# Patient Record
Sex: Female | Born: 1960 | Race: Black or African American | Hispanic: No | Marital: Single | Smoking: Never smoker
Health system: Southern US, Community
[De-identification: ages and names within clinical notes are randomized; demographics above are authoritative.]

## PROBLEM LIST (undated history)

## (undated) DIAGNOSIS — G629 Polyneuropathy, unspecified: Secondary | ICD-10-CM

## (undated) DIAGNOSIS — R569 Unspecified convulsions: Secondary | ICD-10-CM

## (undated) DIAGNOSIS — F419 Anxiety disorder, unspecified: Secondary | ICD-10-CM

## (undated) DIAGNOSIS — F061 Catatonic disorder due to known physiological condition: Secondary | ICD-10-CM

## (undated) DIAGNOSIS — M797 Fibromyalgia: Secondary | ICD-10-CM

## (undated) DIAGNOSIS — I10 Essential (primary) hypertension: Secondary | ICD-10-CM

## (undated) HISTORY — PX: ABDOMINAL HYSTERECTOMY: SHX81

## (undated) HISTORY — PX: GASTRIC BYPASS: SHX52

---

## 2011-04-08 ENCOUNTER — Ambulatory Visit: Payer: Self-pay | Admitting: Internal Medicine

## 2011-06-01 ENCOUNTER — Emergency Department (HOSPITAL_COMMUNITY): Payer: BC Managed Care – PPO

## 2011-06-01 ENCOUNTER — Encounter: Payer: Self-pay | Admitting: *Deleted

## 2011-06-01 ENCOUNTER — Emergency Department (HOSPITAL_COMMUNITY)
Admission: EM | Admit: 2011-06-01 | Discharge: 2011-06-02 | Disposition: A | Discharge status: Home / Self Care | Payer: BC Managed Care – PPO | Attending: Emergency Medicine | Admitting: Emergency Medicine

## 2011-06-01 DIAGNOSIS — Z79899 Other long term (current) drug therapy: Secondary | ICD-10-CM | POA: Insufficient documentation

## 2011-06-01 DIAGNOSIS — R0602 Shortness of breath: Secondary | ICD-10-CM | POA: Insufficient documentation

## 2011-06-01 DIAGNOSIS — R079 Chest pain, unspecified: Secondary | ICD-10-CM | POA: Insufficient documentation

## 2011-06-01 DIAGNOSIS — I10 Essential (primary) hypertension: Secondary | ICD-10-CM | POA: Insufficient documentation

## 2011-06-01 DIAGNOSIS — R109 Unspecified abdominal pain: Secondary | ICD-10-CM | POA: Insufficient documentation

## 2011-06-01 DIAGNOSIS — F411 Generalized anxiety disorder: Secondary | ICD-10-CM | POA: Insufficient documentation

## 2011-06-01 DIAGNOSIS — R059 Cough, unspecified: Secondary | ICD-10-CM | POA: Insufficient documentation

## 2011-06-01 DIAGNOSIS — Z9884 Bariatric surgery status: Secondary | ICD-10-CM | POA: Insufficient documentation

## 2011-06-01 DIAGNOSIS — R112 Nausea with vomiting, unspecified: Secondary | ICD-10-CM | POA: Insufficient documentation

## 2011-06-01 DIAGNOSIS — IMO0001 Reserved for inherently not codable concepts without codable children: Secondary | ICD-10-CM | POA: Insufficient documentation

## 2011-06-01 DIAGNOSIS — Z9889 Other specified postprocedural states: Secondary | ICD-10-CM | POA: Insufficient documentation

## 2011-06-01 DIAGNOSIS — R05 Cough: Secondary | ICD-10-CM | POA: Insufficient documentation

## 2011-06-01 HISTORY — DX: Polyneuropathy, unspecified: G62.9

## 2011-06-01 HISTORY — DX: Essential (primary) hypertension: I10

## 2011-06-01 HISTORY — DX: Fibromyalgia: M79.7

## 2011-06-01 HISTORY — DX: Anxiety disorder, unspecified: F41.9

## 2011-06-01 LAB — CBC
HCT: 31.9 % — ABNORMAL LOW (ref 36.0–46.0)
MCH: 31.4 pg (ref 26.0–34.0)
MCV: 92.7 fL (ref 78.0–100.0)
RBC: 3.44 MIL/uL — ABNORMAL LOW (ref 3.87–5.11)
WBC: 13 10*3/uL — ABNORMAL HIGH (ref 4.0–10.5)

## 2011-06-01 LAB — DIFFERENTIAL
Eosinophils Relative: 2 % (ref 0–5)
Lymphocytes Relative: 15 % (ref 12–46)
Lymphs Abs: 1.9 10*3/uL (ref 0.7–4.0)
Monocytes Absolute: 1.1 10*3/uL — ABNORMAL HIGH (ref 0.1–1.0)
Monocytes Relative: 8 % (ref 3–12)

## 2011-06-01 LAB — URINALYSIS, ROUTINE W REFLEX MICROSCOPIC
Bilirubin Urine: NEGATIVE
Glucose, UA: NEGATIVE mg/dL
Hgb urine dipstick: NEGATIVE
Ketones, ur: NEGATIVE mg/dL
Protein, ur: NEGATIVE mg/dL

## 2011-06-01 LAB — BASIC METABOLIC PANEL
BUN: 10 mg/dL (ref 6–23)
CO2: 27 mEq/L (ref 19–32)
Calcium: 9 mg/dL (ref 8.4–10.5)
Creatinine, Ser: 0.78 mg/dL (ref 0.50–1.10)
Glucose, Bld: 81 mg/dL (ref 70–99)

## 2011-06-01 LAB — LACTIC ACID, PLASMA: Lactic Acid, Venous: 1.8 mmol/L (ref 0.5–2.2)

## 2011-06-01 MED ORDER — DROPERIDOL 2.5 MG/ML IJ SOLN
2.5000 mg | INTRAMUSCULAR | Status: AC
  Administered 2011-06-01: 2.5 mg via INTRAVENOUS
  Filled 2011-06-01: qty 1

## 2011-06-01 MED ORDER — OXYCODONE-ACETAMINOPHEN 10-325 MG PO TABS: 1.0000 | ORAL_TABLET | ORAL | Status: AC | PRN

## 2011-06-01 MED ORDER — SODIUM CHLORIDE 0.9 % IV BOLUS (SEPSIS)
500.0000 mL | Freq: Once | INTRAVENOUS | Status: AC
  Administered 2011-06-01: 500 mL via INTRAVENOUS

## 2011-06-01 MED ORDER — ONDANSETRON HCL 4 MG/2ML IJ SOLN
4.0000 mg | Freq: Once | INTRAMUSCULAR | Status: AC
  Administered 2011-06-01: 4 mg via INTRAVENOUS
  Filled 2011-06-01: qty 2

## 2011-06-01 MED ORDER — HYDROMORPHONE HCL PF 1 MG/ML IJ SOLN
1.0000 mg | Freq: Once | INTRAMUSCULAR | Status: AC
  Administered 2011-06-01: 1 mg via INTRAVENOUS
  Filled 2011-06-01: qty 1

## 2011-06-01 NOTE — ED Provider Notes (Signed)
History     CSN: [id number]  Arrival date & time 06/01/11  1910   First MD Initiated Contact with Patient 06/01/11 2101      Chief Complaint  Patient presents with  . Abdominal Pain  . Nausea  . Emesis    (Consider location/radiation/quality/duration/timing/severity/associated sxs/prior treatment) HPI Pt reports had surgery on 12-19 to correct gastric bypass surgery. Pt reports she is having upper abdominal pain. States she unable to eat anything, states it comes right back up. Pt reports hx of fibromyalgia and she does not have any medications and states she is having bilateral upper extremity pain. Pt reports she is also having bilateral lower extremity pain, states hx of neuropathy.  Past Medical History  Diagnosis Date  . Peripheral neuropathy   . Fibromyalgia   . Hypertension   . Anxiety     Past Surgical History  Procedure Date  . Gastric bypass   . Abdominal hysterectomy     History reviewed. No pertinent family history.  History  Substance Use Topics  . Smoking status: Never Smoker   . Smokeless tobacco: Not on file  . Alcohol Use: No    OB History    Grav Para Term Preterm Abortions TAB SAB Ect Mult Living                  Review of Systems  All other systems reviewed and are negative.    Allergies  Morphine and related; Paxil; Penicillins; and Zoloft  Home Medications   Current Outpatient Rx  Name Route Sig Dispense Refill  . AMITRIPTYLINE HCL 25 MG PO TABS Oral Take 50 mg by mouth at bedtime.      . ATENOLOL 50 MG PO TABS Oral Take 50 mg by mouth daily.      [name] GABAPENTIN 800 MG PO TABS Oral Take 800 mg by mouth 3 (three) times daily.      . MEGESTROL ACETATE 40 MG/ML PO SUSP Oral Take 200 mg by mouth daily.      [name] PANTOPRAZOLE SODIUM 40 MG PO TBEC Oral Take 40 mg by mouth 2 (two) times daily.      [name] PROMETHAZINE HCL 12.5 MG PO TABS Oral Take 12.5 mg by mouth every 6 (six) hours as needed. For nausea     . OXYCODONE-ACETAMINOPHEN 10-325 MG  PO TABS Oral Take 1 tablet by mouth every 4 (four) hours as needed for pain. 30 tablet 0    BP 121/67  Pulse 131  Temp(Src) 99 F (37.2 C) (Oral)  Resp 18  SpO2 100%  Physical Exam  Nursing note and vitals reviewed. Constitutional: She is oriented to person, place, and time. She appears well-developed and well-nourished. No distress.  HENT:  Head: Normocephalic and atraumatic.  Eyes: Pupils are equal, round, and reactive to light.  Neck: Normal range of motion.  Cardiovascular: Normal rate and intact distal pulses.   Pulmonary/Chest: No respiratory distress.  Abdominal: Normal appearance. She exhibits no distension. There is tenderness. There is no rebound and no guarding.  Musculoskeletal: Normal range of motion.  Neurological: She is alert and oriented to person, place, and time. No cranial nerve deficit.  Skin: Skin is warm and dry. No rash noted.  Psychiatric: She has a normal mood and affect. Her behavior is normal.    ED Course  Procedures (including critical care time) Pt. Turned over to Dr. Dierdre Highman to review films and make disposition appropriately.    Labs Reviewed  CBC - Abnormal; Notable for  the following:    WBC 13.0 (*)    RBC 3.44 (*)    Hemoglobin 10.8 (*)    HCT 31.9 (*)    All other components within normal limits  DIFFERENTIAL - Abnormal; Notable for the following:    Neutro Abs 9.8 (*)    Monocytes Absolute 1.1 (*)    All other components within normal limits  BASIC METABOLIC PANEL  URINALYSIS, ROUTINE W REFLEX MICROSCOPIC  LACTIC ACID, PLASMA  LAB REPORT - SCANNED   IMPRESSION:   1.  Peripheral enhancing fluid collection in the left upper quadrant between the bypassed stomach and left hepatic lobe is likely a postsurgical fluid collection (hematoma, seroma or abscess).  No other fluid collections are identified. 2.  No evidence of bowel obstruction. 3.  No significant visceral organ findings. 4.  Small amount of free pelvic fluid.    1.  Abdominal pain       MDM          Nelia Shi, MD 06/04/11 1345

## 2011-06-01 NOTE — ED Notes (Addendum)
Pt reports had surgery on 12-19 to correct gastric bypass surgery. Pt reports she is having upper abdominal pain. States she unable to eat anything, states it comes right back up. Pt reports hx of fibromyalgia and she does not have any medications and states she is having bilateral upper extremity pain. Pt reports she is also having bilateral lower extremity pain, states hx of neuropathy.

## 2011-06-02 ENCOUNTER — Emergency Department (HOSPITAL_COMMUNITY): Payer: BC Managed Care – PPO

## 2011-06-02 ENCOUNTER — Encounter (HOSPITAL_COMMUNITY): Payer: Self-pay | Admitting: Radiology

## 2011-06-02 MED ORDER — IOHEXOL 300 MG/ML  SOLN
100.0000 mL | Freq: Once | INTRAMUSCULAR | Status: AC | PRN
  Administered 2011-06-02: 100 mL via INTRAVENOUS

## 2011-06-02 MED ORDER — IOHEXOL 300 MG/ML  SOLN
20.0000 mL | Freq: Once | INTRAMUSCULAR | Status: AC | PRN
  Administered 2011-06-02: 20 mL via ORAL

## 2011-06-02 NOTE — ED Provider Notes (Signed)
History     CSN: [id number]  Arrival date & time 06/01/11  1910   First MD Initiated Contact with Patient 06/01/11 2101      Chief Complaint  Patient presents with  . Abdominal Pain  . Nausea  . Emesis    (Consider location/radiation/quality/duration/timing/severity/associated sxs/prior treatment) HPI  Past Medical History  Diagnosis Date  . Peripheral neuropathy   . Fibromyalgia   . Hypertension   . Anxiety     Past Surgical History  Procedure Date  . Gastric bypass   . Abdominal hysterectomy     History reviewed. No pertinent family history.  History  Substance Use Topics  . Smoking status: Never Smoker   . Smokeless tobacco: Not on file  . Alcohol Use: No    OB History    Grav Para Term Preterm Abortions TAB SAB Ect Mult Living                  Review of Systems  Allergies  Morphine and related; Paxil; Penicillins; and Zoloft  Home Medications   Current Outpatient Rx  Name Route Sig Dispense Refill  . AMITRIPTYLINE HCL 25 MG PO TABS Oral Take 50 mg by mouth at bedtime.      . ATENOLOL 50 MG PO TABS Oral Take 50 mg by mouth daily.      [name] GABAPENTIN 800 MG PO TABS Oral Take 800 mg by mouth 3 (three) times daily.      . MEGESTROL ACETATE 40 MG/ML PO SUSP Oral Take 200 mg by mouth daily.      [name] PANTOPRAZOLE SODIUM 40 MG PO TBEC Oral Take 40 mg by mouth 2 (two) times daily.      [name] PROMETHAZINE HCL 12.5 MG PO TABS Oral Take 12.5 mg by mouth every 6 (six) hours as needed. For nausea     . OXYCODONE-ACETAMINOPHEN 10-325 MG PO TABS Oral Take 1 tablet by mouth every 4 (four) hours as needed for pain. 30 tablet 0    BP 121/67  Pulse 131  Temp(Src) 99 F (37.2 C) (Oral)  Resp 18  SpO2 100%  Physical Exam  ED Course  Procedures (including critical care time)  Labs Reviewed  CBC - Abnormal; Notable for the following:    WBC 13.0 (*)    RBC 3.44 (*)    Hemoglobin 10.8 (*)    HCT 31.9 (*)    All other components within normal limits  DIFFERENTIAL  - Abnormal; Notable for the following:    Neutro Abs 9.8 (*)    Monocytes Absolute 1.1 (*)    All other components within normal limits  BASIC METABOLIC PANEL  URINALYSIS, ROUTINE W REFLEX MICROSCOPIC  LACTIC ACID, PLASMA   Ct Abdomen Pelvis W Contrast  06/02/2011  *RADIOLOGY REPORT*  Clinical Data: Abdominal pain.  The patient reports surgery 05/18/2011 for revision of gastric bypass.  CT ABDOMEN AND PELVIS WITH CONTRAST  Technique:  Multidetector CT imaging of the abdomen and pelvis was performed following the standard protocol during bolus administration of intravenous contrast.  Contrast: OMNIPAQUE IOHEXOL 300 MG/ML IV SOLN  Comparison: Acute abdominal series 06/01/2011  Findings: Minimal nodularity is noted anterior to the left major fissure on image 1.  The lung bases are otherwise clear.  There is no pleural effusion.  There are postsurgical changes in the left upper quadrant consistent with gastric bypass.  The stomach demonstrates mild wall thickening.  There is an irregular fluid collection between the stomach and left hepatic  lobe which measures 3.4 x 2.2 cm on image 23.  This demonstrates irregular enhancing margins but no air bubbles or enteric contrast.  No other intra-abdominal fluid collections are seen.  The liver itself demonstrates no abnormalities.  There is minimal biliary prominence status post cholecystectomy.  The spleen, pancreas and adrenal glands appear normal.  The kidneys appear normal.  Moderate stool is present throughout the colon.  The appendix appears normal.  A small amount of free pelvic fluid is within physiologic limits.  There is no adnexal mass status post hysterectomy.  The bladder appears normal.  There are no acute osseous findings.  Mild facet degenerative changes are present within the lower lumbar spine.  There are paraspinal osteophytes in the lower thoracic spine.  IMPRESSION:  1.  Peripheral enhancing fluid collection in the left upper quadrant between  the bypassed stomach and left hepatic lobe is likely a postsurgical fluid collection (hematoma, seroma or abscess).  No other fluid collections are identified. 2.  No evidence of bowel obstruction. 3.  No significant visceral organ findings. 4.  Small amount of free pelvic fluid.  Original Report Authenticated By: Gerrianne Scale, M.D.   Dg Abd Acute W/chest  06/01/2011  *RADIOLOGY REPORT*  Clinical Data: Shortness of breath, chest pain, cough, and abdominal pain for 3 days.  Abdominal pain, vomiting, and diarrhea since the abdominal surgery on December 19.  ACUTE ABDOMEN SERIES (ABDOMEN 2 VIEW & CHEST 1 VIEW)  Comparison: None.  Findings: The heart size and pulmonary vascularity are normal. The lungs appear clear and expanded without focal air space disease or consolidation. No blunting of the costophrenic angles.  Stool filled colon without distension.  Scattered gas within nondistended small bowel loops, likely to represent ileus.  No significant small bowel dilatation.  No evidence of free intra- abdominal air.  There is a small rounded gas collection in the right upper quadrant with a small air-fluid level, likely representing a duodenal bulb.  Surgical clips in the right upper quadrant and epigastric region.  Degenerative changes in the lumbar spine and hips.  IMPRESSION: Postoperative changes in the upper abdomen.  Scattered gas in nondistended small bowel probably representing ileus.  No evidence of obstruction or free air.  Original Report Authenticated By: Marlon Pel, M.D.     1. Abdominal pain       MDM   I assumed patient care at 12 AM from Dr. Radford Pax pending CT scan.   Brief history: Patient had gastric bypass done remote past and had a surgical revision performed 05/18/2011. Since 05/24/2011 she's had persistent pain and vomiting after everything she eats. Her surgery was performed at [facility] in [place]. She is living here with family in [place] currently. She spoke  with the PA per surgeon today who recommended she come in emergency department. She says she has felt feverish but no recorded temperature. No vomiting in the emergency department.  On exam she is mildly tender in the epigastric region. She has some healing lap scars consistent with reported surgery. No abdominal peritonitis.  No erythema or ecchymosis abdomen. Mildly dry mucous membranes. Mild tachycardia. Normal lung sounds.  2:56 AM CT scan reviewed as above. I discussed the case as above with Dr. Donell Beers on call for general surgery who recommends consultation with patient surgeon in [place].   3:38 AM Case discussed with Dr. Geraldo Pitter at [facility] on call for Dr. Cheryle Horsfall, who performed patient surgery at [facility]. He reviewed the patient's record with  me. She had a reversal of her gastric bypass. She had a swallow study afterwards with no leak. She does have a psych history. Of note she has no psychosis or active psychiatric issues at this time. Dr. Geraldo Pitter recommended that she is able to tolerate fluids and does not have acute abdomen at this time and follow up with Dr. Cheryle Horsfall this week. [facility] is currently without beds, but would accept in transfer when able if patient needs to be transferred. Patient is scheduled to followup with her surgeon this week.  I discussed this as above with the patient. She tolerates by mouth water no acute distress. She has minimal symptoms at this time. She agrees to followup this week. Has no peritonitis on serial exams. Given her clinical condition at this time, I feel she can be safely discharged home with pain medications as prescribed by Dr. Radford Pax and followup this week in clinic. Her son is bedside agrees to all discharge and followup instructions and patient agrees as well. Return precautions verbalized is understood.        Sunnie Nielsen, MD 06/02/11 [phone]

## 2011-06-02 NOTE — ED Notes (Signed)
Patient transported to CT 

## 2011-06-02 NOTE — ED Notes (Signed)
Patient returned from CT

## 2012-02-22 ENCOUNTER — Ambulatory Visit: Payer: BC Managed Care – PPO | Attending: Neurology | Admitting: *Deleted

## 2012-02-22 DIAGNOSIS — IMO0001 Reserved for inherently not codable concepts without codable children: Secondary | ICD-10-CM | POA: Insufficient documentation

## 2012-02-22 DIAGNOSIS — R269 Unspecified abnormalities of gait and mobility: Secondary | ICD-10-CM | POA: Insufficient documentation

## 2012-02-22 DIAGNOSIS — M6281 Muscle weakness (generalized): Secondary | ICD-10-CM | POA: Insufficient documentation

## 2012-02-29 ENCOUNTER — Ambulatory Visit: Payer: BC Managed Care – PPO | Attending: Neurology | Admitting: *Deleted

## 2012-02-29 DIAGNOSIS — R269 Unspecified abnormalities of gait and mobility: Secondary | ICD-10-CM | POA: Insufficient documentation

## 2012-02-29 DIAGNOSIS — M6281 Muscle weakness (generalized): Secondary | ICD-10-CM | POA: Insufficient documentation

## 2012-02-29 DIAGNOSIS — IMO0001 Reserved for inherently not codable concepts without codable children: Secondary | ICD-10-CM | POA: Insufficient documentation

## 2012-03-12 ENCOUNTER — Ambulatory Visit: Payer: BC Managed Care – PPO | Admitting: Physical Therapy

## 2012-03-16 ENCOUNTER — Ambulatory Visit: Payer: BC Managed Care – PPO | Admitting: *Deleted

## 2012-03-21 ENCOUNTER — Ambulatory Visit: Payer: BC Managed Care – PPO | Admitting: *Deleted

## 2012-03-23 ENCOUNTER — Ambulatory Visit: Payer: BC Managed Care – PPO | Admitting: *Deleted

## 2012-03-26 ENCOUNTER — Ambulatory Visit: Payer: BC Managed Care – PPO | Admitting: *Deleted

## 2012-03-28 ENCOUNTER — Ambulatory Visit: Payer: BC Managed Care – PPO | Admitting: *Deleted

## 2012-03-30 ENCOUNTER — Ambulatory Visit: Payer: BC Managed Care – PPO | Admitting: *Deleted

## 2012-04-04 ENCOUNTER — Ambulatory Visit: Payer: BC Managed Care – PPO | Admitting: *Deleted

## 2012-04-09 ENCOUNTER — Ambulatory Visit: Payer: BC Managed Care – PPO | Attending: Neurology | Admitting: *Deleted

## 2012-04-09 DIAGNOSIS — IMO0001 Reserved for inherently not codable concepts without codable children: Secondary | ICD-10-CM | POA: Insufficient documentation

## 2012-04-09 DIAGNOSIS — R269 Unspecified abnormalities of gait and mobility: Secondary | ICD-10-CM | POA: Insufficient documentation

## 2012-04-09 DIAGNOSIS — M6281 Muscle weakness (generalized): Secondary | ICD-10-CM | POA: Insufficient documentation

## 2012-04-11 ENCOUNTER — Ambulatory Visit: Payer: BC Managed Care – PPO | Admitting: *Deleted

## 2012-04-13 ENCOUNTER — Ambulatory Visit: Payer: BC Managed Care – PPO | Admitting: *Deleted

## 2012-04-16 ENCOUNTER — Ambulatory Visit: Payer: BC Managed Care – PPO | Admitting: *Deleted

## 2012-04-17 ENCOUNTER — Other Ambulatory Visit: Payer: Self-pay | Admitting: Internal Medicine

## 2012-04-17 DIAGNOSIS — Z1231 Encounter for screening mammogram for malignant neoplasm of breast: Secondary | ICD-10-CM

## 2012-04-20 ENCOUNTER — Ambulatory Visit: Payer: BC Managed Care – PPO | Admitting: *Deleted

## 2012-04-23 ENCOUNTER — Ambulatory Visit: Payer: BC Managed Care – PPO | Admitting: Physical Therapy

## 2012-04-23 ENCOUNTER — Ambulatory Visit: Payer: BC Managed Care – PPO

## 2012-04-24 ENCOUNTER — Ambulatory Visit
Admission: RE | Admit: 2012-04-24 | Discharge: 2012-04-24 | Disposition: A | Discharge status: Home / Self Care | Payer: BC Managed Care – PPO | Source: Ambulatory Visit | Attending: Internal Medicine | Admitting: Internal Medicine

## 2012-04-24 DIAGNOSIS — Z1231 Encounter for screening mammogram for malignant neoplasm of breast: Secondary | ICD-10-CM

## 2012-04-25 ENCOUNTER — Ambulatory Visit: Payer: BC Managed Care – PPO | Admitting: *Deleted

## 2012-05-01 ENCOUNTER — Ambulatory Visit: Payer: BC Managed Care – PPO | Admitting: *Deleted

## 2012-05-02 ENCOUNTER — Ambulatory Visit: Payer: BC Managed Care – PPO | Attending: Neurology | Admitting: Physical Therapy

## 2012-05-02 DIAGNOSIS — IMO0001 Reserved for inherently not codable concepts without codable children: Secondary | ICD-10-CM | POA: Insufficient documentation

## 2012-05-02 DIAGNOSIS — M6281 Muscle weakness (generalized): Secondary | ICD-10-CM | POA: Insufficient documentation

## 2012-05-02 DIAGNOSIS — R269 Unspecified abnormalities of gait and mobility: Secondary | ICD-10-CM | POA: Insufficient documentation

## 2012-05-04 ENCOUNTER — Ambulatory Visit: Payer: BC Managed Care – PPO | Admitting: *Deleted

## 2012-05-09 ENCOUNTER — Ambulatory Visit: Payer: BC Managed Care – PPO | Admitting: *Deleted

## 2012-05-11 ENCOUNTER — Ambulatory Visit: Payer: BC Managed Care – PPO | Admitting: *Deleted

## 2012-05-14 ENCOUNTER — Ambulatory Visit: Payer: BC Managed Care – PPO | Admitting: *Deleted

## 2012-05-18 ENCOUNTER — Ambulatory Visit: Payer: BC Managed Care – PPO | Admitting: *Deleted

## 2012-05-28 ENCOUNTER — Ambulatory Visit: Payer: BC Managed Care – PPO | Admitting: Physical Therapy

## 2012-05-29 ENCOUNTER — Ambulatory Visit: Payer: BC Managed Care – PPO | Admitting: Physical Therapy

## 2012-06-04 ENCOUNTER — Ambulatory Visit: Payer: BC Managed Care – PPO | Attending: Neurology | Admitting: Physical Therapy

## 2012-06-04 DIAGNOSIS — IMO0001 Reserved for inherently not codable concepts without codable children: Secondary | ICD-10-CM | POA: Insufficient documentation

## 2012-06-04 DIAGNOSIS — M6281 Muscle weakness (generalized): Secondary | ICD-10-CM | POA: Insufficient documentation

## 2012-06-04 DIAGNOSIS — R269 Unspecified abnormalities of gait and mobility: Secondary | ICD-10-CM | POA: Insufficient documentation

## 2012-06-06 ENCOUNTER — Ambulatory Visit: Payer: BC Managed Care – PPO | Admitting: Physical Therapy

## 2012-08-21 ENCOUNTER — Telehealth: Payer: Self-pay | Admitting: Neurology

## 2012-08-21 NOTE — Telephone Encounter (Addendum)
Pt calling about her forms. I have not seen any forms with her name come at my desk.   Will forward message to MR. I called and spoke to pt and also the [employer] about her case. See other note.

## 2012-10-02 ENCOUNTER — Telehealth: Payer: Self-pay

## 2012-10-02 ENCOUNTER — Telehealth: Payer: Self-pay | Admitting: Neurology

## 2012-10-02 MED ORDER — GABAPENTIN 300 MG PO CAPS: 900.0000 mg | ORAL_CAPSULE | Freq: Three times a day (TID) | ORAL | Status: DC

## 2012-10-02 MED ORDER — TOPIRAMATE 50 MG PO TABS: 100.0000 mg | ORAL_TABLET | Freq: Two times a day (BID) | ORAL | Status: DC

## 2012-10-02 NOTE — Telephone Encounter (Signed)
Rx has been sent.  I called patient.  She is aware rx was sent.

## 2012-10-02 NOTE — Telephone Encounter (Signed)
Patient called back requesting her Topamax be sent to [facility]

## 2012-10-16 ENCOUNTER — Ambulatory Visit: Payer: Self-pay | Admitting: Neurology

## 2012-10-31 ENCOUNTER — Telehealth: Payer: Self-pay | Admitting: *Deleted

## 2012-11-05 ENCOUNTER — Ambulatory Visit: Payer: Self-pay | Admitting: Neurology

## 2013-03-06 ENCOUNTER — Ambulatory Visit: Payer: Self-pay | Admitting: Neurology

## 2013-04-05 ENCOUNTER — Telehealth: Payer: Self-pay | Admitting: *Deleted

## 2013-04-05 NOTE — Telephone Encounter (Signed)
I tried calling the Joan [name] to let her know her appointment has been cancelled..No one answered Ieft voice mail letting Joan [name] know her appointment was cancelled. So if she calls in please schedule her for Dr. Pearlean Brownie.

## 2013-04-17 ENCOUNTER — Ambulatory Visit: Payer: Self-pay | Admitting: Neurology

## 2013-05-08 ENCOUNTER — Ambulatory Visit: Payer: Self-pay | Admitting: Neurology

## 2013-05-11 ENCOUNTER — Emergency Department (HOSPITAL_COMMUNITY): Payer: Medicare Other

## 2013-05-11 ENCOUNTER — Encounter (HOSPITAL_COMMUNITY): Payer: Self-pay | Admitting: Emergency Medicine

## 2013-05-11 ENCOUNTER — Inpatient Hospital Stay (HOSPITAL_COMMUNITY)
Admission: EM | Admit: 2013-05-11 | Discharge: 2013-05-27 | DRG: 871 | Disposition: A | Discharge status: Skilled Nursing Facility | Payer: Medicare Other | Attending: Internal Medicine | Admitting: Internal Medicine

## 2013-05-11 DIAGNOSIS — A419 Sepsis, unspecified organism: Principal | ICD-10-CM

## 2013-05-11 DIAGNOSIS — R4182 Altered mental status, unspecified: Secondary | ICD-10-CM

## 2013-05-11 DIAGNOSIS — G0490 Encephalitis and encephalomyelitis, unspecified: Secondary | ICD-10-CM

## 2013-05-11 DIAGNOSIS — E870 Hyperosmolality and hypernatremia: Secondary | ICD-10-CM

## 2013-05-11 DIAGNOSIS — N12 Tubulo-interstitial nephritis, not specified as acute or chronic: Secondary | ICD-10-CM | POA: Diagnosis present

## 2013-05-11 DIAGNOSIS — I959 Hypotension, unspecified: Secondary | ICD-10-CM | POA: Diagnosis not present

## 2013-05-11 DIAGNOSIS — Z9884 Bariatric surgery status: Secondary | ICD-10-CM

## 2013-05-11 DIAGNOSIS — E46 Unspecified protein-calorie malnutrition: Secondary | ICD-10-CM | POA: Diagnosis present

## 2013-05-11 DIAGNOSIS — G9340 Encephalopathy, unspecified: Secondary | ICD-10-CM

## 2013-05-11 DIAGNOSIS — A048 Other specified bacterial intestinal infections: Secondary | ICD-10-CM

## 2013-05-11 DIAGNOSIS — Z9181 History of falling: Secondary | ICD-10-CM

## 2013-05-11 DIAGNOSIS — R569 Unspecified convulsions: Secondary | ICD-10-CM

## 2013-05-11 DIAGNOSIS — I10 Essential (primary) hypertension: Secondary | ICD-10-CM | POA: Diagnosis present

## 2013-05-11 DIAGNOSIS — G929 Unspecified toxic encephalopathy: Secondary | ICD-10-CM | POA: Diagnosis present

## 2013-05-11 DIAGNOSIS — Z79899 Other long term (current) drug therapy: Secondary | ICD-10-CM

## 2013-05-11 DIAGNOSIS — A0472 Enterocolitis due to Clostridium difficile, not specified as recurrent: Secondary | ICD-10-CM | POA: Diagnosis not present

## 2013-05-11 DIAGNOSIS — E872 Acidosis, unspecified: Secondary | ICD-10-CM | POA: Diagnosis present

## 2013-05-11 DIAGNOSIS — Z23 Encounter for immunization: Secondary | ICD-10-CM

## 2013-05-11 DIAGNOSIS — G92 Toxic encephalopathy: Secondary | ICD-10-CM | POA: Diagnosis present

## 2013-05-11 DIAGNOSIS — E860 Dehydration: Secondary | ICD-10-CM

## 2013-05-11 DIAGNOSIS — E876 Hypokalemia: Secondary | ICD-10-CM | POA: Diagnosis not present

## 2013-05-11 DIAGNOSIS — N179 Acute kidney failure, unspecified: Secondary | ICD-10-CM

## 2013-05-11 DIAGNOSIS — N390 Urinary tract infection, site not specified: Secondary | ICD-10-CM

## 2013-05-11 DIAGNOSIS — K5900 Constipation, unspecified: Secondary | ICD-10-CM | POA: Diagnosis not present

## 2013-05-11 DIAGNOSIS — IMO0001 Reserved for inherently not codable concepts without codable children: Secondary | ICD-10-CM | POA: Diagnosis present

## 2013-05-11 HISTORY — DX: Catatonic disorder due to known physiological condition: F06.1

## 2013-05-11 LAB — URINALYSIS, ROUTINE W REFLEX MICROSCOPIC
Glucose, UA: NEGATIVE mg/dL
Hgb urine dipstick: NEGATIVE
Protein, ur: NEGATIVE mg/dL
Specific Gravity, Urine: 1.024 (ref 1.005–1.030)
Urobilinogen, UA: 1 mg/dL (ref 0.0–1.0)

## 2013-05-11 LAB — BLOOD GAS, ARTERIAL
Drawn by: 40405
FIO2: 0.21 %
O2 Saturation: 99.1 %
Patient temperature: 98.6
pH, Arterial: 7.433 (ref 7.350–7.450)
pO2, Arterial: 160 mmHg — ABNORMAL HIGH (ref 80.0–100.0)

## 2013-05-11 LAB — POCT I-STAT, CHEM 8
BUN: 51 mg/dL — ABNORMAL HIGH (ref 6–23)
Calcium, Ion: 1.09 mmol/L — ABNORMAL LOW (ref 1.12–1.23)
Chloride: 111 mEq/L (ref 96–112)
Creatinine, Ser: 2 mg/dL — ABNORMAL HIGH (ref 0.50–1.10)
HCT: 42 % (ref 36.0–46.0)
Sodium: 149 mEq/L — ABNORMAL HIGH (ref 135–145)
TCO2: 20 mmol/L (ref 0–100)

## 2013-05-11 LAB — CBC
HCT: 41.9 % (ref 36.0–46.0)
MCH: 30.7 pg (ref 26.0–34.0)
MCHC: 34.1 g/dL (ref 30.0–36.0)
MCV: 89.9 fL (ref 78.0–100.0)
RBC: 4.66 MIL/uL (ref 3.87–5.11)
RDW: 13.6 % (ref 11.5–15.5)
WBC: 8.8 10*3/uL (ref 4.0–10.5)

## 2013-05-11 LAB — URINE MICROSCOPIC-ADD ON

## 2013-05-11 LAB — POCT I-STAT TROPONIN I

## 2013-05-11 LAB — COMPREHENSIVE METABOLIC PANEL
AST: 38 U/L — ABNORMAL HIGH (ref 0–37)
Albumin: 4 g/dL (ref 3.5–5.2)
BUN: 55 mg/dL — ABNORMAL HIGH (ref 6–23)
CO2: 20 mEq/L (ref 19–32)
Calcium: 9.7 mg/dL (ref 8.4–10.5)
Creatinine, Ser: 1.8 mg/dL — ABNORMAL HIGH (ref 0.50–1.10)
GFR calc non Af Amer: 31 mL/min — ABNORMAL LOW (ref >90)
Total Protein: 8.6 g/dL — ABNORMAL HIGH (ref 6.0–8.3)

## 2013-05-11 LAB — RAPID URINE DRUG SCREEN, HOSP PERFORMED
Amphetamines: NOT DETECTED
Cocaine: NOT DETECTED
Opiates: NOT DETECTED
Tetrahydrocannabinol: NOT DETECTED

## 2013-05-11 LAB — DIFFERENTIAL
Basophils Absolute: 0 10*3/uL (ref 0.0–0.1)
Basophils Relative: 0 % (ref 0–1)
Eosinophils Relative: 0 % (ref 0–5)
Monocytes Absolute: 1.3 10*3/uL — ABNORMAL HIGH (ref 0.1–1.0)
Monocytes Relative: 15 % — ABNORMAL HIGH (ref 3–12)

## 2013-05-11 LAB — ETHANOL: Alcohol, Ethyl (B): <11 mg/dL (ref 0–11)

## 2013-05-11 LAB — GLUCOSE, CAPILLARY: Glucose-Capillary: 116 mg/dL — ABNORMAL HIGH (ref 70–99)

## 2013-05-11 LAB — LACTIC ACID, PLASMA: Lactic Acid, Venous: 5 mmol/L — ABNORMAL HIGH (ref 0.5–2.2)

## 2013-05-11 LAB — TROPONIN I: Troponin I: <0.3 ng/mL (ref <0.30)

## 2013-05-11 MED ORDER — SODIUM CHLORIDE 0.9 % IV BOLUS (SEPSIS)
2000.0000 mL | Freq: Once | INTRAVENOUS | Status: AC
  Administered 2013-05-11: 2000 mL via INTRAVENOUS

## 2013-05-11 MED ORDER — ACETAMINOPHEN 650 MG RE SUPP: 650.0000 mg | Freq: Four times a day (QID) | RECTAL | Status: DC | PRN

## 2013-05-11 MED ORDER — SODIUM CHLORIDE 0.9 % IV SOLN
INTRAVENOUS | Status: DC
  Administered 2013-05-11 – 2013-05-12 (×2): via INTRAVENOUS

## 2013-05-11 MED ORDER — OXYCODONE HCL 5 MG PO TABS: 5.0000 mg | ORAL_TABLET | ORAL | Status: DC | PRN

## 2013-05-11 MED ORDER — PANTOPRAZOLE SODIUM 40 MG IV SOLR
40.0000 mg | Freq: Two times a day (BID) | INTRAVENOUS | Status: DC
  Administered 2013-05-11 – 2013-05-18 (×14): 40 mg via INTRAVENOUS
  Filled 2013-05-11 (×19): qty 40

## 2013-05-11 MED ORDER — ONDANSETRON HCL 4 MG PO TABS: 4.0000 mg | ORAL_TABLET | Freq: Four times a day (QID) | ORAL | Status: DC | PRN

## 2013-05-11 MED ORDER — SODIUM CHLORIDE 0.9 % IV SOLN: INTRAVENOUS | Status: DC

## 2013-05-11 MED ORDER — ONDANSETRON HCL 4 MG/2ML IJ SOLN: 4.0000 mg | Freq: Four times a day (QID) | INTRAMUSCULAR | Status: DC | PRN

## 2013-05-11 MED ORDER — ACETAMINOPHEN 325 MG PO TABS: 650.0000 mg | ORAL_TABLET | Freq: Four times a day (QID) | ORAL | Status: DC | PRN

## 2013-05-11 MED ORDER — ENOXAPARIN SODIUM 30 MG/0.3ML ~~LOC~~ SOLN
30.0000 mg | SUBCUTANEOUS | Status: DC
  Administered 2013-05-12 – 2013-05-13 (×2): 30 mg via SUBCUTANEOUS
  Filled 2013-05-11 (×2): qty 0.3

## 2013-05-11 MED ORDER — ZOLPIDEM TARTRATE 5 MG PO TABS: 5.0000 mg | ORAL_TABLET | Freq: Every evening | ORAL | Status: DC | PRN

## 2013-05-11 MED ORDER — SODIUM CHLORIDE 0.9 % IJ SOLN
INTRAMUSCULAR | Status: AC
  Filled 2013-05-11: qty 10

## 2013-05-11 MED ORDER — ALUM & MAG HYDROXIDE-SIMETH 200-200-20 MG/5ML PO SUSP: 30.0000 mL | Freq: Four times a day (QID) | ORAL | Status: DC | PRN

## 2013-05-11 MED ORDER — SODIUM CHLORIDE 0.9 % IV SOLN
INTRAVENOUS | Status: DC
  Administered 2013-05-11 – 2013-05-13 (×3): via INTRAVENOUS

## 2013-05-11 MED ORDER — DEXTROSE 5 % IV SOLN
1.0000 g | Freq: Once | INTRAVENOUS | Status: AC
  Administered 2013-05-11: 1 g via INTRAVENOUS
  Filled 2013-05-11: qty 10

## 2013-05-11 MED ORDER — DEXTROSE 5 % IV SOLN
1.0000 g | INTRAVENOUS | Status: DC
  Administered 2013-05-12 – 2013-05-17 (×4): 1 g via INTRAVENOUS
  Filled 2013-05-11 (×8): qty 10

## 2013-05-11 MED ORDER — HYDROMORPHONE HCL PF 1 MG/ML IJ SOLN
0.5000 mg | INTRAMUSCULAR | Status: DC | PRN
  Administered 2013-05-16 – 2013-05-17 (×2): 0.5 mg via INTRAVENOUS
  Administered 2013-05-27: 1 mg via INTRAVENOUS
  Filled 2013-05-11 (×3): qty 1

## 2013-05-11 MED ORDER — ACETAMINOPHEN 650 MG RE SUPP
975.0000 mg | Freq: Once | RECTAL | Status: AC
  Administered 2013-05-11: 975 mg via RECTAL
  Filled 2013-05-11 (×2): qty 1

## 2013-05-11 NOTE — ED Notes (Signed)
Patient presents to ED via EMS from home. Family called EMS due to altered mental status for a couple days per EMS.

## 2013-05-11 NOTE — ED Notes (Signed)
Admitting at bedside 

## 2013-05-11 NOTE — H&P (Addendum)
[facility] History and Physical  Joan [name] RUE:[id number] DOB: 1960/10/24 DOA: 05/11/2013  Referring physician:  EDP PCP: No primary provider on file.  Specialists:   Chief Complaint:  Decreased Responsiveness  HPI: Joan [name] is a 52 y.o. female who was brought to the ED after having decreased responsiveness for the past 2-3 days.  She is unresponsive at this time and her son gives the history.  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  He states that she has not had any food or liquids during this time as well.   She was diagnosed with a UTI on 04/05/2013 and was prescribed Bactrim therapy but she did not take the medication due to increased nausea and vomiting.   Her son also reports that prior to this she had been falling down a lot and has multiple bruises from her falls.      In the ED, she was evaluated and found to be minimally responsive and febrile.   She was found to have a UTI, and and increased lactic Acid level of 5, and was placed on IV Rocephin and IVFs for rehydration.  A CT scan of the head was performed and was found to be negative for acute findings.   She was referred for admission.      Review of Systems: The patient denies anorexia, fever, chills, headaches, weight loss, vision loss, diplopia, dizziness, decreased hearing, rhinitis, hoarseness, chest pain, syncope, dyspnea on exertion, peripheral edema, balance deficits, cough, hemoptysis, abdominal pain, nausea, vomiting, diarrhea, constipation, hematemesis, melena, hematochezia, severe indigestion/heartburn, dysuria, hematuria, incontinence, muscle weakness, suspicious skin lesions, transient blindness, difficulty walking, depression, unusual weight change, abnormal bleeding, enlarged lymph nodes, angioedema, and breast masses.    Past Medical History  Diagnosis Date  . Peripheral neuropathy   . Fibromyalgia   . Hypertension   . Anxiety   . Catatonia      Past Surgical History  Procedure Laterality Date  . Gastric bypass    . Abdominal hysterectomy      Prior to Admission medications   Medication Sig Start Date End Date Taking? Authorizing Provider  amitriptyline (ELAVIL) 25 MG tablet Take 50 mg by mouth at bedtime.     Yes Historical Provider, MD  atenolol (TENORMIN) 50 MG tablet Take 50 mg by mouth daily.     Yes Historical Provider, MD  buPROPion (WELLBUTRIN XL) 150 MG 24 hr tablet Take 150 mg by mouth every morning.   Yes Historical Provider, MD  buPROPion (WELLBUTRIN XL) 300 MG 24 hr tablet Take 300 mg by mouth daily.   Yes Historical Provider, MD  clonazePAM (KLONOPIN) 1 MG tablet Take 0.5-1 mg by mouth 2 (two) times daily. Takes 1mg  in the morning and 0.5mg  midday   Yes Historical Provider, MD  dimenhyDRINATE (DRAMAMINE) 50 MG tablet Take 50 mg by mouth every 8 (eight) hours as needed for nausea or dizziness.   Yes Historical Provider, MD  diphenhydrAMINE (BENADRYL) 25 mg capsule Take 25 mg by mouth every 6 (six) hours as needed for allergies.   Yes Historical Provider, MD  doxylamine, Sleep, (UNISOM) 25 MG tablet Take 25 mg by mouth at bedtime as needed for sleep.   Yes Historical Provider, MD  fluticasone (CUTIVATE) 0.05 % cream Apply 1 application topically 2 (two) times daily.   Yes Historical Provider, MD  hydrocortisone 2.5 % ointment Apply 1 application topically 2 (two) times daily as needed (for itching).  Yes Historical Provider, MD  hyoscyamine (LEVBID) 0.375 MG 12 hr tablet Take 0.375 mg by mouth 2 (two) times daily.   Yes Historical Provider, MD  loperamide (IMODIUM) 2 MG capsule Take 2 mg by mouth daily as needed for diarrhea or loose stools.   Yes Historical Provider, MD  pantoprazole (PROTONIX) 40 MG tablet Take 40 mg by mouth daily.    Yes Historical Provider, MD  promethazine (PHENERGAN) 25 MG tablet Take 25 mg by mouth every 4 (four) hours as needed for nausea or vomiting.   Yes Historical Provider, MD   sulfamethoxazole-trimethoprim (BACTRIM DS) 800-160 MG per tablet Take 1 tablet by mouth 2 (two) times daily.   Yes Historical Provider, MD  temazepam (RESTORIL) 15 MG capsule Take 15 mg by mouth at bedtime.   Yes Historical Provider, MD  topiramate (TOPAMAX) 50 MG tablet Take 100 mg by mouth 2 (two) times daily.   Yes Historical Provider, MD  gabapentin (NEURONTIN) 300 MG capsule Take 3 capsules (900 mg total) by mouth 3 (three) times daily. 10/02/12   Micki Riley, MD  megestrol (MEGACE) 40 MG/ML suspension Take 200 mg by mouth daily.      Historical Provider, MD    Allergies  Allergen Reactions  . Morphine And Related     Causes pain  . Paroxetine Hcl   . Penicillins Other (See Comments)    Makes her body hurt.  . Sertraline Hcl     Social History:  reports that she has never smoked. She does not have any smokeless tobacco history on file. She reports that she does not drink alcohol. Her drug history is not on file.     Family History  Problem Relation Age of Onset  . Hypertension Mother   . Diabetes Father      Physical Exam:  GEN: Obtunded Obese 52 y.o. African American  female  examined  and in no acute distress; Filed Vitals:   05/11/13 1710 05/11/13 1915 05/11/13 2000  BP: 89/68 129/93 107/80  Pulse: 124 110   Temp: 101.4 F (38.6 C)    TempSrc: Rectal    Resp: 19 32 27  SpO2: 100% 100%    Blood pressure 107/80, pulse 110, temperature 101.4 F (38.6 C), temperature source Rectal, resp. rate 27, SpO2 100.00%. PSYCH: She is alert and oriented x1; Minimally responsive at this time HEENT: Normocephalic and Atraumatic, Mucous membranes pink; PERRLA; EOM intact; Fundi:  Benign;  No scleral icterus, Nares: Patent, Oropharynx:  Dry,  or Fair Dentition, Neck:  FROM, no cervical lymphadenopathy nor thyromegaly or carotid bruit; no JVD; Breasts:: Not examined CHEST WALL: No tenderness CHEST: Normal respiration, clear to auscultation bilaterally HEART: Regular rate and  rhythm; no murmurs rubs or gallops BACK: No kyphosis or scoliosis; no CVA tenderness ABDOMEN: Positive Bowel Sounds, Obese, soft non-tender; no masses, no organomegaly. Rectal Exam: Not done EXTREMITIES: No cyanosis, clubbing or edema; no ulcerations. Genitalia: not examined PULSES: 2+ and symmetric SKIN: Normal hydration no rash or ulceration CNS: Obtunded,  Cranial nerves 2-12 grossly intact,  Minimally responsive to verbal and tactile stimuli.       Labs on Admission:  Basic Metabolic Panel:  Recent Labs Lab 05/11/13 1755 05/11/13 1802  NA 149* 149*  K 3.6 3.5  CL 107 111  CO2 20  --   GLUCOSE 118* 120*  BUN 55* 51*  CREATININE 1.80* 2.00*  CALCIUM 9.7  --    Liver Function Tests:  Recent Labs Lab 05/11/13 1755  AST 38*  ALT 29  ALKPHOS 53  BILITOT 1.4*  PROT 8.6*  ALBUMIN 4.0   No results found for this basename: LIPASE, AMYLASE,  in the last 168 hours No results found for this basename: AMMONIA,  in the last 168 hours CBC:  Recent Labs Lab 05/11/13 1755 05/11/13 1802  WBC 8.8  --   NEUTROABS 6.1  --   HGB 14.3 14.3  HCT 41.9 42.0  MCV 89.9  --   PLT 53*  --    Cardiac Enzymes:  Recent Labs Lab 05/11/13 1755  TROPONINI <0.30    BNP (last 3 results) No results found for this basename: PROBNP,  in the last 8760 hours CBG:  Recent Labs Lab 05/11/13 1750  GLUCAP 116*    Radiological Exams on Admission: Ct Head Wo Contrast  05/11/2013   CLINICAL DATA:  Altered mental status.  Bruises all over body.  EXAM: CT HEAD WITHOUT CONTRAST  TECHNIQUE: Contiguous axial images were obtained from the base of the skull through the vertex without contrast.  COMPARISON:  None  FINDINGS: Premature for age cerebral and cerebellar atrophy. Hypoattenuation of white matter representing chronic microvascular ischemic change. Calvarium intact. No sinus or mastoid fluid. Scalp and extracranial soft tissues grossly unremarkable.  IMPRESSION: Premature for age cerebral  and cerebellar atrophy with white matter disease. No acute intracranial findings. No intracranial hemorrhage or skull fracture.   Electronically Signed   By: Davonna Belling M.D.   On: 05/11/2013 17:53   Dg Chest Port 1 View  05/11/2013   CLINICAL DATA:  Unresponsive patient.  EXAM: PORTABLE CHEST - 1 VIEW  COMPARISON:  None.  FINDINGS: Cardiac silhouette normal and mediastinal contours unremarkable for the AP portable technique. Pulmonary parenchyma clear. Bronchovascular markings normal. Pulmonary vascularity normal. No pneumothorax. No visible pleural effusions. Degenerative changes noted throughout the thoracic spine.  IMPRESSION: No acute cardiopulmonary disease.   Electronically Signed   By: Hulan Saas M.D.   On: 05/11/2013 17:36     EKG: Independently reviewed. Sinus Tachycardia at 129 no acute changes.     Assessment/Plan Principal Problem:   Sepsis Active Problems:   Acute encephalopathy   Dehydration   UTI (lower urinary tract infection)   Hypernatremia   Metabolic Acidosis   Hx of Catatonia   Malnutrition   1.   Sepsis-  Source UTI-  Urine C+S sent, and placed on IV Rocephin, and IVFs.      2.   Acute Encephalopathy- Due to #1, or due to Dehydration, Hypernatremia,  or Metabolic Acidosis compounded by her Catatonia.     Neuro checks q 2 hours,  Rx in #1.  May need MRI/MRA of Brain, or Neuro Consult.  Check TSH level.     3.   Dehydration- IVFs . Monitor Electrolytes and BUN/Cr.    4.   UTI-  Same Rx as #1.    5.   Hypernatremia- due to Dehydration, IVFs for rehydration.  Monitor Na+levels.    6.   Metabolic Acidosis-  Should correct with Treatment of Sepsis, and IVFs.     7.   Hx of Catatonia- See Rx in #2.    8.   DVT prophylaxis with Lovenox.    9.   Malnutrition- due to Gastric Bypass Hx, Most likely has Nutritional Deficiencies.     10.  GI prophylaxis - IV protonix while NPO.    11.  Other- Social Services Consultation requested for evaluation of Home  environment and care.  Code Status:    FULL CODE Family Communication:    Son At bedside Disposition Plan:       Inpatient to Stepdown  Time spent:  60 MInutes  Ron Parker [facility] Pager [phone]  If 7PM-7AM, please contact night-coverage [email or web address] Password [facility] 05/11/2013, 9:37 PM

## 2013-05-11 NOTE — ED Provider Notes (Addendum)
CSN: [id number]     Arrival date & time 05/11/13  1652 History   First MD Initiated Contact with Patient 05/11/13 1703     Chief Complaint  Patient presents with  . Altered Mental Status   (Consider location/radiation/quality/duration/timing/severity/associated sxs/prior Treatment) HPI Comments: Joan [name] is a 52 y.o. female who presents for evaluation, by EMS, for altered mental status. Initially, no one was with her in the emergency department. She was unable to give any history.   Level V caveat: Altered mental status      Patient is a 52 y.o. female presenting with altered mental status. The history is provided by the patient and the EMS personnel.  Altered Mental Status   Past Medical History  Diagnosis Date  . Peripheral neuropathy   . Fibromyalgia   . Hypertension   . Anxiety   . Catatonia    Past Surgical History  Procedure Laterality Date  . Gastric bypass    . Abdominal hysterectomy     Family History  Problem Relation Age of Onset  . Hypertension Mother   . Diabetes Father    History  Substance Use Topics  . Smoking status: Never Smoker   . Smokeless tobacco: Not on file  . Alcohol Use: No   OB History   Grav Para Term Preterm Abortions TAB SAB Ect Mult Living                 Review of Systems  Unable to perform ROS   Allergies  Morphine and related; Paroxetine hcl; Penicillins; and Sertraline hcl  Home Medications   No current outpatient prescriptions on file. BP 101/87  Pulse 89  Temp(Src) 97.6 F (36.4 C) (Axillary)  Resp 22  Ht 5\' 5"  (1.651 m)  Wt 154 lb 8.7 oz (70.1 kg)  BMI 25.72 kg/m2  SpO2 100% Physical Exam  Nursing note and vitals reviewed. Constitutional: She appears well-developed.  Appears older than stated age  HENT:  Head: Normocephalic.  No evident cranial trauma. Oral mucous membranes are extremely dry. There appears to be a large, flat bruise on the right anterior mandible. No mandibular deformity, and no  trismus ( passive testing)  Eyes: Conjunctivae and EOM are normal. Pupils are equal, round, and reactive to light.  Neck: Normal range of motion and phonation normal. Neck supple.  Cardiovascular: Regular rhythm and intact distal pulses.   Tachycardia  Pulmonary/Chest: Effort normal and breath sounds normal. No respiratory distress. She has no wheezes. She has no rales. She exhibits no tenderness.  Airway intact  Abdominal: Soft. She exhibits no distension. There is no tenderness. There is no guarding.  Musculoskeletal: Normal range of motion.  Bruising as shown in photographs ( below) on the left hip and right upper arm. These bruises are flat. There is no associated deformity to the large joints near the bruising.  Neurological: She exhibits normal muscle tone.  She is obtunded. She responds to painful stimuli with purposeful movement.  Skin: Skin is warm and dry.  Psychiatric: She has a normal mood and affect. Her behavior is normal.    ED Course  Procedures (including critical care time) Medications  0.9 %  sodium chloride infusion ( Intravenous New Bag/Given 05/11/13 2109)  sodium chloride 0.9 % bolus 2,000 mL (0 mLs Intravenous Stopped 05/11/13 2106)  cefTRIAXone (ROCEPHIN) 1 g in dextrose 5 % 50 mL IVPB (0 g Intravenous Stopped 05/11/13 2139)  acetaminophen (TYLENOL) suppository 975 mg (975 mg Rectal Given 05/11/13 2128)  Patient Vitals for the past 24 hrs:  BP Temp Temp src Pulse Resp SpO2 Height Weight  05/11/13 2205 101/87 mmHg 97.6 F (36.4 C) Axillary 89 22 100 % 5\' 5"  (1.651 m) 154 lb 8.7 oz (70.1 kg)  05/11/13 2139 - 97 F (36.1 C) Rectal - - - - -  05/11/13 2100 117/82 mmHg - - 87 28 100 % - -  05/11/13 2000 107/80 mmHg - - - 27 - - -  05/11/13 1915 129/93 mmHg - - 110 32 100 % - -  05/11/13 1710 89/68 mmHg 101.4 F (38.6 C) Rectal 124 19 100 % - -    20:20- the patient's son is here with her now. He lives with her and helps her with her medical care. He is able  to give a history that on 03-31-13, she was treated with Bactrim for a UTI. She was unable to tolerate that medication due to vomiting. He contacted the clinic and they referred the patient to her urologist at [facility]. An appointment was scheduled for several weeks later. She has not yet been able to go to that appointment, she has had persistent vomiting gradually worse weakness, and in the last week, has sustained numerous falls. In the last 2 days, she has not moved or spoken. The son cannot recall the last time she was able to eat or drink anything. Today, he called the ambulance to see what was wrong. He has seen her like this before when she had "catatonia". This occurred after she had reversal of a gastric bypass, and was treated at [facility] in [place], [place]. She has been seeing a urologist at [facility], for an unspecified type of urinary tract problem.   8:31 PM-Consult complete with Dr. Lovell Sheehan. Patient case explained and discussed. She agrees to admit patient for further evaluation and treatment. Call ended at 2058   CRITICAL CARE Performed by: Flint Melter Total critical care time: 45 minutes Critical care time was exclusive of separately billable procedures and treating other patients. Critical care was necessary to treat or prevent imminent or life-threatening deterioration. Critical care was time spent personally by me on the following activities: development of treatment plan with patient and/or surrogate as well as nursing, discussions with consultants, evaluation of patient's response to treatment, examination of patient, obtaining history from patient or surrogate, ordering and performing treatments and interventions, ordering and review of laboratory studies, ordering and review of radiographic studies, pulse oximetry and re-evaluation of patient's condition.   Labs Review Labs Reviewed  PROTIME-INR - Abnormal; Notable  for the following:    Prothrombin Time 15.8 (*)    All other components within normal limits  CBC - Abnormal; Notable for the following:    Platelets 53 (*)    All other components within normal limits  DIFFERENTIAL - Abnormal; Notable for the following:    Monocytes Relative 15 (*)    Monocytes Absolute 1.3 (*)    All other components within normal limits  COMPREHENSIVE METABOLIC PANEL - Abnormal; Notable for the following:    Sodium 149 (*)    Glucose, Bld 118 (*)    BUN 55 (*)    Creatinine, Ser 1.80 (*)    Total Protein 8.6 (*)    AST 38 (*)    Total Bilirubin 1.4 (*)    GFR calc non Af Amer 31 (*)    GFR calc Af Amer 36 (*)    All other  components within normal limits  URINE RAPID DRUG SCREEN (HOSP PERFORMED) - Abnormal; Notable for the following:    Benzodiazepines POSITIVE (*)    All other components within normal limits  URINALYSIS, ROUTINE W REFLEX MICROSCOPIC - Abnormal; Notable for the following:    Color, Urine RED (*)    Bilirubin Urine LARGE (*)    Ketones, ur 15 (*)    Nitrite POSITIVE (*)    Leukocytes, UA SMALL (*)    All other components within normal limits  GLUCOSE, CAPILLARY - Abnormal; Notable for the following:    Glucose-Capillary 116 (*)    All other components within normal limits  URINE MICROSCOPIC-ADD ON - Abnormal; Notable for the following:    Casts GRANULAR CAST (*)    All other components within normal limits  LACTIC ACID, PLASMA - Abnormal; Notable for the following:    Lactic Acid, Venous 5.0 (*)    All other components within normal limits  POCT I-STAT, CHEM 8 - Abnormal; Notable for the following:    Sodium 149 (*)    BUN 51 (*)    Creatinine, Ser 2.00 (*)    Glucose, Bld 120 (*)    Calcium, Ion 1.09 (*)    All other components within normal limits  POCT I-STAT TROPONIN I - Abnormal; Notable for the following:    Troponin i, poc 0.09 (*)    All other components within normal limits  URINE CULTURE  MRSA PCR SCREENING  ETHANOL   APTT  TROPONIN I  BLOOD GAS, ARTERIAL   Imaging Review Ct Head Wo Contrast  05/11/2013   CLINICAL DATA:  Altered mental status.  Bruises all over body.  EXAM: CT HEAD WITHOUT CONTRAST  TECHNIQUE: Contiguous axial images were obtained from the base of the skull through the vertex without contrast.  COMPARISON:  None  FINDINGS: Premature for age cerebral and cerebellar atrophy. Hypoattenuation of white matter representing chronic microvascular ischemic change. Calvarium intact. No sinus or mastoid fluid. Scalp and extracranial soft tissues grossly unremarkable.  IMPRESSION: Premature for age cerebral and cerebellar atrophy with white matter disease. No acute intracranial findings. No intracranial hemorrhage or skull fracture.   Electronically Signed   By: Davonna Belling M.D.   On: 05/11/2013 17:53   Dg Chest Port 1 View  05/11/2013   CLINICAL DATA:  Unresponsive patient.  EXAM: PORTABLE CHEST - 1 VIEW  COMPARISON:  None.  FINDINGS: Cardiac silhouette normal and mediastinal contours unremarkable for the AP portable technique. Pulmonary parenchyma clear. Bronchovascular markings normal. Pulmonary vascularity normal. No pneumothorax. No visible pleural effusions. Degenerative changes noted throughout the thoracic spine.  IMPRESSION: No acute cardiopulmonary disease.   Electronically Signed   By: Hulan Saas M.D.   On: 05/11/2013 17:36    EKG Interpretation    Date/Time:  Saturday May 11 2013 16:57:39 EST Ventricular Rate:  129 PR Interval:  121 QRS Duration: 76 QT Interval:  380 QTC Calculation: 557 R Axis:   74 Text Interpretation:  Sinus tachycardia Consider right atrial enlargement Repol abnrm, prob ischemia, inferolateral lds Prolonged QT interval Baseline wander in lead(s) II aVR No previous ECGs available Confirmed by Shan Padgett  MD, Dedra Matsuo ([id number]) on 05/11/2013 10:27:36 PM            MDM   1. Altered mental state   2. UTI (urinary tract infection)   3. Dehydration   4.  Hypernatremia   5. AKI (acute kidney injury)   6. UTI (lower urinary tract infection)  7. Sepsis   8. Acute encephalopathy      Image during exam  Left hip  Right upper arm  Acute kidney injury with dehydration, urinary tract infection, and that weakness leading to multiple falls. Her mental status is more altered than would be expected on the basis of her findings in the emergency department. There may be a component of psychiatric disease, associated with this. Patient needs to be admitted to a step down unit for close monitoring, and further evaluation. I do not think that this represents an acute CVA, sepsis, acute coronary syndrome, or significant metabolic instability.   Nursing Notes Reviewed/ Care Coordinated, and agree without changes. Applicable Imaging Reviewed.  Interpretation of Laboratory Data incorporated into ED treatment    Plan: Admit  Flint Melter, MD 05/11/13 2059  Flint Melter, MD 05/11/13 2228

## 2013-05-11 NOTE — ED Notes (Signed)
Son arrived. States that Nov. 7th pt became sick, Dr. Michelene Gardener in [place] and prescribed Bactrim. Prescription was not filled until Nov. 13.  States pt was unable to keep medication down and informed to follow up at [facility]. appt was made for Dec. 11 however he was unable to get her to the apt. Due to her condition. Pt baseline is that she is able to walk, cook, talk and get around normally. Pt began having increased falls and Over past 2 days she has not been able to get up or communicate.

## 2013-05-12 LAB — CBC
MCH: 29.8 pg (ref 26.0–34.0)
MCHC: 33.3 g/dL (ref 30.0–36.0)
Platelets: 42 10*3/uL — ABNORMAL LOW (ref 150–400)
RDW: 13.7 % (ref 11.5–15.5)

## 2013-05-12 LAB — TSH: TSH: 0.84 u[IU]/mL (ref 0.350–4.500)

## 2013-05-12 LAB — BASIC METABOLIC PANEL
BUN: 45 mg/dL — ABNORMAL HIGH (ref 6–23)
Calcium: 8.1 mg/dL — ABNORMAL LOW (ref 8.4–10.5)
Creatinine, Ser: 1.28 mg/dL — ABNORMAL HIGH (ref 0.50–1.10)
GFR calc non Af Amer: 48 mL/min — ABNORMAL LOW (ref >90)
Glucose, Bld: 114 mg/dL — ABNORMAL HIGH (ref 70–99)

## 2013-05-12 NOTE — Progress Notes (Signed)
[facility] Progress Note [place] TEAM 1 - Stepdown/ICU TEAM   Candida Peeling YNW:[id number] DOB: 1960-08-30 DOA: 05/11/2013 PCP: No primary provider on file.  Admit HPI / Brief Narrative: 52 y.o. female who was brought to the ED after having decreased responsiveness for 2-3 days. She was unresponsive at presentation and her son gave the history. 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 He stated that she has not had any food or liquids for the last 203 days. She was diagnosed with a UTI on 04/05/2013 and was prescribed Bactrim therapy but she did not take the medication due to increased nausea and vomiting. Her son also reported that prior to this she had been falling down a lot and had sustained multiple bruises from her falls.   In the ED, she was evaluated and found to be minimally responsive and febrile. She was found to have a UTI, and and increased lactic acid level of 5.  She was placed on IV Rocephin and IVFs for rehydration. A CT scan of the head was performed and was found to be negative for acute findings.   HPI/Subjective: Patient will open her eyes and looking the examiner but does not answer questions.  She will intermittently follow simple commands.  Assessment/Plan:  Toxic metabolic encephalopathy Likely combination of encephalopathy related to pyelonephritis and possible psychiatric issues/catatonia - no acute findings on CT scan of the head  Acute renal failure Likely simply prerenal azotemia related to dehydration due to poor intake - improving with hydration - continue to follow  Metabolic acidosis Due to acute renal failure - follow trend  UTI / pyelonephritis  Continue empiric antibiotic therapy - followup culture data when available  Hx of catatonia Son reports 2 prior episodes but states they are quite infrequent - does not appear to be followed chronically by Psychiatry or Neurology  Code Status:  FULL Family Communication: Spoke at length with son at bedside Disposition Plan: SDU  Consultants: None  Procedures: none  Antibiotics: Rocphin 12/13 >>  DVT prophylaxis: lovenox  Objective: Blood pressure 108/54, pulse 100, temperature 98.9 F (37.2 C), temperature source Axillary, resp. rate 22, height 5\' 5"  (1.651 m), weight 70.1 kg (154 lb 8.7 oz), SpO2 100.00%.  Intake/Output Summary (Last 24 hours) at 05/12/13 1243 Last data filed at 05/12/13 0600  Gross per 24 hour  Intake 758.33 ml  Output    300 ml  Net 458.33 ml   Exam: General: No acute respiratory distress Lungs: Clear to auscultation bilaterally without wheezes or crackles Cardiovascular: Regular rate and rhythm without murmur gallop or rub normal S1 and S2 Abdomen: Nontender, nondistended, soft, bowel sounds positive, no rebound, no ascites, no appreciable mass Extremities: No significant cyanosis, clubbing, or edema bilateral lower extremities  Data Reviewed: Basic Metabolic Panel:  Recent Labs Lab 05/11/13 1755 05/11/13 1802 05/12/13 0640  NA 149* 149* 148*  K 3.6 3.5 3.4*  CL 107 111 115*  CO2 20  --  17*  GLUCOSE 118* 120* 114*  BUN 55* 51* 45*  CREATININE 1.80* 2.00* 1.28*  CALCIUM 9.7  --  8.1*   Liver Function Tests:  Recent Labs Lab 05/11/13 1755  AST 38*  ALT 29  ALKPHOS 53  BILITOT 1.4*  PROT 8.6*  ALBUMIN 4.0   No results found for this basename: LIPASE, AMYLASE,  in the last 168 hours No results found for this basename: AMMONIA,  in the last 168 hours  CBC:  Recent Labs Lab 05/11/13 1755 05/11/13 1802 05/12/13 0640  WBC 8.8  --  8.4  NEUTROABS 6.1  --   --   HGB 14.3 14.3 11.1*  HCT 41.9 42.0 33.3*  MCV 89.9  --  89.5  PLT 53*  --  42*   Cardiac Enzymes:  Recent Labs Lab 05/11/13 1755  TROPONINI <0.30   CBG:  Recent Labs Lab 05/11/13 1750  GLUCAP 116*    Recent Results (from the past 240 hour(s))  MRSA PCR SCREENING     Status: None   Collection  Time    05/11/13 10:30 PM      Result Value Range Status   MRSA by PCR NEGATIVE  NEGATIVE Final   Comment:            The GeneXpert MRSA Assay (FDA     approved for NASAL specimens     only), is one component of a     comprehensive MRSA colonization     surveillance program. It is not     intended to diagnose MRSA     infection nor to guide or     monitor treatment for     MRSA infections.     Studies:  Recent x-ray studies have been reviewed in detail by the Attending Physician  Scheduled Meds:  Scheduled Meds: . sodium chloride   Intravenous STAT  . cefTRIAXone (ROCEPHIN)  IV  1 g Intravenous Q24H  . enoxaparin (LOVENOX) injection  30 mg Subcutaneous Q24H  . pantoprazole (PROTONIX) IV  40 mg Intravenous Q12H    Time spent on care of this patient: 35 mins   [facility] T  [facility] Office  [phone] Pager - Text Page per Loretha Stapler as per below:  On-Call/Text Page:      [email or web address]      password [email or web address]  If 7PM-7AM, please contact night-coverage [email or web address] Password [email or web address] 05/12/2013, 12:43 PM   LOS: 1 day

## 2013-05-12 NOTE — Progress Notes (Signed)
05/12/2013 0600, notified Lenny Pastel, patient had not voided since  I admitted her at 2200, bladder scan 274, orders received, will continue to monitor. Dorie Rank

## 2013-05-13 ENCOUNTER — Inpatient Hospital Stay (HOSPITAL_COMMUNITY): Payer: Medicare Other

## 2013-05-13 LAB — COMPREHENSIVE METABOLIC PANEL
AST: 24 U/L (ref 0–37)
CO2: 15 mEq/L — ABNORMAL LOW (ref 19–32)
Calcium: 8.1 mg/dL — ABNORMAL LOW (ref 8.4–10.5)
Creatinine, Ser: 1.08 mg/dL (ref 0.50–1.10)
GFR calc Af Amer: 68 mL/min — ABNORMAL LOW (ref >90)
GFR calc non Af Amer: 58 mL/min — ABNORMAL LOW (ref >90)
Total Protein: 6.1 g/dL (ref 6.0–8.3)

## 2013-05-13 LAB — CBC
HCT: 30.6 % — ABNORMAL LOW (ref 36.0–46.0)
Hemoglobin: 10.2 g/dL — ABNORMAL LOW (ref 12.0–15.0)
MCH: 30.1 pg (ref 26.0–34.0)
MCHC: 33.3 g/dL (ref 30.0–36.0)
MCV: 90.3 fL (ref 78.0–100.0)
RBC: 3.39 MIL/uL — ABNORMAL LOW (ref 3.87–5.11)

## 2013-05-13 LAB — AMMONIA: Ammonia: 22 umol/L (ref 11–60)

## 2013-05-13 LAB — URINE CULTURE
Colony Count: NO GROWTH
Culture: NO GROWTH

## 2013-05-13 LAB — RPR: RPR Ser Ql: NONREACTIVE

## 2013-05-13 LAB — LACTIC ACID, PLASMA: Lactic Acid, Venous: 2.6 mmol/L — ABNORMAL HIGH (ref 0.5–2.2)

## 2013-05-13 LAB — FOLATE: Folate: 2.8 ng/mL — ABNORMAL LOW

## 2013-05-13 LAB — MAGNESIUM: Magnesium: 2.1 mg/dL (ref 1.5–2.5)

## 2013-05-13 MED ORDER — POTASSIUM CHLORIDE 10 MEQ/100ML IV SOLN
10.0000 meq | INTRAVENOUS | Status: AC
  Administered 2013-05-13 (×3): 10 meq via INTRAVENOUS
  Filled 2013-05-13 (×2): qty 100

## 2013-05-13 MED ORDER — LORAZEPAM 2 MG/ML IJ SOLN
1.0000 mg | Freq: Once | INTRAMUSCULAR | Status: AC
  Administered 2013-05-13: 1 mg via INTRAVENOUS

## 2013-05-13 MED ORDER — LORAZEPAM 2 MG/ML IJ SOLN
INTRAMUSCULAR | Status: AC
  Administered 2013-05-13: 2 mg via INTRAVENOUS
  Filled 2013-05-13: qty 1

## 2013-05-13 MED ORDER — INFLUENZA VAC SPLIT QUAD 0.5 ML IM SUSP
0.5000 mL | INTRAMUSCULAR | Status: DC
  Filled 2013-05-13: qty 0.5

## 2013-05-13 MED ORDER — ENOXAPARIN SODIUM 30 MG/0.3ML ~~LOC~~ SOLN: 30.0000 mg | SUBCUTANEOUS | Status: DC

## 2013-05-13 MED ORDER — THIAMINE HCL 100 MG/ML IJ SOLN
100.0000 mg | Freq: Every day | INTRAMUSCULAR | Status: DC
  Administered 2013-05-13 – 2013-05-19 (×6): 100 mg via INTRAVENOUS
  Filled 2013-05-13 (×7): qty 1

## 2013-05-13 MED ORDER — THIAMINE HCL 100 MG/ML IJ SOLN
500.0000 mg | Freq: Once | INTRAMUSCULAR | Status: AC
  Administered 2013-05-13: 500 mg via INTRAVENOUS
  Filled 2013-05-13: qty 5

## 2013-05-13 MED ORDER — LORAZEPAM 2 MG/ML IJ SOLN
2.0000 mg | Freq: Once | INTRAMUSCULAR | Status: AC
  Administered 2013-05-13: 2 mg via INTRAVENOUS

## 2013-05-13 MED ORDER — LORAZEPAM 2 MG/ML IJ SOLN
INTRAMUSCULAR | Status: AC
  Administered 2013-05-13: 1 mg via INTRAVENOUS
  Filled 2013-05-13: qty 1

## 2013-05-13 MED ORDER — GADOBENATE DIMEGLUMINE 529 MG/ML IV SOLN
15.0000 mL | Freq: Once | INTRAVENOUS | Status: AC
  Administered 2013-05-13: 15 mL via INTRAVENOUS

## 2013-05-13 MED ORDER — SODIUM CHLORIDE 0.45 % IV SOLN
INTRAVENOUS | Status: DC
  Administered 2013-05-13 – 2013-05-17 (×5): via INTRAVENOUS
  Administered 2013-05-18 (×2): 1000 mL via INTRAVENOUS
  Administered 2013-05-18: 10:00:00 via INTRAVENOUS

## 2013-05-13 NOTE — Progress Notes (Signed)
[facility] Progress Note [place] TEAM 1 - Stepdown/ICU TEAM   Candida Peeling BJY:[id number] DOB: 1960-09-24 DOA: 05/11/2013 PCP: No primary provider on file.  Admit HPI / Brief Narrative: 52 y.o. female who was brought to the ED after having decreased responsiveness for 2-3 days. She was unresponsive at presentation and her son gave the history. 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 He stated that she has not had any food or liquids for the last 203 days. She was diagnosed with a UTI on 04/05/2013 and was prescribed Bactrim therapy but she did not take the medication due to increased nausea and vomiting. Her son also reported that prior to this she had been falling down a lot and had sustained multiple bruises from her falls.   In the ED, she was evaluated and found to be minimally responsive and febrile. She was found to have a UTI, and and increased lactic acid level of 5.  She was placed on IV Rocephin and IVFs for rehydration. A CT scan of the head was performed and was found to be negative for acute findings.   HPI/Subjective: Patient is less responsive today.  She would not open her eyes.  She is not communicating via any effective means.  There is no family present at the time of my exam.  Assessment/Plan:  Toxic metabolic encephalopathy -Likely combination of encephalopathy related to pyelonephritis and possible psychiatric issues/catatonia  - no acute findings on CT scan of the head - Neuro rec MRI brain (asymmetric pupils) -EEG pending -checking cortisol and thiamine levels  Acute renal failure -Likely simply prerenal azotemia related to dehydration due to poor intake  - improving with hydration  - continue to follow  Metabolic acidosis -Due to acute renal failure  - seems more precipitated by hyperchloremia - IVFs adjusted to 1/2 NS earlier this am  ??UTI / pyelonephritis  -Continue empiric antibiotic therapy   - cx not successful in identifying offending organism  Hx of catatonia -Son reported two prior episodes but states they have been quite infrequent  - does not appear to be followed chronically by Psychiatry or Neurology  Code Status: FULL Family Communication: No family at bedside Disposition Plan: SDU  Consultants: Neurology  Procedures: none  Antibiotics: Rocephin 12/13 >>  DVT prophylaxis: lovenox  Objective: Blood pressure 96/66, pulse 103, temperature 98.3 F (36.8 C), temperature source Oral, resp. rate 28, height 5\' 5"  (1.651 m), weight 154 lb 8.7 oz (70.1 kg), SpO2 100.00%.  Intake/Output Summary (Last 24 hours) at 05/13/13 1523 Last data filed at 05/13/13 1200  Gross per 24 hour  Intake 2376.25 ml  Output    600 ml  Net 1776.25 ml   Exam: General: No acute respiratory distress Lungs: Clear to auscultation bilaterally without wheezes or crackles Cardiovascular: Regular rate and rhythm without murmur gallop or rub normal S1 and S2 Abdomen: Nontender, nondistended, soft, bowel sounds positive, no rebound, no ascites, no appreciable mass Extremities: No significant cyanosis, clubbing, or edema bilateral lower extremities Neurological: Remains non arousable  Data Reviewed: Basic Metabolic Panel:  Recent Labs Lab 05/11/13 1755 05/11/13 1802 05/12/13 0640 05/13/13 0345  NA 149* 149* 148* 152*  K 3.6 3.5 3.4* 3.1*  CL 107 111 115* 119*  CO2 20  --  17* 15*  GLUCOSE 118* 120* 114* 94  BUN 55* 51* 45* 31*  CREATININE 1.80* 2.00* 1.28* 1.08  CALCIUM 9.7  --  8.1* 8.1*  MG  --   --   --  2.1   Liver Function Tests:  Recent Labs Lab 05/11/13 1755 05/13/13 0345  AST 38* 24  ALT 29 17  ALKPHOS 53 36*  BILITOT 1.4* 0.6  PROT 8.6* 6.1  ALBUMIN 4.0 2.7*    Recent Labs Lab 05/13/13 0345  AMMONIA 22    CBC:  Recent Labs Lab 05/11/13 1755 05/11/13 1802 05/12/13 0640 05/13/13 0345  WBC 8.8  --  8.4 5.8  NEUTROABS 6.1  --   --   --   HGB  14.3 14.3 11.1* 10.2*  HCT 41.9 42.0 33.3* 30.6*  MCV 89.9  --  89.5 90.3  PLT 53*  --  42* 38*   Cardiac Enzymes:  Recent Labs Lab 05/11/13 1755  TROPONINI <0.30   CBG:  Recent Labs Lab 05/11/13 1750  GLUCAP 116*    Recent Results (from the past 240 hour(s))  URINE CULTURE     Status: None   Collection Time    05/11/13  7:02 PM      Result Value Range Status   Specimen Description URINE, CATHETERIZED   Final   Special Requests ADDED 0503 05/12/13   Final   Culture  Setup Time     Final   Value: 05/11/2013 05:40     Performed at [facility]   Colony Count     Final   Value: NO GROWTH     Performed at [facility]   Culture     Final   Value: NO GROWTH     Performed at [facility]   Report Status 05/13/2013 FINAL   Final  MRSA PCR SCREENING     Status: None   Collection Time    05/11/13 10:30 PM      Result Value Range Status   MRSA by PCR NEGATIVE  NEGATIVE Final   Comment:            The GeneXpert MRSA Assay (FDA     approved for NASAL specimens     only), is one component of a     comprehensive MRSA colonization     surveillance program. It is not     intended to diagnose MRSA     infection nor to guide or     monitor treatment for     MRSA infections.     Studies:  Recent x-ray studies have been reviewed in detail by the Attending Physician  Scheduled Meds:  Scheduled Meds: . cefTRIAXone (ROCEPHIN)  IV  1 g Intravenous Q24H  . [START ON 05/14/2013] enoxaparin (LOVENOX) injection  30 mg Subcutaneous Q24H  . [START ON 05/14/2013] influenza vac split quadrivalent PF  0.5 mL Intramuscular Tomorrow-1000  . pantoprazole (PROTONIX) IV  40 mg Intravenous Q12H  . thiamine  100 mg Intravenous Daily    Time spent on care of this patient: 35 mins   ELLIS,ALLISON L. ANP  [facility] Office  [phone] Pager - Text Page per Loretha Stapler as per below:  On-Call/Text Page:      [email or web address]      password [email or web address]  If 7PM-7AM,  please contact night-coverage [email or web address] Password [email or web address] 05/13/2013, 3:23 PM   LOS: 2 days   I have personally examined this patient and reviewed the entire database. I have reviewed the above note, made any necessary editorial changes, and agree with its content.  Lonia Blood, MD [facility]

## 2013-05-13 NOTE — Progress Notes (Signed)
[facility] progress note. Chief complaint. Rule out seizure activity. History of present illness. This 52 year old female was brought to the emergency room and admitted to [facility] with a history of 2-3 days of decreased responsiveness. 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 She was felt to have toxic encephalopathy related to pyelonephritis and possibly psychiatric issues/catatonia. CT scan of the head was done with no acute findings. Nursing became concerned overnight with the patient having intermittent full body spasms and rapid eye movements. I came to see the patient at bedside and found her with eyes open but no nonresponsive to anything other than painful stimuli. She is completely nonverbal. Vital signs. Temperature 99.4, pulse 105, respiration 31, blood pressure 1:15/83. O2 sats 100%. General appearance. This is a well-developed late middle-aged female who has eyes open but nonverbal and nonresponsive to anything other than painful stimuli. She demonstrates intermittent spasm of the whole body as well as rapid eye movements and intermittent tachypnea. Cardiac. Rate and rhythm regular. Lungs. Breath sounds clear and equal. Abdomen. Soft with positive bowel sounds. Neurologic. Cranial nerves II through XII grossly intact assessment difficult due to to the degree of cooperation. She has movement with painful stimuli of all 4 extremities. No obvious unilateral or focal defects. She demonstrates intermittent spasm of her body and rapid eye movements. Impression/plan. Problem #1. Catatonia versus atypical seizure. This in a patient that carries a prior history of catatonia per family report. I contacted neurology Doctor Amada Jupiter who was kind enough to evaluate the patient. It was unclear at this time whether this represented further catatonic presentation versus atypical seizure. Neurology ordered a total of 3 mg  Ativan IV and noted a marked decrease in spasm/rapid eye movement/tachypnea. I have ordered and a.m. EEG per neurology's request. Nursing will update me of any further changes. Patient currently appears clinically stable.

## 2013-05-13 NOTE — Progress Notes (Signed)
Paged and spoke with T. Claiborne Billings of [facility] regarding pt's mild intermittent full body tremors. Pt appears in no acute distress. Per pt's son, who left 30 minutes ago, she has been having intermittent tremors since admission to hospital. This RN questioned seizure activity. No orders received. Benedetto Coons endorsed he would pass along to Day MD. This RN will convey to oncoming RN at shift change. Will continue to monitor and assess.

## 2013-05-13 NOTE — Progress Notes (Signed)
Utilization review completed.  

## 2013-05-13 NOTE — Progress Notes (Signed)
@  approx 0330 called T Callahan of [facility] back about pt's increasing tremors accompanied by increased HR (120s), tachypnea (RR 20s-40s) and decreased responsiveness. MD to bedside and consulted Neuro. Orders received from [place] of [employer] as Benedetto Coons. Will continue to monitor.

## 2013-05-13 NOTE — Progress Notes (Signed)
Son from [place] at bedside. This RN answered his questions regarding his mother's condition/care and provided emotional support/empathy. Son unfamiliar with pt's medical hx and deferred admission questions to his brother (the primary caretaker of his mother) who will be here in the AM. Will convey this to oncoming RN at shift change.

## 2013-05-13 NOTE — Consult Note (Addendum)
Neurology Consultation Reason for Consult: Altered mental status Referring Physician: Claiborne Billings, T(McClung, J)  CC: Altered mental status  History is obtained from: Referring physician, chart  HPI: Joan [name] is a 52 y.o. female with a history of catatonic states who presented with altered mental status in the setting of urinary tract infection. She has been essentially unresponsive since admission. She has apparently been having intermittent tremors since admission to the hospital on 12/13. Tonight, she was noticed to be having tremors, tachypnea and there is concern for ongoing seizure activity and therefore neurology was consulted.  Her pupils are asymmetric, and this is not documented in her admission notes, though the nurse states that he received in report that it has been present in at least the past two shifts prior to his.  On arrival, she had low amplitude asynchronous jerks bilaterally which seem to cease following Ativan 3 mg  ROS: Unable to assess secondary to patient's altered mental status.    Past Medical History  Diagnosis Date  . Peripheral neuropathy   . Fibromyalgia   . Hypertension   . Anxiety   . Catatonia     Family History: Unable to assess secondary to patient's altered mental status.    Social History: Tob: Unable to assess secondary to patient's altered mental status.    Exam: Current vital signs: BP 115/83  Pulse 105  Temp(Src) 99.4 F (37.4 C) (Rectal)  Resp 31  Ht 5\' 5"  (1.651 m)  Wt 70.1 kg (154 lb 8.7 oz)  BMI 25.72 kg/m2  SpO2 100% Vital signs in last 24 hours: Temp:  [98.1 F (36.7 C)-99.4 F (37.4 C)] 99.4 F (37.4 C) (12/15 0335) Pulse Rate:  [97-109] 105 (12/15 0345) Resp:  [18-31] 31 (12/15 0345) BP: (108-119)/(54-90) 115/83 mmHg (12/15 0345) SpO2:  [100 %] 100 % (12/15 0345)  General: In bed, eyes open, head turned towards the right CV: Tachycardic Neck: Supple Mental Status: Patient is awake, she does not fixate or  track, does not follow commands. Cranial Nerves: II: She does blink to threat clearly from the left, not as clear from the right, but I do suspect that she did. Pupils are unequal, left pupil 6 mm, right pupil 3 mm.  Discs are difficult to visualize. [name],IV, VI:  She crosses midline both directions orienting to noxious stimuli.  V/VII: Corneals intact  VIII, X, XI, XII: Unable to assess secondary to patient's altered mental status.  Motor: Tone is normal. Bulk is normal. She withdraws all extremities to noxious stimuli. Sensory: As above Deep Tendon Reflexes: 1+ and symmetric in the biceps and patellae.  Plantars: Toes are downgoing bilaterally.  Cerebellar: Unable to assess secondary to patient's altered mental status.  Gait: Unable to assess secondary to patient's altered mental status.   I have reviewed labs in epic and the results pertinent to this consultation are: TSH-normal Ammonia-normal UA-UTI  I have reviewed the images obtained: CT head from admission-unremarkable  Impression: 52 year old female with acute encephalopathy in the setting of urinary tract infection. She does have a history of benzodiazepine use and some cerebral atrophy, and therefore may have some predisposition to delirium in the setting of infection. Catatonia is a possibility, however I would favor ruling out seizure activity with EEG. Whatever process is going on, it has been going on for at least 4-5 days.  Recommendations: 1) EEG 2) stat head CT given asymmetric pupils, if no findings to explain mental state would pursue MRI 3) thiamine replacement 4) cortisol 5) further  recommendations pending above studies  [name], MD [facility] [phone]  If 7pm- 7am, please page neurology on call at [phone].

## 2013-05-13 NOTE — Procedures (Signed)
ELECTROENCEPHALOGRAM REPORT   Patient: Joan [name]       Room #: 1O10 EEG No. ID: [id number] Age: 52 y.o.        Sex: female Referring Physician: Sharon Seller Report Date:  05/13/2013        Interpreting Physician: Aline Brochure  History: Kairy Folsom is an 52 y.o. female history of catatonia presenting with altered mental status in the setting of urinary tract infection.  Indications for study:  Assess severity of encephalopathy; rule out focal seizure activity.  Technique: This is an 18 channel routine scalp EEG performed at the bedside with bipolar and monopolar montages arranged in accordance to the international 10/20 system of electrode placement.   Description: Patient was noted to be unresponsive at the time of this EEG recording. Background activity consisted of low amplitude 1-2 Hz diffuse irregular continuous delta activity with superimposed 6-7 Hz the activity which was most prominent and somewhat rhythmic posteriorly. Photic stimulation was not performed. No epileptiform discharges were recorded.  Interpretation: This EEG is abnormal with moderately severe continuous generalized nonspecific slowing of cerebral activity. This pattern of slowing can be seen with a wide variety of encephalopathic processes, including metabolic, toxic and degenerative encephalopathies. No evidence of an epileptic disorder was demonstrated.  Venetia Maxon M.D. [facility] [phone]

## 2013-05-13 NOTE — Progress Notes (Signed)
EEG Completed; Results Pending  

## 2013-05-14 ENCOUNTER — Inpatient Hospital Stay (HOSPITAL_COMMUNITY): Payer: Medicare Other

## 2013-05-14 DIAGNOSIS — N179 Acute kidney failure, unspecified: Secondary | ICD-10-CM

## 2013-05-14 LAB — CSF CELL COUNT WITH DIFFERENTIAL
Lymphs, CSF: 100 % — ABNORMAL HIGH (ref 40–80)
Monocyte-Macrophage-Spinal Fluid: 0 % — ABNORMAL LOW (ref 15–45)
RBC Count, CSF: 315 /mm3 — ABNORMAL HIGH
Segmented Neutrophils-CSF: 0 % (ref 0–6)
Tube #: 3
WBC, CSF: 26 /mm3 (ref 0–5)

## 2013-05-14 LAB — CBC
HCT: 25.5 % — ABNORMAL LOW (ref 36.0–46.0)
Hemoglobin: 8.5 g/dL — ABNORMAL LOW (ref 12.0–15.0)
MCH: 30.2 pg (ref 26.0–34.0)
MCH: 30.7 pg (ref 26.0–34.0)
MCHC: 33.3 g/dL (ref 30.0–36.0)
MCHC: 33.5 g/dL (ref 30.0–36.0)
MCV: 90.7 fL (ref 78.0–100.0)
Platelets: 43 10*3/uL — ABNORMAL LOW (ref 150–400)
RDW: 14.3 % (ref 11.5–15.5)
RDW: 14.4 % (ref 11.5–15.5)
WBC: 5.5 10*3/uL (ref 4.0–10.5)

## 2013-05-14 LAB — PROTIME-INR
INR: 1.41 (ref 0.00–1.49)
Prothrombin Time: 16.9 seconds — ABNORMAL HIGH (ref 11.6–15.2)

## 2013-05-14 LAB — COMPREHENSIVE METABOLIC PANEL
Albumin: 2.6 g/dL — ABNORMAL LOW (ref 3.5–5.2)
Alkaline Phosphatase: 36 U/L — ABNORMAL LOW (ref 39–117)
BUN: 17 mg/dL (ref 6–23)
Chloride: 116 mEq/L — ABNORMAL HIGH (ref 96–112)
Creatinine, Ser: 0.89 mg/dL (ref 0.50–1.10)
GFR calc Af Amer: 86 mL/min — ABNORMAL LOW (ref >90)
GFR calc non Af Amer: 74 mL/min — ABNORMAL LOW (ref >90)
Glucose, Bld: 64 mg/dL — ABNORMAL LOW (ref 70–99)
Potassium: 3.5 mEq/L (ref 3.5–5.1)
Total Bilirubin: 0.4 mg/dL (ref 0.3–1.2)
Total Protein: 5.7 g/dL — ABNORMAL LOW (ref 6.0–8.3)

## 2013-05-14 LAB — PROTEIN AND GLUCOSE, CSF: Glucose, CSF: 42 mg/dL — ABNORMAL LOW (ref 43–76)

## 2013-05-14 MED ORDER — BIOTENE DRY MOUTH MT LIQD
15.0000 mL | Freq: Two times a day (BID) | OROMUCOSAL | Status: DC
  Administered 2013-05-15 – 2013-05-27 (×25): 15 mL via OROMUCOSAL

## 2013-05-14 MED ORDER — CHLORHEXIDINE GLUCONATE 0.12 % MT SOLN
15.0000 mL | Freq: Two times a day (BID) | OROMUCOSAL | Status: DC
  Administered 2013-05-15 – 2013-05-27 (×21): 15 mL via OROMUCOSAL
  Filled 2013-05-14 (×28): qty 15

## 2013-05-14 MED ORDER — FOLIC ACID 5 MG/ML IJ SOLN
1.0000 mg | Freq: Every day | INTRAMUSCULAR | Status: DC
  Administered 2013-05-15 – 2013-05-18 (×4): 1 mg via INTRAVENOUS
  Filled 2013-05-14 (×10): qty 0.2

## 2013-05-14 NOTE — Progress Notes (Signed)
INITIAL NUTRITION ASSESSMENT  DOCUMENTATION CODES Per approved criteria  -Not Applicable   INTERVENTION: None at this time.   NUTRITION DIAGNOSIS: Inadequate oral intake related to inability to eat as evidenced by NPO status.  Goal: Pt to meet >/= 90% of their estimated nutrition needs   Monitor:  Diet advancement, plan of care  Reason for Assessment: Pt identified as at nutrition risk on the Malnutrition Screen Tool/Low Braden  52 y.o. female  Admitting Dx: Sepsis  ASSESSMENT: Pt admitted for decreased responsiveness for 2-3 days. Pt was positive for UTI and is on antibiotic therapy.  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 LP pending. Per MD mental status has improved from yesterday. Pt is still unable to answer any questions but was pleasant during her exam.   Nutrition Focused Physical Exam:  Subcutaneous Fat:  Orbital Region: WNL Upper Arm Region: WNL Thoracic and Lumbar Region: WNL  Muscle:  Temple Region: WNL Clavicle Bone Region: WNL Clavicle and Acromion Bone Region: WNL Scapular Bone Region: WNL Dorsal Hand: WNL Patellar Region: WNL Anterior Thigh Region: WNL Posterior Calf Region: WNL  Edema: not present  Height: Ht Readings from Last 1 Encounters:  05/11/13 5\' 5"  (1.651 m)    Weight: Wt Readings from Last 1 Encounters:  05/11/13 154 lb 8.7 oz (70.1 kg)    Ideal Body Weight: 56.8 kg   % Ideal Body Weight: 123%  Wt Readings from Last 10 Encounters:  05/11/13 154 lb 8.7 oz (70.1 kg)    Usual Body Weight: unknown  % Usual Body Weight: -  BMI:  Body mass index is 25.72 kg/(m^2).  Estimated Nutritional Needs: Kcal: 1700-1900 Protein: 80-90 grams Fluid: > 1.7 L/day  Skin: not documented  Diet Order: NPO  EDUCATION NEEDS: -No education needs identified at this time   Intake/Output Summary (Last 24 hours) at 05/14/13 0851 Last data filed at  05/14/13 0700  Gross per 24 hour  Intake 2302.92 ml  Output   1000 ml  Net 1302.92 ml    Last BM: PTA   Labs:   Recent Labs Lab 05/12/13 0640 05/13/13 0345 05/14/13 0450  NA 148* 152* 147*  K 3.4* 3.1* 3.5  CL 115* 119* 116*  CO2 17* 15* 18*  BUN 45* 31* PENDING  CREATININE 1.28* 1.08 PENDING  CALCIUM 8.1* 8.1* 8.4  MG  --  2.1  --   GLUCOSE 114* 94 64*    CBG (last 3)   Recent Labs  05/11/13 1750  GLUCAP 116*   No results found for this basename: HGBA1C   Scheduled Meds: . cefTRIAXone (ROCEPHIN)  IV  1 g Intravenous Q24H  . influenza vac split quadrivalent PF  0.5 mL Intramuscular Tomorrow-1000  . pantoprazole (PROTONIX) IV  40 mg Intravenous Q12H  . thiamine  100 mg Intravenous Daily    Continuous Infusions: . sodium chloride 75 mL/hr at 05/13/13 2013    Past Medical History  Diagnosis Date  . Peripheral neuropathy   . Fibromyalgia   . Hypertension   . Anxiety   . Catatonia     Past Surgical History  Procedure Laterality Date  . Gastric bypass    . Abdominal hysterectomy      Kendell Bane RD, LDN, CNSC [phone] Pager [phone] After Hours Pager

## 2013-05-14 NOTE — Clinical Social Work Placement (Signed)
Clinical Social Work Department CLINICAL SOCIAL WORK PLACEMENT NOTE 05/14/2013  Patient:  Joan [name], Joan [name]  Account Number:  [id number] Admit date:  05/11/2013  Clinical Social Worker:  Cherre Blanc, [place]  Date/time:  05/14/2013 03:26 PM  Clinical Social Work is seeking post-discharge placement for this patient at the following level of care:   SKILLED NURSING   (*CSW will update this form in Epic as items are completed)   05/14/2013  Patient/family provided with [facility] Department of Clinical Social Work's list of facilities offering this level of care within the geographic area requested by the patient (or if unable, by the patient's family).  05/14/2013  Patient/family informed of their freedom to choose among providers that offer the needed level of care, that participate in Medicare, Medicaid or managed care program needed by the patient, have an available bed and are willing to accept the patient.  05/14/2013  Patient/family informed of [facility]' ownership interest in [facility], as well as of the fact that they are under no obligation to receive care at this facility.  PASARR submitted to EDS on  PASARR number received from EDS on   FL2 transmitted to all facilities in geographic area requested by pt/family on  05/14/2013 FL2 transmitted to all facilities within larger geographic area on   Patient informed that his/her managed care company has contracts with or will negotiate with  certain facilities, including the following:     Patient/family informed of bed offers received:   Patient chooses bed at  Physician recommends and patient chooses bed at    Patient to be transferred to  on   Patient to be transferred to facility by   The following physician request were entered in Epic:   Additional Comments:   Roddie Mc, [place], [place], [phone]

## 2013-05-14 NOTE — Progress Notes (Signed)
Attempted LP x 1 . Minimal bloody return. Due to difficult anatomy, would suggest reattempt under fluoroscopy.  ALVA,RAKESH V.

## 2013-05-14 NOTE — Clinical Social Work Psychosocial (Signed)
Clinical Social Work Department BRIEF PSYCHOSOCIAL ASSESSMENT 05/14/2013  Patient:  Joan [name], Joan [name]     Account Number:  [id number]     Admit date:  05/11/2013  Clinical Social Worker:  Lavell Luster  Date/Time:  05/14/2013 01:18 PM  Referred by:  Physician  Date Referred:  05/14/2013 Referred for  SNF Placement  Abuse and/or neglect   Other Referral:   Interview type:  Other - See comment Other interview type:   Patient not oriented x4 at this time. Patient's son Sandria Bales interviewed for SNF placement. Abuse/Neglect were not addressed as CSW feels it will be more appropriate to address this with the patient one on one.    PSYCHOSOCIAL DATA Living Status:  FAMILY Admitted from facility:   Level of care:   Primary support name:  Doylene Bode [phone] Primary support relationship to patient:  CHILD, ADULT Degree of support available:   Support is limited.    CURRENT CONCERNS Current Concerns  Post-Acute Placement   Other Concerns:    SOCIAL WORK ASSESSMENT / PLAN CSW spoke with patient's son over phone. Berna Spare states that patient is from home with him. Patient also has another son [name]. Berna Spare is agreeable to looking at what SNF options are available for the patient. Son states that patient has stayed at three facilities in the past but he cannot recall the names of the facilities. Berna Spare states that he would like his mother to be considered for CIR and have SNF as a backup. CSW explained that patient's insurance would make it necessary for him or the patient to choose the SNF they will go with very quickly as the facility needs to submit for authorization with BCBS. Son understands.   Assessment/plan status:  Psychosocial Support/Ongoing Assessment of Needs Other assessment/ plan:   Complete FL2, Fax, PASRR   Information/referral to community resources:   CSW contact information and SNF list left in patient's room.    PATIENT'S/FAMILY'S  RESPONSE TO PLAN OF CARE: Patient not alert and oriented at time of assessment. Son Berna Spare is agreeable to SNF search and placement if necessary. Son was pleasant, appropriate, and appreciative of CSW contact. Son awaits bed offers. CSW will continue to follow.     Roddie Mc, [place], [place], [phone]

## 2013-05-14 NOTE — Progress Notes (Signed)
Subjective: Mental status has improved compared with yesterday, with the patient alert and cooperative.  This morning, the patient continues to have word-finding difficulties and abnormal posturing.  EEG yesterday showed generalized slowing, though no epileptiform activity.  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  Objective: Current vital signs: BP 105/57  Pulse 106  Temp(Src) 98.2 F (36.8 C) (Oral)  Resp 20  Ht 5\' 5"  (1.651 m)  Wt 154 lb 8.7 oz (70.1 kg)  BMI 25.72 kg/m2  SpO2 99%  Neurologic Exam: General: alert, lying in bed, arms outstretched Neck: Supple CV: Tachycardic Abdomen: non-tender, non-distended Mental Status:  Awake. Can state her birthdate, but not place or time.  Significant word-finding difficulty (can't name a pen, but can say "it writes").  Pt follows commands with prompting Cranial Nerves:  II - does blink to threat from left but unclear if blinks to threat on right.  L pupil 6 mm, R pupil 3 mm [name]/IV/VI - PERRL, EOMI  V/VII - no facial numbness or weakness  VIII - hearing intact IX,X - palate elevates bilaterally, normal speech XI: trapezius strength/neck flexion strength normal bilaterally XII: tongue strength normal  Motor: noticeable posturing with arms outstretched, but can move all 4 extremities spontaneously Sensory: intact Deep Tendon Reflexes: 1+ bilaterally  Carotid auscultation: No bruit   Medications:  Scheduled Meds: . cefTRIAXone (ROCEPHIN)  IV  1 g Intravenous Q24H  . folic acid  1 mg Intravenous Daily  . influenza vac split quadrivalent PF  0.5 mL Intramuscular Tomorrow-1000  . pantoprazole (PROTONIX) IV  40 mg Intravenous Q12H  . thiamine  100 mg Intravenous Daily   Continuous Infusions: . sodium chloride 75 mL/hr at 05/13/13 2013   PRN Meds:.acetaminophen, HYDROmorphone (DILAUDID) injection, ondansetron (ZOFRAN) IV   Assessment/Plan: The patient  is a 52 yo woman, history of HTN, fibromyalgia, anxiety, presenting with abnormal posturing, now more alert though with word-finding difficulties.  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  EEG negative, CT negative. -consulted PCCM for LP -ordered 14-3-3 and Tau proteins on CSF -will also check CSF for cell count/differential, protein, and glucose -no contact or other isolation precautions needed at this time -continue folic acid replacement  Janalyn Harder, PGY3 05/14/2013  10:03 AM  Addendum 11:00 am: Bedside LP attempted by PCCM, but due to patient body habitus, unable to obtain CSF (scant bloody discharge).  Will send patient to IR for fluoro-guided LP.  This patient was evaluated by me along with the internal medicine resident, and I concur with the above clinical assessment and management recommendations.  Venetia Maxon M.D. [facility] [phone]

## 2013-05-14 NOTE — Care Management Note (Signed)
    Page 1 of 1   05/14/2013     11:49:07 AM   CARE MANAGEMENT NOTE 05/14/2013  Patient:  Joan [name], Joan [name]   Account Number:  [id number]  Date Initiated:  05/14/2013  Documentation initiated by:  Donn Pierini  Subjective/Objective Assessment:   Pt admitted wtih sepsis/ AMS     Action/Plan:   PTA pt lived at home with sons, will need PT/OT evals when awake and medically stable to assist with d/c needs and planning   Anticipated DC Date:  05/17/2013   Anticipated DC Plan:  SKILLED NURSING FACILITY  In-house referral  Clinical Social Worker      DC Planning Services  CM consult      Choice offered to / List presented to:             Status of service:  In process, will continue to follow Medicare Important Message given?   (If response is "NO", the following Medicare IM given date fields will be blank) Date Medicare IM given:   Date Additional Medicare IM given:    Discharge Disposition:    Per UR Regulation:  Reviewed for med. necessity/level of care/duration of stay  If discussed at Long Length of Stay Meetings, dates discussed:    Comments:

## 2013-05-14 NOTE — Progress Notes (Signed)
CRITICAL VALUE ALERT  Critical value received: CSF WBC 26  Date of notification:  05/14/2013  Time of notification:  1700  Critical value read back: yes  Nurse who received alert:  Tereasa Coop, RN  MD notified (1st page):  Dr. Butler Denmark  Time of first page:  1700  Responding MD:  Dr. Butler Denmark   Time MD responded:  [phone]

## 2013-05-14 NOTE — Progress Notes (Signed)
[facility] Progress Note [place] TEAM 1 - Stepdown/ICU TEAM   Joan [name] UUV:[id number] DOB: [birth year] DOA: 05/11/2013 PCP: No primary provider on file.  Admit HPI / Brief Narrative: 52 y.o. female who was brought to the ED after having decreased responsiveness for 2-3 days. She was unresponsive at presentation and her son gave the history. 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 He stated that she has not had any food or liquids for the last 203 days. She was diagnosed with a UTI on 04/05/2013 and was prescribed Bactrim therapy but she did not take the medication due to increased nausea and vomiting. Her son also reported that prior to this she had been falling down a lot and had sustained multiple bruises from her falls.   In the ED, she was evaluated and found to be minimally responsive and febrile. She was found to have a UTI, and and increased lactic acid level of 5.  She was placed on IV Rocephin and IVFs for rehydration. A CT scan of the head was performed and was found to be negative for acute findings.   HPI/Subjective: Patient awake and trying to respond verbally but has gibberrish speech; - son in room - briefly updated on MRI results  Assessment/Plan:  Toxic metabolic encephalopathy -Initially felt to be 2/2 combination of encephalopathy related to pyelonephritis and possible psychiatric issues/catatonia  - no acute findings on CT scan of the head  - Neuro rec MRI brain (asymmetric pupils) which was concerning for either hypoxic vs JC etiology -PCCM consulted for LP but unable to accomplish so plan is to repeat under fluoroscopy -KVQ:[id number] with moderately severe continuous generalized nonspecific slowing of cerebral activity. This pattern of slowing can be seen with a wide variety of encephalopathic processes, including metabolic, toxic and degenerative encephalopathies. No evidence of an epileptic disorder was  demonstrated. - CSF consistent with encephalitis -Cortisol 19.8 and thiamine levels  Acute renal failure -Likely simply prerenal azotemia related to dehydration due to poor intake  - improving with hydration  - continue to follow  Hypernatremia - cont 1/2 NS which has been effective   Metabolic acidosis -Due to acute renal failure  - seems more precipitated by hyperchloremia  ??UTI / pyelonephritis  -Continue empiric antibiotic therapy  - cx not successful in identifying offending organism  Hx of catatonia -Son reported two prior episodes but states they have been quite infrequent  - does not appear to be followed chronically by Psychiatry or Neurology  Code Status: FULL Family Communication: Briefly spoke with son at bedside Disposition Plan: SDU  Consultants: Neurology  Procedures: none  Antibiotics: Rocephin 12/13 >>  DVT prophylaxis: lovenox  Objective: Blood pressure 100/72, pulse 106, temperature 98.6 F (37 C), temperature source Oral, resp. rate 25, height 5\' 5"  (1.651 m), weight 154 lb 8.7 oz (70.1 kg), SpO2 100.00%.  Intake/Output Summary (Last 24 hours) at 05/14/13 1429 Last data filed at 05/14/13 1400  Gross per 24 hour  Intake 2227.92 ml  Output    975 ml  Net 1252.92 ml   Exam: General: No acute respiratory distress Lungs: Clear to auscultation bilaterally without wheezes or crackles Cardiovascular: Regular rate and rhythm without murmur gallop or rub normal S1 and S2 Abdomen: Nontender, nondistended, soft, bowel sounds positive, no rebound, no ascites, no appreciable mass Extremities: No significant cyanosis, clubbing, or edema bilateral lower extremities-noted reproducible left shoulder pain with PROM Neurological: Awake with profound proximal extremity weakness  esp.in arms, speech is nonsensical and ? expressive aphasia, paresthesia feet with exam  Data Reviewed: Basic Metabolic Panel:  Recent Labs Lab 05/11/13 1755 05/11/13 1802  05/12/13 0640 05/13/13 0345 05/14/13 0450  NA 149* 149* 148* 152* 147*  K 3.6 3.5 3.4* 3.1* 3.5  CL 107 111 115* 119* 116*  CO2 20  --  17* 15* 18*  GLUCOSE 118* 120* 114* 94 64*  BUN 55* 51* 45* 31* 17  CREATININE 1.80* 2.00* 1.28* 1.08 0.89  CALCIUM 9.7  --  8.1* 8.1* 8.4  MG  --   --   --  2.1  --    Liver Function Tests:  Recent Labs Lab 05/11/13 1755 05/13/13 0345 05/14/13 0450  AST 38* 24 26  ALT 29 17 16   ALKPHOS 53 36* 36*  BILITOT 1.4* 0.6 0.4  PROT 8.6* 6.1 5.7*  ALBUMIN 4.0 2.7* 2.6*    Recent Labs Lab 05/13/13 0345  AMMONIA 22    CBC:  Recent Labs Lab 05/11/13 1755 05/11/13 1802 05/12/13 0640 05/13/13 0345 05/14/13 0450 05/14/13 1215  WBC 8.8  --  8.4 5.8 5.4 5.5  NEUTROABS 6.1  --   --   --   --   --   HGB 14.3 14.3 11.1* 10.2* 8.5* 9.0*  HCT 41.9 42.0 33.3* 30.6* 25.5* 26.9*  MCV 89.9  --  89.5 90.3 90.7 91.8  PLT 53*  --  42* 38* 40* 43*   Cardiac Enzymes:  Recent Labs Lab 05/11/13 1755 05/13/13 1755  CKTOTAL  --  79  TROPONINI <0.30  --    CBG:  Recent Labs Lab 05/11/13 1750  GLUCAP 116*    Recent Results (from the past 240 hour(s))  URINE CULTURE     Status: None   Collection Time    05/11/13  7:02 PM      Result Value Range Status   Specimen Description URINE, CATHETERIZED   Final   Special Requests ADDED 0503 05/12/13   Final   Culture  Setup Time     Final   Value: 05/11/2013 05:40     Performed at [facility]   Colony Count     Final   Value: NO GROWTH     Performed at [facility]   Culture     Final   Value: NO GROWTH     Performed at [facility]   Report Status 05/13/2013 FINAL   Final  MRSA PCR SCREENING     Status: None   Collection Time    05/11/13 10:30 PM      Result Value Range Status   MRSA by PCR NEGATIVE  NEGATIVE Final   Comment:            The GeneXpert MRSA Assay (FDA     approved for NASAL specimens     only), is one component of a     comprehensive MRSA  colonization     surveillance program. It is not     intended to diagnose MRSA     infection nor to guide or     monitor treatment for     MRSA infections.     Studies:  Recent x-ray studies have been reviewed in detail by the Attending Physician  Scheduled Meds:  Scheduled Meds: . cefTRIAXone (ROCEPHIN)  IV  1 g Intravenous Q24H  . folic acid  1 mg Intravenous Daily  . influenza vac split quadrivalent PF  0.5 mL Intramuscular Tomorrow-1000  .  pantoprazole (PROTONIX) IV  40 mg Intravenous Q12H  . thiamine  100 mg Intravenous Daily    Time spent on care of this patient: 35 mins   ELLIS,ALLISON L. ANP  [facility] Office  [phone] Pager - Text Page per Loretha Stapler as per below:  On-Call/Text Page:      [email or web address]      password [email or web address]  If 7PM-7AM, please contact night-coverage [email or web address] Password [email or web address] 05/14/2013, 2:29 PM   LOS: 3 days    I have examined the patient, reviewed the chart and modified the above note which I agree with.   Mateya Torti,MD [phone] 05/14/2013, 6:44 PM

## 2013-05-15 DIAGNOSIS — G0490 Encephalitis and encephalomyelitis, unspecified: Secondary | ICD-10-CM

## 2013-05-15 LAB — CBC
HCT: 27.4 % — ABNORMAL LOW (ref 36.0–46.0)
Hemoglobin: 9.1 g/dL — ABNORMAL LOW (ref 12.0–15.0)
MCH: 29.9 pg (ref 26.0–34.0)
MCV: 90.1 fL (ref 78.0–100.0)
Platelets: 42 10*3/uL — ABNORMAL LOW (ref 150–400)
RBC: 3.04 MIL/uL — ABNORMAL LOW (ref 3.87–5.11)
RDW: 14 % (ref 11.5–15.5)
WBC: 5.5 10*3/uL (ref 4.0–10.5)

## 2013-05-15 LAB — BASIC METABOLIC PANEL
Calcium: 8.3 mg/dL — ABNORMAL LOW (ref 8.4–10.5)
Chloride: 105 mEq/L (ref 96–112)
Creatinine, Ser: 0.72 mg/dL (ref 0.50–1.10)
GFR calc Af Amer: >90 mL/min (ref >90)
GFR calc non Af Amer: >90 mL/min (ref >90)
Sodium: 140 mEq/L (ref 135–145)

## 2013-05-15 LAB — PATHOLOGIST SMEAR REVIEW

## 2013-05-15 MED ORDER — POTASSIUM CHLORIDE 10 MEQ/100ML IV SOLN
10.0000 meq | INTRAVENOUS | Status: AC
  Administered 2013-05-15 – 2013-05-16 (×3): 10 meq via INTRAVENOUS
  Filled 2013-05-15: qty 100

## 2013-05-15 NOTE — Progress Notes (Signed)
Subjective: Patient awake and alert.  Continues to be slow answering questions.   Reports that she is sore and not moving the RUE well.    Objective: Current vital signs: BP 120/72  Pulse 106  Temp(Src) 99.4 F (37.4 C) (Oral)  Resp 27  Ht 5\' 5"  (1.651 m)  Wt 70.1 kg (154 lb 8.7 oz)  BMI 25.72 kg/m2  SpO2 100% Vital signs in last 24 hours: Temp:  [98.4 F (36.9 C)-99.4 F (37.4 C)] 99.4 F (37.4 C) (12/17 0343) Pulse Rate:  [106-107] 106 (12/17 0343) Resp:  [25-28] 27 (12/17 0343) BP: (100-120)/(69-75) 120/72 mmHg (12/17 0343) SpO2:  [100 %] 100 % (12/17 0343)  Intake/Output from previous day: 12/16 0701 - 12/17 0700 In: 1425 [I.V.:1425] Out: 650 [Urine:650] Intake/Output this shift:   Nutritional status: NPO  Neurologic Exam: Mental Status: Alert.  Slow to respond.  Word finding difficulties. Speech fluent.  Able to follow simple commands without difficulty. Cranial Nerves: II: Discs flat bilaterally; Visual fields grossly normal, pupils equal, round, reactive to light and accommodation [name],IV, VI: ptosis not present, extra-ocular motions intact bilaterally V,VII: smile symmetric, facial light touch sensation normal bilaterally VIII: hearing normal bilaterally IX,X: gag reflex present XI: bilateral shoulder shrug XII: midline tongue extension Motor: Moves all extremities against gravity except the RUE.  Has some bruising in the RUE and some swelling.  Does not really lift the arm off the bed but does squeeze my hand lightly.   Sensory: Pinprick intact throughout, bilaterally Deep Tendon Reflexes: 1+ and symmetric throughout Plantars: Right: downgoing   Left: downgoing   Lab Results: Basic Metabolic Panel:  Recent Labs Lab 05/11/13 1755 05/11/13 1802 05/12/13 0640 05/13/13 0345 05/14/13 0450  NA 149* 149* 148* 152* 147*  K 3.6 3.5 3.4* 3.1* 3.5  CL 107 111 115* 119* 116*  CO2 20  --  17* 15* 18*  GLUCOSE 118* 120* 114* 94 64*  BUN 55* 51* 45* 31* 17   CREATININE 1.80* 2.00* 1.28* 1.08 0.89  CALCIUM 9.7  --  8.1* 8.1* 8.4  MG  --   --   --  2.1  --     Liver Function Tests:  Recent Labs Lab 05/11/13 1755 05/13/13 0345 05/14/13 0450  AST 38* 24 26  ALT 29 17 16   ALKPHOS 53 36* 36*  BILITOT 1.4* 0.6 0.4  PROT 8.6* 6.1 5.7*  ALBUMIN 4.0 2.7* 2.6*   No results found for this basename: LIPASE, AMYLASE,  in the last 168 hours  Recent Labs Lab 05/13/13 0345  AMMONIA 22    CBC:  Recent Labs Lab 05/11/13 1755 05/11/13 1802 05/12/13 0640 05/13/13 0345 05/14/13 0450 05/14/13 1215  WBC 8.8  --  8.4 5.8 5.4 5.5  NEUTROABS 6.1  --   --   --   --   --   HGB 14.3 14.3 11.1* 10.2* 8.5* 9.0*  HCT 41.9 42.0 33.3* 30.6* 25.5* 26.9*  MCV 89.9  --  89.5 90.3 90.7 91.8  PLT 53*  --  42* 38* 40* 43*    Cardiac Enzymes:  Recent Labs Lab 05/11/13 1755 05/13/13 1755  CKTOTAL  --  79  TROPONINI <0.30  --     Lipid Panel: No results found for this basename: CHOL, TRIG, HDL, CHOLHDL, VLDL, LDLCALC,  in the last 168 hours  CBG:  Recent Labs Lab 05/11/13 1750  GLUCAP 116*    Microbiology: Results for orders placed during the hospital encounter of 05/11/13  URINE CULTURE  Status: None   Collection Time    05/11/13  7:02 PM      Result Value Range Status   Specimen Description URINE, CATHETERIZED   Final   Special Requests ADDED 0503 05/12/13   Final   Culture  Setup Time     Final   Value: 05/11/2013 05:40     Performed at [facility]   Colony Count     Final   Value: NO GROWTH     Performed at [facility]   Culture     Final   Value: NO GROWTH     Performed at [facility]   Report Status 05/13/2013 FINAL   Final  MRSA PCR SCREENING     Status: None   Collection Time    05/11/13 10:30 PM      Result Value Range Status   MRSA by PCR NEGATIVE  NEGATIVE Final   Comment:            The GeneXpert MRSA Assay (FDA     approved for NASAL specimens     only), is one component of a      comprehensive MRSA colonization     surveillance program. It is not     intended to diagnose MRSA     infection nor to guide or     monitor treatment for     MRSA infections.    Coagulation Studies:  Recent Labs  05/14/13 1215  LABPROT 16.9*  INR 1.41    Imaging: Mr Lodema Pilot Contrast  05/13/2013   CLINICAL DATA:  52 year old female with decreased responsiveness and altered mental status. Currently unresponsive. UTI last month. Nausea vomiting. Initial encounter.  EXAM: MRI HEAD WITHOUT AND WITH CONTRAST  TECHNIQUE: Multiplanar, multiecho pulse sequences of the brain and surrounding structures were obtained without and with intravenous contrast.  CONTRAST:  15 mL MultiHance.  COMPARISON:  Head CTs 05/13/2013 and earlier.  FINDINGS: Abnormal T2 and FLAIR hyperintensity in the medial and posterior thalami a in a symmetric pattern associated with increased trace diffusion signal and perhaps mildly restricted diffusion. T1 signal appears relatively preserved. No associated enhancement. No associated mass effect or evidence of blood products.  No other restricted diffusion identified. Major intracranial vascular flow voids are preserved.  Intermittent mild motion artifact. No other abnormal gray or white matter signal identified in the brain. Partially empty sella configuration. No abnormal enhancement identified. No ventriculomegaly. No midline shift, mass effect, or evidence of intracranial mass lesion.  Visualized orbit soft tissues are within normal limits. Visualized paranasal sinuses and mastoids are clear. Visualized bone marrow signal is within normal limits. Negative cervicomedullary junction and visualized cervical spine. Visualized scalp soft tissues are within normal limits.  IMPRESSION: Symmetric abnormal diffusion, T2, and FLAIR signal in the pulvinar are and dorsomedial thalami. No enhancement. No other basal ganglia involvement identified, and no other abnormal signal identified in  the brain.  Top differential considerations include variant Creutzfeldt-Jakob disease, hypoxic ischemic injury with bilateral thalamic infarcts, and less likely ADEM.   Electronically Signed   By: Augusto Gamble M.D.   On: 05/13/2013 17:58   Dg Fluoro Guide Lumbar Puncture  05/14/2013   CLINICAL DATA:  Mental status changes.  EXAM: DIAGNOSTIC LUMBAR PUNCTURE UNDER FLUOROSCOPIC GUIDANCE  FLUOROSCOPY TIME:  0 min and 28 seconds.  PROCEDURE: Informed consent was obtained from the patient prior to the procedure, including potential complications of headache, allergy, and pain. With the patient prone, the lower  back was prepped with Betadine. 1% Lidocaine was used for local anesthesia. Lumbar puncture was performed at the L3-4 level using a 20 gauge needle with return of clear CSF with an opening pressure of 23 cm water. 12.28ml of CSF were obtained for laboratory studies. The patient tolerated the procedure well and there were no apparent complications.  IMPRESSION: Fluoroscopic guided lumbar puncture with 12.5 cc of clear CSF obtained for appropriate laboratory evaluation.   Electronically Signed   By: Loralie Champagne M.D.   On: 05/14/2013 15:30    Medications:  I have reviewed the patient's current medications. Scheduled: . antiseptic oral rinse  15 mL Mouth Rinse q12n4p  . cefTRIAXone (ROCEPHIN)  IV  1 g Intravenous Q24H  . chlorhexidine  15 mL Mouth Rinse BID  . folic acid  1 mg Intravenous Daily  . influenza vac split quadrivalent PF  0.5 mL Intramuscular Tomorrow-1000  . pantoprazole (PROTONIX) IV  40 mg Intravenous Q12H  . thiamine  100 mg Intravenous Daily    Assessment/Plan: Patient initially consulted due to altered mentals status.  At that time there were some abnormal involuntary movements.  Unclear if they were seizure.  Patient now awake but not at baseline.  MRI abnormal.  May represent encephalitis, post-octal phenomenon, CJD, hypoxic injury.  LP performed.  Glucose normal when compared to  serum glucose.  Protein elevated at 115.  26 wbc's, 100% lymphs.  Elevated rbc's.  EEG only significant for slowing.    Recommendations: 1.  Will continue to follow patient.  If no improvement in RUE will repeat MRI.   2.  CSF also sent for Tau and 14-3-3.  Will follow up these results.        LOS: 4 days   Thana Farr, MD [facility] [phone] 05/15/2013  8:16 AM

## 2013-05-15 NOTE — Progress Notes (Signed)
[facility] Progress Note [place] TEAM 1 - Stepdown/ICU TEAM   Candida Peeling ZOX:[id number] DOB: [birth year] DOA: 05/11/2013 PCP: No primary provider on file.  Admit HPI / Brief Narrative: 52 y.o. female who was brought to the ED after having decreased responsiveness for 2-3 days. She was unresponsive at presentation and her son gave the history. 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 He stated that she has not had any food or liquids for the last 203 days. She was diagnosed with a UTI on 04/05/2013 and was prescribed Bactrim therapy but she did not take the medication due to increased nausea and vomiting. Her son also reported that prior to this she had been falling down a lot and had sustained multiple bruises from her falls.   In the ED, she was evaluated and found to be minimally responsive and febrile. She was found to have a UTI, and and increased lactic acid level of 5.  She was placed on IV Rocephin and IVFs for rehydration. A CT scan of the head was performed and was found to be negative for acute findings.   HPI/Subjective: Patient awake and able to speak more fluently today but remains somewhat confused. Still c/o right shoulder pain  Assessment/Plan:  Toxic metabolic encephalopathy -Initially felt to be 2/2 combination of encephalopathy related to pyelonephritis and possible psychiatric issues/catatonia  - no acute findings on CT scan of the head  - MRI brain was concerning for either hypoxic vs CJD etiology -WJX:[id number] with moderately severe continuous generalized nonspecific slowing of cerebral activity. This pattern of slowing can be seen with a wide variety of encephalopathic processes, including metabolic, toxic and degenerative encephalopathies. No evidence of an epileptic disorder was demonstrated. - CSF consistent with encephalitis (Tau and 14-3-3 pending specific for CJD) -Cortisol 19.8  -folic acid low, but  not severely so - replacing   Acute renal failure -Likely simply prerenal azotemia related to dehydration due to poor intake  - improving with hydration   Hypernatremia - cont 1/2 NS which has been effective   Metabolic acidosis -resolved w/ transition to 1/2 NS IVF   UTI / pyelonephritis  - UA c/w same  -Continue empiric antibiotic therapy  - cx not successful in identifying offending organism  Hx of catatonia -Son reported two prior episodes but states they have been quite infrequent  - does not appear to be followed chronically by Psychiatry or Neurology   Code Status: FULL Family Communication: Briefly spoke with son at bedside 12/16 but no family at bedside 12/17 Disposition Plan: SDU  Consultants: Neurology  Procedures: none  Antibiotics: Rocephin 12/13 >>  DVT prophylaxis: SCDs   Objective: Blood pressure 106/51, pulse 95, temperature 98.8 F (37.1 C), temperature source Oral, resp. rate 24, height 5\' 5"  (1.651 m), weight 154 lb 8.7 oz (70.1 kg), SpO2 100.00%.  Intake/Output Summary (Last 24 hours) at 05/15/13 1304 Last data filed at 05/15/13 1244  Gross per 24 hour  Intake   1050 ml  Output   1000 ml  Net     50 ml   Exam: General: No acute respiratory distress Lungs: Clear to auscultation bilaterally without wheezes or crackles Cardiovascular: Regular rate and rhythm without murmur gallop or rub normal S1 and S2 Abdomen: Nontender, nondistended, soft, bowel sounds positive, no rebound, no ascites, no appreciable mass Extremities: No significant cyanosis, clubbing, or edema bilateral lower extremities-noted reproducible left shoulder pain with PROM Neurological: Awake with profound  proximal extremity weakness esp.in arms, speech more clear, seems confused  Data Reviewed: Basic Metabolic Panel:  Recent Labs Lab 05/11/13 1755 05/11/13 1802 05/12/13 0640 05/13/13 0345 05/14/13 0450 05/15/13 0939  NA 149* 149* 148* 152* 147* 140  K 3.6 3.5 3.4*  3.1* 3.5 3.2*  CL 107 111 115* 119* 116* 105  CO2 20  --  17* 15* 18* 20  GLUCOSE 118* 120* 114* 94 64* 73  BUN 55* 51* 45* 31* 17 9  CREATININE 1.80* 2.00* 1.28* 1.08 0.89 0.72  CALCIUM 9.7  --  8.1* 8.1* 8.4 8.3*  MG  --   --   --  2.1  --   --    Liver Function Tests:  Recent Labs Lab 05/11/13 1755 05/13/13 0345 05/14/13 0450  AST 38* 24 26  ALT 29 17 16   ALKPHOS 53 36* 36*  BILITOT 1.4* 0.6 0.4  PROT 8.6* 6.1 5.7*  ALBUMIN 4.0 2.7* 2.6*    Recent Labs Lab 05/13/13 0345  AMMONIA 22    CBC:  Recent Labs Lab 05/11/13 1755  05/12/13 0640 05/13/13 0345 05/14/13 0450 05/14/13 1215 05/15/13 0939  WBC 8.8  --  8.4 5.8 5.4 5.5 5.5  NEUTROABS 6.1  --   --   --   --   --   --   HGB 14.3  < > 11.1* 10.2* 8.5* 9.0* 9.1*  HCT 41.9  < > 33.3* 30.6* 25.5* 26.9* 27.4*  MCV 89.9  --  89.5 90.3 90.7 91.8 90.1  PLT 53*  --  42* 38* 40* 43* 42*  < > = values in this interval not displayed. Cardiac Enzymes:  Recent Labs Lab 05/11/13 1755 05/13/13 1755  CKTOTAL  --  79  TROPONINI <0.30  --    CBG:  Recent Labs Lab 05/11/13 1750  GLUCAP 116*    Recent Results (from the past 240 hour(s))  URINE CULTURE     Status: None   Collection Time    05/11/13  7:02 PM      Result Value Range Status   Specimen Description URINE, CATHETERIZED   Final   Special Requests ADDED 0503 05/12/13   Final   Culture  Setup Time     Final   Value: 05/11/2013 05:40     Performed at [facility]   Colony Count     Final   Value: NO GROWTH     Performed at [facility]   Culture     Final   Value: NO GROWTH     Performed at [facility]   Report Status 05/13/2013 FINAL   Final  MRSA PCR SCREENING     Status: None   Collection Time    05/11/13 10:30 PM      Result Value Range Status   MRSA by PCR NEGATIVE  NEGATIVE Final   Comment:            The GeneXpert MRSA Assay (FDA     approved for NASAL specimens     only), is one component of a      comprehensive MRSA colonization     surveillance program. It is not     intended to diagnose MRSA     infection nor to guide or     monitor treatment for     MRSA infections.     Studies:  Recent x-ray studies have been reviewed in detail by the Attending Physician  Scheduled Meds:  Scheduled Meds: .  antiseptic oral rinse  15 mL Mouth Rinse q12n4p  . cefTRIAXone (ROCEPHIN)  IV  1 g Intravenous Q24H  . chlorhexidine  15 mL Mouth Rinse BID  . folic acid  1 mg Intravenous Daily  . influenza vac split quadrivalent PF  0.5 mL Intramuscular Tomorrow-1000  . pantoprazole (PROTONIX) IV  40 mg Intravenous Q12H  . thiamine  100 mg Intravenous Daily    Time spent on care of this patient: 35 mins   ELLIS,ALLISON L. ANP  [facility] Office  [phone] Pager - Text Page per Loretha Stapler as per below:  On-Call/Text Page:      [email or web address]      password [email or web address]  If 7PM-7AM, please contact night-coverage [email or web address] Password [email or web address] 05/15/2013, 1:04 PM   LOS: 4 days   I have personally examined this patient and reviewed the entire database. I have reviewed the above note, made any necessary editorial changes, and agree with its content.  Lonia Blood, MD [facility]

## 2013-05-15 NOTE — Clinical Social Work Placement (Signed)
Clinical Social Work Department CLINICAL SOCIAL WORK PLACEMENT NOTE 05/15/2013  Patient:  Joan [name], Joan [name]  Account Number:  [id number] Admit date:  05/11/2013  Clinical Social Worker:  Cherre Blanc, [place]  Date/time:  05/14/2013 03:26 PM  Clinical Social Work is seeking post-discharge placement for this patient at the following level of care:   SKILLED NURSING   (*CSW will update this form in Epic as items are completed)   05/14/2013  Patient/family provided with [facility] Department of Clinical Social Work's list of facilities offering this level of care within the geographic area requested by the patient (or if unable, by the patient's family).  05/14/2013  Patient/family informed of their freedom to choose among providers that offer the needed level of care, that participate in Medicare, Medicaid or managed care program needed by the patient, have an available bed and are willing to accept the patient.  05/14/2013  Patient/family informed of [facility]' ownership interest in [facility], as well as of the fact that they are under no obligation to receive care at this facility.  PASARR submitted to [employer] on  PASARR number received from [employer] on   FL2 transmitted to all facilities in geographic area requested by pt/family on  05/14/2013 FL2 transmitted to all facilities within larger geographic area on   Patient informed that his/her managed care company has contracts with or will negotiate with  certain facilities, including the following:     Patient/family informed of bed offers received:  05/15/2013 Patient chooses bed at  Physician recommends and patient chooses bed at    Patient to be transferred to  on   Patient to be transferred to facility by   The following physician request were entered in Epic:   Additional Comments:   Roddie Mc, Bryon Lions, [phone]

## 2013-05-16 ENCOUNTER — Inpatient Hospital Stay (HOSPITAL_COMMUNITY): Payer: Medicare Other

## 2013-05-16 LAB — COMPREHENSIVE METABOLIC PANEL
ALT: 19 U/L (ref 0–35)
CO2: 21 mEq/L (ref 19–32)
Calcium: 8.5 mg/dL (ref 8.4–10.5)
Chloride: 102 mEq/L (ref 96–112)
Creatinine, Ser: 0.65 mg/dL (ref 0.50–1.10)
GFR calc Af Amer: >90 mL/min (ref >90)
GFR calc non Af Amer: >90 mL/min (ref >90)
Glucose, Bld: 69 mg/dL — ABNORMAL LOW (ref 70–99)
Potassium: 3.8 mEq/L (ref 3.5–5.1)
Total Bilirubin: 0.6 mg/dL (ref 0.3–1.2)

## 2013-05-16 LAB — CBC
Hemoglobin: 9.7 g/dL — ABNORMAL LOW (ref 12.0–15.0)
MCHC: 34.6 g/dL (ref 30.0–36.0)
Platelets: 51 10*3/uL — ABNORMAL LOW (ref 150–400)

## 2013-05-16 NOTE — Progress Notes (Signed)
Subjective: Patient unchanged from yesterday.  Remains delayed in her responses.   Continues to complain of pain even with mild stimulation.    Objective: Current vital signs: BP 97/59  Pulse 117  Temp(Src) 98.5 F (36.9 C) (Oral)  Resp 24  Ht 5\' 5"  (1.651 m)  Wt 70.1 kg (154 lb 8.7 oz)  BMI 25.72 kg/m2  SpO2 100% Vital signs in last 24 hours: Temp:  [97.6 F (36.4 C)-99.1 F (37.3 C)] 98.5 F (36.9 C) (12/18 1601) Pulse Rate:  [94-117] 117 (12/18 1145) Resp:  [15-24] 24 (12/18 1145) BP: (97-139)/(59-81) 97/59 mmHg (12/18 1145) SpO2:  [98 %-100 %] 100 % (12/18 1145)  Intake/Output from previous day: 12/17 0701 - 12/18 0700 In: -  Out: 1150 [Urine:1150] Intake/Output this shift: Total I/O In: -  Out: 750 [Urine:750] Nutritional status: Dysphagia  Neurologic Exam: Mental Status:  Alert. Slow to respond. Word finding difficulties. Speech fluent. Able to follow simple commands without difficulty.  Cranial Nerves:  II: Discs flat bilaterally; Visual fields grossly normal, pupils equal, round, reactive to light and accommodation  [name],IV, VI: ptosis not present, extra-ocular motions intact bilaterally  V,VII: smile symmetric, facial light touch sensation normal bilaterally  VIII: hearing normal bilaterally  IX,X: gag reflex present  XI: bilateral shoulder shrug  XII: midline tongue extension  Motor:  Moves all extremities against gravity except the RUE. Has some bruising in the RUE and some swelling. Does not really lift the arm off the bed but does squeeze my hand lightly.  Sensory: Pinprick intact throughout, bilaterally  Deep Tendon Reflexes: 1+ and symmetric throughout  Plantars:  Right: downgoing Left: downgoing   Lab Results: Basic Metabolic Panel:  Recent Labs Lab 05/12/13 0640 05/13/13 0345 05/14/13 0450 05/15/13 0939 05/16/13 0940  NA 148* 152* 147* 140 138  K 3.4* 3.1* 3.5 3.2* 3.8  CL 115* 119* 116* 105 102  CO2 17* 15* 18* 20 21  GLUCOSE 114* 94  64* 73 69*  BUN 45* 31* 17 9 7   CREATININE 1.28* 1.08 0.89 0.72 0.65  CALCIUM 8.1* 8.1* 8.4 8.3* 8.5  MG  --  2.1  --   --   --     Liver Function Tests:  Recent Labs Lab 05/11/13 1755 05/13/13 0345 05/14/13 0450 05/16/13 0940  AST 38* 24 26 34  ALT 29 17 16 19   ALKPHOS 53 36* 36* 41  BILITOT 1.4* 0.6 0.4 0.6  PROT 8.6* 6.1 5.7* 6.1  ALBUMIN 4.0 2.7* 2.6* 2.8*   No results found for this basename: LIPASE, AMYLASE,  in the last 168 hours  Recent Labs Lab 05/13/13 0345  AMMONIA 22    CBC:  Recent Labs Lab 05/11/13 1755  05/13/13 0345 05/14/13 0450 05/14/13 1215 05/15/13 0939 05/16/13 0940  WBC 8.8  < > 5.8 5.4 5.5 5.5 5.2  NEUTROABS 6.1  --   --   --   --   --   --   HGB 14.3  < > 10.2* 8.5* 9.0* 9.1* 9.7*  HCT 41.9  < > 30.6* 25.5* 26.9* 27.4* 28.0*  MCV 89.9  < > 90.3 90.7 91.8 90.1 88.1  PLT 53*  < > 38* 40* 43* 42* 51*  < > = values in this interval not displayed.  Cardiac Enzymes:  Recent Labs Lab 05/11/13 1755 05/13/13 1755  CKTOTAL  --  79  TROPONINI <0.30  --     Lipid Panel: No results found for this basename: CHOL, TRIG, HDL, CHOLHDL, VLDL,  LDLCALC,  in the last 168 hours  CBG:  Recent Labs Lab 05/11/13 1750  GLUCAP 116*    Microbiology: Results for orders placed during the hospital encounter of 05/11/13  URINE CULTURE     Status: None   Collection Time    05/11/13  7:02 PM      Result Value Range Status   Specimen Description URINE, CATHETERIZED   Final   Special Requests ADDED 0503 05/12/13   Final   Culture  Setup Time     Final   Value: 05/11/2013 05:40     Performed at [facility]   Colony Count     Final   Value: NO GROWTH     Performed at [facility]   Culture     Final   Value: NO GROWTH     Performed at [facility]   Report Status 05/13/2013 FINAL   Final  MRSA PCR SCREENING     Status: None   Collection Time    05/11/13 10:30 PM      Result Value Range Status   MRSA by PCR NEGATIVE   NEGATIVE Final   Comment:            The GeneXpert MRSA Assay (FDA     approved for NASAL specimens     only), is one component of a     comprehensive MRSA colonization     surveillance program. It is not     intended to diagnose MRSA     infection nor to guide or     monitor treatment for     MRSA infections.    Coagulation Studies:  Recent Labs  05/14/13 1215  LABPROT 16.9*  INR 1.41    Imaging: No results found.  Medications:  I have reviewed the patient's current medications. Scheduled: . antiseptic oral rinse  15 mL Mouth Rinse q12n4p  . cefTRIAXone (ROCEPHIN)  IV  1 g Intravenous Q24H  . chlorhexidine  15 mL Mouth Rinse BID  . folic acid  1 mg Intravenous Daily  . influenza vac split quadrivalent PF  0.5 mL Intramuscular Tomorrow-1000  . pantoprazole (PROTONIX) IV  40 mg Intravenous Q12H  . thiamine  100 mg Intravenous Daily    Assessment/Plan: Remains unchanged.  LP results remain pending.  May benefit from repeat imaging to follow up abnormalities noted on initial MRI.  Recommendations: 1.  Repeat MRI of the head  Case discussed with Dr. Butler Denmark   LOS: 5 days   Thana Farr, MD [facility] [phone] 05/16/2013  4:10 PM

## 2013-05-16 NOTE — Evaluation (Signed)
Speech Language Pathology Evaluation Patient Details Name: Joan [name] MRN: [id number] DOB: 07/04/1960 Today's Date: 05/16/2013 Time: 0920-0930 SLP Time Calculation (min): 10 min  Problem List:  Patient Active Problem List   Diagnosis Date Noted  . Encephalitis 05/14/2013  . UTI (lower urinary tract infection) 05/11/2013  . Sepsis 05/11/2013  . Acute encephalopathy 05/11/2013  . Dehydration 05/11/2013  . Hypernatremia 05/11/2013  . Metabolic acidosis 05/11/2013  . AKI (acute kidney injury) 05/11/2013   Past Medical History:  Past Medical History  Diagnosis Date  . Peripheral neuropathy   . Fibromyalgia   . Hypertension   . Anxiety   . Catatonia    Past Surgical History:  Past Surgical History  Procedure Laterality Date  . Gastric bypass    . Abdominal hysterectomy     HPI:  52 y.o. female with history of fibromyalgia, anxiety, catatonia, gastric bypass who was brought to the ED after having decreased responsiveness for the past 2-3 days. She is unresponsive at this time and her son gives the history. 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 He states that she has not had any food or liquids during this time as well. She was diagnosed with a UTI on 04/05/2013 and was prescribed Bactrim therapy but she did not take the medication due to increased nausea and vomiting.  In the ED, she was evaluated and found to be minimally responsive and febrile. She was found to have a UTI, A CT scan of the head was performed and was found to be negative for acute findings.  CXR No acute  cardiopulmonary disease.   Assessment / Plan / Recommendation Clinical Impression  Pt. exhibited moderate dysarthria with increase rate of speech and decreased labial/lingual ROM during speech.  Cognitive deficits appear mod-severe in the areas of attention, temporal orientation, problem solving, awareness, self monitoring, memory and pragmatics.  Pt. would  benefit from short term ST has documentation in chart specifies change in cognition per family.    SLP Assessment  Patient needs continued Speech Lanaguage Pathology Services    Follow Up Recommendations   (TBD)    Frequency and Duration min 2x/week  2 weeks   Pertinent Vitals/Pain WDL   SLP Goals  SLP Goals Potential to Achieve Goals: Fair Potential Considerations: Ability to learn/carryover information;Co-morbidities  SLP Evaluation Prior Functioning  Cognitive/Linguistic Baseline: Information not available (suspect impairments) Vocation: Unemployed   Cognition  Overall Cognitive Status: Impaired/Different from baseline (no family to confirm, suspect deficits have increased) Arousal/Alertness: Awake/alert Orientation Level: Oriented to place;Disoriented to time;Disoriented to situation;Oriented to person Attention: Sustained Sustained Attention: Impaired Sustained Attention Impairment: Verbal basic;Functional basic Memory: Impaired Awareness: Impaired Awareness Impairment: Intellectual impairment;Emergent impairment;Anticipatory impairment Problem Solving: Impaired Problem Solving Impairment: Verbal basic;Functional basic Safety/Judgment: Impaired    Comprehension  Auditory Comprehension Overall Auditory Comprehension: Appears within functional limits for tasks assessed Yes/No Questions: Within Functional Limits Commands: Within Functional Limits Interfering Components: Processing speed;Attention Visual Recognition/Discrimination Discrimination: Not tested Reading Comprehension Reading Status: Not tested    Expression Expression Primary Mode of Expression: Verbal Verbal Expression Overall Verbal Expression: Impaired Initiation: No impairment Level of Generative/Spontaneous Verbalization: Sentence Naming: Not tested Pragmatics: Impairment Impairments: Topic maintenance Written Expression Written Expression: Not tested   Oral / Motor Oral Motor/Sensory  Function Overall Oral Motor/Sensory Function: Appears within functional limits for tasks assessed Motor Speech Overall Motor Speech: Appears within functional limits for tasks assessed Respiration: Within functional limits Phonation: Low vocal intensity  Resonance: Within functional limits Articulation: Within functional limitis Intelligibility: Intelligibility reduced Word: 50-74% accurate Phrase: 25-49% accurate Sentence: 25-49% accurate Motor Planning: Witnin functional limits   GO     Breck Coons [employer]Ed [employer] [phone]  05/16/2013

## 2013-05-16 NOTE — Evaluation (Signed)
Physical Therapy Evaluation Patient Details Name: Joan [name] MRN: [id number] DOB: Mar 31, 1961 Today's Date: 05/16/2013 Time: 4098-1191 PT Time Calculation (min): 38 min  PT Assessment / Plan / Recommendation History of Present Illness  Joan [name] is a 52 y.o. female who was brought to the ED after having decreased responsiveness for the past 2-3 days.  She is unresponsive at this time and her son gives the history.  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  He states that she has not had any food or liquids during this time as well.   She was diagnosed with a UTI on 04/05/2013 and was prescribed Bactrim therapy but she did not take the medication due to increased nausea and vomiting.   Her son also reports that prior to this she had been falling down a lot and has multiple bruises from her falls.   Clinical Impression  Patient presents with decreased mobility due to deficits listed below.  Patient will benefit from skilled PT in the acute setting to decrease burden of care prior to d/c to SNF level rehab.  Difficult to determine focal weakness versus psychological deficits.  Patient known to me from outpatient setting with some intermittent conversion symptoms.    PT Assessment  Patient needs continued PT services    Follow Up Recommendations  SNF    Does the patient have the potential to tolerate intense rehabilitation    N/a  Barriers to Discharge Decreased caregiver support      Equipment Recommendations  Other (comment) (TBA)    Recommendations for Other Services   None  Frequency Min 3X/week    Precautions / Restrictions Precautions Precautions: Fall Restrictions Weight Bearing Restrictions: No   Pertinent Vitals/Pain C/o pain with movement of right arm and esp in genitalia with hygiene      Mobility  Bed Mobility Bed Mobility: Supine to Sit;Sitting - Scoot to Edge of Bed Supine to Sit: 2: Max assist Sitting - Scoot  to [place] of Bed: 1: +1 Total assist Details for Bed Mobility Assistance: pt assisted a little with legs off bed and trunk upright Transfers Transfers: Heritage manager Transfers: 1: +1 Total assist Details for Transfer Assistance: pt with poor LE effort with slow processing and delayed motor responses as well as weakness Ambulation/Gait Ambulation/Gait Assistance: Not tested (comment)    Exercises     PT Diagnosis: Generalized weakness;Altered mental status  PT Problem List: Decreased strength;Decreased cognition;Impaired tone;Decreased activity tolerance;Decreased balance;Decreased mobility PT Treatment Interventions: DME instruction;Balance training;Functional mobility training;Patient/family education;Therapeutic activities;Therapeutic exercise     PT Goals(Current goals can be found in the care plan section) Acute Rehab PT Goals Patient Stated Goal: Need to get better PT Goal Formulation: With patient Time For Goal Achievement: 05/30/13 Potential to Achieve Goals: Good  Visit Information  Last PT Received On: 05/16/13 Assistance Needed: +2 History of Present Illness: Joan [name] is a 52 y.o. female who was brought to the ED after having decreased responsiveness for the past 2-3 days.  She is unresponsive at this time and her son gives the history.  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  He states that she has not had any food or liquids during this time as well.   She was diagnosed with a UTI on 04/05/2013 and was prescribed Bactrim therapy but she did not take the medication due to increased nausea and vomiting.  Her son also reports that prior to this she had been falling down a lot and has multiple bruises from her falls.        Prior Functioning  Home Living Family/patient expects to be discharged to:: Private residence Living Arrangements: Children Available Help at Discharge: Family;Available  PRN/intermittently Additional Comments: pt with inconsistent responses, unsure of specific home situation; states son works and maybe others come to check on her during the day; admits cannot care for herself Prior Function Level of Independence: Needs assistance Communication Communication: No difficulties Dominant Hand: Right    Cognition  Cognition Arousal/Alertness: Awake/alert Overall Cognitive Status: Impaired/Different from baseline Area of Impairment: Orientation;Attention;Following commands;Problem solving Orientation Level: Time;Situation Current Attention Level: Focused Following Commands: Follows one step commands inconsistently;Follows one step commands with increased time Problem Solving: Slow processing;Decreased initiation    Extremity/Trunk Assessment Upper Extremity Assessment Upper Extremity Assessment: RUE deficits/detail;LUE deficits/detail RUE Deficits / Details: Poor effort with all U/LE movement, PROM/AAROM grossly WFL, but sore all over; unable to muscle test due to poor effort and difficulty following commands LUE Deficits / Details: Poor effort with all U/LE movement, PROM/AAROM grossly WFL, but sore all over; unable to muscle test due to poor effort and difficulty following commands Lower Extremity Assessment Lower Extremity Assessment: RLE deficits/detail;LLE deficits/detail RLE Deficits / Details: Poor effort with all U/LE movement, PROM/AAROM grossly WFL, but sore all over; unable to muscle test due to poor effort and difficulty following commands RLE Sensation: history of peripheral neuropathy LLE Deficits / Details: Poor effort with all U/LE movement, PROM/AAROM grossly WFL, but sore all over; unable to muscle test due to poor effort and difficulty following commands LLE Sensation: history of peripheral neuropathy   Balance Balance Balance Assessed: Yes Static Sitting Balance Static Sitting - Balance Support: Feet supported;Left upper extremity  supported Static Sitting - Level of Assistance: 3: Mod assist;4: Min assist;2: Max assist Static Sitting - Comment/# of Minutes: pt with fluctuating balance, initially falling all over the place esp anteriorly with no balance reactions, after propped on right elbow briefly and after attempts to pivot to chair with very little effort seemed more stable in sitting able to sit with less support   End of Session PT - End of Session Equipment Utilized During Treatment: Gait belt Activity Tolerance: Patient limited by fatigue;Patient limited by pain Patient left: with call bell/phone within reach;in chair Nurse Communication: Need for lift equipment;Mobility status  GP     Joan [name],Joan [name] 05/16/2013, 11:01 AM Sheran Lawless, PT [phone] 05/16/2013

## 2013-05-16 NOTE — Evaluation (Signed)
Clinical/Bedside Swallow Evaluation Patient Details  Name: Joan [name] MRN: [id number] Date of Birth: 20-May-1961  Today's Date: 05/16/2013 Time: 0912-0920 SLP Time Calculation (min): 8 min  Past Medical History:  Past Medical History  Diagnosis Date  . Peripheral neuropathy   . Fibromyalgia   . Hypertension   . Anxiety   . Catatonia    Past Surgical History:  Past Surgical History  Procedure Laterality Date  . Gastric bypass    . Abdominal hysterectomy     HPI:  52 y.o. female with history of fibromyalgia, anxiety, catatonia, gastric bypass who was brought to the ED after having decreased responsiveness for the past 2-3 days. She is unresponsive at this time and her son gives the history. 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 He states that she has not had any food or liquids during this time as well. She was diagnosed with a UTI on 04/05/2013 and was prescribed Bactrim therapy but she did not take the medication due to increased nausea and vomiting.  In the ED, she was evaluated and found to be minimally responsive and febrile. She was found to have a UTI, A CT scan of the head was performed and was found to be negative for acute findings.  CXR No acute  cardiopulmonary disease.   Assessment / Plan / Recommendation Clinical Impression  Pt. demonstrated moderate oral dysphagia with inability to functionally masticate solid texture and transit, therefore expectorated cracker.  Decreased laryngeal elevation exhibited without s/s aspiration with any consistency.  Recommend Dys 1 diet texture and thin liquid, pills with thin liquid, straws ok, check oral cavity for pocketing.  Will follow briefly for tolerance.    Aspiration Risk  Moderate    Diet Recommendation Dysphagia 1 (Puree);Thin liquid   Liquid Administration via: Cup;Straw Medication Administration: Whole meds with liquid Supervision: Patient able to self feed;Full  supervision/cueing for compensatory strategies Compensations: Slow rate;Small sips/bites;Check for pocketing Postural Changes and/or Swallow Maneuvers: Seated upright 90 degrees    Other  Recommendations Oral Care Recommendations: Oral care BID   Follow Up Recommendations   (TDB)    Frequency and Duration min 1 x/week  1 week   Pertinent Vitals/Pain WDL                  Oral/Motor/Sensory Function Overall Oral Motor/Sensory Function: Appears within functional limits for tasks assessed   Ice Chips Ice chips: Not tested   Thin Liquid Thin Liquid: Impaired Presentation: Cup;Straw Oral Phase Impairments: Reduced labial seal Pharyngeal  Phase Impairments: Decreased hyoid-laryngeal movement    Nectar Thick Nectar Thick Liquid: Not tested   Honey Thick Honey Thick Liquid: Not tested   Puree Puree: Impaired Pharyngeal Phase Impairments: Decreased hyoid-laryngeal movement   Solid       Solid: Impaired Oral Phase Impairments: Reduced lingual movement/coordination;Impaired anterior to posterior transit Oral Phase Functional Implications: Oral residue (unable to fully masticate and transit) Pharyngeal Phase Impairments: Decreased hyoid-laryngeal movement       Joan [name] Joan [name] M.Ed [employer] [phone]  05/16/2013

## 2013-05-16 NOTE — Progress Notes (Signed)
Utilization review completed.  

## 2013-05-16 NOTE — Progress Notes (Signed)
[facility] Progress Note [place] TEAM 1 - Stepdown/ICU TEAM   Candida Peeling ZOX:[id number] DOB: 12-25-1960 DOA: 05/11/2013 PCP: No primary provider on file.  Admit HPI / Brief Narrative: 52 y.o. female who was brought to the ED after having decreased responsiveness for 2-3 days. She was unresponsive at presentation and her son gave the history. 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 He stated that she has not had any food or liquids for the last 203 days. She was diagnosed with a UTI on 04/05/2013 and was prescribed Bactrim therapy but she did not take the medication due to increased nausea and vomiting. Her son also reported that prior to this she had been falling down a lot and had sustained multiple bruises from her falls.   In the ED, she was evaluated and found to be minimally responsive and febrile. She was found to have a UTI, and and increased lactic acid level of 5.  She was placed on IV Rocephin and IVFs for rehydration. A CT scan of the head was performed and was found to be negative for acute findings.   HPI/Subjective: Patient awake and continues to complain of paresthesia in feet and right shoulder pain and weakness upon movement   Assessment/Plan:  Toxic metabolic encephalopathy -Initially felt to be 2/2 combination of encephalopathy related to pyelonephritis and possible psychiatric issues/catatonia  - no acute findings on CT scan of the head  - MRI brain was concerning for either hypoxic vs CJD etiology -WJX:BJYNWGNF with moderately severe continuous generalized nonspecific slowing of cerebral activity. This pattern of slowing can be seen with a wide variety of encephalopathic processes, including metabolic, toxic and degenerative encephalopathies. No evidence of an epileptic disorder was demonstrated. - CSF consistent with encephalitis (Tau and 14-3-3 pending specific for CJD) -Cortisol 19.8  -folic acid low,  but not severely so - replacing  -SLP recommends D1 diet with thin liquids/supervision  Acute renal failure -Likely simply prerenal azotemia related to dehydration due to poor intake  - improving with hydration   Hypernatremia - cont 1/2 NS which has been effective   Metabolic acidosis -resolved w/ transition to 1/2 NS IVF   Constipation -radiographic documentation in past- check KUB today -RN concerned may be passing stool intravaginally - will monitor and consider CT Pelvis with contrast to clarify  UTI / pyelonephritis  - UA c/w same  -Continue empiric antibiotic therapy  - cx not successful in identifying offending organism  Hx of catatonia -Son reported two prior episodes but states they have been quite infrequent  - does not appear to be followed chronically by Psychiatry or Neurology   Code Status: FULL Family Communication: Briefly spoke with son at bedside 12/16 but no family at bedside 12/17 Disposition Plan: SDU  Consultants: Neurology  Procedures: none  Antibiotics: Rocephin 12/13 >>  DVT prophylaxis: SCDs   Objective: Blood pressure 139/77, pulse 101, temperature 98.9 F (37.2 C), temperature source Oral, resp. rate 23, height 5\' 5"  (1.651 m), weight 154 lb 8.7 oz (70.1 kg), SpO2 98.00%.  Intake/Output Summary (Last 24 hours) at 05/16/13 1133 Last data filed at 05/16/13 6213  Gross per 24 hour  Intake      0 ml  Output   1100 ml  Net  -1100 ml   Exam: General: No acute respiratory distress Lungs: Clear to auscultation bilaterally without wheezes or crackles Cardiovascular: Regular rate and rhythm without murmur gallop or rub normal S1 and  S2 Abdomen: Nontender, nondistended, soft, bowel sounds positive, no rebound, no ascites, no appreciable mass Genitourinary; Foley in place- mucosal surface intoitus very rec- unable to determine if stool coming from introitus or just rectal contaminant Extremities: No significant cyanosis, clubbing, or edema  bilateral lower extremities-noted reproducible left shoulder pain with PROM Neurological: Awake with profound proximal extremity weakness esp.in arms, speech more clear, seems confused  Data Reviewed: Basic Metabolic Panel:  Recent Labs Lab 05/12/13 0640 05/13/13 0345 05/14/13 0450 05/15/13 0939 05/16/13 0940  NA 148* 152* 147* 140 138  K 3.4* 3.1* 3.5 3.2* 3.8  CL 115* 119* 116* 105 102  CO2 17* 15* 18* 20 21  GLUCOSE 114* 94 64* 73 69*  BUN 45* 31* 17 9 7   CREATININE 1.28* 1.08 0.89 0.72 0.65  CALCIUM 8.1* 8.1* 8.4 8.3* 8.5  MG  --  2.1  --   --   --    Liver Function Tests:  Recent Labs Lab 05/11/13 1755 05/13/13 0345 05/14/13 0450 05/16/13 0940  AST 38* 24 26 34  ALT 29 17 16 19   ALKPHOS 53 36* 36* 41  BILITOT 1.4* 0.6 0.4 0.6  PROT 8.6* 6.1 5.7* 6.1  ALBUMIN 4.0 2.7* 2.6* 2.8*    Recent Labs Lab 05/13/13 0345  AMMONIA 22    CBC:  Recent Labs Lab 05/11/13 1755  05/13/13 0345 05/14/13 0450 05/14/13 1215 05/15/13 0939 05/16/13 0940  WBC 8.8  < > 5.8 5.4 5.5 5.5 5.2  NEUTROABS 6.1  --   --   --   --   --   --   HGB 14.3  < > 10.2* 8.5* 9.0* 9.1* 9.7*  HCT 41.9  < > 30.6* 25.5* 26.9* 27.4* 28.0*  MCV 89.9  < > 90.3 90.7 91.8 90.1 88.1  PLT 53*  < > 38* 40* 43* 42* 51*  < > = values in this interval not displayed. Cardiac Enzymes:  Recent Labs Lab 05/11/13 1755 05/13/13 1755  CKTOTAL  --  79  TROPONINI <0.30  --    CBG:  Recent Labs Lab 05/11/13 1750  GLUCAP 116*    Recent Results (from the past 240 hour(s))  URINE CULTURE     Status: None   Collection Time    05/11/13  7:02 PM      Result Value Range Status   Specimen Description URINE, CATHETERIZED   Final   Special Requests ADDED 0503 05/12/13   Final   Culture  Setup Time     Final   Value: 05/11/2013 05:40     Performed at [facility]   Colony Count     Final   Value: NO GROWTH     Performed at [facility]   Culture     Final   Value: NO GROWTH      Performed at [facility]   Report Status 05/13/2013 FINAL   Final  MRSA PCR SCREENING     Status: None   Collection Time    05/11/13 10:30 PM      Result Value Range Status   MRSA by PCR NEGATIVE  NEGATIVE Final   Comment:            The GeneXpert MRSA Assay (FDA     approved for NASAL specimens     only), is one component of a     comprehensive MRSA colonization     surveillance program. It is not     intended to diagnose MRSA  infection nor to guide or     monitor treatment for     MRSA infections.     Studies:  Recent x-ray studies have been reviewed in detail by the Attending Physician  Scheduled Meds:  Scheduled Meds: . antiseptic oral rinse  15 mL Mouth Rinse q12n4p  . cefTRIAXone (ROCEPHIN)  IV  1 g Intravenous Q24H  . chlorhexidine  15 mL Mouth Rinse BID  . folic acid  1 mg Intravenous Daily  . influenza vac split quadrivalent PF  0.5 mL Intramuscular Tomorrow-1000  . pantoprazole (PROTONIX) IV  40 mg Intravenous Q12H  . thiamine  100 mg Intravenous Daily    Time spent on care of this patient: 35 mins   ELLIS,ALLISON L. ANP  [facility] Office  [phone] Pager - Text Page per Loretha Stapler as per below:  On-Call/Text Page:      [email or web address]      password [email or web address]  If 7PM-7AM, please contact night-coverage [email or web address] Password [facility] 05/16/2013, 11:33 AM   LOS: 5 days     I have examined the patient, reviewed the chart and modified the above note which I agree with.   Joscelyne Renville,MD [phone] 05/16/2013, 3:28 PM

## 2013-05-17 ENCOUNTER — Encounter (HOSPITAL_COMMUNITY): Payer: Self-pay | Admitting: Radiology

## 2013-05-17 ENCOUNTER — Inpatient Hospital Stay (HOSPITAL_COMMUNITY): Payer: Medicare Other

## 2013-05-17 MED ORDER — ATENOLOL 12.5 MG HALF TABLET
12.5000 mg | ORAL_TABLET | Freq: Every day | ORAL | Status: DC
  Administered 2013-05-17 – 2013-05-27 (×10): 12.5 mg via ORAL
  Filled 2013-05-17 (×12): qty 1

## 2013-05-17 MED ORDER — SODIUM CHLORIDE 0.9 % IV BOLUS (SEPSIS)
500.0000 mL | Freq: Once | INTRAVENOUS | Status: AC
  Administered 2013-05-17: 500 mL via INTRAVENOUS

## 2013-05-17 MED ORDER — IOHEXOL 300 MG/ML  SOLN
100.0000 mL | Freq: Once | INTRAMUSCULAR | Status: AC | PRN
  Administered 2013-05-17: 100 mL via INTRAVENOUS

## 2013-05-17 NOTE — Progress Notes (Signed)
SLP Cancellation Note  Patient Details Name: Joan [name] MRN: [id number] DOB: [birth year]   Cancelled treatment:       Reason Eval/Treat Not Completed: Patient at procedure or test/unavailable.  Will follow-up next day as able.   Fae Pippin, M.A., CCC-SLP [phone]  Sherea Liptak 05/17/2013, 3:58 PM

## 2013-05-17 NOTE — Progress Notes (Signed)
Subjective: Patient improved today.  Responses less delayed and more appropriate.  No seizure activity noted.  Complains of numbness on the right.  Objective: Current vital signs: BP 123/74  Pulse 112  Temp(Src) 98.8 F (37.1 C) (Oral)  Resp 15  Ht 5\' 5"  (1.651 m)  Wt 70.1 kg (154 lb 8.7 oz)  BMI 25.72 kg/m2  SpO2 99% Vital signs in last 24 hours: Temp:  [98 F (36.7 C)-98.8 F (37.1 C)] 98.8 F (37.1 C) (12/19 0734) Pulse Rate:  [99-129] 112 (12/19 0328) Resp:  [15-26] 15 (12/19 0328) BP: (93-128)/(50-87) 123/74 mmHg (12/19 0328) SpO2:  [98 %-100 %] 99 % (12/19 0328)  Intake/Output from previous day: 12/18 0701 - 12/19 0700 In: 1325 [I.V.:825; IV Piggyback:500] Out: 1100 [Urine:1100] Intake/Output this shift: Total I/O In: 120 [P.O.:120] Out: 400 [Urine:400] Nutritional status: Dysphagia  Neurologic Exam: Mental Status:  Alert. Speech fluent. No delays in responses. Able to follow commands without difficulty but has difficulty appreciating the right.  Cranial Nerves:  II: Discs flat bilaterally; Visual fields grossly normal, pupils equal, round, reactive to light and accommodation  [name],IV, VI: ptosis not present, extra-ocular motions intact bilaterally  V,VII: smile symmetric, facial light touch sensation normal bilaterally  VIII: hearing normal bilaterally  IX,X: gag reflex present  XI: bilateral shoulder shrug  XII: midline tongue extension  Motor:  Moves all extremities against gravity.  On the right must be showed the extremity and the arm lifted for her.  When the arm is lifted she is able to maintain the arm but has difficulty appreciating it in space.  Moves both lower extremities but again has difficulty with the right.  Sensory: Decreased sensation on the right Deep Tendon Reflexes: 1+ and symmetric throughout  Plantars:  Right: downgoing    Left: downgoing   Lab Results: Basic Metabolic Panel:  Recent Labs Lab 05/12/13 0640 05/13/13 0345  05/14/13 0450 05/15/13 0939 05/16/13 0940  NA 148* 152* 147* 140 138  K 3.4* 3.1* 3.5 3.2* 3.8  CL 115* 119* 116* 105 102  CO2 17* 15* 18* 20 21  GLUCOSE 114* 94 64* 73 69*  BUN 45* 31* 17 9 7   CREATININE 1.28* 1.08 0.89 0.72 0.65  CALCIUM 8.1* 8.1* 8.4 8.3* 8.5  MG  --  2.1  --   --   --     Liver Function Tests:  Recent Labs Lab 05/11/13 1755 05/13/13 0345 05/14/13 0450 05/16/13 0940  AST 38* 24 26 34  ALT 29 17 16 19   ALKPHOS 53 36* 36* 41  BILITOT 1.4* 0.6 0.4 0.6  PROT 8.6* 6.1 5.7* 6.1  ALBUMIN 4.0 2.7* 2.6* 2.8*   No results found for this basename: LIPASE, AMYLASE,  in the last 168 hours  Recent Labs Lab 05/13/13 0345  AMMONIA 22    CBC:  Recent Labs Lab 05/11/13 1755  05/13/13 0345 05/14/13 0450 05/14/13 1215 05/15/13 0939 05/16/13 0940  WBC 8.8  < > 5.8 5.4 5.5 5.5 5.2  NEUTROABS 6.1  --   --   --   --   --   --   HGB 14.3  < > 10.2* 8.5* 9.0* 9.1* 9.7*  HCT 41.9  < > 30.6* 25.5* 26.9* 27.4* 28.0*  MCV 89.9  < > 90.3 90.7 91.8 90.1 88.1  PLT 53*  < > 38* 40* 43* 42* 51*  < > = values in this interval not displayed.  Cardiac Enzymes:  Recent Labs Lab 05/11/13 1755 05/13/13 1755  CKTOTAL  --  79  TROPONINI <0.30  --     Lipid Panel: No results found for this basename: CHOL, TRIG, HDL, CHOLHDL, VLDL, LDLCALC,  in the last 168 hours  CBG:  Recent Labs Lab 05/11/13 1750  GLUCAP 116*    Microbiology: Results for orders placed during the hospital encounter of 05/11/13  URINE CULTURE     Status: None   Collection Time    05/11/13  7:02 PM      Result Value Range Status   Specimen Description URINE, CATHETERIZED   Final   Special Requests ADDED 0503 05/12/13   Final   Culture  Setup Time     Final   Value: 05/11/2013 05:40     Performed at [facility]   Colony Count     Final   Value: NO GROWTH     Performed at [facility]   Culture     Final   Value: NO GROWTH     Performed at [facility]    Report Status 05/13/2013 FINAL   Final  MRSA PCR SCREENING     Status: None   Collection Time    05/11/13 10:30 PM      Result Value Range Status   MRSA by PCR NEGATIVE  NEGATIVE Final   Comment:            The GeneXpert MRSA Assay (FDA     approved for NASAL specimens     only), is one component of a     comprehensive MRSA colonization     surveillance program. It is not     intended to diagnose MRSA     infection nor to guide or     monitor treatment for     MRSA infections.    Coagulation Studies:  Recent Labs  05/14/13 1215  LABPROT 16.9*  INR 1.41    Imaging: Mr Brain Wo Contrast  05/16/2013   CLINICAL DATA:  Altered mental status.  EXAM: MRI HEAD WITHOUT CONTRAST  TECHNIQUE: Multiplanar, multiecho pulse sequences of the brain and surrounding structures were obtained without intravenous contrast.  COMPARISON:  05/13/2013.  FINDINGS: The patient was unable to remain motionless for the exam. Small or subtle lesions could be overlooked.  There is interval improvement in the previously identified symmetric bilateral medial and dorsal thalamic restricted diffusion with T2 and FLAIR hyperintensity. Very slight and symmetric restricted diffusion is seen medially in the areas affected previously. There is also near normalization of the T2 and FLAIR hyperintensity. No new areas of restricted diffusion or brain edema. No other new findings. Partial empty sella re- demonstrated.  IMPRESSION: Interval improvement in the previously identified symmetric areas of thalamic (gray matter) restricted diffusion and T2 signal prolongation. Favor improving metabolic process (hypernatremia? Renal insufficiency?), versus normalization of postictal phenomenon.  No new abnormalities are seen.   Electronically Signed   By: Davonna Belling M.D.   On: 05/16/2013 18:42   Dg Abd Portable 1v  05/16/2013   CLINICAL DATA:  Obstipation  EXAM: PORTABLE ABDOMEN - 1 VIEW  COMPARISON:  06/01/2011  FINDINGS: Normal  bowel gas pattern.  No bowel dilatation or bowel wall thickening.  Bones appear slightly demineralized.  No urinary tract calcification.  IMPRESSION: Normal exam.   Electronically Signed   By: Ulyses Southward M.D.   On: 05/16/2013 22:23    Medications:  I have reviewed the patient's current medications. Scheduled: . antiseptic oral rinse  15 mL Mouth Rinse  q12n4p  . atenolol  12.5 mg Oral Daily  . cefTRIAXone (ROCEPHIN)  IV  1 g Intravenous Q24H  . chlorhexidine  15 mL Mouth Rinse BID  . folic acid  1 mg Intravenous Daily  . influenza vac split quadrivalent PF  0.5 mL Intramuscular Tomorrow-1000  . pantoprazole (PROTONIX) IV  40 mg Intravenous Q12H  . thiamine  100 mg Intravenous Daily    Assessment/Plan: Patient improved.  MRI of the brain reviewed and improved as well showing less diffusion abnormality.  Based on evolution abnormalities were most likely related to seizure.  Patient has had no further seizure activity.    Recommendations: 1.  Will continue to follow up LP results 2.  Continue therapy.    LOS: 6 days   Thana Farr, MD [facility] [phone] 05/17/2013  10:40 AM

## 2013-05-17 NOTE — Progress Notes (Signed)
[facility] Progress Note [place] TEAM 1 - Stepdown/ICU TEAM   Candida Peeling ZOX:[id number] DOB: Dec 11, 1960 DOA: 05/11/2013 PCP: No primary provider on file.  Admit HPI / Brief Narrative: 52 y.o. female who was brought to the ED after having decreased responsiveness for 2-3 days. She was unresponsive at presentation and her son gave the history. 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 He stated that she has not had any food or liquids for the last 203 days. She was diagnosed with a UTI on 04/05/2013 and was prescribed Bactrim therapy but she did not take the medication due to increased nausea and vomiting. Her son also reported that prior to this she had been falling down a lot and had sustained multiple bruises from her falls.   In the ED, she was evaluated and found to be minimally responsive and febrile. She was found to have a UTI, and and increased lactic acid level of 5.  She was placed on IV Rocephin and IVFs for rehydration. A CT scan of the head was performed and was found to be negative for acute findings.   HPI/Subjective: Patient more alert and interactive- still with right shoulder pain and feet paresthesias  Assessment/Plan:  Toxic metabolic encephalopathy -Initially felt to be 2/2 combination of encephalopathy related to pyelonephritis and possible psychiatric issues/catatonia  - no acute findings on CT scan of the head  - MRI brain was concerning for either hypoxic vs CJD etiology-follow up MRI 12/18 showed improvement and Neuro feels changes were due to seizures. -WJX:BJYNWGNF with moderately severe continuous generalized nonspecific slowing of cerebral activity. This pattern of slowing can be seen with a wide variety of encephalopathic processes, including metabolic, toxic and degenerative encephalopathies. No evidence of an epileptic disorder was demonstrated. - CSF consistent with encephalitis (Tau and 14-3-3  pending specific for CJD) -Cortisol 19.8  -folic acid low, but not severely so - replacing  -SLP recommended D1 diet with thin liquids/supervision  Acute renal failure -Likely simply prerenal azotemia related to dehydration due to poor intake  - improving with hydration   Hypernatremia - cont 1/2 NS which has been effective   Tachycardia -resume beta blocker  Metabolic acidosis -resolved w/ transition to 1/2 NS IVF   Constipation -radiographic documentation in past- check KUB today -RN concerned may be passing stool intravaginally - will monitor and consider CT Pelvis with contrast to clarify  UTI / pyelonephritis  - UA c/w same  -Continue empiric antibiotic therapy  - cx not successful in identifying offending organism   ?? Rectovaginal fistula -based on clinical exam -check CT pelvis with IV and rectal contrast  Hx of catatonia -Son reported two prior episodes but states they have been quite infrequent  - does not appear to be followed chronically by Psychiatry or Neurology   Code Status: FULL Family Communication: Briefly spoke with son at bedside 12/16 but no family at bedside 12/17, 12/18 or 12/19 Disposition Plan: Transfer to Telemetry  Consultants: Neurology  Procedures: none  Antibiotics: Rocephin 12/13 >>  DVT prophylaxis: SCDs   Objective: Blood pressure 117/74, pulse 115, temperature 98.8 F (37.1 C), temperature source Oral, resp. rate 17, height 5\' 5"  (1.651 m), weight 154 lb 8.7 oz (70.1 kg), SpO2 100.00%.  Intake/Output Summary (Last 24 hours) at 05/17/13 1424 Last data filed at 05/17/13 1300  Gross per 24 hour  Intake 1969.17 ml  Output    750 ml  Net 1219.17 ml  Exam: General: No acute respiratory distress Lungs: Clear to auscultation bilaterally without wheezes or crackles Cardiovascular: Regular rate and rhythm without murmur gallop or rub normal S1 and S2 Abdomen: Nontender, nondistended, soft, bowel sounds positive, no rebound,  no ascites, no appreciable mass Genitourinary; Foley in place- mucosal surface intoitus very rec- unable to determine if stool coming from introitus or just rectal contaminant but apparently overnight RN's were able to confirm Extremities: No significant cyanosis, clubbing, or edema bilateral lower extremities-noted reproducible RIGHT shoulder pain with AROM Neurological: Awake with improving proximal extremity weakness esp.in arms, speech more clear, seems confused; still confused and responses to questions are inappropriate at times  Data Reviewed: Basic Metabolic Panel:  Recent Labs Lab 05/12/13 0640 05/13/13 0345 05/14/13 0450 05/15/13 0939 05/16/13 0940  NA 148* 152* 147* 140 138  K 3.4* 3.1* 3.5 3.2* 3.8  CL 115* 119* 116* 105 102  CO2 17* 15* 18* 20 21  GLUCOSE 114* 94 64* 73 69*  BUN 45* 31* 17 9 7   CREATININE 1.28* 1.08 0.89 0.72 0.65  CALCIUM 8.1* 8.1* 8.4 8.3* 8.5  MG  --  2.1  --   --   --    Liver Function Tests:  Recent Labs Lab 05/11/13 1755 05/13/13 0345 05/14/13 0450 05/16/13 0940  AST 38* 24 26 34  ALT 29 17 16 19   ALKPHOS 53 36* 36* 41  BILITOT 1.4* 0.6 0.4 0.6  PROT 8.6* 6.1 5.7* 6.1  ALBUMIN 4.0 2.7* 2.6* 2.8*    Recent Labs Lab 05/13/13 0345  AMMONIA 22    CBC:  Recent Labs Lab 05/11/13 1755  05/13/13 0345 05/14/13 0450 05/14/13 1215 05/15/13 0939 05/16/13 0940  WBC 8.8  < > 5.8 5.4 5.5 5.5 5.2  NEUTROABS 6.1  --   --   --   --   --   --   HGB 14.3  < > 10.2* 8.5* 9.0* 9.1* 9.7*  HCT 41.9  < > 30.6* 25.5* 26.9* 27.4* 28.0*  MCV 89.9  < > 90.3 90.7 91.8 90.1 88.1  PLT 53*  < > 38* 40* 43* 42* 51*  < > = values in this interval not displayed. Cardiac Enzymes:  Recent Labs Lab 05/11/13 1755 05/13/13 1755  CKTOTAL  --  79  TROPONINI <0.30  --    CBG:  Recent Labs Lab 05/11/13 1750  GLUCAP 116*    Recent Results (from the past 240 hour(s))  URINE CULTURE     Status: None   Collection Time    05/11/13  7:02 PM       Result Value Range Status   Specimen Description URINE, CATHETERIZED   Final   Special Requests ADDED 0503 05/12/13   Final   Culture  Setup Time     Final   Value: 05/11/2013 05:40     Performed at [facility]   Colony Count     Final   Value: NO GROWTH     Performed at [facility]   Culture     Final   Value: NO GROWTH     Performed at [facility]   Report Status 05/13/2013 FINAL   Final  MRSA PCR SCREENING     Status: None   Collection Time    05/11/13 10:30 PM      Result Value Range Status   MRSA by PCR NEGATIVE  NEGATIVE Final   Comment:            The  GeneXpert MRSA Assay (FDA     approved for NASAL specimens     only), is one component of a     comprehensive MRSA colonization     surveillance program. It is not     intended to diagnose MRSA     infection nor to guide or     monitor treatment for     MRSA infections.     Studies:  Recent x-ray studies have been reviewed in detail by the Attending Physician  Scheduled Meds:  Scheduled Meds: . antiseptic oral rinse  15 mL Mouth Rinse q12n4p  . atenolol  12.5 mg Oral Daily  . cefTRIAXone (ROCEPHIN)  IV  1 g Intravenous Q24H  . chlorhexidine  15 mL Mouth Rinse BID  . folic acid  1 mg Intravenous Daily  . influenza vac split quadrivalent PF  0.5 mL Intramuscular Tomorrow-1000  . pantoprazole (PROTONIX) IV  40 mg Intravenous Q12H  . thiamine  100 mg Intravenous Daily    Time spent on care of this patient: 35 mins   ELLIS,ALLISON L. ANP  [facility] Office  [phone] Pager - Text Page per Loretha Stapler as per below:  On-Call/Text Page:      [email or web address]      password [email or web address]  If 7PM-7AM, please contact night-coverage [email or web address] Password [email or web address] 05/17/2013, 2:24 PM   LOS: 6 days     I have examined the patient, reviewed the chart and modified the above note which I agree with.   Quana Chamberlain,MD [phone] 05/17/2013, 6:03 PM

## 2013-05-17 NOTE — Progress Notes (Signed)
Patient resting in bed,a-febrile, HR maintaining in the 120s, BP 123/74, UOP 100cc in the last 4 hours. Maren Reamer notified. New orders received. Will continue to monitor.   Rochele Pages, RN

## 2013-05-17 NOTE — Progress Notes (Addendum)
Physical Therapy Treatment Patient Details Name: Joan [name] MRN: [id number] DOB: [birth year] Today's Date: 05/17/2013 Time: 6213-0865 PT Time Calculation (min): 40 min  PT Assessment / Plan / Recommendation  History of Present Illness Joan [name] is a 52 y.o. female who was brought to the ED after having decreased responsiveness for the past 2-3 days.  She is unresponsive at this time and her son gives the history.  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  He states that she has not had any food or liquids during this time as well.   She was diagnosed with a UTI on 04/05/2013 and was prescribed Bactrim therapy but she did not take the medication due to increased nausea and vomiting.   Her son also reports that prior to this she had been falling down a lot and has multiple bruises from her falls.    PT Comments   Patient able to demonstrate balance reactions though delayed.  She continues to exhibit limited effort.  Able to get her on her feet with significant assist and seemed painful with weight on her feet.  Will need extensive SNF level rehab.  Feel Psych input could improve participation.  Follow Up Recommendations  SNF     Does the patient have the potential to tolerate intense rehabilitation   No  Barriers to Discharge  Limited caregiver support      Equipment Recommendations  Other (comment) (TBA)    Recommendations for Other Services  Psych consult  Frequency Min 3X/week   Progress towards PT Goals Progress towards PT goals: Progressing toward goals  Plan Current plan remains appropriate    Precautions / Restrictions Precautions Precautions: Fall   Pertinent Vitals/Pain C/o pain with touching feet; HR 149 max with activity 120's at rest    Mobility  Bed Mobility Bed Mobility: Sit to Supine Supine to Sit: 1: +2 Total assist;HOB elevated Supine to Sit: Patient Percentage: 20% Sit to Supine: 1: +2 Total assist Sit to  Supine: Patient Percentage: 0% Details for Bed Mobility Assistance: assisted legs off bed and trunk upright; pt limp and at risk to slide off edge of bed Transfers Transfers: Sit to Stand;Stand to Sit Sit to Stand: From bed;1: +2 Total assist Sit to Stand: Patient Percentage: 10% Stand to Sit: To bed;1: +2 Total assist Stand to Sit: Patient Percentage: 10% Details for Transfer Assistance: performed "3 amigo" technique to stand and pt needed assist at knees and hips for upright posture and max cues for effort    Exercises General Exercises - Lower Extremity Ankle Circles/Pumps: AROM;Both;10 reps;Supine Heel Slides: AAROM;Both;5 reps;Supine    PT Goals (current goals can now be found in the care plan section)    Visit Information  Last PT Received On: 05/17/13 Assistance Needed: +2 History of Present Illness: Joan [name] is a 52 y.o. female who was brought to the ED after having decreased responsiveness for the past 2-3 days.  She is unresponsive at this time and her son gives the history.  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  He states that she has not had any food or liquids during this time as well.   She was diagnosed with a UTI on 04/05/2013 and was prescribed Bactrim therapy but she did not take the medication due to increased nausea and vomiting.   Her son also reports that prior to this she had been falling  down a lot and has multiple bruises from her falls.     Subjective Data      Cognition  Cognition Arousal/Alertness: Awake/alert Overall Cognitive Status: Impaired/Different from baseline Orientation Level: Time;Situation Current Attention Level: Focused Following Commands: Follows one step commands inconsistently;Follows one step commands with increased time Problem Solving: Slow processing;Decreased initiation;Requires verbal cues;Requires tactile cues    Balance  Static Sitting Balance Static Sitting - Balance  Support: Feet unsupported;Bilateral upper extremity supported Static Sitting - Level of Assistance: 4: Min assist;2: Max assist Static Sitting - Comment/# of Minutes: sat edge of bed about 10 minutes with attempts to engage in ADL with brushing hair, washing face, with minimal effort with hand over hand activity  End of Session PT - End of Session Equipment Utilized During Treatment: Gait belt Activity Tolerance: Patient limited by fatigue;Treatment limited secondary to medical complications (Comment) (HR up to 149) Patient left: in bed Nurse Communication: Mobility status   GP     [facility] 05/17/2013, 3:16 PM Sheran Lawless, PT [phone] 05/17/2013

## 2013-05-18 LAB — BASIC METABOLIC PANEL
CO2: 21 mEq/L (ref 19–32)
Calcium: 7.9 mg/dL — ABNORMAL LOW (ref 8.4–10.5)
Creatinine, Ser: 0.65 mg/dL (ref 0.50–1.10)
GFR calc non Af Amer: >90 mL/min (ref >90)
Potassium: 3 mEq/L — ABNORMAL LOW (ref 3.5–5.1)
Sodium: 137 mEq/L (ref 135–145)

## 2013-05-18 LAB — CBC
MCH: 30.2 pg (ref 26.0–34.0)
MCHC: 34.1 g/dL (ref 30.0–36.0)
Platelets: 82 10*3/uL — ABNORMAL LOW (ref 150–400)

## 2013-05-18 LAB — CLOSTRIDIUM DIFFICILE BY PCR: Toxigenic C. Difficile by PCR: POSITIVE — AB

## 2013-05-18 MED ORDER — METRONIDAZOLE 500 MG PO TABS
500.0000 mg | ORAL_TABLET | Freq: Three times a day (TID) | ORAL | Status: DC
  Administered 2013-05-18 – 2013-05-27 (×28): 500 mg via ORAL
  Filled 2013-05-18 (×30): qty 1

## 2013-05-18 MED ORDER — POTASSIUM CHLORIDE 20 MEQ/15ML (10%) PO LIQD
40.0000 meq | Freq: Once | ORAL | Status: AC
  Administered 2013-05-18: 40 meq via ORAL
  Filled 2013-05-18: qty 30

## 2013-05-18 NOTE — Progress Notes (Signed)
[facility] PROGRESS NOTE  Joan [name] RUE:[id number] DOB: 1961/01/24 DOA: 05/11/2013 PCP: No primary provider on file.  Brief narrative: 52 y.o. female who was brought to the ED after having decreased responsiveness for 2-3 days. She was unresponsive at presentation and her son gave the history. 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 He stated that she has not had any food or liquids for the last 203 days. She was diagnosed with a UTI on 04/05/2013 and was prescribed Bactrim therapy but she did not take the medication due to increased nausea and vomiting. Her son also reported that prior to this she had been falling down a lot and had sustained multiple bruises from her falls.  In the ED, she was evaluated and found to be minimally responsive and febrile. She was found to have a UTI, and and increased lactic acid level of 5. She was placed on IV Rocephin and IVFs for rehydration. A CT scan of the head was performed and was found to be negative for acute findings.  Patient admitted to stepdown. She underwent MRI brain and LP. transferred to medical floor for further care.   Assessment/Plan:  Toxic metabolic encephalopathy  -Initially felt to be  combination sepsis from  pyelonephritis and possible psychiatric issues/catatonia  - no acute findings on CT scan of the head  - MRI brain was concerning for either hypoxic vs CJD etiology. follow up MRI 12/18 showed improvement and Neuro feels changes were due to seizures.  -EEG reported abnormal with moderately severe continuous generalized nonspecific slowing of cerebral activity.  No evidence of an epileptic disorder was demonstrated.  - CSF consistent with encephalitis (Tau and 14-3-3 pending --specific for CJD)  -Cortisol 19.8   -SLP recommended D1 diet with thin liquids/supervision  -still  confused on exam today. However per neurology assessment she appears unchanged from yesterday and  her mental status has actually improved significantly since admission.   Acute renal failure  -Likely prerenal with poor po intake - improving with hydration   Hypernatremia  - improved with 1/2 NS  Tachycardia  -Stable after BB resumed.  Constipation followed by diarrhea  has several loose BMs today. Stool foe cdiff sent . CT abd with concern for rectovaginal fistula was negative. - UTI / pyelonephritis  -culture negative. Covering with rocephin empirically since 12/13. Will d/c today   Hx of catatonia  -Son reported two prior episodes but states they have been quite infrequent  - does not appear to be followed chronically by Psychiatry or Neurology   Code Status: FULL   Family Communication: will call son for update  Disposition Plan: pending improvement  Consultants:  Neurology   Procedures LP  Antibiotics:  Rocephin 12/13 >> 12/20  DVT prophylaxis:  SCDs   Code Status:full Family Communication:will update son Disposition: pending improvment    HPI/Subjective: No overnight issues. Pt having frequent loose stools this am  Objective: Filed Vitals:   05/18/13 0522  BP: 112/78  Pulse: 106  Temp: 98.3 F (36.8 C)  Resp: 18    Intake/Output Summary (Last 24 hours) at 05/18/13 1045 Last data filed at 05/17/13 1400  Gross per 24 hour  Intake    560 ml  Output    400 ml  Net    160 ml   Filed Weights   05/11/13 2205 05/17/13 1520  Weight: 70.1 kg (154 lb 8.7 oz) 70.2 kg (154 lb 12.2 oz)    Exam:  General:  Middle aged female in NAD   HEENT: no pallor, moist mucosa   chest: clear b/l, no added sounds  CVS: NS1&S2, no murmurs, rubs or gallop  Abd: soft, NT, ND, BS+'  Ext: warm, no edema  CNS: AAOX0, weakness of extremities more pronounced over RUE.    Data Reviewed: Basic Metabolic Panel:  Recent Labs Lab 05/12/13 0640 05/13/13 0345 05/14/13 0450 05/15/13 0939 05/16/13 0940 05/18/13 0520  NA 148* 152* 147* 140 138 137  K  3.4* 3.1* 3.5 3.2* 3.8 3.0*  CL 115* 119* 116* 105 102 103  CO2 17* 15* 18* 20 21 21   GLUCOSE 114* 94 64* 73 69* 76  BUN 45* 31* 17 9 7  5*  CREATININE 1.28* 1.08 0.89 0.72 0.65 0.65  CALCIUM 8.1* 8.1* 8.4 8.3* 8.5 7.9*  MG  --  2.1  --   --   --   --    Liver Function Tests:  Recent Labs Lab 05/11/13 1755 05/13/13 0345 05/14/13 0450 05/16/13 0940  AST 38* 24 26 34  ALT 29 17 16 19   ALKPHOS 53 36* 36* 41  BILITOT 1.4* 0.6 0.4 0.6  PROT 8.6* 6.1 5.7* 6.1  ALBUMIN 4.0 2.7* 2.6* 2.8*   No results found for this basename: LIPASE, AMYLASE,  in the last 168 hours  Recent Labs Lab 05/13/13 0345  AMMONIA 22   CBC:  Recent Labs Lab 05/11/13 1755  05/14/13 0450 05/14/13 1215 05/15/13 0939 05/16/13 0940 05/18/13 0520  WBC 8.8  < > 5.4 5.5 5.5 5.2 5.3  NEUTROABS 6.1  --   --   --   --   --   --   HGB 14.3  < > 8.5* 9.0* 9.1* 9.7* 8.9*  HCT 41.9  < > 25.5* 26.9* 27.4* 28.0* 26.1*  MCV 89.9  < > 90.7 91.8 90.1 88.1 88.5  PLT 53*  < > 40* 43* 42* 51* 82*  < > = values in this interval not displayed. Cardiac Enzymes:  Recent Labs Lab 05/11/13 1755 05/13/13 1755  CKTOTAL  --  79  TROPONINI <0.30  --    BNP (last 3 results) No results found for this basename: PROBNP,  in the last 8760 hours CBG:  Recent Labs Lab 05/11/13 1750  GLUCAP 116*    Recent Results (from the past 240 hour(s))  URINE CULTURE     Status: None   Collection Time    05/11/13  7:02 PM      Result Value Range Status   Specimen Description URINE, CATHETERIZED   Final   Special Requests ADDED 0503 05/12/13   Final   Culture  Setup Time     Final   Value: 05/11/2013 05:40     Performed at [facility]   Colony Count     Final   Value: NO GROWTH     Performed at [facility]   Culture     Final   Value: NO GROWTH     Performed at [facility]   Report Status 05/13/2013 FINAL   Final  MRSA PCR SCREENING     Status: None   Collection Time    05/11/13 10:30 PM       Result Value Range Status   MRSA by PCR NEGATIVE  NEGATIVE Final   Comment:            The GeneXpert MRSA Assay (FDA     approved for NASAL specimens  only), is one component of a     comprehensive MRSA colonization     surveillance program. It is not     intended to diagnose MRSA     infection nor to guide or     monitor treatment for     MRSA infections.     Studies: Ct Pelvis W Contrast  05/17/2013   CLINICAL DATA:  Rectovaginal fistula.  EXAM: CT PELVIS WITH CONTRAST  TECHNIQUE: Multidetector CT imaging of the pelvis was performed using the standard protocol following the bolus administration of intravenous contrast.  CONTRAST:  OMNIPAQUE IOHEXOL 300 MG/ML  SOLN  COMPARISON:  None.  FINDINGS: Rectal contrast was administered. No contrast extravasation into the vagina to suggest a rectovaginal fistula. There is 1 small dot of air in the vagina. No contrast enters the bladder from the rectum. No pelvic mass, significant free pelvic fluid collection an or lymphadenopathy. The bony structures are unremarkable.  IMPRESSION: No CT findings to suggest a rectovaginal fistula.   Electronically Signed   By: Loralie Champagne M.D.   On: 05/17/2013 16:42   Mr Brain Wo Contrast  05/16/2013   CLINICAL DATA:  Altered mental status.  EXAM: MRI HEAD WITHOUT CONTRAST  TECHNIQUE: Multiplanar, multiecho pulse sequences of the brain and surrounding structures were obtained without intravenous contrast.  COMPARISON:  05/13/2013.  FINDINGS: The patient was unable to remain motionless for the exam. Small or subtle lesions could be overlooked.  There is interval improvement in the previously identified symmetric bilateral medial and dorsal thalamic restricted diffusion with T2 and FLAIR hyperintensity. Very slight and symmetric restricted diffusion is seen medially in the areas affected previously. There is also near normalization of the T2 and FLAIR hyperintensity. No new areas of restricted diffusion or  brain edema. No other new findings. Partial empty sella re- demonstrated.  IMPRESSION: Interval improvement in the previously identified symmetric areas of thalamic (gray matter) restricted diffusion and T2 signal prolongation. Favor improving metabolic process (hypernatremia? Renal insufficiency?), versus normalization of postictal phenomenon.  No new abnormalities are seen.   Electronically Signed   By: Davonna Belling M.D.   On: 05/16/2013 18:42   Dg Abd Portable 1v  05/16/2013   CLINICAL DATA:  Obstipation  EXAM: PORTABLE ABDOMEN - 1 VIEW  COMPARISON:  06/01/2011  FINDINGS: Normal bowel gas pattern.  No bowel dilatation or bowel wall thickening.  Bones appear slightly demineralized.  No urinary tract calcification.  IMPRESSION: Normal exam.   Electronically Signed   By: Ulyses Southward M.D.   On: 05/16/2013 22:23    Scheduled Meds: . antiseptic oral rinse  15 mL Mouth Rinse q12n4p  . atenolol  12.5 mg Oral Daily  . cefTRIAXone (ROCEPHIN)  IV  1 g Intravenous Q24H  . chlorhexidine  15 mL Mouth Rinse BID  . folic acid  1 mg Intravenous Daily  . influenza vac split quadrivalent PF  0.5 mL Intramuscular Tomorrow-1000  . pantoprazole (PROTONIX) IV  40 mg Intravenous Q12H  . thiamine  100 mg Intravenous Daily   Continuous Infusions: . sodium chloride 125 mL/hr at 05/18/13 1019     Time spent: 25 minutes    Charisma Charlot  [facility] Pager [phone]. If 7PM-7AM, please contact night-coverage at [email or web address], password [email or web address] 05/18/2013, 10:45 AM  LOS: 7 days

## 2013-05-18 NOTE — Progress Notes (Signed)
Clinical Social Worker (CSW) contacted patient's son Eusebio Me [phone] to get his choice of skilled nursing facility for the patient to complete short term rehab. Son chooses bed a [facility]. CSW left message with admissions coordinator Jasmine December at [facility] informing her that patient and son chose the facility and to submit for insurance authorization.   Jetta Lout, LCSWA Weekend CSW [phone]

## 2013-05-18 NOTE — Progress Notes (Signed)
Received result from lab of POSITIVE C.DIFF result. MD made aware. Enteric contact precautions ordered. This was explained to patient however I am not sure she is understanding at this point. Will cont to monitor

## 2013-05-18 NOTE — Progress Notes (Signed)
Patient refused all dinner except 1 sip of tea and 1 bite of mashed potatos

## 2013-05-18 NOTE — Progress Notes (Signed)
Speech Language Pathology Treatment: Dysphagia;Cognitive-Linquistic  Patient Details Name: Joan [name] MRN: [id number] DOB: 04/15/1961 Today's Date: 05/18/2013 Time: 1520-1540 SLP Time Calculation (min): 20 min  Assessment / Plan / Recommendation Clinical Impression  Pt reclined in bed, awake.  Pt was observed with thin liquid via straw. No overt s/s aspiration observed.  Pt provided with strategies for improving intelligibility. Pt able to repeat them without delay, and required only min assist to verbally recall them after 5 minutes.  Pt was, however, unable to maintain carryover of suggestions without max cues.  She could use the strategies effectively for 1-2 sentences, then returned to normal low volume, fast rate, minimal mouth movement.  Pt had difficulty attending to task re: biographical information. Pt required cues to provide correct birth year (accurate for month and date).  Difficulty verbalizing how many children she has, and their ages.  Pt would verbally move off topic with unintelligible rambling, and required cues to return to topic.     HPI HPI: 52 y.o. female with history of fibromyalgia, anxiety, catatonia, gastric bypass who was brought to the ED after having decreased responsiveness for the past 2-3 days. She is unresponsive at this time and her son gives the history. 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 He states that she has not had any food or liquids during this time as well. She was diagnosed with a UTI on 04/05/2013 and was prescribed Bactrim therapy but she did not take the medication due to increased nausea and vomiting.  In the ED, she was evaluated and found to be minimally responsive and febrile. She was found to have a UTI, A CT scan of the head was performed and was found to be negative for acute findings.  CXR No acute  cardiopulmonary disease.   Pertinent Vitals VSS  SLP Plan  Continue with current plan of care     Recommendations Diet recommendations: Dysphagia 1 (puree);Thin liquid Liquids provided via: Cup;Straw Medication Administration: Whole meds with liquid Supervision: Patient able to self feed;Full supervision/cueing for compensatory strategies Compensations: Slow rate;Small sips/bites;Check for pocketing Postural Changes and/or Swallow Maneuvers: Seated upright 90 degrees              Oral Care Recommendations: Oral care BID Follow up Recommendations: 24 hour supervision/assistance Plan: Continue with current plan of care    GO   Joan [name] [facility], CCC-SLP [phone]  Joan [name] 05/18/2013, 3:43 PM

## 2013-05-18 NOTE — Progress Notes (Signed)
Subjective: Patient unchanged from yesterday cognitively but moving upper extremities more.  Continues to complain of lower extremity pain.    Objective: Current vital signs: BP 112/78  Pulse 106  Temp(Src) 98.3 F (36.8 C) (Oral)  Resp 18  Ht 5\' 1"  (1.549 m)  Wt 70.2 kg (154 lb 12.2 oz)  BMI 29.26 kg/m2  SpO2 100% Vital signs in last 24 hours: Temp:  [97.2 F (36.2 C)-98.3 F (36.8 C)] 98.3 F (36.8 C) (12/20 0522) Pulse Rate:  [103-149] 106 (12/20 0522) Resp:  [18] 18 (12/20 0522) BP: (102-137)/(64-94) 112/78 mmHg (12/20 0522) SpO2:  [98 %-100 %] 100 % (12/20 0522) Weight:  [70.2 kg (154 lb 12.2 oz)] 70.2 kg (154 lb 12.2 oz) (12/19 1520)  Intake/Output from previous day: 12/19 0701 - 12/20 0700 In: 1144.2 [P.O.:180; I.V.:964.2] Out: 800 [Urine:800] Intake/Output this shift:   Nutritional status: Dysphagia  Neurologic Exam: Mental Status:  Alert. Speech fluent. No delays in responses. Able to follow commands.  Cranial Nerves:  II: Discs flat bilaterally; Visual fields grossly normal, pupils equal, round, reactive to light and accommodation  [name],IV, VI: ptosis not present, extra-ocular motions intact bilaterally  V,VII: smile symmetric, facial light touch sensation normal bilaterally  VIII: hearing normal bilaterally  IX,X: gag reflex present  XI: bilateral shoulder shrug  XII: midline tongue extension  Motor:  Moves all extremities against gravity. Moving right spontaneously.   Sensory: Decreased sensation on the right  Deep Tendon Reflexes: 1+ and symmetric throughout  Plantars:  Right: downgoing   Left: downgoing   Lab Results: Basic Metabolic Panel:  Recent Labs Lab 05/12/13 0640 05/13/13 0345 05/14/13 0450 05/15/13 0939 05/16/13 0940 05/18/13 0520  NA 148* 152* 147* 140 138 137  K 3.4* 3.1* 3.5 3.2* 3.8 3.0*  CL 115* 119* 116* 105 102 103  CO2 17* 15* 18* 20 21 21   GLUCOSE 114* 94 64* 73 69* 76  BUN 45* 31* 17 9 7  5*  CREATININE 1.28* 1.08  0.89 0.72 0.65 0.65  CALCIUM 8.1* 8.1* 8.4 8.3* 8.5 7.9*  MG  --  2.1  --   --   --   --     Liver Function Tests:  Recent Labs Lab 05/11/13 1755 05/13/13 0345 05/14/13 0450 05/16/13 0940  AST 38* 24 26 34  ALT 29 17 16 19   ALKPHOS 53 36* 36* 41  BILITOT 1.4* 0.6 0.4 0.6  PROT 8.6* 6.1 5.7* 6.1  ALBUMIN 4.0 2.7* 2.6* 2.8*   No results found for this basename: LIPASE, AMYLASE,  in the last 168 hours  Recent Labs Lab 05/13/13 0345  AMMONIA 22    CBC:  Recent Labs Lab 05/11/13 1755  05/14/13 0450 05/14/13 1215 05/15/13 0939 05/16/13 0940 05/18/13 0520  WBC 8.8  < > 5.4 5.5 5.5 5.2 5.3  NEUTROABS 6.1  --   --   --   --   --   --   HGB 14.3  < > 8.5* 9.0* 9.1* 9.7* 8.9*  HCT 41.9  < > 25.5* 26.9* 27.4* 28.0* 26.1*  MCV 89.9  < > 90.7 91.8 90.1 88.1 88.5  PLT 53*  < > 40* 43* 42* 51* 82*  < > = values in this interval not displayed.  Cardiac Enzymes:  Recent Labs Lab 05/11/13 1755 05/13/13 1755  CKTOTAL  --  79  TROPONINI <0.30  --     Lipid Panel: No results found for this basename: CHOL, TRIG, HDL, CHOLHDL, VLDL, LDLCALC,  in the  last 168 hours  CBG:  Recent Labs Lab 05/11/13 1750  GLUCAP 116*    Microbiology: Results for orders placed during the hospital encounter of 05/11/13  URINE CULTURE     Status: None   Collection Time    05/11/13  7:02 PM      Result Value Range Status   Specimen Description URINE, CATHETERIZED   Final   Special Requests ADDED 0503 05/12/13   Final   Culture  Setup Time     Final   Value: 05/11/2013 05:40     Performed at [facility]   Colony Count     Final   Value: NO GROWTH     Performed at [facility]   Culture     Final   Value: NO GROWTH     Performed at [facility]   Report Status 05/13/2013 FINAL   Final  MRSA PCR SCREENING     Status: None   Collection Time    05/11/13 10:30 PM      Result Value Range Status   MRSA by PCR NEGATIVE  NEGATIVE Final   Comment:             The GeneXpert MRSA Assay (FDA     approved for NASAL specimens     only), is one component of a     comprehensive MRSA colonization     surveillance program. It is not     intended to diagnose MRSA     infection nor to guide or     monitor treatment for     MRSA infections.    Coagulation Studies: No results found for this basename: LABPROT, INR,  in the last 72 hours  Imaging: Ct Pelvis W Contrast  05/17/2013   CLINICAL DATA:  Rectovaginal fistula.  EXAM: CT PELVIS WITH CONTRAST  TECHNIQUE: Multidetector CT imaging of the pelvis was performed using the standard protocol following the bolus administration of intravenous contrast.  CONTRAST:  OMNIPAQUE IOHEXOL 300 MG/ML  SOLN  COMPARISON:  None.  FINDINGS: Rectal contrast was administered. No contrast extravasation into the vagina to suggest a rectovaginal fistula. There is 1 small dot of air in the vagina. No contrast enters the bladder from the rectum. No pelvic mass, significant free pelvic fluid collection an or lymphadenopathy. The bony structures are unremarkable.  IMPRESSION: No CT findings to suggest a rectovaginal fistula.   Electronically Signed   By: Loralie Champagne M.D.   On: 05/17/2013 16:42   Mr Brain Wo Contrast  05/16/2013   CLINICAL DATA:  Altered mental status.  EXAM: MRI HEAD WITHOUT CONTRAST  TECHNIQUE: Multiplanar, multiecho pulse sequences of the brain and surrounding structures were obtained without intravenous contrast.  COMPARISON:  05/13/2013.  FINDINGS: The patient was unable to remain motionless for the exam. Small or subtle lesions could be overlooked.  There is interval improvement in the previously identified symmetric bilateral medial and dorsal thalamic restricted diffusion with T2 and FLAIR hyperintensity. Very slight and symmetric restricted diffusion is seen medially in the areas affected previously. There is also near normalization of the T2 and FLAIR hyperintensity. No new areas of restricted diffusion  or brain edema. No other new findings. Partial empty sella re- demonstrated.  IMPRESSION: Interval improvement in the previously identified symmetric areas of thalamic (gray matter) restricted diffusion and T2 signal prolongation. Favor improving metabolic process (hypernatremia? Renal insufficiency?), versus normalization of postictal phenomenon.  No new abnormalities are seen.   Electronically Signed  By: Davonna Belling M.D.   On: 05/16/2013 18:42   Dg Abd Portable 1v  05/16/2013   CLINICAL DATA:  Obstipation  EXAM: PORTABLE ABDOMEN - 1 VIEW  COMPARISON:  06/01/2011  FINDINGS: Normal bowel gas pattern.  No bowel dilatation or bowel wall thickening.  Bones appear slightly demineralized.  No urinary tract calcification.  IMPRESSION: Normal exam.   Electronically Signed   By: Ulyses Southward M.D.   On: 05/16/2013 22:23    Medications:  I have reviewed the patient's current medications. Scheduled: . antiseptic oral rinse  15 mL Mouth Rinse q12n4p  . atenolol  12.5 mg Oral Daily  . chlorhexidine  15 mL Mouth Rinse BID  . folic acid  1 mg Intravenous Daily  . influenza vac split quadrivalent PF  0.5 mL Intramuscular Tomorrow-1000  . pantoprazole (PROTONIX) IV  40 mg Intravenous Q12H  . thiamine  100 mg Intravenous Daily    Assessment/Plan: Improvement noted.  Will continue to follow with you.    Case discussed with Dr. Gonzella Lex   LOS: 7 days   Thana Farr, MD [facility] [phone] 05/18/2013  1:08 PM

## 2013-05-19 MED ORDER — FOLIC ACID 1 MG PO TABS
1.0000 mg | ORAL_TABLET | Freq: Every day | ORAL | Status: DC
  Administered 2013-05-20 – 2013-05-27 (×8): 1 mg via ORAL
  Filled 2013-05-19 (×8): qty 1

## 2013-05-19 MED ORDER — VITAMIN B-1 100 MG PO TABS
100.0000 mg | ORAL_TABLET | Freq: Every day | ORAL | Status: DC
  Administered 2013-05-20 – 2013-05-27 (×8): 100 mg via ORAL
  Filled 2013-05-19 (×8): qty 1

## 2013-05-19 MED ORDER — PANTOPRAZOLE SODIUM 40 MG PO TBEC
40.0000 mg | DELAYED_RELEASE_TABLET | Freq: Two times a day (BID) | ORAL | Status: DC
  Administered 2013-05-19 – 2013-05-26 (×14): 40 mg via ORAL
  Filled 2013-05-19 (×10): qty 1

## 2013-05-19 NOTE — Progress Notes (Signed)
Assessment approximately at 2000 tonight, asking pt orientation questions and because very confused of her name, took her a very long time to answer. After several minutes of mumbling and re-asking the question, she came to know her first name. When asked to tell me her last name she mumbled more and said something along the lines of "I know it starts with Q, and then h" I then asked if her last name was Rocque and she replied yes. She was not oriented to any other questions. 0000 & 0400 assessment she knew her first name but did not know her last name. Will continue to monitor.

## 2013-05-19 NOTE — Progress Notes (Signed)
[facility] PROGRESS NOTE  Joan [name] ZOX:[id number] DOB: [birth year] DOA: 05/11/2013 PCP: No primary provider on file.  Brief narrative: 52 y.o. female who was brought to the ED after having decreased responsiveness for 2-3 days. She was unresponsive at presentation and her son gave the history. 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 He stated that she has not had any food or liquids for the last 203 days. She was diagnosed with a UTI on 04/05/2013 and was prescribed Bactrim therapy but she did not take the medication due to increased nausea and vomiting. Her son also reported that prior to this she had been falling down a lot and had sustained multiple bruises from her falls.  In the ED, she was evaluated and found to be minimally responsive and febrile. She was found to have a UTI, and and increased lactic acid level of 5. She was placed on IV Rocephin and IVFs for rehydration. A CT scan of the head was performed and was found to be negative for acute findings.  Patient admitted to stepdown. She underwent MRI brain and LP. transferred to medical floor for further care.   Assessment/Plan:  Toxic metabolic encephalopathy  -Initially felt to be  combination sepsis from  pyelonephritis and possible psychiatric issues/catatonia  - no acute findings on CT scan of the head  - MRI brain was concerning for either hypoxic vs CJD etiology. follow up MRI 12/18 showed improvement and Neuro feels changes were due to seizures.  -EEG reported abnormal with moderately severe continuous generalized nonspecific slowing of cerebral activity.  No evidence of an epileptic disorder was demonstrated.  - CSF consistent with encephalitis (Tau and 14-3-3 pending --specific for CJD)  -Cortisol 19.8   -SLP recommended D1 diet with thin liquids/supervision  - her mental status is improving very slowly. She is able to tell her name and respond to simple commands  at this time.   Acute renal failure  -Likely prerenal with poor po intake - improving with hydration   cdiff Started on empiric flagyl.diarrhea  improving   Hypernatremia  - improved with 1/2 NS  Tachycardia  -Stable after BB resumed.    UTI / pyelonephritis  -culture negative. discontinued rocephin ( received for almost 7 days)   Hx of catatonia  -Son reported two prior episodes but states they have been quite infrequent  - does not appear to be followed chronically by Psychiatry or Neurology .  Code Status: FULL   Family Communication: will call son for update  Disposition Plan: pending improvement  Consultants:  Neurology   Procedures LP  Antibiotics:  Rocephin 12/13 >> 12/20 Flagyl (12/20>>)  DVT prophylaxis:  SCDs   Code Status:full Family Communication:will update son Disposition: pending improvment    HPI/Subjective: No overnight issues.  Objective: Filed Vitals:   05/19/13 0545  BP: 138/77  Pulse: 100  Temp: 99.1 F (37.3 C)  Resp: 18    Intake/Output Summary (Last 24 hours) at 05/19/13 1002 Last data filed at 05/18/13 2035  Gross per 24 hour  Intake    218 ml  Output      0 ml  Net    218 ml   Filed Weights   05/11/13 2205 05/17/13 1520  Weight: 70.1 kg (154 lb 8.7 oz) 70.2 kg (154 lb 12.2 oz)    Exam:   General:  Middle aged female in NAD   HEENT: no pallor, moist mucosa   chest: clear  b/l, no added sounds  CVS: NS1&S2, no murmurs, rubs or gallop  Abd: soft, NT, ND, BS+'  Ext: warm, no edema  CNS: AAOX0, follows simple commands, able to tell her name . weakness of extremities more pronounced over right . [name]    Data Reviewed: Basic Metabolic Panel:  Recent Labs Lab 05/13/13 0345 05/14/13 0450 05/15/13 0939 05/16/13 0940 05/18/13 0520  NA 152* 147* 140 138 137  K 3.1* 3.5 3.2* 3.8 3.0*  CL 119* 116* 105 102 103  CO2 15* 18* 20 21 21   GLUCOSE 94 64* 73 69* 76  BUN 31* 17 9 7  5*  CREATININE 1.08 0.89  0.72 0.65 0.65  CALCIUM 8.1* 8.4 8.3* 8.5 7.9*  MG 2.1  --   --   --   --    Liver Function Tests:  Recent Labs Lab 05/13/13 0345 05/14/13 0450 05/16/13 0940  AST 24 26 34  ALT 17 16 19   ALKPHOS 36* 36* 41  BILITOT 0.6 0.4 0.6  PROT 6.1 5.7* 6.1  ALBUMIN 2.7* 2.6* 2.8*   No results found for this basename: LIPASE, AMYLASE,  in the last 168 hours  Recent Labs Lab 05/13/13 0345  AMMONIA 22   CBC:  Recent Labs Lab 05/14/13 0450 05/14/13 1215 05/15/13 0939 05/16/13 0940 05/18/13 0520  WBC 5.4 5.5 5.5 5.2 5.3  HGB 8.5* 9.0* 9.1* 9.7* 8.9*  HCT 25.5* 26.9* 27.4* 28.0* 26.1*  MCV 90.7 91.8 90.1 88.1 88.5  PLT 40* 43* 42* 51* 82*   Cardiac Enzymes:  Recent Labs Lab 05/13/13 1755  CKTOTAL 79   BNP (last 3 results) No results found for this basename: PROBNP,  in the last 8760 hours CBG: No results found for this basename: GLUCAP,  in the last 168 hours  Recent Results (from the past 240 hour(s))  URINE CULTURE     Status: None   Collection Time    05/11/13  7:02 PM      Result Value Range Status   Specimen Description URINE, CATHETERIZED   Final   Special Requests ADDED 0503 05/12/13   Final   Culture  Setup Time     Final   Value: 05/11/2013 05:40     Performed at [facility]   Colony Count     Final   Value: NO GROWTH     Performed at [facility]   Culture     Final   Value: NO GROWTH     Performed at [facility]   Report Status 05/13/2013 FINAL   Final  MRSA PCR SCREENING     Status: None   Collection Time    05/11/13 10:30 PM      Result Value Range Status   MRSA by PCR NEGATIVE  NEGATIVE Final   Comment:            The GeneXpert MRSA Assay (FDA     approved for NASAL specimens     only), is one component of a     comprehensive MRSA colonization     surveillance program. It is not     intended to diagnose MRSA     infection nor to guide or     monitor treatment for     MRSA infections.  CLOSTRIDIUM DIFFICILE BY  PCR     Status: Abnormal   Collection Time    05/18/13 10:26 AM      Result Value Range Status   C difficile by pcr POSITIVE (*)  NEGATIVE Final   Comment: CRITICAL RESULT CALLED TO, READ BACK BY AND VERIFIED WITH:     MINOR A,RN 1600 05/18/13 SCALES H     Studies: Ct Pelvis W Contrast  05/17/2013   CLINICAL DATA:  Rectovaginal fistula.  EXAM: CT PELVIS WITH CONTRAST  TECHNIQUE: Multidetector CT imaging of the pelvis was performed using the standard protocol following the bolus administration of intravenous contrast.  CONTRAST:  OMNIPAQUE IOHEXOL 300 MG/ML  SOLN  COMPARISON:  None.  FINDINGS: Rectal contrast was administered. No contrast extravasation into the vagina to suggest a rectovaginal fistula. There is 1 small dot of air in the vagina. No contrast enters the bladder from the rectum. No pelvic mass, significant free pelvic fluid collection an or lymphadenopathy. The bony structures are unremarkable.  IMPRESSION: No CT findings to suggest a rectovaginal fistula.   Electronically Signed   By: Loralie Champagne M.D.   On: 05/17/2013 16:42    Scheduled Meds: . antiseptic oral rinse  15 mL Mouth Rinse q12n4p  . atenolol  12.5 mg Oral Daily  . chlorhexidine  15 mL Mouth Rinse BID  . folic acid  1 mg Intravenous Daily  . influenza vac split quadrivalent PF  0.5 mL Intramuscular Tomorrow-1000  . metroNIDAZOLE  500 mg Oral Q8H  . pantoprazole (PROTONIX) IV  40 mg Intravenous Q12H  . thiamine  100 mg Intravenous Daily   Continuous Infusions: . sodium chloride 1,000 mL (05/18/13 2236)     Time spent: 25 minutes    Mary Secord  [facility] Pager [phone]. If 7PM-7AM, please contact night-coverage at [email or web address], password [email or web address] 05/19/2013, 10:02 AM  LOS: 8 days

## 2013-05-20 DIAGNOSIS — R569 Unspecified convulsions: Secondary | ICD-10-CM

## 2013-05-20 DIAGNOSIS — A048 Other specified bacterial intestinal infections: Secondary | ICD-10-CM

## 2013-05-20 MED ORDER — LEVETIRACETAM 500 MG PO TABS
500.0000 mg | ORAL_TABLET | Freq: Two times a day (BID) | ORAL | Status: DC
  Administered 2013-05-20: 500 mg via ORAL
  Filled 2013-05-20 (×3): qty 1

## 2013-05-20 MED ORDER — BOOST / RESOURCE BREEZE PO LIQD
1.0000 | Freq: Two times a day (BID) | ORAL | Status: DC
  Administered 2013-05-20 – 2013-05-27 (×10): 1 via ORAL

## 2013-05-20 MED ORDER — LEVETIRACETAM 500 MG/5ML IV SOLN
1000.0000 mg | Freq: Once | INTRAVENOUS | Status: AC
  Administered 2013-05-20: 1000 mg via INTRAVENOUS
  Filled 2013-05-20: qty 10

## 2013-05-20 NOTE — Progress Notes (Signed)
Foley removed around 1600. Pt is incontinent and has voided a moderate amount since foley removal. Perineal care also done before and after foley removal.

## 2013-05-20 NOTE — Progress Notes (Signed)
Upon administration of flagyl 2200 dose, pt took med crushed in applesauce fine, finished applesauce container and suddenly gagged and vomited everything back up. Paged on call for instruction to not give another dose. Pt refused all but a sip of ginger ale, and continues to not try to drink/sip fluids. Laughs when trying to give her fluids and rambles/mutters words. Will continue to monitor.

## 2013-05-20 NOTE — Progress Notes (Signed)
Subjective: Patient continues to be oriented only to first name.  Patient exhibits eyelid fluttering and persistent upward gaze bilaterally.  When asked questions, she responds with unrelated answers such as "but there's no money in the account".  When I return 30 minutes later, the patient's clinical picture has changed; with resolution of eyelid fluttering and upward gaze, and patient able to answer questions.  Objective: Current vital signs: BP 134/99  Pulse 115  Temp(Src) 98.4 F (36.9 C) (Oral)  Resp 18  Ht 5\' 1"  (1.549 m)  Wt 154 lb 12.2 oz (70.2 kg)  BMI 29.26 kg/m2  SpO2 100% Vital signs in last 24 hours: Temp:  [98.4 F (36.9 C)-99.9 F (37.7 C)] 98.4 F (36.9 C) (12/22 0957) Pulse Rate:  [88-115] 115 (12/22 0957) Resp:  [16-20] 18 (12/22 0957) BP: (120-134)/(64-99) 134/99 mmHg (12/22 0957) SpO2:  [100 %] 100 % (12/22 0957)  Intake/Output from previous day: 12/21 0701 - 12/22 0700 In: -  Out: 1925 [Urine:1925] Intake/Output this shift:   Nutritional status: Dysphagia  Neurologic Exam: Mental Status:  Alert. Speech fluent though content inappropriate. No delays in responses. Only able to follow commands after significant prompting Cranial Nerves:  II: pupils equal, round, reactive to light and accommodation. [name],IV, VI: eyelid fluttering noted, upward gaze noted bilaterally V,VII: smile symmetric, facial light touch sensation normal bilaterally though limited by participation VIII: hearing normal bilaterally  IX,X: gag reflex present  XI: bilateral shoulder shrug  XII: midline tongue extension  Motor:  Moves all extremities against gravity. Posturing noted in extremities after movement Sensory: Decreased sensation on the right  Deep Tendon Reflexes: 1+ and symmetric throughout  Plantars:  Right: downgoing   Left: downgoing   Lab Results: Basic Metabolic Panel:  Recent Labs Lab 05/14/13 0450 05/15/13 0939 05/16/13 0940 05/18/13 0520  NA 147* 140 138  137  K 3.5 3.2* 3.8 3.0*  CL 116* 105 102 103  CO2 18* 20 21 21   GLUCOSE 64* 73 69* 76  BUN 17 9 7  5*  CREATININE 0.89 0.72 0.65 0.65  CALCIUM 8.4 8.3* 8.5 7.9*    Liver Function Tests:  Recent Labs Lab 05/14/13 0450 05/16/13 0940  AST 26 34  ALT 16 19  ALKPHOS 36* 41  BILITOT 0.4 0.6  PROT 5.7* 6.1  ALBUMIN 2.6* 2.8*    CBC:  Recent Labs Lab 05/14/13 0450 05/14/13 1215 05/15/13 0939 05/16/13 0940 05/18/13 0520  WBC 5.4 5.5 5.5 5.2 5.3  HGB 8.5* 9.0* 9.1* 9.7* 8.9*  HCT 25.5* 26.9* 27.4* 28.0* 26.1*  MCV 90.7 91.8 90.1 88.1 88.5  PLT 40* 43* 42* 51* 82*    Cardiac Enzymes:  Recent Labs Lab 05/13/13 1755  CKTOTAL 79     Microbiology: Results for orders placed during the hospital encounter of 05/11/13  URINE CULTURE     Status: None   Collection Time    05/11/13  7:02 PM      Result Value Range Status   Specimen Description URINE, CATHETERIZED   Final   Special Requests ADDED 0503 05/12/13   Final   Culture  Setup Time     Final   Value: 05/11/2013 05:40     Performed at [facility]   Colony Count     Final   Value: NO GROWTH     Performed at [facility]   Culture     Final   Value: NO GROWTH     Performed at [facility]  Report Status 05/13/2013 FINAL   Final  MRSA PCR SCREENING     Status: None   Collection Time    05/11/13 10:30 PM      Result Value Range Status   MRSA by PCR NEGATIVE  NEGATIVE Final   Comment:            The GeneXpert MRSA Assay (FDA     approved for NASAL specimens     only), is one component of a     comprehensive MRSA colonization     surveillance program. It is not     intended to diagnose MRSA     infection nor to guide or     monitor treatment for     MRSA infections.  CLOSTRIDIUM DIFFICILE BY PCR     Status: Abnormal   Collection Time    05/18/13 10:26 AM      Result Value Range Status   C difficile by pcr POSITIVE (*) NEGATIVE Final   Comment: CRITICAL RESULT CALLED TO, READ  BACK BY AND VERIFIED WITH:     MINOR A,RN 1600 05/18/13 SCALES H    Coagulation Studies: No results found for this basename: LABPROT, INR,  in the last 72 hours  Imaging: No results found.  Medications:  I have reviewed the patient's current medications. Scheduled: . antiseptic oral rinse  15 mL Mouth Rinse q12n4p  . atenolol  12.5 mg Oral Daily  . chlorhexidine  15 mL Mouth Rinse BID  . folic acid  1 mg Oral Daily  . influenza vac split quadrivalent PF  0.5 mL Intramuscular Tomorrow-1000  . metroNIDAZOLE  500 mg Oral Q8H  . pantoprazole  40 mg Oral BID  . thiamine  100 mg Oral Daily    Assessment/Plan: The patient continues to have symptoms of lack of orientation, inappropriate answers to questions at times, and posturing.  However, her new transient eyelid fluttering, gaze deviation, and disorientation likely represents seizure activity.  -give Keppra 1000 mg IV load once, followed by 500 mg PO BID -awaiting results from 14-3-3 protein and Tau protein, which may take an additional 2-3 weeks   LOS: 9 days   Janalyn Harder, PGY3 Pgr. [phone]  05/20/2013  12:34 PM  Patient seen and examined.  Clinical course and management discussed.  Necessary edits performed.  I agree with the above.  Assessment and plan of care developed.    Thana Farr, MD [facility] [phone]  05/20/2013  2:34 PM

## 2013-05-20 NOTE — Progress Notes (Signed)
[facility] PROGRESS NOTE  Joan [name] ZOX:[id number] DOB: 07-09-1960 DOA: 05/11/2013 PCP: No primary provider on file.  Brief narrative: 52 y.o. female who was brought to the ED after having decreased responsiveness for 2-3 days. She was unresponsive at presentation and her son gave the history. 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 He stated that she has not had any food or liquids for the last 203 days. She was diagnosed with a UTI on 04/05/2013 and was prescribed Bactrim therapy but she did not take the medication due to increased nausea and vomiting. Her son also reported that prior to this she had been falling down a lot and had sustained multiple bruises from her falls.  In the ED, she was evaluated and found to be minimally responsive and febrile. She was found to have a UTI, and and increased lactic acid level of 5. She was placed on IV Rocephin and IVFs for rehydration. A CT scan of the head was performed and was found to be negative for acute findings.  Patient admitted to stepdown. She underwent MRI brain and LP. transferred to medical floor for further care.   Assessment/Plan:  Toxic metabolic encephalopathy  -Initially felt to be  Combination of sepsis from  pyelonephritis and possible psychiatric issues/catatonia  - no acute findings on CT scan of the head  - MRI brain was concerning for either hypoxic vs CJD etiology. follow up MRI 12/18 showed improvement and Neuro feels changes were due to seizures.  -EEG reported abnormal with moderately severe continuous generalized nonspecific slowing of cerebral activity.  No evidence of an epileptic disorder was demonstrated.  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 She is able to tell her name and respond to simple commands at  this time. Mumbles incomprehensible words and laughs periodically.  Acute renal failure  -Likely prerenal with poor po intake - improved with hydration   cdiff Started on empiric flagyl.diarrhea resolved.   Hypernatremia  - improved with 1/2 NS  Tachycardia  -Stable after BB resumed.    UTI / pyelonephritis  -culture negative. Completed  rocephin ( received for almost 7 days)   Hx of catatonia  -Son reported two prior episodes but states they have been quite infrequent  - does not appear to be followed chronically by Psychiatry or Neurology .  Code Status: FULL   Family Communication: updated son on 12/21  Disposition Plan: SNF likely in 2 days  Consultants:  Neurology   Procedures LP  Antibiotics:  Rocephin 12/13 >> 12/20 Flagyl (12/20>>)  DVT prophylaxis:  SCDs     HPI/Subjective: No overnight issues.  Objective: Filed Vitals:   05/20/13 0541  BP: 120/64  Pulse: 98  Temp: 99.9 F (37.7 C)  Resp: 16    Intake/Output Summary (Last 24 hours) at 05/20/13 0941 Last data filed at 05/20/13 0548  Gross per 24 hour  Intake      0 ml  Output   1925 ml  Net  -1925 ml   Filed Weights   05/11/13 2205 05/17/13 1520  Weight: 70.1 kg (154 lb 8.7 oz) 70.2 kg (154 lb 12.2 oz)    Exam:   General:  Middle aged female in NAD   HEENT: no pallor, moist mucosa   chest: clear b/l, no added sounds  CVS: NS1&S2, no murmurs, rubs or gallop  Abd: soft, NT, ND, BS+'  Ext: warm, no edema  CNS: AAOX0, follows simple commands, able to tell her name . Mumbles incomprehensible words and giggles in between, . weakness of extremities more pronounced over right . [name]    Data Reviewed: Basic Metabolic Panel:  Recent Labs Lab 05/14/13 0450 05/15/13 0939 05/16/13 0940 05/18/13 0520  NA 147* 140 138 137  K 3.5 3.2* 3.8 3.0*  CL 116* 105 102 103  CO2 18* 20 21 21   GLUCOSE 64* 73 69* 76  BUN 17 9 7  5*  CREATININE 0.89 0.72 0.65 0.65  CALCIUM 8.4 8.3* 8.5  7.9*   Liver Function Tests:  Recent Labs Lab 05/14/13 0450 05/16/13 0940  AST 26 34  ALT 16 19  ALKPHOS 36* 41  BILITOT 0.4 0.6  PROT 5.7* 6.1  ALBUMIN 2.6* 2.8*   No results found for this basename: LIPASE, AMYLASE,  in the last 168 hours No results found for this basename: AMMONIA,  in the last 168 hours CBC:  Recent Labs Lab 05/14/13 0450 05/14/13 1215 05/15/13 0939 05/16/13 0940 05/18/13 0520  WBC 5.4 5.5 5.5 5.2 5.3  HGB 8.5* 9.0* 9.1* 9.7* 8.9*  HCT 25.5* 26.9* 27.4* 28.0* 26.1*  MCV 90.7 91.8 90.1 88.1 88.5  PLT 40* 43* 42* 51* 82*   Cardiac Enzymes:  Recent Labs Lab 05/13/13 1755  CKTOTAL 79   BNP (last 3 results) No results found for this basename: PROBNP,  in the last 8760 hours CBG: No results found for this basename: GLUCAP,  in the last 168 hours  Recent Results (from the past 240 hour(s))  URINE CULTURE     Status: None   Collection Time    05/11/13  7:02 PM      Result Value Range Status   Specimen Description URINE, CATHETERIZED   Final   Special Requests ADDED 0503 05/12/13   Final   Culture  Setup Time     Final   Value: 05/11/2013 05:40     Performed at [facility]   Colony Count     Final   Value: NO GROWTH     Performed at [facility]   Culture     Final   Value: NO GROWTH     Performed at [facility]   Report Status 05/13/2013 FINAL   Final  MRSA PCR SCREENING     Status: None   Collection Time    05/11/13 10:30 PM      Result Value Range Status   MRSA by PCR NEGATIVE  NEGATIVE Final   Comment:            The GeneXpert MRSA Assay (FDA     approved for NASAL specimens     only), is one component of a     comprehensive MRSA colonization     surveillance program. It is not     intended to diagnose MRSA     infection nor to guide or     monitor treatment for     MRSA infections.  CLOSTRIDIUM DIFFICILE BY PCR     Status: Abnormal   Collection Time    05/18/13 10:26 AM      Result Value Range  Status   C difficile by pcr POSITIVE (*) NEGATIVE Final   Comment: CRITICAL RESULT CALLED TO, READ BACK BY AND VERIFIED WITH:     MINOR A,RN 1600 05/18/13 SCALES H  Studies: No results found.  Scheduled Meds: . antiseptic oral rinse  15 mL Mouth Rinse q12n4p  . atenolol  12.5 mg Oral Daily  . chlorhexidine  15 mL Mouth Rinse BID  . folic acid  1 mg Oral Daily  . influenza vac split quadrivalent PF  0.5 mL Intramuscular Tomorrow-1000  . metroNIDAZOLE  500 mg Oral Q8H  . pantoprazole  40 mg Oral BID  . thiamine  100 mg Oral Daily   Continuous Infusions: . sodium chloride 1,000 mL (05/18/13 2236)     Time spent: 25 minutes    Lexi Conaty  [facility] Pager [phone]. If 7PM-7AM, please contact night-coverage at [email or web address], password [facility] 05/20/2013, 9:41 AM  LOS: 9 days

## 2013-05-20 NOTE — Progress Notes (Signed)
UR complete.  Alecsander Hattabaugh RN, MSN 

## 2013-05-20 NOTE — Progress Notes (Signed)
Physical Therapy Treatment Patient Details Name: Joan [name] MRN: [id number] DOB: 1960/08/28 Today's Date: 05/20/2013 Time: 9147-8295 PT Time Calculation (min): 27 min  PT Assessment / Plan / Recommendation  History of Present Illness Joan [name] is a 52 y.o. female who was brought to the ED after having decreased responsiveness for the past 2-3 days.  She is unresponsive at this time and her son gives the history.  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  He states that she has not had any food or liquids during this time as well.   She was diagnosed with a UTI on 04/05/2013 and was prescribed Bactrim therapy but she did not take the medication due to increased nausea and vomiting.   Her son also reports that prior to this she had been falling down a lot and has multiple bruises from her falls.    PT Comments   Patient progressing in order to get to recliner. Patient still very limited by her cognitive status. Patient fixated on seeing little girl crawling on the wall throughout the session. Continue to recommend SNF for ongoing Physical Therapy.     Follow Up Recommendations        Does the patient have the potential to tolerate intense rehabilitation     Barriers to Discharge        Equipment Recommendations       Recommendations for Other Services    Frequency Min 3X/week   Progress towards PT Goals Progress towards PT goals: Progressing toward goals  Plan Current plan remains appropriate    Precautions / Restrictions Precautions Precautions: Fall   Pertinent Vitals/Pain no apparent distress     Mobility  Bed Mobility Supine to Sit: 1: +2 Total assist;HOB elevated Supine to Sit: Patient Percentage: 20% Sitting - Scoot to Edge of Bed: 1: +1 Total assist;With rail Details for Bed Mobility Assistance: assisted legs off bed and trunk upright; Patient with limited assistance to help to edge of bed. Unable to grab for  rail Transfers Sit to Stand: From bed;1: +2 Total assist Sit to Stand: Patient Percentage: 10% Stand to Sit: 1: +2 Total assist;To chair/3-in-1 Stand to Sit: Patient Percentage: 10% Squat Pivot Transfers: 1: +2 Total assist Squat Pivot Transfers: Patient Percentage: 10% Details for Transfer Assistance: A for all aspects. Patient limited weight bearing through LEs. Fearful of falling Ambulation/Gait Ambulation/Gait Assistance: Not tested (comment)    Exercises     PT Diagnosis:    PT Problem List:   PT Treatment Interventions:     PT Goals (current goals can now be found in the care plan section)    Visit Information  Last PT Received On: 05/20/13 Assistance Needed: +2 History of Present Illness: Joan [name] is a 53 y.o. female who was brought to the ED after having decreased responsiveness for the past 2-3 days.  She is unresponsive at this time and her son gives the history.  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  He states that she has not had any food or liquids during this time as well.   She was diagnosed with a UTI on 04/05/2013 and was prescribed Bactrim therapy but she did not take the medication due to increased nausea and vomiting.   Her son also reports that prior to this she had been falling down a lot and has multiple bruises from her falls.  Subjective Data      Cognition  Cognition Arousal/Alertness: Awake/alert Overall Cognitive Status: Impaired/Different from baseline Area of Impairment: Orientation;Attention;Following commands;Problem solving Orientation Level: Time;Situation Current Attention Level: Focused Following Commands: Follows one step commands inconsistently;Follows one step commands with increased time Problem Solving: Slow processing;Decreased initiation;Requires verbal cues;Requires tactile cues General Comments: Patient stating that she was seeing a little girl crawling on the walls and  patient would attempt to talk to the little girl    Balance  Static Sitting Balance Static Sitting - Balance Support: Feet unsupported;Bilateral upper extremity supported Static Sitting - Level of Assistance: 2: Max assist Static Sitting - Comment/# of Minutes: Patient very far forward then sideways as she was attempting to follow the "little girl on the wall"  End of Session PT - End of Session Activity Tolerance: Patient limited by fatigue Patient left: in bed Nurse Communication: Mobility status   GP     Fredrich Birks 05/20/2013, 12:42 PM 05/20/2013 Fredrich Birks PTA [phone] pager [phone] office

## 2013-05-20 NOTE — Progress Notes (Signed)
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  Patient s/p bedside swallow evaluation 12/18 -- presented with moderate oral dysphagia.  Advanced to Dys 1--thin liquid diet.  PO intake poor at 10-25% per flowsheet records.  Noted patient positive for C.diff.  Would benefit from addition of nutrition supplements -- RD to order.  Receiving folic acid & thiamine daily.  Height: Ht Readings from Last 1 Encounters:  05/17/13 5\' 1"  (1.549 m)    Weight Status:   Wt Readings from Last 1 Encounters:  05/17/13 154 lb 12.2 oz (70.2 kg)    Re-estimated needs:  Kcal: 1700-1900 Protein: 80-90 gm Fluid: 1.7-1.9 L  Skin: Intact  Diet Order: Dysphagia 1, thin liquids   Intake/Output Summary (Last 24 hours) at 05/20/13 1318 Last data filed at 05/20/13 0548  Gross per 24 hour  Intake      0 ml  Output   1925 ml  Net  -1925 ml    Labs:   Recent Labs Lab 05/15/13 0939 05/16/13 0940 05/18/13 0520  NA 140 138 137  K 3.2* 3.8 3.0*  CL 105 102 103  CO2 20 21 21   BUN 9 7 5*  CREATININE 0.72 0.65 0.65  CALCIUM 8.3* 8.5 7.9*  GLUCOSE 73 69* 76    Scheduled Meds: . antiseptic oral rinse  15 mL Mouth Rinse q12n4p  . atenolol  12.5 mg Oral Daily  . chlorhexidine  15 mL Mouth Rinse BID  . folic acid  1 mg Oral Daily  . influenza vac split quadrivalent PF  0.5 mL Intramuscular Tomorrow-1000  . metroNIDAZOLE  500 mg Oral Q8H  .  pantoprazole  40 mg Oral BID  . thiamine  100 mg Oral Daily    Continuous Infusions:   Maureen Chatters, RD, LDN Pager #: [phone] After-Hours Pager #: [phone]

## 2013-05-21 LAB — CBC
HCT: 27.2 % — ABNORMAL LOW (ref 36.0–46.0)
Hemoglobin: 9.2 g/dL — ABNORMAL LOW (ref 12.0–15.0)
MCH: 30.2 pg (ref 26.0–34.0)
MCHC: 33.8 g/dL (ref 30.0–36.0)
RBC: 3.05 MIL/uL — ABNORMAL LOW (ref 3.87–5.11)
WBC: 4.8 10*3/uL (ref 4.0–10.5)

## 2013-05-21 LAB — BASIC METABOLIC PANEL
BUN: <3 mg/dL — ABNORMAL LOW (ref 6–23)
CO2: 20 mEq/L (ref 19–32)
Calcium: 8.1 mg/dL — ABNORMAL LOW (ref 8.4–10.5)
GFR calc non Af Amer: >90 mL/min (ref >90)
Glucose, Bld: 81 mg/dL (ref 70–99)
Potassium: 3.2 mEq/L — ABNORMAL LOW (ref 3.5–5.1)

## 2013-05-21 MED ORDER — LORAZEPAM 2 MG/ML IJ SOLN
INTRAMUSCULAR | Status: AC
  Filled 2013-05-21: qty 1

## 2013-05-21 MED ORDER — LEVETIRACETAM 500 MG PO TABS
1000.0000 mg | ORAL_TABLET | Freq: Two times a day (BID) | ORAL | Status: DC
  Administered 2013-05-21 – 2013-05-27 (×12): 1000 mg via ORAL
  Filled 2013-05-21 (×13): qty 2

## 2013-05-21 MED ORDER — SODIUM CHLORIDE 0.9 % IV SOLN
1000.0000 mg | Freq: Once | INTRAVENOUS | Status: AC
  Administered 2013-05-21: 1000 mg via INTRAVENOUS
  Filled 2013-05-21: qty 10

## 2013-05-21 MED ORDER — LORAZEPAM 2 MG/ML IJ SOLN
1.0000 mg | Freq: Once | INTRAMUSCULAR | Status: AC
  Administered 2013-05-21: 1 mg via INTRAVENOUS

## 2013-05-21 MED ORDER — LORAZEPAM 2 MG/ML IJ SOLN: 1.0000 mg | Freq: Four times a day (QID) | INTRAMUSCULAR | Status: DC | PRN

## 2013-05-21 MED ORDER — SODIUM CHLORIDE 0.9 % IV SOLN
500.0000 mg | Freq: Once | INTRAVENOUS | Status: DC
  Filled 2013-05-21: qty 5

## 2013-05-21 NOTE — Progress Notes (Signed)
Subjective: On entering the room the patient is sitting up in the bed not responding with left eye deviation.  On yesterday was awake and alert and moving all extremities against gravity to command and spontaneously.    Objective: Current vital signs: BP 136/81  Pulse 101  Temp(Src) 99.9 F (37.7 C) (Axillary)  Resp 16  Ht 5\' 1"  (1.549 m)  Wt 70.2 kg (154 lb 12.2 oz)  BMI 29.26 kg/m2  SpO2 100% Vital signs in last 24 hours: Temp:  [98.1 F (36.7 C)-99.9 F (37.7 C)] 99.9 F (37.7 C) (12/23 1020) Pulse Rate:  [96-102] 101 (12/23 1020) Resp:  [16-20] 16 (12/23 1020) BP: (112-142)/(68-81) 136/81 mmHg (12/23 1020) SpO2:  [100 %] 100 % (12/23 1020)  Intake/Output from previous day:   Intake/Output this shift:   Nutritional status: Dysphagia  Neurologic Exam: Mental Status: Unresponsive.  Does not follow commands or speak.   Cranial Nerves: II: Pupils equal, round, reactive to light and accommodation [name],IV, VI: left eye deviation V,VII: face symmetric Motor: No spontaneous movement of extremities noted.   Sensory: Does not respond to noxious stimuli Deep Tendon Reflexes: 1+ and symmetric throughout   Lab Results: Basic Metabolic Panel:  Recent Labs Lab 05/15/13 0939 05/16/13 0940 05/18/13 0520 05/21/13 0617  NA 140 138 137 139  K 3.2* 3.8 3.0* 3.2*  CL 105 102 103 104  CO2 20 21 21 20   GLUCOSE 73 69* 76 81  BUN 9 7 5* <3*  CREATININE 0.72 0.65 0.65 0.59  CALCIUM 8.3* 8.5 7.9* 8.1*    Liver Function Tests:  Recent Labs Lab 05/16/13 0940  AST 34  ALT 19  ALKPHOS 41  BILITOT 0.6  PROT 6.1  ALBUMIN 2.8*   No results found for this basename: LIPASE, AMYLASE,  in the last 168 hours No results found for this basename: AMMONIA,  in the last 168 hours  CBC:  Recent Labs Lab 05/14/13 1215 05/15/13 0939 05/16/13 0940 05/18/13 0520 05/21/13 0617  WBC 5.5 5.5 5.2 5.3 4.8  HGB 9.0* 9.1* 9.7* 8.9* 9.2*  HCT 26.9* 27.4* 28.0* 26.1* 27.2*  MCV 91.8  90.1 88.1 88.5 89.2  PLT 43* 42* 51* 82* 183    Cardiac Enzymes: No results found for this basename: CKTOTAL, CKMB, CKMBINDEX, TROPONINI,  in the last 168 hours  Lipid Panel: No results found for this basename: CHOL, TRIG, HDL, CHOLHDL, VLDL, LDLCALC,  in the last 168 hours  CBG: No results found for this basename: GLUCAP,  in the last 168 hours  Microbiology: Results for orders placed during the hospital encounter of 05/11/13  URINE CULTURE     Status: None   Collection Time    05/11/13  7:02 PM      Result Value Range Status   Specimen Description URINE, CATHETERIZED   Final   Special Requests ADDED 0503 05/12/13   Final   Culture  Setup Time     Final   Value: 05/11/2013 05:40     Performed at [facility]   Colony Count     Final   Value: NO GROWTH     Performed at [facility]   Culture     Final   Value: NO GROWTH     Performed at [facility]   Report Status 05/13/2013 FINAL   Final  MRSA PCR SCREENING     Status: None   Collection Time    05/11/13 10:30 PM      Result Value  Range Status   MRSA by PCR NEGATIVE  NEGATIVE Final   Comment:            The GeneXpert MRSA Assay (FDA     approved for NASAL specimens     only), is one component of a     comprehensive MRSA colonization     surveillance program. It is not     intended to diagnose MRSA     infection nor to guide or     monitor treatment for     MRSA infections.  CLOSTRIDIUM DIFFICILE BY PCR     Status: Abnormal   Collection Time    05/18/13 10:26 AM      Result Value Range Status   C difficile by pcr POSITIVE (*) NEGATIVE Final   Comment: CRITICAL RESULT CALLED TO, READ BACK BY AND VERIFIED WITH:     MINOR A,RN 1600 05/18/13 SCALES H    Coagulation Studies: No results found for this basename: LABPROT, INR,  in the last 72 hours  Imaging: No results found.  Medications:  I have reviewed the patient's current medications. Scheduled: . antiseptic oral rinse  15 mL Mouth  Rinse q12n4p  . atenolol  12.5 mg Oral Daily  . chlorhexidine  15 mL Mouth Rinse BID  . feeding supplement (RESOURCE BREEZE)  1 Container Oral BID BM  . folic acid  1 mg Oral Daily  . influenza vac split quadrivalent PF  0.5 mL Intramuscular Tomorrow-1000  . levETIRAcetam  1,000 mg Intravenous Once  . levETIRAcetam  1,000 mg Oral BID  . LORazepam      . metroNIDAZOLE  500 mg Oral Q8H  . pantoprazole  40 mg Oral BID  . thiamine  100 mg Oral Daily    Assessment/Plan: Patient appears to be having a seizure.  Is on Keppra at 500mg .  This does not appear to be a sufficient dose.    Recommendations: 1.  Ativan 1mg  now.  May continue prn as needed. 2.  Keppra 1000mg  IV now 3.  Increase maintenance Keppra to 1000mg  BID   LOS: 10 days   Thana Farr, MD [facility] [phone] 05/21/2013  11:00 AM

## 2013-05-21 NOTE — Progress Notes (Signed)
[facility] PROGRESS NOTE  Samira Acero ZOX:[id number] DOB: 1961/05/04 DOA: 05/11/2013 PCP: No primary provider on file.  Brief narrative:  52 y.o. female who was brought to the ED after having decreased responsiveness for 2-3 days. She was unresponsive at presentation and her son gave the history. 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 He stated that she has not had any food or liquids for the last 203 days. She was diagnosed with a UTI on 04/05/2013 and was prescribed Bactrim therapy but she did not take the medication due to increased nausea and vomiting. Her son also reported that prior to this she had been falling down a lot and had sustained multiple bruises from her falls.  In the ED, she was evaluated and found to be minimally responsive and febrile. She was found to have a UTI, and and increased lactic acid level of 5. She was placed on IV Rocephin and IVFs for rehydration. A CT scan of the head was performed and was found to be negative for acute findings.  Patient admitted to stepdown. She underwent MRI brain and LP. transferred to medical floor for further care.   Assessment/Plan:  Toxic metabolic encephalopathy  -Initially felt to be Combination of sepsis from pyelonephritis and possible psychiatric issues/catatonia  - no acute findings on CT scan of the head  - MRI brain was concerning for either hypoxic vs CJD etiology. follow up MRI 12/18 showed improvement and Neurology  feels changes were due to seizures.  -EEG reported abnormal with moderately severe continuous generalized nonspecific slowing of cerebral activity. No evidence of an epileptic disorder was demonstrated.  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 She is able to tell her name and respond to simple commands at this time.  Mumbles incomprehensible words and laughs periodically.   acute seizure  on 12/23 patient was noted by neurologist to be poorly responsive with a lateral gaze.. She appeared to be having active seizure. Ordered 1 gm IV keppra and placed on oral keppra bid which she should continue. Continue seizure precautions. Patient appeared more alert and oriented on my evaluation later on. Ativan IV prn for seizures  Acute renal failure  -Likely prerenal with poor po intake  - improved with hydration   cdiff  Started on empiric flagyl.diarrhea resolved.   Hypernatremia  - improved with 1/2 NS   Tachycardia  -Stable after BB resumed.   UTI / pyelonephritis  -culture negative. Completed rocephin ( received for almost 7 days)   Hx of catatonia  -Son reported two prior episodes but states they have been quite infrequent  - does not appear to be followed chronically by Psychiatry or Neurology .   Code Status: FULL  Family Communication: updated son on 12/21  Disposition Plan: SNF likely tomorrow if seizure free  Consultants:  Neurology   Procedures  LP   Antibiotics:  Rocephin 12/13 >> 12/20  Flagyl (12/20>>)   DVT prophylaxis:  SCDs   HPI/Subjective:  Patient noted t be unresponsive and with left lateral gaze concerning for acute seizure. Ordered 1 mg IV ativan followed by 1 gm IV keppra and dose increased to 1000 mg bid. On my evaluation later she was on a chair and responding to few questions. She complained fo pain over bilateral calf L>R.    Objective: Filed Vitals:  05/21/13 1020  BP: 136/81  Pulse: 101  Temp: 99.9 F (37.7 C)  Resp: 16   No intake or output data in the 24 hours ending 05/21/13 1221 Filed Weights   05/11/13 2205 05/17/13 1520  Weight: 70.1 kg (154 lb 8.7 oz) 70.2 kg (154 lb 12.2 oz)    Exam: General: Middle aged female in NAD  HEENT: no pallor, moist mucosa  chest: clear b/l, no added sounds  CVS: NS1&S2, no murmurs, rubs or gallop  Abd: soft,  NT, ND, BS+' Ext: warm, no edema CNS:  AAOX0, follows simple commands, able to tell her name . Mumbles incomprehensible words  in between, . weakness of extremities more pronounced over right . [name]  Data Reviewed: Basic Metabolic Panel:  Recent Labs Lab 05/15/13 0939 05/16/13 0940 05/18/13 0520 05/21/13 0617  NA 140 138 137 139  K 3.2* 3.8 3.0* 3.2*  CL 105 102 103 104  CO2 20 21 21 20   GLUCOSE 73 69* 76 81  BUN 9 7 5* <3*  CREATININE 0.72 0.65 0.65 0.59  CALCIUM 8.3* 8.5 7.9* 8.1*   Liver Function Tests:  Recent Labs Lab 05/16/13 0940  AST 34  ALT 19  ALKPHOS 41  BILITOT 0.6  PROT 6.1  ALBUMIN 2.8*   No results found for this basename: LIPASE, AMYLASE,  in the last 168 hours No results found for this basename: AMMONIA,  in the last 168 hours CBC:  Recent Labs Lab 05/15/13 0939 05/16/13 0940 05/18/13 0520 05/21/13 0617  WBC 5.5 5.2 5.3 4.8  HGB 9.1* 9.7* 8.9* 9.2*  HCT 27.4* 28.0* 26.1* 27.2*  MCV 90.1 88.1 88.5 89.2  PLT 42* 51* 82* 183   Cardiac Enzymes: No results found for this basename: CKTOTAL, CKMB, CKMBINDEX, TROPONINI,  in the last 168 hours BNP (last 3 results) No results found for this basename: PROBNP,  in the last 8760 hours CBG: No results found for this basename: GLUCAP,  in the last 168 hours  Recent Results (from the past 240 hour(s))  URINE CULTURE     Status: None   Collection Time    05/11/13  7:02 PM      Result Value Range Status   Specimen Description URINE, CATHETERIZED   Final   Special Requests ADDED 0503 05/12/13   Final   Culture  Setup Time     Final   Value: 05/11/2013 05:40     Performed at [facility]   Colony Count     Final   Value: NO GROWTH     Performed at [facility]   Culture     Final   Value: NO GROWTH     Performed at [facility]   Report Status 05/13/2013 FINAL   Final  MRSA PCR SCREENING     Status: None   Collection Time    05/11/13 10:30 PM      Result Value Range Status    MRSA by PCR NEGATIVE  NEGATIVE Final   Comment:            The GeneXpert MRSA Assay (FDA     approved for NASAL specimens     only), is one component of a     comprehensive MRSA colonization     surveillance program. It is not     intended to diagnose MRSA     infection nor to guide or     monitor treatment for     MRSA infections.  CLOSTRIDIUM  DIFFICILE BY PCR     Status: Abnormal   Collection Time    05/18/13 10:26 AM      Result Value Range Status   C difficile by pcr POSITIVE (*) NEGATIVE Final   Comment: CRITICAL RESULT CALLED TO, READ BACK BY AND VERIFIED WITH:     MINOR A,RN 1600 05/18/13 SCALES H     Studies: No results found.  Scheduled Meds: . antiseptic oral rinse  15 mL Mouth Rinse q12n4p  . atenolol  12.5 mg Oral Daily  . chlorhexidine  15 mL Mouth Rinse BID  . feeding supplement (RESOURCE BREEZE)  1 Container Oral BID BM  . folic acid  1 mg Oral Daily  . influenza vac split quadrivalent PF  0.5 mL Intramuscular Tomorrow-1000  . levETIRAcetam  1,000 mg Oral BID  . LORazepam      . metroNIDAZOLE  500 mg Oral Q8H  . pantoprazole  40 mg Oral BID  . thiamine  100 mg Oral Daily   Continuous Infusions:     Time spent: 25 minutes    Terriona Horlacher  [facility] Pager [phone]. If 7PM-7AM, please contact night-coverage at [email or web address], password [facility] 05/21/2013, 12:21 PM  LOS: 10 days

## 2013-05-21 NOTE — Progress Notes (Signed)
Speech Language Pathology Treatment: Dysphagia  Patient Details Name: Joan [name] MRN: [id number] DOB: [birth year] Today's Date: 05/21/2013 Time: 1100-1111 SLP Time Calculation (min): 11 min  Assessment / Plan / Recommendation Clinical Impression  Pt with intermittent, intelligible verbalizations, non-relevant to current situation.  Consumed thin liquids, Magic cup with total assist for feeding today- unable to hold cup or bring to mouth.  Slowed mastication, inconsistent awareness of POs, but no s/s of airway compromise.  Dysphagia should resolve with concomitant improvements in mental status.     HPI HPI: 52 y.o. female with history of fibromyalgia, anxiety, catatonia, gastric bypass who was brought to the ED after having decreased responsiveness for the past 2-3 days. She is unresponsive at this time and her son gives the history. 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 He states that she has not had any food or liquids during this time as well. She was diagnosed with a UTI on 04/05/2013 and was prescribed Bactrim therapy but she did not take the medication due to increased nausea and vomiting.  In the ED, she was evaluated and found to be minimally responsive and febrile. She was found to have a UTI, A CT scan of the head was performed and was found to be negative for acute findings.  CXR No acute  cardiopulmonary disease.      SLP Plan  Continue with current plan of care    Recommendations Diet recommendations: Dysphagia 1 (puree);Thin liquid Liquids provided via: Cup;Straw Medication Administration: Whole meds with liquid Supervision: Staff to assist with self feeding Compensations: Slow rate;Small sips/bites;Check for pocketing Postural Changes and/or Swallow Maneuvers: Seated upright 90 degrees              Plan: Continue with current plan of care      Inioluwa Baris L. Samson Frederic, [place] CCC/SLP Pager [phone]  Blenda Mounts  Laurice 05/21/2013, 11:18 AM

## 2013-05-21 NOTE — Progress Notes (Signed)
Pt in and out of unresponsiveness to responsiveness was fully awake from 10 am  Until after 1 mg ativan given at 0922 1430 and went back to sleep.  Awake at 1630 but refused dinner. No acute change from pt's baseline. . Rn will continue to monitor. no seizure activity noted at this time.

## 2013-05-21 NOTE — Progress Notes (Signed)
Bed offer in place from [facility] which is bed of choice per patent's son Eusebio Me.  Awaiting stability per MD.  Nursing notes report patient continues to move "in and out of responsiveness"- will need to clarify with MD.  Patient has Medicare as her primary insurance rather that BCBS as previously thought. CSW will notify Financial Services tomorrow to make change.  MD- please advise re: stability and sign FL2 on chart if you have not already done so. Many thanks!  Lorri Frederick. [place]  [phone]

## 2013-05-21 NOTE — Progress Notes (Signed)
Physical Therapy Treatment Patient Details Name: Joan [name] MRN: [id number] DOB: 01-23-1961 Today's Date: 05/21/2013 Time: 3086-5784 PT Time Calculation (min): 17 min  PT Assessment / Plan / Recommendation  History of Present Illness Joan [name] is a 52 y.o. female who was brought to the ED after having decreased responsiveness for the past 2-3 days.  She is unresponsive at this time and her son gives the history.  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  He states that she has not had any food or liquids during this time as well.   She was diagnosed with a UTI on 04/05/2013 and was prescribed Bactrim therapy but she did not take the medication due to increased nausea and vomiting.   Her son also reports that prior to this she had been falling down a lot and has multiple bruises from her falls.    PT Comments   Patient limited by overall lack of participation this session. Very resistive to movement and pushing heavily posteriorly when sitting up. Patient not following one steps commands this session but will at times shake her head "no" when ask to lean forwards or to lift legs  Follow Up Recommendations  SNF     Does the patient have the potential to tolerate intense rehabilitation     Barriers to Discharge        Equipment Recommendations       Recommendations for Other Services    Frequency Min 3X/week   Progress towards PT Goals Progress towards PT goals: Progressing toward goals  Plan Current plan remains appropriate    Precautions / Restrictions Precautions Precautions: Fall   Pertinent Vitals/Pain no apparent distress     Mobility  Bed Mobility Supine to Sit: 1: +2 Total assist;HOB elevated Supine to Sit: Patient Percentage: 0% Sitting - Scoot to Edge of Bed: 1: +1 Total assist;With rail Transfers Sit to Stand: From bed;1: +2 Total assist Sit to Stand: Patient Percentage: 10% Stand to Sit: 1: +2 Total assist;To  chair/3-in-1 Stand to Sit: Patient Percentage: 10% Squat Pivot Transfers: 1: +2 Total assist Squat Pivot Transfers: Patient Percentage: 10% Details for Transfer Assistance: A for all aspects. Patient limited weight bearing through LEs. Pushing with posterior lean.  Ambulation/Gait Ambulation/Gait Assistance: Not tested (comment)    Exercises     PT Diagnosis:    PT Problem List:   PT Treatment Interventions:     PT Goals (current goals can now be found in the care plan section)    Visit Information  Last PT Received On: 05/21/13 Assistance Needed: +2 History of Present Illness: Joan [name] is a 52 y.o. female who was brought to the ED after having decreased responsiveness for the past 2-3 days.  She is unresponsive at this time and her son gives the history.  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  He states that she has not had any food or liquids during this time as well.   She was diagnosed with a UTI on 04/05/2013 and was prescribed Bactrim therapy but she did not take the medication due to increased nausea and vomiting.   Her son also reports that prior to this she had been falling down a lot and has multiple bruises from her falls.     Subjective Data      Cognition  Cognition Arousal/Alertness: Awake/alert Overall Cognitive Status: Impaired/Different from baseline Area of  Impairment: Orientation;Attention;Following commands;Problem solving Orientation Level: Time;Situation Following Commands: Follows one step commands  Problem Solving: Slow processing;Decreased initiation;Requires verbal cues;Requires tactile cues    Balance  Static Sitting Balance Static Sitting - Balance Support: Feet unsupported;Bilateral upper extremity supported Static Sitting - Level of Assistance: 1: +1 Total assist Static Sitting - Comment/# of Minutes: Patient pushing posteriorly very hard this session. Sat ~5 minutes  End of Session PT - End  of Session Equipment Utilized During Treatment: Gait belt Patient left: in bed Nurse Communication: Mobility status   GP     Fredrich Birks 05/21/2013, 11:26 AM 05/21/2013 Fredrich Birks PTA [phone] pager [phone] office

## 2013-05-22 ENCOUNTER — Inpatient Hospital Stay (HOSPITAL_COMMUNITY): Payer: Medicare Other

## 2013-05-22 DIAGNOSIS — E876 Hypokalemia: Secondary | ICD-10-CM

## 2013-05-22 MED ORDER — LORAZEPAM 2 MG/ML IJ SOLN: 1.0000 mg | Freq: Once | INTRAMUSCULAR | Status: DC

## 2013-05-22 MED ORDER — VALPROATE SODIUM 500 MG/5ML IV SOLN
1500.0000 mg | Freq: Once | INTRAVENOUS | Status: AC
  Administered 2013-05-22: 1500 mg via INTRAVENOUS
  Filled 2013-05-22 (×2): qty 15

## 2013-05-22 MED ORDER — POTASSIUM CHLORIDE CRYS ER 20 MEQ PO TBCR: 40.0000 meq | EXTENDED_RELEASE_TABLET | Freq: Once | ORAL | Status: DC

## 2013-05-22 MED ORDER — VALPROATE SODIUM 500 MG/5ML IV SOLN
500.0000 mg | Freq: Two times a day (BID) | INTRAVENOUS | Status: DC
  Administered 2013-05-23 – 2013-05-27 (×10): 500 mg via INTRAVENOUS
  Filled 2013-05-22 (×11): qty 5

## 2013-05-22 NOTE — Progress Notes (Addendum)
[facility] PROGRESS NOTE  Joan [name] ZOX:[id number] DOB: 08/28/1960 DOA: 05/11/2013 PCP: No primary provider on file.  Brief narrative:  52 y.o. female who was brought to the ED after having decreased responsiveness for 2-3 days. She was unresponsive at presentation and her son gave the history. 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 He stated that she has not had any food or liquids for the last 203 days. She was diagnosed with a UTI on 04/05/2013 and was prescribed Bactrim therapy but she did not take the medication due to increased nausea and vomiting. Her son also reported that prior to this she had been falling down a lot and had sustained multiple bruises from her falls.  In the ED, she was evaluated and found to be minimally responsive and febrile. She was found to have a UTI, and and increased lactic acid level of 5. She was placed on IV Rocephin and IVFs for rehydration. A CT scan of the head was performed and was found to be negative for acute findings.  Patient admitted to stepdown. She underwent MRI brain and LP. transferred to medical floor for further care.   Assessment/Plan:  Toxic metabolic encephalopathy  -Initially felt to be Combination of sepsis from pyelonephritis and possible psychiatric issues/catatonia  - no acute findings on CT scan of the head  - MRI brain was concerning for either hypoxic vs CJD etiology. follow up MRI 12/18 showed improvement and Neurology feels changes were due to seizures.  -EEG reported abnormal with moderately severe continuous generalized nonspecific slowing of cerebral activity. No evidence of an epileptic disorder was demonstrated.  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 Check HSV and enterovirus PCR. -SLP recommended D1 diet with thin liquids/supervision  -mental status slowly improving  acute seizure  on 12/23 patient was noted by neurologist  to be poorly responsive with a lateral gaze.. She appeared to be having active seizure. Ordered 1 gm IV keppra and placed on oral keppra bid which she should continue. Ativan IV prn for seizures  -This morning patient has again been noted to be lethargic. Received keppra this am and neurology thinks this could be related to keppra. Will reassess later. Plan for EEG today. -Continue neuro checks  Acute renal failure  -Likely prerenal with poor po intake  -Resolved with hydration   cdiff  Started on empiric flagyl.diarrhea resolved.   Hypernatremia  - improved with 1/2 NS   Tachycardia  -Stable after BB resumed.   UTI / pyelonephritis  -culture negative. Completed rocephin ( received for almost 7 days)   Hx of catatonia  -Son reported two prior episodes but states they have been quite infrequent  - does not appear to be followed chronically by Psychiatry or Neurology .   Code Status: FULL  Family Communication: We'll update some Disposition Plan: will hold d/c with plan on EEG today and monitor another 48 hrs for seizure free interval  Consultants:  Neurology    Procedures  LP  Antibiotics:  Rocephin 12/13 >> 12/20  Flagyl (12/20>>)   DVT prophylaxis:  SCDs   HPI/Subjective:  No further witnessed seizures however patient noted to be lethargic and sleepy this morning.     Objective: Filed Vitals:   05/22/13 0930  BP: 125/74  Pulse: 90  Temp: 98.9 F (37.2 C)  Resp: 18    Intake/Output Summary (Last 24 hours) at 05/22/13 1527 Last data filed  at 05/22/13 0700  Gross per 24 hour  Intake    160 ml  Output      0 ml  Net    160 ml   Filed Weights   05/11/13 2205 05/17/13 1520  Weight: 70.1 kg (154 lb 8.7 oz) 70.2 kg (154 lb 12.2 oz)    Exam:  General: Middle aged female, sleepy HEENT: no pallor, moist mucosa  chest: clear b/l, no added sounds  CVS: NS1&S2, no murmurs, rubs or gallop  Abd: soft, NT, ND, BS+' Ext: warm, no edema  CNS:  AAOX0, sleepy  and lethargic  Data Reviewed: Basic Metabolic Panel:  Recent Labs Lab 05/16/13 0940 05/18/13 0520 05/21/13 0617  NA 138 137 139  K 3.8 3.0* 3.2*  CL 102 103 104  CO2 21 21 20   GLUCOSE 69* 76 81  BUN 7 5* <3*  CREATININE 0.65 0.65 0.59  CALCIUM 8.5 7.9* 8.1*   Liver Function Tests:  Recent Labs Lab 05/16/13 0940  AST 34  ALT 19  ALKPHOS 41  BILITOT 0.6  PROT 6.1  ALBUMIN 2.8*   No results found for this basename: LIPASE, AMYLASE,  in the last 168 hours No results found for this basename: AMMONIA,  in the last 168 hours CBC:  Recent Labs Lab 05/16/13 0940 05/18/13 0520 05/21/13 0617  WBC 5.2 5.3 4.8  HGB 9.7* 8.9* 9.2*  HCT 28.0* 26.1* 27.2*  MCV 88.1 88.5 89.2  PLT 51* 82* 183   Cardiac Enzymes: No results found for this basename: CKTOTAL, CKMB, CKMBINDEX, TROPONINI,  in the last 168 hours BNP (last 3 results) No results found for this basename: PROBNP,  in the last 8760 hours CBG: No results found for this basename: GLUCAP,  in the last 168 hours  Recent Results (from the past 240 hour(s))  CLOSTRIDIUM DIFFICILE BY PCR     Status: Abnormal   Collection Time    05/18/13 10:26 AM      Result Value Range Status   C difficile by pcr POSITIVE (*) NEGATIVE Final   Comment: CRITICAL RESULT CALLED TO, READ BACK BY AND VERIFIED WITH:     MINOR A,RN 1600 05/18/13 SCALES H     Studies: No results found.  Scheduled Meds: . antiseptic oral rinse  15 mL Mouth Rinse q12n4p  . atenolol  12.5 mg Oral Daily  . chlorhexidine  15 mL Mouth Rinse BID  . feeding supplement (RESOURCE BREEZE)  1 Container Oral BID BM  . folic acid  1 mg Oral Daily  . influenza vac split quadrivalent PF  0.5 mL Intramuscular Tomorrow-1000  . levETIRAcetam  1,000 mg Oral BID  . metroNIDAZOLE  500 mg Oral Q8H  . pantoprazole  40 mg Oral BID  . potassium chloride  40 mEq Oral Once  . thiamine  100 mg Oral Daily   Continuous Infusions:      Time spent: 35 minutes    Joan [name],  Joan [name]  [facility] Pager [phone] If 7PM-7AM, please contact night-coverage at [email or web address], password [facility] 05/22/2013, 3:27 PM  LOS: 11 days

## 2013-05-22 NOTE — Progress Notes (Signed)
MD made aware of low bp, Clydie Braun with [facility] advised RN to continue to monitor bp as long as patient is not in any distress and she is not. MD Amada Jupiter also made aware and advised RN to hold Ativan and continue Depacon and to inform internal medicine MD. RN will continue to monitor.

## 2013-05-22 NOTE — Procedures (Signed)
History: 52 yo F with AMS  Sedation: None  Background: There is a moderately well defined posterior dominant rhythm of 8 Hz. There is generalized irregualr delta and theta activity that becomes quasi rhythmic at times, though no clear evolution to suggest seizure. There are bursts of frontally predominant intermittent rhythmic delta activity(FIRDA) during the recording.   Photic stimulation: Physiologic driving is absent  EEG Abnormalities: 1) FIRDA 2) Generalized irregular delta activity.   Clinical Interpretation: This abnormal EEG is most consistent with a generalized non-specific cerebral dysfunction(encephalopathy). Though there were peiords of quasi-rhythmic activity, there was no evolution to characterize this definitively as seizure. There was no definite seizure or seizure predisposition recorded on this study.   [name], MD [facility] [phone]  If 7pm- 7am, please page neurology on call at [phone].

## 2013-05-22 NOTE — Progress Notes (Signed)
Subjective: Appears improved compared to exam yesterday.   Exam: Filed Vitals:   05/22/13 0530  BP: 112/53  Pulse: 98  Temp: 99.2 F (37.3 C)  Resp: 20   Gen: In bed, NAD MS: Awake, alert follows commands WU:[id number], EOMI, counts fingers in all fields.  Motor: Does not give good effort in any extremity.  Sensory:responds to noxsious stimuli x 4.   After my initial evaluation, she subsequently became more lethargic.    Impression: 52 yo F presented with unresponsiveness. 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 She has had improvement, though with some recurrent episodes. Increased lethargy this morning could be medication effect, though will get EEG to rule out ongoing seizure activity.   Recommendations: 1) Continue keppra 1000mg  BID.  2) EEG  [name], MD [facility] [phone]  If 7pm- 7am, please page neurology on call at [phone].

## 2013-05-22 NOTE — Progress Notes (Signed)
Routine EEG completed.  

## 2013-05-23 DIAGNOSIS — R4182 Altered mental status, unspecified: Secondary | ICD-10-CM

## 2013-05-23 MED ORDER — SODIUM CHLORIDE 0.9 % IV SOLN
INTRAVENOUS | Status: DC
  Administered 2013-05-23 – 2013-05-25 (×4): via INTRAVENOUS

## 2013-05-23 NOTE — Progress Notes (Signed)
[facility] PROGRESS NOTE  Joan [name] AVW:[id number] DOB: 1960-10-16 DOA: 05/11/2013 PCP: No primary provider on file.  Brief narrative:  52 y.o. female who was brought to the ED after having decreased responsiveness for 2-3 days. She was unresponsive at presentation and her son gave the history. 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 He stated that she has not had any food or liquids for the last 203 days. She was diagnosed with a UTI on 04/05/2013 and was prescribed Bactrim therapy but she did not take the medication due to increased nausea and vomiting. Her son also reported that prior to this she had been falling down a lot and had sustained multiple bruises from her falls.  In the ED, she was evaluated and found to be minimally responsive and febrile. She was found to have a UTI, and and increased lactic acid level of 5. She was placed on IV Rocephin and IVFs for rehydration. A CT scan of the head was performed and was found to be negative for acute findings.  Patient admitted to stepdown. She underwent MRI brain and LP. transferred to medical floor for further care.  Assessment/Plan:  Toxic metabolic encephalopathy  -Initially felt to be Combination of sepsis from pyelonephritis and possible psychiatric issues/catatonia  - no acute findings on CT scan of the head  - MRI brain was concerning for either hypoxic vs CJD etiology. follow up MRI 12/18 showed improvement and Neurology feels changes were due to seizures.  -EEG reported abnormal with moderately severe continuous generalized nonspecific slowing of cerebral activity. No evidence of an epileptic disorder was demonstrated.  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 Check HSV and enterovirus PCR.  -SLP recommended D1 diet with thin liquids/supervision  -mental status much better today.   acute seizure  on 12/23 patient was noted by  neurologist to be poorly responsive with a lateral gaze.. She appeared to be having active seizure. Ordered 1 gm IV keppra and placed on 1000 mg oral keppra bid which she should continue. Ativan IV prn for seizures  -on 12/24 patient  again  noted to be lethargic. EEG shows encephalopathic changes. Could be related to keppra. -Continue neuro checks   Acute renal failure  -Likely prerenal with poor po intake  -Resolved with hydration   cdiff  Started on empiric flagyl.diarrhea resolved.   Hypernatremia  - improved with 1/2 NS   Tachycardia  -Stable after BB resumed.   UTI / pyelonephritis  -culture negative. Completed rocephin ( received for almost 7 days)   Hx of catatonia  -Son reported two prior episodes but states they have been quite infrequent  - does not appear to be followed chronically by Psychiatry or Neurology .   Hypotension on 12/24  asymptmoatic. BP stable   Code Status: FULL  Family Communication: sone marcus at bedside Disposition Plan: to SNF tomorrow if stable  Consultants:  Neurology   Procedures  LP   Antibiotics:  Rocephin 12/13 >> 12/20  Flagyl (12/20>>)   DVT prophylaxis:  SCDs  HPI/Subjective:  Patient much awake and oriented today. Speech clear. Son at bedside       Objective: Filed Vitals:   05/23/13 1046  BP: 129/63  Pulse: 115  Temp: 99.1 F (37.3 C)  Resp: 20   No intake or output data in the 24 hours ending 05/23/13 1243 Filed Weights   05/11/13 2205 05/17/13 1520  Weight:  70.1 kg (154 lb 8.7 oz) 70.2 kg (154 lb 12.2 oz)    Exam:  General: Middle aged female in NAD HEENT: no pallor, moist mucosa  chest: clear b/l, no added sounds  CVS: NS1&S2, no murmurs, rubs or gallop  Abd: soft, NT, ND, BS+' Ext: warm, no edema  CNS:  AAOX1-2 , clearer speech ,weakness over rt side   Data Reviewed: Basic Metabolic Panel:  Recent Labs Lab 05/18/13 0520 05/21/13 0617  NA 137 139  K 3.0* 3.2*  CL 103 104  CO2 21 20   GLUCOSE 76 81  BUN 5* <3*  CREATININE 0.65 0.59  CALCIUM 7.9* 8.1*   Liver Function Tests: No results found for this basename: AST, ALT, ALKPHOS, BILITOT, PROT, ALBUMIN,  in the last 168 hours No results found for this basename: LIPASE, AMYLASE,  in the last 168 hours No results found for this basename: AMMONIA,  in the last 168 hours CBC:  Recent Labs Lab 05/18/13 0520 05/21/13 0617  WBC 5.3 4.8  HGB 8.9* 9.2*  HCT 26.1* 27.2*  MCV 88.5 89.2  PLT 82* 183   Cardiac Enzymes: No results found for this basename: CKTOTAL, CKMB, CKMBINDEX, TROPONINI,  in the last 168 hours BNP (last 3 results) No results found for this basename: PROBNP,  in the last 8760 hours CBG: No results found for this basename: GLUCAP,  in the last 168 hours  Recent Results (from the past 240 hour(s))  CLOSTRIDIUM DIFFICILE BY PCR     Status: Abnormal   Collection Time    05/18/13 10:26 AM      Result Value Range Status   C difficile by pcr POSITIVE (*) NEGATIVE Final   Comment: CRITICAL RESULT CALLED TO, READ BACK BY AND VERIFIED WITH:     MINOR A,RN 1600 05/18/13 SCALES H     Studies: No results found.  Scheduled Meds: . antiseptic oral rinse  15 mL Mouth Rinse q12n4p  . atenolol  12.5 mg Oral Daily  . chlorhexidine  15 mL Mouth Rinse BID  . feeding supplement (RESOURCE BREEZE)  1 Container Oral BID BM  . folic acid  1 mg Oral Daily  . influenza vac split quadrivalent PF  0.5 mL Intramuscular Tomorrow-1000  . levETIRAcetam  1,000 mg Oral BID  . LORazepam  1 mg Intravenous Once  . metroNIDAZOLE  500 mg Oral Q8H  . pantoprazole  40 mg Oral BID  . potassium chloride  40 mEq Oral Once  . thiamine  100 mg Oral Daily  . valproate sodium  500 mg Intravenous Q12H   Continuous Infusions:     Time spent: 25 minutes    Germani Gavilanes  [facility] Pager [phone] If 7PM-7AM, please contact night-coverage at [email or web address], password [facility] 05/23/2013, 12:43 PM  LOS: 12 days

## 2013-05-23 NOTE — Progress Notes (Signed)
Subjective: More awake than yesterday.   Her son is at bedside and is able to give me more history. She has had 3 episodes similar to the current one, all of which resolved with Ativan. She was diagnosed with catatonia. The previous 2 episodes happened following surgical procedures. They were both much shorter lived in the current episode. She has no cognitive decline in between these episodes.  Exam: Filed Vitals:   05/23/13 0544  BP: 124/47  Pulse: 110  Temp: 98.7 F (37.1 C)  Resp: 20   Gen: In bed, NAD MS: Awake, Alert, able to idenitfy son in the room. Able to tell me that she is in [place] at a hospital, but not [facility]. She still has significant confusion and mild dysinhibition.  ZO:XWRUE, VFF Motor: moves all extremities, though not clear she is giving full effort with strength testing.  Sensory:endorses symmetric sensation.   Impression: 52 yo F with AMS. 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 The lack of cognitive decline prior to this seizure would argue against autoimmune encephalitis. She seems to have responded to increasing her antiepileptic therapy, though she continues to have intermittent spells then EEG monitoring may be indicated.  Recommendations: 1) enterovirus, HSV by PCR on initial CSF pending 2) tau, 1433 pending, though positive would more likely represent a different destructive process such as encephalitis rather than Creutzfeldt-Jakob disease given that her history does not sound consistent with this diagnosis.  [name], MD [facility] [phone]  If 7pm- 7am, please page neurology on call at [phone].

## 2013-05-23 NOTE — Progress Notes (Signed)
CSW  Spoke with patient's sonDoylene [name] [phone] to discuss bed offers and d/c plan. Son requests placement at [facility] and per MD- patient is not yet stable for d/c- hopefully ready Friday 05/24/13 per MD.  CSW will continue to monitor for date of stability per DM.  Lorri Frederick. [place]  [phone]

## 2013-05-24 ENCOUNTER — Inpatient Hospital Stay (HOSPITAL_COMMUNITY): Payer: Medicare Other

## 2013-05-24 MED ORDER — ENOXAPARIN SODIUM 40 MG/0.4ML ~~LOC~~ SOLN
40.0000 mg | SUBCUTANEOUS | Status: DC
  Administered 2013-05-24 – 2013-05-27 (×4): 40 mg via SUBCUTANEOUS
  Filled 2013-05-24 (×4): qty 0.4

## 2013-05-24 NOTE — Progress Notes (Addendum)
Subjective: More awake than yesterday.   Her son is at bedside and is able to give me more history. She has had 3 episodes similar to the current one, all of which resolved with Ativan. She was diagnosed with catatonia. The previous 2 episodes happened following surgical procedures. They were both much shorter lived in the current episode. She has no cognitive decline in between these episodes.  Exam: Filed Vitals:   05/24/13 0952  BP: 126/60  Pulse: 99  Temp: 98.7 F (37.1 C)  Resp: 20   Gen: In bed, NAD MS: Awake, Alert, not able to tell me which son visited yesterday.   ZO:XWRUE, VFF Motor: moves all extremities, though not clear she is giving full effort with strength testing.  Sensory:endorses symmetric sensation.   Impression: 52 yo F with AMS. 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 The lack of cognitive decline prior to this seizure would argue against autoimmune encephalitis. She seems to have responded to increasing her antiepileptic therapy. She continues to be confused, which I suspect isa result of prolonged NCSE, but will do prolonged EEG to rule out subclinical seizures.   Recommendations: 1) enterovirus, HSV by PCR on initial CSF pending 2) tau, 1433 pending, though positive would more likely represent a different destructive process such as encephalitis rather than Creutzfeldt-Jakob disease given that her history does not sound consistent with this diagnosis. 3) 24 hour Continuous EEG  [name], MD [facility] [phone]  If 7pm- 7am, please page neurology on call at [phone].

## 2013-05-24 NOTE — Progress Notes (Signed)
UR complete.  Chosen Geske RN, MSN 

## 2013-05-24 NOTE — Progress Notes (Signed)
Per Dr. Gonzella Lex, patient is not yet stable for d/c.  Will require monitoring over the weekend. CSW notified patient's son and Jasmine December at [facility]. CSW will re-evaluate on Monday 05/27/13. Lorri Frederick. [place]  [phone]

## 2013-05-24 NOTE — Progress Notes (Signed)
[facility] PROGRESS NOTE  Joan [name] ZOX:[id number] DOB: May 01, 1961 DOA: 05/11/2013 PCP: No primary provider on file.  Brief narrative:  52 y.o. female who was brought to the ED after having decreased responsiveness for 2-3 days. She was unresponsive at presentation and her son gave the history. 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 He stated that she has not had any food or liquids for the last 203 days. She was diagnosed with a UTI on 04/05/2013 and was prescribed Bactrim therapy but she did not take the medication due to increased nausea and vomiting. Her son also reported that prior to this she had been falling down a lot and had sustained multiple bruises from her falls.  In the ED, she was evaluated and found to be minimally responsive and febrile. She was found to have a UTI, and and increased lactic acid level of 5. She was placed on IV Rocephin and IVFs for rehydration. A CT scan of the head was performed and was found to be negative for acute findings.  Patient admitted to stepdown. She underwent MRI brain and LP. transferred to medical floor for further care.   Assessment/Plan:  Toxic metabolic encephalopathy  -Initially felt to be Combination of sepsis from pyelonephritis and possible psychiatric issues/catatonia  - no acute findings on CT scan of the head  - MRI brain was concerning for either hypoxic vs CJD etiology. follow up MRI 12/18 showed improvement and Neurology feels changes were due to seizures.  -EEG reported abnormal with moderately severe continuous generalized nonspecific slowing of cerebral activity. No evidence of an epileptic disorder was demonstrated.  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 Check HSV and enterovirus PCR.  -SLP recommended D1 diet with thin liquids/supervision  -mental status improving  acute seizure  on 12/23 patient was noted by neurologist to be  poorly responsive with a lateral gaze.. She appeared to be having active seizure. Ordered 1 gm IV keppra and placed on 1000 mg oral keppra bid which she should continue. Ativan IV prn for seizures  -on 12/24 patient again noted to be lethargic. EEG shows encephalopathic changes. Could be related to keppra.  Continue Keppra. Added Depakote on 12/24. -Continue neuro checks . No seizure activity witnessed for past 48 hours. -Neurology recommends obtaining 24 hour continuous EEG monitor for any recurrent seizures. - Acute renal failure  -Likely prerenal with poor po intake  -Resolved with hydration   cdiff  on empiric flagyl for 2 weeks course..diarrhea resolved.   Hypernatremia  - improved with 1/2 NS   Tachycardia  -Stable after BB resumed.   UTI / pyelonephritis  -culture negative. Completed rocephin ( received for almost 7 days)   Hx of catatonia  -Son reported two prior episodes but states they have been quite infrequent  - does not appear to be followed chronically by Psychiatry or Neurology .   Hypotension on 12/24  asymptmoatic. BP stable After IV hydration given  Communication: d/w son marcus on 12/25 Disposition Plan: held  discharged to SNF until early next week given plan for continuous EEG monitoring  Consultants:  Neurology   Procedures  LP   Antibiotics:  Rocephin 12/13 >> 12/20  Flagyl (12/20>>)   DVT prophylaxis:  Sq lovenox   HPI/Subjective: No overnight issues. Patient appears more alert with clear speech.  Objective: Filed Vitals:   05/24/13 0952  BP: 126/60  Pulse: 99  Temp: 98.7  F (37.1 C)  Resp: 20    Intake/Output Summary (Last 24 hours) at 05/24/13 1513 Last data filed at 05/24/13 0700  Gross per 24 hour  Intake     60 ml  Output      0 ml  Net     60 ml   Filed Weights   05/11/13 2205 05/17/13 1520  Weight: 70.1 kg (154 lb 8.7 oz) 70.2 kg (154 lb 12.2 oz)    Exam:  General: Middle aged female in NAD  HEENT: no pallor,  moist mucosa  chest: clear b/l, no added sounds  CVS: NS1&S2, no murmurs, rubs or gallop  Abd: soft, NT, ND, BS+' Ext: warm, no edema  CNS:  AAOX1-2 , clearer speech ,weakness over rt side  Data Reviewed: Basic Metabolic Panel:  Recent Labs Lab 05/18/13 0520 05/21/13 0617  NA 137 139  K 3.0* 3.2*  CL 103 104  CO2 21 20  GLUCOSE 76 81  BUN 5* <3*  CREATININE 0.65 0.59  CALCIUM 7.9* 8.1*   Liver Function Tests: No results found for this basename: AST, ALT, ALKPHOS, BILITOT, PROT, ALBUMIN,  in the last 168 hours No results found for this basename: LIPASE, AMYLASE,  in the last 168 hours No results found for this basename: AMMONIA,  in the last 168 hours CBC:  Recent Labs Lab 05/18/13 0520 05/21/13 0617  WBC 5.3 4.8  HGB 8.9* 9.2*  HCT 26.1* 27.2*  MCV 88.5 89.2  PLT 82* 183   Cardiac Enzymes: No results found for this basename: CKTOTAL, CKMB, CKMBINDEX, TROPONINI,  in the last 168 hours BNP (last 3 results) No results found for this basename: PROBNP,  in the last 8760 hours CBG: No results found for this basename: GLUCAP,  in the last 168 hours  Recent Results (from the past 240 hour(s))  CLOSTRIDIUM DIFFICILE BY PCR     Status: Abnormal   Collection Time    05/18/13 10:26 AM      Result Value Range Status   C difficile by pcr POSITIVE (*) NEGATIVE Final   Comment: CRITICAL RESULT CALLED TO, READ BACK BY AND VERIFIED WITH:     MINOR A,RN 1600 05/18/13 SCALES H     Studies: No results found.  Scheduled Meds: . antiseptic oral rinse  15 mL Mouth Rinse q12n4p  . atenolol  12.5 mg Oral Daily  . chlorhexidine  15 mL Mouth Rinse BID  . feeding supplement (RESOURCE BREEZE)  1 Container Oral BID BM  . folic acid  1 mg Oral Daily  . influenza vac split quadrivalent PF  0.5 mL Intramuscular Tomorrow-1000  . levETIRAcetam  1,000 mg Oral BID  . LORazepam  1 mg Intravenous Once  . metroNIDAZOLE  500 mg Oral Q8H  . pantoprazole  40 mg Oral BID  . potassium  chloride  40 mEq Oral Once  . thiamine  100 mg Oral Daily  . valproate sodium  500 mg Intravenous Q12H   Continuous Infusions: . sodium chloride 100 mL/hr at 05/23/13 1319      Time spent:25 MINUTES    Eddie North  [facility] Pager [phone] If 7PM-7AM, please contact night-coverage at [email or web address], password [facility] 05/24/2013, 3:13 PM  LOS: 13 days

## 2013-05-24 NOTE — Progress Notes (Signed)
LTM EEG started 

## 2013-05-24 NOTE — Progress Notes (Signed)
Physical Therapy Treatment Patient Details Name: Joan [name] MRN: [id number] DOB: [birth year] Today's Date: 05/24/2013 Time: 4098-1191 PT Time Calculation (min): 25 min  PT Assessment / Plan / Recommendation  History of Present Illness Joan [name] is a 52 y.o. female who was brought to the ED after having decreased responsiveness for the past 2-3 days.  She is unresponsive at this time and her son gives the history.  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  He states that she has not had any food or liquids during this time as well.   She was diagnosed with a UTI on 04/05/2013 and was prescribed Bactrim therapy but she did not take the medication due to increased nausea and vomiting.   Her son also reports that prior to this she had been falling down a lot and has multiple bruises from her falls.    PT Comments   Pt has little control of her trunk or extremities.  Needs maximal assist at best to transition in bed, two persons to assist with standing.  Sitting balance is improving, but uncoordinated with inability to hold static position in midline for more than a few secs.  She reports feeling drunk.  Will need SNF for rehab  Follow Up Recommendations  SNF     Does the patient have the potential to tolerate intense rehabilitation     Barriers to Discharge        Equipment Recommendations  Other (comment) (TBA at next venue)    Recommendations for Other Services    Frequency Min 3X/week   Progress towards PT Goals Progress towards PT goals: Progressing toward goals (though very slowly)  Plan Current plan remains appropriate    Precautions / Restrictions Precautions Precautions: Fall Restrictions Weight Bearing Restrictions: No   Pertinent Vitals/Pain     Mobility  Bed Mobility Bed Mobility: Rolling Left;Left Sidelying to Sit;Sit to Supine Rolling Left: 3: Mod assist Left Sidelying to Sit: 2: Max assist Sit to Supine: 1: +2  Total assist Sit to Supine: Patient Percentage: 10% Details for Bed Mobility Assistance: assisted legs off bed and trunk upright; Patient with limited assistance to help to edge of bed. Unable to grab for rail Transfers Transfers: Sit to Stand;Stand to Sit Sit to Stand: 1: +2 Total assist;From bed Sit to Stand: Patient Percentage: 20% Stand to Sit: 1: +2 Total assist;To chair/3-in-1 Stand to Sit: Patient Percentage: 10% Details for Transfer Assistance: truncal support and vc's needed for prep to stand.    Exercises     PT Diagnosis:    PT Problem List:   PT Treatment Interventions:     PT Goals (current goals can now be found in the care plan section) Acute Rehab PT Goals PT Goal Formulation: With patient Time For Goal Achievement: 05/30/13 Potential to Achieve Goals: Good  Visit Information  Last PT Received On: 05/24/13 Assistance Needed: +2 History of Present Illness: Joan [name] is a 52 y.o. female who was brought to the ED after having decreased responsiveness for the past 2-3 days.  She is unresponsive at this time and her son gives the history.  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  He states that she has not had any food or liquids during this time as well.   She was diagnosed with a UTI on 04/05/2013 and was prescribed Bactrim therapy but she did not take the medication  due to increased nausea and vomiting.   Her son also reports that prior to this she had been falling down a lot and has multiple bruises from her falls.     Subjective Data  Subjective: Why did you make me feel so drunk?   Cognition  Cognition Arousal/Alertness: Awake/alert Overall Cognitive Status: Impaired/Different from baseline Current Attention Level: Focused Following Commands: Follows one step commands inconsistently;Follows one step commands with increased time Problem Solving: Slow processing;Decreased initiation;Requires verbal cues;Requires  tactile cues    Balance  Balance Balance Assessed: Yes Static Sitting Balance Static Sitting - Balance Support: Feet supported;Right upper extremity supported;Left upper extremity supported;Bilateral upper extremity supported Static Sitting - Level of Assistance: 3: Mod assist;5: Stand by assistance Static Sitting - Comment/# of Minutes: sat EOB approx 14 minutes working on sitting in midline, pulling back into midline with graded support and with/without use of UE's  Pt needed more assist outside of BOS with assist to regain midline.  She held midline with min guard for secs only before starting to list out of midline.  End of Session PT - End of Session Equipment Utilized During Treatment: Gait belt Activity Tolerance: Patient limited by fatigue Patient left: in bed;with call bell/phone within reach Nurse Communication: Mobility status   GP     Joan [name], Eliseo Gum 05/24/2013, 5:40 PM 05/24/2013  Trenton Bing, PT [phone]  (pager)

## 2013-05-25 DIAGNOSIS — E876 Hypokalemia: Secondary | ICD-10-CM | POA: Diagnosis not present

## 2013-05-25 LAB — BASIC METABOLIC PANEL
BUN: <3 mg/dL — ABNORMAL LOW (ref 6–23)
CO2: 23 mEq/L (ref 19–32)
Chloride: 107 mEq/L (ref 96–112)
GFR calc Af Amer: >90 mL/min (ref >90)
GFR calc non Af Amer: >90 mL/min (ref >90)
Glucose, Bld: 82 mg/dL (ref 70–99)
Potassium: 2.6 mEq/L — CL (ref 3.5–5.1)
Sodium: 141 mEq/L (ref 135–145)

## 2013-05-25 LAB — HSV PCR
HSV 2 , PCR: NOT DETECTED
HSV, PCR: NOT DETECTED

## 2013-05-25 MED ORDER — MAGNESIUM SULFATE 40 MG/ML IJ SOLN
2.0000 g | Freq: Once | INTRAMUSCULAR | Status: AC
  Administered 2013-05-25: 2 g via INTRAVENOUS
  Filled 2013-05-25: qty 50

## 2013-05-25 MED ORDER — POTASSIUM CHLORIDE CRYS ER 20 MEQ PO TBCR
40.0000 meq | EXTENDED_RELEASE_TABLET | ORAL | Status: AC
  Administered 2013-05-25 (×2): 40 meq via ORAL
  Filled 2013-05-25 (×2): qty 2

## 2013-05-25 NOTE — Progress Notes (Signed)
[facility] PROGRESS NOTE  Joan [name] WUJ:[id number] DOB: Apr 21, 1961 DOA: 05/11/2013 PCP: No primary provider on file.  Brief narrative:  52 y.o. female who was brought to the ED after having decreased responsiveness for 2-3 days. She was unresponsive at presentation and her son gave the history. 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 He stated that she has not had any food or liquids for the last 203 days. She was diagnosed with a UTI on 04/05/2013 and was prescribed Bactrim therapy but she did not take the medication due to increased nausea and vomiting. Her son also reported that prior to this she had been falling down a lot and had sustained multiple bruises from her falls.  In the ED, she was evaluated and found to be minimally responsive and febrile. She was found to have a UTI, and and increased lactic acid level of 5. She was placed on IV Rocephin and IVFs for rehydration. A CT scan of the head was performed and was found to be negative for acute findings.  Patient admitted to stepdown. She underwent MRI brain and LP. transferred to medical floor for further care.   Assessment/Plan:  Toxic metabolic encephalopathy  -Initially felt to be Combination of sepsis from pyelonephritis and possible psychiatric issues/catatonia  - no acute findings on CT scan of the head  - MRI brain was concerning for either hypoxic vs CJD etiology. follow up MRI 12/18 showed improvement and Neurology feels changes were due to seizures.  -EEG reported abnormal with moderately severe continuous generalized nonspecific slowing of cerebral activity. No evidence of an epileptic disorder was demonstrated.  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 Check HSV and enterovirus PCR.  -SLP recommended D1 diet with thin liquids/supervision  -mental status improving   acute seizure  on 12/23 patient was noted by neurologist to be  poorly responsive with a lateral gaze.. She appeared to be having active seizure. Ordered 1 gm IV keppra and placed on 1000 mg oral keppra bid which she should continue. Ativan IV prn for seizures  -on 12/24 patient again noted to be lethargic. EEG shows encephalopathic changes. Could be related to keppra.  Continue Keppra. Added Depakote on 12/24.  -Continue neuro checks . No seizure activity witnessed for past 72 hours.  - 24 hour continuous EEG monitor for any recurrent seizures completed. Results pending  Hypokalemia  Replenish with by mouth KCl. Low Magnesium replenished  Acute renal failure  -Likely prerenal with poor po intake  -Resolved with hydration    cdiff  on empiric flagyl for 2 weeks course..diarrhea resolved.   Hypernatremia  - improved with 1/2 NS   Tachycardia  -Stable after BB resumed.   UTI / pyelonephritis  -culture negative. Completed rocephin ( received for almost 7 days)   Hx of catatonia  -Son reported two prior episodes but states they have been quite infrequent  - does not appear to be followed chronically by Psychiatry or Neurology .    Communication: d/w son marcus on 12/25  Disposition Plan: SNF likely on 12/29  Consultants:  Neurology   Procedures  LP  Antibiotics:  Rocephin 12/13 >> 12/20  Flagyl (12/20>>)   DVT prophylaxis:  Sq lovenox   HPI/Subjective:  Patient seen and examined. She is sleepy but answering questions more appropriately and clearly. She is oriented to place and person  Objective: Filed Vitals:   05/25/13 0522  BP: 142/89  Pulse: 108  Temp: 99 F (37.2 C)  Resp: 20    Intake/Output Summary (Last 24 hours) at 05/25/13 1433 Last data filed at 05/25/13 0700  Gross per 24 hour  Intake    150 ml  Output      0 ml  Net    150 ml   Filed Weights   05/11/13 2205 05/17/13 1520  Weight: 70.1 kg (154 lb 8.7 oz) 70.2 kg (154 lb 12.2 oz)    Exam:  General: Middle aged female in NAD  HEENT: no pallor, moist  mucosa  chest: clear b/l, no added sounds  CVS: NS1&S2, no murmurs, rubs or gallop  Abd: soft, NT, ND, BS+' Ext: warm, no edema  CNS:  AAOX2 , clearer speech ,weakness over rt side   Data Reviewed: Basic Metabolic Panel:  Recent Labs Lab 05/21/13 0617 05/25/13 0710  NA 139 141  K 3.2* 2.6*  CL 104 107  CO2 20 23  GLUCOSE 81 82  BUN <3* <3*  CREATININE 0.59 0.61  CALCIUM 8.1* 7.8*  MG  --  1.4*   Liver Function Tests: No results found for this basename: AST, ALT, ALKPHOS, BILITOT, PROT, ALBUMIN,  in the last 168 hours No results found for this basename: LIPASE, AMYLASE,  in the last 168 hours No results found for this basename: AMMONIA,  in the last 168 hours CBC:  Recent Labs Lab 05/21/13 0617  WBC 4.8  HGB 9.2*  HCT 27.2*  MCV 89.2  PLT 183   Cardiac Enzymes: No results found for this basename: CKTOTAL, CKMB, CKMBINDEX, TROPONINI,  in the last 168 hours BNP (last 3 results) No results found for this basename: PROBNP,  in the last 8760 hours CBG: No results found for this basename: GLUCAP,  in the last 168 hours  Recent Results (from the past 240 hour(s))  CLOSTRIDIUM DIFFICILE BY PCR     Status: Abnormal   Collection Time    05/18/13 10:26 AM      Result Value Range Status   C difficile by pcr POSITIVE (*) NEGATIVE Final   Comment: CRITICAL RESULT CALLED TO, READ BACK BY AND VERIFIED WITH:     MINOR A,RN 1600 05/18/13 SCALES H     Studies: No results found.  Scheduled Meds: . antiseptic oral rinse  15 mL Mouth Rinse q12n4p  . atenolol  12.5 mg Oral Daily  . chlorhexidine  15 mL Mouth Rinse BID  . enoxaparin (LOVENOX) injection  40 mg Subcutaneous Q24H  . feeding supplement (RESOURCE BREEZE)  1 Container Oral BID BM  . folic acid  1 mg Oral Daily  . influenza vac split quadrivalent PF  0.5 mL Intramuscular Tomorrow-1000  . levETIRAcetam  1,000 mg Oral BID  . LORazepam  1 mg Intravenous Once  . metroNIDAZOLE  500 mg Oral Q8H  . pantoprazole  40 mg  Oral BID  . potassium chloride  40 mEq Oral Once  . thiamine  100 mg Oral Daily  . valproate sodium  500 mg Intravenous Q12H   Continuous Infusions: . sodium chloride 100 mL/hr at 05/25/13 1350      Time spent: 25 minutes    Joleena Weisenburger  [facility] Pager [phone]. If 7PM-7AM, please contact night-coverage at [email or web address], password [facility] 05/25/2013, 2:33 PM  LOS: 14 days

## 2013-05-25 NOTE — Procedures (Signed)
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  This continous EEG recorded from 12/26 11:42am until 12/26 1pm and 4:05pm until 12/27 12:15 AM  Sedation: None  Background: There are bursts of frontally predominant intermittent rhythmic delta activity (firda) throughout the study. Throughout the study there are significant amounts of generalized regular delta activity. The posterior dominant rhythm is moderately organized and that she use a maximal frequency of 8 Hz. Briefly there are episodes of sleep structures that are seen, though the vast majority of the study was done in the waking state.  Photic stimulation: Physiologic driving is performed  EEG Abnormalities: 1) FIRDA 2) generalized irregular delta activity  Clinical Interpretation: This EEG is consistent with mild to moderate generalized non-specific cerebral dysfunction(encephalopathy). There was no seizure or seizure predisposition recorded on this study.   [name], MD [facility] [phone]  If 7pm- 7am, please page neurology on call at [phone].

## 2013-05-25 NOTE — Progress Notes (Signed)
RN notified by lab that K was 2.6. MD notified. Orders entered.

## 2013-05-25 NOTE — Progress Notes (Signed)
LTVM EEG discontinued per Dr. Kirkpatrick. 

## 2013-05-25 NOTE — Progress Notes (Signed)
Subjective: No significant events overnight.   Exam: Filed Vitals:   05/25/13 1800  BP: 135/63  Pulse: 98  Temp: 98.1 F (36.7 C)  Resp: 20   Gen: In bed, NAD MS: Awake, Alert, answers questions and follows commands.   QM:VHQIO, VFF Motor: moves all extremities, though not clear she is giving full effort with strength testing.  Sensory:endorses symmetric sensation.   Impression: 52 yo F with AMS. 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 The lack of cognitive decline prior to this seizure would argue against autoimmune encephalitis. She seems to have responded to increasing her antiepileptic therapy. She continues to be confused, which I suspect isa result of prolonged NCSE, but will do prolonged EEG to rule out subclinical seizures.   Recommendations: 1) enterovirus, HSV by PCR on initial CSF pending 2) tau, 1433 pending, though positive would more likely represent a different destructive process such as encephalitis rather than Creutzfeldt-Jakob disease given that her history does not sound consistent with this diagnosis. 3) 24 hour Continuous EEG read pending  [name], MD [facility] [phone]  If 7pm- 7am, please page neurology on call at [phone].

## 2013-05-26 LAB — BASIC METABOLIC PANEL
BUN: <3 mg/dL — ABNORMAL LOW (ref 6–23)
Calcium: 7.8 mg/dL — ABNORMAL LOW (ref 8.4–10.5)
Chloride: 108 mEq/L (ref 96–112)
Creatinine, Ser: 0.57 mg/dL (ref 0.50–1.10)
GFR calc Af Amer: >90 mL/min (ref >90)
GFR calc non Af Amer: >90 mL/min (ref >90)
Glucose, Bld: 80 mg/dL (ref 70–99)
Potassium: 3.3 mEq/L — ABNORMAL LOW (ref 3.5–5.1)

## 2013-05-26 LAB — ENTEROVIRUS PCR: Enterovirus PCR: NOT DETECTED

## 2013-05-26 MED ORDER — VALPROIC ACID 250 MG/5ML PO SYRP: 500.0000 mg | ORAL_SOLUTION | Freq: Two times a day (BID) | ORAL | Status: DC

## 2013-05-26 MED ORDER — THIAMINE HCL 100 MG PO TABS: 100.0000 mg | ORAL_TABLET | Freq: Every day | ORAL | Status: DC

## 2013-05-26 MED ORDER — LEVETIRACETAM 1000 MG PO TABS: 1000.0000 mg | ORAL_TABLET | Freq: Two times a day (BID) | ORAL | Status: DC

## 2013-05-26 MED ORDER — CHLORHEXIDINE GLUCONATE 0.12 % MT SOLN: 15.0000 mL | Freq: Two times a day (BID) | OROMUCOSAL | Status: DC

## 2013-05-26 MED ORDER — POTASSIUM CHLORIDE CRYS ER 20 MEQ PO TBCR
40.0000 meq | EXTENDED_RELEASE_TABLET | Freq: Once | ORAL | Status: AC
  Administered 2013-05-26: 40 meq via ORAL
  Filled 2013-05-26: qty 2

## 2013-05-26 MED ORDER — ATENOLOL 12.5 MG HALF TABLET: 12.5000 mg | ORAL_TABLET | Freq: Every day | ORAL | Status: DC

## 2013-05-26 MED ORDER — BOOST / RESOURCE BREEZE PO LIQD: 1.0000 | Freq: Two times a day (BID) | ORAL | Status: DC

## 2013-05-26 MED ORDER — METRONIDAZOLE 500 MG PO TABS: 500.0000 mg | ORAL_TABLET | Freq: Three times a day (TID) | ORAL | Status: AC

## 2013-05-26 MED ORDER — FOLIC ACID 1 MG PO TABS: 1.0000 mg | ORAL_TABLET | Freq: Every day | ORAL | Status: DC

## 2013-05-26 NOTE — Progress Notes (Signed)
Subjective: Continues to be confused.   Exam: Filed Vitals:   05/26/13 0536  BP: 132/76  Pulse: 96  Temp: 98 F (36.7 C)  Resp: 20   Gen: In bed, NAD MS: Awake, Alert, answers questions and follows commands.  Oriented to hospital, but not which one.  ZO:XWRUE, VFF Motor: moves all extremities, though not clear she is giving full effort with strength testing.  Sensory:endorses symmetric sensation.   Impression: 52 yo F with AMS. 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 The lack of cognitive decline prior to this seizure would argue against autoimmune encephalitis. She seems to have responded to increasing her antiepileptic therapy. She continues to be confused, which I suspect isa result of prolonged NCSE, but will do prolonged EEG to rule out subclinical seizures.   Recommendations: 1) HSV by PCR on initial CSF pending 2) tau, 1433 pending, though positive would more likely represent a different destructive process such as encephalitis rather than Creutzfeldt-Jakob disease given that her history does not sound consistent with this diagnosis. 3) VPA level this morning  [name], MD [facility] [phone]  If 7pm- 7am, please page neurology on call at [phone].

## 2013-05-26 NOTE — Discharge Summary (Addendum)
Physician Discharge Summary  Debbra Digiulio ZOX:[id number] DOB: 01/23/1961 DOA: 05/11/2013  PCP: No primary provider on file.  Admit date: 05/11/2013 Discharge date: 05/27/2013  Time spent: 40 minutes  Recommendations for Outpatient Follow-up:  1. D/C to SNF. Follow up  with [facility] in 1 month 2. Depakote level on Friday 3. D/c flagyl 1/2- c diff neg on 12/29 4. Cbc, bmp 1 wek  Discharge Diagnoses:  Principal Problem:   Acute encephalopathy  Active Problems:   Sepsis   Convulsions/seizures   UTI (lower urinary tract infection)   Clostridial gastroenteritis   Dehydration   Hypernatremia   Metabolic acidosis   AKI (acute kidney injury)   Hypokalemia   Hypomagnesemia   Discharge Condition: fair  Diet recommendation: dys level 1   Filed Weights   05/11/13 2205 05/17/13 1520  Weight: 70.1 kg (154 lb 8.7 oz) 70.2 kg (154 lb 12.2 oz)    History of present illness:  Please refer  to admission H&P for details but in brief  52 y.o. female who was brought to the ED after having decreased responsiveness for 2-3 days. She was unresponsive at presentation and her son gave the history. 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 He stated that she has not had any food or liquids for the last 203 days. She was diagnosed with a UTI on 04/05/2013 and was prescribed Bactrim therapy but she did not take the medication due to increased nausea and vomiting. Her son also reported that prior to this she had been falling down a lot and had sustained multiple bruises from her falls.  In the ED, she was evaluated and found to be minimally responsive and febrile. She was found to have a UTI, and and increased lactic acid level of 5. She was placed on IV Rocephin and IVFs for rehydration. A CT scan of the head was performed and was found to be negative for acute findings.  Patient admitted to stepdown. She underwent MRI brain and LP.  transferred to medical floor for further care.    Hospital Course:  Toxic metabolic encephalopathy  -Initially felt to be Combination of sepsis from pyelonephritis and possible psychiatric issues/catatonia  - no acute findings on CT scan of the head  - MRI brain was concerning for either hypoxic vs CJD etiology. follow up MRI 12/18 showed improvement and Neurology feels changes were due to seizures.  -EEG reported abnormal with moderately severe continuous generalized nonspecific slowing of cerebral activity. No evidence of an epileptic disorder was demonstrated.  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 Check HSV and enterovirus PCR.  -SLP recommended D1 diet with thin liquids/supervision  -mental status slowly improving   acute seizure  on 12/23 patient was noted by neurologist to be poorly responsive with a lateral gaze.. She appeared to be having active seizure. Ordered 1 gm IV keppra and placed on 1000 mg oral keppra bid which she should continue. Ativan IV prn for seizures  Continue Keppra. Added Depakote on 12/24.  -Continue neuro checks . No seizure activity witnessed for past 4 days - 24 hour continuous EEG monitor for any recurrent seizures done and was negative . Showed mild to moderate generalized non-specific cerebral dysfunction(encephalopathy).   cdiff enteritis Being treated with 2 weeks course of flagyl ( until 05/31/13). Improving but started having persistent diarrhea since yesterday. flexiseal ordered. we'll monitor. D/c protonix.  Hypokalemia  Replenish with by  mouth KCl. Low Magnesium replenished   Acute renal failure  -Likely prerenal with poor po intake  -Resolved with hydration    Hypernatremia  - improved with fluids( 1/2 NS)  Tachycardia  -Stable after BB resumed.   UTI / pyelonephritis  -culture negative. Completed rocephin ( received for almost 7 days)   Hx of catatonia  -Son reported two prior episodes but  states they have been quite infrequent  - does not appear to be followed chronically by Psychiatry or Neurology .   Communication: d/w son [name] updated on the phone on 12/28 Disposition Plan: SNF likely on 12/29 if diarrhea iproves  Consultants:  Neurology   Procedures  LP   Antibiotics:  Rocephin 12/13 >> 12/20  Flagyl (12/20>>)       Discharge Exam: Filed Vitals:   05/27/13 0956  BP: 119/67  Pulse: 124  Temp: 98.9 F (37.2 C)  Resp: 18    General: Middle aged female in NAD  HEENT: no pallor, moist mucosa  chest: clear b/l, no added sounds  CVS: NS1&S2, no murmurs, rubs or gallop  Abd: soft, NT, ND, BS+' Ext: warm, no edema  CNS:  AAOX2 , clearer speech ,weakness over rt side   Discharge Instructions      Discharge Orders   Future Orders Complete By Expires   Discharge instructions  As directed    Comments:     depakote level on Friday       Medication List    STOP taking these medications       ALPRAZolam 0.5 MG tablet  Commonly known as:  XANAX     amitriptyline 50 MG tablet  Commonly known as:  ELAVIL     buPROPion 300 MG 24 hr tablet  Commonly known as:  WELLBUTRIN XL     clonazePAM 1 MG tablet  Commonly known as:  KLONOPIN     diphenhydrAMINE 25 mg capsule  Commonly known as:  BENADRYL     doxylamine (Sleep) 25 MG tablet  Commonly known as:  UNISOM     hydrocortisone 2.5 % ointment     loperamide 2 MG capsule  Commonly known as:  IMODIUM     oxyCODONE-acetaminophen 5-325 MG per tablet  Commonly known as:  PERCOCET/ROXICET     pantoprazole 40 MG tablet  Commonly known as:  PROTONIX     promethazine 25 MG tablet  Commonly known as:  PHENERGAN      TAKE these medications       atenolol 12.5 mg Tabs tablet  Commonly known as:  TENORMIN  Take 0.5 tablets (12.5 mg total) by mouth daily.     chlorhexidine 0.12 % solution  Commonly known as:  PERIDEX  15 mLs by Mouth Rinse route 2 (two) times daily.     feeding  supplement (RESOURCE BREEZE) Liqd  Take 1 Container by mouth 2 (two) times daily between meals.     folic acid 1 MG tablet  Commonly known as:  FOLVITE  Take 1 tablet (1 mg total) by mouth daily.     levETIRAcetam 1000 MG tablet  Commonly known as:  KEPPRA  Take 1 tablet (1,000 mg total) by mouth 2 (two) times daily.     metroNIDAZOLE 500 MG tablet  Commonly known as:  FLAGYL  Take 1 tablet (500 mg total) by mouth every 8 (eight) hours.     thiamine 100 MG tablet  Take 1 tablet (100 mg total) by mouth daily.     Valproic  Acid 250 MG/5ML Syrp syrup  Commonly known as:  DEPAKENE  - 500 mg in Am  - 250 mg mid-day  - 500 mg in PM       Allergies  Allergen Reactions  . Morphine And Related Other (See Comments)    REACTION: Causes pain  . Paroxetine Hcl Other (See Comments)    REACTION:  unknown  . Penicillins Other (See Comments)    REACTION: Makes her body hurt.  . Sertraline Hcl Other (See Comments)    REACTION:  unknown   Follow-up Information   Please follow up. (follow up at [facility])        The results of significant diagnostics from this hospitalization (including imaging, microbiology, ancillary and laboratory) are listed below for reference.    Significant Diagnostic Studies: Ct Head Wo Contrast  05/13/2013   CLINICAL DATA:  Diminished level of consciousness.  EXAM: CT HEAD WITHOUT CONTRAST  TECHNIQUE: Contiguous axial images were obtained from the base of the skull through the vertex without intravenous contrast.  COMPARISON:  Head CT from 2 days prior  FINDINGS: Skull and Sinuses:No significant abnormality.  Orbits: No acute abnormality.  Brain: No evidence of acute abnormality, such as acute infarction, hemorrhage, hydrocephalus, or mass lesion/mass effect. Globally low cerebral volume for age.  IMPRESSION: No evidence of acute intracranial disease. Low cerebral volume for age.   Electronically Signed   By: Tiburcio Pea M.D.   On: 05/13/2013 06:16   Ct Head Wo  Contrast  05/11/2013   CLINICAL DATA:  Altered mental status.  Bruises all over body.  EXAM: CT HEAD WITHOUT CONTRAST  TECHNIQUE: Contiguous axial images were obtained from the base of the skull through the vertex without contrast.  COMPARISON:  None  FINDINGS: Premature for age cerebral and cerebellar atrophy. Hypoattenuation of white matter representing chronic microvascular ischemic change. Calvarium intact. No sinus or mastoid fluid. Scalp and extracranial soft tissues grossly unremarkable.  IMPRESSION: Premature for age cerebral and cerebellar atrophy with white matter disease. No acute intracranial findings. No intracranial hemorrhage or skull fracture.   Electronically Signed   By: Davonna Belling M.D.   On: 05/11/2013 17:53   Ct Pelvis W Contrast  05/17/2013   CLINICAL DATA:  Rectovaginal fistula.  EXAM: CT PELVIS WITH CONTRAST  TECHNIQUE: Multidetector CT imaging of the pelvis was performed using the standard protocol following the bolus administration of intravenous contrast.  CONTRAST:  OMNIPAQUE IOHEXOL 300 MG/ML  SOLN  COMPARISON:  None.  FINDINGS: Rectal contrast was administered. No contrast extravasation into the vagina to suggest a rectovaginal fistula. There is 1 small dot of air in the vagina. No contrast enters the bladder from the rectum. No pelvic mass, significant free pelvic fluid collection an or lymphadenopathy. The bony structures are unremarkable.  IMPRESSION: No CT findings to suggest a rectovaginal fistula.   Electronically Signed   By: Loralie Champagne M.D.   On: 05/17/2013 16:42   Mr Brain Wo Contrast  05/16/2013   CLINICAL DATA:  Altered mental status.  EXAM: MRI HEAD WITHOUT CONTRAST  TECHNIQUE: Multiplanar, multiecho pulse sequences of the brain and surrounding structures were obtained without intravenous contrast.  COMPARISON:  05/13/2013.  FINDINGS: The patient was unable to remain motionless for the exam. Small or subtle lesions could be overlooked.  There is  interval improvement in the previously identified symmetric bilateral medial and dorsal thalamic restricted diffusion with T2 and FLAIR hyperintensity. Very slight and symmetric restricted diffusion is seen medially in the  areas affected previously. There is also near normalization of the T2 and FLAIR hyperintensity. No new areas of restricted diffusion or brain edema. No other new findings. Partial empty sella re- demonstrated.  IMPRESSION: Interval improvement in the previously identified symmetric areas of thalamic (gray matter) restricted diffusion and T2 signal prolongation. Favor improving metabolic process (hypernatremia? Renal insufficiency?), versus normalization of postictal phenomenon.  No new abnormalities are seen.   Electronically Signed   By: Davonna Belling M.D.   On: 05/16/2013 18:42   Mr Laqueta Jean ZO Contrast  05/13/2013   CLINICAL DATA:  52 year old female with decreased responsiveness and altered mental status. Currently unresponsive. UTI last month. Nausea vomiting. Initial encounter.  EXAM: MRI HEAD WITHOUT AND WITH CONTRAST  TECHNIQUE: Multiplanar, multiecho pulse sequences of the brain and surrounding structures were obtained without and with intravenous contrast.  CONTRAST:  15 mL MultiHance.  COMPARISON:  Head CTs 05/13/2013 and earlier.  FINDINGS: Abnormal T2 and FLAIR hyperintensity in the medial and posterior thalami a in a symmetric pattern associated with increased trace diffusion signal and perhaps mildly restricted diffusion. T1 signal appears relatively preserved. No associated enhancement. No associated mass effect or evidence of blood products.  No other restricted diffusion identified. Major intracranial vascular flow voids are preserved.  Intermittent mild motion artifact. No other abnormal gray or white matter signal identified in the brain. Partially empty sella configuration. No abnormal enhancement identified. No ventriculomegaly. No midline shift, mass effect, or evidence of  intracranial mass lesion.  Visualized orbit soft tissues are within normal limits. Visualized paranasal sinuses and mastoids are clear. Visualized bone marrow signal is within normal limits. Negative cervicomedullary junction and visualized cervical spine. Visualized scalp soft tissues are within normal limits.  IMPRESSION: Symmetric abnormal diffusion, T2, and FLAIR signal in the pulvinar are and dorsomedial thalami. No enhancement. No other basal ganglia involvement identified, and no other abnormal signal identified in the brain.  Top differential considerations include variant Creutzfeldt-Jakob disease, hypoxic ischemic injury with bilateral thalamic infarcts, and less likely ADEM.   Electronically Signed   By: Augusto Gamble M.D.   On: 05/13/2013 17:58   Dg Chest Port 1 View  05/11/2013   CLINICAL DATA:  Unresponsive patient.  EXAM: PORTABLE CHEST - 1 VIEW  COMPARISON:  None.  FINDINGS: Cardiac silhouette normal and mediastinal contours unremarkable for the AP portable technique. Pulmonary parenchyma clear. Bronchovascular markings normal. Pulmonary vascularity normal. No pneumothorax. No visible pleural effusions. Degenerative changes noted throughout the thoracic spine.  IMPRESSION: No acute cardiopulmonary disease.   Electronically Signed   By: Hulan Saas M.D.   On: 05/11/2013 17:36   Dg Abd Portable 1v  05/16/2013   CLINICAL DATA:  Obstipation  EXAM: PORTABLE ABDOMEN - 1 VIEW  COMPARISON:  06/01/2011  FINDINGS: Normal bowel gas pattern.  No bowel dilatation or bowel wall thickening.  Bones appear slightly demineralized.  No urinary tract calcification.  IMPRESSION: Normal exam.   Electronically Signed   By: Ulyses Southward M.D.   On: 05/16/2013 22:23   Dg Fluoro Guide Lumbar Puncture  05/14/2013   CLINICAL DATA:  Mental status changes.  EXAM: DIAGNOSTIC LUMBAR PUNCTURE UNDER FLUOROSCOPIC GUIDANCE  FLUOROSCOPY TIME:  0 min and 28 seconds.  PROCEDURE: Informed consent was obtained from the patient  prior to the procedure, including potential complications of headache, allergy, and pain. With the patient prone, the lower back was prepped with Betadine. 1% Lidocaine was used for local anesthesia. Lumbar puncture was performed at the L3-4 level  using a 20 gauge needle with return of clear CSF with an opening pressure of 23 cm water. 12.76ml of CSF were obtained for laboratory studies. The patient tolerated the procedure well and there were no apparent complications.  IMPRESSION: Fluoroscopic guided lumbar puncture with 12.5 cc of clear CSF obtained for appropriate laboratory evaluation.   Electronically Signed   By: Loralie Champagne M.D.   On: 05/14/2013 15:30    Microbiology: Recent Results (from the past 240 hour(s))  CLOSTRIDIUM DIFFICILE BY PCR     Status: Abnormal   Collection Time    05/18/13 10:26 AM      Result Value Range Status   C difficile by pcr POSITIVE (*) NEGATIVE Final   Comment: CRITICAL RESULT CALLED TO, READ BACK BY AND VERIFIED WITH:     MINOR A,RN 1600 05/18/13 SCALES H     Labs: Basic Metabolic Panel:  Recent Labs Lab 05/21/13 0617 05/25/13 0710 05/26/13 0832  NA 139 141 141  K 3.2* 2.6* 3.3*  CL 104 107 108  CO2 20 23 23   GLUCOSE 81 82 80  BUN <3* <3* <3*  CREATININE 0.59 0.61 0.57  CALCIUM 8.1* 7.8* 7.8*  MG  --  1.4*  --    Liver Function Tests: No results found for this basename: AST, ALT, ALKPHOS, BILITOT, PROT, ALBUMIN,  in the last 168 hours No results found for this basename: LIPASE, AMYLASE,  in the last 168 hours No results found for this basename: AMMONIA,  in the last 168 hours CBC:  Recent Labs Lab 05/21/13 0617  WBC 4.8  HGB 9.2*  HCT 27.2*  MCV 89.2  PLT 183   Cardiac Enzymes: No results found for this basename: CKTOTAL, CKMB, CKMBINDEX, TROPONINI,  in the last 168 hours BNP: BNP (last 3 results) No results found for this basename: PROBNP,  in the last 8760 hours CBG:  Recent Labs Lab 05/27/13 0835  GLUCAP 79        Signed:  VANN, JESSICA  [facility] 05/27/2013, 11:30 AM

## 2013-05-27 DIAGNOSIS — A0472 Enterocolitis due to Clostridium difficile, not specified as recurrent: Secondary | ICD-10-CM

## 2013-05-27 LAB — GLUCOSE, CAPILLARY: Glucose-Capillary: 79 mg/dL (ref 70–99)

## 2013-05-27 LAB — CLOSTRIDIUM DIFFICILE BY PCR: Toxigenic C. Difficile by PCR: NEGATIVE

## 2013-05-27 MED ORDER — VALPROATE SODIUM 500 MG/5ML IV SOLN
250.0000 mg | Freq: Every day | INTRAVENOUS | Status: DC
  Administered 2013-05-27: 250 mg via INTRAVENOUS
  Filled 2013-05-27: qty 2.5

## 2013-05-27 MED ORDER — VALPROIC ACID 250 MG/5ML PO SYRP: ORAL_SOLUTION | ORAL | Status: DC

## 2013-05-27 NOTE — Progress Notes (Signed)
Report given to Frances(LPN) at the facility. Awaiting on [facility] for disposition.All assessments remain unchanged as at now.

## 2013-05-27 NOTE — Progress Notes (Signed)
UR complete.  Teghan Philbin RN, MSN 

## 2013-05-27 NOTE — Progress Notes (Addendum)
[facility] PROGRESS NOTE  Joan [name] ZOX:[id number] DOB: April 17, 1961 DOA: 05/11/2013 PCP: No primary provider on file.  Brief narrative:  52 y.o. female who was brought to the ED after having decreased responsiveness for 2-3 days. She was unresponsive at presentation and her son gave the history. 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 He stated that she has not had any food or liquids for the last 203 days. She was diagnosed with a UTI on 04/05/2013 and was prescribed Bactrim therapy but she did not take the medication due to increased nausea and vomiting. Her son also reported that prior to this she had been falling down a lot and had sustained multiple bruises from her falls.  In the ED, she was evaluated and found to be minimally responsive and febrile. She was found to have a UTI, and and increased lactic acid level of 5. She was placed on IV Rocephin and IVFs for rehydration. A CT scan of the head was performed and was found to be negative for acute findings.  Patient admitted to stepdown. She underwent MRI brain and LP. transferred to medical floor for further care.   Assessment/Plan:  Toxic metabolic encephalopathy  -Initially felt to be Combination of sepsis from pyelonephritis and possible psychiatric issues/catatonia  -no acute findings on CT scan of the head  -MRI brain was concerning for either hypoxic vs CJD etiology. follow up MRI 12/18 showed improvement and Neurology feels changes were due to seizures.  -EEG reported abnormal with moderately severe continuous generalized nonspecific slowing of cerebral activity. No evidence of an epileptic disorder was demonstrated.  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 Check HSV and enterovirus PCR.- will be followed up outpatient -SLP recommended D1 diet with thin liquids/supervision  -mental status improving   acute seizure  on 12/23 patient  was noted by neurologist to be poorly responsive with a lateral gaze.. She appeared to be having active seizure. Ordered 1 gm IV keppra and placed on 1000 mg oral keppra bid which she should continue. Ativan IV prn for seizures  -on 12/24 patient again noted to be lethargic. EEG shows encephalopathic changes. Could be related to keppra.  Continue Keppra. Added Depakote on 12/24.  -Continue neuro checks . No seizure activity witnessed for past 72 hours.   Hypokalemia  Replenish with by mouth KCl. Low Magnesium replenished  Acute renal failure  -Likely prerenal with poor po intake  -Resolved with hydration   cdiff  on empiric flagyl for 2 weeks course..diarrhea resolved.  -facility requesting negative c diff before patient can be accepted  Hypernatremia  -resolved  Tachycardia  -Stable after BB resumed.   UTI / pyelonephritis  -culture negative. Completed rocephin ( received for 7 days)   Hx of catatonia  -Son reported two prior episodes but states they have been quite infrequent  - does not appear to be followed chronically by Psychiatry or Neurology .    Communication: d/w son marcus on 12/29  Disposition Plan: SNF likely on 12/30  Consultants:  Neurology   Procedures  LP  Antibiotics:  Rocephin 12/13 >> 12/20  Flagyl (12/20>>)   DVT prophylaxis:  Sq lovenox   HPI/Subjective:  Answer questions Hurting all over  Objective: Filed Vitals:   05/27/13 1340  BP: 104/54  Pulse: 107  Temp: 99.4 F (37.4 C)  Resp: 18    Intake/Output Summary (Last 24 hours) at 05/27/13 1517 Last  data filed at 05/26/13 1700  Gross per 24 hour  Intake      0 ml  Output      0 ml  Net      0 ml   Filed Weights   05/11/13 2205 05/17/13 1520  Weight: 70.1 kg (154 lb 8.7 oz) 70.2 kg (154 lb 12.2 oz)    Exam:  General: Middle aged female in NAD  HEENT: no pallor, moist mucosa  chest: clear b/l, no added sounds  CVS: NS1&S2, no murmurs, rubs or gallop  Abd: soft, NT, ND,  BS+' Ext: warm, no edema  CNS:  AAOX2 , clearer speech   Data Reviewed: Basic Metabolic Panel:  Recent Labs Lab 05/21/13 0617 05/25/13 0710 05/26/13 0832  NA 139 141 141  K 3.2* 2.6* 3.3*  CL 104 107 108  CO2 20 23 23   GLUCOSE 81 82 80  BUN <3* <3* <3*  CREATININE 0.59 0.61 0.57  CALCIUM 8.1* 7.8* 7.8*  MG  --  1.4*  --    Liver Function Tests: No results found for this basename: AST, ALT, ALKPHOS, BILITOT, PROT, ALBUMIN,  in the last 168 hours No results found for this basename: LIPASE, AMYLASE,  in the last 168 hours No results found for this basename: AMMONIA,  in the last 168 hours CBC:  Recent Labs Lab 05/21/13 0617  WBC 4.8  HGB 9.2*  HCT 27.2*  MCV 89.2  PLT 183   Cardiac Enzymes: No results found for this basename: CKTOTAL, CKMB, CKMBINDEX, TROPONINI,  in the last 168 hours BNP (last 3 results) No results found for this basename: PROBNP,  in the last 8760 hours CBG:  Recent Labs Lab 05/27/13 0835  GLUCAP 79    Recent Results (from the past 240 hour(s))  CLOSTRIDIUM DIFFICILE BY PCR     Status: Abnormal   Collection Time    05/18/13 10:26 AM      Result Value Range Status   C difficile by pcr POSITIVE (*) NEGATIVE Final   Comment: CRITICAL RESULT CALLED TO, READ BACK BY AND VERIFIED WITH:     MINOR A,RN 1600 05/18/13 SCALES H     Studies: No results found.  Scheduled Meds: . antiseptic oral rinse  15 mL Mouth Rinse q12n4p  . atenolol  12.5 mg Oral Daily  . chlorhexidine  15 mL Mouth Rinse BID  . enoxaparin (LOVENOX) injection  40 mg Subcutaneous Q24H  . feeding supplement (RESOURCE BREEZE)  1 Container Oral BID BM  . folic acid  1 mg Oral Daily  . influenza vac split quadrivalent PF  0.5 mL Intramuscular Tomorrow-1000  . levETIRAcetam  1,000 mg Oral BID  . LORazepam  1 mg Intravenous Once  . metroNIDAZOLE  500 mg Oral Q8H  . potassium chloride  40 mEq Oral Once  . thiamine  100 mg Oral Daily  . valproate sodium  250 mg Intravenous  Q1400  . valproate sodium  500 mg Intravenous Q12H   Continuous Infusions:      Time spent: 25 minutes    Markia Kyer  [facility] Pager [phone]. If 7PM-7AM, please contact night-coverage at [email or web address], password [facility] 05/27/2013, 3:17 PM  LOS: 16 days

## 2013-05-27 NOTE — Progress Notes (Signed)
[facility] here to transport patient to facility. Her son has been updated.

## 2013-05-27 NOTE — Progress Notes (Addendum)
Subjective: Slightly sleepier today.   Exam: Filed Vitals:   05/27/13 0944  BP: 147/70  Pulse: 80  Temp: 97.3 F (36.3 C)  Resp: 18   Gen: In bed, NAD MS: Somnolent, but answers questions and follows commands.  Oriented to hospital, but not which one.  WU:[id number], VFF Motor: moves all extremities, though not clear she is giving full effort with strength testing.  Sensory:endorses symmetric sensation.   Impression: 52 yo F with AMS. 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 Her LP results do raise the possibility that this represents encephalitis, however this would not explain her previous episodes and prolonged seizures can cause a pleocytosis.  She seems to be improving, though over the past day has not had as much progress.   Recommendations: 1) tau, 1433 pending, though positive would more likely represent a different destructive process such as encephalitis rather than Creutzfeldt-Jakob disease given that her history does not sound consistent with this diagnosis. 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 This will take several weeks to come back, no need for continued hospitalization until this comes back.   [name], MD [facility] [phone]  If 7pm- 7am, please page neurology on call at [phone].

## 2013-05-27 NOTE — Progress Notes (Signed)
Follow up Appointment at 13:30 on June 12, 2012 with Dr. Riley Nearing

## 2013-05-27 NOTE — Clinical Social Work Placement (Signed)
     Clinical Social Work Department CLINICAL SOCIAL WORK PLACEMENT NOTE 05/27/2013  Patient:  Joan [name], Joan [name]  Account Number:  [id number] Admit date:  05/11/2013  Clinical Social Worker:  Cherre Blanc, [place]  Date/time:  05/14/2013 03:26 PM  Clinical Social Work is seeking post-discharge placement for this patient at the following level of care:   SKILLED NURSING   (*CSW will update this form in Epic as items are completed)   05/14/2013  Patient/family provided with [facility] Department of Clinical Social Works list of facilities offering this level of care within the geographic area requested by the patient (or if unable, by the patients family).  05/14/2013  Patient/family informed of their freedom to choose among providers that offer the needed level of care, that participate in Medicare, Medicaid or managed care program needed by the patient, have an available bed and are willing to accept the patient.  05/14/2013  Patient/family informed of [facility] ownership interest in [facility], as well as of the fact that they are under no obligation to receive care at this facility.  PASARR submitted to EDS on  PASARR number received from EDS on   FL2 transmitted to all facilities in geographic area requested by pt/family on  05/14/2013 FL2 transmitted to all facilities within larger geographic area on   Patient informed that his/her managed care company has contracts with or will negotiate with  certain facilities, including the following:   Has existing PASARR     Patient/family informed of bed offers received:  05/15/2013 Patient chooses bed at [facility] Physician recommends and patient chooses bed at    Patient to be transferred to [facility] on  05/27/2013 Patient to be transferred to facility by Ambulance  Sharin Mons)  The following physician request were entered in Epic:   Additional Comments: 05/27/13  OK per MD for d/c today to [facility].  Cdiff negative per nursing. Ok to accept patient per Donne Hazel, Admissions Director of [facility].  Patient's son- Doylene Bode notified and was pleased with d/c plan to SN. He signed admit papers with facility this morning.  No furhter CSW needs identified. CSW signing off.  Lorri Frederick. Khamya Topp, LCSWA  [phone]

## 2013-05-31 ENCOUNTER — Non-Acute Institutional Stay: Payer: BC Managed Care – PPO | Admitting: Internal Medicine

## 2013-05-31 DIAGNOSIS — I10 Essential (primary) hypertension: Secondary | ICD-10-CM

## 2013-05-31 DIAGNOSIS — R569 Unspecified convulsions: Secondary | ICD-10-CM

## 2013-05-31 DIAGNOSIS — E876 Hypokalemia: Secondary | ICD-10-CM

## 2013-05-31 DIAGNOSIS — A048 Other specified bacterial intestinal infections: Secondary | ICD-10-CM

## 2013-05-31 NOTE — Progress Notes (Signed)
HISTORY & PHYSICAL  DATE: 05/31/2013   FACILITY: [name]  LEVEL OF CARE: SNF (31)  ALLERGIES:  Allergies  Allergen Reactions  . Morphine And Related Other (See Comments)    REACTION: Causes pain  . Paroxetine Hcl Other (See Comments)    REACTION:  unknown  . Penicillins Other (See Comments)    REACTION: Makes her body hurt.  . Sertraline Hcl Other (See Comments)    REACTION:  unknown    CHIEF COMPLAINT:  Manage seizure, hypokalemia and hypertension  HISTORY OF PRESENT ILLNESS: Patient is a 53 year old African American female who was hospitalized secondary to acute encephalopathy. After hospitalization she is admitted to this facility for short-term rehabilitation. Patient is a poor historian.  SEIZURE DISORDER: The patient's seizure disorder remains stable. No complications reported from the medications presently being used. Staff do not report any recent seizure activity.  HYPOKALEMIA: The patient's hypokalemia remains stable. Staff deny muscle cramping or palpitations. No complications reported from current potassium supplementation.  HTN: Pt 's HTN remains stable.  Staff Deny CP, sob, DOE, pedal edema, headaches, dizziness or visual disturbances.  No complications from the medications currently being used.  Last BP : 150/88   PAST MEDICAL HISTORY :  Past Medical History  Diagnosis Date  . Peripheral neuropathy   . Fibromyalgia   . Hypertension   . Anxiety   . Catatonia     PAST SURGICAL HISTORY: Past Surgical History  Procedure Laterality Date  . Gastric bypass    . Abdominal hysterectomy      SOCIAL HISTORY:  reports that she has never smoked. She does not have any smokeless tobacco history on file. She reports that she does not drink alcohol.  FAMILY HISTORY:  Family History  Problem Relation Age of Onset  . Hypertension Mother   . Diabetes Father     [facility] MEDICATIONS: [facility]  REVIEW OF SYSTEMS:  Unobtainable  due to patient being a poor historian  PHYSICAL EXAMINATION  VS:  T 96.8       P 99       RR 18      BP 150/88        GENERAL: no acute distress, normal body habitus EYES: conjunctivae normal, sclerae normal, normal eye lids MOUTH/THROAT: lips without lesions,no lesions in the mouth,tongue is without lesions,uvula elevates in midline NECK: supple, trachea midline, no neck masses, no thyroid tenderness, no thyromegaly LYMPHATICS: no LAN in the neck, no supraclavicular LAN RESPIRATORY: breathing is even & unlabored, BS CTAB CARDIAC: RRR, no murmur,no extra heart sounds, no edema GI:  ABDOMEN: abdomen soft, normal BS, no masses, no tenderness  LIVER/SPLEEN: no hepatomegaly, no splenomegaly MUSCULOSKELETAL: HEAD: normal to inspection & palpation BACK: no kyphosis, scoliosis or spinal processes tenderness EXTREMITIES: LEFT UPPER EXTREMITY: full range of motion, normal strength & tone RIGHT UPPER EXTREMITY:  full range of motion, normal strength & tone LEFT LOWER EXTREMITY:   range of motion unable to assess, normal strength & tone RIGHT LOWER EXTREMITY:  range of motion unable to assess, normal strength & tone PSYCHIATRIC: the patient is alert & dioriented, affect & behavior appropriate  LABS/RADIOLOGY:  Labs reviewed: Basic Metabolic Panel:  Recent Labs  16/02/9611/14/14 0640 05/13/13 0345  05/21/13 0617 05/25/13 0710 05/26/13 0832  NA 148* 152*  < > 139 141 141  K 3.4* 3.1*  < > 3.2* 2.6* 3.3*  CL 115* 119*  < > 104 107 108  CO2 17* 15*  < >  20 23 23   GLUCOSE 114* 94  < > 81 82 80  BUN 45* 31*  < > <3* <3* <3*  CREATININE 1.28* 1.08  < > 0.59 0.61 0.57  CALCIUM 8.1* 8.1*  < > 8.1* 7.8* 7.8*  MG  --  2.1  --   --  1.4*  --   < > = values in this interval not displayed. Liver Function Tests:  Recent Labs  05/13/13 0345 05/14/13 0450 05/16/13 0940  AST 24 26 34  ALT 17 16 19   ALKPHOS 36* 36* 41  BILITOT 0.6 0.4 0.6  PROT 6.1 5.7* 6.1  ALBUMIN 2.7* 2.6* 2.8*    Recent  Labs  05/13/13 0345  AMMONIA 22   CBC:  Recent Labs  05/11/13 1755  05/16/13 0940 05/18/13 0520 05/21/13 0617  WBC 8.8  < > 5.2 5.3 4.8  NEUTROABS 6.1  --   --   --   --   HGB 14.3  < > 9.7* 8.9* 9.2*  HCT 41.9  < > 28.0* 26.1* 27.2*  MCV 89.9  < > 88.1 88.5 89.2  PLT 53*  < > 51* 82* 183  < > = values in this interval not displayed.   Cardiac Enzymes:  Recent Labs  05/11/13 1755 05/13/13 1755  CKTOTAL  --  79  TROPONINI <0.30  --     CBG:  Recent Labs  05/11/13 1750 05/27/13 0835  GLUCAP 116* 79    EXAM: PORTABLE CHEST - 1 VIEW   COMPARISON:  None.   FINDINGS: Cardiac silhouette normal and mediastinal contours unremarkable for the AP portable technique. Pulmonary parenchyma clear. Bronchovascular markings normal. Pulmonary vascularity normal. No pneumothorax. No visible pleural effusions. Degenerative changes noted throughout the thoracic spine.   IMPRESSION: No acute cardiopulmonary disease.   EXAM: CT HEAD WITHOUT CONTRAST   TECHNIQUE: Contiguous axial images were obtained from the base of the skull through the vertex without intravenous contrast.   COMPARISON:  Head CT from 2 days prior   FINDINGS: Skull and Sinuses:No significant abnormality.   Orbits: No acute abnormality.   Brain: No evidence of acute abnormality, such as acute infarction, hemorrhage, hydrocephalus, or mass lesion/mass effect. Globally low cerebral volume for age.   IMPRESSION: No evidence of acute intracranial disease. Low cerebral volume for age. EXAM: MRI HEAD WITHOUT CONTRAST   TECHNIQUE: Multiplanar, multiecho pulse sequences of the brain and surrounding structures were obtained without intravenous contrast.   COMPARISON:  05/13/2013.   FINDINGS: The patient was unable to remain motionless for the exam. Small or subtle lesions could be overlooked.   There is interval improvement in the previously identified symmetric bilateral medial and dorsal  thalamic restricted diffusion with T2 and FLAIR hyperintensity. Very slight and symmetric restricted diffusion is seen medially in the areas affected previously. There is also near normalization of the T2 and FLAIR hyperintensity. No new areas of restricted diffusion or brain edema. No other new findings. Partial empty sella re- demonstrated.   IMPRESSION: Interval improvement in the previously identified symmetric areas of thalamic (gray matter) restricted diffusion and T2 signal prolongation. Favor improving metabolic process (hypernatremia? Renal insufficiency?), versus normalization of postictal phenomenon.   No new abnormalities are seen.   EXAM: PORTABLE ABDOMEN - 1 VIEW   COMPARISON:  06/01/2011   FINDINGS: Normal bowel gas pattern.   No bowel dilatation or bowel wall thickening.   Bones appear slightly demineralized.   No urinary tract calcification.   IMPRESSION: Normal exam.  EXAM: CT PELVIS WITH CONTRAST   TECHNIQUE: Multidetector CT imaging of the pelvis was performed using the standard protocol following the bolus administration of intravenous contrast.   CONTRAST:  OMNIPAQUE IOHEXOL 300 MG/ML  SOLN   COMPARISON:  None.   FINDINGS: Rectal contrast was administered. No contrast extravasation into the vagina to suggest a rectovaginal fistula. There is 1 small dot of air in the vagina. No contrast enters the bladder from the rectum. No pelvic mass, significant free pelvic fluid collection an or lymphadenopathy. The bony structures are unremarkable.   IMPRESSION: No CT findings to suggest a rectovaginal fistula.   Stool for C. difficile toxin positive  ASSESSMENT/PLAN:  Seizure disorder-stable Hypertension-blood pressure elevated. Monitor. Hypokalemia-continue supplementation and reassess C. difficile colitis-completed Flagyl today Anemia of chronic disease-reassess Anorexia-Megace was started Hypomagnesemia-repleted in the  hospital Check CBC, CMP and Depakote level  I have reviewed patient's medical records received at admission/from hospitalization.  CPT CODE: 16109

## 2013-06-04 LAB — MISCELLANEOUS TEST

## 2013-06-04 LAB — ADMARK(R) TAU/AB42, CSF

## 2013-06-05 ENCOUNTER — Non-Acute Institutional Stay: Payer: BC Managed Care – PPO | Admitting: Internal Medicine

## 2013-06-05 DIAGNOSIS — R569 Unspecified convulsions: Secondary | ICD-10-CM

## 2013-06-05 DIAGNOSIS — E876 Hypokalemia: Secondary | ICD-10-CM

## 2013-06-05 DIAGNOSIS — R197 Diarrhea, unspecified: Secondary | ICD-10-CM

## 2013-06-05 NOTE — Progress Notes (Signed)
PROGRESS NOTE  DATE: 06/05/2013  FACILITY:  [name]  LEVEL OF CARE: SNF (31)  Acute Visit  CHIEF COMPLAINT:  Manage diarrhea and seizure disorder  HISTORY OF PRESENT ILLNESS: I was requested by the staff to assess the patient regarding above problem(s):  DIARRHEA: Staff reports that patient has frequent diarrhea. She is not eating stating that when she she gets the diarrhea. She has a history of irritable bowel syndrome. Patient overall is a poor historian. She does admit to frequent diarrhea and not wanting to eat. She denies nausea, vomiting or abdominal pain.  SEIZURE DISORDER: The patient's seizure disorder remains stable. No complications reported from the medications presently being used. Staff do not report any recent seizure activity. I was requested by the staff to assess the patient for continued need for Depakote.  PAST MEDICAL HISTORY : Reviewed.  No changes.  CURRENT MEDICATIONS: [facility] per [facility]  REVIEW OF SYSTEMS: Difficult to obtain, patient is a poor historian  PHYSICAL EXAMINATION  GENERAL: no acute distress, normal body habitus EYES: conjunctivae normal, sclerae normal, normal eye lids NECK: supple, trachea midline, no neck masses, no thyroid tenderness, no thyromegaly LYMPHATICS: no LAN in the neck, no supraclavicular LAN RESPIRATORY: breathing is even & unlabored, BS CTAB CARDIAC: RRR, no murmur,no extra heart sounds, no edema GI: abdomen soft, diminished BS, no masses, no tenderness, no hepatomegaly, no splenomegaly PSYCHIATRIC: the patient is alert & oriented to person, affect & behavior appropriate  LABS/RADIOLOGY:  06-03-13 Depakote level 88.6, hemoglobin 11.3, MCV 96.1 otherwise CBC normal, potassium 3.1 otherwise BMP normal   ASSESSMENT/PLAN:  Diarrhea-likely due to IBS. Start Imodium when necessary Seizure disorder-well-controlled. Patient is on Depakote to seizures. Hypokalemia-new problem. Likely due to diarrhea and  lack of by mouth intake. Will give KCL 40meq one time dose. Recheck potassium and magnesium level.   CPT CODE: [id number]

## 2013-06-12 ENCOUNTER — Ambulatory Visit (INDEPENDENT_AMBULATORY_CARE_PROVIDER_SITE_OTHER): Payer: BC Managed Care – PPO | Admitting: Neurology

## 2013-06-12 ENCOUNTER — Ambulatory Visit (INDEPENDENT_AMBULATORY_CARE_PROVIDER_SITE_OTHER): Payer: BC Managed Care – PPO | Admitting: Radiology

## 2013-06-12 ENCOUNTER — Encounter: Payer: Self-pay | Admitting: Neurology

## 2013-06-12 VITALS — BP 117/84 | HR 99

## 2013-06-12 DIAGNOSIS — G609 Hereditary and idiopathic neuropathy, unspecified: Secondary | ICD-10-CM

## 2013-06-12 DIAGNOSIS — G0491 Myelitis, unspecified: Secondary | ICD-10-CM

## 2013-06-12 DIAGNOSIS — R404 Transient alteration of awareness: Secondary | ICD-10-CM

## 2013-06-12 DIAGNOSIS — G0490 Encephalitis and encephalomyelitis, unspecified: Secondary | ICD-10-CM

## 2013-06-12 NOTE — Patient Instructions (Signed)
I had a long discussion with the patient's son regarding her recent illness, discuss results of imaging findings and lab test and answered questions. Continue Keppra and Depakote in the current dosages for seizures. Continue ongoing physical and occupational therapy. Check followup repeat MRI scan an EEG. Return for followup in 2 months with Larita FifeLynn, NP or call earlier if necessary

## 2013-06-13 ENCOUNTER — Other Ambulatory Visit: Payer: Self-pay

## 2013-06-13 ENCOUNTER — Emergency Department (HOSPITAL_COMMUNITY): Payer: BC Managed Care – PPO

## 2013-06-13 ENCOUNTER — Emergency Department (HOSPITAL_COMMUNITY)
Admission: EM | Admit: 2013-06-13 | Discharge: 2013-06-14 | Disposition: A | Discharge status: Home / Self Care | Payer: BC Managed Care – PPO | Attending: Emergency Medicine | Admitting: Emergency Medicine

## 2013-06-13 ENCOUNTER — Encounter (HOSPITAL_COMMUNITY): Payer: Self-pay | Admitting: Emergency Medicine

## 2013-06-13 DIAGNOSIS — Z880 Allergy status to penicillin: Secondary | ICD-10-CM | POA: Insufficient documentation

## 2013-06-13 DIAGNOSIS — I10 Essential (primary) hypertension: Secondary | ICD-10-CM | POA: Insufficient documentation

## 2013-06-13 DIAGNOSIS — Z79899 Other long term (current) drug therapy: Secondary | ICD-10-CM | POA: Insufficient documentation

## 2013-06-13 DIAGNOSIS — R630 Anorexia: Secondary | ICD-10-CM | POA: Insufficient documentation

## 2013-06-13 DIAGNOSIS — R45851 Suicidal ideations: Secondary | ICD-10-CM | POA: Insufficient documentation

## 2013-06-13 DIAGNOSIS — F411 Generalized anxiety disorder: Secondary | ICD-10-CM | POA: Insufficient documentation

## 2013-06-13 DIAGNOSIS — G40909 Epilepsy, unspecified, not intractable, without status epilepticus: Secondary | ICD-10-CM | POA: Insufficient documentation

## 2013-06-13 LAB — COMPREHENSIVE METABOLIC PANEL
ALBUMIN: 2.7 g/dL — AB (ref 3.5–5.2)
ALK PHOS: 42 U/L (ref 39–117)
ALT: 9 U/L (ref 0–35)
AST: 26 U/L (ref 0–37)
BILIRUBIN TOTAL: 0.4 mg/dL (ref 0.3–1.2)
BUN: 11 mg/dL (ref 6–23)
CHLORIDE: 98 meq/L (ref 96–112)
CO2: 23 mEq/L (ref 19–32)
Calcium: 8.1 mg/dL — ABNORMAL LOW (ref 8.4–10.5)
Creatinine, Ser: 0.89 mg/dL (ref 0.50–1.10)
GFR calc Af Amer: 86 mL/min — ABNORMAL LOW (ref >90)
GFR calc non Af Amer: 74 mL/min — ABNORMAL LOW (ref >90)
Glucose, Bld: 85 mg/dL (ref 70–99)
POTASSIUM: 3.5 meq/L — AB (ref 3.7–5.3)
SODIUM: 135 meq/L — AB (ref 137–147)
TOTAL PROTEIN: 6.2 g/dL (ref 6.0–8.3)

## 2013-06-13 LAB — CBC WITH DIFFERENTIAL/PLATELET
BASOS ABS: 0 10*3/uL (ref 0.0–0.1)
BASOS PCT: 0 % (ref 0–1)
EOS ABS: 0 10*3/uL (ref 0.0–0.7)
EOS PCT: 0 % (ref 0–5)
HCT: 35.3 % — ABNORMAL LOW (ref 36.0–46.0)
Hemoglobin: 11.7 g/dL — ABNORMAL LOW (ref 12.0–15.0)
Lymphocytes Relative: 30 % (ref 12–46)
Lymphs Abs: 2.6 10*3/uL (ref 0.7–4.0)
MCH: 31.2 pg (ref 26.0–34.0)
MCHC: 33.1 g/dL (ref 30.0–36.0)
MCV: 94.1 fL (ref 78.0–100.0)
Monocytes Absolute: 1 10*3/uL (ref 0.1–1.0)
Monocytes Relative: 12 % (ref 3–12)
Neutro Abs: 5 10*3/uL (ref 1.7–7.7)
Neutrophils Relative %: 58 % (ref 43–77)
PLATELETS: 132 10*3/uL — AB (ref 150–400)
RBC: 3.75 MIL/uL — ABNORMAL LOW (ref 3.87–5.11)
RDW: 16.5 % — AB (ref 11.5–15.5)
WBC: 8.7 10*3/uL (ref 4.0–10.5)

## 2013-06-13 LAB — URINALYSIS, ROUTINE W REFLEX MICROSCOPIC
Glucose, UA: NEGATIVE mg/dL
Hgb urine dipstick: NEGATIVE
Ketones, ur: NEGATIVE mg/dL
Nitrite: NEGATIVE
PH: 6 (ref 5.0–8.0)
PROTEIN: 30 mg/dL — AB
SPECIFIC GRAVITY, URINE: 1.046 — AB (ref 1.005–1.030)
Urobilinogen, UA: 1 mg/dL (ref 0.0–1.0)

## 2013-06-13 LAB — RAPID URINE DRUG SCREEN, HOSP PERFORMED
AMPHETAMINES: NOT DETECTED
Barbiturates: NOT DETECTED
Benzodiazepines: NOT DETECTED
Cocaine: NOT DETECTED
Opiates: NOT DETECTED
TETRAHYDROCANNABINOL: NOT DETECTED

## 2013-06-13 LAB — ETHANOL: Alcohol, Ethyl (B): <11 mg/dL (ref 0–11)

## 2013-06-13 LAB — URINE MICROSCOPIC-ADD ON

## 2013-06-13 MED ORDER — SODIUM CHLORIDE 0.9 % IV SOLN: 1000.0000 mL | INTRAVENOUS | Status: DC

## 2013-06-13 MED ORDER — SODIUM CHLORIDE 0.9 % IV SOLN
1000.0000 mL | Freq: Once | INTRAVENOUS | Status: AC
  Administered 2013-06-13: 1000 mL via INTRAVENOUS

## 2013-06-13 NOTE — ED Provider Notes (Signed)
CSN: [id number]     Arrival date & time 06/13/13  4098 History  First MD Initiated Contact with Patient 06/13/13 1856     Chief Complaint  Patient presents with  . Failure To Thrive    HPI Pt presents to the ED from her nursing facility because she had not eaten for the last four days.  Pt states she did not realize that she had not been eating.  She denies complaints now other than the IV in her hand.  She denies headache, chest pain or abdominal pain.  She denies headache or fever. Patient does have history of seizures but denies any recent seizures. She is willing to try to eat something here. Patient does have a past medical history of catatonia and similar behavior in the past.   Past Medical History  Diagnosis Date  . Peripheral neuropathy   . Fibromyalgia   . Hypertension   . Anxiety   . Catatonia    Past Surgical History  Procedure Laterality Date  . Gastric bypass    . Abdominal hysterectomy     Family History  Problem Relation Age of Onset  . Hypertension Mother   . Diabetes Father    History  Substance Use Topics  . Smoking status: Never Smoker   . Smokeless tobacco: Not on file  . Alcohol Use: No   OB History   Grav Para Term Preterm Abortions TAB SAB Ect Mult Living                 Review of Systems  All other systems reviewed and are negative.    Allergies  Morphine and related; Paroxetine hcl; Penicillins; and Sertraline hcl  Home Medications   Current Outpatient Rx  Name  Route  Sig  Dispense  Refill  . atenolol (TENORMIN) 12.5 mg TABS tablet   Oral   Take 12.5 mg by mouth daily as needed (HR > or = 110 or BP >or =140/90.).         [name] atenolol (TENORMIN) 25 MG tablet   Oral   Take 25 mg by mouth daily.         . chlorhexidine (PERIDEX) 0.12 % solution   Mouth/Throat   Use as directed 15 mLs in the mouth or throat 2 (two) times daily.         . feeding supplement, RESOURCE BREEZE, (RESOURCE BREEZE) LIQD   Oral   Take 1 Container by  mouth 2 (two) times daily between meals.         . folic acid (FOLVITE) 1 MG tablet   Oral   Take 1 mg by mouth daily.         [name] levETIRAcetam (KEPPRA) 1000 MG tablet   Oral   Take 1,000 mg by mouth 2 (two) times daily.         [name] loperamide (IMODIUM) 2 MG capsule   Oral   Take 2 mg by mouth as needed for diarrhea or loose stools.         . megestrol (MEGACE) 400 MG/10ML suspension   Oral   Take 400 mg by mouth daily.         . methocarbamol (ROBAXIN) 500 MG tablet   Oral   Take 500 mg by mouth every 6 (six) hours as needed for muscle spasms.         . mirtazapine (REMERON SOL-TAB) 30 MG disintegrating tablet   Oral   Take 30 mg by mouth at bedtime.         [name]  Multiple Vitamins-Minerals (ELDERTONIC PO)   Oral   Take 15 mLs by mouth 2 (two) times daily.         . Nutritional Supplements (NUTRITIONAL SHAKE) LIQD   Oral   Take 1 Can by mouth 3 (three) times daily with meals. Mighty shakes.         . potassium chloride 20 MEQ/15ML (10%) solution   Oral   Take 20 mEq by mouth daily. For 5 days.         [name]. sulfamethoxazole-trimethoprim (BACTRIM DS) 800-160 MG per tablet   Oral   Take 1 tablet by mouth 2 (two) times daily. For 7 days. For UTI.         [name]. thiamine (VITAMIN B-1) 100 MG tablet   Oral   Take 100 mg by mouth daily.         . Valproic Acid (DEPAKENE) 250 MG/5ML SYRP syrup   Oral   Take 250-500 mg by mouth 3 (three) times daily. Give 500 mg every morning, 250 mg at noon, and 500 mg at bedtime for seizure disorder.          BP 131/61  Pulse 103  Temp(Src) 99.8 F (37.7 C) (Rectal)  Resp 15  SpO2 100% Physical Exam  Nursing note and vitals reviewed. Constitutional: She is oriented to person, place, and time. She appears well-developed and well-nourished. No distress.  HENT:  Head: Normocephalic and atraumatic.  Right Ear: External ear normal.  Left Ear: External ear normal.  Eyes: Conjunctivae are normal. Right eye exhibits no  discharge. Left eye exhibits no discharge. No scleral icterus.  Neck: Neck supple. No tracheal deviation present.  Cardiovascular: Normal rate, regular rhythm and intact distal pulses.   Pulmonary/Chest: Effort normal and breath sounds normal. No stridor. No respiratory distress. She has no wheezes. She has no rales.  Abdominal: Soft. Bowel sounds are normal. She exhibits no distension. There is no tenderness. There is no rebound and no guarding.  Musculoskeletal: She exhibits no edema and no tenderness.  Neurological: She is alert and oriented to person, place, and time. She has normal strength. No sensory deficit. Cranial nerve deficit: No facial droop, extraocular movements intact, tongue is midline, normal speech. She exhibits normal muscle tone. She displays no seizure activity. Coordination normal.  Skin: Skin is warm and dry. No rash noted.  Psychiatric: She has a normal mood and affect. Her speech is not slurred. She is slowed. She is not agitated, not aggressive and not withdrawn. She expresses suicidal ideation. She expresses suicidal plans. She is communicative.    ED Course  Procedures (including critical care time) Labs Review Labs Reviewed  URINALYSIS, ROUTINE W REFLEX MICROSCOPIC - Abnormal; Notable for the following:    Color, Urine AMBER (*)    APPearance CLOUDY (*)    Specific Gravity, Urine 1.046 (*)    Bilirubin Urine SMALL (*)    Protein, ur 30 (*)    Leukocytes, UA TRACE (*)    All other components within normal limits  CBC WITH DIFFERENTIAL - Abnormal; Notable for the following:    RBC 3.75 (*)    Hemoglobin 11.7 (*)    HCT 35.3 (*)    RDW 16.5 (*)    Platelets 132 (*)    All other components within normal limits  COMPREHENSIVE METABOLIC PANEL - Abnormal; Notable for the following:    Sodium 135 (*)    Potassium 3.5 (*)    Calcium 8.1 (*)    Albumin 2.7 (*)  GFR calc non Af Amer 74 (*)    GFR calc Af Amer 86 (*)    All other components within normal  limits  URINE MICROSCOPIC-ADD ON - Abnormal; Notable for the following:    Bacteria, UA FEW (*)    All other components within normal limits  URINE CULTURE  ETHANOL  URINE RAPID DRUG SCREEN (HOSP PERFORMED)   Imaging Review Ct Head Wo Contrast  06/13/2013   CLINICAL DATA:  Altered mental status. Failure to thrive. Seizures.  EXAM: CT HEAD WITHOUT CONTRAST  TECHNIQUE: Contiguous axial images were obtained from the base of the skull through the vertex without intravenous contrast.  COMPARISON:  05/13/2013  FINDINGS: There is no evidence of intracranial hemorrhage, brain edema, or other signs of acute infarction. There is no evidence of intracranial mass lesion or mass effect. No abnormal extraaxial fluid collections are identified.  Mild diffuse cerebral atrophy is stable. No evidence hydrocephalus. No other intracranial abnormality identified. No skull fracture or other bone lesion identified.  IMPRESSION: No acute intracranial findings.  Stable mild cerebral atrophy.   Electronically Signed   By: Myles Rosenthal M.D.   On: 06/13/2013 19:53    EKG Interpretation    Date/Time:  Thursday June 13 2013 19:09:47 EST Ventricular Rate:  100 PR Interval:  174 QRS Duration: 76 QT Interval:  354 QTC Calculation: 457 R Axis:   66 Text Interpretation:  Sinus tachycardia Borderline T wave abnormalities Artifact in lead(s) I II [name] aVR aVL aVF Poor data quality Confirmed by Nyquan Selbe  MD-J, Symia Herdt ([id number]) on 06/13/2013 9:08:55 PM            MDM   1. Anorexia    The patient is alert and oriented here in the emergency department.  She is not dehydrated. Her laboratory tests are reassuring. Patient was given oral fluids and foods.  At this time there does not appear to be any evidence of an acute emergency medical condition and the patient appears stable for discharge with appropriate outpatient follow up. Patient be transferred back to the nursing facility    Celene Kras, MD 06/13/13 2149

## 2013-06-13 NOTE — Procedures (Signed)
   [facility]  EEG (ELECTROENCEPHALOGRAM) REPORT   STUDY DATE: 06/12/13 PATIENT NAME: Joan [name] DOB: 03/11/1961 MRN: [id number]  ORDERING CLINICIAN: Delia HeadyPramod Sethi, MD   TECHNOLOGIST: Kaylyn LimSue Fox TECHNIQUE: Electroencephalogram was recorded utilizing standard 10-20 system of lead placement and reformatted into average and bipolar montages.  RECORDING TIME: 20 minutes ACTIVATION: photic stimulation  CLINICAL INFORMATION: 53 year old female with decreased responsiveness.  FINDINGS: Background rhythms of 6-7 hertz and 20-30 microvolts. Intermittent muscle and electrode artifact noted throughout the recording. The T5 electrode was replaced early in the study. Variable high frequency activity noted in the bitemporal regions, including A1 and A2 electrodes, likely muscle artifact. No focal, lateralizing, epileptiform activity or seizures are seen. Patient recorded in the awake state.  IMPRESSION:  Abnormal study in the awake state demonstrating mild-moderate diffuse slowing consistent with mild-moderate encephalopathy. No definite epileptiform activity or seizures are seen.   INTERPRETING PHYSICIAN:  Suanne MarkerVIKRAM R. Abigail Teall, MD Certified in Neurology, Neurophysiology and Neuroimaging  [facility] [address], [address] [place], [place] [address] [phone]

## 2013-06-13 NOTE — ED Notes (Signed)
Pt coming from [facility] for not eating for the last 4 days now.Pt alert x4, pt has a hx of seizure but staff did not report any activity recently.

## 2013-06-13 NOTE — ED Notes (Signed)
Patient alert with confusion. Patient DC instruction given to PTAR.  [facility]  V/S stable.  Patient transported back to her facility via [facility].  Patient was not showing any signs of distress on DC

## 2013-06-13 NOTE — Progress Notes (Signed)
   CARE MANAGEMENT ED NOTE 06/13/2013  Patient:  Candida PeelingHUDSON,Dalayah   Account Number:  [id number]  Date Initiated:  06/13/2013  Documentation initiated by:  Edd ArbourGIBBS,Lumina Gitto  Subjective/Objective Assessment:   53 yr old bcbs [place] [facility]cp listed [facility] sheets [facility] Murray HodgkinsArthur [place]     Subjective/Objective Assessment Detail:     Action/Plan:   EPIC updated   Action/Plan Detail:   Anticipated DC Date:  06/13/2013     Status Recommendation to Physician:   Result of Recommendation:    Other ED Services  Consult Working Plan    DC Planning Services  Other  PCP issues  Outpatient Services - Pt will follow up    Choice offered to / List presented to:            Status of service:  Completed, signed off  ED Comments:   ED Comments Detail:

## 2013-06-13 NOTE — ED Notes (Signed)
Bed: WA11 Expected date:  Expected time:  Means of arrival:  Comments: EMS-dehydration 

## 2013-06-14 ENCOUNTER — Other Ambulatory Visit: Payer: Self-pay | Admitting: Internal Medicine

## 2013-06-14 ENCOUNTER — Other Ambulatory Visit: Payer: Self-pay

## 2013-06-14 DIAGNOSIS — R638 Other symptoms and signs concerning food and fluid intake: Secondary | ICD-10-CM

## 2013-06-14 DIAGNOSIS — R627 Adult failure to thrive: Secondary | ICD-10-CM

## 2013-06-14 DIAGNOSIS — G609 Hereditary and idiopathic neuropathy, unspecified: Secondary | ICD-10-CM | POA: Insufficient documentation

## 2013-06-14 DIAGNOSIS — IMO0002 Reserved for concepts with insufficient information to code with codable children: Secondary | ICD-10-CM

## 2013-06-14 LAB — URINE CULTURE
Colony Count: NO GROWTH
Culture: NO GROWTH

## 2013-06-14 LAB — BASIC METABOLIC PANEL
ANION GAP: 8 (ref 7–16)
BUN: 10 mg/dL (ref 7–18)
CALCIUM: 8.1 mg/dL — AB (ref 8.5–10.1)
CHLORIDE: 104 mmol/L (ref 98–107)
Co2: 24 mmol/L (ref 21–32)
Creatinine: 0.94 mg/dL (ref 0.60–1.30)
EGFR (African American): >60
GLUCOSE: 74 mg/dL (ref 65–99)
OSMOLALITY: 270 (ref 275–301)
POTASSIUM: 3.5 mmol/L (ref 3.5–5.1)
SODIUM: 136 mmol/L (ref 136–145)

## 2013-06-14 NOTE — Progress Notes (Signed)
[facility] [address] [place]. [place] [id number] [phone]       OFFICE Consult NOTE  Ms. Joan PeelingLora [name] Date of Birth:  09/03/1960 Medical Record Number:  [id number]   Referring MD : Marlin CanaryJessica Vann, DO Reason : seizures HPI: [id number] year african american lady seen today accompanied by son. She is unable to give history which is obtained from son, review of hospital chary, lab tests and imaging studies which I personally reviewed.She was admitted on  05/11/13 with  Decrease responsiveness for 3 days along with frequent falls, nausea and vomitting. Lactic acid level on admission was 5 and she was diagnosed with UTI and treated with iv Rocephin and iv hydration  And was initially thought to have toxic metabolic encephalopathy but on 05/21/13  She was noted to be even less responsive with left gaze devaition and diagnosed with seizures and started on keppra and depacon.MRI showed diffusion positive lesions in medial thalami bilaterally worrisome for hypoxic injury versus CJD but EEG was negative.CSF suggested meningoencephalitis elevated protein and lymphocytic pleocytosis. Bacterial and viral csf studies were negative. CSF tau for 1433 protein was ordered but never got sent.24 hour EEG monitoing showed no recurrent seizures and only generalized slowing.Hospital course was complicated by pyelonephritis,, acute renal failure, c diff colitis all of which were treated. She showed some improvement in mentation bt remained bed bound and full assist and was transfrerred yo SNF for rehab where she is currently staying.Son state sshe remains full assist and spens most time in wheel chair and can barely recognize family.Her speech is mostly rambling and not in context. She can occasionally recognize family members.She had another bout of recurrent UTI growing klebsiella on 06/06/13. She has not had any more witnessed seizures.  ROS:   14 system review of systems is positive for  Fever, weight  loss,fatigue, blurred vision,memory loss,numbness, weakness,slurred speech,dizziness,seizure,passing out,depresssion,anxeity,sleepiness,disinterest,diarrhoea,frequent infections and all other systems negative  PMH:  Past Medical History  Diagnosis Date  . Peripheral neuropathy   . Fibromyalgia   . Hypertension   . Anxiety   . Catatonia     Social History:  History   Social History  . Marital Status: Single    Spouse Name: N/A    Number of Children: N/A  . Years of Education: N/A   Occupational History  . Not on file.   Social History Main Topics  . Smoking status: Never Smoker   . Smokeless tobacco: Not on file  . Alcohol Use: No  . Drug Use:   . Sexual Activity:    Other Topics Concern  . Not on file   Social History Narrative  . No narrative on file    Medications:   No current outpatient prescriptions on file prior to visit.   No current facility-administered medications on file prior to visit.    Allergies:   Allergies  Allergen Reactions  . Morphine And Related Other (See Comments)    REACTION: Causes pain  . Paroxetine Hcl Other (See Comments)    REACTION:  unknown  . Penicillins Other (See Comments)    REACTION: Makes her body hurt.  . Sertraline Hcl Other (See Comments)    REACTION:  unknown    Physical Exam General: obese middle aged african american lady seated, in no evident distress Head: head normocephalic and atraumatic. Orohparynx benign Neck: supple with no carotid or supraclavicular bruits Cardiovascular: regular rate and rhythm, no murmurs Musculoskeletal: no deformity Skin:  no rash/petichiae Vascular:  Normal pulses all  extremities Filed Vitals:   06/12/13 1341  BP: 117/84  Pulse: 99   Neurologic Exam Mental Status: Awake and fully alert.Disoriented to place and time. Recent and remote memory poor. Attention span, concentration and fund of knowledge  Very limited. Mood and affect appropriate. . No aphasia or dysarthria. Follow  1 step commands only. Poor effort on MMSE 12/30 Cranial Nerves: Fundoscopic exam reveals sharp disc margins. Pupils equal, briskly reactive to light. Extraocular movements full without nystagmus. Visual fields full to confrontation. Hearing intact. Facial sensation intact. Face, tongue, palate moves normally and symmetrically.  Motor: Normal bulk and tone. Normal strength in all tested extremity muscles except bilateral ffoot drop and ankle DF weak.tone increased in both legs Sensory.: i touch and pinprick and  vibratory sensation diminished   in bothfeet.  Coordination: Rapid alternating movements normal in all extremities. Finger-to-nose and heel-to-shin performed accurately bilaterally. Gait and Station: unable to get up and walk Reflexes: 1+ and symmetric. Toes downgoing.       ASSESSMENT: 77 year lady with prolonged hospitalization in December 2014 for altered mental status, seizures, status epilepticus, sepsis, dehydration, abnormal MRI  And spinal fluid worrisome for memingoencephalitis though extensive serological and csf testing was unyileding for definite etiology. She has not made significant  recovery which is concerning   PLAN: I had a long discussion with the patient's son regarding her recent illness, discussed results of imaging findings and lab test and answered questions. Continue Keppra and Depakote in the current dosages for seizures. Continue ongoing physical and occupational therapy. Check followup repeat MRI scan an EEG. If MRI is yet abnormal or shows prgressive findings may send anti NMDA receptor antibodies and admit for IVIG courseReturn for followup in 2 months with Larita Fife, NP or call earlier if necessary

## 2013-06-17 ENCOUNTER — Other Ambulatory Visit: Payer: Self-pay | Admitting: Neurology

## 2013-06-17 ENCOUNTER — Ambulatory Visit (HOSPITAL_COMMUNITY)
Admission: RE | Admit: 2013-06-17 | Discharge: 2013-06-17 | Disposition: A | Discharge status: Home / Self Care | Payer: BC Managed Care – PPO | Source: Ambulatory Visit | Attending: Internal Medicine | Admitting: Internal Medicine

## 2013-06-17 ENCOUNTER — Other Ambulatory Visit: Payer: Self-pay | Admitting: Internal Medicine

## 2013-06-17 ENCOUNTER — Other Ambulatory Visit (HOSPITAL_COMMUNITY): Payer: Self-pay | Admitting: Radiology

## 2013-06-17 ENCOUNTER — Ambulatory Visit (HOSPITAL_COMMUNITY)
Admission: RE | Admit: 2013-06-17 | Discharge: 2013-06-17 | Disposition: A | Discharge status: Home / Self Care | Payer: BC Managed Care – PPO | Source: Ambulatory Visit | Attending: Neurology | Admitting: Neurology

## 2013-06-17 DIAGNOSIS — R638 Other symptoms and signs concerning food and fluid intake: Secondary | ICD-10-CM | POA: Insufficient documentation

## 2013-06-17 DIAGNOSIS — G0490 Encephalitis and encephalomyelitis, unspecified: Secondary | ICD-10-CM

## 2013-06-17 DIAGNOSIS — R627 Adult failure to thrive: Secondary | ICD-10-CM | POA: Insufficient documentation

## 2013-06-18 ENCOUNTER — Emergency Department (HOSPITAL_COMMUNITY): Payer: BC Managed Care – PPO

## 2013-06-18 ENCOUNTER — Other Ambulatory Visit (HOSPITAL_COMMUNITY): Payer: BC Managed Care – PPO

## 2013-06-18 ENCOUNTER — Ambulatory Visit (HOSPITAL_COMMUNITY)
Admission: RE | Admit: 2013-06-18 | Discharge: 2013-06-18 | Disposition: A | Discharge status: Home / Self Care | Payer: BC Managed Care – PPO | Source: Ambulatory Visit | Attending: Internal Medicine | Admitting: Internal Medicine

## 2013-06-18 ENCOUNTER — Encounter (HOSPITAL_COMMUNITY): Payer: Self-pay | Admitting: Emergency Medicine

## 2013-06-18 ENCOUNTER — Other Ambulatory Visit: Payer: Self-pay

## 2013-06-18 ENCOUNTER — Emergency Department (HOSPITAL_COMMUNITY)
Admission: EM | Admit: 2013-06-18 | Discharge: 2013-06-18 | Disposition: A | Discharge status: Home / Self Care | Payer: BC Managed Care – PPO | Attending: Emergency Medicine | Admitting: Emergency Medicine

## 2013-06-18 ENCOUNTER — Telehealth: Payer: Self-pay | Admitting: Neurology

## 2013-06-18 ENCOUNTER — Ambulatory Visit (HOSPITAL_COMMUNITY): Payer: BC Managed Care – PPO

## 2013-06-18 DIAGNOSIS — E860 Dehydration: Secondary | ICD-10-CM | POA: Insufficient documentation

## 2013-06-18 DIAGNOSIS — IMO0002 Reserved for concepts with insufficient information to code with codable children: Secondary | ICD-10-CM

## 2013-06-18 DIAGNOSIS — R638 Other symptoms and signs concerning food and fluid intake: Secondary | ICD-10-CM

## 2013-06-18 LAB — VALPROIC ACID LEVEL: Valproic Acid Lvl: 31.4 ug/mL — ABNORMAL LOW (ref 50.0–100.0)

## 2013-06-18 LAB — COMPREHENSIVE METABOLIC PANEL
ALBUMIN: 3.1 g/dL — AB (ref 3.5–5.2)
ALK PHOS: 43 U/L (ref 39–117)
ALT: 11 U/L (ref 0–35)
AST: 30 U/L (ref 0–37)
BILIRUBIN TOTAL: 0.4 mg/dL (ref 0.3–1.2)
BUN: 8 mg/dL (ref 6–23)
CO2: 20 mEq/L (ref 19–32)
Calcium: 8.8 mg/dL (ref 8.4–10.5)
Chloride: 104 mEq/L (ref 96–112)
Creatinine, Ser: 0.88 mg/dL (ref 0.50–1.10)
GFR calc Af Amer: 87 mL/min — ABNORMAL LOW (ref >90)
GFR calc non Af Amer: 75 mL/min — ABNORMAL LOW (ref >90)
Glucose, Bld: 82 mg/dL (ref 70–99)
POTASSIUM: 4 meq/L (ref 3.7–5.3)
SODIUM: 141 meq/L (ref 137–147)
TOTAL PROTEIN: 6.7 g/dL (ref 6.0–8.3)

## 2013-06-18 LAB — CBC
HEMATOCRIT: 38.7 % (ref 36.0–46.0)
Hemoglobin: 12.9 g/dL (ref 12.0–15.0)
MCH: 31.8 pg (ref 26.0–34.0)
MCHC: 33.3 g/dL (ref 30.0–36.0)
MCV: 95.3 fL (ref 78.0–100.0)
PLATELETS: 129 10*3/uL — AB (ref 150–400)
RBC: 4.06 MIL/uL (ref 3.87–5.11)
RDW: 17.4 % — AB (ref 11.5–15.5)
WBC: 7 10*3/uL (ref 4.0–10.5)

## 2013-06-18 LAB — APTT: APTT: 20 s — AB (ref 24–37)

## 2013-06-18 LAB — PROTIME-INR
INR: 1.25 (ref 0.00–1.49)
PROTHROMBIN TIME: 15.4 s — AB (ref 11.6–15.2)

## 2013-06-18 MED ORDER — SODIUM CHLORIDE 0.9 % IV BOLUS (SEPSIS): 1000.0000 mL | Freq: Once | INTRAVENOUS | Status: DC

## 2013-06-18 MED ORDER — SODIUM CHLORIDE 0.9 % IV BOLUS (SEPSIS)
1000.0000 mL | Freq: Once | INTRAVENOUS | Status: AC
  Administered 2013-06-18: 1000 mL via INTRAVENOUS

## 2013-06-18 MED ORDER — VANCOMYCIN HCL IN DEXTROSE 1-5 GM/200ML-% IV SOLN: 1000.0000 mg | Freq: Once | INTRAVENOUS | Status: DC

## 2013-06-18 MED ORDER — SODIUM CHLORIDE 0.9 % IV SOLN: Freq: Once | INTRAVENOUS | Status: DC

## 2013-06-18 NOTE — Telephone Encounter (Signed)
Patient's son said that patient is in hospital now, and has been diagnosed with catatonia, not encephalititis. He is requesting to have [facility]

## 2013-06-18 NOTE — ED Notes (Signed)
[facility], was going to have gtube placed in short stay. HR was in 140s. Pt brought to ED. Pt alert, oriented to person only, pts baseline. Denies pain at present.

## 2013-06-18 NOTE — Telephone Encounter (Signed)
Patient's son called to inform Dr. Pearlean BrownieSethi that patient is in emergency room and the hospital has given her a different diagnosis. Patient's son would like some advice, please call.

## 2013-06-18 NOTE — Progress Notes (Signed)
RECEIVED REPORT FROM [facility] THAT CLIENT'S B/P ONCE ON AMBULANCE WAS 160/100 AND HR 140; ON ARRIVAL TO SHORT STAY HEART RATE 140. PAM TURPIN PA NOTIFIED OF VITAL SIGNS AND PER PAM WILL TRANSFER CLIENT TO ER; ER TRIAGE NURSE NOTIFIED OF CLIENT AND SHE WILL CALL ME WHEN ROOM AVAILABLE.

## 2013-06-18 NOTE — ED Notes (Signed)
Attempted Iv start x2. Unsuccessful, IV team paged.

## 2013-06-18 NOTE — Progress Notes (Signed)
Joan [name] NOTIFIED CLIENT WILL BE COMING TO ER AND HE IS IN ER WAITING AREA; LIZ NURSE AT [name] NOTIFIED CLIENT IS GOING TO ER

## 2013-06-18 NOTE — Procedures (Signed)
Successful placement of right brachial vein approach 36 cm dual lumen PICC line with tip at the superior caval-atrial junction.  The PICC line is ready for immediate use.

## 2013-06-18 NOTE — Progress Notes (Signed)
COLOR PALE ORIENTED ONLY TO PERSON

## 2013-06-18 NOTE — Progress Notes (Signed)
Patient ID: Joan [name], female   DOB: 06/13/1960, 53 y.o.   MRN: [id number]  Pt was scheduled for perc G tube in IR today. Dehydration; FTT [facility] BP was extremely high: 160/100  and pulse rate of 140. Pt is agitated and confused transferred to ER for evaluation  Pt in ER-B16  Has been "cleared for procedure" per ER MD Pt sees Dr Pearlean BrownieSethi for sz disorder  Unfortunately, pt has hx of gastric bypass which means she is not a candidate for percutaneous gastric tube that was scheduled today in IR. Procedure was scheduled as an OP and history was not known when appt was made.  I have discussed with ER Dr Denton LankSteinl I have also informed pts son Her son does relate that she actually had gastric bypass surgery in 2010; reversed in 2012 and gastric sleeve procedure was performed same time.  Dr Grace IsaacWatts in IR has reviewed case and still feels she is not a candidate for percutaneous gastric tube placement.  Rec: Endo consult or surgical consult for G tube  I have been unable to reach pts PMD at this time. Will continue to reach that office

## 2013-06-18 NOTE — Discharge Instructions (Signed)
You had a PICC line placed today, which your doctors can use to give you iv fluids and/or medication, as needed. Due to having prior gastric bypass surgery, the IR team indicates they cannot place a G tube - follow up with your primary care doctor in the next 1-2 days - discuss this with them, as well as their long term plan for nutrition.  Also discuss with them the PICC line, and have them arrange to discontinue that line as soon as no longer needed.   Follow up with your primary care doctor in the next couple days for recheck.  Return to ER if worse, new symptoms, fevers, severe pain, persistent vomiting, change in mental status, other concern.     PICC Home Guide A peripherally inserted central catheter (PICC) is a long, thin, flexible tube that is inserted into a vein in the upper arm. It is a form of intravenous (IV) access. It is considered to be a "central" line because the tip of the PICC ends in a large vein in your chest. This large vein is called the superior vena cava (SVC). The PICC tip ends in the SVC because there is a lot of blood flow in the SVC. This allows medicines and IV fluids to be quickly distributed throughout the body. The PICC is inserted using a sterile technique by a specially trained nurse or physician. After the PICC is inserted, a chest X-ray exam is done to be sure it is in the correct place.  A PICC may be placed for different reasons, such as:  To give medicines and liquid nutrition that can only be given through a central line. Examples are:  Certain antibiotic treatments.  Chemotherapy.  Total parenteral nutrition (TPN).  To take frequent blood samples.  To give IV fluids and blood products.  If there is difficulty placing a peripheral intravenous (PIV) catheter. If taken care of properly, a PICC can remain in place for several months. A PICC can also allow a person to go home from the hospital early. Medicine and PICC care can be managed at home by a  family member or home health care team. WHAT PROBLEMS CAN HAPPEN WHEN I HAVE A PICC? Problems with a PICC can occasionally occur. These may include:  A blood clot (thrombus) forming in or at the tip of the PICC. This can cause the PICC to become clogged. A clot-dissolving medicine called tissue plasminogen activator (tPA) can be given through the PICC to help break up the clot.  Inflammation of the vein (phlebitis) in which the PICC is placed. Signs of inflammation may include redness, pain at the insertion site, red streaks, or being able to feel a "cord" in the vein where the PICC is located.  Infection in the PICC or at the insertion site. Signs of infection may include fever, chills, redness, swelling, or pus drainage from the PICC insertion site.  PICC movement (malposition). The PICC tip may move from its original position due to excessive physical activity, forceful coughing, sneezing, or vomiting.  A break or cut in the PICC. It is important to not use scissors near the PICC.  Nerve or tendon irritation or injury during PICC insertion. WHAT SHOULD I KEEP IN MIND ABOUT ACTIVITIES WHEN I HAVE A PICC?  You may bend your arm and move it freely. If your PICC is near or at the bend of your elbow, avoid activity with repeated motion at the elbow.  Rest at home for the remainder of the  day following PICC line insertion.  Avoid lifting heavy objects as instructed by your health care provider.  Avoid using a crutch with the arm on the same side as your PICC. You may need to use a walker. WHAT SHOULD I KNOW ABOUT MY PICC DRESSING?  Keep your PICC bandage (dressing) clean and dry to prevent infection.  Ask your health care provider when you may shower. Ask your health care provider to teach you how to wrap the PICC when you do take a shower.  Change the PICC dressing as instructed by your health care provider.  Change your PICC dressing if it becomes loose or wet. WHAT SHOULD I KNOW  ABOUT PICC CARE?  Check the PICC insertion site daily for leakage, redness, swelling, or pain.  Do not take a bath, swim, or use hot tubs when you have a PICC. Cover PICC line with clear plastic wrap and tape to keep it dry while showering.  Flush the PICC as directed by your health care provider. Let your health care provider know right away if the PICC is difficult to flush or does not flush. Do not use force to flush the PICC.  Do not use a syringe that is less than 10 mL to flush the PICC.  Never pull or tug on the PICC.  Avoid blood pressure checks on the arm with the PICC.  Keep your PICC identification card with you at all times.  Do not take the PICC out yourself. Only a trained clinical professional should remove the PICC. SEEK IMMEDIATE MEDICAL CARE IF:  Your PICC is accidently pulled all the way out. If this happens, cover the insertion site with a bandage or gauze dressing. Do not throw the PICC away. Your health care provider will need to inspect it.  Your PICC was tugged or pulled and has partially come out. Do not  push the PICC back in.  There is any type of drainage, redness, or swelling where the PICC enters the skin.  You cannot flush the PICC, it is difficult to flush, or the PICC leaks around the insertion site when it is flushed.  You hear a "flushing" sound when the PICC is flushed.  You have pain, discomfort, or numbness in your arm, shoulder, or jaw on the same side as the PICC.  You feel your heart "racing" or skipping beats.  You notice a hole or tear in the PICC.  You develop chills or a fever. MAKE SURE YOU:   Understand these instructions.  Will watch your condition.  Will get help right away if you are not doing well or get worse. Document Released: 11/20/2002 Document Revised: 03/06/2013 Document Reviewed: 01/21/2013 [facility] Patient Information 2014 [place], [place].     Dehydration, Adult Dehydration is when you lose more fluids from  the body than you take in. Vital organs like the kidneys, brain, and heart cannot function without a proper amount of fluids and salt. Any loss of fluids from the body can cause dehydration.  CAUSES   Vomiting.  Diarrhea.  Excessive sweating.  Excessive urine output.  Fever. SYMPTOMS  Mild dehydration  Thirst.  Dry lips.  Slightly dry mouth. Moderate dehydration  Very dry mouth.  Sunken eyes.  Skin does not bounce back quickly when lightly pinched and released.  Dark urine and decreased urine production.  Decreased tear production.  Headache. Severe dehydration  Very dry mouth.  Extreme thirst.  Rapid, weak pulse (more than 100 beats per minute at rest).  Cold hands and feet.  Not able to sweat in spite of heat and temperature.  Rapid breathing.  Blue lips.  Confusion and lethargy.  Difficulty being awakened.  Minimal urine production.  No tears. DIAGNOSIS  Your caregiver will diagnose dehydration based on your symptoms and your exam. Blood and urine tests will help confirm the diagnosis. The diagnostic evaluation should also identify the cause of dehydration. TREATMENT  Treatment of mild or moderate dehydration can often be done at home by increasing the amount of fluids that you drink. It is best to drink small amounts of fluid more often. Drinking too much at one time can make vomiting worse. Refer to the home care instructions below. Severe dehydration needs to be treated at the hospital where you will probably be given intravenous (IV) fluids that contain water and electrolytes. HOME CARE INSTRUCTIONS   Ask your caregiver about specific rehydration instructions.  Drink enough fluids to keep your urine clear or pale yellow.  Drink small amounts frequently if you have nausea and vomiting.  Eat as you normally do.  Avoid:  Foods or drinks high in sugar.  Carbonated drinks.  Juice.  Extremely hot or cold fluids.  Drinks with  caffeine.  Fatty, greasy foods.  Alcohol.  Tobacco.  Overeating.  Gelatin desserts.  Wash your hands well to avoid spreading bacteria and viruses.  Only take over-the-counter or prescription medicines for pain, discomfort, or fever as directed by your caregiver.  Ask your caregiver if you should continue all prescribed and over-the-counter medicines.  Keep all follow-up appointments with your caregiver. SEEK MEDICAL CARE IF:  You have abdominal pain and it increases or stays in one area (localizes).  You have a rash, stiff neck, or severe headache.  You are irritable, sleepy, or difficult to awaken.  You are weak, dizzy, or extremely thirsty. SEEK IMMEDIATE MEDICAL CARE IF:   You are unable to keep fluids down or you get worse despite treatment.  You have frequent episodes of vomiting or diarrhea.  You have blood or green matter (bile) in your vomit.  You have blood in your stool or your stool looks black and tarry.  You have not urinated in 6 to 8 hours, or you have only urinated a small amount of very dark urine.  You have a fever.  You faint. MAKE SURE YOU:   Understand these instructions.  Will watch your condition.  Will get help right away if you are not doing well or get worse. Document Released: 05/16/2005 Document Revised: 08/08/2011 Document Reviewed: 01/03/2011 [facility] Patient Information 2014 [place], [place].

## 2013-06-18 NOTE — ED Notes (Signed)
IV team contacted for PICC insert. States pt will need to go to IR for insertion.

## 2013-06-18 NOTE — ED Provider Notes (Signed)
CSN: [id number]     Arrival date & time 06/18/13  1045 History   First MD Initiated Contact with Patient 06/18/13 1100     Chief Complaint  Patient presents with  . Tachycardia   (Consider location/radiation/quality/duration/timing/severity/associated sxs/prior Treatment) The history is provided by the patient.  pt w hx fibromyalgia, htn, anxiety, catatonia, presents from [facility] - pt had poor po intake, was to have feeding tube placed. In short stay, pt anxious, also noted w hr 120's, so sent to ED.  Pt denies any new c/o. Denies pain. No headache. No cp. No abd pain. No vomiting or diarrhea. No gu c/o. No fever or chills. Denies cough, sore throat, or uri c/o. No sob.        Past Medical History  Diagnosis Date  . Peripheral neuropathy   . Fibromyalgia   . Hypertension   . Anxiety   . Catatonia    Past Surgical History  Procedure Laterality Date  . Gastric bypass    . Abdominal hysterectomy     Family History  Problem Relation Age of Onset  . Hypertension Mother   . Diabetes Father    History  Substance Use Topics  . Smoking status: Never Smoker   . Smokeless tobacco: Not on file  . Alcohol Use: No   OB History   Grav Para Term Preterm Abortions TAB SAB Ect Mult Living                 Review of Systems  Constitutional: Negative for fever and chills.  HENT: Negative for sore throat.   Eyes: Negative for redness.  Respiratory: Negative for shortness of breath.   Cardiovascular: Negative for chest pain.  Gastrointestinal: Negative for vomiting, abdominal pain and diarrhea.  Genitourinary: Negative for dysuria and flank pain.  Musculoskeletal: Negative for back pain and neck pain.  Skin: Negative for rash.  Neurological: Negative for headaches.  Hematological: Does not bruise/bleed easily.  Psychiatric/Behavioral: The patient is nervous/anxious.     Allergies  Morphine and related; Paroxetine hcl; Penicillins; and Sertraline hcl  Home Medications   Current  Outpatient Rx  Name  Route  Sig  Dispense  Refill  . acetaminophen (TYLENOL) 325 MG tablet   Oral   Take 650 mg by mouth every 4 (four) hours as needed for mild pain or fever.         [name]. atenolol (TENORMIN) 25 MG tablet   Oral   Take 12.5 mg by mouth daily.          . chlorhexidine (PERIDEX) 0.12 % solution   Mouth/Throat   Use as directed 15 mLs in the mouth or throat 2 (two) times daily.         . feeding supplement, RESOURCE BREEZE, (RESOURCE BREEZE) LIQD   Oral   Take 1 Container by mouth 2 (two) times daily between meals.         . folic acid (FOLVITE) 1 MG tablet   Oral   Take 1 mg by mouth daily.         [name]. levETIRAcetam (KEPPRA) 1000 MG tablet   Oral   Take 1,000 mg by mouth 2 (two) times daily.         . megestrol (MEGACE) 400 MG/10ML suspension   Oral   Take 400 mg by mouth daily.         . Nutritional Supplements (NUTRITIONAL SHAKE) LIQD   Oral   Take 1 Can by mouth 2 (two) times daily between  meals. Mighty shakes.         [name] thiamine (VITAMIN B-1) 100 MG tablet   Oral   Take 100 mg by mouth daily.         . Valproic Acid (DEPAKENE) 250 MG/5ML SYRP syrup   Oral   Take 250-500 mg by mouth 3 (three) times daily. Give 500 mg every morning, 250 mg at noon, and 500 mg at bedtime for seizure disorder.         . loperamide (IMODIUM) 2 MG capsule   Oral   Take 2 mg by mouth as needed for diarrhea or loose stools.         . methocarbamol (ROBAXIN) 500 MG tablet   Oral   Take 500 mg by mouth every 6 (six) hours as needed for muscle spasms.         . mirtazapine (REMERON SOL-TAB) 30 MG disintegrating tablet   Oral   Take 30 mg by mouth at bedtime.         . Multiple Vitamins-Minerals (ELDERTONIC PO)   Oral   Take 15 mLs by mouth 2 (two) times daily.         . potassium chloride 20 MEQ/15ML (10%) solution   Oral   Take 20 mEq by mouth daily. For 5 days.         [name] sulfamethoxazole-trimethoprim (BACTRIM DS) 800-160 MG per tablet    Oral   Take 1 tablet by mouth 2 (two) times daily. For 7 days. For UTI.          BP 138/95  Pulse 124  Temp(Src) 98.5 F (36.9 C) (Oral)  Resp 16  SpO2 100% Physical Exam  Nursing note and vitals reviewed. Constitutional: She is oriented to person, place, and time. She appears well-developed and well-nourished. No distress.  HENT:  Nose: Nose normal.  Mouth/Throat: Oropharynx is clear and moist.  Eyes: Conjunctivae are normal. Pupils are equal, round, and reactive to light. No scleral icterus.  Neck: Neck supple. No tracheal deviation present. No thyromegaly present.  Cardiovascular: Normal heart sounds and intact distal pulses.  Exam reveals no gallop and no friction rub.   No murmur heard. Pulmonary/Chest: Effort normal and breath sounds normal. No respiratory distress.  Abdominal: Soft. Normal appearance and bowel sounds are normal. She exhibits no distension and no mass. There is no tenderness. There is no rebound and no guarding.  Genitourinary:  No cva tenderness  Musculoskeletal: She exhibits no edema and no tenderness.  Neurological: She is alert and oriented to person, place, and time.  Skin: Skin is warm and dry. No rash noted. She is not diaphoretic.  Psychiatric:  anxious    ED Course  Procedures (including critical care time)  Results for orders placed during the hospital encounter of 06/18/13  COMPREHENSIVE METABOLIC PANEL      Result Value Range   Sodium 141  137 - 147 mEq/L   Potassium 4.0  3.7 - 5.3 mEq/L   Chloride 104  96 - 112 mEq/L   CO2 20  19 - 32 mEq/L   Glucose, Bld 82  70 - 99 mg/dL   BUN 8  6 - 23 mg/dL   Creatinine, Ser 7.82  0.50 - 1.10 mg/dL   Calcium 8.8  8.4 - 95.6 mg/dL   Total Protein 6.7  6.0 - 8.3 g/dL   Albumin 3.1 (*) 3.5 - 5.2 g/dL   AST 30  0 - 37 U/L   ALT 11  0 -  35 U/L   Alkaline Phosphatase 43  39 - 117 U/L   Total Bilirubin 0.4  0.3 - 1.2 mg/dL   GFR calc non Af Amer 75 (*) >90 mL/min   GFR calc Af Amer 87 (*) >90  mL/min  CBC      Result Value Range   WBC 7.0  4.0 - 10.5 K/uL   RBC 4.06  3.87 - 5.11 MIL/uL   Hemoglobin 12.9  12.0 - 15.0 g/dL   HCT 16.1  09.6 - 04.5 %   MCV 95.3  78.0 - 100.0 fL   MCH 31.8  26.0 - 34.0 pg   MCHC 33.3  30.0 - 36.0 g/dL   RDW 40.9 (*) 81.1 - 91.4 %   Platelets 129 (*) 150 - 400 K/uL  VALPROIC ACID LEVEL      Result Value Range   Valproic Acid Lvl 31.4 (*) 50.0 - 100.0 ug/mL      Date: 06/18/2013  Rate: 117  Rhythm: sinus tachycardia  QRS Axis: normal  Intervals: normal  ST/T Wave abnormalities: normal  Conduction Disutrbances:none  Narrative Interpretation:   Old EKG Reviewed: changes noted Poor quality ecg/background  - no gross, acute st/t changes    MDM  Iv ns bolus.  Labs.  Pt was scheduled to have g tube per IR - spoke with IR, they state regardless of HR issue, as pt has prior gastric bypass, not a candidate or IR g tube.  They indicate was erroneously scheduled, possibly due to communication error when scheduled by pcp.  PICC IV placed.  Iv ns bolus.  Hr 96 on recheck. No nvd while in ed. Po fluids.   pcp called - not available, but discussed case with on call provider for that group, Dr Leanord Hawking.  He requests we leave PICC line for now, d/c back to [facility], they will use at ecf if needed for ivf, and he indicates they will decide on definitive plan should they want/need to pursue feeding tube.   Recheck pt. abd soft nt. Afeb. Tolerating po. Appears stable for d/c to ecf.     Suzi Roots, MD 06/18/13 [phone]

## 2013-06-18 NOTE — Telephone Encounter (Signed)
ok 

## 2013-06-19 ENCOUNTER — Inpatient Hospital Stay (HOSPITAL_COMMUNITY)
Admission: AD | Admit: 2013-06-19 | Discharge: 2013-07-16 | DRG: 981 | Disposition: A | Discharge status: Skilled Nursing Facility | Payer: Medicare Other | Source: Ambulatory Visit | Attending: Internal Medicine | Admitting: Internal Medicine

## 2013-06-19 DIAGNOSIS — N390 Urinary tract infection, site not specified: Secondary | ICD-10-CM | POA: Diagnosis not present

## 2013-06-19 DIAGNOSIS — K9403 Colostomy malfunction: Secondary | ICD-10-CM | POA: Diagnosis not present

## 2013-06-19 DIAGNOSIS — E872 Acidosis, unspecified: Secondary | ICD-10-CM

## 2013-06-19 DIAGNOSIS — F411 Generalized anxiety disorder: Secondary | ICD-10-CM | POA: Diagnosis present

## 2013-06-19 DIAGNOSIS — R6520 Severe sepsis without septic shock: Secondary | ICD-10-CM

## 2013-06-19 DIAGNOSIS — G0491 Myelitis, unspecified: Principal | ICD-10-CM

## 2013-06-19 DIAGNOSIS — G0490 Encephalitis and encephalomyelitis, unspecified: Principal | ICD-10-CM | POA: Diagnosis present

## 2013-06-19 DIAGNOSIS — F3289 Other specified depressive episodes: Secondary | ICD-10-CM | POA: Diagnosis present

## 2013-06-19 DIAGNOSIS — H5316 Psychophysical visual disturbances: Secondary | ICD-10-CM | POA: Diagnosis present

## 2013-06-19 DIAGNOSIS — F29 Unspecified psychosis not due to a substance or known physiological condition: Secondary | ICD-10-CM

## 2013-06-19 DIAGNOSIS — F329 Major depressive disorder, single episode, unspecified: Secondary | ICD-10-CM | POA: Diagnosis present

## 2013-06-19 DIAGNOSIS — D509 Iron deficiency anemia, unspecified: Secondary | ICD-10-CM | POA: Diagnosis present

## 2013-06-19 DIAGNOSIS — R0902 Hypoxemia: Secondary | ICD-10-CM | POA: Diagnosis not present

## 2013-06-19 DIAGNOSIS — G609 Hereditary and idiopathic neuropathy, unspecified: Secondary | ICD-10-CM | POA: Diagnosis present

## 2013-06-19 DIAGNOSIS — K9413 Enterostomy malfunction: Secondary | ICD-10-CM | POA: Diagnosis not present

## 2013-06-19 DIAGNOSIS — Z79899 Other long term (current) drug therapy: Secondary | ICD-10-CM

## 2013-06-19 DIAGNOSIS — B961 Klebsiella pneumoniae [K. pneumoniae] as the cause of diseases classified elsewhere: Secondary | ICD-10-CM | POA: Diagnosis not present

## 2013-06-19 DIAGNOSIS — K560 Paralytic ileus: Secondary | ICD-10-CM | POA: Diagnosis not present

## 2013-06-19 DIAGNOSIS — G9340 Encephalopathy, unspecified: Secondary | ICD-10-CM

## 2013-06-19 DIAGNOSIS — Z6832 Body mass index (BMI) 32.0-32.9, adult: Secondary | ICD-10-CM

## 2013-06-19 DIAGNOSIS — G929 Unspecified toxic encephalopathy: Secondary | ICD-10-CM | POA: Diagnosis present

## 2013-06-19 DIAGNOSIS — R4020 Unspecified coma: Secondary | ICD-10-CM | POA: Diagnosis present

## 2013-06-19 DIAGNOSIS — I498 Other specified cardiac arrhythmias: Secondary | ICD-10-CM | POA: Diagnosis not present

## 2013-06-19 DIAGNOSIS — Z9049 Acquired absence of other specified parts of digestive tract: Secondary | ICD-10-CM

## 2013-06-19 DIAGNOSIS — Y9210 Unspecified residential institution as the place of occurrence of the external cause: Secondary | ICD-10-CM | POA: Diagnosis not present

## 2013-06-19 DIAGNOSIS — Y833 Surgical operation with formation of external stoma as the cause of abnormal reaction of the patient, or of later complication, without mention of misadventure at the time of the procedure: Secondary | ICD-10-CM | POA: Diagnosis not present

## 2013-06-19 DIAGNOSIS — A419 Sepsis, unspecified organism: Secondary | ICD-10-CM | POA: Diagnosis not present

## 2013-06-19 DIAGNOSIS — A048 Other specified bacterial intestinal infections: Secondary | ICD-10-CM

## 2013-06-19 DIAGNOSIS — Z9884 Bariatric surgery status: Secondary | ICD-10-CM | POA: Diagnosis not present

## 2013-06-19 DIAGNOSIS — R197 Diarrhea, unspecified: Secondary | ICD-10-CM

## 2013-06-19 DIAGNOSIS — D696 Thrombocytopenia, unspecified: Secondary | ICD-10-CM | POA: Diagnosis present

## 2013-06-19 DIAGNOSIS — R569 Unspecified convulsions: Secondary | ICD-10-CM | POA: Diagnosis present

## 2013-06-19 DIAGNOSIS — E876 Hypokalemia: Secondary | ICD-10-CM

## 2013-06-19 DIAGNOSIS — E440 Moderate protein-calorie malnutrition: Secondary | ICD-10-CM | POA: Diagnosis present

## 2013-06-19 DIAGNOSIS — Z993 Dependence on wheelchair: Secondary | ICD-10-CM

## 2013-06-19 DIAGNOSIS — D529 Folate deficiency anemia, unspecified: Secondary | ICD-10-CM | POA: Diagnosis present

## 2013-06-19 DIAGNOSIS — R4182 Altered mental status, unspecified: Secondary | ICD-10-CM

## 2013-06-19 DIAGNOSIS — E870 Hyperosmolality and hypernatremia: Secondary | ICD-10-CM

## 2013-06-19 DIAGNOSIS — G40909 Epilepsy, unspecified, not intractable, without status epilepticus: Secondary | ICD-10-CM | POA: Diagnosis present

## 2013-06-19 DIAGNOSIS — R6521 Severe sepsis with septic shock: Secondary | ICD-10-CM | POA: Diagnosis not present

## 2013-06-19 DIAGNOSIS — I10 Essential (primary) hypertension: Secondary | ICD-10-CM | POA: Diagnosis present

## 2013-06-19 DIAGNOSIS — R29818 Other symptoms and signs involving the nervous system: Secondary | ICD-10-CM | POA: Diagnosis present

## 2013-06-19 DIAGNOSIS — G92 Toxic encephalopathy: Secondary | ICD-10-CM | POA: Diagnosis present

## 2013-06-19 DIAGNOSIS — IMO0001 Reserved for inherently not codable concepts without codable children: Secondary | ICD-10-CM | POA: Diagnosis present

## 2013-06-19 DIAGNOSIS — R112 Nausea with vomiting, unspecified: Secondary | ICD-10-CM | POA: Diagnosis present

## 2013-06-19 DIAGNOSIS — E860 Dehydration: Secondary | ICD-10-CM

## 2013-06-19 DIAGNOSIS — G928 Other toxic encephalopathy: Secondary | ICD-10-CM | POA: Diagnosis present

## 2013-06-19 DIAGNOSIS — N179 Acute kidney failure, unspecified: Secondary | ICD-10-CM

## 2013-06-19 HISTORY — DX: Unspecified convulsions: R56.9

## 2013-06-19 LAB — COMPREHENSIVE METABOLIC PANEL
ALBUMIN: 2.6 g/dL — AB (ref 3.5–5.2)
ALT: 9 U/L (ref 0–35)
AST: 23 U/L (ref 0–37)
Alkaline Phosphatase: 35 U/L — ABNORMAL LOW (ref 39–117)
BUN: 6 mg/dL (ref 6–23)
CO2: 21 mEq/L (ref 19–32)
Calcium: 8.3 mg/dL — ABNORMAL LOW (ref 8.4–10.5)
Chloride: 109 mEq/L (ref 96–112)
Creatinine, Ser: 0.64 mg/dL (ref 0.50–1.10)
GFR calc Af Amer: >90 mL/min (ref >90)
GFR calc non Af Amer: >90 mL/min (ref >90)
Glucose, Bld: 94 mg/dL (ref 70–99)
Potassium: 3.6 mEq/L — ABNORMAL LOW (ref 3.7–5.3)
SODIUM: 142 meq/L (ref 137–147)
TOTAL PROTEIN: 5.9 g/dL — AB (ref 6.0–8.3)
Total Bilirubin: 0.4 mg/dL (ref 0.3–1.2)

## 2013-06-19 LAB — CBC WITH DIFFERENTIAL/PLATELET
BAND NEUTROPHILS: 0 % (ref 0–10)
BASOS ABS: 0 10*3/uL (ref 0.0–0.1)
BASOS PCT: 0 % (ref 0–1)
Blasts: 0 %
Eosinophils Absolute: 0 10*3/uL (ref 0.0–0.7)
Eosinophils Relative: 0 % (ref 0–5)
HEMATOCRIT: 34.8 % — AB (ref 36.0–46.0)
HEMOGLOBIN: 11.2 g/dL — AB (ref 12.0–15.0)
LYMPHS ABS: 1.7 10*3/uL (ref 0.7–4.0)
LYMPHS PCT: 33 % (ref 12–46)
MCH: 31.2 pg (ref 26.0–34.0)
MCHC: 32.2 g/dL (ref 30.0–36.0)
MCV: 96.9 fL (ref 78.0–100.0)
MYELOCYTES: 0 %
Metamyelocytes Relative: 0 %
Monocytes Absolute: 0.5 10*3/uL (ref 0.1–1.0)
Monocytes Relative: 10 % (ref 3–12)
Neutro Abs: 2.8 10*3/uL (ref 1.7–7.7)
Neutrophils Relative %: 57 % (ref 43–77)
Platelets: 113 10*3/uL — ABNORMAL LOW (ref 150–400)
Promyelocytes Absolute: 0 %
RBC: 3.59 MIL/uL — ABNORMAL LOW (ref 3.87–5.11)
RDW: 17.6 % — ABNORMAL HIGH (ref 11.5–15.5)
WBC: 5 10*3/uL (ref 4.0–10.5)
nRBC: 0 /100 WBC

## 2013-06-19 LAB — AMMONIA: Ammonia: 24 umol/L (ref 11–60)

## 2013-06-19 LAB — MRSA PCR SCREENING: MRSA by PCR: NEGATIVE

## 2013-06-19 LAB — VALPROIC ACID LEVEL: VALPROIC ACID LVL: 35.1 ug/mL — AB (ref 50.0–100.0)

## 2013-06-19 MED ORDER — SODIUM CHLORIDE 0.9 % IJ SOLN
3.0000 mL | Freq: Two times a day (BID) | INTRAMUSCULAR | Status: DC
  Administered 2013-06-24: 3 mL via INTRAVENOUS

## 2013-06-19 MED ORDER — MIRTAZAPINE 30 MG PO TBDP
30.0000 mg | ORAL_TABLET | Freq: Every day | ORAL | Status: DC
  Administered 2013-06-19 – 2013-07-10 (×20): 30 mg via ORAL
  Filled 2013-06-19 (×28): qty 1

## 2013-06-19 MED ORDER — VITAMIN B-1 100 MG PO TABS
100.0000 mg | ORAL_TABLET | Freq: Every day | ORAL | Status: DC
  Administered 2013-06-19 – 2013-07-01 (×8): 100 mg via ORAL
  Filled 2013-06-19 (×15): qty 1

## 2013-06-19 MED ORDER — LEVETIRACETAM 500 MG PO TABS
1000.0000 mg | ORAL_TABLET | Freq: Two times a day (BID) | ORAL | Status: DC
  Administered 2013-06-19 – 2013-06-26 (×14): 1000 mg via ORAL
  Filled 2013-06-19 (×17): qty 2

## 2013-06-19 MED ORDER — ACETAMINOPHEN 325 MG PO TABS
650.0000 mg | ORAL_TABLET | Freq: Four times a day (QID) | ORAL | Status: DC | PRN
  Administered 2013-06-19 – 2013-06-24 (×8): 650 mg via ORAL
  Filled 2013-06-19 (×7): qty 2

## 2013-06-19 MED ORDER — HEPARIN SODIUM (PORCINE) 5000 UNIT/ML IJ SOLN
5000.0000 [IU] | Freq: Three times a day (TID) | INTRAMUSCULAR | Status: DC
  Administered 2013-06-19 – 2013-06-23 (×10): 5000 [IU] via SUBCUTANEOUS
  Filled 2013-06-19 (×15): qty 1

## 2013-06-19 MED ORDER — SODIUM CHLORIDE 0.9 % IV SOLN
500.0000 mg | Freq: Two times a day (BID) | INTRAVENOUS | Status: DC
  Administered 2013-06-19 – 2013-06-23 (×8): 500 mg via INTRAVENOUS
  Filled 2013-06-19 (×9): qty 4

## 2013-06-19 MED ORDER — SODIUM CHLORIDE 0.9 % IJ SOLN
10.0000 mL | INTRAMUSCULAR | Status: DC | PRN
  Administered 2013-06-19: 30 mL
  Administered 2013-06-19 – 2013-06-23 (×6): 10 mL
  Administered 2013-07-08: 20 mL
  Administered 2013-07-09 – 2013-07-16 (×6): 10 mL

## 2013-06-19 MED ORDER — VALPROIC ACID 250 MG PO CAPS
500.0000 mg | ORAL_CAPSULE | Freq: Two times a day (BID) | ORAL | Status: DC
  Administered 2013-06-19 – 2013-06-20 (×2): 500 mg via ORAL
  Filled 2013-06-19 (×3): qty 2

## 2013-06-19 MED ORDER — VALPROIC ACID 250 MG PO CAPS: 250.0000 mg | ORAL_CAPSULE | Freq: Three times a day (TID) | ORAL | Status: DC

## 2013-06-19 MED ORDER — VALPROIC ACID 250 MG PO CAPS
250.0000 mg | ORAL_CAPSULE | Freq: Every day | ORAL | Status: DC
  Filled 2013-06-19: qty 1

## 2013-06-19 MED ORDER — ATENOLOL 12.5 MG HALF TABLET
12.5000 mg | ORAL_TABLET | Freq: Every day | ORAL | Status: DC
  Administered 2013-06-19 – 2013-06-22 (×4): 12.5 mg via ORAL
  Filled 2013-06-19 (×5): qty 1

## 2013-06-19 NOTE — Telephone Encounter (Addendum)
[facility] relayed that pt was having PT/OT from 05/28/13  and due to no progress discharged her at this time 06/17/13.  She stated if pt shows changes then can be reinstated.  [phone].  Pt is no longer at the hospital.  [facility]

## 2013-06-19 NOTE — Progress Notes (Signed)
Patient Active Problem List   Diagnosis Date Noted  . Unspecified hereditary and idiopathic peripheral neuropathy 06/14/2013  . Diarrhea 06/05/2013  . Essential hypertension, benign 05/31/2013  . Hypokalemia 05/25/2013  . Hypomagnesemia 05/25/2013  . Clostridial gastroenteritis 05/20/2013  . Convulsions/seizures 05/20/2013  . UTI (lower urinary tract infection) 05/11/2013  . Sepsis 05/11/2013  . Acute encephalopathy 05/11/2013  . Dehydration 05/11/2013  . Hypernatremia 05/11/2013  . Metabolic acidosis 05/11/2013  . AKI (acute kidney injury) 05/11/2013   53 year old with past medical history of toxic metabolic encephalopathy and sepsis recently treated hospital, was seen by neurologist on that admission was treated for status epilepticus. She was started on IV Keppra and Ativan which improved his seizure. EEG showed abnormal with moderately severe continues generalized nonspecific slowing of cerebral activity. A lumbar puncture was done that showed encephalitis (as shows 26 with glucose) with MRI or further concerns of hypoxia and versus CJD. Was sent to skilled nursing facility but has not improved so neurology requested to be admitted for further workup.

## 2013-06-19 NOTE — H&P (Addendum)
[facility] History and Physical  Nidia Grogan ALP:[id number] DOB: 02/23/1961 DOA: 06/19/2013  Referring physician: Dellia Nims PCP: Estill Dooms, MD  Specialists: Neurology  Chief Complaint:   HPI: Walterine Amodei is a 53 y.o. female who was accepted to a MED-Surg floor from [facility] on 06/19/2013. On review of chart it is noted that patient was scheduled for percutaneous G-tube placement on 1/20 patient was agitated confused and was sent to the emergency room for evaluation of sinus tachycardia and hypertensive urgency-a PICC line was placed IV saline bolus was given.  It is noted that she was admitted recently 12/13-12/28 with toxic metabolic encephalopathy-CSF suggestive meningoencephalitis but bacterial and viral CSF studies were negative-CSF tau was 1433 24 EGD showed no recurrent seizures and generalized slowing-hospital course was complicated with pyelonephritis, C. difficile colitis which were all treated.  Patient was reportedly altered at the nursing home and seemed to be "comatose". Per the son who is at bedside currently, it appears that patient was comatose at home as well prior to coming to the hospital last hospital admission 12/13. 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 Leonel Ramsay of neurology today on admission note was her very well and he states that he thinks that she is having minor status epilepticus which is not apparent grossly. The EEG performed on last admission showed this. He is recommending that we keep her in hospital and consider plasmapheresis as well as suppressive steroids to reduce the seizure foci.  Patient is completely incoherent although she is alert. She rambles and has significant tangentiality when discussing things. She's not hearing any voices denies any suicidal intent on direct line of questioning, but it is very difficult to get her to stay on topic.     Review of Systems:  Cannot assess as patient cannot give a proper history   Past Medical History  Diagnosis Date  . Peripheral neuropathy   . Fibromyalgia   . Hypertension   . Anxiety   . Catatonia    Past Surgical History  Procedure Laterality Date  . Gastric bypass    . Abdominal hysterectomy     Social History:  History   Social History Narrative  . No narrative on file    Allergies  Allergen Reactions  . Morphine And Related Other (See Comments)    REACTION: Causes pain  . Paroxetine Hcl Other (See Comments)    REACTION:  unknown  . Penicillins Other (See Comments)    REACTION: Makes her body hurt.  . Sertraline Hcl Other (See Comments)    REACTION:  unknown    Family History  Problem Relation Age of Onset  . Hypertension Mother   . Diabetes Father      Prior to Admission medications   Medication Sig Start Date End Date Taking? Authorizing Provider  acetaminophen (TYLENOL) 325 MG tablet Take 650 mg by mouth every 4 (four) hours as needed for mild pain or fever.    Historical Provider, MD  atenolol (TENORMIN) 25 MG tablet Take 12.5 mg by mouth daily.     Historical Provider, MD  chlorhexidine (PERIDEX) 0.12 % solution Use as directed 15 mLs in the mouth or throat 2 (two) times daily.    Historical Provider, MD  feeding supplement, RESOURCE BREEZE, (RESOURCE BREEZE) LIQD Take 1 Container by mouth 2 (two) times daily between meals.    Historical Provider, MD  folic acid (FOLVITE) 1 MG tablet Take 1  mg by mouth daily.    Historical Provider, MD  levETIRAcetam (KEPPRA) 1000 MG tablet Take 1,000 mg by mouth 2 (two) times daily.    Historical Provider, MD  loperamide (IMODIUM) 2 MG capsule Take 2 mg by mouth as needed for diarrhea or loose stools.    Historical Provider, MD  megestrol (MEGACE) 400 MG/10ML suspension Take 400 mg by mouth daily.    Historical Provider, MD  methocarbamol (ROBAXIN) 500 MG tablet Take 500 mg by mouth every 6 (six) hours as needed for muscle spasms.     Historical Provider, MD  mirtazapine (REMERON SOL-TAB) 30 MG disintegrating tablet Take 30 mg by mouth at bedtime.    Historical Provider, MD  Multiple Vitamins-Minerals (ELDERTONIC PO) Take 15 mLs by mouth 2 (two) times daily.    Historical Provider, MD  Nutritional Supplements (NUTRITIONAL SHAKE) LIQD Take 1 Can by mouth 3 (three) times daily before meals. Mighty shakes.    Historical Provider, MD  potassium chloride 20 MEQ/15ML (10%) solution Take 20 mEq by mouth daily. For 5 days. 06/11/13   Historical Provider, MD  sulfamethoxazole-trimethoprim (BACTRIM DS) 800-160 MG per tablet Take 1 tablet by mouth 2 (two) times daily. For 7 days. For UTI. 06/10/13   Historical Provider, MD  thiamine (VITAMIN B-1) 100 MG tablet Take 100 mg by mouth daily.    Historical Provider, MD  Valproic Acid (DEPAKENE) 250 MG/5ML SYRP syrup Take 250-500 mg by mouth 3 (three) times daily. Give 500 mg every morning, 250 mg at noon, and 500 mg at bedtime for seizure disorder.    Historical Provider, MD   Physical Exam: Filed Vitals:   06/19/13 1622 06/19/13 1627  BP:  129/94  Pulse:  108  Temp:  99.1 F (37.3 C)  TempSrc:  Oral  Resp:  18  Height: _0  (1.6 m)   Weight: 69 kg (152 lb 1.9 oz)   SpO2:  100%     General:  Alert, disoriented  Eyes: EOMI follows commands can tell me by direct confrontation on the fingers I have  ENT: Soft supple poor dentition no lymphadenopathy no swelling  Neck: No thyromegaly  Cardiovascular: S1-S2 slightly tachycardic  Respiratory: Clinically clear  Abdomen: Soft nontender  Skin: No lower extremity edema-very inconsistent exam-on examination echo to left leg and she screams in pain but then she doesn't do this subsequently  Musculoskeletal: Range of motion intact grossly  Psychiatric: Very strange affect  Neurologic: Power is limited in upper extremities, but objective findings out of portion to subjective exam and on command she is able to raise her arms and grip  my fingers with 5 out of 5 power but her effort is inconsistent. Finger-nose-finger test not attempted, gait not assessed sensory again is inconsistent  Labs on Admission:  Basic Metabolic Panel:  Recent Labs Lab 06/13/13 2034 06/18/13 1143  NA 135* 141  K 3.5* 4.0  CL 98 104  CO2 23 20  GLUCOSE 85 82  BUN 11 8  CREATININE 0.89 0.88  CALCIUM 8.1* 8.8   Liver Function Tests:  Recent Labs Lab 06/13/13 2034 06/18/13 1143  AST 26 30  ALT 9 11  ALKPHOS 42 43  BILITOT 0.4 0.4  PROT 6.2 6.7  ALBUMIN 2.7* 3.1*   No results found for this basename: LIPASE, AMYLASE,  in the last 168 hours No results found for this basename: AMMONIA,  in the last 168 hours CBC:  Recent Labs Lab 06/13/13 2034 06/18/13 1143  WBC 8.7 7.0  NEUTROABS 5.0  --   HGB 11.7* 12.9  HCT 35.3* 38.7  MCV 94.1 95.3  PLT 132* 129*   Cardiac Enzymes: No results found for this basename: CKTOTAL, CKMB, CKMBINDEX, TROPONINI,  in the last 168 hours  BNP (last 3 results) No results found for this basename: PROBNP,  in the last 8760 hours CBG: No results found for this basename: GLUCAP,  in the last 168 hours  Radiological Exams on Admission: Ir Fluoro Guide Cv Line Right  06/18/2013   EXAM: ULTRASOUND AND FLUOROSCOPIC GUIDED PICC LINE INSERTION  MEDICATIONS: None.  TECHNIQUE: The procedure, risks, benefits, and alternatives were explained to the patient's son and informed written consent was obtained. A timeout was performed prior to the initiation of the procedure.  The right upper extremity was prepped with chlorhexidine in a sterile fashion, and a sterile drape was applied covering the operative field. Maximum barrier sterile technique with sterile gowns and gloves were used for the procedure. A timeout was performed prior to the initiation of the procedure. Local anesthesia was provided with 1% lidocaine.  Under direct ultrasound guidance, the right brachial vein was accessed with a micropuncture kit after  the overlying soft tissues were anesthetized with 1% lidocaine. An ultrasound image was saved for documentation purposes. A guidewire was advanced to the level of the superior caval-atrial junction for measurement purposes and the PICC line was cut to length. A peel-away sheath was placed and a 36 cm, 5 [place], dual lumen was inserted to level of the superior caval-atrial junction. A post procedure spot fluoroscopic was obtained. The catheter easily aspirated and flushed and was sutured in place. A dressing was placed. The patient tolerated the procedure well without immediate post procedural complication.  CONTRAST:  None  FLUOROSCOPY TIME:  36 seconds.  COMPLICATIONS: None immediate  FINDINGS: After catheter placement, the tip lies within the superior cavoatrial junction. The catheter aspirates and flushes normally and is ready for immediate use.  IMPRESSION: Successful ultrasound and fluoroscopic guided placement of a right brachial vein approach, 36 cm, 5 French, dual lumen PICC with tip at the superior caval-atrial junction. The PICC line is ready for immediate use.  INDICATION: Poor venous access, in need of intravenous access for blood draws and medication administration   Electronically Signed   By: Sandi Mariscal M.D.   On: 06/18/2013 15:51   Ir US Guide Vasc Access Right  06/18/2013   EXAM: ULTRASOUND AND FLUOROSCOPIC GUIDED PICC LINE INSERTION  MEDICATIONS: None.  TECHNIQUE: The procedure, risks, benefits, and alternatives were explained to the patient's son and informed written consent was obtained. A timeout was performed prior to the initiation of the procedure.  The right upper extremity was prepped with chlorhexidine in a sterile fashion, and a sterile drape was applied covering the operative field. Maximum barrier sterile technique with sterile gowns and gloves were used for the procedure. A timeout was performed prior to the initiation of the procedure. Local anesthesia was provided with 1%  lidocaine.  Under direct ultrasound guidance, the right brachial vein was accessed with a micropuncture kit after the overlying soft tissues were anesthetized with 1% lidocaine. An ultrasound image was saved for documentation purposes. A guidewire was advanced to the level of the superior caval-atrial junction for measurement purposes and the PICC line was cut to length. A peel-away sheath was placed and a 36 cm, 5 [place], dual lumen was inserted to level of the superior caval-atrial junction. A post procedure spot fluoroscopic was obtained. The  catheter easily aspirated and flushed and was sutured in place. A dressing was placed. The patient tolerated the procedure well without immediate post procedural complication.  CONTRAST:  None  FLUOROSCOPY TIME:  36 seconds.  COMPLICATIONS: None immediate  FINDINGS: After catheter placement, the tip lies within the superior cavoatrial junction. The catheter aspirates and flushes normally and is ready for immediate use.  IMPRESSION: Successful ultrasound and fluoroscopic guided placement of a right brachial vein approach, 36 cm, 5 French, dual lumen PICC with tip at the superior caval-atrial junction. The PICC line is ready for immediate use.  INDICATION: Poor venous access, in need of intravenous access for blood draws and medication administration   Electronically Signed   By: Sandi Mariscal M.D.   On: 06/18/2013 15:51    EKG: Independently reviewed. None performed  Assessment/Plan Principal Problem:   Acute encephalopathy-unclear etiology her patient seems to be at a baseline of generalized confusion but she is now altered. I have discussed with Dr. Leonel Ramsay who will place recommendations from a neurological standpoint.  Differential diagnosis = C. difficile colitis, other pathogens, infectious etiology but unlikely to be bacterial meningitis has recent CSF tap was negative-will repeat C. 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 Note that patient's CSF was consistent with encephalitis from last visit but all of the other studies including HSV enterovirus were negative  I will defer treatment planning to neurology at this time but would strongly consider psychiatric input. For now I will discontinue the patient's Megace, Robaxin, nutritional shakes [can also cause diarrhea], and it would be reasonable to consider cutting back her mirtazapine. I will check a Keppra level as well as a Depakote level now to make sure she is not super therapeutic Lastly I will get an ammonia level to be sure she's not Depakote toxic. Further management as per neurology   Hypertension-continue atenolol    Catatonia-see above   Seizure disorder-see above   Code Status: Full  Family Communication: None at bedside Disposition Plan: Inpatient pending neurology input   Time spent: [place], [facility] Pager [phone]  If 7PM-7AM, please contact night-coverage [email or web address] Password [email or web address] 06/19/2013, 4:50 PM   Addend [address] and [place],[name] Son [phone] NO answerr LM to call back to get ancillary information Note [place] level is low.  Will defer to Neurology

## 2013-06-19 NOTE — Consult Note (Addendum)
Neurology Consultation Reason for Consult: Altered mental status Referring Physician: Pleas Koch  CC: Altered mental status  History is obtained from: Nursing facility, son, patient  HPI: Joan [name] is a 53 y.o. female who was admitted here recently with altered mental status. She had a prolonged episode of unresponsiveness lasting for approximately 4-5 days. On consultation on 12/15, it is noted that she had some arrhythmic jerks as well as eyelid blinking concerning for nonconvulsive status epilepticus. She was given Ativan with resolution of the symptoms. Her mental status subsequently improve slowly over the next couple of weeks, but she remains very confused.   At that time, workup consisted of lumbar puncture showing lymphocytic pleocytosis of 26, elevated protein. Enterovirus and herpes were negative. A 24-hour EEG showed an intermittent delta pattern felt to represent FIRDA. MRI showed bilateral thalamic T2 lesions it showed improvement between the first and second scan. She had clinical episodes concerning for seizure and was started on two antiepileptics.   Since that time, she has had persistent confusion and continues to be confined mostly to a wheelchair at her facility.  Per nursing at the facility, she is persistently confused, experiencing visual hallucinations. She is unable to participate in the majority of her daily activities.  She is unable to give me a reliable history, and does not know that she arrived here today, but thinks it was yesterday.    ROS: Unable to assess secondary to patient's altered mental status.    Past Medical History  Diagnosis Date  . Peripheral neuropathy   . Fibromyalgia   . Hypertension   . Anxiety   . Catatonia     Family History: Unable to assess secondary to patient's altered mental status.    Social History: Tob: Unable to assess secondary to patient's altered mental status.    Exam: Current vital signs: BP 129/94  Pulse  108  Temp(Src) 99.1 F (37.3 C) (Oral)  Resp 18  Ht 5\' 3"  (1.6 m)  Wt 69 kg (152 lb 1.9 oz)  BMI 26.95 kg/m2  SpO2 100% Vital signs in last 24 hours: Temp:  [99.1 F (37.3 C)] 99.1 F (37.3 C) (01/21 1627) Pulse Rate:  [108] 108 (01/21 1627) Resp:  [18] 18 (01/21 1627) BP: (129)/(94) 129/94 mmHg (01/21 1627) SpO2:  [100 %] 100 % (01/21 1627) Weight:  [69 kg (152 lb 1.9 oz)] 69 kg (152 lb 1.9 oz) (01/21 1622)  General: In bed, NAD CV: Regular rate and rhythm Mental Status: Patient is awake, alert, oriented to person, month, year, hospital but not [place] Appears to have impaired recent memory No signs of aphasia or neglect Cranial Nerves: II: Visual Fields are full. Pupils are equal, round, and reactive to light.  Discs are difficult to visualize. [name],IV, VI: EOMI without ptosis or diploplia.  V: Facial sensation is symmetric to temperature VII: Facial movement is symmetric.  VIII: hearing is intact to voice X: Uvula elevates symmetrically XI: Shoulder shrug is symmetric. XII: tongue is midline without atrophy or fasciculations.  Motor: Tone is normal. Bulk is normal. She does not give good effort in the right upper extremity, possible right upper extremity weakness. She is symmetric, though less than full effort in bilateral lower extremities. 5/5 in left upper tremor extremity.  Sensory: Sensation is symmetric to light touch in the arms and leg Deep Tendon Reflexes: 2+ and symmetric in the biceps and patellae.  Cerebellar: FNF  intact bilaterally Gait: Not tested due to patient safety concerns  I have reviewed labs in epic and the results pertinent to this consultation are: Mildly low platelets  I have reviewed the images obtained: Previous MRI is reviewed, thalamic lesions.  Impression: 53 year old female with altered mental status that is persistent. I continue to think she was in status epilepticus for multiple days at presentation of the previous  hospitalization. Given her continued hallucinations, and other concerning factors, autoimmune encephalitis is certainly a consideration. Her CSF panel was negative for this, however she will need serum panel sent as well. This will take some time to come back, and I would favor treating empirically in the interim with steroids and plasmapheresis.  Recommendations: 1) serum NMDA, [facility] paraneoplastic panel(includes voltage-gated potassium channel antibodies) 2) steroids, Solu-Medrol 500 mg twice a day x3 days 3) plasma exchange, treatment every other day 4) repeat MRI of the brain with and without contrast   Ritta SlotMcNeill Quartez Lagos, MD [facility] [phone]  If 7pm- 7am, please page neurology on call at [phone].

## 2013-06-19 NOTE — Progress Notes (Signed)
NURSING PROGRESS NOTE  Joan [name] [id number] Admission Data: 06/19/2013 7:57 PM Attending Provider: Rhetta MuraJai-Gurmukh Samtani, MD WUJ:[id number]:GREEN, Lenon CurtARTHUR G, MD Code Status: Full  Joan [name] is a 53 y.o. female patient admitted from ED:  -No acute distress noted.  -No complaints of shortness of breath.  -No complaints of chest pain.   Cardiac Monitoring: Box # 19 in place. Cardiac monitor yields:sinus tachycardia.  Blood pressure 129/94, pulse 108, temperature 99.1 F (37.3 C), temperature source Oral, resp. rate 18, height 5\' 3"  (1.6 m), weight 69 kg (152 lb 1.9 oz), SpO2 100.00%.   IV Fluids:  PICC in place, occlusive dsg intact without redness, PICC upper arm right, condition patent and no redness normal saline.   Allergies:  Morphine and related; Paroxetine hcl; Penicillins; and Sertraline hcl  Past Medical History:   has a past medical history of Peripheral neuropathy; Fibromyalgia; Hypertension; Anxiety; and Catatonia.  Past Surgical History:   has past surgical history that includes Gastric bypass and Abdominal hysterectomy.  Social History:   reports that she has never smoked. She does not have any smokeless tobacco history on file. She reports that she does not drink alcohol.  Skin: Intact  Patient/Family orientated to room. Information packet given to patient/family. Admission inpatient armband information verified with patient/family to include name and date of birth and placed on patient arm. Side rails up x 2, fall assessment and education completed with patient/family. Patient/family able to verbalize understanding of risk associated with falls and verbalized understanding to call for assistance before getting out of bed. Call light within reach. Patient/family able to voice and demonstrate understanding of unit orientation instructions.    Will continue to evaluate and treat per MD orders.

## 2013-06-19 NOTE — Telephone Encounter (Signed)
ok 

## 2013-06-19 NOTE — Telephone Encounter (Signed)
Gave to [place] to place order

## 2013-06-20 ENCOUNTER — Inpatient Hospital Stay (HOSPITAL_COMMUNITY): Payer: Medicare Other

## 2013-06-20 ENCOUNTER — Other Ambulatory Visit (HOSPITAL_COMMUNITY): Payer: BC Managed Care – PPO

## 2013-06-20 ENCOUNTER — Encounter (HOSPITAL_COMMUNITY): Payer: Self-pay | Admitting: Emergency Medicine

## 2013-06-20 ENCOUNTER — Telehealth: Payer: Self-pay | Admitting: *Deleted

## 2013-06-20 DIAGNOSIS — N179 Acute kidney failure, unspecified: Secondary | ICD-10-CM

## 2013-06-20 DIAGNOSIS — I10 Essential (primary) hypertension: Secondary | ICD-10-CM

## 2013-06-20 LAB — PROTIME-INR
INR: 1.32 (ref 0.00–1.49)
Prothrombin Time: 16.1 seconds — ABNORMAL HIGH (ref 11.6–15.2)

## 2013-06-20 LAB — CBC
HEMATOCRIT: 36.3 % (ref 36.0–46.0)
Hemoglobin: 12 g/dL (ref 12.0–15.0)
MCH: 31.3 pg (ref 26.0–34.0)
MCHC: 33.1 g/dL (ref 30.0–36.0)
MCV: 94.8 fL (ref 78.0–100.0)
PLATELETS: 129 10*3/uL — AB (ref 150–400)
RBC: 3.83 MIL/uL — ABNORMAL LOW (ref 3.87–5.11)
RDW: 17.4 % — AB (ref 11.5–15.5)
WBC: 2.3 10*3/uL — AB (ref 4.0–10.5)

## 2013-06-20 LAB — GLUCOSE, CAPILLARY: Glucose-Capillary: 111 mg/dL — ABNORMAL HIGH (ref 70–99)

## 2013-06-20 LAB — COMPREHENSIVE METABOLIC PANEL
ALT: 9 U/L (ref 0–35)
AST: 25 U/L (ref 0–37)
Albumin: 2.7 g/dL — ABNORMAL LOW (ref 3.5–5.2)
Alkaline Phosphatase: 38 U/L — ABNORMAL LOW (ref 39–117)
BILIRUBIN TOTAL: 0.4 mg/dL (ref 0.3–1.2)
BUN: 6 mg/dL (ref 6–23)
CO2: 20 mEq/L (ref 19–32)
Calcium: 8.6 mg/dL (ref 8.4–10.5)
Chloride: 106 mEq/L (ref 96–112)
Creatinine, Ser: 0.69 mg/dL (ref 0.50–1.10)
GFR calc non Af Amer: >90 mL/min (ref >90)
GLUCOSE: 112 mg/dL — AB (ref 70–99)
POTASSIUM: 4.2 meq/L (ref 3.7–5.3)
Sodium: 141 mEq/L (ref 137–147)
TOTAL PROTEIN: 6.3 g/dL (ref 6.0–8.3)

## 2013-06-20 LAB — GI PATHOGEN PANEL BY PCR, STOOL
C DIFFICILE TOXIN A/B: NEGATIVE
CAMPYLOBACTER BY PCR: NEGATIVE
CRYPTOSPORIDIUM BY PCR: NEGATIVE
E coli (ETEC) LT/ST: NEGATIVE
E coli (STEC): NEGATIVE
E coli 0157 by PCR: NEGATIVE
G LAMBLIA BY PCR: NEGATIVE
NOROVIRUS G1/G2: NEGATIVE
ROTAVIRUS A BY PCR: POSITIVE
Salmonella by PCR: NEGATIVE
Shigella by PCR: NEGATIVE

## 2013-06-20 LAB — FECAL LACTOFERRIN, QUANT: Fecal Lactoferrin: NEGATIVE

## 2013-06-20 LAB — CLOSTRIDIUM DIFFICILE BY PCR: Toxigenic C. Difficile by PCR: NEGATIVE

## 2013-06-20 LAB — LEVETIRACETAM LEVEL: Levetiracetam Lvl: 26 ug/mL (ref 5.0–30.0)

## 2013-06-20 MED ORDER — SODIUM CHLORIDE 0.9 % IV SOLN
INTRAVENOUS | Status: DC
  Administered 2013-06-20 (×2): via INTRAVENOUS_CENTRAL

## 2013-06-20 MED ORDER — SODIUM CHLORIDE 0.9 % IV SOLN
INTRAVENOUS | Status: AC
  Filled 2013-06-20 (×3): qty 200

## 2013-06-20 MED ORDER — CALCIUM CARBONATE ANTACID 500 MG PO CHEW
2.0000 | CHEWABLE_TABLET | ORAL | Status: AC
  Filled 2013-06-20 (×2): qty 2

## 2013-06-20 MED ORDER — GADOBENATE DIMEGLUMINE 529 MG/ML IV SOLN
15.0000 mL | Freq: Once | INTRAVENOUS | Status: AC | PRN
Start: 2013-06-20 — End: 2013-06-20
  Administered 2013-06-20: 15 mL via INTRAVENOUS

## 2013-06-20 MED ORDER — ACD FORMULA A 0.73-2.45-2.2 GM/100ML VI SOLN
500.0000 mL | Status: DC
  Administered 2013-06-20: 500 mL via INTRAVENOUS

## 2013-06-20 MED ORDER — CALCIUM CARBONATE ANTACID 500 MG PO CHEW
CHEWABLE_TABLET | ORAL | Status: AC
  Filled 2013-06-20: qty 2

## 2013-06-20 MED ORDER — FENTANYL CITRATE 0.05 MG/ML IJ SOLN
INTRAMUSCULAR | Status: AC
  Filled 2013-06-20: qty 2

## 2013-06-20 MED ORDER — VALPROIC ACID 250 MG PO CAPS
500.0000 mg | ORAL_CAPSULE | Freq: Three times a day (TID) | ORAL | Status: DC
  Administered 2013-06-20 – 2013-06-24 (×13): 500 mg via ORAL
  Filled 2013-06-20 (×14): qty 2

## 2013-06-20 MED ORDER — HEPARIN SODIUM (PORCINE) 1000 UNIT/ML IJ SOLN
1000.0000 [IU] | Freq: Once | INTRAMUSCULAR | Status: DC
  Filled 2013-06-20: qty 1

## 2013-06-20 MED ORDER — DIPHENHYDRAMINE HCL 25 MG PO CAPS: 25.0000 mg | ORAL_CAPSULE | Freq: Four times a day (QID) | ORAL | Status: DC | PRN

## 2013-06-20 MED ORDER — SODIUM CHLORIDE 0.9 % IV SOLN
4.0000 g | Freq: Once | INTRAVENOUS | Status: AC
  Administered 2013-06-20: 4 g via INTRAVENOUS
  Filled 2013-06-20: qty 40

## 2013-06-20 MED ORDER — ANTICOAGULANT SODIUM CITRATE 4% (200MG/5ML) IV SOLN: 5.0000 mL | Freq: Once | Status: DC

## 2013-06-20 MED ORDER — MIDAZOLAM HCL 2 MG/2ML IJ SOLN
INTRAMUSCULAR | Status: AC | PRN
  Administered 2013-06-20 (×2): 1 mg via INTRAVENOUS

## 2013-06-20 MED ORDER — ACD FORMULA A 0.73-2.45-2.2 GM/100ML VI SOLN
500.0000 mL | Status: DC
  Filled 2013-06-20: qty 500

## 2013-06-20 MED ORDER — MIDAZOLAM HCL 2 MG/2ML IJ SOLN
INTRAMUSCULAR | Status: AC
  Filled 2013-06-20: qty 2

## 2013-06-20 MED ORDER — FENTANYL CITRATE 0.05 MG/ML IJ SOLN
INTRAMUSCULAR | Status: AC | PRN
  Administered 2013-06-20 (×2): 50 ug via INTRAVENOUS

## 2013-06-20 MED ORDER — CALCIUM CARBONATE ANTACID 500 MG PO CHEW
2.0000 | CHEWABLE_TABLET | ORAL | Status: DC
  Administered 2013-06-20: 400 mg via ORAL

## 2013-06-20 MED ORDER — ACETAMINOPHEN 325 MG PO TABS
650.0000 mg | ORAL_TABLET | ORAL | Status: DC | PRN
  Filled 2013-06-20: qty 2

## 2013-06-20 MED ORDER — HEPARIN SODIUM (PORCINE) 1000 UNIT/ML IJ SOLN
INTRAMUSCULAR | Status: AC
  Filled 2013-06-20: qty 1

## 2013-06-20 MED ORDER — ACD FORMULA A 0.73-2.45-2.2 GM/100ML VI SOLN
Status: AC
  Filled 2013-06-20: qty 500

## 2013-06-20 MED ORDER — VANCOMYCIN HCL IN DEXTROSE 1-5 GM/200ML-% IV SOLN
1000.0000 mg | Freq: Once | INTRAVENOUS | Status: AC
  Administered 2013-06-20: 1000 mg via INTRAVENOUS
  Filled 2013-06-20: qty 200

## 2013-06-20 NOTE — Telephone Encounter (Signed)
done

## 2013-06-20 NOTE — ED Notes (Signed)
Pt waiting to go to MRI

## 2013-06-20 NOTE — Clinical Social Work Psychosocial (Signed)
Clinical Social Work Department BRIEF PSYCHOSOCIAL ASSESSMENT 06/20/2013  Patient:  Joan [name],Joan [name]     Account Number:  [id number]     Admit date:  06/19/2013  Clinical Social Worker:  Lavell LusterAMPBELL,Areona Homer BRYANT, LCSWA  Date/Time:  06/20/2013 02:18 PM  Referred by:  Physician  Date Referred:  06/20/2013 Referred for  SNF Placement   Other Referral:   Interview type:  Family Other interview type:   Patient not oriented at time of assessment. CSW interviewed patient's son Joan [name].    PSYCHOSOCIAL DATA Living Status:  FAMILY Admitted from facility:  [name] Level of care:  Skilled Nursing Facility Primary support name:  Joan [name] Primary support relationship to patient:  CHILD, ADULT Degree of support available:   Support is adequate.    CURRENT CONCERNS Current Concerns  Post-Acute Placement   Other Concerns:    SOCIAL WORK ASSESSMENT / PLAN CSW spoke with patient's son [name] over phone as patient was [facility] on [facility]. CSW recongnizes patient from previous admission. Patient's son states that patient was not doing well [facility]. He states that he is frustrated because he feels like no one seems to know what is going on with his mother. He states that [facility] too much from her in Physical Therapy and she was they told him that if she didn't participtate in therapy that she would need to return home. Per son, facility also seemed confused about what was wrong with the patient which is why they brought her to the emergency room. Son states that he wants patient to go to [facility]. He is ok with patient going back to [facility], but feels that [facility] will do a better job of meeting his mother's needs. Per SW with facility, patient has not eaten since she was admitted to the facility. SW states that the facility tried to get a PEG tube placed for the patient, but she has a Hx of gastric bypass. SW was wondering if it would be appropriate for  patient to get a "J-tube" during this hospitalization. CSW has passed this information on to PA. CSW will assist with DC needs.   Assessment/plan status:  Psychosocial Support/Ongoing Assessment of Needs Other assessment/ plan:   Complete Fl2, Fax   Information/referral to community resources:   CSW contact information given to son.    PATIENT'S/FAMILY'S RESPONSE TO PLAN OF CARE: Patient's son is agreeable to patient returning [facility] if necessary, but really would like to see patient admitted to [facility]. Patient's son was pleasant, appropriate, and appreciative of CSW contact. CSW will assist with DC if patient needs to return to SNF.       Roddie McBryant Tiera Mensinger, Midland[place]CSWA, [place], [phone]

## 2013-06-20 NOTE — Consult Note (Signed)
Agree 

## 2013-06-20 NOTE — Consult Note (Signed)
HPI: Joan [name] is an 53 y.o. female with altered mental status. Workup is ongoing and IR is requested to place plasmapheresis catheter for plasma exchange. Chart, PMHx, and meds reviewed. Pt received sub-q heparin this am. Has been NPO  Past Medical History:  Past Medical History  Diagnosis Date  . Peripheral neuropathy   . Fibromyalgia   . Hypertension   . Anxiety   . Catatonia   . Seizures     Past Surgical History:  Past Surgical History  Procedure Laterality Date  . Gastric bypass    . Abdominal hysterectomy      Family History:  Family History  Problem Relation Age of Onset  . Hypertension Mother   . Diabetes Father     Social History:  reports that she has never smoked. She does not have any smokeless tobacco history on file. She reports that she does not drink alcohol. Her drug history is not on file.  Allergies:  Allergies  Allergen Reactions  . Morphine And Related Other (See Comments)    REACTION: Causes pain  . Paroxetine Hcl Other (See Comments)    REACTION:  unknown  . Penicillins Other (See Comments)    REACTION: Makes her body hurt.  . Sertraline Hcl Other (See Comments)    REACTION:  unknown    Medications: Current facility-administered medications:acetaminophen (TYLENOL) tablet 650 mg, 650 mg, Oral, Q4H PRN, Roland Rack, MD;  acetaminophen (TYLENOL) tablet 650 mg, 650 mg, Oral, Q6H PRN, Gardiner Barefoot, NP, 650 mg at 06/20/13 0357;  albumin human 50 g in sodium chloride 0.9 %, , Dialysis, Q1 Hr x 3, Roland Rack, MD;  atenolol (TENORMIN) tablet 12.5 mg, 12.5 mg, Oral, Daily, Nita Sells, MD, 12.5 mg at 06/19/13 1934 calcium carbonate (TUMS - dosed in mg elemental calcium) chewable tablet 400 mg of elemental calcium, 2 tablet, Oral, Q3H, Roland Rack, MD;  calcium carbonate (TUMS - dosed in mg elemental calcium) chewable tablet 400 mg of elemental calcium, 2 tablet, Oral, Q3H, Roland Rack, MD;  calcium  gluconate 4 g in sodium chloride 0.9 % 250 mL IVPB, 4 g, Intravenous, Once, Roland Rack, MD citrate dextrose (ACD-A anticoagulant) 0.73-2.45-2.2 GM/100ML solution, , , , ;  citrate dextrose (ACD-A anticoagulant) solution 500 mL, 500 mL, Intravenous, Continuous, Roland Rack, MD;  diphenhydrAMINE (BENADRYL) capsule 25 mg, 25 mg, Oral, Q6H PRN, Roland Rack, MD;  heparin injection 1,000 Units, 1,000 Units, Intracatheter, Once, Roland Rack, MD heparin injection 1,000 Units, 1,000 Units, Intracatheter, Once, Roland Rack, MD;  heparin injection 5,000 Units, 5,000 Units, Subcutaneous, Q8H, Nita Sells, MD, 5,000 Units at 06/20/13 0759;  levETIRAcetam (KEPPRA) tablet 1,000 mg, 1,000 mg, Oral, BID, Nita Sells, MD, 1,000 mg at 06/19/13 2106 methylPREDNISolone sodium succinate (SOLU-MEDROL) 500 mg in sodium chloride 0.9 % 50 mL IVPB, 500 mg, Intravenous, Q12H, Roland Rack, MD, 500 mg at 06/20/13 0800;  mirtazapine (REMERON SOL-TAB) disintegrating tablet 30 mg, 30 mg, Oral, QHS, Nita Sells, MD, 30 mg at 06/19/13 2158;  sodium chloride 0.9 % injection 10-40 mL, 10-40 mL, Intracatheter, PRN, Nita Sells, MD, 10 mL at 06/19/13 2121 sodium chloride 0.9 % injection 3 mL, 3 mL, Intravenous, Q12H, Nita Sells, MD;  thiamine (VITAMIN B-1) tablet 100 mg, 100 mg, Oral, Daily, Nita Sells, MD, 100 mg at 06/19/13 1933;  valproic acid (DEPAKENE) 250 MG capsule 250 mg, 250 mg, Oral, QAC supper, Roland Rack, MD;  valproic acid (DEPAKENE) 250 MG capsule 500 mg, 500 mg, Oral, BID, Roland Rack, MD, 500 mg  at 06/19/13 2157  Please HPI for pertinent positives, otherwise complete 10 system ROS negative.  Physical Exam: BP 145/89  Pulse 113  Temp(Src) 99.1 F (37.3 C) (Oral)  Resp 18  Ht 5' 3"  (1.6 m)  Wt 152 lb 1.9 oz (69 kg)  BMI 26.95 kg/m2  SpO2 100% Body mass index is 26.95 kg/(m^2).   General Appearance:  Awake,  cooperative, no distress, appears stated age  Head:  Normocephalic, without obvious abnormality, atraumatic  ENT: Unremarkable  Neck: Supple, symmetrical, trachea midline  Lungs:   Clear to auscultation bilaterally, no w/r/r, respirations unlabored without use of accessory muscles.  Chest Wall:  No tenderness or deformity  Heart:  Regular rate and rhythm, S1, S2 normal, no murmur, rub or gallop.  Abdomen:   Soft, non-tender, non distended.  Extremities: Extremities no cyanosis or edema, (R)UE PICC intact  Pulses: 2+ and symmetric   Results for orders placed during the hospital encounter of 06/19/13 (from the past 48 hour(s))  MRSA PCR SCREENING     Status: None   Collection Time    06/19/13  5:14 PM      Result Value Range   MRSA by PCR NEGATIVE  NEGATIVE   Comment:            The GeneXpert MRSA Assay (FDA     approved for NASAL specimens     only), is one component of a     comprehensive MRSA colonization     surveillance program. It is not     intended to diagnose MRSA     infection nor to guide or     monitor treatment for     MRSA infections.  FECAL LACTOFERRIN     Status: None   Collection Time    06/19/13  5:14 PM      Result Value Range   Specimen Description STOOL     Special Requests NONE     Fecal Lactoferrin       Value: NEGATIVE     Performed at [facility]   Report Status 06/20/2013 FINAL    COMPREHENSIVE METABOLIC PANEL     Status: Abnormal   Collection Time    06/19/13  7:25 PM      Result Value Range   Sodium 142  137 - 147 mEq/L   Potassium 3.6 (*) 3.7 - 5.3 mEq/L   Chloride 109  96 - 112 mEq/L   CO2 21  19 - 32 mEq/L   Glucose, Bld 94  70 - 99 mg/dL   BUN 6  6 - 23 mg/dL   Creatinine, Ser 0.64  0.50 - 1.10 mg/dL   Calcium 8.3 (*) 8.4 - 10.5 mg/dL   Total Protein 5.9 (*) 6.0 - 8.3 g/dL   Albumin 2.6 (*) 3.5 - 5.2 g/dL   AST 23  0 - 37 U/L   ALT 9  0 - 35 U/L   Alkaline Phosphatase 35 (*) 39 - 117 U/L   Total Bilirubin 0.4  0.3 - 1.2  mg/dL   GFR calc non Af Amer >90  >90 mL/min   GFR calc Af Amer >90  >90 mL/min   Comment: (NOTE)     The eGFR has been calculated using the CKD EPI equation.     This calculation has not been validated in all clinical situations.     eGFR's persistently <90 mL/min signify possible Chronic Kidney     Disease.  CBC WITH DIFFERENTIAL  Status: Abnormal   Collection Time    06/19/13  7:25 PM      Result Value Range   WBC 5.0  4.0 - 10.5 K/uL   RBC 3.59 (*) 3.87 - 5.11 MIL/uL   Hemoglobin 11.2 (*) 12.0 - 15.0 g/dL   HCT 34.8 (*) 36.0 - 46.0 %   MCV 96.9  78.0 - 100.0 fL   MCH 31.2  26.0 - 34.0 pg   MCHC 32.2  30.0 - 36.0 g/dL   RDW 17.6 (*) 11.5 - 15.5 %   Platelets 113 (*) 150 - 400 K/uL   Comment: CONSISTENT WITH PREVIOUS RESULT   Neutrophils Relative % 57  43 - 77 %   Lymphocytes Relative 33  12 - 46 %   Monocytes Relative 10  3 - 12 %   Eosinophils Relative 0  0 - 5 %   Basophils Relative 0  0 - 1 %   Band Neutrophils 0  0 - 10 %   Metamyelocytes Relative 0     Myelocytes 0     Promyelocytes Absolute 0     Blasts 0     nRBC 0  0 /100 WBC   Neutro Abs 2.8  1.7 - 7.7 K/uL   Lymphs Abs 1.7  0.7 - 4.0 K/uL   Monocytes Absolute 0.5  0.1 - 1.0 K/uL   Eosinophils Absolute 0.0  0.0 - 0.7 K/uL   Basophils Absolute 0.0  0.0 - 0.1 K/uL   WBC Morphology ATYPICAL LYMPHOCYTES    VALPROIC ACID LEVEL     Status: Abnormal   Collection Time    06/19/13  7:25 PM      Result Value Range   Valproic Acid Lvl 35.1 (*) 50.0 - 100.0 ug/mL  LEVETIRACETAM LEVEL     Status: None   Collection Time    06/19/13  7:25 PM      Result Value Range   Levetiracetam Lvl 26.0  5.0 - 30.0 ug/mL   Comment: Performed at [place]     Status: None   Collection Time    06/19/13  7:25 PM      Result Value Range   Ammonia 24  11 - 60 umol/L  COMPREHENSIVE METABOLIC PANEL     Status: Abnormal   Collection Time    06/20/13  5:15 AM      Result Value Range   Sodium 141  137 - 147  mEq/L   Potassium 4.2  3.7 - 5.3 mEq/L   Chloride 106  96 - 112 mEq/L   CO2 20  19 - 32 mEq/L   Glucose, Bld 112 (*) 70 - 99 mg/dL   BUN 6  6 - 23 mg/dL   Creatinine, Ser 0.69  0.50 - 1.10 mg/dL   Calcium 8.6  8.4 - 10.5 mg/dL   Total Protein 6.3  6.0 - 8.3 g/dL   Albumin 2.7 (*) 3.5 - 5.2 g/dL   AST 25  0 - 37 U/L   ALT 9  0 - 35 U/L   Alkaline Phosphatase 38 (*) 39 - 117 U/L   Total Bilirubin 0.4  0.3 - 1.2 mg/dL   GFR calc non Af Amer >90  >90 mL/min   GFR calc Af Amer >90  >90 mL/min   Comment: (NOTE)     The eGFR has been calculated using the CKD EPI equation.     This calculation has not been validated in all clinical situations.  eGFR's persistently <90 mL/min signify possible Chronic Kidney     Disease.  CBC     Status: Abnormal   Collection Time    06/20/13  5:15 AM      Result Value Range   WBC 2.3 (*) 4.0 - 10.5 K/uL   RBC 3.83 (*) 3.87 - 5.11 MIL/uL   Hemoglobin 12.0  12.0 - 15.0 g/dL   HCT 36.3  36.0 - 46.0 %   MCV 94.8  78.0 - 100.0 fL   MCH 31.3  26.0 - 34.0 pg   MCHC 33.1  30.0 - 36.0 g/dL   RDW 17.4 (*) 11.5 - 15.5 %   Platelets 129 (*) 150 - 400 K/uL  PROTIME-INR     Status: Abnormal   Collection Time    06/20/13  5:15 AM      Result Value Range   Prothrombin Time 16.1 (*) 11.6 - 15.2 seconds   INR 1.32  0.00 - 1.49  GLUCOSE, CAPILLARY     Status: Abnormal   Collection Time    06/20/13  8:34 AM      Result Value Range   Glucose-Capillary 111 (*) 70 - 99 mg/dL   Comment 1 Documented in Chart     Comment 2 Notify RN     Ir Fluoro Guide Cv Line Right  06/18/2013   EXAM: ULTRASOUND AND FLUOROSCOPIC GUIDED PICC LINE INSERTION  MEDICATIONS: None.  TECHNIQUE: The procedure, risks, benefits, and alternatives were explained to the patient's son and informed written consent was obtained. A timeout was performed prior to the initiation of the procedure.  The right upper extremity was prepped with chlorhexidine in a sterile fashion, and a sterile drape was  applied covering the operative field. Maximum barrier sterile technique with sterile gowns and gloves were used for the procedure. A timeout was performed prior to the initiation of the procedure. Local anesthesia was provided with 1% lidocaine.  Under direct ultrasound guidance, the right brachial vein was accessed with a micropuncture kit after the overlying soft tissues were anesthetized with 1% lidocaine. An ultrasound image was saved for documentation purposes. A guidewire was advanced to the level of the superior caval-atrial junction for measurement purposes and the PICC line was cut to length. A peel-away sheath was placed and a 36 cm, 5 [place], dual lumen was inserted to level of the superior caval-atrial junction. A post procedure spot fluoroscopic was obtained. The catheter easily aspirated and flushed and was sutured in place. A dressing was placed. The patient tolerated the procedure well without immediate post procedural complication.  CONTRAST:  None  FLUOROSCOPY TIME:  36 seconds.  COMPLICATIONS: None immediate  FINDINGS: After catheter placement, the tip lies within the superior cavoatrial junction. The catheter aspirates and flushes normally and is ready for immediate use.  IMPRESSION: Successful ultrasound and fluoroscopic guided placement of a right brachial vein approach, 36 cm, 5 French, dual lumen PICC with tip at the superior caval-atrial junction. The PICC line is ready for immediate use.  INDICATION: Poor venous access, in need of intravenous access for blood draws and medication administration   Electronically Signed   By: Sandi Mariscal M.D.   On: 06/18/2013 15:51   Ir US Guide Vasc Access Right  06/18/2013   EXAM: ULTRASOUND AND FLUOROSCOPIC GUIDED PICC LINE INSERTION  MEDICATIONS: None.  TECHNIQUE: The procedure, risks, benefits, and alternatives were explained to the patient's son and informed written consent was obtained. A timeout was performed prior to the initiation of  the  procedure.  The right upper extremity was prepped with chlorhexidine in a sterile fashion, and a sterile drape was applied covering the operative field. Maximum barrier sterile technique with sterile gowns and gloves were used for the procedure. A timeout was performed prior to the initiation of the procedure. Local anesthesia was provided with 1% lidocaine.  Under direct ultrasound guidance, the right brachial vein was accessed with a micropuncture kit after the overlying soft tissues were anesthetized with 1% lidocaine. An ultrasound image was saved for documentation purposes. A guidewire was advanced to the level of the superior caval-atrial junction for measurement purposes and the PICC line was cut to length. A peel-away sheath was placed and a 36 cm, 5 [place], dual lumen was inserted to level of the superior caval-atrial junction. A post procedure spot fluoroscopic was obtained. The catheter easily aspirated and flushed and was sutured in place. A dressing was placed. The patient tolerated the procedure well without immediate post procedural complication.  CONTRAST:  None  FLUOROSCOPY TIME:  36 seconds.  COMPLICATIONS: None immediate  FINDINGS: After catheter placement, the tip lies within the superior cavoatrial junction. The catheter aspirates and flushes normally and is ready for immediate use.  IMPRESSION: Successful ultrasound and fluoroscopic guided placement of a right brachial vein approach, 36 cm, 5 French, dual lumen PICC with tip at the superior caval-atrial junction. The PICC line is ready for immediate use.  INDICATION: Poor venous access, in need of intravenous access for blood draws and medication administration   Electronically Signed   By: Sandi Mariscal M.D.   On: 06/18/2013 15:51    Assessment/Plan Altered mental status, encephalopathy For plasmapheresis Discussed procedure of tunneled pheresis catheter placement with son, [name], via phone. Consent obtained. Labs reviewed.  Ascencion Dike PA-C 06/20/2013, 8:59 AM

## 2013-06-20 NOTE — Progress Notes (Signed)
Addendum  Patient seen and examined, chart and data base reviewed.  I agree with the above assessment and plan.  For full details please see Mrs. Algis DownsMarianne York PA note.  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  Patient started on high-dose steroids, patient will have plasmapheresis as well.   Clint LippsMutaz A Taegen Delker, MD [facility] Pager: [phone] 06/20/2013, 3:57 PM

## 2013-06-20 NOTE — Progress Notes (Signed)
Patient ID: Candida PeelingLora Duris, female   DOB: 06/23/1960, 53 y.o.   MRN: [id number]  Patient is not in her room and staff RN stated she was taken to MRI suit and will follow up later.  Jaleen Grupp,JANARDHAHA R. 06/20/2013 3:36 PM

## 2013-06-20 NOTE — Progress Notes (Signed)
Called son, [name], ([place]) but no one answered. Left message asking him to return a phone call to the unit to speak with his mom's nurse regarding a procedural consent.

## 2013-06-20 NOTE — Clinical Social Work Note (Signed)
CS[facility] clinical[facility]  Roddie McBryant Jasmond River, YoeLCSWA, [place]AS[place], [phone]

## 2013-06-20 NOTE — Progress Notes (Signed)
Subjective: No acute events overnight.   Exam: Filed Vitals:   06/20/13 1652  BP: 150/98  Pulse: 102  Temp: 98 F (36.7 C)  Resp: 18   Limited exam performed while accompanying patient to dialysis.   Gen: In bed, NAD MS: Awake, alert, knows she is at a hospital, but not which one. Unable to tell me how long she has been here.  WG:NFAOZCN:PERRL, EOMI  Impression: 53 year old female with altered mental status that is persistent. Likely was in NCSE for mulitple days prior at presentation in December presentation of the previous hospitalization. Given her continued hallucinations, and other concerning factors, autoimmune encephalitis is certainly a consideration. Her CSF panel was negative for this, however she will need serum panel sent as well. This will take some time to come back, and I would favor treating empirically in the interim with steroids and plasmapheresis.  With MRI and CSF abnormalities, I do not think that there is a psychiatric basis to her presentation.   Recommendations: 1) PLEX treatment 1/5 2) continue solumedrol  3) depakote increased to 500mg  TID, check level again in AM.  4) depakote level in AM 5) continue keppra 1gm BID  Ritta SlotMcNeill Trampas Stettner, MD [facility] [phone]  If 7pm- 7am, please page neurology on call at [phone].

## 2013-06-20 NOTE — Progress Notes (Signed)
[facility] PROGRESS NOTE  Joan [name] WVP:[id number] DOB: 17-Jan-1961 DOA: 06/19/2013 PCP: Estill Dooms, MD  HPI/Subjective: 53 yr old female presents with a CC of Altered mental status.  Pt has a PMH of peripheral neuropathy, seizures, catatonia, anxiety, HTN, and fibromyalgia.  Pt was previously admitted on 12/13 thru 12/28 with toxic metabolic encephalopathy.  CSF was suggestive of meningoencephalitis but bacterial and viral CSF studies were negative in December.  EEG showed no recurrent seizures.  A 24 hour EEG at previous admission showed an intermittent delta pattern felt to represent Frontal Intermittent Rhythmic Delta Activity.  Pt was started on two antiepileptics.  She was again found altered and seemed "comatose" before coming to the hospital on 06/19/13.  Since d/c in December, pt has had persistent confusion and is confined to a wheelchair at [facility].  She is confused and experiencing visual hallucinations.  Pt was awake during the encounter.  Pt is scared and worried about her care.  When asked about her location, she believes that she is in [facility].  Was unable to get more information on her progression as she continued to mumble and could not stay focused.  Was able to elicit verbal confirmation about areas of pain.      Assessment/Plan: Acute Encephalopathy -Uncertain if this is a psych issue or a neuro issue. -Her presentation seems similar to a psychotic break. -Have consulted Neuro and Psych -Per neuro:  She is scheduled to receive diatech catheter placement and plasmapheresis today 06/20/13 for potential autoimmune encephalitis -MRI of the brain w/o contrast scheduled for later today, potential transfer to [facility] to receive MRI -Pt currently taking Solu-Medrol 500 mg q 12 hours. -Ammonia level is WNL  Hx of Convulsions/Seizures -Pt has a PMH of seizures and catatonia.  Pt was comatose prior to admission -MRI of the brain ordered -Pt is currently taking  Keppra and Depakene.  -Depakote level was low on admit. Increase dose to 500 mg tid. -Appreciate neuro consultation.  Hx of Anxiety -Continue Remeron  Hypertension -Pt has a PMH of HTN and most recent BP was 145/89 -Likely secondary to anxiety -Continue atenolol and monitor for symptoms  Recent hx of c-diff (05/18/13) -Patient negative for C-diff this admit.   DVT Prophylaxis:  IV Heparin  Code Status: Full Code Family Communication: Son was called to explain procedures and status Disposition Plan: D/C to SNF when medically appropriate   Consultants:  Psych  Neuro  Procedures: Plasmapheresis PICC Line Placement  Antibiotics:  None  Objective: Filed Vitals:   06/20/13 1340 06/20/13 1345 06/20/13 1349 06/20/13 1400  BP: 101/83 140/77 115/82 136/77  Pulse: 104 104 100 102  Temp:      TempSrc:      Resp: 17 24 13 23   Height:      Weight:      SpO2: 100% 100% 100% 100%   No intake or output data in the 24 hours ending 06/20/13 1438 Filed Weights   06/19/13 1622 06/20/13 0626  Weight: 69 kg (152 lb 1.9 oz) 69 kg (152 lb 1.9 oz)    Exam: General: awake and alert, frightful, appears mild distress HEENT:  PERRLA, EOMI, Anicteic Sclera, Neck: Supple, no JVD, no masses, no lymphadenopathy Cardiovascular: Regular rate, tachycardic rhythm, S1 S2 auscultated, no rubs, murmurs or gallops.   Respiratory: Clear to auscultation bilaterally with equal chest rise  Abdomen: Soft, nontender, nondistended, + bowel sounds  Extremities: No swelling, or cyanosis.  Pain in the legs elicited  Neuro:  Diminished strength equally in all 4 ext. Skin: No scars or scratches observed Psych: Abnormal affect, pt appears frightened    Data Reviewed: Basic Metabolic Panel:  Recent Labs Lab 06/13/13 2034 06/18/13 1143 06/19/13 1925 06/20/13 0515  NA 135* 141 142 141  K 3.5* 4.0 3.6* 4.2  CL 98 104 109 106  CO2 23 20 21 20   GLUCOSE 85 82 94 112*  BUN 11 8 6 6   CREATININE  0.89 0.88 0.64 0.69  CALCIUM 8.1* 8.8 8.3* 8.6   Liver Function Tests:  Recent Labs Lab 06/13/13 2034 06/18/13 1143 06/19/13 1925 06/20/13 0515  AST 26 30 23 25   ALT 9 11 9 9   ALKPHOS 42 43 35* 38*  BILITOT 0.4 0.4 0.4 0.4  PROT 6.2 6.7 5.9* 6.3  ALBUMIN 2.7* 3.1* 2.6* 2.7*    Recent Labs Lab 06/19/13 1925  AMMONIA 24   CBC:  Recent Labs Lab 06/13/13 2034 06/18/13 1143 06/19/13 1925 06/20/13 0515  WBC 8.7 7.0 5.0 2.3*  NEUTROABS 5.0  --  2.8  --   HGB 11.7* 12.9 11.2* 12.0  HCT 35.3* 38.7 34.8* 36.3  MCV 94.1 95.3 96.9 94.8  PLT 132* 129* 113* 129*   CBG:  Recent Labs Lab 06/20/13 0834  GLUCAP 111*    Recent Results (from the past 240 hour(s))  URINE CULTURE     Status: None   Collection Time    06/13/13  7:13 PM      Result Value Range Status   Specimen Description URINE, CATHETERIZED   Final   Special Requests NONE   Final   Culture  Setup Time     Final   Value: 06/14/2013 02:33     Performed at [facility]     Final   Value: NO GROWTH     Performed at [facility]   Culture     Final   Value: NO GROWTH     Performed at [facility]   Report Status 06/14/2013 FINAL   Final  CLOSTRIDIUM DIFFICILE BY PCR     Status: None   Collection Time    06/19/13  5:14 PM      Result Value Range Status   C difficile by pcr NEGATIVE  NEGATIVE Final  MRSA PCR SCREENING     Status: None   Collection Time    06/19/13  5:14 PM      Result Value Range Status   MRSA by PCR NEGATIVE  NEGATIVE Final   Comment:            The GeneXpert MRSA Assay (FDA     approved for NASAL specimens     only), is one component of a     comprehensive MRSA colonization     surveillance program. It is not     intended to diagnose MRSA     infection nor to guide or     monitor treatment for     MRSA infections.     Studies: Ir Fluoro Guide Cv Line Right  06/20/2013   CLINICAL DATA:  Encephalitis and need for tunneled catheter for  plasmapheresis.  EXAM: TUNNELED CENTRAL VENOUS CATHETER PLACEMENT WITH ULTRASOUND AND FLUOROSCOPIC GUIDANCE  MEDICATIONS: 1 g IV vancomycin. Vancomycin was given within two hours of incision. Vancomycin was given due to an antibiotic allergy.  ANESTHESIA/SEDATION: 2.0 mg IV Versed; 100 mcg IV Fentanyl.  Total Moderate Sedation Time  Twenty minutes.  FLUOROSCOPY TIME:  42 seconds.  PROCEDURE: The procedure, risks, benefits, and alternatives were explained to the patient. Questions regarding the procedure were encouraged and answered. The patient understands and consents to the procedure.  The right neck and chest were prepped with chlorhexidine in a sterile fashion, and a sterile drape was applied covering the operative field. Maximum barrier sterile technique with sterile gowns and gloves were used for the procedure. Local anesthesia was provided with 1% lidocaine.  Ultrasound was used to confirm patency of the right internal jugular vein. After creating a small venotomy incision, a 21 gauge needle was advanced into the right internal jugular vein under direct, real-time ultrasound guidance. Ultrasound image documentation was performed. After securing guidewire access, an 8 Fr dilator was placed. A J-wire was kinked to measure appropriate catheter length.  A Bard Equistream tunneled hemodialysis/pheresis catheter measuring 19 cm from tip to cuff was chosen for placement. This was tunneled in a retrograde fashion from the chest wall to the venotomy incision.  At the venotomy, serial dilatation was performed and a 16 Fr peel-away sheath was placed over a guidewire. The catheter was then placed through the sheath and the sheath removed. Final catheter positioning was confirmed and documented with a fluoroscopic spot image. The catheter was aspirated, flushed with saline, and injected with appropriate volume heparin dwells.  The venotomy incision was closed with subcutaneous 3-0 Monocryl and subcuticular 4-0 Vicryl.  Dermabond was applied to the incision. The catheter exit site was secured with 0-Prolene retention sutures.  COMPLICATIONS: None.  No pneumothorax.  FINDINGS: After catheter placement, the tips lie in the right atrium. The catheter aspirates normally and is ready for immediate use.  IMPRESSION: Placement of tunneled pheresis catheter via the right internal jugular vein. The catheter tips lie in the right atrium. The catheter is ready for immediate use.   Electronically Signed   By: Aletta Edouard M.D.   On: 06/20/2013 14:27   Ir Fluoro Guide Cv Line Right  06/18/2013   EXAM: ULTRASOUND AND FLUOROSCOPIC GUIDED PICC LINE INSERTION  MEDICATIONS: None.  TECHNIQUE: The procedure, risks, benefits, and alternatives were explained to the patient's son and informed written consent was obtained. A timeout was performed prior to the initiation of the procedure.  The right upper extremity was prepped with chlorhexidine in a sterile fashion, and a sterile drape was applied covering the operative field. Maximum barrier sterile technique with sterile gowns and gloves were used for the procedure. A timeout was performed prior to the initiation of the procedure. Local anesthesia was provided with 1% lidocaine.  Under direct ultrasound guidance, the right brachial vein was accessed with a micropuncture kit after the overlying soft tissues were anesthetized with 1% lidocaine. An ultrasound image was saved for documentation purposes. A guidewire was advanced to the level of the superior caval-atrial junction for measurement purposes and the PICC line was cut to length. A peel-away sheath was placed and a 36 cm, 5 [place], dual lumen was inserted to level of the superior caval-atrial junction. A post procedure spot fluoroscopic was obtained. The catheter easily aspirated and flushed and was sutured in place. A dressing was placed. The patient tolerated the procedure well without immediate post procedural complication.  CONTRAST:   None  FLUOROSCOPY TIME:  36 seconds.  COMPLICATIONS: None immediate  FINDINGS: After catheter placement, the tip lies within the superior cavoatrial junction. The catheter aspirates and flushes normally and is ready for immediate use.  IMPRESSION: Successful ultrasound and fluoroscopic guided placement of a right  brachial vein approach, 36 cm, 5 [place], dual lumen PICC with tip at the superior caval-atrial junction. The PICC line is ready for immediate use.  INDICATION: Poor venous access, in need of intravenous access for blood draws and medication administration   Electronically Signed   By: Sandi Mariscal M.D.   On: 06/18/2013 15:51   Ir US Guide Vasc Access Right  06/20/2013   CLINICAL DATA:  Encephalitis and need for tunneled catheter for plasmapheresis.  EXAM: TUNNELED CENTRAL VENOUS CATHETER PLACEMENT WITH ULTRASOUND AND FLUOROSCOPIC GUIDANCE  MEDICATIONS: 1 g IV vancomycin. Vancomycin was given within two hours of incision. Vancomycin was given due to an antibiotic allergy.  ANESTHESIA/SEDATION: 2.0 mg IV Versed; 100 mcg IV Fentanyl.  Total Moderate Sedation Time  Twenty minutes.  FLUOROSCOPY TIME:  42 seconds.  PROCEDURE: The procedure, risks, benefits, and alternatives were explained to the patient. Questions regarding the procedure were encouraged and answered. The patient understands and consents to the procedure.  The right neck and chest were prepped with chlorhexidine in a sterile fashion, and a sterile drape was applied covering the operative field. Maximum barrier sterile technique with sterile gowns and gloves were used for the procedure. Local anesthesia was provided with 1% lidocaine.  Ultrasound was used to confirm patency of the right internal jugular vein. After creating a small venotomy incision, a 21 gauge needle was advanced into the right internal jugular vein under direct, real-time ultrasound guidance. Ultrasound image documentation was performed. After securing guidewire access, an 8 Fr  dilator was placed. A J-wire was kinked to measure appropriate catheter length.  A Bard Equistream tunneled hemodialysis/pheresis catheter measuring 19 cm from tip to cuff was chosen for placement. This was tunneled in a retrograde fashion from the chest wall to the venotomy incision.  At the venotomy, serial dilatation was performed and a 16 Fr peel-away sheath was placed over a guidewire. The catheter was then placed through the sheath and the sheath removed. Final catheter positioning was confirmed and documented with a fluoroscopic spot image. The catheter was aspirated, flushed with saline, and injected with appropriate volume heparin dwells.  The venotomy incision was closed with subcutaneous 3-0 Monocryl and subcuticular 4-0 Vicryl. Dermabond was applied to the incision. The catheter exit site was secured with 0-Prolene retention sutures.  COMPLICATIONS: None.  No pneumothorax.  FINDINGS: After catheter placement, the tips lie in the right atrium. The catheter aspirates normally and is ready for immediate use.  IMPRESSION: Placement of tunneled pheresis catheter via the right internal jugular vein. The catheter tips lie in the right atrium. The catheter is ready for immediate use.   Electronically Signed   By: Aletta Edouard M.D.   On: 06/20/2013 14:27   Ir US Guide Vasc Access Right  06/18/2013   EXAM: ULTRASOUND AND FLUOROSCOPIC GUIDED PICC LINE INSERTION  MEDICATIONS: None.  TECHNIQUE: The procedure, risks, benefits, and alternatives were explained to the patient's son and informed written consent was obtained. A timeout was performed prior to the initiation of the procedure.  The right upper extremity was prepped with chlorhexidine in a sterile fashion, and a sterile drape was applied covering the operative field. Maximum barrier sterile technique with sterile gowns and gloves were used for the procedure. A timeout was performed prior to the initiation of the procedure. Local anesthesia was provided  with 1% lidocaine.  Under direct ultrasound guidance, the right brachial vein was accessed with a micropuncture kit after the overlying soft tissues were anesthetized with 1% lidocaine.  An ultrasound image was saved for documentation purposes. A guidewire was advanced to the level of the superior caval-atrial junction for measurement purposes and the PICC line was cut to length. A peel-away sheath was placed and a 36 cm, 5 [place], dual lumen was inserted to level of the superior caval-atrial junction. A post procedure spot fluoroscopic was obtained. The catheter easily aspirated and flushed and was sutured in place. A dressing was placed. The patient tolerated the procedure well without immediate post procedural complication.  CONTRAST:  None  FLUOROSCOPY TIME:  36 seconds.  COMPLICATIONS: None immediate  FINDINGS: After catheter placement, the tip lies within the superior cavoatrial junction. The catheter aspirates and flushes normally and is ready for immediate use.  IMPRESSION: Successful ultrasound and fluoroscopic guided placement of a right brachial vein approach, 36 cm, 5 [place], dual lumen PICC with tip at the superior caval-atrial junction. The PICC line is ready for immediate use.  INDICATION: Poor venous access, in need of intravenous access for blood draws and medication administration   Electronically Signed   By: Sandi Mariscal M.D.   On: 06/18/2013 15:51    Scheduled Meds: . atenolol  12.5 mg Oral Daily  . calcium gluconate IVPB  4 g Intravenous Once  . citrate dextrose      . fentaNYL      . heparin      . heparin  1,000 Units Intracatheter Once  . heparin  1,000 Units Intracatheter Once  . heparin  5,000 Units Subcutaneous Q8H  . levETIRAcetam  1,000 mg Oral BID  . methylPREDNISolone (SOLU-MEDROL) injection  500 mg Intravenous Q12H  . midazolam      . mirtazapine  30 mg Oral QHS  . sodium chloride  3 mL Intravenous Q12H  . thiamine  100 mg Oral Daily  . valproic acid  500 mg Oral TID    Continuous Infusions: . citrate dextrose      Principal Problem:   Acute encephalopathy Active Problems:   Convulsions/seizures   Toxic metabolic encephalopathy    Cameron Proud PA-S Imogene Burn, PA-C [facility] Pager [phone]. If 7PM-7AM, please contact night-coverage at [email or web address], password [facility] 06/20/2013, 2:38 PM  LOS: 1 day

## 2013-06-21 ENCOUNTER — Encounter (HOSPITAL_COMMUNITY): Payer: Self-pay | Admitting: Psychiatry

## 2013-06-21 ENCOUNTER — Inpatient Hospital Stay (HOSPITAL_COMMUNITY): Payer: Medicare Other

## 2013-06-21 DIAGNOSIS — R4182 Altered mental status, unspecified: Secondary | ICD-10-CM

## 2013-06-21 DIAGNOSIS — G9340 Encephalopathy, unspecified: Secondary | ICD-10-CM

## 2013-06-21 DIAGNOSIS — A048 Other specified bacterial intestinal infections: Secondary | ICD-10-CM

## 2013-06-21 DIAGNOSIS — F29 Unspecified psychosis not due to a substance or known physiological condition: Secondary | ICD-10-CM | POA: Diagnosis present

## 2013-06-21 LAB — POCT I-STAT, CHEM 8
BUN: 5 mg/dL — AB (ref 6–23)
CHLORIDE: 105 meq/L (ref 96–112)
Calcium, Ion: 1.3 mmol/L — ABNORMAL HIGH (ref 1.12–1.23)
Creatinine, Ser: 0.8 mg/dL (ref 0.50–1.10)
Glucose, Bld: 142 mg/dL — ABNORMAL HIGH (ref 70–99)
HCT: 37 % (ref 36.0–46.0)
Hemoglobin: 12.6 g/dL (ref 12.0–15.0)
POTASSIUM: 3.9 meq/L (ref 3.7–5.3)
SODIUM: 140 meq/L (ref 137–147)
TCO2: 21 mmol/L (ref 0–100)

## 2013-06-21 LAB — GLUCOSE, CAPILLARY: Glucose-Capillary: 109 mg/dL — ABNORMAL HIGH (ref 70–99)

## 2013-06-21 MED ORDER — BOOST / RESOURCE BREEZE PO LIQD: 1.0000 | Freq: Three times a day (TID) | ORAL | Status: DC

## 2013-06-21 MED ORDER — IOHEXOL 300 MG/ML  SOLN
75.0000 mL | Freq: Once | INTRAMUSCULAR | Status: AC | PRN
  Administered 2013-06-21: 75 mL via INTRAVENOUS

## 2013-06-21 MED ORDER — BOOST / RESOURCE BREEZE PO LIQD
1.0000 | Freq: Three times a day (TID) | ORAL | Status: DC
  Administered 2013-06-22 – 2013-06-24 (×8): 1 via ORAL

## 2013-06-21 MED ORDER — RISPERIDONE 0.5 MG PO TBDP
0.5000 mg | ORAL_TABLET | Freq: Two times a day (BID) | ORAL | Status: DC
  Administered 2013-06-21 – 2013-07-16 (×44): 0.5 mg via ORAL
  Filled 2013-06-21 (×52): qty 1

## 2013-06-21 MED ORDER — HYDRALAZINE HCL 20 MG/ML IJ SOLN
10.0000 mg | Freq: Four times a day (QID) | INTRAMUSCULAR | Status: DC | PRN
  Administered 2013-06-23: 10 mg via INTRAVENOUS
  Filled 2013-06-21 (×2): qty 1

## 2013-06-21 NOTE — Progress Notes (Signed)
Addendum  Patient seen and examined, chart and data base reviewed.  I agree with the above assessment and plan.  For full details please see Mrs. Algis DownsMarianne York PA note.  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   Clint LippsMutaz A Nataly Pacifico, MD [facility] Pager: [phone] 06/21/2013, 5:45 PM

## 2013-06-21 NOTE — Care Management Note (Unsigned)
    Page 1 of 2   07/15/2013     3:58:29 PM   CARE MANAGEMENT NOTE 07/15/2013  Patient:  Candida PeelingHUDSON,Titianna   Account Number:  [id number]  Date Initiated:  06/21/2013  Documentation initiated by:  Letha CapeAYLOR,DEBORAH  Subjective/Objective Assessment:   dx acute encephalopathy  admit- from [facility]     Action/Plan:   Anticipated DC Date:  07/15/2013   Anticipated DC Plan:  SKILLED NURSING FACILITY  In-house referral  Clinical Social Worker      DC Planning Services  CM consult      Choice offered to / List presented to:             Status of service:  In process, will continue to follow Medicare Important Message given?   (If response is "NO", the following Medicare IM given date fields will be blank) Date Medicare IM given:   Date Additional Medicare IM given:    Discharge Disposition:    Per UR Regulation:  Reviewed for med. necessity/level of care/duration of stay  If discussed at Long Length of Stay Meetings, dates discussed:   06/25/2013  06/27/2013  07/02/2013  07/04/2013  07/09/2013  07/16/2013    Comments:  07-15-13 still with diarrhea. tube feeds resumed, low k iv fluids supplement. IV abx. Plan SNF when stable. Tomi BambergerBrenda Graves-Bigelow, RN,BSN [phone]   07/10/13 Letha Capeeborah Taylor RN, BSN [phone] patient became hypostensive, tachycardic, febrile, transferred to SDU, still has flexiseal in, and s/p jtube reversal.  07/09/13 1715 Letha Capeeborah Taylor RN ,BSN [phone] patient still with flexiseal and having lots of stool. Plan is to return back to snf in couple days.  07/04/13- 1155- Donn PieriniKristi Webster RN, BSN [phone] start trickle TFs through J-tube today at 20cc/hr. D5 1/2 NS at 125 cc/hr-- Plan remains to return to SNF- when medically stable- CSW following for placement need  07/01/13- 1430- Donn PieriniKristi Webster RN, BSN [phone] Plan for jtube revsion in OR on 07/02/13-   06/26/13- 1620- Donn PieriniKristi Webster RN, BSN [phone] Plan for plasmapheresis again Friday then will re-evaluate- TF  started via jtube- [facility]

## 2013-06-21 NOTE — Progress Notes (Addendum)
Subjective: First pheresis yesterday was uneventful.   Exam: Filed Vitals:   06/21/13 1121  BP: 151/108  Pulse: 104  Temp: 97.7 F (36.5 C)  Resp: 18   Gen: In bed, NAD MS: Awake, alert, not oriented to place, does get correct month and year.  ZO:XWRUECN:PERRL, EOMI, VFF Motor: appears to have a right hemiparesis, has never fully cooperated with exams, but I do suspect that she is weak in the right arm and leg.  Sensory:intact to LT  Impression: 53 year old female with altered mental status that is persistent. I continue to think she was in status epilepticus for multiple days at presentation of the previous hospitalization. Given her continued hallucinations, and other concerning factors, autoimmune encephalitis is certainly a consideration. Her CSF panel was negative for this, however she will need serum panel sent as well. This will take some time to come back, and I would favor treating empirically in the interim with steroids and plasmapheresis.   Recommendations:  1) serum NMDA, [facility] paraneoplastic panel(includes voltage-gated potassium channel antibodies) (PENDING) 2) steroids, Solu-Medrol 500 mg twice a day x3 days, will stop tomorrow 3) plasma exchange, treatment every other day  4) given right hemiparesis, will check MRI C-spine.  5) eval for malignancy with CT chest and transvaginal ultrasound. (had CT abd/pelvis with previous hospitalization)  Ritta SlotMcNeill Herminia Warren, MD [facility] [phone]  If 7pm- 7am, please page neurology on call at [phone].

## 2013-06-21 NOTE — Progress Notes (Addendum)
INITIAL NUTRITION ASSESSMENT  DOCUMENTATION CODES Per approved criteria  -Not Applicable   INTERVENTION: Initiate 72-hour calorie count. Please hang calorie count envelope on the patient's door. Document percent consumed for each item on the patient's meal tray ticket and keep in envelope. Also document percent of any supplement or snack pt consumes and keep documentation in envelope for RD to review Monday. Continue Resource Breeze po TID, each supplement provides 250 kcal and 9 grams of protein. Recommend full assistance with all meals. Downgrade diet to Dysphagia 3 to help with oral intake/feeding. RD to continue to follow nutrition care plan.  NUTRITION DIAGNOSIS: Inadequate oral intake related to AMS as evidenced by limited oral intake.   Goal: Intake to meet >90% of estimated nutrition needs.  Monitor:  weight trends, lab trends, I/O's, PO intake, supplement tolerance  Reason for Assessment: Malnutrition Screening Tool  53 y.o. female  Admitting Dx: Acute encephalopathy  ASSESSMENT: PMHx significant for catatonia, HTN, anxiety, gastric bypass. From SNF.  Recent admission from 12/13 - 12/28 with toxic metabolic encephalopathy, hospital course was complicated with pyelonephritis and C. Difficile colitis. Pt was scheduled for PEG on 1/20, was found to be agitated and confused and was sent to ED.   Neuro saw pt on 1/21 and recommended plasma exchanges every other day as well as suppressive steroids to reduce her seizure foci.  Per CSW note from [facility], SW at [facility] reported that patient has not eaten since she was admitted to the facility. SW stated that the facility tried to get a PEG, but pt has a hx of gastric bypass, and SW was wondering if pt could get a J-tube during this admission. RD spoke with Marissa Calamity, PA-C regarding nutrition care plan. Per PA, pt will take PO's when prompted, question if pt needs artificial feeding vs more assistance with meals. Plan is for pt  to stay over the weekend, will initiate calorie count to help determine need for artificial feeding. RD to add oral nutrition supplements.  Pt would not awake during RD interview. Brief physical assessment revealed no muscle or fat mass loss/wasting. Pt appears well-nourished. Pt is at nutrition risk, however, if pt SNF social worker's report of inadequate intake at SNF is true. Discussed with PA.  Pt is currently consuming "bites" of meals, per RN. Current lunch tray untouched.  Current ordered for Remeron.  Height: Ht Readings from Last 1 Encounters:  06/20/13 5\' 3"  (1.6 m)    Weight: Wt Readings from Last 1 Encounters:  06/20/13 152 lb 1.9 oz (69 kg)    Ideal Body Weight: 115 lb  % Ideal Body Weight: 132%  Wt Readings from Last 10 Encounters:  06/20/13 152 lb 1.9 oz (69 kg)  05/17/13 154 lb 12.2 oz (70.2 kg)    Usual Body Weight: 154 lb   % Usual Body Weight: 99%  BMI:  Body mass index is 26.95 kg/(m^2). Overweight  Estimated Nutritional Needs: Kcal: 1600 - 1800 Protein: 75 - 90 g Fluid: 1.8 - 2 liters  Skin: intact  Diet Order: General  EDUCATION NEEDS: -No education needs identified at this time   Intake/Output Summary (Last 24 hours) at 06/21/13 1232 Last data filed at 06/21/13 0850  Gross per 24 hour  Intake    120 ml  Output      0 ml  Net    120 ml    Last BM: 1/22  Labs:   Recent Labs Lab 06/18/13 1143 06/19/13 1925 06/20/13 0515 06/20/13 1940  NA 141 142 141 140  K 4.0 3.6* 4.2 3.9  CL 104 109 106 105  CO2 20 21 20   --   BUN 8 6 6  5*  CREATININE 0.88 0.64 0.69 0.80  CALCIUM 8.8 8.3* 8.6  --   GLUCOSE 82 94 112* 142*    CBG (last 3)   Recent Labs  06/20/13 0834 06/21/13 0800  GLUCAP 111* 109*    Scheduled Meds: . atenolol  12.5 mg Oral Daily  . heparin  5,000 Units Subcutaneous Q8H  . levETIRAcetam  1,000 mg Oral BID  . methylPREDNISolone (SOLU-MEDROL) injection  500 mg Intravenous Q12H  . mirtazapine  30 mg Oral QHS   . sodium chloride  3 mL Intravenous Q12H  . thiamine  100 mg Oral Daily  . valproic acid  500 mg Oral TID    Continuous Infusions:   Past Medical History  Diagnosis Date  . Peripheral neuropathy   . Fibromyalgia   . Hypertension   . Anxiety   . Catatonia   . Seizures     Past Surgical History  Procedure Laterality Date  . Gastric bypass    . Abdominal hysterectomy      Jarold MottoSamantha Aliviya Schoeller MS, RD, LDN Pager: [phone] After-hours pager: [phone]

## 2013-06-21 NOTE — Progress Notes (Signed)
[facility] PROGRESS NOTE  Joan [name] JXB:[id number] DOB: Apr 27, 1961 DOA: 06/19/2013 PCP: [name], MD  HPI/Subjective: 53 yr old female presents with a CC of Altered mental status. Pt has a PMH of peripheral neuropathy, seizures, catatonia, anxiety, HTN, and fibromyalgia. Pt was previously admitted on 12/13 thru 12/28 with toxic metabolic encephalopathy.  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  EEG showed no recurrent seizures. A 24 hour EEG  showed an intermittent delta pattern felt to represent Frontal Intermittent Rhythmic Delta Activity. Pt was started on two antiepileptics.   This admission, she was again found altered and seemed "comatose" . Since d/c in December, pt has had persistent confusion and is confined to a wheelchair at [facility]. She is currently confused and experiencing visual hallucinations.  Pt was awake during the encounter.  Pt has not progressed since 06/20/13.  Was not able to elicit a Hx but did elicit leg pain.    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 -Pt received diatech catheter placement and plasmapheresis on 06/20/13.   -Plan is for plasmapheresis every other day and Solumedrol 500 mg bid x 3 days. D/C on 1/24. -MRI of the brain showed no acute abnormality.  Detailed results listed below. -NH4 level is ammonia. Pending:   - serum NMDA, [facility] paraneoplastic panel - CT chest and transvaginal ultrasound to rule out malignancy - MRI of the C-spine given right hemiparesis  Hx of Convulsions/Seizures  -Pt has a PMH of seizures and catatonia.   -MRI of the brain showed thalamic signal abnormality during previous admission.  No longer present on current MRI. -Pt currently taking Keppra and Depakene.  Increased Depakene to 500mg  tid -Valproic acid lvl 35.1 was low.  Re-check levels in the AM  Hx of Anxiety  -Continue pt on Remeron -Patient  appears to have had a psychiatric history  -Current illness is bringing out psychiatric symptoms. -Psychiatry consulted as psychotropic medications may help with her current symptoms. -Psych prescribed Risperdal .5 mg bid.  Hypertension  -Pt has a PMH of HTN and most recent BP reading was 151/108 -Likely secondary to anxiety and high dose steroids. -Continue atenolol and hydralazine PRN  Malnutrition with history of Gastric Bypass -Per SNF patient has had no PO intake for over 1 week prior to admission. -SNF sent her to hospital for J tube placement. -On my exam patient appears to drink if it is held to her mouth. -Nutrition consulted. -Speech consulted. -Will check pre-albumin to determine if patient may need TNA. -Reassured SNF that patient will not be sent back unless she has some method of taking nutrition.  Recent hx of c-diff (05/18/13)  -Pt negative for C-diff at this admit   DVT Prophylaxis:  IV Heparin  Code Status: Full Code Family Communication: Son was contacted to explain procedures and status Disposition Plan: D/C to SNF when medically appropriate   Consultants:  Psych  Neuro  Procedures:  Plasmapheresis  PICC Line Placement  Antibiotics:  None  Objective: Filed Vitals:   06/20/13 2213 06/21/13 0431 06/21/13 0731 06/21/13 1121  BP: 145/99 145/94 146/98 151/108  Pulse: 106 115 124 104  Temp: 97.9 F (36.6 C) 98.9 F (37.2 C) 97.3 F (36.3 C) 97.7 F (36.5 C)  TempSrc: Oral Oral Oral Oral  Resp: 18 18 18 18   Height:      Weight:      SpO2: 100% 100% 100% 100%  Intake/Output Summary (Last 24 hours) at 06/21/13 1505 Last data filed at 06/21/13 0850  Gross per 24 hour  Intake    120 ml  Output      0 ml  Net    120 ml   Filed Weights   06/19/13 1622 06/20/13 0626  Weight: 69 kg (152 lb 1.9 oz) 69 kg (152 lb 1.9 oz)    Exam: General: awake and alert, frightful, appears mild distress, rambling HEENT: PERRLA, EOMI, Anicteic Sclera,   Neck: Supple, no JVD, no masses, no lymphadenopathy  Cardiovascular:  tachycardic rhythm, S1 S2 auscultated, no rubs, murmurs or gallops.  Respiratory: Clear to auscultation bilaterally with equal chest rise  Abdomen: Soft, nontender, nondistended, + bowel sounds  Extremities: No swelling, or cyanosis. Pain in the legs elicited  Neuro: Diminished strength equally in all 4 ext.  Skin: No scars or scratches observed  Psych: Abnormal affect, pt appears frightened, rambling, but will follow commands.     Data Reviewed: Basic Metabolic Panel:  Recent Labs Lab 06/18/13 1143 06/19/13 1925 06/20/13 0515 06/20/13 1940  NA 141 142 141 140  K 4.0 3.6* 4.2 3.9  CL 104 109 106 105  CO2 20 21 20   --   GLUCOSE 82 94 112* 142*  BUN 8 6 6  5*  CREATININE 0.88 0.64 0.69 0.80  CALCIUM 8.8 8.3* 8.6  --    Liver Function Tests:  Recent Labs Lab 06/18/13 1143 06/19/13 1925 06/20/13 0515  AST 30 23 25   ALT 11 9 9   ALKPHOS 43 35* 38*  BILITOT 0.4 0.4 0.4  PROT 6.7 5.9* 6.3  ALBUMIN 3.1* 2.6* 2.7*    Recent Labs Lab 06/19/13 1925  AMMONIA 24   CBC:  Recent Labs Lab 06/18/13 1143 06/19/13 1925 06/20/13 0515 06/20/13 1940  WBC 7.0 5.0 2.3*  --   NEUTROABS  --  2.8  --   --   HGB 12.9 11.2* 12.0 12.6  HCT 38.7 34.8* 36.3 37.0  MCV 95.3 96.9 94.8  --   PLT 129* 113* 129*  --    CBG:  Recent Labs Lab 06/20/13 0834 06/21/13 0800  GLUCAP 111* 109*    Recent Results (from the past 240 hour(s))  URINE CULTURE     Status: None   Collection Time    06/13/13  7:13 PM      Result Value Range Status   Specimen Description URINE, CATHETERIZED   Final   Special Requests NONE   Final   Culture  Setup Time     Final   Value: 06/14/2013 02:33     Performed at [facility] Count     Final   Value: NO GROWTH     Performed at [facility]   Culture     Final   Value: NO GROWTH     Performed at [facility]   Report Status 06/14/2013 FINAL    Final  CLOSTRIDIUM DIFFICILE BY PCR     Status: None   Collection Time    06/19/13  5:14 PM      Result Value Range Status   C difficile by pcr NEGATIVE  NEGATIVE Final  MRSA PCR SCREENING     Status: None   Collection Time    06/19/13  5:14 PM      Result Value Range Status   MRSA by PCR NEGATIVE  NEGATIVE Final   Comment:  The GeneXpert MRSA Assay (FDA     approved for NASAL specimens     only), is one component of a     comprehensive MRSA colonization     surveillance program. It is not     intended to diagnose MRSA     infection nor to guide or     monitor treatment for     MRSA infections.  STOOL CULTURE     Status: None   Collection Time    06/19/13  5:14 PM      Result Value Range Status   Specimen Description STOOL   Final   Special Requests NONE   Final   Culture     Final   Value: NO SUSPICIOUS COLONIES, CONTINUING TO HOLD     Performed at [facility]   Report Status PENDING   Incomplete     Studies: Mr Laqueta Jean Wo Contrast  25-Jun-2013   CLINICAL DATA:  Altered mental status. Possible autoimmune encephalitis.  EXAM: MRI HEAD WITHOUT AND WITH CONTRAST  TECHNIQUE: Multiplanar, multiecho pulse sequences of the brain and surrounding structures were obtained without and with intravenous contrast.  CONTRAST:  15mL MULTIHANCE GADOBENATE DIMEGLUMINE 529 MG/ML IV SOLN  COMPARISON:  Head CT 06/13/2013 and brain MRI 05/16/2013  FINDINGS: Images are mildly to moderately degraded by motion artifact.  Incidental note is again made of a partially empty sella. No definite residual abnormal T2 or diffusion weighted signal is identified in the thalami. Mild periventricular T2 hyperintensity is unchanged and nonspecific. No new areas of brain parenchymal signal abnormality are identified. There is mild cerebral atrophy. There is no evidence of acute infarct. There is no evidence of mass, midline shift, or extra-axial fluid collection. Orbits are unremarkable. Major  intracranial vascular flow voids are unremarkable. Visualized paranasal sinuses and mastoid air cells are clear. There is no abnormal enhancement.  IMPRESSION: Interval resolution of thalamic signal abnormality. No evidence of new intracranial abnormality.   Electronically Signed   By: Sebastian Ache   On: June 25, 2013 17:26   Ir Fluoro Guide Cv Line Right  25-Jun-2013   CLINICAL DATA:  Encephalitis and need for tunneled catheter for plasmapheresis.  EXAM: TUNNELED CENTRAL VENOUS CATHETER PLACEMENT WITH ULTRASOUND AND FLUOROSCOPIC GUIDANCE  MEDICATIONS: 1 g IV vancomycin. Vancomycin was given within two hours of incision. Vancomycin was given due to an antibiotic allergy.  ANESTHESIA/SEDATION: 2.0 mg IV Versed; 100 mcg IV Fentanyl.  Total Moderate Sedation Time  Twenty minutes.  FLUOROSCOPY TIME:  42 seconds.  PROCEDURE: The procedure, risks, benefits, and alternatives were explained to the patient. Questions regarding the procedure were encouraged and answered. The patient understands and consents to the procedure.  The right neck and chest were prepped with chlorhexidine in a sterile fashion, and a sterile drape was applied covering the operative field. Maximum barrier sterile technique with sterile gowns and gloves were used for the procedure. Local anesthesia was provided with 1% lidocaine.  Ultrasound was used to confirm patency of the right internal jugular vein. After creating a small venotomy incision, a 21 gauge needle was advanced into the right internal jugular vein under direct, real-time ultrasound guidance. Ultrasound image documentation was performed. After securing guidewire access, an 8 Fr dilator was placed. A J-wire was kinked to measure appropriate catheter length.  A Bard Equistream tunneled hemodialysis/pheresis catheter measuring 19 cm from tip to cuff was chosen for placement. This was tunneled in a retrograde fashion from the chest wall to the venotomy incision.  At the venotomy, serial  dilatation was performed and a 16 Fr peel-away sheath was placed over a guidewire. The catheter was then placed through the sheath and the sheath removed. Final catheter positioning was confirmed and documented with a fluoroscopic spot image. The catheter was aspirated, flushed with saline, and injected with appropriate volume heparin dwells.  The venotomy incision was closed with subcutaneous 3-0 Monocryl and subcuticular 4-0 Vicryl. Dermabond was applied to the incision. The catheter exit site was secured with 0-Prolene retention sutures.  COMPLICATIONS: None.  No pneumothorax.  FINDINGS: After catheter placement, the tips lie in the right atrium. The catheter aspirates normally and is ready for immediate use.  IMPRESSION: Placement of tunneled pheresis catheter via the right internal jugular vein. The catheter tips lie in the right atrium. The catheter is ready for immediate use.   Electronically Signed   By: Irish LackGlenn  Yamagata M.D.   On: 06/20/2013 14:27   Ir [place]as Guide Vasc Access Right  06/20/2013   CLINICAL DATA:  Encephalitis and need for tunneled catheter for plasmapheresis.  EXAM: TUNNELED CENTRAL VENOUS CATHETER PLACEMENT WITH ULTRASOUND AND FLUOROSCOPIC GUIDANCE  MEDICATIONS: 1 g IV vancomycin. Vancomycin was given within two hours of incision. Vancomycin was given due to an antibiotic allergy.  ANESTHESIA/SEDATION: 2.0 mg IV Versed; 100 mcg IV Fentanyl.  Total Moderate Sedation Time  Twenty minutes.  FLUOROSCOPY TIME:  42 seconds.  PROCEDURE: The procedure, risks, benefits, and alternatives were explained to the patient. Questions regarding the procedure were encouraged and answered. The patient understands and consents to the procedure.  The right neck and chest were prepped with chlorhexidine in a sterile fashion, and a sterile drape was applied covering the operative field. Maximum barrier sterile technique with sterile gowns and gloves were used for the procedure. Local anesthesia was provided with 1%  lidocaine.  Ultrasound was used to confirm patency of the right internal jugular vein. After creating a small venotomy incision, a 21 gauge needle was advanced into the right internal jugular vein under direct, real-time ultrasound guidance. Ultrasound image documentation was performed. After securing guidewire access, an 8 Fr dilator was placed. A J-wire was kinked to measure appropriate catheter length.  A Bard Equistream tunneled hemodialysis/pheresis catheter measuring 19 cm from tip to cuff was chosen for placement. This was tunneled in a retrograde fashion from the chest wall to the venotomy incision.  At the venotomy, serial dilatation was performed and a 16 Fr peel-away sheath was placed over a guidewire. The catheter was then placed through the sheath and the sheath removed. Final catheter positioning was confirmed and documented with a fluoroscopic spot image. The catheter was aspirated, flushed with saline, and injected with appropriate volume heparin dwells.  The venotomy incision was closed with subcutaneous 3-0 Monocryl and subcuticular 4-0 Vicryl. Dermabond was applied to the incision. The catheter exit site was secured with 0-Prolene retention sutures.  COMPLICATIONS: None.  No pneumothorax.  FINDINGS: After catheter placement, the tips lie in the right atrium. The catheter aspirates normally and is ready for immediate use.  IMPRESSION: Placement of tunneled pheresis catheter via the right internal jugular vein. The catheter tips lie in the right atrium. The catheter is ready for immediate use.   Electronically Signed   By: Irish LackGlenn  Yamagata M.D.   On: 06/20/2013 14:27    Scheduled Meds: . atenolol  12.5 mg Oral Daily  . feeding supplement (RESOURCE BREEZE)  1 Container Oral TID BM  . heparin  5,000 Units Subcutaneous Q8H  .  levETIRAcetam  1,000 mg Oral BID  . methylPREDNISolone (SOLU-MEDROL) injection  500 mg Intravenous Q12H  . mirtazapine  30 mg Oral QHS  . risperiDONE  0.5 mg Oral BID  .  sodium chloride  3 mL Intravenous Q12H  . thiamine  100 mg Oral Daily  . valproic acid  500 mg Oral TID   Continuous Infusions:   Principal Problem:   Acute encephalopathy Active Problems:   Convulsions/seizures   Toxic metabolic encephalopathy   Psychosis    Horris Latino PA-S  Algis Downs, PA-C [facility] Pager [phone]. If 7PM-7AM, please contact night-coverage at [email or web address], password [facility] 06/21/2013, 3:05 PM  LOS: 2 days

## 2013-06-21 NOTE — Consult Note (Signed)
[facility] Face-to-Face Psychiatry Consult   Reason for Consult:  Psychosis Referring Physician:  Dr. Orlena [name] is an 53 y.o. female.  Assessment: AXIS I:  Anxiety Disorder NOS and Psychotic Disorder NOS AXIS II:  Deferred AXIS [name]:   Past Medical History  Diagnosis Date  . Peripheral neuropathy   . Fibromyalgia   . Hypertension   . Anxiety   . Catatonia   . Seizures    AXIS IV:  other psychosocial or environmental problems, problems related to social environment and problems with primary support group AXIS V:  21-30 behavior considerably influenced by delusions or hallucinations OR serious impairment in judgment, communication OR inability to function in almost all areas  Plan:  Continue with medical treatment and add Risperdal 0.5 mg BID PRN psychosis.  Subjective:   Joan [name] is a 53 y.o. female patient is afraid and paranoid when awake.  HPI:  Patient was only response was to open her eyes with her eyes rolled back.  She is currently suffering from encephalopathy, most likely metabolic.  When she is aroused, she is afraid, paranoid, and rambles.  She is currently taking large amounts of steroids for inflammation of her brain along with plasmaphoreisis.  Total care patient at this time.  Risperdal 0.5 mg BID PRN psychosis recommended.  Dr. Dwyane Dee has reviewed this patient and concurs with plan. HPI Elements:   Location:  generalized. Quality:  acute. Severity:  severe. Timing:  constant. Duration:  month. Context:  metabolic encephalopathy, complicated UTI, and other medical issues.  Past Psychiatric History: Past Medical History  Diagnosis Date  . Peripheral neuropathy   . Fibromyalgia   . Hypertension   . Anxiety   . Catatonia   . Seizures     reports that she has never smoked. She does not have any smokeless tobacco history on file. She reports that she does not drink alcohol. Her drug history is not on file. Family History  Problem Relation Age of Onset  .  Hypertension Mother   . Diabetes Father            Allergies:   Allergies  Allergen Reactions  . Morphine And Related Other (See Comments)    REACTION: Causes pain  . Paroxetine Hcl Other (See Comments)    REACTION:  unknown  . Penicillins Other (See Comments)    REACTION: Makes her body hurt.  . Sertraline Hcl Other (See Comments)    REACTION:  unknown     Objective: Blood pressure 151/108, pulse 104, temperature 97.7 F (36.5 C), temperature source Oral, resp. rate 18, height 5' 3"  (1.6 m), weight 152 lb 1.9 oz (69 kg), SpO2 100.00%.Body mass index is 26.95 kg/(m^2). Results for orders placed during the hospital encounter of 06/19/13 (from the past 72 hour(s))  CLOSTRIDIUM DIFFICILE BY PCR     Status: None   Collection Time    06/19/13  5:14 PM      Result Value Range   C difficile by pcr NEGATIVE  NEGATIVE  MRSA PCR SCREENING     Status: None   Collection Time    06/19/13  5:14 PM      Result Value Range   MRSA by PCR NEGATIVE  NEGATIVE   Comment:            The GeneXpert MRSA Assay (FDA     approved for NASAL specimens     only), is one component of a     comprehensive MRSA colonization  surveillance program. It is not     intended to diagnose MRSA     infection nor to guide or     monitor treatment for     MRSA infections.  STOOL CULTURE     Status: None   Collection Time    06/19/13  5:14 PM      Result Value Range   Specimen Description STOOL     Special Requests NONE     Culture       Value: NO SUSPICIOUS COLONIES, CONTINUING TO HOLD     Performed at [facility]   Report Status PENDING    FECAL LACTOFERRIN     Status: None   Collection Time    06/19/13  5:14 PM      Result Value Range   Specimen Description STOOL     Special Requests NONE     Fecal Lactoferrin       Value: NEGATIVE     Performed at [facility]   Report Status 06/20/2013 FINAL    GI PATHOGEN PANEL BY PCR, STOOL     Status: None   Collection Time     06/19/13  5:14 PM      Result Value Range   Campylobacter by PCR Negative     C difficile toxin A/B Negative     E coli 0157 by PCR Negative     E coli (ETEC) LT/ST Negative     E coli (STEC) Negative     Salmonella by PCR Negative     Shigella by PCR Negative     Norovirus G!/G2 Negative     Rotavirus A by PCR Positive     G lamblia by PCR Negative     Cryptosporidium by PCR Negative     Comment: (NOTE)      ** Normal Reference Range for each Analyte: Not Detected **           The detection and identification of specific gastrointestinal     microbial nucleic acid from individuals exhibiting signs and     symptoms of gastrointestinal infection aids in the diagnosis     of gastrointestinal infection when used in conjunction with     clinical evaluation, laboratory findings, and epidemiological     information.     ** The xTAG Gastrointestinal Pathogen Panel results are presumptive     and must be confirmed by FDA-cleared tests or other acceptable     reference methods.  The results of this test should not be used as     the sole basis for diagnosis, treatment, or other patient management     decisions.     Performed using the [employer] xTAG Gastrointestinal Pathogen Panel test     kit.     Performed at [place]     Status: Abnormal   Collection Time    06/19/13  7:25 PM      Result Value Range   Sodium 142  137 - 147 mEq/L   Potassium 3.6 (*) 3.7 - 5.3 mEq/L   Chloride 109  96 - 112 mEq/L   CO2 21  19 - 32 mEq/L   Glucose, Bld 94  70 - 99 mg/dL   BUN 6  6 - 23 mg/dL   Creatinine, Ser 0.64  0.50 - 1.10 mg/dL   Calcium 8.3 (*) 8.4 - 10.5 mg/dL   Total Protein 5.9 (*) 6.0 - 8.3 g/dL   Albumin 2.6 (*)  3.5 - 5.2 g/dL   AST 23  0 - 37 U/L   ALT 9  0 - 35 U/L   Alkaline Phosphatase 35 (*) 39 - 117 U/L   Total Bilirubin 0.4  0.3 - 1.2 mg/dL   GFR calc non Af Amer >90  >90 mL/min   GFR calc Af Amer >90  >90 mL/min   Comment: (NOTE)      The eGFR has been calculated using the CKD EPI equation.     This calculation has not been validated in all clinical situations.     eGFR's persistently <90 mL/min signify possible Chronic Kidney     Disease.  CBC WITH DIFFERENTIAL     Status: Abnormal   Collection Time    06/19/13  7:25 PM      Result Value Range   WBC 5.0  4.0 - 10.5 K/uL   RBC 3.59 (*) 3.87 - 5.11 MIL/uL   Hemoglobin 11.2 (*) 12.0 - 15.0 g/dL   HCT 34.8 (*) 36.0 - 46.0 %   MCV 96.9  78.0 - 100.0 fL   MCH 31.2  26.0 - 34.0 pg   MCHC 32.2  30.0 - 36.0 g/dL   RDW 17.6 (*) 11.5 - 15.5 %   Platelets 113 (*) 150 - 400 K/uL   Comment: CONSISTENT WITH PREVIOUS RESULT   Neutrophils Relative % 57  43 - 77 %   Lymphocytes Relative 33  12 - 46 %   Monocytes Relative 10  3 - 12 %   Eosinophils Relative 0  0 - 5 %   Basophils Relative 0  0 - 1 %   Band Neutrophils 0  0 - 10 %   Metamyelocytes Relative 0     Myelocytes 0     Promyelocytes Absolute 0     Blasts 0     nRBC 0  0 /100 WBC   Neutro Abs 2.8  1.7 - 7.7 K/uL   Lymphs Abs 1.7  0.7 - 4.0 K/uL   Monocytes Absolute 0.5  0.1 - 1.0 K/uL   Eosinophils Absolute 0.0  0.0 - 0.7 K/uL   Basophils Absolute 0.0  0.0 - 0.1 K/uL   WBC Morphology ATYPICAL LYMPHOCYTES    VALPROIC ACID LEVEL     Status: Abnormal   Collection Time    06/19/13  7:25 PM      Result Value Range   Valproic Acid Lvl 35.1 (*) 50.0 - 100.0 ug/mL  LEVETIRACETAM LEVEL     Status: None   Collection Time    06/19/13  7:25 PM      Result Value Range   Levetiracetam Lvl 26.0  5.0 - 30.0 ug/mL   Comment: Performed at [place]     Status: None   Collection Time    06/19/13  7:25 PM      Result Value Range   Ammonia 24  11 - 60 umol/L  COMPREHENSIVE METABOLIC PANEL     Status: Abnormal   Collection Time    06/20/13  5:15 AM      Result Value Range   Sodium 141  137 - 147 mEq/L   Potassium 4.2  3.7 - 5.3 mEq/L   Chloride 106  96 - 112 mEq/L   CO2 20  19 - 32 mEq/L   Glucose,  Bld 112 (*) 70 - 99 mg/dL   BUN 6  6 - 23 mg/dL   Creatinine, Ser 0.69  0.50 - 1.10 mg/dL  Calcium 8.6  8.4 - 10.5 mg/dL   Total Protein 6.3  6.0 - 8.3 g/dL   Albumin 2.7 (*) 3.5 - 5.2 g/dL   AST 25  0 - 37 U/L   ALT 9  0 - 35 U/L   Alkaline Phosphatase 38 (*) 39 - 117 U/L   Total Bilirubin 0.4  0.3 - 1.2 mg/dL   GFR calc non Af Amer >90  >90 mL/min   GFR calc Af Amer >90  >90 mL/min   Comment: (NOTE)     The eGFR has been calculated using the CKD EPI equation.     This calculation has not been validated in all clinical situations.     eGFR's persistently <90 mL/min signify possible Chronic Kidney     Disease.  CBC     Status: Abnormal   Collection Time    06/20/13  5:15 AM      Result Value Range   WBC 2.3 (*) 4.0 - 10.5 K/uL   RBC 3.83 (*) 3.87 - 5.11 MIL/uL   Hemoglobin 12.0  12.0 - 15.0 g/dL   HCT 36.3  36.0 - 46.0 %   MCV 94.8  78.0 - 100.0 fL   MCH 31.3  26.0 - 34.0 pg   MCHC 33.1  30.0 - 36.0 g/dL   RDW 17.4 (*) 11.5 - 15.5 %   Platelets 129 (*) 150 - 400 K/uL  PROTIME-INR     Status: Abnormal   Collection Time    06/20/13  5:15 AM      Result Value Range   Prothrombin Time 16.1 (*) 11.6 - 15.2 seconds   INR 1.32  0.00 - 1.49  GLUCOSE, CAPILLARY     Status: Abnormal   Collection Time    06/20/13  8:34 AM      Result Value Range   Glucose-Capillary 111 (*) 70 - 99 mg/dL   Comment 1 Documented in Chart     Comment 2 Notify RN    POCT I-STAT, CHEM 8     Status: Abnormal   Collection Time    06/20/13  7:40 PM      Result Value Range   Sodium 140  137 - 147 mEq/L   Potassium 3.9  3.7 - 5.3 mEq/L   Chloride 105  96 - 112 mEq/L   BUN 5 (*) 6 - 23 mg/dL   Creatinine, Ser 0.80  0.50 - 1.10 mg/dL   Glucose, Bld 142 (*) 70 - 99 mg/dL   Calcium, Ion 1.30 (*) 1.12 - 1.23 mmol/L   TCO2 21  0 - 100 mmol/L   Hemoglobin 12.6  12.0 - 15.0 g/dL   HCT 37.0  36.0 - 46.0 %  GLUCOSE, CAPILLARY     Status: Abnormal   Collection Time    06/21/13  8:00 AM      Result Value  Range   Glucose-Capillary 109 (*) 70 - 99 mg/dL   Comment 1 Documented in Chart     Comment 2 Notify RN     Labs are reviewed and are pertinent for multiple medical issues.  Current Facility-Administered Medications  Medication Dose Route Frequency Provider Last Rate Last Dose  . acetaminophen (TYLENOL) tablet 650 mg  650 mg Oral Q6H PRN Gardiner Barefoot, NP   650 mg at 06/20/13 0906  . atenolol (TENORMIN) tablet 12.5 mg  12.5 mg Oral Daily Nita Sells, MD   12.5 mg at 06/21/13 0928  . feeding supplement (RESOURCE BREEZE) (RESOURCE BREEZE) liquid  1 Container  1 Container Oral TID BM Bobby Rumpf [place], PA-C      . heparin injection 5,000 Units  5,000 Units Subcutaneous Q8H Nita Sells, MD   5,000 Units at 06/21/13 0523  . hydrALAZINE (APRESOLINE) injection 10 mg  10 mg Intravenous Q6H PRN Melton Alar, PA-C      . levETIRAcetam (KEPPRA) tablet 1,000 mg  1,000 mg Oral BID Nita Sells, MD   1,000 mg at 06/21/13 0928  . methylPREDNISolone sodium succinate (SOLU-MEDROL) 500 mg in sodium chloride 0.9 % 50 mL IVPB  500 mg Intravenous Q12H Roland Rack, MD   500 mg at 06/21/13 0727  . mirtazapine (REMERON SOL-TAB) disintegrating tablet 30 mg  30 mg Oral QHS Nita Sells, MD   30 mg at 06/20/13 2200  . sodium chloride 0.9 % injection 10-40 mL  10-40 mL Intracatheter PRN Nita Sells, MD   10 mL at 06/21/13 0921  . sodium chloride 0.9 % injection 3 mL  3 mL Intravenous Q12H Nita Sells, MD      . thiamine (VITAMIN B-1) tablet 100 mg  100 mg Oral Daily Nita Sells, MD   100 mg at 06/21/13 0928  . valproic acid (DEPAKENE) 250 MG capsule 500 mg  500 mg Oral TID Melton Alar, PA-C   500 mg at 06/21/13 2641    Psychiatric Specialty Exam:     Blood pressure 151/108, pulse 104, temperature 97.7 F (36.5 C), temperature source Oral, resp. rate 18, height 5' 3"  (1.6 m), weight 152 lb 1.9 oz (69 kg), SpO2 100.00%.Body mass index is 26.95  kg/(m^2).  General Appearance: Disheveled  Eye Sport and exercise psychologist::  None  Speech:  NA  Volume:  NA  Mood:  NA  Affect:  unable to arouse  Thought Process:  NA  Orientation:  NA  Thought Content:  NA  Suicidal Thoughts:  Non-verbal  Homicidal Thoughts:  Non-verbal  Memory:  Cannot assess  Judgement:  Cannot assess  Insight:  Cannot assess  Psychomotor Activity:  Decreased  Concentration:  Unable to assess  Recall:  Unable to assess  Akathisia:  Unable to assess  Handed:  Unable to assess  AIMS (if indicated):     Assets:  Leisure Time Social Support  Sleep:      Treatment Plan Summary: Daily contact with patient to assess and evaluate symptoms and progress in treatment Medication management--Risperdal 0.5 mg BID PRN psychosis recommended.  Waylan Boga, PMH-NP 06/21/2013 2:42 PM

## 2013-06-22 ENCOUNTER — Inpatient Hospital Stay (HOSPITAL_COMMUNITY): Payer: Medicare Other

## 2013-06-22 LAB — CBC
HCT: 31.9 % — ABNORMAL LOW (ref 36.0–46.0)
Hemoglobin: 10.6 g/dL — ABNORMAL LOW (ref 12.0–15.0)
MCH: 31.2 pg (ref 26.0–34.0)
MCHC: 33.2 g/dL (ref 30.0–36.0)
MCV: 93.8 fL (ref 78.0–100.0)
PLATELETS: 101 10*3/uL — AB (ref 150–400)
RBC: 3.4 MIL/uL — AB (ref 3.87–5.11)
RDW: 17.7 % — AB (ref 11.5–15.5)
WBC: 6.1 10*3/uL (ref 4.0–10.5)

## 2013-06-22 LAB — BASIC METABOLIC PANEL
BUN: 10 mg/dL (ref 6–23)
CALCIUM: 8.8 mg/dL (ref 8.4–10.5)
CO2: 21 mEq/L (ref 19–32)
Chloride: 109 mEq/L (ref 96–112)
Creatinine, Ser: 0.66 mg/dL (ref 0.50–1.10)
GFR calc Af Amer: >90 mL/min (ref >90)
Glucose, Bld: 115 mg/dL — ABNORMAL HIGH (ref 70–99)
Potassium: 4 mEq/L (ref 3.7–5.3)
SODIUM: 143 meq/L (ref 137–147)

## 2013-06-22 LAB — GLUCOSE, CAPILLARY: GLUCOSE-CAPILLARY: 118 mg/dL — AB (ref 70–99)

## 2013-06-22 LAB — POCT I-STAT, CHEM 8
BUN: 10 mg/dL (ref 6–23)
CREATININE: 0.8 mg/dL (ref 0.50–1.10)
Calcium, Ion: 1.31 mmol/L — ABNORMAL HIGH (ref 1.12–1.23)
Chloride: 107 mEq/L (ref 96–112)
Glucose, Bld: 110 mg/dL — ABNORMAL HIGH (ref 70–99)
HEMATOCRIT: 32 % — AB (ref 36.0–46.0)
Hemoglobin: 10.9 g/dL — ABNORMAL LOW (ref 12.0–15.0)
Potassium: 3.9 mEq/L (ref 3.7–5.3)
SODIUM: 145 meq/L (ref 137–147)
TCO2: 21 mmol/L (ref 0–100)

## 2013-06-22 LAB — VALPROIC ACID LEVEL: Valproic Acid Lvl: 76.6 ug/mL (ref 50.0–100.0)

## 2013-06-22 LAB — PREALBUMIN: Prealbumin: 15.8 mg/dL — ABNORMAL LOW (ref 17.0–34.0)

## 2013-06-22 MED ORDER — SODIUM CHLORIDE 0.9 % IV SOLN
4.0000 g | Freq: Once | INTRAVENOUS | Status: AC
  Administered 2013-06-22: 4 g via INTRAVENOUS
  Filled 2013-06-22: qty 40

## 2013-06-22 MED ORDER — ACETAMINOPHEN 325 MG PO TABS: 650.0000 mg | ORAL_TABLET | ORAL | Status: DC | PRN

## 2013-06-22 MED ORDER — ACD FORMULA A 0.73-2.45-2.2 GM/100ML VI SOLN
Status: AC
  Administered 2013-06-22: 10:00:00
  Filled 2013-06-22: qty 500

## 2013-06-22 MED ORDER — CALCIUM CARBONATE ANTACID 500 MG PO CHEW
2.0000 | CHEWABLE_TABLET | ORAL | Status: DC
  Administered 2013-06-22: 400 mg via ORAL

## 2013-06-22 MED ORDER — SODIUM CHLORIDE 0.9 % IV SOLN
INTRAVENOUS | Status: AC
  Administered 2013-06-22 (×3): via INTRAVENOUS_CENTRAL
  Filled 2013-06-22 (×3): qty 200

## 2013-06-22 MED ORDER — ACD FORMULA A 0.73-2.45-2.2 GM/100ML VI SOLN
Status: AC
  Administered 2013-06-22: 09:00:00
  Filled 2013-06-22: qty 500

## 2013-06-22 MED ORDER — HEPARIN SODIUM (PORCINE) 1000 UNIT/ML IJ SOLN: 1000.0000 [IU] | Freq: Once | INTRAMUSCULAR | Status: DC

## 2013-06-22 MED ORDER — ACD FORMULA A 0.73-2.45-2.2 GM/100ML VI SOLN
500.0000 mL | Status: DC
  Filled 2013-06-22: qty 500

## 2013-06-22 MED ORDER — CALCIUM CARBONATE ANTACID 500 MG PO CHEW
CHEWABLE_TABLET | ORAL | Status: AC
  Filled 2013-06-22: qty 2

## 2013-06-22 MED ORDER — FOLIC ACID 5 MG/ML IJ SOLN
1.0000 mg | Freq: Every day | INTRAMUSCULAR | Status: DC
  Administered 2013-06-22 – 2013-06-23 (×2): 1 mg via SUBCUTANEOUS
  Filled 2013-06-22 (×2): qty 0.2

## 2013-06-22 NOTE — Progress Notes (Signed)
[facility] PROGRESS NOTE  Joan [name] WUJ:[id number] DOB: 1961-01-20 DOA: 06/19/2013 PCP: [name], MD  HPI/Subjective: 53 yr old female presents with a CC of Altered mental status. Pt has a PMH of peripheral neuropathy, seizures, catatonia, anxiety, HTN, and fibromyalgia. Pt was previously admitted on 12/13 thru 12/28 with toxic metabolic encephalopathy.  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  EEG showed no recurrent seizures. A 24 hour EEG  showed an intermittent delta pattern felt to represent Frontal Intermittent Rhythmic Delta Activity. Pt was started on two antiepileptics.  This admission, she was again found altered and seemed "comatose" . Since d/c in December, pt has had persistent confusion and is confined to a wheelchair at [facility]. She is currently confused and experiencing visual hallucinations.   Patient is more awake and conversational today, complaining about left leg pain.    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 -Pt received diatech catheter placement and plasmapheresis on 06/20/13.   -Plan is for plasmapheresis every other day and Solumedrol 500 mg bid x 3 days. D/C on 1/24. -MRI of the brain showed no acute abnormality.  Detailed results listed below. -NH4 level is ammonia. - CT chest and transvaginal ultrasound showed no abnormalities.  Pending:   - serum NMDA, [facility] paraneoplastic panel - MRI of the C-spine given right hemiparesis  Hx of Convulsions/Seizures  -Pt has a PMH of seizures and catatonia.   -MRI of the brain showed thalamic signal abnormality during previous admission.  No longer present on current MRI. -Pt currently taking Keppra and Depakene.  Increased Depakene to 500mg  tid -Valproic acid lvl 35.1 was low.  Re-check levels in the AM  Hx of Anxiety  -Continue pt on Remeron -Patient appears to have had a psychiatric history   -Current illness is bringing out psychiatric symptoms. -Psychiatry consulted as psychotropic medications may help with her current symptoms. -Psych prescribed Risperdal .5 mg bid.  Hypertension  -Pt has a PMH of HTN and most recent BP reading was 151/108 -Likely secondary to anxiety and high dose steroids. -Continue atenolol and hydralazine PRN  Malnutrition with history of Gastric Bypass -Per SNF patient has had no PO intake for over 1 week prior to admission. -SNF sent her to hospital for J tube placement. -On my exam patient appears to drink if it is held to her mouth. -Nutrition consulted. -Speech consulted. -Will check pre-albumin to determine if patient may need TNA. -Reassured SNF that patient will not be sent back unless she has some method of taking nutrition.  Folate deficiency -Replete parenterally.  Recent hx of c-diff (05/18/13)  -Pt negative for C-diff at this admit   DVT Prophylaxis:  IV Heparin  Code Status: Full Code Family Communication: Son was contacted to explain procedures and status Disposition Plan: D/C to SNF when medically appropriate   Consultants:  Psych  Neuro  Procedures:  Plasmapheresis  PICC Line Placement  Antibiotics:  None  Objective: Filed Vitals:   06/22/13 0936 06/22/13 0945 06/22/13 1001 06/22/13 1012  BP: 135/92 152/73 135/101 148/89  Pulse: 112 104 116 115  Temp: 98.2 F (36.8 C) 97.8 F (36.6 C) 97.5 F (36.4 C) 97.5 F (36.4 C)  TempSrc: Oral Oral Oral Oral  Resp: 17 13 14 26   Height:      Weight:      SpO2:    98%   No intake or output data in the 24 hours  ending 06/22/13 1244 Filed Weights   06/20/13 0626 06/22/13 0454 06/22/13 0900  Weight: 69 kg (152 lb 1.9 oz) 71.8 kg (158 lb 4.6 oz) 69.9 kg (154 lb 1.6 oz)    Exam: General: awake and alert, frightful, appears mild distress, rambling HEENT: PERRLA, EOMI, Anicteic Sclera,  Neck: Supple, no JVD, no masses, no lymphadenopathy  Cardiovascular:   tachycardic rhythm, S1 S2 auscultated, no rubs, murmurs or gallops.  Respiratory: Clear to auscultation bilaterally with equal chest rise  Abdomen: Soft, nontender, nondistended, + bowel sounds  Extremities: No swelling, or cyanosis. Pain in the legs elicited  Neuro: Diminished strength equally in all 4 ext.  Skin: No scars or scratches observed  Psych: Abnormal affect, pt appears frightened, rambling, but will follow commands.     Data Reviewed: Basic Metabolic Panel:  Recent Labs Lab 06/18/13 1143 06/19/13 1925 06/20/13 0515 06/20/13 1940 06/22/13 0500 06/22/13 0916  NA 141 142 141 140 143 145  K 4.0 3.6* 4.2 3.9 4.0 3.9  CL 104 109 106 105 109 107  CO2 20 21 20   --  21  --   GLUCOSE 82 94 112* 142* 115* 110*  BUN 8 6 6  5* 10 10  CREATININE 0.88 0.64 0.69 0.80 0.66 0.80  CALCIUM 8.8 8.3* 8.6  --  8.8  --    Liver Function Tests:  Recent Labs Lab 06/18/13 1143 06/19/13 1925 06/20/13 0515  AST 30 23 25   ALT 11 9 9   ALKPHOS 43 35* 38*  BILITOT 0.4 0.4 0.4  PROT 6.7 5.9* 6.3  ALBUMIN 3.1* 2.6* 2.7*    Recent Labs Lab 06/19/13 1925  AMMONIA 24   CBC:  Recent Labs Lab 06/18/13 1143 06/19/13 1925 06/20/13 0515 06/20/13 1940 06/22/13 0500 06/22/13 0916  WBC 7.0 5.0 2.3*  --  6.1  --   NEUTROABS  --  2.8  --   --   --   --   HGB 12.9 11.2* 12.0 12.6 10.6* 10.9*  HCT 38.7 34.8* 36.3 37.0 31.9* 32.0*  MCV 95.3 96.9 94.8  --  93.8  --   PLT 129* 113* 129*  --  101*  --    CBG:  Recent Labs Lab 06/20/13 0834 06/21/13 0800 06/22/13 0745  GLUCAP 111* 109* 118*    Recent Results (from the past 240 hour(s))  URINE CULTURE     Status: None   Collection Time    06/13/13  7:13 PM      Result Value Range Status   Specimen Description URINE, CATHETERIZED   Final   Special Requests NONE   Final   Culture  Setup Time     Final   Value: 06/14/2013 02:33     Performed at [facility] Count     Final   Value: NO GROWTH     Performed at  [facility]   Culture     Final   Value: NO GROWTH     Performed at [facility]   Report Status 06/14/2013 FINAL   Final  CLOSTRIDIUM DIFFICILE BY PCR     Status: None   Collection Time    06/19/13  5:14 PM      Result Value Range Status   C difficile by pcr NEGATIVE  NEGATIVE Final  MRSA PCR SCREENING     Status: None   Collection Time    06/19/13  5:14 PM      Result Value Range Status  MRSA by PCR NEGATIVE  NEGATIVE Final   Comment:            The GeneXpert MRSA Assay (FDA     approved for NASAL specimens     only), is one component of a     comprehensive MRSA colonization     surveillance program. It is not     intended to diagnose MRSA     infection nor to guide or     monitor treatment for     MRSA infections.  STOOL CULTURE     Status: None   Collection Time    06/19/13  5:14 PM      Result Value Range Status   Specimen Description STOOL   Final   Special Requests NONE   Final   Culture     Final   Value: NO SUSPICIOUS COLONIES, CONTINUING TO HOLD     Performed at [facility]   Report Status PENDING   Incomplete     Studies: Ct Chest W Contrast  06/21/2013   CLINICAL DATA:  Altered mental status.  Evaluate for malignancy.  EXAM: CT CHEST WITH CONTRAST  TECHNIQUE: Multidetector CT imaging of the chest was performed during intravenous contrast administration.  CONTRAST:  75mL OMNIPAQUE IOHEXOL 300 MG/ML  SOLN  COMPARISON:  Chest x-ray dated 05/11/2013  FINDINGS: Heart size and pulmonary vascularity are normal. There is no hilar or mediastinal adenopathy. The lungs are clear. No mass lesions, infiltrates, or effusions. Soft tissues of the chest are normal. Thyroid gland is normal. No acute osseous abnormality. Osteophytes fuse the thoracic spine from T6 through T10.  IMPRESSION: No evidence of malignancy or other significant abnormality of the chest.   Electronically Signed   By: Geanie Cooley M.D.   On: 06/21/2013 17:13   Mr Laqueta Jean RU  Contrast  06/20/2013   CLINICAL DATA:  Altered mental status. Possible autoimmune encephalitis.  EXAM: MRI HEAD WITHOUT AND WITH CONTRAST  TECHNIQUE: Multiplanar, multiecho pulse sequences of the brain and surrounding structures were obtained without and with intravenous contrast.  CONTRAST:  15mL MULTIHANCE GADOBENATE DIMEGLUMINE 529 MG/ML IV SOLN  COMPARISON:  Head CT 06/13/2013 and brain MRI 05/16/2013  FINDINGS: Images are mildly to moderately degraded by motion artifact.  Incidental note is again made of a partially empty sella. No definite residual abnormal T2 or diffusion weighted signal is identified in the thalami. Mild periventricular T2 hyperintensity is unchanged and nonspecific. No new areas of brain parenchymal signal abnormality are identified. There is mild cerebral atrophy. There is no evidence of acute infarct. There is no evidence of mass, midline shift, or extra-axial fluid collection. Orbits are unremarkable. Major intracranial vascular flow voids are unremarkable. Visualized paranasal sinuses and mastoid air cells are clear. There is no abnormal enhancement.  IMPRESSION: Interval resolution of thalamic signal abnormality. No evidence of new intracranial abnormality.   Electronically Signed   By: Sebastian Ache   On: 06/20/2013 17:26   US Transvaginal Non-ob  06/21/2013   CLINICAL DATA:  Altered mental status. Evaluation for possible paraneoplastic syndrome.  EXAM: TRANSABDOMINAL AND TRANSVAGINAL ULTRASOUND OF PELVIS  TECHNIQUE: Both transabdominal and transvaginal ultrasound examinations of the pelvis were performed. Transabdominal technique was performed for global imaging of the pelvis including uterus, ovaries, adnexal regions, and pelvic cul-de-sac. It was necessary to proceed with endovaginal exam following the transabdominal exam to visualize the ovaries.  COMPARISON:  None  FINDINGS: Uterus  Removed.  Right ovary  Not visualized.  Left  ovary  Not visualized.  Other findings  No free  fluid.  IMPRESSION: The ovaries could not be visualized on transvaginal or transabdominal pelvic ultrasound. However, review of the CT scan of the pelvis dated 05/17/2013 does demonstrate that both ovaries appear normal, measuring 23 mm on the right and 16 mm on the left.   Electronically Signed   By: Geanie Cooley M.D.   On: 06/21/2013 16:45   US Pelvis Complete  06/21/2013   CLINICAL DATA:  Altered mental status. Evaluation for possible paraneoplastic syndrome.  EXAM: TRANSABDOMINAL AND TRANSVAGINAL ULTRASOUND OF PELVIS  TECHNIQUE: Both transabdominal and transvaginal ultrasound examinations of the pelvis were performed. Transabdominal technique was performed for global imaging of the pelvis including uterus, ovaries, adnexal regions, and pelvic cul-de-sac. It was necessary to proceed with endovaginal exam following the transabdominal exam to visualize the ovaries.  COMPARISON:  None  FINDINGS: Uterus  Removed.  Right ovary  Not visualized.  Left ovary  Not visualized.  Other findings  No free fluid.  IMPRESSION: The ovaries could not be visualized on transvaginal or transabdominal pelvic ultrasound. However, review of the CT scan of the pelvis dated 05/17/2013 does demonstrate that both ovaries appear normal, measuring 23 mm on the right and 16 mm on the left.   Electronically Signed   By: Geanie Cooley M.D.   On: 06/21/2013 16:45   Ir Fluoro Guide Cv Line Right  06/20/2013   CLINICAL DATA:  Encephalitis and need for tunneled catheter for plasmapheresis.  EXAM: TUNNELED CENTRAL VENOUS CATHETER PLACEMENT WITH ULTRASOUND AND FLUOROSCOPIC GUIDANCE  MEDICATIONS: 1 g IV vancomycin. Vancomycin was given within two hours of incision. Vancomycin was given due to an antibiotic allergy.  ANESTHESIA/SEDATION: 2.0 mg IV Versed; 100 mcg IV Fentanyl.  Total Moderate Sedation Time  Twenty minutes.  FLUOROSCOPY TIME:  42 seconds.  PROCEDURE: The procedure, risks, benefits, and alternatives were explained to the patient.  Questions regarding the procedure were encouraged and answered. The patient understands and consents to the procedure.  The right neck and chest were prepped with chlorhexidine in a sterile fashion, and a sterile drape was applied covering the operative field. Maximum barrier sterile technique with sterile gowns and gloves were used for the procedure. Local anesthesia was provided with 1% lidocaine.  Ultrasound was used to confirm patency of the right internal jugular vein. After creating a small venotomy incision, a 21 gauge needle was advanced into the right internal jugular vein under direct, real-time ultrasound guidance. Ultrasound image documentation was performed. After securing guidewire access, an 8 Fr dilator was placed. A J-wire was kinked to measure appropriate catheter length.  A Bard Equistream tunneled hemodialysis/pheresis catheter measuring 19 cm from tip to cuff was chosen for placement. This was tunneled in a retrograde fashion from the chest wall to the venotomy incision.  At the venotomy, serial dilatation was performed and a 16 Fr peel-away sheath was placed over a guidewire. The catheter was then placed through the sheath and the sheath removed. Final catheter positioning was confirmed and documented with a fluoroscopic spot image. The catheter was aspirated, flushed with saline, and injected with appropriate volume heparin dwells.  The venotomy incision was closed with subcutaneous 3-0 Monocryl and subcuticular 4-0 Vicryl. Dermabond was applied to the incision. The catheter exit site was secured with 0-Prolene retention sutures.  COMPLICATIONS: None.  No pneumothorax.  FINDINGS: After catheter placement, the tips lie in the right atrium. The catheter aspirates normally and is ready for immediate use.  IMPRESSION: Placement of tunneled pheresis catheter via the right internal jugular vein. The catheter tips lie in the right atrium. The catheter is ready for immediate use.   Electronically  Signed   By: Irish LackGlenn  Yamagata M.D.   On: 06/20/2013 14:27   Ir Koreas Guide Vasc Access Right  06/20/2013   CLINICAL DATA:  Encephalitis and need for tunneled catheter for plasmapheresis.  EXAM: TUNNELED CENTRAL VENOUS CATHETER PLACEMENT WITH ULTRASOUND AND FLUOROSCOPIC GUIDANCE  MEDICATIONS: 1 g IV vancomycin. Vancomycin was given within two hours of incision. Vancomycin was given due to an antibiotic allergy.  ANESTHESIA/SEDATION: 2.0 mg IV Versed; 100 mcg IV Fentanyl.  Total Moderate Sedation Time  Twenty minutes.  FLUOROSCOPY TIME:  42 seconds.  PROCEDURE: The procedure, risks, benefits, and alternatives were explained to the patient. Questions regarding the procedure were encouraged and answered. The patient understands and consents to the procedure.  The right neck and chest were prepped with chlorhexidine in a sterile fashion, and a sterile drape was applied covering the operative field. Maximum barrier sterile technique with sterile gowns and gloves were used for the procedure. Local anesthesia was provided with 1% lidocaine.  Ultrasound was used to confirm patency of the right internal jugular vein. After creating a small venotomy incision, a 21 gauge needle was advanced into the right internal jugular vein under direct, real-time ultrasound guidance. Ultrasound image documentation was performed. After securing guidewire access, an 8 Fr dilator was placed. A J-wire was kinked to measure appropriate catheter length.  A Bard Equistream tunneled hemodialysis/pheresis catheter measuring 19 cm from tip to cuff was chosen for placement. This was tunneled in a retrograde fashion from the chest wall to the venotomy incision.  At the venotomy, serial dilatation was performed and a 16 Fr peel-away sheath was placed over a guidewire. The catheter was then placed through the sheath and the sheath removed. Final catheter positioning was confirmed and documented with a fluoroscopic spot image. The catheter was aspirated,  flushed with saline, and injected with appropriate volume heparin dwells.  The venotomy incision was closed with subcutaneous 3-0 Monocryl and subcuticular 4-0 Vicryl. Dermabond was applied to the incision. The catheter exit site was secured with 0-Prolene retention sutures.  COMPLICATIONS: None.  No pneumothorax.  FINDINGS: After catheter placement, the tips lie in the right atrium. The catheter aspirates normally and is ready for immediate use.  IMPRESSION: Placement of tunneled pheresis catheter via the right internal jugular vein. The catheter tips lie in the right atrium. The catheter is ready for immediate use.   Electronically Signed   By: Irish LackGlenn  Yamagata M.D.   On: 06/20/2013 14:27    Scheduled Meds: . atenolol  12.5 mg Oral Daily  . calcium gluconate IVPB  4 g Intravenous Once  . feeding supplement (RESOURCE BREEZE)  1 Container Oral TID BM  . heparin  5,000 Units Subcutaneous Q8H  . levETIRAcetam  1,000 mg Oral BID  . methylPREDNISolone (SOLU-MEDROL) injection  500 mg Intravenous Q12H  . mirtazapine  30 mg Oral QHS  . risperiDONE  0.5 mg Oral BID  . sodium chloride  3 mL Intravenous Q12H  . thiamine  100 mg Oral Daily  . valproic acid  500 mg Oral TID   Continuous Infusions:   Principal Problem:   Acute encephalopathy Active Problems:   Convulsions/seizures   Toxic metabolic encephalopathy   Psychosis    Horris LatinoLance McGhee PA-S  Algis DownsMarianne York, PA-C [facility] Pager [phone]. If 7PM-7AM, please contact night-coverage at [email or web address],  password [facility] 06/22/2013, 12:44 PM  LOS: 3 days

## 2013-06-22 NOTE — Clinical Social Work Note (Signed)
[facility] or MD. PA notified.  Roddie McBryant Almarie Kurdziel, [place]CSWA, [place], [phone]

## 2013-06-22 NOTE — Progress Notes (Signed)
Subjective: PAtient seems more talkative today, is able to remember some recent events, but continues to be tangential and non-sensical at times.   Exam: Filed Vitals:   06/22/13 1313  BP: 154/105  Pulse: 128  Temp: 98.5 F (36.9 C)  Resp: 20   Gen: In bed, NAD MS: Awake, alert, oriented to year, but not place.  WU:JWJXBCN:PERRL, EOMI, VFF Motor: Unclear whether the right hemipareiss is due to pain from PICC line or is true hemiparesis.  Sensory:intact to LT  No sigsn of malignancy on CT chest or TV ultrasound.   Impression: 53 year old female with altered mental status that is persistent. I continue to think she was in status epilepticus for multiple days at presentation of the previous hospitalization. Given her continued hallucinations, and other concerning factors, autoimmune encephalitis is certainly a consideration. Her CSF panel was negative for this, however she will need serum panel sent as well. This will take some time to come back, and I would favor treating empirically in the interim with steroids and plasmapheresis.   Recommendations: 1) continue steroids  2) PLEX tomorrow.  3) MRI c-spine.   Ritta SlotMcNeill Kirkpatrick, MD [facility] [phone]  If 7pm- 7am, please page neurology on call at [phone].

## 2013-06-23 LAB — STOOL CULTURE

## 2013-06-23 LAB — BASIC METABOLIC PANEL
BUN: 12 mg/dL (ref 6–23)
CO2: 19 mEq/L (ref 19–32)
Calcium: 8.8 mg/dL (ref 8.4–10.5)
Chloride: 114 mEq/L — ABNORMAL HIGH (ref 96–112)
Creatinine, Ser: 0.6 mg/dL (ref 0.50–1.10)
GFR calc Af Amer: >90 mL/min (ref >90)
Glucose, Bld: 121 mg/dL — ABNORMAL HIGH (ref 70–99)
POTASSIUM: 3.9 meq/L (ref 3.7–5.3)
Sodium: 145 mEq/L (ref 137–147)

## 2013-06-23 LAB — URINALYSIS, ROUTINE W REFLEX MICROSCOPIC
BILIRUBIN URINE: NEGATIVE
GLUCOSE, UA: NEGATIVE mg/dL
HGB URINE DIPSTICK: NEGATIVE
Ketones, ur: NEGATIVE mg/dL
Leukocytes, UA: NEGATIVE
Nitrite: NEGATIVE
PH: 5.5 (ref 5.0–8.0)
Protein, ur: NEGATIVE mg/dL
SPECIFIC GRAVITY, URINE: 1.035 — AB (ref 1.005–1.030)
Urobilinogen, UA: 1 mg/dL (ref 0.0–1.0)

## 2013-06-23 LAB — GLUCOSE, CAPILLARY: Glucose-Capillary: 124 mg/dL — ABNORMAL HIGH (ref 70–99)

## 2013-06-23 LAB — OCCULT BLOOD X 1 CARD TO LAB, STOOL: FECAL OCCULT BLD: NEGATIVE

## 2013-06-23 LAB — CLOSTRIDIUM DIFFICILE BY PCR: Toxigenic C. Difficile by PCR: NEGATIVE

## 2013-06-23 LAB — VITAMIN B12: VITAMIN B 12: 331 pg/mL (ref 211–911)

## 2013-06-23 LAB — FOLATE: Folate: >20 ng/mL

## 2013-06-23 MED ORDER — SODIUM CHLORIDE 0.9 % IV SOLN
4.0000 g | Freq: Once | INTRAVENOUS | Status: DC
  Filled 2013-06-23: qty 40

## 2013-06-23 MED ORDER — ACD FORMULA A 0.73-2.45-2.2 GM/100ML VI SOLN: 500.0000 mL | Status: DC

## 2013-06-23 MED ORDER — HEPARIN SODIUM (PORCINE) 1000 UNIT/ML IJ SOLN: 1000.0000 [IU] | Freq: Once | INTRAMUSCULAR | Status: DC

## 2013-06-23 MED ORDER — DIPHENHYDRAMINE HCL 25 MG PO CAPS: 25.0000 mg | ORAL_CAPSULE | Freq: Four times a day (QID) | ORAL | Status: DC | PRN

## 2013-06-23 MED ORDER — ADULT MULTIVITAMIN W/MINERALS CH
1.0000 | ORAL_TABLET | Freq: Every day | ORAL | Status: DC
  Administered 2013-06-23 – 2013-07-01 (×4): 1 via ORAL
  Filled 2013-06-23 (×11): qty 1

## 2013-06-23 MED ORDER — SODIUM CHLORIDE 0.9 % IV SOLN: INTRAVENOUS | Status: DC

## 2013-06-23 MED ORDER — METOPROLOL TARTRATE 25 MG PO TABS
25.0000 mg | ORAL_TABLET | Freq: Two times a day (BID) | ORAL | Status: DC
  Administered 2013-06-23 – 2013-06-24 (×3): 25 mg via ORAL
  Filled 2013-06-23 (×4): qty 1

## 2013-06-23 MED ORDER — ACETAMINOPHEN 325 MG PO TABS: 650.0000 mg | ORAL_TABLET | ORAL | Status: DC | PRN

## 2013-06-23 MED ORDER — PIPERACILLIN-TAZOBACTAM 3.375 G IVPB
3.3750 g | Freq: Three times a day (TID) | INTRAVENOUS | Status: DC
  Administered 2013-06-23 – 2013-06-26 (×9): 3.375 g via INTRAVENOUS
  Filled 2013-06-23 (×13): qty 50

## 2013-06-23 MED ORDER — SODIUM CHLORIDE 0.9 % IV BOLUS (SEPSIS)
1000.0000 mL | Freq: Once | INTRAVENOUS | Status: AC
  Administered 2013-06-23: 1000 mL via INTRAVENOUS

## 2013-06-23 MED ORDER — CALCIUM GLUCONATE 10 % IV SOLN: 4.0000 g | Freq: Once | INTRAVENOUS | Status: DC

## 2013-06-23 MED ORDER — CALCIUM CARBONATE ANTACID 500 MG PO CHEW
2.0000 | CHEWABLE_TABLET | ORAL | Status: DC
  Filled 2013-06-23 (×2): qty 2

## 2013-06-23 MED ORDER — CALCIUM CARBONATE ANTACID 500 MG PO CHEW: 2.0000 | CHEWABLE_TABLET | ORAL | Status: DC

## 2013-06-23 MED ORDER — ACD FORMULA A 0.73-2.45-2.2 GM/100ML VI SOLN
500.0000 mL | Status: DC
Start: 2013-06-23 — End: 2013-06-23
  Filled 2013-06-23: qty 500

## 2013-06-23 MED ORDER — SODIUM CHLORIDE 0.9 % IV SOLN
INTRAVENOUS | Status: DC
  Filled 2013-06-23 (×3): qty 200

## 2013-06-23 MED ORDER — SODIUM CHLORIDE 0.9 % IV SOLN
INTRAVENOUS | Status: DC
  Administered 2013-06-23: 15:00:00 via INTRAVENOUS

## 2013-06-23 NOTE — Progress Notes (Signed)
[facility]  [facility]

## 2013-06-23 NOTE — Progress Notes (Signed)
Patient having frequent large amonut of stool MD aware Plan; OB/surgical consult, Patient Transfer to SDU.

## 2013-06-23 NOTE — Progress Notes (Signed)
Subjective: Slightly sleepier, continues to fixate on specific medal ideas(e.g. Pulse, bleeding from picc line) Exam: Filed Vitals:   06/23/13 1253  BP: 122/85  Pulse: 138  Temp: 97.8 F (36.6 C)  Resp: 20   Gen: In bed, NAD MS: slightly sleepy this morning.  oriented to year,  ZO:XWRUECN:PERRL, EOMI, VFF Motor: Unclear whether the right hemipareiss is due to pain from PICC line or is true hemiparesis.  Sensory:intact to LT  No sigsn of malignancy on CT chest or TV ultrasound.   Impression: 53 year old female with altered mental status that is persistent. I continue to think she was in status epilepticus for multiple days at presentation of the previous hospitalization. Given her continued hallucinations, and other concerning factors, autoimmune encephalitis is certainly a consideration. Her CSF panel was negative for this, however she will need serum panel sent as well. This will take some time to come back, and I would favor treating empirically in the interim with steroids and plasmapheresis.   Recommendations: 1) will d/c steroids and continue PLEX.  2) PLEX QOD, treatment 2/5 yesterday, 3/5 tomorrow.  3) MRI c-spine was normal.   Ritta SlotMcNeill Zury Fazzino, MD [facility] [phone]  If 7pm- 7am, please page neurology on call at [phone].

## 2013-06-23 NOTE — Progress Notes (Signed)
Patient is having loose stools and it appears to be coming out of her vagina.

## 2013-06-23 NOTE — Progress Notes (Signed)
ANTIBIOTIC CONSULT NOTE - INITIAL  Pharmacy Consult for Zosyn Indication: empiric ABX  Allergies  Allergen Reactions  . Morphine And Related Other (See Comments)    REACTION: Causes pain  . Paroxetine Hcl Other (See Comments)    REACTION:  unknown  . Penicillins Other (See Comments)    REACTION: Makes her body hurt.  . Sertraline Hcl Other (See Comments)    REACTION:  unknown    Patient Measurements: Height: 5\' 3"  (160 cm) Weight: 154 lb 1.6 oz (69.9 kg) IBW/kg (Calculated) : 52.4   Vital Signs: Temp: 97.8 F (36.6 C) (01/25 1253) Temp src: Oral (01/25 1253) BP: 122/85 mmHg (01/25 1253) Pulse Rate: 138 (01/25 1253) Intake/Output from previous day:   Intake/Output from this shift:    Labs:  Recent Labs  06/22/13 0500 06/22/13 0916 06/23/13 0455  WBC 6.1  --  4.0  HGB 10.6* 10.9* 10.2*  PLT 101*  --  72*  CREATININE 0.66 0.80 0.60   Estimated Creatinine Clearance: 78 ml/min (by C-G formula based on Cr of 0.6). No results found for this basename: VANCOTROUGH, Leodis Binet, VANCORANDOM, GENTTROUGH, GENTPEAK, GENTRANDOM, TOBRATROUGH, TOBRAPEAK, TOBRARND, AMIKACINPEAK, AMIKACINTROU, AMIKACIN,  in the last 72 hours   Microbiology: Recent Results (from the past 720 hour(s))  CLOSTRIDIUM DIFFICILE BY PCR     Status: None   Collection Time    05/27/13  1:43 PM      Result Value Range Status   C difficile by pcr NEGATIVE  NEGATIVE Final  URINE CULTURE     Status: None   Collection Time    06/13/13  7:13 PM      Result Value Range Status   Specimen Description URINE, CATHETERIZED   Final   Special Requests NONE   Final   Culture  Setup Time     Final   Value: 06/14/2013 02:33     Performed at [facility] Count     Final   Value: NO GROWTH     Performed at [facility]   Culture     Final   Value: NO GROWTH     Performed at [facility]   Report Status 06/14/2013 FINAL   Final  CLOSTRIDIUM DIFFICILE BY PCR     Status: None   Collection Time    06/19/13  5:14 PM      Result Value Range Status   C difficile by pcr NEGATIVE  NEGATIVE Final  MRSA PCR SCREENING     Status: None   Collection Time    06/19/13  5:14 PM      Result Value Range Status   MRSA by PCR NEGATIVE  NEGATIVE Final   Comment:            The GeneXpert MRSA Assay (FDA     approved for NASAL specimens     only), is one component of a     comprehensive MRSA colonization     surveillance program. It is not     intended to diagnose MRSA     infection nor to guide or     monitor treatment for     MRSA infections.  STOOL CULTURE     Status: None   Collection Time    06/19/13  5:14 PM      Result Value Range Status   Specimen Description STOOL   Final   Special Requests NONE   Final   Culture     Final  Value: NO SALMONELLA, SHIGELLA, CAMPYLOBACTER, YERSINIA, OR E.COLI [id number] ISOLATED     [facility]   Report Status 06/23/2013 FINAL   Final    Medical History: Past Medical History  Diagnosis Date  . Peripheral neuropathy   . Fibromyalgia   . Hypertension   . Anxiety   . Catatonia   . Seizures      Assessment: 52yof admitted with altered mental status and hx Sz activity.  Afebrile, WBC wnl CrCl > 430mil/min.  Ongoing hallucinations and MS changes.  Begin Empiric ABX.    Goal of Therapy:  erradication of infection  Plan:  Zosyn 3.375GM IV q8 EI  Leota SauersLisa Rilynn Habel Pharm.D. CPP, BCPS Clinical Pharmacist [phone] 06/23/2013 1:50 PM

## 2013-06-23 NOTE — Progress Notes (Signed)
[facility] PROGRESS NOTE  Ruey Storer ZOX:[id number] DOB: 26-May-1961 DOA: 06/19/2013 PCP: [name], MD  HPI/Subjective: 53 yr old female presents with a CC of Altered mental status. Pt has a PMH of peripheral neuropathy, seizures, catatonia, anxiety, HTN, and fibromyalgia. Pt was previously admitted on 12/13 thru 12/28 with toxic metabolic encephalopathy.  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  EEG showed no recurrent seizures. A 24 hour EEG  showed an intermittent delta pattern felt to represent Frontal Intermittent Rhythmic Delta Activity. Pt was started on two antiepileptics.  This admission, she was again found altered and seemed "comatose" . Since d/c in December, pt has had persistent confusion and is confined to a wheelchair at [facility]. She is currently confused and experiencing visual hallucinations.  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 -Pt received diatech catheter placement and plasmapheresis on 06/20/13.   -Plan is for plasmapheresis every other day and Solumedrol 500 mg bid x 3 days. D/C on 1/24. -MRI of the brain showed no acute abnormality.  Detailed results listed below. -Ammonia level is normal - CT chest and transvaginal ultrasound showed no abnormalities. -[facility] paraneoplastic panel reported to be negative, MRI of the C-spine is negative for acute findings  Pending:   - serum NMDA.   Tachycardia -Check 12-lead EKG, patient is on atenolol 12.5 not sure this is therapeutic dose. -Switch atenolol to metoprolol 25 mg twice a day.  Hx of Convulsions/Seizures  -Pt has a PMH of seizures and catatonia.   -MRI of the brain showed thalamic signal abnormality during previous admission.  No longer present on current MRI. -Pt currently taking Keppra and Depakene.  Increased Depakene to 500mg   tid -Valproic acid lvl 35.1 was low.  Re-check levels in the AM  Hx of Anxiety  -Continue pt on Remeron -Patient appears to have had a psychiatric history  -Current illness is bringing out psychiatric symptoms. -Psychiatry consulted as psychotropic medications may help with her current symptoms. -Psych prescribed Risperdal .5 mg bid.  Hypertension  -Pt has a PMH of HTN and most recent BP reading was 151/108 -Likely secondary to anxiety and high dose steroids. -Continue atenolol and hydralazine PRN  Malnutrition with history of Gastric Bypass -Per SNF patient has had no PO intake for over 1 week prior to admission. -SNF sent her to hospital for J tube placement. -On my exam patient appears to drink if it is held to her mouth. -Nutrition consulted. -Speech consulted. -Will check pre-albumin to determine if patient may need TNA. -Reassured SNF that patient will not be sent back unless she has some method of taking nutrition.  Folate deficiency -Replete parenterally.  Recent hx of c-diff (05/18/13)  -Pt negative for C-diff at this admit  Thrombocytopenia -Discontinue heparin, seems to be chronic up and down, could be secondary to folate deficiency.  DVT Prophylaxis:  IV Heparin  Code Status: Full Code Family Communication: Son was contacted to explain procedures and status Disposition Plan: D/C to SNF when medically appropriate   Consultants:  Psych  Neuro  Procedures:  Plasmapheresis  PICC Line Placement  Antibiotics:  None  Objective: Filed Vitals:   06/22/13 1012 06/22/13 1313 06/22/13 2053 06/23/13 0538  BP: 148/89 154/105 140/95 158/106  Pulse: 115 128 108   Temp: 97.5 F (36.4 C) 98.5 F (36.9 C) 97.8 F (36.6 C) 98.8 F (37.1 C)  TempSrc: Oral Oral Oral Oral  Resp: 26 20 20 20   Height:      Weight:      SpO2: 98% 100% 100% 100%   No intake or output data in the 24 hours ending 06/23/13 1052 Filed Weights   06/20/13 0626 06/22/13 0454  06/22/13 0900  Weight: 69 kg (152 lb 1.9 oz) 71.8 kg (158 lb 4.6 oz) 69.9 kg (154 lb 1.6 oz)    Exam: General: awake and alert, frightful, appears mild distress, rambling HEENT: PERRLA, EOMI, Anicteic Sclera,  Neck: Supple, no JVD, no masses, no lymphadenopathy  Cardiovascular:  tachycardic rhythm, S1 S2 auscultated, no rubs, murmurs or gallops.  Respiratory: Clear to auscultation bilaterally with equal chest rise  Abdomen: Soft, nontender, nondistended, + bowel sounds  Extremities: No swelling, or cyanosis. Pain in the legs elicited  Neuro: Diminished strength equally in all 4 ext.  Skin: No scars or scratches observed  Psych: Abnormal affect, pt appears frightened, rambling, but will follow commands.     Data Reviewed: Basic Metabolic Panel:  Recent Labs Lab 06/18/13 1143 06/19/13 1925 06/20/13 0515 06/20/13 1940 06/22/13 0500 06/22/13 0916 06/23/13 0455  NA 141 142 141 140 143 145 145  K 4.0 3.6* 4.2 3.9 4.0 3.9 3.9  CL 104 109 106 105 109 107 114*  CO2 20 21 20   --  21  --  19  GLUCOSE 82 94 112* 142* 115* 110* 121*  BUN 8 6 6  5* 10 10 12   CREATININE 0.88 0.64 0.69 0.80 0.66 0.80 0.60  CALCIUM 8.8 8.3* 8.6  --  8.8  --  8.8   Liver Function Tests:  Recent Labs Lab 06/18/13 1143 06/19/13 1925 06/20/13 0515  AST 30 23 25   ALT 11 9 9   ALKPHOS 43 35* 38*  BILITOT 0.4 0.4 0.4  PROT 6.7 5.9* 6.3  ALBUMIN 3.1* 2.6* 2.7*    Recent Labs Lab 06/19/13 1925  AMMONIA 24   CBC:  Recent Labs Lab 06/18/13 1143 06/19/13 1925 06/20/13 0515 06/20/13 1940 06/22/13 0500 06/22/13 0916 06/23/13 0455  WBC 7.0 5.0 2.3*  --  6.1  --  4.0  NEUTROABS  --  2.8  --   --   --   --   --   HGB 12.9 11.2* 12.0 12.6 10.6* 10.9* 10.2*  HCT 38.7 34.8* 36.3 37.0 31.9* 32.0* 30.6*  MCV 95.3 96.9 94.8  --  93.8  --  94.2  PLT 129* 113* 129*  --  101*  --  72*   CBG:  Recent Labs Lab 06/20/13 0834 06/21/13 0800 06/22/13 0745 06/23/13 0754  GLUCAP 111* 109* 118* 124*     Recent Results (from the past 240 hour(s))  URINE CULTURE     Status: None   Collection Time    06/13/13  7:13 PM      Result Value Range Status   Specimen Description URINE, CATHETERIZED   Final   Special Requests NONE   Final   Culture  Setup Time     Final   Value: 06/14/2013 02:33     Performed at [facility] Count     Final   Value: NO GROWTH     Performed at [facility]   Culture     Final   Value: NO GROWTH     Performed at [facility]   Report Status 06/14/2013 FINAL   Final  CLOSTRIDIUM DIFFICILE BY PCR     Status:  None   Collection Time    06/19/13  5:14 PM      Result Value Range Status   C difficile by pcr NEGATIVE  NEGATIVE Final  MRSA PCR SCREENING     Status: None   Collection Time    06/19/13  5:14 PM      Result Value Range Status   MRSA by PCR NEGATIVE  NEGATIVE Final   Comment:            The GeneXpert MRSA Assay (FDA     approved for NASAL specimens     only), is one component of a     comprehensive MRSA colonization     surveillance program. It is not     intended to diagnose MRSA     infection nor to guide or     monitor treatment for     MRSA infections.  STOOL CULTURE     Status: None   Collection Time    06/19/13  5:14 PM      Result Value Range Status   Specimen Description STOOL   Final   Special Requests NONE   Final   Culture     Final   Value: NO SUSPICIOUS COLONIES, CONTINUING TO HOLD     Performed at [facility]   Report Status PENDING   Incomplete     Studies: Ct Chest W Contrast  06/21/2013   CLINICAL DATA:  Altered mental status.  Evaluate for malignancy.  EXAM: CT CHEST WITH CONTRAST  TECHNIQUE: Multidetector CT imaging of the chest was performed during intravenous contrast administration.  CONTRAST:  75mL OMNIPAQUE IOHEXOL 300 MG/ML  SOLN  COMPARISON:  Chest x-ray dated 05/11/2013  FINDINGS: Heart size and pulmonary vascularity are normal. There is no hilar or mediastinal  adenopathy. The lungs are clear. No mass lesions, infiltrates, or effusions. Soft tissues of the chest are normal. Thyroid gland is normal. No acute osseous abnormality. Osteophytes fuse the thoracic spine from T6 through T10.  IMPRESSION: No evidence of malignancy or other significant abnormality of the chest.   Electronically Signed   By: Geanie Cooley M.D.   On: 06/21/2013 17:13   Mr Cervical Spine Wo Contrast  06/22/2013   CLINICAL DATA:  Right hemiparesis. Possible autoimmune encephalitis.  EXAM: MRI CERVICAL SPINE WITHOUT CONTRAST  TECHNIQUE: Multiplanar, multisequence MR imaging was performed. No intravenous contrast was administered.  COMPARISON:  No comparison cervical spine MR. brain MR 06/20/2013.  FINDINGS: On this motion grade examination, artifact extends through the cervical cord however, no definitive cervical cord signal abnormality is noted. Cervical medullary junction unremarkable. Intracranial structures as detailed on recent MR of the brain.  Visualized paravertebral structures unremarkable. Both vertebral arteries are patent.  C2-3:  Negative.  C3-4:  Minimal right foraminal narrowing.  C4-5:  Minimal right foraminal narrowing.  C5-6:  Negative.  C6-7:  Negative.  C7-T1:  Negative.  T1-2:  Negative.  T2-3: Minimal Schmorl's node deformity.  Minimal bulge.  T3-4: Minimal bulge.  T4-5: Minimal bulge.  IMPRESSION: No evidence of cervical disc herniation.  Minimal degenerative changes upper thoracic spine.  Please see above.   Electronically Signed   By: Bridgett Larsson M.D.   On: 06/22/2013 19:38   US Transvaginal Non-ob  06/21/2013   CLINICAL DATA:  Altered mental status. Evaluation for possible paraneoplastic syndrome.  EXAM: TRANSABDOMINAL AND TRANSVAGINAL ULTRASOUND OF PELVIS  TECHNIQUE: Both transabdominal and transvaginal ultrasound examinations of the pelvis were performed. Transabdominal technique was performed for  global imaging of the pelvis including uterus, ovaries, adnexal regions,  and pelvic cul-de-sac. It was necessary to proceed with endovaginal exam following the transabdominal exam to visualize the ovaries.  COMPARISON:  None  FINDINGS: Uterus  Removed.  Right ovary  Not visualized.  Left ovary  Not visualized.  Other findings  No free fluid.  IMPRESSION: The ovaries could not be visualized on transvaginal or transabdominal pelvic ultrasound. However, review of the CT scan of the pelvis dated 05/17/2013 does demonstrate that both ovaries appear normal, measuring 23 mm on the right and 16 mm on the left.   Electronically Signed   By: Geanie CooleyJim  Maxwell M.D.   On: 06/21/2013 16:45   [place] Pelvis Complete  06/21/2013   CLINICAL DATA:  Altered mental status. Evaluation for possible paraneoplastic syndrome.  EXAM: TRANSABDOMINAL AND TRANSVAGINAL ULTRASOUND OF PELVIS  TECHNIQUE: Both transabdominal and transvaginal ultrasound examinations of the pelvis were performed. Transabdominal technique was performed for global imaging of the pelvis including uterus, ovaries, adnexal regions, and pelvic cul-de-sac. It was necessary to proceed with endovaginal exam following the transabdominal exam to visualize the ovaries.  COMPARISON:  None  FINDINGS: Uterus  Removed.  Right ovary  Not visualized.  Left ovary  Not visualized.  Other findings  No free fluid.  IMPRESSION: The ovaries could not be visualized on transvaginal or transabdominal pelvic ultrasound. However, review of the CT scan of the pelvis dated 05/17/2013 does demonstrate that both ovaries appear normal, measuring 23 mm on the right and 16 mm on the left.   Electronically Signed   By: Geanie CooleyJim  Maxwell M.D.   On: 06/21/2013 16:45    Scheduled Meds: . feeding supplement (RESOURCE BREEZE)  1 Container Oral TID BM  . folic acid  1 mg Subcutaneous Daily  . levETIRAcetam  1,000 mg Oral BID  . methylPREDNISolone (SOLU-MEDROL) injection  500 mg Intravenous Q12H  . metoprolol tartrate  25 mg Oral BID  . mirtazapine  30 mg Oral QHS  . risperiDONE  0.5  mg Oral BID  . sodium chloride  3 mL Intravenous Q12H  . thiamine  100 mg Oral Daily  . valproic acid  500 mg Oral TID   Continuous Infusions:   Principal Problem:   Acute encephalopathy Active Problems:   Convulsions/seizures   Toxic metabolic encephalopathy   Psychosis    Horris LatinoLance McGhee PA-S  Algis DownsMarianne York, PA-C [facility] Pager [phone]. If 7PM-7AM, please contact night-coverage at [email or web address], password [facility] 06/23/2013, 10:52 AM  LOS: 4 days

## 2013-06-23 NOTE — Progress Notes (Signed)
Notified MD that patient HR in 130's.

## 2013-06-23 NOTE — Progress Notes (Signed)
  Notified by the RN that patient has loose stools coming out of her vagina. Radiographs reviewed with Dr. Chilton SiGreen of radiology. Patient had the same symptoms previously, she had CT of pelvis done with rectal contrast did not show fistula. Recent transvaginal ultrasound and CT of the pelvis reviewed without evidence of rectal or entero vaginal fistula. Patient examined by me in the presence of Mrs. Dagmar HaitShinita Daniels, RN as chaperone and presence of stool in the vagina confirmed. Per nursing staff patient needs continuous cleansing, requested transfer to step down for closer nursing monitoring.  Joan [name] Pager: [phone]: [phone] 06/23/2013, 6:07 PM

## 2013-06-24 DIAGNOSIS — G9340 Encephalopathy, unspecified: Secondary | ICD-10-CM

## 2013-06-24 DIAGNOSIS — E46 Unspecified protein-calorie malnutrition: Secondary | ICD-10-CM

## 2013-06-24 LAB — CBC
HCT: 30.6 % — ABNORMAL LOW (ref 36.0–46.0)
HEMATOCRIT: 25.9 % — AB (ref 36.0–46.0)
HEMOGLOBIN: 8.6 g/dL — AB (ref 12.0–15.0)
Hemoglobin: 10.2 g/dL — ABNORMAL LOW (ref 12.0–15.0)
MCH: 31.4 pg (ref 26.0–34.0)
MCH: 31.5 pg (ref 26.0–34.0)
MCHC: 33.3 g/dL (ref 30.0–36.0)
MCHC: 33.2 g/dL (ref 30.0–36.0)
MCV: 94.2 fL (ref 78.0–100.0)
MCV: 94.9 fL (ref 78.0–100.0)
Platelets: 72 10*3/uL — ABNORMAL LOW (ref 150–400)
Platelets: 59 10*3/uL — ABNORMAL LOW (ref 150–400)
RBC: 3.25 MIL/uL — ABNORMAL LOW (ref 3.87–5.11)
RBC: 2.73 MIL/uL — ABNORMAL LOW (ref 3.87–5.11)
RDW: 17.6 % — AB (ref 11.5–15.5)
RDW: 17.9 % — ABNORMAL HIGH (ref 11.5–15.5)
WBC: 4 10*3/uL (ref 4.0–10.5)
WBC: 4.2 10*3/uL (ref 4.0–10.5)

## 2013-06-24 LAB — GLUCOSE, CAPILLARY: Glucose-Capillary: 93 mg/dL (ref 70–99)

## 2013-06-24 LAB — BASIC METABOLIC PANEL
BUN: 15 mg/dL (ref 6–23)
CHLORIDE: 117 meq/L — AB (ref 96–112)
CO2: 19 meq/L (ref 19–32)
Calcium: 8.1 mg/dL — ABNORMAL LOW (ref 8.4–10.5)
Creatinine, Ser: 0.63 mg/dL (ref 0.50–1.10)
GFR calc Af Amer: >90 mL/min (ref >90)
GFR calc non Af Amer: >90 mL/min (ref >90)
GLUCOSE: 95 mg/dL (ref 70–99)
POTASSIUM: 2.8 meq/L — AB (ref 3.7–5.3)
SODIUM: 149 meq/L — AB (ref 137–147)

## 2013-06-24 LAB — URINE CULTURE
Colony Count: NO GROWTH
Culture: NO GROWTH

## 2013-06-24 LAB — MISCELLANEOUS TEST

## 2013-06-24 MED ORDER — DIVALPROEX SODIUM 125 MG PO CPSP
500.0000 mg | ORAL_CAPSULE | Freq: Three times a day (TID) | ORAL | Status: DC
  Administered 2013-06-24 – 2013-06-26 (×6): 500 mg via ORAL
  Filled 2013-06-24 (×12): qty 4

## 2013-06-24 MED ORDER — SODIUM CHLORIDE 0.9 % IV SOLN: INTRAVENOUS | Status: DC

## 2013-06-24 MED ORDER — CALCIUM CARBONATE ANTACID 500 MG PO CHEW
2.0000 | CHEWABLE_TABLET | ORAL | Status: AC
  Filled 2013-06-24 (×2): qty 2

## 2013-06-24 MED ORDER — HEPARIN SODIUM (PORCINE) 1000 UNIT/ML IJ SOLN
1000.0000 [IU] | Freq: Once | INTRAMUSCULAR | Status: AC
  Administered 2013-06-24: 1000 [IU]

## 2013-06-24 MED ORDER — METOPROLOL TARTRATE 1 MG/ML IV SOLN
10.0000 mg | Freq: Four times a day (QID) | INTRAVENOUS | Status: DC | PRN
  Administered 2013-06-24 – 2013-06-25 (×3): 10 mg via INTRAVENOUS
  Filled 2013-06-24 (×3): qty 10

## 2013-06-24 MED ORDER — METOPROLOL TARTRATE 50 MG PO TABS
50.0000 mg | ORAL_TABLET | Freq: Two times a day (BID) | ORAL | Status: DC
  Administered 2013-06-24 – 2013-06-26 (×4): 50 mg via ORAL
  Filled 2013-06-24 (×7): qty 1

## 2013-06-24 MED ORDER — METOPROLOL TARTRATE 1 MG/ML IV SOLN
5.0000 mg | Freq: Four times a day (QID) | INTRAVENOUS | Status: DC | PRN
  Administered 2013-06-24: 5 mg via INTRAVENOUS
  Filled 2013-06-24: qty 5

## 2013-06-24 MED ORDER — ACETAMINOPHEN 325 MG PO TABS
650.0000 mg | ORAL_TABLET | ORAL | Status: DC | PRN
  Administered 2013-06-25 – 2013-07-15 (×5): 650 mg via ORAL
  Filled 2013-06-24 (×5): qty 2

## 2013-06-24 MED ORDER — SODIUM CHLORIDE 0.9 % IV SOLN
INTRAVENOUS | Status: AC
  Filled 2013-06-24 (×3): qty 200

## 2013-06-24 MED ORDER — ACD FORMULA A 0.73-2.45-2.2 GM/100ML VI SOLN
500.0000 mL | Status: DC
  Filled 2013-06-24 (×9): qty 500

## 2013-06-24 MED ORDER — POTASSIUM CHLORIDE 10 MEQ/50ML IV SOLN
INTRAVENOUS | Status: AC
  Administered 2013-06-24: 10 meq
  Filled 2013-06-24: qty 150

## 2013-06-24 MED ORDER — ACD FORMULA A 0.73-2.45-2.2 GM/100ML VI SOLN
Status: AC
  Administered 2013-06-24: 500 mL
  Filled 2013-06-24: qty 500

## 2013-06-24 MED ORDER — POTASSIUM CHLORIDE 10 MEQ/100ML IV SOLN
10.0000 meq | INTRAVENOUS | Status: AC
  Administered 2013-06-24 (×5): 10 meq via INTRAVENOUS
  Filled 2013-06-24 (×3): qty 100

## 2013-06-24 MED ORDER — SODIUM CHLORIDE 0.9 % IV SOLN
4.0000 g | Freq: Once | INTRAVENOUS | Status: AC
  Administered 2013-06-24: 4 g via INTRAVENOUS
  Filled 2013-06-24: qty 40

## 2013-06-24 MED ORDER — CALCIUM CARBONATE ANTACID 500 MG PO CHEW
CHEWABLE_TABLET | ORAL | Status: AC
  Administered 2013-06-24: 400 mg
  Filled 2013-06-24: qty 2

## 2013-06-24 NOTE — Consult Note (Signed)
Because the patient has had prior gastric bypass she is really not a good candidate for G-tube.  She would need a jejunostomy feeding tube.  Will keep NPO after MN and plan for possible J-tube tomorrow.  Marta LamasJames O. Gae BonWyatt, [name], MD, FACS [phone]--pager [phone]--office [facility]

## 2013-06-24 NOTE — Progress Notes (Signed)
Pt Hr sustaining in 130's on call paged new orders given will continue to monitor

## 2013-06-24 NOTE — Progress Notes (Signed)
NUTRITION FOLLOW-UP  DOCUMENTATION CODES Per approved criteria  -Not Applicable   INTERVENTION:  Discontinue calorie count. Recommend initiation of nutrition support as oral intake is meeting <25% of estimated kcal and protein needs. The American Society for Parenteral and Enteral Nutrition (A.S.P.E.N.) and the Society of Critical Care Medicine [facility]) nutrition guidelines state that for those who cannot take adequate calories with oral supplementation alone, enteral nutrition via feeding tube should be considered and is preferred over parenteral nutrition if the gastrointestinal tract is functional. If team agrees with enteral nutrition, recommend post-pyloric tube. Initiate Osmolite 1.5 at 25 ml/hr, advance by 10 ml q 8 hours, to goal of 45 ml/hr. Goal rate will provide: 1620 kcal, 68 grams protein, 823 ml free water. Recommend monitoring of refeeding labs   Continue to encourage Dysphagia 3 meals with Resource Breeze po TID. Continue full assistance with all meals.  RD to continue to follow nutrition care plan.  NUTRITION DIAGNOSIS: Inadequate oral intake related to AMS as evidenced by limited oral intake.   Goal: Intake to meet >90% of estimated nutrition needs.  Monitor:  weight trends, lab trends, I/O's, PO intake, supplement tolerance  ASSESSMENT: PMHx significant for catatonia, HTN, anxiety, gastric bypass. From SNF.  Recent admission from 12/13 - 12/28 with toxic metabolic encephalopathy, hospital course was complicated with pyelonephritis and C. Difficile colitis. Pt was scheduled for PEG on 1/20, was found to be agitated and confused and was sent to ED.   Neuro saw pt on 1/21 and recommended plasma exchanges every other day as well as suppressive steroids to reduce her seizure foci.  Per CSW note from [facility], SW at [facility] reported that patient has not eaten since she was admitted to the facility. (PA contacted SNF and they confirmed that pt has had NO intake for >1 week  PTA.)  SW stated that the facility tried to get a PEG, but pt has a hx of gastric bypass, and SW was wondering if pt could get a J-tube during this admission.   Pt developed frequent amount of large stooling from vagina on 1/25 and was sent to SDU. Current ordered for Remeron.  Current potassium is "critically low" at 2.8. Currently ordered for IVF of KCl.  Prealbumin is low at 15.8.  Saturday intake: Pt refused all meals.  Sunday intake: Breakfast: 330 kcal, 9 grams protein Lunch: 275 kcal, 7 grams protein Dinner: nothing recorded  Total intake: 605 kcal (38% of minimum estimated needs)  16 protein (21% of minimum estimated needs)  Height: Ht Readings from Last 1 Encounters:  06/20/13 5\' 3"  (1.6 m)    Weight: Wt Readings from Last 1 Encounters:  06/24/13 162 lb 14.7 oz (73.9 kg)  Admit wt 152 lb  BMI:  Body mass index is 28.87 kg/(m^2). Overweight  Estimated Nutritional Needs: Kcal: 1600 - 1800 Protein: 75 - 90 g Fluid: 1.8 - 2 liters  Skin: intact  Diet Order: Dysphagia 3; thin liquids  EDUCATION NEEDS: -No education needs identified at this time   Intake/Output Summary (Last 24 hours) at 06/24/13 1015 Last data filed at 06/24/13 0600  Gross per 24 hour  Intake   1320 ml  Output    573 ml  Net    747 ml    Last BM: 1/25  Labs:   Recent Labs Lab 06/22/13 0500 06/22/13 0916 06/23/13 0455 06/24/13 0503  NA 143 145 145 149*  K 4.0 3.9 3.9 2.8*  CL 109 107 114* 117*  CO2 21  --  19 19  BUN 10 10 12 15   CREATININE 0.66 0.80 0.60 0.63  CALCIUM 8.8  --  8.8 8.1*  GLUCOSE 115* 110* 121* 95    CBG (last 3)   Recent Labs  06/22/13 0745 06/23/13 0754 06/24/13 0804  GLUCAP 118* 124* 93   Prealbumin  Date/Time Value Range Status  06/21/2013  6:25 PM 15.8* 17.0 - 34.0 mg/dL Final     [facility]    Scheduled Meds: . therapeutic plasma exchange solution   Dialysis Q1 Hr x 3  . calcium gluconate IVPB  4 g Intravenous  Once  . feeding supplement (RESOURCE BREEZE)  1 Container Oral TID BM  . levETIRAcetam  1,000 mg Oral BID  . metoprolol tartrate  25 mg Oral BID  . mirtazapine  30 mg Oral QHS  . multivitamin with minerals  1 tablet Oral Daily  . piperacillin-tazobactam (ZOSYN)  IV  3.375 g Intravenous Q8H  . potassium chloride  10 mEq Intravenous Q1 Hr x 6  . risperiDONE  0.5 mg Oral BID  . sodium chloride  3 mL Intravenous Q12H  . thiamine  100 mg Oral Daily  . valproic acid  500 mg Oral TID    Continuous Infusions: . sodium chloride 100 mL/hr at 06/23/13 [address]    Jala Dundon MS, RD, LDN Pager: [phone] After-hours pager: [phone]

## 2013-06-24 NOTE — Progress Notes (Addendum)
Physician notified: Joan [name] At: 1100  Regarding: HR sustained 140-149. ST. FYI pt to go for plasmapheresis soon.  Awaiting return response.   Returned Response at: 1102  Order(s): Metoprolol increased. Same HR as yesterday, continue to monitor.

## 2013-06-24 NOTE — Progress Notes (Signed)
[name] has [name], Son listed as responsible party, then [name]. Darlina RumpfAzalea,  Contacted nursing supervisor at [phone] to get information for admission database.  Per Marcelino DusterMichelle, refused flu shot, refused PNA shot. Hasn't been walking, is able to complete some ADL (ie feeding), but refuses. Negative TB test on 12/29.

## 2013-06-24 NOTE — Consult Note (Signed)
Reason for Consult: gastric tube placement Referring Physician: Dr. Lajean Manes     HPI: Joan [name] is a 53 year old nursing home resident with a past medical history of seizure disorder, ESRD, htn, anxiety, encephalopathy who presented to [facility] due to confusion, agitation and after being found "comatose."  She was recently admitted 12/13-12/28 with toxic metabolic encephalopathy, pyelonephritis, c. Diff colitis.  Upon ED arrival, she was found to be tachycardic and hypertensive.  The etiology of confusion and lethargy attributed to seizures, neurology is on board.  The patient is also having plasmapheresis today.  She has a history of gastric bypass in 2010, this was reversed and appeared to have a partial gastrectomy in September of 2012 due to weight loss, dysphagia.  We have been asked to evaluate the patient for possible gastric tube placement.   The patient is able to convey that she has a poor appetite and complains of dysphagia.  She was unable to answer any other questions.  Her surgical history was obtained through care everywhere.  She sees a gastroenterologist and has also been seeing GYN due to incontinence.   She is alert, but incoherent.    Per nursing, the patient is not taking in anything by mouth.     Past Medical History  Diagnosis Date  . Peripheral neuropathy   . Fibromyalgia   . Hypertension   . Anxiety   . Catatonia   . Seizures     Past Surgical History  Procedure Laterality Date  . Gastric bypass    . Abdominal hysterectomy      Family History  Problem Relation Age of Onset  . Hypertension Mother   . Diabetes Father     Social History:  reports that she has never smoked. She does not have any smokeless tobacco history on file. She reports that she does not drink alcohol. Her drug history is not on file.  Allergies:  Allergies  Allergen Reactions  . Morphine And Related Other (See Comments)    REACTION: Causes pain  . Paroxetine Hcl Other (See Comments)     REACTION:  unknown  . Penicillins Other (See Comments)    REACTION: Makes her body hurt.  . Sertraline Hcl Other (See Comments)    REACTION:  unknown    Medications:  Scheduled: . calcium carbonate  2 tablet Oral Q3H  . calcium gluconate IVPB  4 g Intravenous Once  . feeding supplement (RESOURCE BREEZE)  1 Container Oral TID BM  . levETIRAcetam  1,000 mg Oral BID  . metoprolol tartrate  50 mg Oral BID  . mirtazapine  30 mg Oral QHS  . multivitamin with minerals  1 tablet Oral Daily  . piperacillin-tazobactam (ZOSYN)  IV  3.375 g Intravenous Q8H  . risperiDONE  0.5 mg Oral BID  . sodium chloride  3 mL Intravenous Q12H  . thiamine  100 mg Oral Daily  . valproic acid  500 mg Oral TID    Results for orders placed during the hospital encounter of 06/19/13 (from the past 48 hour(s))  BASIC METABOLIC PANEL     Status: Abnormal   Collection Time    06/23/13  4:55 AM      Result Value Range   Sodium 145  137 - 147 mEq/L   Potassium 3.9  3.7 - 5.3 mEq/L   Chloride 114 (*) 96 - 112 mEq/L   CO2 19  19 - 32 mEq/L   Glucose, Bld 121 (*) 70 - 99 mg/dL  BUN 12  6 - 23 mg/dL   Creatinine, Ser 0.60  0.50 - 1.10 mg/dL   Calcium 8.8  8.4 - 10.5 mg/dL   GFR calc non Af Amer >90  >90 mL/min   GFR calc Af Amer >90  >90 mL/min   Comment: (NOTE)     The eGFR has been calculated using the CKD EPI equation.     This calculation has not been validated in all clinical situations.     eGFR's persistently <90 mL/min signify possible Chronic Kidney     Disease.  CBC     Status: Abnormal   Collection Time    06/23/13  4:55 AM      Result Value Range   WBC 4.0  4.0 - 10.5 K/uL   RBC 3.25 (*) 3.87 - 5.11 MIL/uL   Hemoglobin 10.2 (*) 12.0 - 15.0 g/dL   HCT 30.6 (*) 36.0 - 46.0 %   MCV 94.2  78.0 - 100.0 fL   MCH 31.4  26.0 - 34.0 pg   MCHC 33.3  30.0 - 36.0 g/dL   RDW 17.6 (*) 11.5 - 15.5 %   Platelets 72 (*) 150 - 400 K/uL   Comment: REPEATED TO VERIFY     CONSISTENT WITH PREVIOUS RESULT   VITAMIN B12     Status: None   Collection Time    06/23/13  4:55 AM      Result Value Range   Vitamin B-12 331  211 - 911 pg/mL   Comment: Performed at [place]     Status: None   Collection Time    06/23/13  4:55 AM      Result Value Range   Folate >20.0     Comment: (NOTE)     Reference Ranges            Deficient:       0.4 - 3.3 ng/mL            Indeterminate:   3.4 - 5.4 ng/mL            Normal:              > 5.4 ng/mL     Performed at [place], [place]     Status: Abnormal   Collection Time    06/23/13  7:54 AM      Result Value Range   Glucose-Capillary 124 (*) 70 - 99 mg/dL  CULTURE, BLOOD (ROUTINE X 2)     Status: None   Collection Time    06/23/13  2:02 PM      Result Value Range   Specimen Description BLOOD LEFT ARM     Special Requests BOTTLES DRAWN AEROBIC ONLY 10CC     Culture  Setup Time       Value: 06/23/2013 19:55     Performed at [facility]   Culture       Value:        BLOOD CULTURE RECEIVED NO GROWTH TO DATE CULTURE WILL BE HELD FOR 5 DAYS BEFORE ISSUING A FINAL NEGATIVE REPORT     Performed at [facility]   Report Status PENDING    CULTURE, BLOOD (ROUTINE X 2)     Status: None   Collection Time    06/23/13  2:14 PM      Result Value Range   Specimen Description BLOOD LEFT HAND     Special Requests BOTTLES DRAWN AEROBIC ONLY 5.5 CC  Culture  Setup Time       Value: 06/23/2013 19:55     Performed at [facility]   Culture       Value:        BLOOD CULTURE RECEIVED NO GROWTH TO DATE CULTURE WILL BE HELD FOR 5 DAYS BEFORE ISSUING A FINAL NEGATIVE REPORT     Performed at [facility]   Report Status PENDING    OCCULT BLOOD X 1 CARD TO LAB, STOOL     Status: None   Collection Time    06/23/13  5:08 PM      Result Value Range   Fecal Occult Bld NEGATIVE  NEGATIVE  CLOSTRIDIUM DIFFICILE BY PCR     Status: None   Collection Time    06/23/13  5:08 PM      Result  Value Range   C difficile by pcr NEGATIVE  NEGATIVE  URINALYSIS, ROUTINE W REFLEX MICROSCOPIC     Status: Abnormal   Collection Time    06/23/13  6:15 PM      Result Value Range   Color, Urine AMBER (*) YELLOW   Comment: BIOCHEMICALS MAY BE AFFECTED BY COLOR   APPearance CLEAR  CLEAR   Specific Gravity, Urine 1.035 (*) 1.005 - 1.030   pH 5.5  5.0 - 8.0   Glucose, UA NEGATIVE  NEGATIVE mg/dL   Hgb urine dipstick NEGATIVE  NEGATIVE   Bilirubin Urine NEGATIVE  NEGATIVE   Ketones, ur NEGATIVE  NEGATIVE mg/dL   Protein, ur NEGATIVE  NEGATIVE mg/dL   Urobilinogen, UA 1.0  0.0 - 1.0 mg/dL   Nitrite NEGATIVE  NEGATIVE   Leukocytes, UA NEGATIVE  NEGATIVE   Comment: MICROSCOPIC NOT DONE ON URINES WITH NEGATIVE PROTEIN, BLOOD, LEUKOCYTES, NITRITE, OR GLUCOSE <1000 mg/dL.  BASIC METABOLIC PANEL     Status: Abnormal   Collection Time    06/24/13  5:03 AM      Result Value Range   Sodium 149 (*) 137 - 147 mEq/L   Potassium 2.8 (*) 3.7 - 5.3 mEq/L   Comment: CRITICAL RESULT CALLED TO, READ BACK BY AND VERIFIED WITH:     T IRBY,RN 06/24/13 0550 RHOLMES   Chloride 117 (*) 96 - 112 mEq/L   CO2 19  19 - 32 mEq/L   Glucose, Bld 95  70 - 99 mg/dL   BUN 15  6 - 23 mg/dL   Creatinine, Ser 0.63  0.50 - 1.10 mg/dL   Calcium 8.1 (*) 8.4 - 10.5 mg/dL   GFR calc non Af Amer >90  >90 mL/min   GFR calc Af Amer >90  >90 mL/min   Comment: (NOTE)     The eGFR has been calculated using the CKD EPI equation.     This calculation has not been validated in all clinical situations.     eGFR's persistently <90 mL/min signify possible Chronic Kidney     Disease.  CBC     Status: Abnormal   Collection Time    06/24/13  5:03 AM      Result Value Range   WBC 4.2  4.0 - 10.5 K/uL   RBC 2.73 (*) 3.87 - 5.11 MIL/uL   Hemoglobin 8.6 (*) 12.0 - 15.0 g/dL   HCT 25.9 (*) 36.0 - 46.0 %   MCV 94.9  78.0 - 100.0 fL   MCH 31.5  26.0 - 34.0 pg   MCHC 33.2  30.0 - 36.0 g/dL   RDW 17.9 (*) 11.5 -  15.5 %   Platelets 59  (*) 150 - 400 K/uL   Comment: CONSISTENT WITH PREVIOUS RESULT  GLUCOSE, [facility]     Status: None   Collection Time    06/24/13  8:04 AM      Result Value Range   Glucose-Capillary 93  70 - 99 mg/dL    Mr Cervical Spine Wo Contrast  06/22/2013   CLINICAL DATA:  Right hemiparesis. Possible autoimmune encephalitis.  EXAM: MRI CERVICAL SPINE WITHOUT CONTRAST  TECHNIQUE: Multiplanar, multisequence MR imaging was performed. No intravenous contrast was administered.  COMPARISON:  No comparison cervical spine MR. brain MR 06/20/2013.  FINDINGS: On this motion grade examination, artifact extends through the cervical cord however, no definitive cervical cord signal abnormality is noted. Cervical medullary junction unremarkable. Intracranial structures as detailed on recent MR of the brain.  Visualized paravertebral structures unremarkable. Both vertebral arteries are patent.  C2-3:  Negative.  C3-4:  Minimal right foraminal narrowing.  C4-5:  Minimal right foraminal narrowing.  C5-6:  Negative.  C6-7:  Negative.  C7-T1:  Negative.  T1-2:  Negative.  T2-3: Minimal Schmorl's node deformity.  Minimal bulge.  T3-4: Minimal bulge.  T4-5: Minimal bulge.  IMPRESSION: No evidence of cervical disc herniation.  Minimal degenerative changes upper thoracic spine.  Please see above.   Electronically Signed   By: Chauncey Cruel M.D.   On: 06/22/2013 19:38    Review of Systems  Unable to perform ROS  Blood pressure 130/60, pulse 115, temperature 98.5 F (36.9 C), temperature source Tympanic, resp. rate 19, height 5' 3" (1.6 m), weight 161 lb 2.5 oz (73.1 kg), SpO2 100.00%. Physical Exam  Constitutional: No distress.  Cardiovascular: Normal rate, regular rhythm, normal heart sounds and intact distal pulses.  Exam reveals no gallop and no friction rub.   No murmur heard. tachycardic  Respiratory: Effort normal and breath sounds normal. No respiratory distress. She has no wheezes. She has no rales.  GI: Soft. Bowel  sounds are normal. She exhibits no distension and no mass. There is no tenderness. There is no rebound and no guarding.  Pelvic scar, horizontal Laparoscopic sites at supraumbilical region, right upper quadrant   Musculoskeletal: She exhibits edema.  Neurological: She is alert.  Skin: Skin is dry. She is not diaphoretic.  Psychiatric:  Unable to appropriately answer questions.    Assessment/Plan: Acute encephalopathy  Seizure disorder Fecal incontinence  Hx Gastric Bypass 2010, revision with partial gastrectomy in 2012 Protein calorie malnutrition   She will likely require a open J tube placement which we may be able to perform tomorrow or Wednesday.  I discussed this with the patients POA [name].  He verbalizes understanding and agrees to proceed if deemed necessary by Dr. Hulen Skains. At this time, obtain a pre-albumin to evaluate her nutritional status.     Erby Pian 06/24/2013, 3:29 PM

## 2013-06-24 NOTE — Progress Notes (Signed)
Critical Potassium of 2.8 , R reader called ordered six runs of potassium will continue to monitor

## 2013-06-24 NOTE — Clinical Social Work Note (Signed)
[facility] to confirm that DON was contacted to discuss her concerns about patient not eating at facility. Admission coordinator states that DON was contacted and that they were able to discuss concerns. She states that DON will want to touch base with PA or MD again before patient DC back to the facility to confirm that patient is eating.  Roddie McBryant Korbyn Chopin, [place], [place], [phone]

## 2013-06-24 NOTE — Consult Note (Signed)
Case discussed, patient would benefit from a low dose of Risperidone to help with psychosis. Patient seems to be delirious at this time.

## 2013-06-24 NOTE — Progress Notes (Signed)
06/24/13 0200  Vitals  ECG Heart Rate ! 135    06/24/13 0200  Vitals  ECG Heart Rate ! 135  MD notified new orders recived will continue to monitor

## 2013-06-24 NOTE — Progress Notes (Signed)
Pt has not voided since being incontinent @ 2000 hrs, bladder scanned pt x 3 and only 150 (aprox) cc's shown will recheck in a few hours

## 2013-06-24 NOTE — Progress Notes (Signed)
Pt received 6 runs of 10 mEq potassium throughout shift. Unable to finish prior to plasmapheresis, completed by end of shift.

## 2013-06-24 NOTE — Progress Notes (Signed)
Subjective: Awake, concerned today about not being able to think striaght. She has not been eating, at all in quite some time.   Exam: Filed Vitals:   06/24/13 1612  BP:   Pulse:   Temp: 98 F (36.7 C)  Resp:    Gen: In bed, NAD MS: Tearfull this morning.  NW:GNFAOCN:PERRL, EOMI, VFF Motor: Unclear whether the right hemipareiss is due to pain from PICC line or is true hemiparesis.  Sensory:intact to LT   Impression: 53 year old female with altered mental status that is persistent. I continue to think she was in status epilepticus for multiple days at presentation of the previous hospitalization. No signs of malignancy on CT chest or TV ultrasound or previous CT abd/pelvis. Given her continued hallucinations, and other concerning factors, autoimmune encephalitis is certainly a consideration. Her CSF panel was negative for this, however she will need serum panel sent as well. This will take some time to come back, and I would favor treating empirically in the interim with steroids and plasmapheresis.   Recommendations: 1) PLEX QOD, treatment 3/5 today  2) I think that she does have a chance for continued improvement. If nutrition is unable to be encouraged, then I do think that other means(i.e. Feeding tube) may be necessary.  3) will continue to follow.   Ritta SlotMcNeill Karalyne Nusser, MD [facility] [phone]  If 7pm- 7am, please page neurology on call at [phone].

## 2013-06-24 NOTE — Progress Notes (Signed)
[facility] PROGRESS NOTE  Bernardina Cacho ZOX:[id number] DOB: [birth year] DOA: 06/19/2013 PCP: [name], MD  HPI/Subjective: 53 yr old female presents with a CC of Altered mental status. Pt has a PMH of peripheral neuropathy, seizures, catatonia, anxiety, HTN, and fibromyalgia. Pt was previously admitted on 12/13 thru 12/28 with toxic metabolic encephalopathy.  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  EEG showed no recurrent seizures. A 24 hour EEG  showed an intermittent delta pattern felt to represent Frontal Intermittent Rhythmic Delta Activity. Pt was started on two antiepileptics.  This admission, she was again found altered and seemed "comatose" . Since d/c in December, pt has had persistent confusion and is confined to a wheelchair at [facility]. She is currently confused and experiencing visual hallucinations.  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 -Pt received diatech catheter placement and plasmapheresis on 06/20/13.   -Plan is for plasmapheresis every other day and Solumedrol 500 mg bid x 3 days. D/C on 1/24. -MRI of the brain showed no acute abnormality.  Detailed results listed below. -Ammonia level is normal - CT chest and transvaginal ultrasound showed no abnormalities. -[facility] paraneoplastic panel reported to be negative, MRI of the C-spine is negative for acute findings  Pending:   - serum NMDA.   Tachycardia -Check 12-lead EKG, patient is on atenolol 12.5 not sure this is therapeutic dose. -Metoprolol increased to 50 mg PO bid, if continues to be tachycardic may needs cardiology eval before her surgery.  Hx of Convulsions/Seizures  -Pt has a PMH of seizures and catatonia.   -MRI of the brain showed thalamic signal abnormality during previous admission.  No longer present on current MRI. -Pt  currently taking Keppra and Depakene.  Increased Depakene to 500mg  tid -Valproic acid lvl 35.1 was low.  Re-check levels in the AM  Hx of Anxiety  -Continue pt on Remeron -Patient appears to have had a psychiatric history  -Current illness is bringing out psychiatric symptoms. -Psychiatry consulted as psychotropic medications may help with her current symptoms. -Psych prescribed Risperdal .5 mg bid.  Hypertension  -Pt has a PMH of HTN and most recent BP reading was 151/108 -Likely secondary to anxiety and high dose steroids. -Continue atenolol and hydralazine PRN  Malnutrition with history of Gastric Bypass -Per SNF patient has had no PO intake for over 1 week prior to admission. -SNF sent her to hospital for J tube placement. -On my exam patient appears to drink if it is held to her mouth. -Nutrition consulted. -Speech consulted. -Will check pre-albumin to determine if patient may need TNA. -Gen Surg consulted, likely to have J-tube placed.  Folate deficiency -Replete parenterally.  Recent hx of c-diff (05/18/13)  -Pt negative for C-diff at this admit  Thrombocytopenia -Discontinue heparin, seems to be chronic up and down, could be secondary to folate deficiency.  DVT Prophylaxis:  IV Heparin  Code Status: Full Code Family Communication: Son was contacted to explain procedures and status Disposition Plan: D/C to SNF when medically appropriate   Consultants:  Psych  Neuro  Procedures:  Plasmapheresis  PICC Line Placement  Antibiotics:  None  Objective: Filed Vitals:   06/24/13 1507 06/24/13 1516 06/24/13 1544 06/24/13 1612  BP: 123/65 130/60 134/50   Pulse:   122   Temp:  98.5 F (36.9 C)  98 F (36.7 C)  TempSrc:  Tympanic  Oral  Resp: 19 19 17    Height:      Weight:      SpO2:  100%      Intake/Output Summary (Last 24 hours) at 06/24/13 1655 Last data filed at 06/24/13 1200  Gross per 24 hour  Intake   2160 ml  Output    573 ml  Net   1587  ml   Filed Weights   06/22/13 0900 06/24/13 0741 06/24/13 1304  Weight: 69.9 kg (154 lb 1.6 oz) 73.9 kg (162 lb 14.7 oz) 73.1 kg (161 lb 2.5 oz)    Exam: General: awake and alert, frightful, appears mild distress, rambling HEENT: PERRLA, EOMI, Anicteic Sclera,  Neck: Supple, no JVD, no masses, no lymphadenopathy  Cardiovascular:  tachycardic rhythm, S1 S2 auscultated, no rubs, murmurs or gallops.  Respiratory: Clear to auscultation bilaterally with equal chest rise  Abdomen: Soft, nontender, nondistended, + bowel sounds  Extremities: No swelling, or cyanosis. Pain in the legs elicited  Neuro: Diminished strength equally in all 4 ext.  Skin: No scars or scratches observed  Psych: Abnormal affect, pt appears frightened, rambling, but will follow commands.     Data Reviewed: Basic Metabolic Panel:  Recent Labs Lab 06/19/13 1925 06/20/13 0515 06/20/13 1940 06/22/13 0500 06/22/13 0916 06/23/13 0455 06/24/13 0503  NA 142 141 140 143 145 145 149*  K 3.6* 4.2 3.9 4.0 3.9 3.9 2.8*  CL 109 106 105 109 107 114* 117*  CO2 21 20  --  21  --  19 19  GLUCOSE 94 112* 142* 115* 110* 121* 95  BUN 6 6 5* 10 10 12 15   CREATININE 0.64 0.69 0.80 0.66 0.80 0.60 0.63  CALCIUM 8.3* 8.6  --  8.8  --  8.8 8.1*   Liver Function Tests:  Recent Labs Lab 06/18/13 1143 06/19/13 1925 06/20/13 0515  AST 30 23 25   ALT 11 9 9   ALKPHOS 43 35* 38*  BILITOT 0.4 0.4 0.4  PROT 6.7 5.9* 6.3  ALBUMIN 3.1* 2.6* 2.7*    Recent Labs Lab 06/19/13 1925  AMMONIA 24   CBC:  Recent Labs Lab 06/19/13 1925 06/20/13 0515 06/20/13 1940 06/22/13 0500 06/22/13 0916 06/23/13 0455 06/24/13 0503  WBC 5.0 2.3*  --  6.1  --  4.0 4.2  NEUTROABS 2.8  --   --   --   --   --   --   HGB 11.2* 12.0 12.6 10.6* 10.9* 10.2* 8.6*  HCT 34.8* 36.3 37.0 31.9* 32.0* 30.6* 25.9*  MCV 96.9 94.8  --  93.8  --  94.2 94.9  PLT 113* 129*  --  101*  --  72* 59*   CBG:  Recent Labs Lab 06/20/13 0834 06/21/13 0800  06/22/13 0745 06/23/13 0754 06/24/13 0804  GLUCAP 111* 109* 118* 124* 93    Recent Results (from the past 240 hour(s))  CLOSTRIDIUM DIFFICILE BY PCR     Status: None   Collection Time    06/19/13  5:14 PM      Result Value Range Status   C difficile by pcr NEGATIVE  NEGATIVE Final  MRSA PCR SCREENING     Status: None   Collection Time    06/19/13  5:14 PM      Result Value Range Status   MRSA by PCR NEGATIVE  NEGATIVE Final   Comment:            The GeneXpert MRSA Assay (FDA     approved for NASAL specimens  only), is one component of a     comprehensive MRSA colonization     surveillance program. It is not     intended to diagnose MRSA     infection nor to guide or     monitor treatment for     MRSA infections.  STOOL CULTURE     Status: None   Collection Time    06/19/13  5:14 PM      Result Value Range Status   Specimen Description STOOL   Final   Special Requests NONE   Final   Culture     Final   Value: NO SALMONELLA, SHIGELLA, CAMPYLOBACTER, YERSINIA, OR E.COLI 0157:H7 ISOLATED     Performed at [facility]   Report Status 06/23/2013 FINAL   Final  CULTURE, BLOOD (ROUTINE X 2)     Status: None   Collection Time    06/23/13  2:02 PM      Result Value Range Status   Specimen Description BLOOD LEFT ARM   Final   Special Requests BOTTLES DRAWN AEROBIC ONLY 10CC   Final   Culture  Setup Time     Final   Value: 06/23/2013 19:55     Performed at [facility]   Culture     Final   Value:        BLOOD CULTURE RECEIVED NO GROWTH TO DATE CULTURE WILL BE HELD FOR 5 DAYS BEFORE ISSUING A FINAL NEGATIVE REPORT     Performed at [facility]   Report Status PENDING   Incomplete  CULTURE, BLOOD (ROUTINE X 2)     Status: None   Collection Time    06/23/13  2:14 PM      Result Value Range Status   Specimen Description BLOOD LEFT HAND   Final   Special Requests BOTTLES DRAWN AEROBIC ONLY 5.5 CC   Final   Culture  Setup Time     Final   Value:  06/23/2013 19:55     Performed at [facility]   Culture     Final   Value:        BLOOD CULTURE RECEIVED NO GROWTH TO DATE CULTURE WILL BE HELD FOR 5 DAYS BEFORE ISSUING A FINAL NEGATIVE REPORT     Performed at [facility]   Report Status PENDING   Incomplete  CLOSTRIDIUM DIFFICILE BY PCR     Status: None   Collection Time    06/23/13  5:08 PM      Result Value Range Status   C difficile by pcr NEGATIVE  NEGATIVE Final     Studies: Mr Cervical Spine Wo Contrast  06/22/2013   CLINICAL DATA:  Right hemiparesis. Possible autoimmune encephalitis.  EXAM: MRI CERVICAL SPINE WITHOUT CONTRAST  TECHNIQUE: Multiplanar, multisequence MR imaging was performed. No intravenous contrast was administered.  COMPARISON:  No comparison cervical spine MR. brain MR 06/20/2013.  FINDINGS: On this motion grade examination, artifact extends through the cervical cord however, no definitive cervical cord signal abnormality is noted. Cervical medullary junction unremarkable. Intracranial structures as detailed on recent MR of the brain.  Visualized paravertebral structures unremarkable. Both vertebral arteries are patent.  C2-3:  Negative.  C3-4:  Minimal right foraminal narrowing.  C4-5:  Minimal right foraminal narrowing.  C5-6:  Negative.  C6-7:  Negative.  C7-T1:  Negative.  T1-2:  Negative.  T2-3: Minimal Schmorl's node deformity.  Minimal bulge.  T3-4: Minimal bulge.  T4-5: Minimal bulge.  IMPRESSION: No evidence  of cervical disc herniation.  Minimal degenerative changes upper thoracic spine.  Please see above.   Electronically Signed   By: Bridgett Larsson M.D.   On: 06/22/2013 19:38    Scheduled Meds: . calcium carbonate  2 tablet Oral Q3H  . calcium gluconate IVPB  4 g Intravenous Once  . divalproex  500 mg Oral Q8H  . feeding supplement (RESOURCE BREEZE)  1 Container Oral TID BM  . levETIRAcetam  1,000 mg Oral BID  . metoprolol tartrate  50 mg Oral BID  . mirtazapine  30 mg Oral QHS  .  multivitamin with minerals  1 tablet Oral Daily  . piperacillin-tazobactam (ZOSYN)  IV  3.375 g Intravenous Q8H  . risperiDONE  0.5 mg Oral BID  . sodium chloride  3 mL Intravenous Q12H  . thiamine  100 mg Oral Daily   Continuous Infusions: . sodium chloride 100 mL/hr at 06/23/13 1523  . citrate dextrose      Principal Problem:   Acute encephalopathy Active Problems:   Convulsions/seizures   Toxic metabolic encephalopathy   Psychosis    Horris Latino PA-S  Algis Downs, PA-C [facility] Pager [phone]. If 7PM-7AM, please contact night-coverage at [email or web address], password [facility] 06/24/2013, 4:55 PM  LOS: 5 days

## 2013-06-24 NOTE — Progress Notes (Signed)
Pt incontinent BM and urine. Stool yellow, watery. Stool noted in vagina.

## 2013-06-24 NOTE — Progress Notes (Signed)
Utilization review completed.  

## 2013-06-25 ENCOUNTER — Encounter (HOSPITAL_COMMUNITY): Payer: Medicare Other | Admitting: Critical Care Medicine

## 2013-06-25 ENCOUNTER — Encounter (HOSPITAL_COMMUNITY): Payer: Self-pay | Admitting: Critical Care Medicine

## 2013-06-25 ENCOUNTER — Inpatient Hospital Stay (HOSPITAL_COMMUNITY): Payer: Medicare Other | Admitting: Critical Care Medicine

## 2013-06-25 ENCOUNTER — Encounter (HOSPITAL_COMMUNITY)
Admission: AD | Disposition: A | Discharge status: Skilled Nursing Facility | Payer: Medicare Other | Source: Ambulatory Visit | Attending: Family Medicine

## 2013-06-25 DIAGNOSIS — G0490 Encephalitis and encephalomyelitis, unspecified: Secondary | ICD-10-CM | POA: Diagnosis not present

## 2013-06-25 DIAGNOSIS — R4182 Altered mental status, unspecified: Secondary | ICD-10-CM | POA: Diagnosis not present

## 2013-06-25 HISTORY — PX: JEJUNOSTOMY: SHX313

## 2013-06-25 LAB — BASIC METABOLIC PANEL
BUN: 9 mg/dL (ref 6–23)
CHLORIDE: 122 meq/L — AB (ref 96–112)
CO2: 17 mEq/L — ABNORMAL LOW (ref 19–32)
Calcium: 7.9 mg/dL — ABNORMAL LOW (ref 8.4–10.5)
Creatinine, Ser: 0.6 mg/dL (ref 0.50–1.10)
GFR calc Af Amer: >90 mL/min (ref >90)
GFR calc non Af Amer: >90 mL/min (ref >90)
Glucose, Bld: 90 mg/dL (ref 70–99)
Potassium: 3.2 mEq/L — ABNORMAL LOW (ref 3.7–5.3)
SODIUM: 150 meq/L — AB (ref 137–147)

## 2013-06-25 LAB — CBC
HEMATOCRIT: 23.4 % — AB (ref 36.0–46.0)
HEMOGLOBIN: 7.9 g/dL — AB (ref 12.0–15.0)
MCH: 32.2 pg (ref 26.0–34.0)
MCHC: 33.8 g/dL (ref 30.0–36.0)
MCV: 95.5 fL (ref 78.0–100.0)
Platelets: 46 10*3/uL — ABNORMAL LOW (ref 150–400)
RBC: 2.45 MIL/uL — ABNORMAL LOW (ref 3.87–5.11)
RDW: 17.8 % — ABNORMAL HIGH (ref 11.5–15.5)
WBC: 3.8 10*3/uL — AB (ref 4.0–10.5)

## 2013-06-25 LAB — GLUCOSE, CAPILLARY: GLUCOSE-CAPILLARY: 94 mg/dL (ref 70–99)

## 2013-06-25 LAB — SAVE SMEAR

## 2013-06-25 LAB — ABO/RH: ABO/RH(D): A POS

## 2013-06-25 LAB — PREALBUMIN: Prealbumin: 11.7 mg/dL — ABNORMAL LOW (ref 17.0–34.0)

## 2013-06-25 SURGERY — CREATION, JEJUNOSTOMY
Anesthesia: General

## 2013-06-25 MED ORDER — ONDANSETRON HCL 4 MG/2ML IJ SOLN
INTRAMUSCULAR | Status: AC
  Filled 2013-06-25: qty 2

## 2013-06-25 MED ORDER — OSMOLITE 1.5 CAL PO LIQD
1000.0000 mL | ORAL | Status: DC
  Administered 2013-06-26 – 2013-06-28 (×2): 1000 mL
  Filled 2013-06-25 (×3): qty 1000

## 2013-06-25 MED ORDER — GLYCOPYRROLATE 0.2 MG/ML IJ SOLN
INTRAMUSCULAR | Status: DC | PRN
  Administered 2013-06-25: 0.6 mg via INTRAVENOUS

## 2013-06-25 MED ORDER — PHENYLEPHRINE HCL 10 MG/ML IJ SOLN
INTRAMUSCULAR | Status: DC | PRN
  Administered 2013-06-25: 80 ug via INTRAVENOUS
  Administered 2013-06-25: 120 ug via INTRAVENOUS

## 2013-06-25 MED ORDER — SUCCINYLCHOLINE CHLORIDE 20 MG/ML IJ SOLN
INTRAMUSCULAR | Status: AC
  Filled 2013-06-25: qty 1

## 2013-06-25 MED ORDER — PROPOFOL 10 MG/ML IV BOLUS
INTRAVENOUS | Status: DC | PRN
  Administered 2013-06-25: 160 mg via INTRAVENOUS

## 2013-06-25 MED ORDER — GLYCOPYRROLATE 0.2 MG/ML IJ SOLN
INTRAMUSCULAR | Status: AC
  Filled 2013-06-25: qty 2

## 2013-06-25 MED ORDER — ROCURONIUM BROMIDE 50 MG/5ML IV SOLN
INTRAVENOUS | Status: AC
  Filled 2013-06-25: qty 1

## 2013-06-25 MED ORDER — OXYCODONE HCL 5 MG PO TABS: 5.0000 mg | ORAL_TABLET | Freq: Once | ORAL | Status: DC | PRN

## 2013-06-25 MED ORDER — ONDANSETRON HCL 4 MG/2ML IJ SOLN: 4.0000 mg | Freq: Four times a day (QID) | INTRAMUSCULAR | Status: DC | PRN

## 2013-06-25 MED ORDER — POVIDONE-IODINE 10 % EX OINT
TOPICAL_OINTMENT | CUTANEOUS | Status: DC | PRN
  Administered 2013-06-25: 1 via TOPICAL

## 2013-06-25 MED ORDER — 0.9 % SODIUM CHLORIDE (POUR BTL) OPTIME
TOPICAL | Status: DC | PRN
  Administered 2013-06-25: 1000 mL

## 2013-06-25 MED ORDER — PHENYLEPHRINE 40 MCG/ML (10ML) SYRINGE FOR IV PUSH (FOR BLOOD PRESSURE SUPPORT)
PREFILLED_SYRINGE | INTRAVENOUS | Status: AC
  Filled 2013-06-25: qty 10

## 2013-06-25 MED ORDER — GLYCOPYRROLATE 0.2 MG/ML IJ SOLN
INTRAMUSCULAR | Status: AC
  Filled 2013-06-25: qty 1

## 2013-06-25 MED ORDER — POVIDONE-IODINE 10 % EX OINT
TOPICAL_OINTMENT | CUTANEOUS | Status: AC
  Filled 2013-06-25: qty 28.35

## 2013-06-25 MED ORDER — KCL IN DEXTROSE-NACL 20-5-0.45 MEQ/L-%-% IV SOLN
INTRAVENOUS | Status: DC
  Administered 2013-06-25: 08:00:00 via INTRAVENOUS
  Administered 2013-06-25: 75 mL/h via INTRAVENOUS
  Administered 2013-06-26 – 2013-07-10 (×20): via INTRAVENOUS
  Filled 2013-06-25 (×38): qty 1000

## 2013-06-25 MED ORDER — LIDOCAINE HCL (CARDIAC) 20 MG/ML IV SOLN
INTRAVENOUS | Status: AC
  Filled 2013-06-25: qty 5

## 2013-06-25 MED ORDER — LACTATED RINGERS IV SOLN
INTRAVENOUS | Status: DC | PRN
  Administered 2013-06-25: 09:00:00 via INTRAVENOUS

## 2013-06-25 MED ORDER — ONDANSETRON HCL 4 MG/2ML IJ SOLN
INTRAMUSCULAR | Status: DC | PRN
  Administered 2013-06-25: 4 mg via INTRAVENOUS

## 2013-06-25 MED ORDER — FENTANYL CITRATE 0.05 MG/ML IJ SOLN
INTRAMUSCULAR | Status: AC
  Filled 2013-06-25: qty 5

## 2013-06-25 MED ORDER — FENTANYL CITRATE 0.05 MG/ML IJ SOLN
INTRAMUSCULAR | Status: AC
  Filled 2013-06-25: qty 2

## 2013-06-25 MED ORDER — OXYCODONE HCL 5 MG/5ML PO SOLN: 5.0000 mg | Freq: Once | ORAL | Status: DC | PRN

## 2013-06-25 MED ORDER — PROPOFOL 10 MG/ML IV BOLUS
INTRAVENOUS | Status: AC
  Filled 2013-06-25: qty 20

## 2013-06-25 MED ORDER — NEOSTIGMINE METHYLSULFATE 1 MG/ML IJ SOLN
INTRAMUSCULAR | Status: DC | PRN
  Administered 2013-06-25: 4 mg via INTRAVENOUS

## 2013-06-25 MED ORDER — FENTANYL CITRATE 0.05 MG/ML IJ SOLN
25.0000 ug | INTRAMUSCULAR | Status: DC | PRN
  Administered 2013-06-25 (×2): 25 ug via INTRAVENOUS

## 2013-06-25 MED ORDER — LIDOCAINE HCL (CARDIAC) 20 MG/ML IV SOLN
INTRAVENOUS | Status: DC | PRN
  Administered 2013-06-25: 40 mg via INTRAVENOUS

## 2013-06-25 MED ORDER — FENTANYL CITRATE 0.05 MG/ML IJ SOLN
INTRAMUSCULAR | Status: DC | PRN
  Administered 2013-06-25 (×2): 25 ug via INTRAVENOUS
  Administered 2013-06-25 (×2): 50 ug via INTRAVENOUS
  Administered 2013-06-25 (×2): 25 ug via INTRAVENOUS

## 2013-06-25 MED ORDER — SUCCINYLCHOLINE CHLORIDE 20 MG/ML IJ SOLN
INTRAMUSCULAR | Status: DC | PRN
  Administered 2013-06-25: 100 mg via INTRAVENOUS

## 2013-06-25 MED ORDER — NEOSTIGMINE METHYLSULFATE 1 MG/ML IJ SOLN
INTRAMUSCULAR | Status: AC
  Filled 2013-06-25: qty 10

## 2013-06-25 MED ORDER — ROCURONIUM BROMIDE 100 MG/10ML IV SOLN
INTRAVENOUS | Status: DC | PRN
  Administered 2013-06-25: 10 mg via INTRAVENOUS

## 2013-06-25 SURGICAL SUPPLY — 43 items
CANISTER SUCTION 2500CC (MISCELLANEOUS) ×3 IMPLANT
CATH MALECOT BARD  24FR (CATHETERS) ×2
CATH MALECOT BARD 24FR (CATHETERS) ×1 IMPLANT
CATH ROBINSON RED A/P 16FR (CATHETERS) ×6 IMPLANT
CATH ROBINSON RED A/P 18FR (CATHETERS) ×3 IMPLANT
CHLORAPREP W/TINT 26ML (MISCELLANEOUS) ×3 IMPLANT
COVER SURGICAL LIGHT HANDLE (MISCELLANEOUS) ×3 IMPLANT
DRAPE LAPAROSCOPIC ABDOMINAL (DRAPES) ×3 IMPLANT
DRAPE UTILITY 15X26 W/TAPE STR (DRAPE) ×6 IMPLANT
DRSG COVADERM 4X8 (GAUZE/BANDAGES/DRESSINGS) ×3 IMPLANT
ELECT CAUTERY BLADE 6.4 (BLADE) ×3 IMPLANT
ELECT REM PT RETURN 9FT ADLT (ELECTROSURGICAL) ×3
ELECTRODE REM PT RTRN 9FT ADLT (ELECTROSURGICAL) ×1 IMPLANT
GLOVE BIO SURGEON STRL SZ7.5 (GLOVE) ×3 IMPLANT
GLOVE BIOGEL PI IND STRL 7.0 (GLOVE) ×2 IMPLANT
GLOVE BIOGEL PI IND STRL 8 (GLOVE) ×2 IMPLANT
GLOVE BIOGEL PI INDICATOR 7.0 (GLOVE) ×4
GLOVE BIOGEL PI INDICATOR 8 (GLOVE) ×4
GLOVE ECLIPSE 7.5 STRL STRAW (GLOVE) ×3 IMPLANT
GLOVE SURG SS PI 7.0 STRL IVOR (GLOVE) ×3 IMPLANT
GOWN STRL NON-REIN LRG LVL3 (GOWN DISPOSABLE) ×15 IMPLANT
KIT BASIN OR (CUSTOM PROCEDURE TRAY) ×3 IMPLANT
KIT ROOM TURNOVER OR (KITS) ×3 IMPLANT
NS IRRIG 1000ML POUR BTL (IV SOLUTION) ×3 IMPLANT
PACK GENERAL/GYN (CUSTOM PROCEDURE TRAY) ×3 IMPLANT
PAD ARMBOARD 7.5X6 YLW CONV (MISCELLANEOUS) ×3 IMPLANT
PLUG CATH AND CAP STER (CATHETERS) IMPLANT
SPONGE GAUZE 4X4 12PLY (GAUZE/BANDAGES/DRESSINGS) ×3 IMPLANT
SPONGE GAUZE 4X4 12PLY STER LF (GAUZE/BANDAGES/DRESSINGS) ×3 IMPLANT
STAPLER VISISTAT 35W (STAPLE) ×3 IMPLANT
SUT ETHILON 2 0 FS 18 (SUTURE) ×3 IMPLANT
SUT NOVA NAB DX-16 0-1 5-0 T12 (SUTURE) ×9 IMPLANT
SUT PDS AB 1 TP1 96 (SUTURE) ×6 IMPLANT
SUT SILK 2 0 SH (SUTURE) ×12 IMPLANT
SUT SILK 3 0 (SUTURE) ×2
SUT SILK 3 0 SH CR/8 (SUTURE) ×6 IMPLANT
SUT SILK 3-0 18XBRD TIE 12 (SUTURE) ×1 IMPLANT
SUT VIC AB 1 CT1 18XCR BRD 8 (SUTURE) ×1 IMPLANT
SUT VIC AB 1 CT1 8-18 (SUTURE) ×2
SYRINGE TOOMEY DISP (SYRINGE) ×3 IMPLANT
TAPE CLOTH SURG 6X10 WHT LF (GAUZE/BANDAGES/DRESSINGS) ×3 IMPLANT
TOWEL OR 17X24 6PK STRL BLUE (TOWEL DISPOSABLE) ×3 IMPLANT
TOWEL OR 17X26 10 PK STRL BLUE (TOWEL DISPOSABLE) ×3 IMPLANT

## 2013-06-25 NOTE — Progress Notes (Signed)
Patient ID: Joan [name], female   DOB: [birth year], 53 y.o.   MRN: [id number]  Subjective: Doesn't recall me speaking with her yesterday regarding feeding tube placement.    Objective:  Vital signs:  Filed Vitals:   06/24/13 1934 06/24/13 2330 06/25/13 0330 06/25/13 0735  BP: 129/80 135/74 144/91   Pulse: 116 111 122   Temp: 98 F (36.7 C) 98.3 F (36.8 C) 98.7 F (37.1 C) 98.7 F (37.1 C)  TempSrc: Oral Oral Oral Oral  Resp: 24 12 25    Height:      Weight:   162 lb 7.7 oz (73.7 kg)   SpO2: 100% 100% 100%     Last BM Date: 06/24/13  Intake/Output   Yesterday:  01/26 0701 - 01/27 0700 In: 2862.5 [P.O.:600; I.V.:2000; IV Piggyback:262.5] Out: -  This shift:     Physical Exam:  General: Pt awake/alert and in no acute distressn Chest: cta. No chest wall pain w good excursion CV:  s1s2 rrr, tachycardic.  No murmurs, gallops or rubs.  No edema. MS: Normal AROM mjr joints.  No obvious deformity Abdomen: Soft.  Nondistended.   Problem List:   Principal Problem:   Acute encephalopathy Active Problems:   Convulsions/seizures   Toxic metabolic encephalopathy   Psychosis    Results:   Labs: Results for orders placed during the hospital encounter of 06/19/13 (from the past 48 hour(s))  GLUCOSE, CAPILLARY     Status: Abnormal   Collection Time    06/23/13  7:54 AM      Result Value Range   Glucose-Capillary 124 (*) 70 - 99 mg/dL  CULTURE, BLOOD (ROUTINE X 2)     Status: None   Collection Time    06/23/13  2:02 PM      Result Value Range   Specimen Description BLOOD LEFT ARM     Special Requests BOTTLES DRAWN AEROBIC ONLY 10CC     Culture  Setup Time       Value: 06/23/2013 19:55     Performed at [facility]   Culture       Value:        BLOOD CULTURE RECEIVED NO GROWTH TO DATE CULTURE WILL BE HELD FOR 5 DAYS BEFORE ISSUING A FINAL NEGATIVE REPORT     Performed at [facility]   Report Status PENDING    CULTURE, BLOOD (ROUTINE X 2)      Status: None   Collection Time    06/23/13  2:14 PM      Result Value Range   Specimen Description BLOOD LEFT HAND     Special Requests BOTTLES DRAWN AEROBIC ONLY 5.5 CC     Culture  Setup Time       Value: 06/23/2013 19:55     Performed at [facility]   Culture       Value:        BLOOD CULTURE RECEIVED NO GROWTH TO DATE CULTURE WILL BE HELD FOR 5 DAYS BEFORE ISSUING A FINAL NEGATIVE REPORT     Performed at [facility]   Report Status PENDING    OCCULT BLOOD X 1 CARD TO LAB, STOOL     Status: None   Collection Time    06/23/13  5:08 PM      Result Value Range   Fecal Occult Bld NEGATIVE  NEGATIVE  CLOSTRIDIUM DIFFICILE BY PCR     Status: None   Collection Time    06/23/13  5:08 PM  Result Value Range   C difficile by pcr NEGATIVE  NEGATIVE  URINE CULTURE     Status: None   Collection Time    06/23/13  6:15 PM      Result Value Range   Specimen Description URINE, CLEAN CATCH     Special Requests NONE     Culture  Setup Time       Value: 06/24/2013 01:22     Performed at [facility] Count       Value: NO GROWTH     Performed at [facility]   Culture       Value: NO GROWTH     Performed at [facility]   Report Status 06/24/2013 FINAL    URINALYSIS, ROUTINE W REFLEX MICROSCOPIC     Status: Abnormal   Collection Time    06/23/13  6:15 PM      Result Value Range   Color, Urine AMBER (*) YELLOW   Comment: BIOCHEMICALS MAY BE AFFECTED BY COLOR   APPearance CLEAR  CLEAR   Specific Gravity, Urine 1.035 (*) 1.005 - 1.030   pH 5.5  5.0 - 8.0   Glucose, UA NEGATIVE  NEGATIVE mg/dL   Hgb urine dipstick NEGATIVE  NEGATIVE   Bilirubin Urine NEGATIVE  NEGATIVE   Ketones, ur NEGATIVE  NEGATIVE mg/dL   Protein, ur NEGATIVE  NEGATIVE mg/dL   Urobilinogen, UA 1.0  0.0 - 1.0 mg/dL   Nitrite NEGATIVE  NEGATIVE   Leukocytes, UA NEGATIVE  NEGATIVE   Comment: MICROSCOPIC NOT DONE ON URINES WITH NEGATIVE PROTEIN, BLOOD,  LEUKOCYTES, NITRITE, OR GLUCOSE <1000 mg/dL.  BASIC METABOLIC PANEL     Status: Abnormal   Collection Time    06/24/13  5:03 AM      Result Value Range   Sodium 149 (*) 137 - 147 mEq/L   Potassium 2.8 (*) 3.7 - 5.3 mEq/L   Comment: CRITICAL RESULT CALLED TO, READ BACK BY AND VERIFIED WITH:     T IRBY,RN 06/24/13 0550 RHOLMES   Chloride 117 (*) 96 - 112 mEq/L   CO2 19  19 - 32 mEq/L   Glucose, Bld 95  70 - 99 mg/dL   BUN 15  6 - 23 mg/dL   Creatinine, Ser 0.63  0.50 - 1.10 mg/dL   Calcium 8.1 (*) 8.4 - 10.5 mg/dL   GFR calc non Af Amer >90  >90 mL/min   GFR calc Af Amer >90  >90 mL/min   Comment: (NOTE)     The eGFR has been calculated using the CKD EPI equation.     This calculation has not been validated in all clinical situations.     eGFR's persistently <90 mL/min signify possible Chronic Kidney     Disease.  CBC     Status: Abnormal   Collection Time    06/24/13  5:03 AM      Result Value Range   WBC 4.2  4.0 - 10.5 K/uL   RBC 2.73 (*) 3.87 - 5.11 MIL/uL   Hemoglobin 8.6 (*) 12.0 - 15.0 g/dL   HCT 25.9 (*) 36.0 - 46.0 %   MCV 94.9  78.0 - 100.0 fL   MCH 31.5  26.0 - 34.0 pg   MCHC 33.2  30.0 - 36.0 g/dL   RDW 17.9 (*) 11.5 - 15.5 %   Platelets 59 (*) 150 - 400 K/uL   Comment: CONSISTENT WITH PREVIOUS RESULT  GLUCOSE, CAPILLARY  Status: None   Collection Time    06/24/13  8:04 AM      Result Value Range   Glucose-Capillary 93  70 - 99 mg/dL  PREALBUMIN     Status: Abnormal   Collection Time    06/24/13  4:30 PM      Result Value Range   Prealbumin 11.7 (*) 17.0 - 34.0 mg/dL   Comment: Performed at [facility]     Status: Abnormal   Collection Time    06/25/13  3:30 AM      Result Value Range   Sodium 150 (*) 137 - 147 mEq/L   Potassium 3.2 (*) 3.7 - 5.3 mEq/L   Chloride 122 (*) 96 - 112 mEq/L   CO2 17 (*) 19 - 32 mEq/L   Glucose, Bld 90  70 - 99 mg/dL   BUN 9  6 - 23 mg/dL   Creatinine, Ser 0.60  0.50 - 1.10 mg/dL    Calcium 7.9 (*) 8.4 - 10.5 mg/dL   GFR calc non Af Amer >90  >90 mL/min   GFR calc Af Amer >90  >90 mL/min   Comment: (NOTE)     The eGFR has been calculated using the CKD EPI equation.     This calculation has not been validated in all clinical situations.     eGFR's persistently <90 mL/min signify possible Chronic Kidney     Disease.  CBC     Status: Abnormal   Collection Time    06/25/13  3:30 AM      Result Value Range   WBC 3.8 (*) 4.0 - 10.5 K/uL   RBC 2.45 (*) 3.87 - 5.11 MIL/uL   Hemoglobin 7.9 (*) 12.0 - 15.0 g/dL   HCT 23.4 (*) 36.0 - 46.0 %   MCV 95.5  78.0 - 100.0 fL   MCH 32.2  26.0 - 34.0 pg   MCHC 33.8  30.0 - 36.0 g/dL   RDW 17.8 (*) 11.5 - 15.5 %   Platelets 46 (*) 150 - 400 K/uL   Comment: CONSISTENT WITH PREVIOUS RESULT    Imaging / Studies: No results found.  Medications / Allergies: per chart  Antibiotics: Anti-infectives   Start     Dose/Rate Route Frequency Ordered Stop   06/23/13 1400  piperacillin-tazobactam (ZOSYN) IVPB 3.375 g     3.375 g 12.5 mL/hr over 240 Minutes Intravenous 3 times per day 06/23/13 1336     06/20/13 1200  vancomycin (VANCOCIN) IVPB 1000 mg/200 mL premix     1,000 mg 200 mL/hr over 60 Minutes Intravenous  Once 06/20/13 1146 06/20/13 1433      Assessment/Plan Acute encephalopathy(autoimmune) Tachycardia Hx seizures  Hypertension  Hx Gastric Bypass 2010, revision with partial gastrectomy in 2012  Protein calorie malnutrition  Proceed with open J tube placement this AM.  I once again had a lengthy discussion with her son [name].  We discussed in detail the benefits and risks including bleeding, infection, fistula, obstruction, prolonged intubation, death.  He wishes to proceed.  Verbal consent obtained per hospital protocol.  The patient is NPO, not on anticoagulation.  Zosyn on board.    Erby Pian, [facility] Surgery Pager [phone] Office [phone]  06/25/2013 7:51 AM

## 2013-06-25 NOTE — Progress Notes (Addendum)
Subjective: Even with a lot of encouragement yesterday, still did not eat.    Exam: Filed Vitals:   06/25/13 0735  BP:   Pulse:   Temp: 98.7 F (37.1 C)  Resp:    Gen: In bed, NAD MS: Tearfull this morning.  ZO:XWRUECN:PERRL, EOMI, VFF Motor: She moves all extremities well, thoguh does not cooperate well for formal testing. I still wonder about a mild right paresis, but this is not clear.   Sensory:intact to LT   Impression: 53 year old female with altered mental status that is persistent. I continue to think she was in status epilepticus for multiple days at presentation of the previous hospitalization.Given her continued hallucinations, and other concerning factors, autoimmune encephalitis is certainly a consideration. Her CSF panel was negative for this. She has been admitted for steroids + plasma exchange, though I would not proceed further than this even if she does nto clearly improve.   She intiially presented in mid December with 2-3 days unresponsiveness. She had been diagnosed previously with catatonia, and therefore this was thought to be catatonia. Upon history of these events, however, she was unresponsive for several hours and then became immediately awake following ativan administration. 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 Seh was liekly in status for 4 -5 days with the episode in December. She has since had episodes concerning for seizure and is on two anti-epileptics with no further episodes. She continued to have AMS and hallucinations at the nursing home.  I feel that autoimmune encephalitis is a possibility and while labs are pending she has been admitted for empiric steroids and PLEX.  No signs of malignancy on CT chest or TV ultrasound or previous CT abd/pelvis.    Recommendations: 1) PLEX QOD, treatment 3/5 yesterday  2) I think that she does have a chance for continued improvement. In this setting, it may be  reasonable to obtain enteral access to ensure adequate nutrition over the coming weeks as she is improving.  3) will continue to follow.   Ritta SlotMcNeill Kirkpatrick, MD [facility] [phone]  If 7pm- 7am, please page neurology on call at [phone].

## 2013-06-25 NOTE — Preoperative (Signed)
Beta Blockers   Reason not to administer Beta Blockers:Not Applicable, pt took lopressor 1/26 @2156 

## 2013-06-25 NOTE — Transfer of Care (Signed)
Immediate Anesthesia Transfer of Care Note  Patient: Joan [name]  Procedure(s) Performed: Procedure(s):  OPEN JEJUNOSTOMY FEEDING TUBE  (N/A)  Patient Location: PACU  Anesthesia Type:General  Level of Consciousness: awake, alert  and oriented  Airway & Oxygen Therapy: Patient Spontanous Breathing and Patient connected to nasal cannula oxygen  Post-op Assessment: Report given to PACU RN, Post -op Vital signs reviewed and stable and Patient moving all extremities X 4  Post vital signs: Reviewed and stable  Complications: No apparent anesthesia complications

## 2013-06-25 NOTE — Progress Notes (Signed)
Potassium infusion d/c'd,  IV- via PICC paused & then drew off waste, then captured specimen for T& S, sent to Blood Bank,  LR connected to red lumen on PICC line, infusing well.

## 2013-06-25 NOTE — Progress Notes (Signed)
Patient needs an open jejunostomy feeding tube.  Will plan for today.  Joan LamasJames O. Gae BonWyatt, [name], MD, FACS [phone]--pager [phone]--office [facility]

## 2013-06-25 NOTE — Anesthesia Postprocedure Evaluation (Signed)
Anesthesia Post Note  Patient: Joan [name]  Procedure(s) Performed: Procedure(s) (LRB):  OPEN JEJUNOSTOMY FEEDING TUBE  (N/A)  Anesthesia type: General  Patient location: PACU  Post pain: Pain level controlled and Adequate analgesia  Post assessment: Post-op Vital signs reviewed, Patient's Cardiovascular Status Stable, Respiratory Function Stable, Patent Airway and Pain level controlled  Last Vitals:  Filed Vitals:   06/25/13 1228  BP: 150/83  Pulse: 118  Temp: 36.6 C  Resp: 19    Post vital signs: Reviewed and stable  Level of consciousness: awake, alert  and oriented  Complications: No apparent anesthesia complications

## 2013-06-25 NOTE — Progress Notes (Addendum)
Physician notified: Anette RiedelEmina, NP At: 1718   Regarding: OK for tube feed to start tonight? Thought Lindie SpruceWyatt stated to start in AM. Did pt receive one unit PRBC and one unit FFP in OR?  Awaiting return response.   Returned Response at: 1721  Order(s): Wait 24 hours post op to start tube feedings. Check with anesthesia for infusions during OR.   RN spoke with anesthesia. Only could verify plasma given, NO PRBC given in procedure.

## 2013-06-25 NOTE — Progress Notes (Signed)
[facility] PROGRESS NOTE  Joan [name] Joan [name] DOB: 07/30/1960 DOA: 06/19/2013 PCP: Kimber RelicGREEN, ARTHUR G, MD  HPI/Subjective: 53 yr old female presents with a CC of Altered mental status. Pt has a PMH of peripheral neuropathy, seizures, catatonia, anxiety, HTN, and fibromyalgia. Pt was previously admitted on 12/13 thru 12/28 with toxic metabolic encephalopathy.  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  EEG showed no recurrent seizures. A 24 hour EEG  showed an intermittent delta pattern felt to represent Frontal Intermittent Rhythmic Delta Activity. Pt was started on two antiepileptics.  This admission, she was again found altered and seemed "comatose" . Since d/c in December, pt has had persistent confusion and [facility] She is currently confused and experiencing visual hallucinations.  Subjective: Had her J-tube earlier today, done by general surgery. Patient still has some confusion and lethargy.   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 -Pt received diatech catheter placement and plasmapheresis on 06/20/13.   -Plan is for plasmapheresis every other day and Solumedrol 500 mg bid x 3 days. D/C on 1/24. -MRI of the brain showed no acute abnormality.  Detailed results listed below. -Ammonia level is normal - CT chest and transvaginal ultrasound showed no abnormalities. -[facility] paraneoplastic panel reported to be negative, MRI of the C-spine is negative for acute findings  Pending:   - serum NMDA.   Tachycardia -Check 12-lead EKG, patient is on atenolol 12.5 not sure this is therapeutic dose. -Metoprolol increased to 50 mg PO bid, 12-lead EKG showed sinus tachycardia. -If patient continues to have tachycardia cardiology will be consulted.  Hx of Convulsions/Seizures  -Pt has a PMH of seizures and catatonia.   -MRI of the brain showed  thalamic signal abnormality during previous admission.  No longer present on current MRI. -Pt currently taking Keppra and Depakene.  Increased Depakene to 500mg  tid -Valproic acid lvl 35.1 was low.  Re-check levels in the AM  Hx of Anxiety  -Continue pt on Remeron -Patient appears to have had a psychiatric history  -Current illness is bringing out psychiatric symptoms. -Psychiatry consulted as psychotropic medications may help with her current symptoms. -Psych prescribed Risperdal .5 mg bid.  Hypertension  -Pt has a PMH of HTN and most recent BP reading was 151/108 -Likely secondary to anxiety and high dose steroids. -Continue atenolol and hydralazine PRN  Malnutrition with history of Gastric Bypass -Per SNF patient has had no PO intake for over 1 week prior to admission. -SNF sent her to hospital for J tube placement. -On my exam patient appears to drink if it is held to her mouth. -Nutrition consulted. -Speech consulted. -Will check pre-albumin to determine if patient may need TNA. -Gen Surg consulted, J-tube placed today.  Folate deficiency -Replete parenterally.  Recent hx of c-diff (05/18/13)  -Pt negative for C-diff at this admit  Thrombocytopenia -Discontinue heparin, seems to be chronic up and down, could be secondary to folate deficiency.  Enterovaginal fistula -Reported prior to that this patient had some stools in her vagina. -Discussed with radiology, no evidence of rectovaginal fistula and the previous imaging, (patient had indicated pelvic scan done previously).  DVT Prophylaxis:  IV Heparin  Code Status: Full Code Family Communication: Son was contacted to explain procedures and status Disposition Plan: D/C to SNF when medically appropriate.   Consultants:  Psych  Neuro  Procedures:  Plasmapheresis  PICC Line Placement  Antibiotics:  None  Objective: Filed Vitals:   06/25/13 1230 06/25/13 1251 06/25/13 1458 06/25/13 1706  BP:  137/83   143/77  Pulse: 121 123  146  Temp:   98.2 F (36.8 C)   TempSrc:   Oral   Resp: 20 24  22   Height:      Weight:      SpO2: 100% 100%  100%    Intake/Output Summary (Last 24 hours) at 06/25/13 1851 Last data filed at 06/25/13 1757  Gross per 24 hour  Intake 2885.5 ml  Output     75 ml  Net 2810.5 ml   Filed Weights   06/24/13 0741 06/24/13 1304 06/25/13 0330  Weight: 73.9 kg (162 lb 14.7 oz) 73.1 kg (161 lb 2.5 oz) 73.7 kg (162 lb 7.7 oz)    Exam: General: awake and alert, frightful, appears mild distress, rambling HEENT: PERRLA, EOMI, Anicteic Sclera,  Neck: Supple, no JVD, no masses, no lymphadenopathy  Cardiovascular:  tachycardic rhythm, S1 S2 auscultated, no rubs, murmurs or gallops.  Respiratory: Clear to auscultation bilaterally with equal chest rise  Abdomen: Soft, nontender, nondistended, + bowel sounds  Extremities: No swelling, or cyanosis. Pain in the legs elicited  Neuro: Diminished strength equally in all 4 ext.  Skin: No scars or scratches observed  Psych: Abnormal affect, pt appears frightened, rambling, but will follow commands.     Data Reviewed: Basic Metabolic Panel:  Recent Labs Lab 06/20/13 0515  06/22/13 0500 06/22/13 0916 06/23/13 0455 06/24/13 0503 06/25/13 0330  NA 141  < > 143 145 145 149* 150*  K 4.2  < > 4.0 3.9 3.9 2.8* 3.2*  CL 106  < > 109 107 114* 117* 122*  CO2 20  --  21  --  19 19 17*  GLUCOSE 112*  < > 115* 110* 121* 95 90  BUN 6  < > 10 10 12 15 9   CREATININE 0.69  < > 0.66 0.80 0.60 0.63 0.60  CALCIUM 8.6  --  8.8  --  8.8 8.1* 7.9*  < > = values in this interval not displayed. Liver Function Tests:  Recent Labs Lab 06/19/13 1925 06/20/13 0515  AST 23 25  ALT 9 9  ALKPHOS 35* 38*  BILITOT 0.4 0.4  PROT 5.9* 6.3  ALBUMIN 2.6* 2.7*    Recent Labs Lab 06/19/13 1925  AMMONIA 24   CBC:  Recent Labs Lab 06/19/13 1925 06/20/13 0515  06/22/13 0500 06/22/13 0916 06/23/13 0455 06/24/13 0503  06/25/13 0330  WBC 5.0 2.3*  --  6.1  --  4.0 4.2 3.8*  NEUTROABS 2.8  --   --   --   --   --   --   --   HGB 11.2* 12.0  < > 10.6* 10.9* 10.2* 8.6* 7.9*  HCT 34.8* 36.3  < > 31.9* 32.0* 30.6* 25.9* 23.4*  MCV 96.9 94.8  --  93.8  --  94.2 94.9 95.5  PLT 113* 129*  --  101*  --  72* 59* 46*  < > = values in this interval not displayed. CBG:  Recent Labs Lab 06/21/13 0800 06/22/13 0745 06/23/13 0754 06/24/13 0804 06/25/13 0734  GLUCAP 109* 118* 124* 93 94    Recent Results (from the past 240 hour(s))  CLOSTRIDIUM DIFFICILE BY PCR     Status: None   Collection Time    06/19/13  5:14 PM      Result Value Range Status   C difficile by pcr  NEGATIVE  NEGATIVE Final  MRSA PCR SCREENING     Status: None   Collection Time    06/19/13  5:14 PM      Result Value Range Status   MRSA by PCR NEGATIVE  NEGATIVE Final   Comment:            The GeneXpert MRSA Assay (FDA     approved for NASAL specimens     only), is one component of a     comprehensive MRSA colonization     surveillance program. It is not     intended to diagnose MRSA     infection nor to guide or     monitor treatment for     MRSA infections.  STOOL CULTURE     Status: None   Collection Time    06/19/13  5:14 PM      Result Value Range Status   Specimen Description STOOL   Final   Special Requests NONE   Final   Culture     Final   Value: NO SALMONELLA, SHIGELLA, CAMPYLOBACTER, YERSINIA, OR E.COLI 0157:H7 ISOLATED     Performed at [facility]   Report Status 06/23/2013 FINAL   Final  CULTURE, BLOOD (ROUTINE X 2)     Status: None   Collection Time    06/23/13  2:02 PM      Result Value Range Status   Specimen Description BLOOD LEFT ARM   Final   Special Requests BOTTLES DRAWN AEROBIC ONLY 10CC   Final   Culture  Setup Time     Final   Value: 06/23/2013 19:55     Performed at [facility]   Culture     Final   Value:        BLOOD CULTURE RECEIVED NO GROWTH TO DATE CULTURE WILL BE HELD FOR  5 DAYS BEFORE ISSUING A FINAL NEGATIVE REPORT     Performed at [facility]   Report Status PENDING   Incomplete  CULTURE, BLOOD (ROUTINE X 2)     Status: None   Collection Time    06/23/13  2:14 PM      Result Value Range Status   Specimen Description BLOOD LEFT HAND   Final   Special Requests BOTTLES DRAWN AEROBIC ONLY 5.5 CC   Final   Culture  Setup Time     Final   Value: 06/23/2013 19:55     Performed at [facility]   Culture     Final   Value:        BLOOD CULTURE RECEIVED NO GROWTH TO DATE CULTURE WILL BE HELD FOR 5 DAYS BEFORE ISSUING A FINAL NEGATIVE REPORT     Performed at [facility]   Report Status PENDING   Incomplete  CLOSTRIDIUM DIFFICILE BY PCR     Status: None   Collection Time    06/23/13  5:08 PM      Result Value Range Status   C difficile by pcr NEGATIVE  NEGATIVE Final  URINE CULTURE     Status: None   Collection Time    06/23/13  6:15 PM      Result Value Range Status   Specimen Description URINE, CLEAN CATCH   Final   Special Requests NONE   Final   Culture  Setup Time     Final   Value: 06/24/2013 01:22     Performed at [facility] Count  Final   Value: NO GROWTH     Performed at [facility]   Culture     Final   Value: NO GROWTH     Performed at [facility]   Report Status 06/24/2013 FINAL   Final     Studies: No results found.  Scheduled Meds: . divalproex  500 mg Oral Q8H  . fentaNYL      . levETIRAcetam  1,000 mg Oral BID  . metoprolol tartrate  50 mg Oral BID  . mirtazapine  30 mg Oral QHS  . multivitamin with minerals  1 tablet Oral Daily  . piperacillin-tazobactam (ZOSYN)  IV  3.375 g Intravenous Q8H  . risperiDONE  0.5 mg Oral BID  . sodium chloride  3 mL Intravenous Q12H  . thiamine  100 mg Oral Daily   Continuous Infusions: . citrate dextrose    . dextrose 5 % and 0.45 % NaCl with KCl 20 mEq/L 75 mL/hr (06/25/13 1524)  . feeding supplement (OSMOLITE 1.5 CAL)       Principal Problem:   Acute encephalopathy Active Problems:   Convulsions/seizures   Toxic metabolic encephalopathy   Psychosis    Anber Mckiver A [facility] Pager [phone]. If 7PM-7AM, please contact night-coverage at [email or web address], password [facility] 06/25/2013, 6:51 PM  LOS: 6 days

## 2013-06-25 NOTE — Op Note (Signed)
OPERATIVE REPORT  DATE OF OPERATION: 06/19/2013 - 06/25/2013  PATIENT:  Joan [name]  53 y.o. female  PRE-OPERATIVE DIAGNOSIS:  malnutrition  POST-OPERATIVE DIAGNOSIS:  malnutrition  PROCEDURE:  Procedure(s):  OPEN JEJUNOSTOMY FEEDING TUBE   SURGEON:  Surgeon(s): Cherylynn RidgesJames O Morgan Keinath, MD  ASSISTANT: Marlyne BeardsJennings, PA-C  ANESTHESIA:   general  EBL: <50 ml  BLOOD ADMINISTERED: none  DRAINS: Jejunostomy Tube   SPECIMEN:  No Specimen  COUNTS CORRECT:  YES  PROCEDURE DETAILS: The patient was taken to the operating room and placed on the table in the supine position. After an adequate general endotracheal anesthetic was administered she was prepped and draped in the usual sterile manner exposing her abdomen.  After a proper time out was performed identifying the patient and the procedure to be performed, a 8-10 cm midline incision was made in the upper abdomen using a #10 blade and taken down to the midline fascia. We incised through the midline fascia using electrocautery and sharply dissected down into the peritoneum using Metzenbaum scissors. There was some ascites in the peritoneal cavity but not a significant amount.  We mobilized the small bowel and identified the proximal jejunojejunostomy and the Roux-en-Y limbs. We were able to find the distal small bowel. Approximately 10 cm from the jejunojejunostomy a pursestring suture of 3-0 silk was made on the antimesenteric wall of the small bowel. It was at that site that an enterotomy was made using electrocautery and the 16 French red Robinson catheter which had been fenestrated by the surgeon was passed distally into the small bowel. A subsequent Witzel type serosal suture was placed up to the connecting hub that we then brought out the jejunostomy feeding tube in the left upper portion of the abdomen.  We then secured it in place with a 2-0 nylon suture. He was attached the tube internally to the anterior abdominal wall using interrupted 3-0 silk  sutures.  The patient didn't lose much blood throughout the procedure but there was no overt significant bleeding. We irrigated with saline solution and then subsequently closed the midline fascia using interrupted #1 Novafil sutures. All needle counts, sponge counts comes and instrument counts were correct. We irrigated the subcutaneous tissue using saline solution.  We then closed the skin using stainless steel staples. A sterile dressing was applied.  PATIENT DISPOSITION:  PACU - hemodynamically stable.   Cherylynn RidgesWYATT, Omero Kowal O 1/27/201511:00 AM

## 2013-06-25 NOTE — Anesthesia Preprocedure Evaluation (Addendum)
Anesthesia Evaluation  Patient identified by MRN, date of birth, ID band Patient awake    Reviewed: Allergy & Precautions, H&P , NPO status , Patient's Chart, lab work & pertinent test results, reviewed documented beta blocker date and time   Airway Mallampati: II  Neck ROM: full    Dental  (+) Dental Advisory Given, Partial Lower, Partial Upper and Poor Dentition   Pulmonary          Cardiovascular hypertension, Pt. on home beta blockers     Neuro/Psych Seizures -,  PSYCHIATRIC DISORDERS Anxiety Admitted with metabolic encephalopathy  Neuromuscular disease    GI/Hepatic   Endo/Other    Renal/GU Renal disease     Musculoskeletal  (+) Fibromyalgia -  Abdominal   Peds  Hematology  (+) anemia ,   Anesthesia Other Findings   Reproductive/Obstetrics                         Anesthesia Physical Anesthesia Plan  ASA: II  Anesthesia Plan: General   Post-op Pain Management:    Induction: Intravenous  Airway Management Planned: Oral ETT  Additional Equipment:   Intra-op Plan:   Post-operative Plan: Extubation in OR  Informed Consent: I have reviewed the patients History and Physical, chart, labs and discussed the procedure including the risks, benefits and alternatives for the proposed anesthesia with the patient or authorized representative who has indicated his/her understanding and acceptance.     Plan Discussed with: CRNA, Anesthesiologist and Surgeon  Anesthesia Plan Comments:         Anesthesia Quick Evaluation

## 2013-06-25 NOTE — Progress Notes (Signed)
NUTRITION FOLLOW-UP  DOCUMENTATION CODES Per approved criteria  -Not Applicable   INTERVENTION: Per surgery, initiate Osmolite 1.5 at 10 ml/hr. If tolerating tomorrow, may advance by 5 ml q 4 hours, to goal of 45 ml/hr. Goal rate will provide: 1620 kcal, 68 grams protein, 823 ml free water. *Joan [name] would benefit from slow advancement and close monitoring of electrolytes given refeeding syndrome risk.* Monitor magnesium, potassium, and phosphorus daily for at least 3 days, MD to replete as needed, as Joan [name] is at risk for refeeding syndrome given prolonged poor oral intake. Discussed with Dr. Arthor Captain. RD to continue to follow nutrition care plan.  NUTRITION DIAGNOSIS: Inadequate oral intake related to AMS as evidenced by limited oral intake.   Goal: Intake to meet >90% of estimated nutrition needs.  Monitor:  weight trends, lab trends, I/O's, TF tolerance  ASSESSMENT: PMHx significant for catatonia, HTN, anxiety, gastric bypass. From SNF.  Recent admission from 12/13 - 12/28 with toxic metabolic encephalopathy, hospital course was complicated with pyelonephritis and C. Difficile colitis. Joan [name] was scheduled for PEG on 1/20, was found to be agitated and confused and was sent to ED.   Neuro saw Joan [name] on 1/21 and recommended plasma exchanges every other day as well as suppressive steroids to reduce her seizure foci.  Per CSW note from [facility], SW at [facility] reported that patient has not eaten since she was admitted to the facility. (PA contacted SNF and they confirmed that Joan [name] has had NO intake for >1 week PTA.)  SW stated that the facility tried to get a PEG, but Joan [name] has a hx of gastric bypass, and SW was wondering if Joan [name] could get a J-tube during this admission.   Joan [name] developed frequent amount of large stooling from vagina on 1/25 and was sent to SDU.   Underwent J-tube placement this morning. Per consult from surgery PA-C, Mr. Marlyne Beards, "You can start TF at 10 ml/hr today. Tomorrow we can titrate the rate  up at 5 ml every 4 hours till she is at required TF rate."  Potassium remains low at 3.2. Prealbumin is low at 15.8.  Height: Ht Readings from Last 1 Encounters:  06/20/13 5\' 3"  (1.6 m)    Weight: Wt Readings from Last 1 Encounters:  06/25/13 162 lb 7.7 oz (73.7 kg)  Admit wt 152 lb  BMI:  Body mass index is 28.79 kg/(m^2). Overweight  Estimated Nutritional Needs: Kcal: 1600 - 1800 Protein: 75 - 90 g Fluid: 1.8 - 2 liters  Skin: intact  Diet Order: NPO   EDUCATION NEEDS: -No education needs identified at this time   Intake/Output Summary (Last 24 hours) at 06/25/13 1449 Last data filed at 06/25/13 1048  Gross per 24 hour  Intake 2805.5 ml  Output     75 ml  Net 2730.5 ml    Last BM: 1/26  Labs:   Recent Labs Lab 06/23/13 0455 06/24/13 0503 06/25/13 0330  NA 145 149* 150*  K 3.9 2.8* 3.2*  CL 114* 117* 122*  CO2 19 19 17*  BUN 12 15 9   CREATININE 0.60 0.63 0.60  CALCIUM 8.8 8.1* 7.9*  GLUCOSE 121* 95 90    CBG (last 3)   Recent Labs  06/23/13 0754 06/24/13 0804 06/25/13 0734  GLUCAP 124* 93 94   Prealbumin  Date/Time Value Range Status  06/24/2013  4:30 PM 11.7* 17.0 - 34.0 mg/dL Final     Performed at [facility]    Scheduled Meds: . divalproex  500  mg Oral Q8H  . feeding supplement (RESOURCE BREEZE)  1 Container Oral TID BM  . fentaNYL      . levETIRAcetam  1,000 mg Oral BID  . metoprolol tartrate  50 mg Oral BID  . mirtazapine  30 mg Oral QHS  . multivitamin with minerals  1 tablet Oral Daily  . piperacillin-tazobactam (ZOSYN)  IV  3.375 g Intravenous Q8H  . risperiDONE  0.5 mg Oral BID  . sodium chloride  3 mL Intravenous Q12H  . thiamine  100 mg Oral Daily    Continuous Infusions: . citrate dextrose    . dextrose 5 % and 0.45 % NaCl with KCl 20 mEq/L 75 mL/hr at 06/25/13 0801    Jarold MottoSamantha Tad Fancher MS, RD, LDN Pager: [phone] After-hours pager: [phone]

## 2013-06-26 LAB — GLUCOSE, CAPILLARY
GLUCOSE-CAPILLARY: 111 mg/dL — AB (ref 70–99)
Glucose-Capillary: 108 mg/dL — ABNORMAL HIGH (ref 70–99)
Glucose-Capillary: 119 mg/dL — ABNORMAL HIGH (ref 70–99)

## 2013-06-26 LAB — CBC
HCT: 22.3 % — ABNORMAL LOW (ref 36.0–46.0)
Hemoglobin: 7.5 g/dL — ABNORMAL LOW (ref 12.0–15.0)
MCH: 31.8 pg (ref 26.0–34.0)
MCHC: 33.6 g/dL (ref 30.0–36.0)
MCV: 94.5 fL (ref 78.0–100.0)
PLATELETS: 72 10*3/uL — AB (ref 150–400)
RBC: 2.36 MIL/uL — AB (ref 3.87–5.11)
RDW: 17.2 % — AB (ref 11.5–15.5)
WBC: 6.1 10*3/uL (ref 4.0–10.5)

## 2013-06-26 LAB — BASIC METABOLIC PANEL
BUN: 4 mg/dL — ABNORMAL LOW (ref 6–23)
CO2: 20 meq/L (ref 19–32)
CREATININE: 0.58 mg/dL (ref 0.50–1.10)
Calcium: 8.1 mg/dL — ABNORMAL LOW (ref 8.4–10.5)
Chloride: 112 mEq/L (ref 96–112)
GFR calc Af Amer: >90 mL/min (ref >90)
Glucose, Bld: 100 mg/dL — ABNORMAL HIGH (ref 70–99)
Potassium: 3.3 mEq/L — ABNORMAL LOW (ref 3.7–5.3)
Sodium: 145 mEq/L (ref 137–147)

## 2013-06-26 LAB — POCT I-STAT, CHEM 8
CREATININE: 0.6 mg/dL (ref 0.50–1.10)
Calcium, Ion: 1.21 mmol/L (ref 1.12–1.23)
Chloride: 105 mEq/L (ref 96–112)
Glucose, Bld: 134 mg/dL — ABNORMAL HIGH (ref 70–99)
HCT: 22 % — ABNORMAL LOW (ref 36.0–46.0)
Hemoglobin: 7.5 g/dL — ABNORMAL LOW (ref 12.0–15.0)
Potassium: 3 mEq/L — ABNORMAL LOW (ref 3.7–5.3)
SODIUM: 143 meq/L (ref 137–147)
TCO2: 22 mmol/L (ref 0–100)

## 2013-06-26 LAB — PREPARE PLATELET PHERESIS: Unit division: 0

## 2013-06-26 LAB — VITAMIN B1: Vitamin B1 (Thiamine): 77 nmol/L — ABNORMAL HIGH (ref 8–30)

## 2013-06-26 MED ORDER — ACD FORMULA A 0.73-2.45-2.2 GM/100ML VI SOLN
Status: AC
  Administered 2013-06-26: 17:00:00
  Filled 2013-06-26: qty 500

## 2013-06-26 MED ORDER — ACD FORMULA A 0.73-2.45-2.2 GM/100ML VI SOLN
Status: AC
  Administered 2013-06-26: 500 mL via INTRAVENOUS
  Filled 2013-06-26: qty 500

## 2013-06-26 MED ORDER — ONDANSETRON HCL 4 MG/2ML IJ SOLN
4.0000 mg | Freq: Four times a day (QID) | INTRAMUSCULAR | Status: DC | PRN
  Administered 2013-06-26 (×2): 4 mg via INTRAVENOUS
  Filled 2013-06-26 (×3): qty 2

## 2013-06-26 MED ORDER — POTASSIUM CHLORIDE 10 MEQ/100ML IV SOLN
10.0000 meq | INTRAVENOUS | Status: AC
  Administered 2013-06-26: 10 meq via INTRAVENOUS

## 2013-06-26 MED ORDER — CALCIUM CARBONATE ANTACID 500 MG PO CHEW
2.0000 | CHEWABLE_TABLET | ORAL | Status: AC
  Filled 2013-06-26 (×2): qty 2

## 2013-06-26 MED ORDER — ACETAMINOPHEN 325 MG PO TABS: 650.0000 mg | ORAL_TABLET | ORAL | Status: DC | PRN

## 2013-06-26 MED ORDER — SODIUM CHLORIDE 0.9 % IV SOLN
4.0000 g | Freq: Once | INTRAVENOUS | Status: AC
  Administered 2013-06-26: 4 g via INTRAVENOUS
  Filled 2013-06-26: qty 40

## 2013-06-26 MED ORDER — ACD FORMULA A 0.73-2.45-2.2 GM/100ML VI SOLN
500.0000 mL | Status: DC
  Administered 2013-06-26: 500 mL via INTRAVENOUS
  Filled 2013-06-26: qty 500

## 2013-06-26 MED ORDER — SODIUM CHLORIDE 0.9 % IV SOLN
INTRAVENOUS | Status: AC
  Administered 2013-06-26 (×3): via INTRAVENOUS_CENTRAL
  Filled 2013-06-26 (×3): qty 200

## 2013-06-26 MED ORDER — POTASSIUM CHLORIDE 10 MEQ/50ML IV SOLN
INTRAVENOUS | Status: AC
  Administered 2013-06-26: 10 meq
  Filled 2013-06-26: qty 50

## 2013-06-26 MED ORDER — HEPARIN SODIUM (PORCINE) 1000 UNIT/ML IJ SOLN
1000.0000 [IU] | Freq: Once | INTRAMUSCULAR | Status: DC
  Filled 2013-06-26: qty 1

## 2013-06-26 NOTE — Progress Notes (Signed)
NUTRITION FOLLOW-UP  DOCUMENTATION CODES Per approved criteria  -Not Applicable   INTERVENTION: Once ready to advance, per surgery, recommend advancement of Osmolite 1.5 by 10 ml q 12 hours to goal rate of 45 ml/hr. Goal rate will provide: 1620 kcal, 68 grams protein, 823 ml free water. *Pt would benefit from slow advancement and close monitoring of electrolytes given refeeding syndrome risk.* Monitor magnesium, potassium, and phosphorus daily for at least 3 days, MD to replete as needed, as pt is at risk for refeeding syndrome given prolonged poor oral intake.  RD to continue to follow nutrition care plan.  NUTRITION DIAGNOSIS: Inadequate oral intake related to AMS as evidenced by limited oral intake.  Ongoing.  Goal: Intake to meet >90% of estimated nutrition needs. Not met yet.  Monitor:  weight trends, lab trends, I/O's, TF tolerance  ASSESSMENT: PMHx significant for catatonia, HTN, anxiety, gastric bypass. From SNF.  Recent admission from 12/13 - 12/28 with toxic metabolic encephalopathy, hospital course was complicated with pyelonephritis and C. Difficile colitis. Pt was scheduled for PEG on 1/20, was found to be agitated and confused and was sent to ED.   Neuro saw pt on 1/21 and recommended plasma exchanges every other day as well as suppressive steroids to reduce her seizure foci.  Per CSW note from [facility], SW at [facility] reported that patient has not eaten since she was admitted to the facility. (PA contacted SNF and they confirmed that pt has had NO intake for >1 week PTA.)  SW stated that the facility tried to get a PEG, but pt has a hx of gastric bypass, and SW was wondering if pt could get a J-tube during this admission.   Pt developed frequent amount of large stooling from vagina on 1/25 and was sent to SDU.   Underwent J-tube placement 1/27. Pt with orders to start TF last evening however they were not started 2/2 miscommunication with the service. Currently receiving  Osmolite 1.5 at 10 ml/hr. Pt with emesis - nurse tech aware, suspects that is her medications. Reports that nurse is about to give pt some zofran.  Potassium remains low at 3.3. Prealbumin is low at 15.8.  Height: Ht Readings from Last 1 Encounters:  06/20/13 _0  (1.6 m)    Weight: Wt Readings from Last 1 Encounters:  06/26/13 161 lb 13.1 oz (73.4 kg)  Admit wt 152 lb  BMI:  Body mass index is 28.67 kg/(m^2). Overweight  Estimated Nutritional Needs: Kcal: 1600 - 1800 Protein: 75 - 90 g Fluid: 1.8 - 2 liters  Skin: intact  Diet Order: Dysphagia   EDUCATION NEEDS: -No education needs identified at this time   Intake/Output Summary (Last 24 hours) at 06/26/13 1201 Last data filed at 06/26/13 6599  Gross per 24 hour  Intake 1221.25 ml  Output      0 ml  Net 1221.25 ml    Last BM: 1/26  Labs:   Recent Labs Lab 06/24/13 0503 06/25/13 0330 06/26/13 0330  NA 149* 150* 145  K 2.8* 3.2* 3.3*  CL 117* 122* 112  CO2 19 17* 20  BUN 15 9 4*  CREATININE 0.63 0.60 0.58  CALCIUM 8.1* 7.9* 8.1*  GLUCOSE 95 90 100*    CBG (last 3)   Recent Labs  06/24/13 0804 06/25/13 0734 06/26/13 0719  GLUCAP 93 94 108*   Prealbumin  Date/Time Value Range Status  06/24/2013  4:30 PM 11.7* 17.0 - 34.0 mg/dL Final     Performed at  [facility]    Scheduled Meds: . therapeutic plasma exchange solution   Dialysis Q1 Hr x 3  . calcium carbonate  2 tablet Oral Q3H  . calcium gluconate IVPB  4 g Intravenous Once  . divalproex  500 mg Oral Q8H  . heparin  1,000 Units Intracatheter Once  . levETIRAcetam  1,000 mg Oral BID  . metoprolol tartrate  50 mg Oral BID  . mirtazapine  30 mg Oral QHS  . multivitamin with minerals  1 tablet Oral Daily  . piperacillin-tazobactam (ZOSYN)  IV  3.375 g Intravenous Q8H  . risperiDONE  0.5 mg Oral BID  . thiamine  100 mg Oral Daily    Continuous Infusions: . citrate dextrose    . citrate dextrose    . dextrose 5 % and 0.45 %  NaCl with KCl 20 mEq/L 75 mL/hr at 06/26/13 0820  . feeding supplement (OSMOLITE 1.5 CAL) 1,000 mL (06/26/13 0850)    Inda Coke MS, RD, LDN Pager: [phone] After-hours pager: [phone]

## 2013-06-26 NOTE — Progress Notes (Signed)
NEURO HOSPITALIST PROGRESS NOTE   SUBJECTIVE:                                                                                                                        Mrs. Schirm is awake and following commands appropriately. She complains of HA and no feeling well. On Depakote 500 mg TID and keppra 1,000 mg BID. No clinical seizures reported.   OBJECTIVE:                                                                                                                           Vital signs in last 24 hours: Temp:  [97.5 F (36.4 C)-99.9 F (37.7 C)] 99.3 F (37.4 C) (01/28 0716) Pulse Rate:  [110-146] 122 (01/28 0621) Resp:  [9-34] 15 (01/28 0621) BP: (137-153)/(75-94) 152/91 mmHg (01/28 0621) SpO2:  [100 %] 100 % (01/28 0621) Weight:  [73.4 kg (161 lb 13.1 oz)] 73.4 kg (161 lb 13.1 oz) (01/28 0422)  Intake/Output from previous day: 01/27 0701 - 01/28 0700 In: 2053 [I.V.:1670; Blood:283; IV Piggyback:100] Out: 75 [Blood:75] Intake/Output this shift:   Nutritional status: Dysphagia  Past Medical History  Diagnosis Date  . Peripheral neuropathy   . Fibromyalgia   . Hypertension   . Anxiety   . Catatonia   . Seizures      Neurologic Exam:  Mental Status: Alert, awake, oriented x 4.  Speech fluent without evidence of aphasia.  Able to follow 3 step commands without difficulty. Cranial Nerves: II: Discs flat bilaterally; Visual fields grossly normal, pupils equal, round, reactive to light and accommodation [name],IV, VI: ptosis not present, extra-ocular motions intact bilaterally V,VII: smile symmetric, facial light touch sensation normal bilaterally VIII: hearing normal bilaterally IX,X: gag reflex present XI: bilateral shoulder shrug XII: midline tongue extension without atrophy or fasciculations  Motor: Seems to be able to move all limbs symmetrically. Tone and bulk:normal tone throughout; no atrophy noted Sensory: Pinprick and  light touch intact throughout, bilaterally Deep Tendon Reflexes:  1+ all over Plantars: Right: downgoing   Left: downgoing Cerebellar: No tested. Gait: No tested. CV: pulses palpable throughout    Lab Results: No results found for this basename: cbc, bmp, coags, chol, tri, ldl, hga1c   Lipid  Panel No results found for this basename: CHOL, TRIG, HDL, CHOLHDL, VLDL, LDLCALC,  in the last 72 hours  Studies/Results: No results found.  MEDICATIONS                                                                                                                       I have reviewed the patient's current medications.  ASSESSMENT/PLAN:                                                                                                            53 year old female with altered mental status that appears to be improved today.Given her continued hallucinations, and other concerning factors, autoimmune encephalitis has been considered despite CSF panel that was negative for this and unremarkable brain MRI. Previously delineated plan is for plasmapheresis every other day and Solumedrol 500 mg bid x 3 days. Will follow up.   [name] Portelasvaldo Arriyanna Mersch, MD [facility] [phone]  06/26/2013, 10:15 AM

## 2013-06-26 NOTE — Progress Notes (Signed)
Pt j-tube placement verified via auscultation, tube flushed with 20cc sterile water, and osmolite 1.5 started back at 10cc. Pt given IV zofran prn. Will continue to monitor.

## 2013-06-26 NOTE — Progress Notes (Signed)
ANTIBIOTIC CONSULT NOTE - FOLLOW UP  Pharmacy Consult for Zosyn Indication: Empiric, possible GI/GU fistula  Allergies  Allergen Reactions  . Morphine And Related Other (See Comments)    REACTION: Causes pain  . Paroxetine Hcl Other (See Comments)    REACTION:  unknown  . Penicillins Other (See Comments)    REACTION: Makes her body hurt.  . Sertraline Hcl Other (See Comments)    REACTION:  unknown    Patient Measurements: Height: 5\' 3"  (160 cm) Weight: 161 lb 13.1 oz (73.4 kg) IBW/kg (Calculated) : 52.4 Adjusted Body Weight:   Vital Signs: Temp: 99.3 F (37.4 C) (01/28 0716) Temp src: Oral (01/28 0716) BP: 152/91 mmHg (01/28 0621) Pulse Rate: 122 (01/28 0621) Intake/Output from previous day: 01/27 0701 - 01/28 0700 In: 2053 [I.V.:1670; Blood:283; IV Piggyback:100] Out: 75 [Blood:75] Intake/Output from this shift:    Labs:  Recent Labs  06/24/13 0503 06/25/13 0330 06/26/13 0330  WBC 4.2 3.8* 6.1  HGB 8.6* 7.9* 7.5*  PLT 59* 46* 72*  CREATININE 0.63 0.60 0.58   Estimated Creatinine Clearance: 79.9 ml/min (by C-G formula based on Cr of 0.58). No results found for this basename: VANCOTROUGH, Leodis Binet, VANCORANDOM, GENTTROUGH, GENTPEAK, GENTRANDOM, TOBRATROUGH, TOBRAPEAK, TOBRARND, AMIKACINPEAK, AMIKACINTROU, AMIKACIN,  in the last 72 hours   Microbiology: Recent Results (from the past 720 hour(s))  CLOSTRIDIUM DIFFICILE BY PCR     Status: None   Collection Time    05/27/13  1:43 PM      Result Value Range Status   C difficile by pcr NEGATIVE  NEGATIVE Final  URINE CULTURE     Status: None   Collection Time    06/13/13  7:13 PM      Result Value Range Status   Specimen Description URINE, CATHETERIZED   Final   Special Requests NONE   Final   Culture  Setup Time     Final   Value: 06/14/2013 02:33     Performed at [facility] Count     Final   Value: NO GROWTH     Performed at [facility]   Culture     Final   Value: NO  GROWTH     Performed at [facility]   Report Status 06/14/2013 FINAL   Final  CLOSTRIDIUM DIFFICILE BY PCR     Status: None   Collection Time    06/19/13  5:14 PM      Result Value Range Status   C difficile by pcr NEGATIVE  NEGATIVE Final  MRSA PCR SCREENING     Status: None   Collection Time    06/19/13  5:14 PM      Result Value Range Status   MRSA by PCR NEGATIVE  NEGATIVE Final   Comment:            The GeneXpert MRSA Assay (FDA     approved for NASAL specimens     only), is one component of a     comprehensive MRSA colonization     surveillance program. It is not     intended to diagnose MRSA     infection nor to guide or     monitor treatment for     MRSA infections.  STOOL CULTURE     Status: None   Collection Time    06/19/13  5:14 PM      Result Value Range Status   Specimen Description STOOL   Final   Special  Requests NONE   Final   Culture     Final   Value: NO SALMONELLA, SHIGELLA, CAMPYLOBACTER, YERSINIA, OR E.COLI 0157:H7 ISOLATED     [facility]   Report Status 06/23/2013 FINAL   Final  CULTURE, BLOOD (ROUTINE X 2)     Status: None   Collection Time    06/23/13  2:02 PM      Result Value Range Status   Specimen Description BLOOD LEFT ARM   Final   Special Requests BOTTLES DRAWN AEROBIC ONLY 10CC   Final   Culture  Setup Time     Final   Value: 06/23/2013 19:55     [facility]formed at [facility]   Culture     Final   Value:        BLOOD CULTURE RECEIVED NO GROWTH TO DATE CULTURE WILL BE HELD FOR 5 DAYS BEFORE ISSUING A FINAL NEGATIVE REPORT     [facility]   Report Status PENDING   Incomplete  CULTURE, BLOOD (ROUTINE X 2)     Status: None   Collection Time    06/23/13  2:14 PM      Result Value Range Status   Specimen Description BLOOD LEFT HAND   Final   Special Requests BOTTLES DRAWN AEROBIC ONLY 5.5 CC   Final   Culture  Setup Time     Final   Value: 06/23/2013 19:55     [facility]formed at  [facility]   Culture     Final   Value:        BLOOD CULTURE RECEIVED NO GROWTH TO DATE CULTURE WILL BE HELD FOR 5 DAYS BEFORE ISSUING A FINAL NEGATIVE REPORT     [facility]formed at [facility]   Report Status PENDING   Incomplete  CLOSTRIDIUM DIFFICILE BY PCR     Status: None   Collection Time    06/23/13  5:08 PM      Result Value Range Status   C difficile by pcr NEGATIVE  NEGATIVE Final  URINE CULTURE     Status: None   Collection Time    06/23/13  6:15 PM      Result Value Range Status   Specimen Description URINE, CLEAN CATCH   Final   Special Requests NONE   Final   Culture  Setup Time     Final   Value: 06/24/2013 01:22     [facility]   Colony Count     Final   Value: NO GROWTH     [facility]formed at [facility]   Culture     Final   Value: NO GROWTH     [facility]   Report Status 06/24/2013 FINAL   Final    Anti-infectives   Start     Dose/Rate Route Frequency Ordered Stop   06/23/13 1400  piperacillin-tazobactam (ZOSYN) IVPB 3.375 g     3.375 g 12.5 mL/hr over 240 Minutes Intravenous 3 times per day 06/23/13 1336     06/20/13 1200  vancomycin (VANCOCIN) IVPB 1000 mg/200 mL premix     1,000 mg 200 mL/hr over 60 Minutes Intravenous  Once 06/20/13 1146 06/20/13 1433      Assessment: 52yof on Zosyn Day 4 for empiric coverage. Per 1/26 [facility] note, MD concerned for possible GI/GU fistula with stool found in the vagina. Patient has remained afebrile, WBC wnl and cultures have reported no growth. Renal function  has remained stable - will continue current Zosyn regimen.    Plan:  1. Continue Zosyn 3.375g IV q8h for now 2. Discussed indication with [facility] - MD [place] planning to evaluate if true fistula and will make further decision in regards to continuation/LOT 3. Monitor renal function, cultures and clinical course  Cleon Dew [phone] 06/26/2013,9:09 AM

## 2013-06-26 NOTE — Progress Notes (Addendum)
Pt c/o nausea this am; new order for zofran; admin. This pm, Pt had N/V. Headed to HD for plasma TX, d/c TF, notified attending. Paged [name]. ([place]

## 2013-06-26 NOTE — Progress Notes (Signed)
[facility] PROGRESS NOTE  Lakitha Gordy XBJ:[id number] DOB: 02/03/1961 DOA: 06/19/2013 PCP: [name], MD  HPI/Subjective: 53 yr old female presents with a CC of Altered mental status. Pt has a PMH of peripheral neuropathy, seizures, catatonia, anxiety, HTN, and fibromyalgia. Pt was previously admitted on 12/13 thru 12/28 with toxic metabolic encephalopathy.  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  EEG showed no recurrent seizures. A 24 hour EEG  showed an intermittent delta pattern felt to represent Frontal Intermittent Rhythmic Delta Activity. Pt was started on two antiepileptics.  This admission, she was again found altered and seemed "comatose" . Since d/c in December, pt has had persistent confusion and is confined to a wheelchair at [facility]. She is currently confused and experiencing visual hallucinations.  Subjective: Patient is more alert, communicating. Denies any pain.  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 -Pt received diatech catheter placement and plasmapheresis on 06/20/13.   -Plan is for plasmapheresis every other day and Solumedrol 500 mg bid x 3 days. D/C on 1/24. -MRI of the brain showed no acute abnormality.  Detailed results listed below. -Ammonia level is normal - CT chest and transvaginal ultrasound showed no abnormalities. -[facility] paraneoplastic panel reported to be negative, MRI of the C-spine is negative for acute findings  Pending:   - serum NMDA.   Tachycardia -Resolved,  after patient was started back on metoprolol  Hx of Convulsions/Seizures  -Pt has a PMH of seizures and catatonia.   -MRI of the brain showed thalamic signal abnormality during previous admission.  No longer present on current MRI. -Pt currently taking Keppra and Depakene.  Increased Depakene to 500mg  tid -Valproic acid lvl 35.1 was low.  Re-check levels in the AM.  Hx  of Anxiety  -Continue pt on Remeron -Patient appears to have had a psychiatric history  -Current illness is bringing out psychiatric symptoms. -Psychiatry consulted as psychotropic medications may help with her current symptoms. -Psych prescribed Risperdal .5 mg bid.  Hypertension  -Pt has a PMH of HTN and most recent BP reading was 151/108 -Likely secondary to anxiety and high dose steroids. Continue metoprolol  Malnutrition with history of Gastric Bypass -Per SNF patient has had no PO intake for over 1 week prior to admission. -SNF sent her to hospital for J tube placement. -Gen Surg consulted, J-tube placed  Folate deficiency -Replete parenterally.  Recent hx of c-diff (05/18/13)  -Pt negative for C-diff at this admit  Thrombocytopenia -Discontinue heparin, seems to be chronic up and down, could be secondary to folate deficiency.  Enterovaginal fistula -Reported prior to that this patient had some stools in her vagina. -Discussed with radiology, no evidence of rectovaginal fistula and the previous imaging, (patient had indicated pelvic scan done previously). - Consulted GI, and the did not feel that patient will benefit from colonoscopy for the diagnosis of fistula. - Called and discussed with Gyn on-call, and they recommend to follow the patient as outpatient. No further workup required in the hospital.  DVT Prophylaxis:  Heparin  Code Status: Full Code Family Communication: Son was contacted to explain procedures and status Disposition Plan: D/C to SNF when medically appropriate.   Consultants:  Psych  Neuro  Procedures:  Plasmapheresis  PICC Line Placement  Antibiotics:  None  Objective: Filed Vitals:   06/26/13 1200 06/26/13 1336 06/26/13 1354 06/26/13 1400  BP:  116/66 107/69 115/59  Pulse:  102 104 105  Temp: 100 F (37.8 C) 98.7 F (37.1 C) 98.6 F (37 C)   TempSrc: Axillary Oral Oral   Resp:  15 20 19   Height:      Weight:      SpO2:   100% 100% 100%    Intake/Output Summary (Last 24 hours) at 06/26/13 1426 Last data filed at 06/26/13 1254  Gross per 24 hour  Intake 1146.25 ml  Output      1 ml  Net 1145.25 ml   Filed Weights   06/24/13 1304 06/25/13 0330 06/26/13 0422  Weight: 73.1 kg (161 lb 2.5 oz) 73.7 kg (162 lb 7.7 oz) 73.4 kg (161 lb 13.1 oz)    Exam: Physical Exam: Head: Normocephalic, atraumatic.  Eyes: No signs of jaundice, EOMI Nose: Mucous membranes dry.  Throat: Oropharynx nonerythematous, no exudate appreciated.  Neck: supple,No deformities, masses, or tenderness noted. Lungs: Normal respiratory effort. B/L Clear to auscultation, no crackles or wheezes.  Heart: Regular RR. S1 and S2 normal  Abdomen: BS normoactive. Soft, Nondistended, non-tender.  Extremities: No pretibial edema, no erythema      Data Reviewed: Basic Metabolic Panel:  Recent Labs Lab 06/22/13 0500  06/23/13 0455 06/24/13 0503 06/25/13 0330 06/26/13 0330 06/26/13 1354  NA 143  < > 145 149* 150* 145 143  K 4.0  < > 3.9 2.8* 3.2* 3.3* 3.0*  CL 109  < > 114* 117* 122* 112 105  CO2 21  --  19 19 17* 20  --   GLUCOSE 115*  < > 121* 95 90 100* 134*  BUN 10  < > 12 15 9  4* <3*  CREATININE 0.66  < > 0.60 0.63 0.60 0.58 0.60  CALCIUM 8.8  --  8.8 8.1* 7.9* 8.1*  --   < > = values in this interval not displayed. Liver Function Tests:  Recent Labs Lab 06/19/13 1925 06/20/13 0515  AST 23 25  ALT 9 9  ALKPHOS 35* 38*  BILITOT 0.4 0.4  PROT 5.9* 6.3  ALBUMIN 2.6* 2.7*    Recent Labs Lab 06/19/13 1925  AMMONIA 24   CBC:  Recent Labs Lab 06/19/13 1925  06/22/13 0500  06/23/13 0455 06/24/13 0503 06/25/13 0330 06/26/13 0330 06/26/13 1354  WBC 5.0  < > 6.1  --  4.0 4.2 3.8* 6.1  --   NEUTROABS 2.8  --   --   --   --   --   --   --   --   HGB 11.2*  < > 10.6*  < > 10.2* 8.6* 7.9* 7.5* 7.5*  HCT 34.8*  < > 31.9*  < > 30.6* 25.9* 23.4* 22.3* 22.0*  MCV 96.9  < > 93.8  --  94.2 94.9 95.5 94.5  --   PLT  113*  < > 101*  --  72* 59* 46* 72*  --   < > = values in this interval not displayed. CBG:  Recent Labs Lab 06/23/13 0754 06/24/13 0804 06/25/13 0734 06/26/13 0719 06/26/13 1141  GLUCAP 124* 93 94 108* 111*    Recent Results (from the past 240 hour(s))  CLOSTRIDIUM DIFFICILE BY PCR     Status: None   Collection Time    06/19/13  5:14 PM      Result Value Range Status   C difficile by pcr NEGATIVE  NEGATIVE Final  MRSA PCR SCREENING     Status: None   Collection Time    06/19/13  5:14 PM  Result Value Range Status   MRSA by PCR NEGATIVE  NEGATIVE Final   Comment:            The GeneXpert MRSA Assay (FDA     approved for NASAL specimens     only), is one component of a     comprehensive MRSA colonization     surveillance program. It is not     intended to diagnose MRSA     infection nor to guide or     monitor treatment for     MRSA infections.  STOOL CULTURE     Status: None   Collection Time    06/19/13  5:14 PM      Result Value Range Status   Specimen Description STOOL   Final   Special Requests NONE   Final   Culture     Final   Value: NO SALMONELLA, SHIGELLA, CAMPYLOBACTER, YERSINIA, OR E.COLI 0157:H7 ISOLATED     Performed at [facility]   Report Status 06/23/2013 FINAL   Final  CULTURE, BLOOD (ROUTINE X 2)     Status: None   Collection Time    06/23/13  2:02 PM      Result Value Range Status   Specimen Description BLOOD LEFT ARM   Final   Special Requests BOTTLES DRAWN AEROBIC ONLY 10CC   Final   Culture  Setup Time     Final   Value: 06/23/2013 19:55     Performed at [facility]   Culture     Final   Value:        BLOOD CULTURE RECEIVED NO GROWTH TO DATE CULTURE WILL BE HELD FOR 5 DAYS BEFORE ISSUING A FINAL NEGATIVE REPORT     Performed at [facility]   Report Status PENDING   Incomplete  CULTURE, BLOOD (ROUTINE X 2)     Status: None   Collection Time    06/23/13  2:14 PM      Result Value Range Status   Specimen  Description BLOOD LEFT HAND   Final   Special Requests BOTTLES DRAWN AEROBIC ONLY 5.5 CC   Final   Culture  Setup Time     Final   Value: 06/23/2013 19:55     Performed at [facility]   Culture     Final   Value:        BLOOD CULTURE RECEIVED NO GROWTH TO DATE CULTURE WILL BE HELD FOR 5 DAYS BEFORE ISSUING A FINAL NEGATIVE REPORT     Performed at [facility]   Report Status PENDING   Incomplete  CLOSTRIDIUM DIFFICILE BY PCR     Status: None   Collection Time    06/23/13  5:08 PM      Result Value Range Status   C difficile by pcr NEGATIVE  NEGATIVE Final  URINE CULTURE     Status: None   Collection Time    06/23/13  6:15 PM      Result Value Range Status   Specimen Description URINE, CLEAN CATCH   Final   Special Requests NONE   Final   Culture  Setup Time     Final   Value: 06/24/2013 01:22     Performed at [facility]   Colony Count     Final   Value: NO GROWTH     Performed at [facility]   Culture     Final   Value: NO GROWTH  Performed at [facility]   Report Status 06/24/2013 FINAL   Final     Studies: No results found.  Scheduled Meds: . calcium carbonate  2 tablet Oral Q3H  . calcium gluconate IVPB  4 g Intravenous Once  . divalproex  500 mg Oral Q8H  . heparin  1,000 Units Intracatheter Once  . levETIRAcetam  1,000 mg Oral BID  . metoprolol tartrate  50 mg Oral BID  . mirtazapine  30 mg Oral QHS  . multivitamin with minerals  1 tablet Oral Daily  . piperacillin-tazobactam (ZOSYN)  IV  3.375 g Intravenous Q8H  . risperiDONE  0.5 mg Oral BID  . thiamine  100 mg Oral Daily   Continuous Infusions: . citrate dextrose    . citrate dextrose    . dextrose 5 % and 0.45 % NaCl with KCl 20 mEq/L 75 mL/hr at 06/26/13 0820  . feeding supplement (OSMOLITE 1.5 CAL) 1,000 mL (06/26/13 0850)    Principal Problem:   Acute encephalopathy Active Problems:   Convulsions/seizures   Toxic metabolic encephalopathy    Psychosis    [facility] S [facility] Pager [phone]. If 7PM-7AM, please contact night-coverage at [email or web address], password [facility] 06/26/2013, 2:26 PM  LOS: 7 days

## 2013-06-26 NOTE — Progress Notes (Signed)
To the OR today for J-tube Marta LamasJames O. Gae BonWyatt, [name], MD, FACS [phone]--pager [phone]--office [facility]

## 2013-06-26 NOTE — Progress Notes (Signed)
[name] [place] Progress Note 1 Day Post-Op  Subjective: Patient did not start tube feedings last as a result of miscommunication with the service.  She is doing fine   Objective: Vital signs in last 24 hours: Temp:  [97.5 F (36.4 C)-99.9 F (37.7 C)] 99.3 F (37.4 C) (01/28 0716) Pulse Rate:  [110-146] 122 (01/28 0621) Resp:  [9-34] 15 (01/28 0621) BP: (137-153)/(75-94) 152/91 mmHg (01/28 0621) SpO2:  [100 %] 100 % (01/28 0621) Weight:  [73.4 kg (161 lb 13.1 oz)] 73.4 kg (161 lb 13.1 oz) (01/28 0422) Last BM Date: 06/24/13  Intake/Output from previous day: 01/27 0701 - 01/28 0700 In: 2053 [I.V.:1670; Blood:283; IV Piggyback:100] Out: 75 [Blood:75] Intake/Output this shift:    General: No acute distress.  Lungs: Clear   Abd: Soft, non-tender  Extremities: No changes  Neuro: The same.  Lab Results:  @LABLAST2 (wbc:2,hgb:2,hct:2,plt:2) BMET  Recent Labs  06/25/13 0330 06/26/13 0330  NA 150* 145  K 3.2* 3.3*  CL 122* 112  CO2 17* 20  GLUCOSE 90 100*  BUN 9 4*  CREATININE 0.60 0.58  CALCIUM 7.9* 8.1*   PT/INR No results found for this basename: LABPROT, INR,  in the last 72 hours ABG No results found for this basename: PHART, PCO2, PO2, HCO3,  in the last 72 hours  Studies/Results: No results found.  Anti-infectives: Anti-infectives   Start     Dose/Rate Route Frequency Ordered Stop   06/23/13 1400  piperacillin-tazobactam (ZOSYN) IVPB 3.375 g     3.375 g 12.5 mL/hr over 240 Minutes Intravenous 3 times per day 06/23/13 1336     06/20/13 1200  vancomycin (VANCOCIN) IVPB 1000 mg/200 mL premix     1,000 mg 200 mL/hr over 60 Minutes Intravenous  Once 06/20/13 1146 06/20/13 1433      Assessment/Plan: s/p Procedure(s):  OPEN JEJUNOSTOMY FEEDING TUBE  Advance diet Can start tube feedings right away.  Now the Hospitalist was us to address potential recto-vaginal fistula.  Apparently previous studies have not identified a fistula.  May need GI consultation.  LOS: 7 days   Marta LamasJames O. Gae BonWyatt, [name], MD, FACS [phone]--pager [phone]--office [facility] 06/26/2013

## 2013-06-27 ENCOUNTER — Encounter (HOSPITAL_COMMUNITY): Payer: Self-pay | Admitting: General Surgery

## 2013-06-27 ENCOUNTER — Inpatient Hospital Stay (HOSPITAL_COMMUNITY): Payer: Medicare Other

## 2013-06-27 DIAGNOSIS — G0490 Encephalitis and encephalomyelitis, unspecified: Secondary | ICD-10-CM | POA: Diagnosis not present

## 2013-06-27 DIAGNOSIS — R112 Nausea with vomiting, unspecified: Secondary | ICD-10-CM

## 2013-06-27 DIAGNOSIS — R4182 Altered mental status, unspecified: Secondary | ICD-10-CM | POA: Diagnosis not present

## 2013-06-27 LAB — COMPREHENSIVE METABOLIC PANEL
ALBUMIN: 3.1 g/dL — AB (ref 3.5–5.2)
ALK PHOS: 16 U/L — AB (ref 39–117)
ALT: 9 U/L (ref 0–35)
AST: 12 U/L (ref 0–37)
BILIRUBIN TOTAL: 0.7 mg/dL (ref 0.3–1.2)
BUN: <3 mg/dL — ABNORMAL LOW (ref 6–23)
CHLORIDE: 111 meq/L (ref 96–112)
CO2: 20 mEq/L (ref 19–32)
Calcium: 8 mg/dL — ABNORMAL LOW (ref 8.4–10.5)
Creatinine, Ser: 0.52 mg/dL (ref 0.50–1.10)
GFR calc Af Amer: >90 mL/min (ref >90)
Glucose, Bld: 116 mg/dL — ABNORMAL HIGH (ref 70–99)
POTASSIUM: 3.3 meq/L — AB (ref 3.7–5.3)
Sodium: 145 mEq/L (ref 137–147)
Total Protein: 4.6 g/dL — ABNORMAL LOW (ref 6.0–8.3)

## 2013-06-27 LAB — GLUCOSE, CAPILLARY
GLUCOSE-CAPILLARY: 120 mg/dL — AB (ref 70–99)
GLUCOSE-CAPILLARY: 119 mg/dL — AB (ref 70–99)
GLUCOSE-CAPILLARY: 123 mg/dL — AB (ref 70–99)
Glucose-Capillary: 118 mg/dL — ABNORMAL HIGH (ref 70–99)
Glucose-Capillary: 126 mg/dL — ABNORMAL HIGH (ref 70–99)
Glucose-Capillary: 101 mg/dL — ABNORMAL HIGH (ref 70–99)
Glucose-Capillary: 121 mg/dL — ABNORMAL HIGH (ref 70–99)

## 2013-06-27 LAB — CBC
HEMATOCRIT: 20.9 % — AB (ref 36.0–46.0)
HEMOGLOBIN: 7.2 g/dL — AB (ref 12.0–15.0)
MCH: 32.3 pg (ref 26.0–34.0)
MCHC: 34.4 g/dL (ref 30.0–36.0)
MCV: 93.7 fL (ref 78.0–100.0)
Platelets: 60 10*3/uL — ABNORMAL LOW (ref 150–400)
RBC: 2.23 MIL/uL — ABNORMAL LOW (ref 3.87–5.11)
RDW: 16.9 % — ABNORMAL HIGH (ref 11.5–15.5)
WBC: 8.4 10*3/uL (ref 4.0–10.5)

## 2013-06-27 LAB — BASIC METABOLIC PANEL
CO2: 19 meq/L (ref 19–32)
Calcium: 8.1 mg/dL — ABNORMAL LOW (ref 8.4–10.5)
Chloride: 110 mEq/L (ref 96–112)
Creatinine, Ser: 0.48 mg/dL — ABNORMAL LOW (ref 0.50–1.10)
GFR calc non Af Amer: >90 mL/min (ref >90)
Glucose, Bld: 133 mg/dL — ABNORMAL HIGH (ref 70–99)
POTASSIUM: 3.2 meq/L — AB (ref 3.7–5.3)
Sodium: 143 mEq/L (ref 137–147)

## 2013-06-27 LAB — BETA ISOFORM (CREUTZFELDT-JAKOB DISEASE)

## 2013-06-27 MED ORDER — METOPROLOL TARTRATE 1 MG/ML IV SOLN
5.0000 mg | Freq: Four times a day (QID) | INTRAVENOUS | Status: DC
  Administered 2013-06-27 – 2013-07-09 (×46): 5 mg via INTRAVENOUS
  Filled 2013-06-27 (×54): qty 5

## 2013-06-27 MED ORDER — SODIUM CHLORIDE 0.9 % IV SOLN
1000.0000 mg | Freq: Two times a day (BID) | INTRAVENOUS | Status: DC
  Administered 2013-06-27 – 2013-07-13 (×34): 1000 mg via INTRAVENOUS
  Filled 2013-06-27 (×39): qty 10

## 2013-06-27 MED ORDER — PROMETHAZINE HCL 25 MG/ML IJ SOLN
12.5000 mg | Freq: Four times a day (QID) | INTRAMUSCULAR | Status: DC | PRN
  Administered 2013-06-27 (×2): 12.5 mg via INTRAVENOUS
  Filled 2013-06-27 (×2): qty 1

## 2013-06-27 MED ORDER — ONDANSETRON HCL 4 MG/2ML IJ SOLN
4.0000 mg | INTRAMUSCULAR | Status: DC | PRN
  Administered 2013-06-27 – 2013-07-15 (×6): 4 mg via INTRAVENOUS
  Filled 2013-06-27 (×5): qty 2

## 2013-06-27 MED ORDER — VALPROATE SODIUM 500 MG/5ML IV SOLN
500.0000 mg | Freq: Three times a day (TID) | INTRAVENOUS | Status: DC
  Administered 2013-06-27 – 2013-07-14 (×46): 500 mg via INTRAVENOUS
  Filled 2013-06-27 (×61): qty 5

## 2013-06-27 NOTE — Progress Notes (Signed)
The patient's vomitus i s bilious and not tube feedings.  You should resume the tube feedings tomorrow although I would get an UGI study to see if the J-tube has kinked the bowel so that she is proximally obstructed.  Get UGI/SBFT with barium to look for obstruction proximally, but can give tube feedings as we have been.    Marta LamasJames O. Gae BonWyatt, [name], MD, FACS [phone]--pager [phone]--office [facility]

## 2013-06-27 NOTE — Progress Notes (Signed)
Paged dr Sharl Malama mentionned to call dr wyat.  Called dr wyatt regarding pt having projectile vomiting of green bile large amount approx. 200cc. New orders rec'd to insert ng tube and hold tube feeds till procedure done in am.

## 2013-06-27 NOTE — Clinical Social Work Note (Signed)
[facility].  Roddie McBryant Cote Mayabb, CrimoraLCSWA, [place]ASA, [phone]

## 2013-06-27 NOTE — Progress Notes (Signed)
[facility] PROGRESS NOTE  Forever Arechiga ZOX:[id number] DOB: 01/02/1961 DOA: 06/19/2013 PCP: [name], MD  HPI/Subjective: 53 yr old female presents with a CC of Altered mental status. Pt has a PMH of peripheral neuropathy, seizures, catatonia, anxiety, HTN, and fibromyalgia. Pt was previously admitted on 12/13 thru 12/28 with toxic metabolic encephalopathy.  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  EEG showed no recurrent seizures. A 24 hour EEG  showed an intermittent delta pattern felt to represent Frontal Intermittent Rhythmic Delta Activity. Pt was started on two antiepileptics.  This admission, she was again found altered and seemed "comatose" . Since d/c in December, pt has had persistent confusion and is confined to a wheelchair at [facility]. She is currently confused and experiencing visual hallucinations.  Subjective: Patient is more alert, communicating. Denies any pain.Has been vomiting since yesterday , tube feeds have now been on hold.  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 -Pt received diatech catheter placement and plasmapheresis on 06/20/13.   -Plan is for plasmapheresis every other day and Solumedrol 500 mg bid x 3 days. D/C on 1/24. -MRI of the brain showed no acute abnormality.  Detailed results listed below. -Ammonia level is normal - CT chest and transvaginal ultrasound showed no abnormalities. -[facility] paraneoplastic panel reported to be negative, MRI of the C-spine is negative for acute findings  Pending:   - serum NMDA.   Tachycardia -Resolved,  after patient was started back on metoprolol  Hx of Convulsions/Seizures  -Pt has a PMH of seizures and catatonia.   -MRI of the brain showed thalamic signal abnormality during previous admission.  No longer present on current MRI. -Pt currently taking Keppra and Depakene.  Increased Depakene to 500mg   tid -Valproic acid lvl 35.1 was low.  Repeat Depakote level is 76.6  Hypokalemia Potassium replaced Will check the BMP   Nausea&Vomiting Started after tube feeds was initiated Tuber feeds held overnight as per surgery recommendation. Continue zofran, phenergan prn.   Hx of Anxiety  -Continue pt on Remeron -Patient appears to have had a psychiatric history  -Current illness is bringing out psychiatric symptoms. -Psychiatry consulted as psychotropic medications may help with her current symptoms. -Psych prescribed Risperdal .5 mg bid  Hypertension  -Pt has a PMH of HTN and most recent BP reading was 151/108 -Likely secondary to anxiety and high dose steroids. Continue metoprolol  Malnutrition with history of Gastric Bypass -Per SNF patient has had no PO intake for over 1 week prior to admission. -SNF sent her to hospital for J tube placement. -Gen Surg consulted, J-tube placed  Folate deficiency -Replete parenterally.  Recent hx of c-diff (05/18/13)  -Pt negative for C-diff at this admit  Thrombocytopenia -Discontinue heparin, seems to be chronic up and down, could be secondary to folate deficiency.  Enterovaginal fistula -Reported prior to that this patient had some stools in her vagina. -Discussed with radiology, no evidence of rectovaginal fistula and the previous imaging, (patient had indicated pelvic scan done previously). - Consulted GI, and the did not feel that patient will benefit from colonoscopy for the diagnosis of fistula. - Called and discussed with Gyn on-call, and they recommend to follow the patient as outpatient. No further workup required in the hospital.  DVT Prophylaxis:  SCD  Code Status: Full Code Family Communication: Son was contacted to explain procedures and status Disposition Plan: D/C to SNF when medically appropriate.   Consultants:  Psych  Neuro  Procedures:  Plasmapheresis  PICC Line  Placement  Antibiotics:  None  Objective: Filed Vitals:   06/26/13 2333 06/27/13 0000 06/27/13 0100 06/27/13 0500  BP: 119/79     Pulse: 129  119   Temp:  98.5 F (36.9 C)    TempSrc:  Oral    Resp: 26  25   Height:      Weight:    73.6 kg (162 lb 4.1 oz)  SpO2: 100%  100%     Intake/Output Summary (Last 24 hours) at 06/27/13 0508 Last data filed at 06/27/13 0200  Gross per 24 hour  Intake   1564 ml  Output      1 ml  Net   1563 ml   Filed Weights   06/25/13 0330 06/26/13 0422 06/27/13 0500  Weight: 73.7 kg (162 lb 7.7 oz) 73.4 kg (161 lb 13.1 oz) 73.6 kg (162 lb 4.1 oz)    Exam: Physical Exam: Head: Normocephalic, atraumatic.  Eyes: No signs of jaundice, EOMI Nose: Mucous membranes dry.  Throat: Oropharynx nonerythematous, no exudate appreciated.  Neck: supple,No deformities, masses, or tenderness noted. Lungs: Normal respiratory effort. B/L Clear to auscultation, no crackles or wheezes.  Heart: Regular RR. S1 and S2 normal  Abdomen: BS normoactive. Soft, Nondistended, non-tender.  Extremities: No pretibial edema, no erythema      Data Reviewed: Basic Metabolic Panel:  Recent Labs Lab 06/22/13 0500  06/23/13 0455 06/24/13 0503 06/25/13 0330 06/26/13 0330 06/26/13 1354  NA 143  < > 145 149* 150* 145 143  K 4.0  < > 3.9 2.8* 3.2* 3.3* 3.0*  CL 109  < > 114* 117* 122* 112 105  CO2 21  --  19 19 17* 20  --   GLUCOSE 115*  < > 121* 95 90 100* 134*  BUN 10  < > 12 15 9  4* <3*  CREATININE 0.66  < > 0.60 0.63 0.60 0.58 0.60  CALCIUM 8.8  --  8.8 8.1* 7.9* 8.1*  --   < > = values in this interval not displayed. Liver Function Tests:  Recent Labs Lab 06/20/13 0515  AST 25  ALT 9  ALKPHOS 38*  BILITOT 0.4  PROT 6.3  ALBUMIN 2.7*   No results found for this basename: AMMONIA,  in the last 168 hours CBC:  Recent Labs Lab 06/22/13 0500  06/23/13 0455 06/24/13 0503 06/25/13 0330 06/26/13 0330 06/26/13 1354  WBC 6.1  --  4.0 4.2 3.8* 6.1  --    HGB 10.6*  < > 10.2* 8.6* 7.9* 7.5* 7.5*  HCT 31.9*  < > 30.6* 25.9* 23.4* 22.3* 22.0*  MCV 93.8  --  94.2 94.9 95.5 94.5  --   PLT 101*  --  72* 59* 46* 72*  --   < > = values in this interval not displayed. CBG:  Recent Labs Lab 06/26/13 0719 06/26/13 1141 06/26/13 1543 06/26/13 1949 06/26/13 2334  GLUCAP 108* 111* 119* 126* 118*    Recent Results (from the past 240 hour(s))  CLOSTRIDIUM DIFFICILE BY PCR     Status: None   Collection Time    06/19/13  5:14 PM      Result Value Range Status   C difficile by pcr NEGATIVE  NEGATIVE Final  MRSA PCR SCREENING     Status: None   Collection Time    06/19/13  5:14 PM      Result Value Range Status   MRSA by PCR NEGATIVE  NEGATIVE Final   Comment:            The GeneXpert MRSA Assay (FDA     approved for NASAL specimens     only), is one component of a     comprehensive MRSA colonization     surveillance program. It is not     intended to diagnose MRSA     infection nor to guide or     monitor treatment for     MRSA infections.  STOOL CULTURE     Status: None   Collection Time    06/19/13  5:14 PM      Result Value Range Status   Specimen Description STOOL   Final   Special Requests NONE   Final   Culture     Final   Value: NO SALMONELLA, SHIGELLA, CAMPYLOBACTER, YERSINIA, OR E.COLI 0157:H7 ISOLATED     Performed at [facility]   Report Status 06/23/2013 FINAL   Final  CULTURE, BLOOD (ROUTINE X 2)     Status: None   Collection Time    06/23/13  2:02 PM      Result Value Range Status   Specimen Description BLOOD LEFT ARM   Final   Special Requests BOTTLES DRAWN AEROBIC ONLY 10CC   Final   Culture  Setup Time     Final   Value: 06/23/2013 19:55     Performed at [facility]   Culture     Final   Value:        BLOOD CULTURE RECEIVED NO GROWTH TO DATE CULTURE WILL BE HELD FOR 5 DAYS BEFORE ISSUING A FINAL NEGATIVE REPORT     Performed at [facility]   Report Status PENDING   Incomplete   CULTURE, BLOOD (ROUTINE X 2)     Status: None   Collection Time    06/23/13  2:14 PM      Result Value Range Status   Specimen Description BLOOD LEFT HAND   Final   Special Requests BOTTLES DRAWN AEROBIC ONLY 5.5 CC   Final   Culture  Setup Time     Final   Value: 06/23/2013 19:55     Performed at [facility]   Culture     Final   Value:        BLOOD CULTURE RECEIVED NO GROWTH TO DATE CULTURE WILL BE HELD FOR 5 DAYS BEFORE ISSUING A FINAL NEGATIVE REPORT     Performed at [facility]   Report Status PENDING   Incomplete  CLOSTRIDIUM DIFFICILE BY PCR     Status: None   Collection Time    06/23/13  5:08 PM      Result Value Range Status   C difficile by pcr NEGATIVE  NEGATIVE Final  URINE CULTURE     Status: None   Collection Time    06/23/13  6:15 PM      Result Value Range Status   Specimen Description URINE, CLEAN CATCH   Final   Special Requests NONE   Final   Culture  Setup Time     Final   Value: 06/24/2013 01:22     Performed at [facility] Count     Final   Value: NO GROWTH     Performed at [facility]   Culture     Final   Value: NO GROWTH     Performed at [facility]   Report Status  06/24/2013 FINAL   Final     Studies: No results found.  Scheduled Meds: . divalproex  500 mg Oral Q8H  . levETIRAcetam  1,000 mg Oral BID  . metoprolol tartrate  50 mg Oral BID  . mirtazapine  30 mg Oral QHS  . multivitamin with minerals  1 tablet Oral Daily  . risperiDONE  0.5 mg Oral BID  . thiamine  100 mg Oral Daily   Continuous Infusions: . citrate dextrose    . dextrose 5 % and 0.45 % NaCl with KCl 20 mEq/L 75 mL/hr at 06/27/13 0119  . feeding supplement (OSMOLITE 1.5 CAL) Stopped (06/27/13 0045)    Principal Problem:   Acute encephalopathy Active Problems:   Convulsions/seizures   Toxic metabolic encephalopathy   Psychosis    Janaya Broy S [facility] Pager [phone]. If 7PM-7AM, please contact  night-coverage at [email or web address], password [email or web address] 06/27/2013, 5:08 AM  LOS: 8 days

## 2013-06-27 NOTE — Progress Notes (Signed)
2 Days Post-Op  Subjective: Had nausea and vomiting overnight, still nauseated.  Began after TF were initiated.  Not sure if the patient is taking in anything by mouth.    Objective: Vital signs in last 24 hours: Temp:  [97.6 F (36.4 C)-100 F (37.8 C)] 98.5 F (36.9 C) (01/29 0804) Pulse Rate:  [102-138] 126 (01/29 0804) Resp:  [15-30] 26 (01/29 0804) BP: (107-138)/(43-79) 137/65 mmHg (01/29 0804) SpO2:  [100 %] 100 % (01/29 0804) Weight:  [162 lb 4.1 oz (73.6 kg)] 162 lb 4.1 oz (73.6 kg) (01/29 0500) Last BM Date: 06/26/13  Intake/Output from previous day: 01/28 0701 - 01/29 0700 In: 1664 [I.V.:1472.5; NG/GT:161.5] Out: 1 [Emesis/NG output:1] Intake/Output this shift: Total I/O In: 155 [I.V.:155] Out: -   PE GI: +bs, abdomen is soft appropriately tender.  midline incision, staples intact, minimal serosanguinous drainage.  Left side J tube.    Lab Results:   Recent Labs  06/26/13 0330 06/26/13 1354 06/27/13 0530  WBC 6.1  --  8.4  HGB 7.5* 7.5* 7.2*  HCT 22.3* 22.0* 20.9*  PLT 72*  --  60*   BMET  Recent Labs  06/26/13 0330 06/26/13 1354 06/27/13 0530  NA 145 143 143  K 3.3* 3.0* 3.2*  CL 112 105 110  CO2 20  --  19  GLUCOSE 100* 134* 133*  BUN 4* <3* <3*  CREATININE 0.58 0.60 0.48*  CALCIUM 8.1*  --  8.1*    Anti-infectives: Anti-infectives   Start     Dose/Rate Route Frequency Ordered Stop   06/23/13 1400  piperacillin-tazobactam (ZOSYN) IVPB 3.375 g  Status:  Discontinued     3.375 g 12.5 mL/hr over 240 Minutes Intravenous 3 times per day 06/23/13 1336 06/26/13 1441   06/20/13 1200  vancomycin (VANCOCIN) IVPB 1000 mg/200 mL premix     1,000 mg 200 mL/hr over 60 Minutes Intravenous  Once 06/20/13 1146 06/20/13 1433      Assessment/Plan: S/p Open Jejunostomy feeding tube Dr. Lindie SpruceWyatt 06/25/13 POD #2 Hold tube feeds and make NPO for now, although it is a J tube and she should not have residuals, not sure if she is taking anything by mouth.   Obtain KUB. -anti-emetics -wound open to air -will follow   Questionable rectovaginal fistula -[facility] care everywhere.  CT of pelvis 1/23 does not show a fistula.  [facility]   LOS: 8 days    Staton Markey  ANP-BC  06/27/2013 9:02 AM

## 2013-06-27 NOTE — Progress Notes (Signed)
Utilization review completed.  

## 2013-06-27 NOTE — Progress Notes (Signed)
Pt had a large episode of bile colored emesis. Pt states her stomach feels upset and she is still nauseated. Tube feed pauses. MD Carolynne Edouardoth made aware, new orders for freq of zofran and an addition of phenergan if that is ineffective. Order for tube feeds to be held for the night also given. Will continue to monitor.

## 2013-06-27 NOTE — Progress Notes (Signed)
NUTRITION FOLLOW-UP  DOCUMENTATION CODES Per approved criteria  -Not Applicable   INTERVENTION: Resume Osmolite 1.5 @ 10 mL/hr.  Once ready to advance, per surgery, recommend advancement of Osmolite 1.5 by 10 ml q 12 hours to goal rate of 45 ml/hr. Goal rate will provide: 1620 kcal, 68 grams protein, 823 ml free water. *Pt would benefit from slow advancement and close monitoring of electrolytes given refeeding syndrome risk.* Monitor magnesium, potassium, and phosphorus daily for at least 3 days, MD to replete as needed, as pt is at risk for refeeding syndrome given prolonged poor oral intake.  RD to continue to follow nutrition care plan.  NUTRITION DIAGNOSIS: Inadequate oral intake related to AMS as evidenced by limited oral intake.  Ongoing.  Goal: Intake to meet >90% of estimated nutrition needs. Not met yet.  Monitor:  weight trends, lab trends, I/O's, TF tolerance  ASSESSMENT: PMHx significant for catatonia, HTN, anxiety, gastric bypass. From SNF.  Recent admission from 12/13 - 12/28 with toxic metabolic encephalopathy, hospital course was complicated with pyelonephritis and C. Difficile colitis. Pt was scheduled for PEG on 1/20, was found to be agitated and confused and was sent to ED.   Neuro saw pt on 1/21 and recommended plasma exchanges every other day as well as suppressive steroids to reduce her seizure foci.  Per CSW note from [place], SW at [facility] reported that patient has not eaten since she was admitted to the facility. (PA contacted SNF and they confirmed that pt has had NO intake for >1 week PTA.)  SW stated that the facility tried to get a PEG, but pt has a hx of gastric bypass, and SW was wondering if pt could get a J-tube during this admission.   Pt developed frequent amount of large stooling from vagina on 1/25 and was sent to SDU.   Underwent J-tube placement 1/27. Started Osmolite 1.5 at 10 ml/hr (1/28). Pt developed nausea yesterday AM.  Anti-emetics were  not effective and pt developed nausea/vomiting yesterday evening. Feeds were re-attempted overnight without success and have since been held.   TFs largely described as bile-like.  Last emesis was this AM well after TFs had been held. Pt with h/o gastric bypass which complicates anatomy.  Discussed with Dr. Hulen Skains.  Plan to resume TFs and consider UGI for source of emesis.   Potassium remains low at 3.3. Prealbumin is low at 15.8.  Height: Ht Readings from Last 1 Encounters:  06/20/13 5' 3"  (1.6 m)    Weight: Wt Readings from Last 1 Encounters:  06/27/13 162 lb 4.1 oz (73.6 kg)  Admit wt 152 lb  BMI:  Body mass index is 28.75 kg/(m^2). Overweight  Estimated Nutritional Needs: Kcal: 1600 - 1800 Protein: 75 - 90 g Fluid: 1.8 - 2 liters  Skin: intact  Diet Order: NPO   EDUCATION NEEDS: -No education needs identified at this time   Intake/Output Summary (Last 24 hours) at 06/27/13 1324 Last data filed at 06/27/13 0804  Gross per 24 hour  Intake   1819 ml  Output      0 ml  Net   1819 ml    Last BM: 1/28  Labs:   Recent Labs Lab 06/25/13 0330 06/26/13 0330 06/26/13 1354 06/27/13 0530  NA 150* 145 143 143  K 3.2* 3.3* 3.0* 3.2*  CL 122* 112 105 110  CO2 17* 20  --  19  BUN 9 4* <3* <3*  CREATININE 0.60 0.58 0.60 0.48*  CALCIUM 7.9* 8.1*  --  8.1*  GLUCOSE 90 100* 134* 133*    CBG (last 3)   Recent Labs  06/27/13 0611 06/27/13 0731 06/27/13 1156  GLUCAP 101* 119* 121*   Prealbumin  Date/Time Value Range Status  06/24/2013  4:30 PM 11.7* 17.0 - 34.0 mg/dL Final     Performed at [facility]    Scheduled Meds: . levETIRAcetam  1,000 mg Intravenous Q12H  . metoprolol  5 mg Intravenous Q6H  . mirtazapine  30 mg Oral QHS  . multivitamin with minerals  1 tablet Oral Daily  . risperiDONE  0.5 mg Oral BID  . thiamine  100 mg Oral Daily  . valproate sodium  500 mg Intravenous Q8H    Continuous Infusions: . citrate dextrose    . dextrose 5  % and 0.45 % NaCl with KCl 20 mEq/L 75 mL/hr at 06/27/13 0119  . feeding supplement (OSMOLITE 1.5 CAL) Stopped (06/27/13 0045)    Brynda Greathouse, MS RD LDN Clinical Inpatient Dietitian Pager: [phone] Weekend/After hours pager: [phone]

## 2013-06-28 ENCOUNTER — Inpatient Hospital Stay (HOSPITAL_COMMUNITY): Payer: Medicare Other

## 2013-06-28 ENCOUNTER — Institutional Professional Consult (permissible substitution): Payer: Self-pay | Admitting: Neurology

## 2013-06-28 LAB — CBC
HCT: 18.5 % — ABNORMAL LOW (ref 36.0–46.0)
HEMATOCRIT: 27.1 % — AB (ref 36.0–46.0)
Hemoglobin: 6.4 g/dL — CL (ref 12.0–15.0)
Hemoglobin: 9.3 g/dL — ABNORMAL LOW (ref 12.0–15.0)
MCH: 33 pg (ref 26.0–34.0)
MCH: 31.3 pg (ref 26.0–34.0)
MCHC: 34.6 g/dL (ref 30.0–36.0)
MCHC: 34.3 g/dL (ref 30.0–36.0)
MCV: 95.4 fL (ref 78.0–100.0)
MCV: 91.2 fL (ref 78.0–100.0)
PLATELETS: 73 10*3/uL — AB (ref 150–400)
Platelets: 71 10*3/uL — ABNORMAL LOW (ref 150–400)
RBC: 1.94 MIL/uL — ABNORMAL LOW (ref 3.87–5.11)
RBC: 2.97 MIL/uL — ABNORMAL LOW (ref 3.87–5.11)
RDW: 17.6 % — AB (ref 11.5–15.5)
RDW: 18.6 % — AB (ref 11.5–15.5)
WBC: 8.2 10*3/uL (ref 4.0–10.5)
WBC: 6.9 10*3/uL (ref 4.0–10.5)

## 2013-06-28 LAB — POCT I-STAT, CHEM 8
BUN: <3 mg/dL — ABNORMAL LOW (ref 6–23)
CALCIUM ION: 1.23 mmol/L (ref 1.12–1.23)
CHLORIDE: 105 meq/L (ref 96–112)
Creatinine, Ser: 0.6 mg/dL (ref 0.50–1.10)
GLUCOSE: 102 mg/dL — AB (ref 70–99)
HEMATOCRIT: 27 % — AB (ref 36.0–46.0)
HEMOGLOBIN: 9.2 g/dL — AB (ref 12.0–15.0)
Potassium: 3 mEq/L — ABNORMAL LOW (ref 3.7–5.3)
Sodium: 146 mEq/L (ref 137–147)
TCO2: 21 mmol/L (ref 0–100)

## 2013-06-28 LAB — BASIC METABOLIC PANEL
CALCIUM: 8 mg/dL — AB (ref 8.4–10.5)
CHLORIDE: 110 meq/L (ref 96–112)
CO2: 21 meq/L (ref 19–32)
CREATININE: 0.52 mg/dL (ref 0.50–1.10)
GFR calc non Af Amer: >90 mL/min (ref >90)
Glucose, Bld: 90 mg/dL (ref 70–99)
Potassium: 3.2 mEq/L — ABNORMAL LOW (ref 3.7–5.3)
Sodium: 144 mEq/L (ref 137–147)

## 2013-06-28 LAB — GLUCOSE, CAPILLARY
GLUCOSE-CAPILLARY: 98 mg/dL (ref 70–99)
GLUCOSE-CAPILLARY: 86 mg/dL (ref 70–99)
Glucose-Capillary: 109 mg/dL — ABNORMAL HIGH (ref 70–99)
Glucose-Capillary: 109 mg/dL — ABNORMAL HIGH (ref 70–99)

## 2013-06-28 LAB — PREPARE RBC (CROSSMATCH)

## 2013-06-28 MED ORDER — ACD FORMULA A 0.73-2.45-2.2 GM/100ML VI SOLN: 500.0000 mL | Status: DC

## 2013-06-28 MED ORDER — HEPARIN SODIUM (PORCINE) 1000 UNIT/ML IJ SOLN
1000.0000 [IU] | Freq: Once | INTRAMUSCULAR | Status: DC
  Filled 2013-06-28: qty 1

## 2013-06-28 MED ORDER — CALCIUM CARBONATE ANTACID 500 MG PO CHEW: 2.0000 | CHEWABLE_TABLET | ORAL | Status: DC

## 2013-06-28 MED ORDER — ACETAMINOPHEN 325 MG PO TABS: 650.0000 mg | ORAL_TABLET | ORAL | Status: DC | PRN

## 2013-06-28 MED ORDER — DIPHENHYDRAMINE HCL 25 MG PO CAPS: 25.0000 mg | ORAL_CAPSULE | Freq: Four times a day (QID) | ORAL | Status: DC | PRN

## 2013-06-28 MED ORDER — CALCIUM GLUCONATE 10 % IV SOLN
4.0000 g | Freq: Once | INTRAVENOUS | Status: AC
  Administered 2013-06-28: 4 g via INTRAVENOUS
  Filled 2013-06-28: qty 40

## 2013-06-28 MED ORDER — CALCIUM CARBONATE ANTACID 500 MG PO CHEW
2.0000 | CHEWABLE_TABLET | ORAL | Status: DC
  Filled 2013-06-28 (×2): qty 2

## 2013-06-28 MED ORDER — SODIUM CHLORIDE 0.9 % IV SOLN
4.0000 g | Freq: Once | INTRAVENOUS | Status: DC
  Filled 2013-06-28: qty 40

## 2013-06-28 MED ORDER — POTASSIUM CHLORIDE 10 MEQ/100ML IV SOLN: 10.0000 meq | INTRAVENOUS | Status: AC

## 2013-06-28 MED ORDER — ALBUMIN HUMAN 25 % IV SOLN
INTRAVENOUS | Status: DC
  Filled 2013-06-28 (×3): qty 200

## 2013-06-28 MED ORDER — ALBUMIN HUMAN 25 % IV SOLN
INTRAVENOUS | Status: AC
  Filled 2013-06-28 (×3): qty 200

## 2013-06-28 MED ORDER — ACD FORMULA A 0.73-2.45-2.2 GM/100ML VI SOLN
Status: AC
  Filled 2013-06-28: qty 500

## 2013-06-28 MED ORDER — SODIUM CHLORIDE 0.9 % IV SOLN
INTRAVENOUS | Status: DC
  Administered 2013-06-28 (×2): via INTRAVENOUS_CENTRAL
  Filled 2013-06-28 (×3): qty 200

## 2013-06-28 MED ORDER — ACD FORMULA A 0.73-2.45-2.2 GM/100ML VI SOLN
500.0000 mL | Status: DC
  Filled 2013-06-28: qty 500

## 2013-06-28 MED ORDER — IOHEXOL 300 MG/ML  SOLN: 150.0000 mL | Freq: Once | INTRAMUSCULAR | Status: AC | PRN

## 2013-06-28 MED ORDER — POTASSIUM CHLORIDE 10 MEQ/100ML IV SOLN: 10.0000 meq | INTRAVENOUS | Status: DC

## 2013-06-28 MED ORDER — ACD FORMULA A 0.73-2.45-2.2 GM/100ML VI SOLN
Status: AC
  Administered 2013-06-28: 500 mL
  Filled 2013-06-28: qty 500

## 2013-06-28 MED ORDER — HEPARIN SODIUM (PORCINE) 1000 UNIT/ML IJ SOLN: 1000.0000 [IU] | Freq: Once | INTRAMUSCULAR | Status: DC

## 2013-06-28 NOTE — Progress Notes (Signed)
CRITICAL VALUE ALERT  Critical value received:  Hgb=6.4  Date of notification:  06/28/13  Time of notification:  0645  Critical value read back:yes  Nurse who received alert:  Marcelino Campos   MD notified (1st page):  [place] (text page sent in [place]  Time of first page:  0710  MD notified (2nd page): N/A  Time of second page: N/A  Responding MD:  Sharl MaLama  Time MD responded:  [phone]

## 2013-06-28 NOTE — Progress Notes (Signed)
Dialysis called. Blood completed at 1700. CBC draw at 1800, once results populate can come for plasmapheresis.

## 2013-06-28 NOTE — Progress Notes (Addendum)
Report called to HD for PF. Given to [place], Charity fundraiser[place]. Hct 27.1, Hgb 9.3, Plt 73. VSS. TF infusing in LLQ J-tube. NG in R nare to LIS, continue. R PICC infusing D51/2NS+20K @ 75. 2 units PRBC infused. HD will call when ready to pick up patient.

## 2013-06-28 NOTE — Progress Notes (Signed)
Did not tolerate SBFT yet.  Will check later today.  Marta LamasJames O. Gae BonWyatt, [name], MD, FACS [phone]--pager [phone]--office [facility]

## 2013-06-28 NOTE — Progress Notes (Signed)
NUTRITION FOLLOW-UP  DOCUMENTATION CODES Per approved criteria  -Not Applicable   INTERVENTION: Once ready to resume enteral nutrition, per surgery, recommend resuming Osmolite 1.5 at 10 ml/hr. When ready to advance, recommend advancement of Osmolite 1.5 by 10 ml q 12 hours to goal rate of 45 ml/hr. Goal rate will provide: 1620 kcal, 68 grams protein, 823 ml free water. *Pt would benefit from slow advancement and close monitoring of electrolytes given refeeding syndrome risk.* Monitor magnesium, potassium, and phosphorus daily for at least 3 days, MD to replete as needed, as pt is at risk for refeeding syndrome given prolonged poor oral intake.  RD to continue to follow nutrition care plan.  NUTRITION DIAGNOSIS: Inadequate oral intake related to AMS as evidenced by limited oral intake.  Ongoing.  Goal: Intake to meet >90% of estimated nutrition needs. Not met yet.  Monitor:  weight trends, lab trends, I/O's, TF tolerance and adequacy  ASSESSMENT: PMHx significant for catatonia, HTN, anxiety, gastric bypass. From SNF.  Recent admission from 12/13 - 12/28 with toxic metabolic encephalopathy, hospital course was complicated with pyelonephritis and C. Difficile colitis. Pt was scheduled for PEG on 1/20, was found to be agitated and confused and was sent to ED.   Neuro saw pt on 1/21 and recommended plasma exchanges every other day as well as suppressive steroids to reduce her seizure foci.  Per CSW note from [place], SW at [facility] reported that patient has not eaten since she was admitted to the facility. (PA contacted SNF and they confirmed that pt has had NO intake for >1 week PTA.)  SW stated that the facility tried to get a PEG, but pt has a hx of gastric bypass, and SW was wondering if pt could get a J-tube during this admission.   Pt developed frequent amount of large stooling from vagina on 1/25 and was sent to SDU.   Underwent J-tube placement 1/27. Started Osmolite 1.5 at 10 ml/hr  (1/28). Pt developed vomiting after feedings were started and anti-emetics were not effective. Feeds were re-attempted overnight. NGT placed 1/30 - pt with 450 ml of bilious output so far today, plan to resume TF once NGT output has no TF-like substance in it, per surgery. Pt is currently off unit for UGI series to evaluate J-tube site, possibly "kinked" per surgery.  Potassium remains low at 3.2. Ordered for KCl. Prealbumin is low at 15.8.  Height: Ht Readings from Last 1 Encounters:  06/20/13 5' 3"  (1.6 m)    Weight: Wt Readings from Last 1 Encounters:  06/28/13 166 lb 0.1 oz (75.3 kg)  Admit wt 152 lb  BMI:  Body mass index is 29.41 kg/(m^2). Overweight  Estimated Nutritional Needs: Kcal: 1600 - 1800 Protein: 75 - 90 g Fluid: 1.8 - 2 liters  Skin: intact  Diet Order: NPO   EDUCATION NEEDS: -No education needs identified at this time   Intake/Output Summary (Last 24 hours) at 06/28/13 1143 Last data filed at 06/28/13 1100  Gross per 24 hour  Intake 2607.5 ml  Output    250 ml  Net 2357.5 ml    Last BM: 1/28  Labs:   Recent Labs Lab 06/27/13 0530 06/27/13 1640 06/28/13 0550  NA 143 145 144  K 3.2* 3.3* 3.2*  CL 110 111 110  CO2 19 20 21   BUN <3* <3* <3*  CREATININE 0.48* 0.52 0.52  CALCIUM 8.1* 8.0* 8.0*  GLUCOSE 133* 116* 90    CBG (last 3)   Recent Labs  06/27/13 2016 06/27/13 2327 06/28/13 0349  GLUCAP 109* 109* 98   Prealbumin  Date/Time Value Range Status  06/24/2013  4:30 PM 11.7* 17.0 - 34.0 mg/dL Final     Performed at [facility]    Scheduled Meds: . therapeutic plasma exchange solution   Dialysis Q1 Hr x 3  . calcium gluconate IVPB  4 g Intravenous Once  . citrate dextrose      . levETIRAcetam  1,000 mg Intravenous Q12H  . metoprolol  5 mg Intravenous Q6H  . mirtazapine  30 mg Oral QHS  . multivitamin with minerals  1 tablet Oral Daily  . potassium chloride  10 mEq Intravenous Q1 Hr x 3  . risperiDONE  0.5 mg Oral  BID  . thiamine  100 mg Oral Daily  . valproate sodium  500 mg Intravenous Q8H    Continuous Infusions: . citrate dextrose    . dextrose 5 % and 0.45 % NaCl with KCl 20 mEq/L 75 mL/hr at 06/28/13 0559  . feeding supplement (OSMOLITE 1.5 CAL) Stopped (06/27/13 0045)    Inda Coke MS, RD, LDN Pager: [phone] After-hours pager: [phone]

## 2013-06-28 NOTE — Progress Notes (Signed)
Output from right nare noted on cannister from time inserted on 01/29 to 0800 1/30 to be 450, green.

## 2013-06-28 NOTE — Progress Notes (Signed)
Patient ID: Joan [name], female   DOB: 16-May-1961, 53 y.o.   MRN: [id number]   Subjective: No output recorded, has approximately 551m of bilious output.  Pt states nausea has resolved with NGT placement.    Objective:  Vital signs:  Filed Vitals:   06/27/13 1622 06/27/13 2000 06/28/13 0000 06/28/13 0400  BP: 133/72 146/72 147/78 134/81  Pulse: 131 127 135 131  Temp: 97.8 F (36.6 C) 98.3 F (36.8 C) 97.4 F (36.3 C) 97.5 F (36.4 C)  TempSrc: Oral Oral Oral Oral  Resp: 20 18 19 9   Height:      Weight:      SpO2: 100% 100% 100% 100%    Last BM Date: 06/26/13  Intake/Output   Yesterday:  01/29 0701 - 01/30 0700 In: 1140 [I.V.:975; IV Piggyback:165] Out: -   PE  GI: +bs, abdomen is soft appropriately tender. midline incision, staples intact, no drainage. Left side J tube.     Problem List:   Principal Problem:   Acute encephalopathy Active Problems:   Convulsions/seizures   Toxic metabolic encephalopathy   Psychosis   Nausea with vomiting    Results:   Labs: Results for orders placed during the hospital encounter of 06/19/13 (from the past 48 hour(s))  GLUCOSE, CAPILLARY     Status: Abnormal   Collection Time    06/26/13 11:41 AM      Result Value Range   Glucose-Capillary 111 (*) 70 - 99 mg/dL   Comment 1 Notify RN     Comment 2 Documented in Chart    POCT I-STAT, CHEM 8     Status: Abnormal   Collection Time    06/26/13  1:54 PM      Result Value Range   Sodium 143  137 - 147 mEq/L   Potassium 3.0 (*) 3.7 - 5.3 mEq/L   Chloride 105  96 - 112 mEq/L   BUN <3 (*) 6 - 23 mg/dL   Creatinine, Ser 0.60  0.50 - 1.10 mg/dL   Glucose, Bld 134 (*) 70 - 99 mg/dL   Calcium, Ion 1.21  1.12 - 1.23 mmol/L   TCO2 22  0 - 100 mmol/L   Hemoglobin 7.5 (*) 12.0 - 15.0 g/dL   HCT 22.0 (*) 36.0 - 46.0 %  GLUCOSE, CAPILLARY     Status: Abnormal   Collection Time    06/26/13  3:43 PM      Result Value Range   Glucose-Capillary 119 (*) 70 - 99 mg/dL   Comment 1  Notify RN     Comment 2 Documented in Chart    GLUCOSE, CAPILLARY     Status: Abnormal   Collection Time    06/26/13  7:49 PM      Result Value Range   Glucose-Capillary 126 (*) 70 - 99 mg/dL   Comment 1 Notify RN     Comment 2 Documented in Chart    GLUCOSE, CAPILLARY     Status: Abnormal   Collection Time    06/26/13 11:34 PM      Result Value Range   Glucose-Capillary 118 (*) 70 - 99 mg/dL   Comment 1 Notify RN     Comment 2 Documented in Chart    GLUCOSE, CAPILLARY     Status: Abnormal   Collection Time    06/27/13  3:43 AM      Result Value Range   Glucose-Capillary 120 (*) 70 - 99 mg/dL   Comment 1 Notify RN  Comment 2 Documented in Chart    BASIC METABOLIC PANEL     Status: Abnormal   Collection Time    06/27/13  5:30 AM      Result Value Range   Sodium 143  137 - 147 mEq/L   Potassium 3.2 (*) 3.7 - 5.3 mEq/L   Chloride 110  96 - 112 mEq/L   CO2 19  19 - 32 mEq/L   Glucose, Bld 133 (*) 70 - 99 mg/dL   BUN <3 (*) 6 - 23 mg/dL   Creatinine, Ser 0.48 (*) 0.50 - 1.10 mg/dL   Calcium 8.1 (*) 8.4 - 10.5 mg/dL   GFR calc non Af Amer >90  >90 mL/min   GFR calc Af Amer >90  >90 mL/min   Comment: (NOTE)     The eGFR has been calculated using the CKD EPI equation.     This calculation has not been validated in all clinical situations.     eGFR's persistently <90 mL/min signify possible Chronic Kidney     Disease.  CBC     Status: Abnormal   Collection Time    06/27/13  5:30 AM      Result Value Range   WBC 8.4  4.0 - 10.5 K/uL   RBC 2.23 (*) 3.87 - 5.11 MIL/uL   Hemoglobin 7.2 (*) 12.0 - 15.0 g/dL   HCT 20.9 (*) 36.0 - 46.0 %   MCV 93.7  78.0 - 100.0 fL   MCH 32.3  26.0 - 34.0 pg   MCHC 34.4  30.0 - 36.0 g/dL   RDW 16.9 (*) 11.5 - 15.5 %   Platelets 60 (*) 150 - 400 K/uL   Comment: CONSISTENT WITH PREVIOUS RESULT  GLUCOSE, CAPILLARY     Status: Abnormal   Collection Time    06/27/13  6:11 AM      Result Value Range   Glucose-Capillary 101 (*) 70 - 99 mg/dL    Comment 1 Notify RN     Comment 2 Documented in Chart    GLUCOSE, CAPILLARY     Status: Abnormal   Collection Time    06/27/13  7:31 AM      Result Value Range   Glucose-Capillary 119 (*) 70 - 99 mg/dL  GLUCOSE, CAPILLARY     Status: Abnormal   Collection Time    06/27/13 11:56 AM      Result Value Range   Glucose-Capillary 121 (*) 70 - 99 mg/dL  GLUCOSE, CAPILLARY     Status: Abnormal   Collection Time    06/27/13  4:26 PM      Result Value Range   Glucose-Capillary 123 (*) 70 - 99 mg/dL   Comment 1 Notify RN    COMPREHENSIVE METABOLIC PANEL     Status: Abnormal   Collection Time    06/27/13  4:40 PM      Result Value Range   Sodium 145  137 - 147 mEq/L   Potassium 3.3 (*) 3.7 - 5.3 mEq/L   Chloride 111  96 - 112 mEq/L   CO2 20  19 - 32 mEq/L   Glucose, Bld 116 (*) 70 - 99 mg/dL   BUN <3 (*) 6 - 23 mg/dL   Creatinine, Ser 0.52  0.50 - 1.10 mg/dL   Calcium 8.0 (*) 8.4 - 10.5 mg/dL   Total Protein 4.6 (*) 6.0 - 8.3 g/dL   Albumin 3.1 (*) 3.5 - 5.2 g/dL   AST 12  0 - 37 U/L  ALT 9  0 - 35 U/L   Alkaline Phosphatase 16 (*) 39 - 117 U/L   Total Bilirubin 0.7  0.3 - 1.2 mg/dL   GFR calc non Af Amer >90  >90 mL/min   GFR calc Af Amer >90  >90 mL/min   Comment: (NOTE)     The eGFR has been calculated using the CKD EPI equation.     This calculation has not been validated in all clinical situations.     eGFR's persistently <90 mL/min signify possible Chronic Kidney     Disease.  GLUCOSE, CAPILLARY     Status: Abnormal   Collection Time    06/27/13  8:16 PM      Result Value Range   Glucose-Capillary 109 (*) 70 - 99 mg/dL   Comment 1 Notify RN     Comment 2 Documented in Chart    GLUCOSE, CAPILLARY     Status: Abnormal   Collection Time    06/27/13 11:27 PM      Result Value Range   Glucose-Capillary 109 (*) 70 - 99 mg/dL   Comment 1 Notify RN     Comment 2 Documented in Chart    GLUCOSE, CAPILLARY     Status: None   Collection Time    06/28/13  3:49 AM       Result Value Range   Glucose-Capillary 98  70 - 99 mg/dL   Comment 1 Notify RN     Comment 2 Documented in Chart    BASIC METABOLIC PANEL     Status: Abnormal   Collection Time    06/28/13  5:50 AM      Result Value Range   Sodium 144  137 - 147 mEq/L   Potassium 3.2 (*) 3.7 - 5.3 mEq/L   Chloride 110  96 - 112 mEq/L   CO2 21  19 - 32 mEq/L   Glucose, Bld 90  70 - 99 mg/dL   BUN <3 (*) 6 - 23 mg/dL   Creatinine, Ser 0.52  0.50 - 1.10 mg/dL   Calcium 8.0 (*) 8.4 - 10.5 mg/dL   GFR calc non Af Amer >90  >90 mL/min   GFR calc Af Amer >90  >90 mL/min   Comment: (NOTE)     The eGFR has been calculated using the CKD EPI equation.     This calculation has not been validated in all clinical situations.     eGFR's persistently <90 mL/min signify possible Chronic Kidney     Disease.  CBC     Status: Abnormal   Collection Time    06/28/13  5:50 AM      Result Value Range   WBC 8.2  4.0 - 10.5 K/uL   RBC 1.94 (*) 3.87 - 5.11 MIL/uL   Hemoglobin 6.4 (*) 12.0 - 15.0 g/dL   Comment: REPEATED TO VERIFY     CRITICAL RESULT CALLED TO, READ BACK BY AND VERIFIED WITH:     Sabino Gasser (RN) [phone] 06/28/2013 L. LOMAX   HCT 18.5 (*) 36.0 - 46.0 %   MCV 95.4  78.0 - 100.0 fL   MCH 33.0  26.0 - 34.0 pg   MCHC 34.6  30.0 - 36.0 g/dL   RDW 17.6 (*) 11.5 - 15.5 %   Platelets 71 (*) 150 - 400 K/uL   Comment: CONSISTENT WITH PREVIOUS RESULT  PREPARE RBC (CROSSMATCH)     Status: None   Collection Time    06/28/13  7:30 AM  Result Value Range   Order Confirmation ORDER PROCESSED BY BLOOD BANK      Imaging / Studies: Dg Abd 1 View  06/27/2013   CLINICAL DATA:  Nausea, abdominal pain, vomiting  EXAM: ABDOMEN - 1 VIEW  COMPARISON:  None.  FINDINGS: There is a surgical drain present in the mid abdomen. There are surgical stated in the right paramedian abdominal wall. There is a small amount of contrast scattered throughout the colon. There is no bowel dilatation to suggest obstruction. There is no  evidence of pneumoperitoneum, portal venous gas or pneumatosis. There are no pathologic calcifications along the expected course of the ureters.The osseous structures are unremarkable.  IMPRESSION: Nonobstructive bowel gas pattern.   Electronically Signed   By: Kathreen Devoid   On: 06/27/2013 09:34    Medications / Allergies: per chart  Antibiotics: Anti-infectives   Start     Dose/Rate Route Frequency Ordered Stop   06/23/13 1400  piperacillin-tazobactam (ZOSYN) IVPB 3.375 g  Status:  Discontinued     3.375 g 12.5 mL/hr over 240 Minutes Intravenous 3 times per day 06/23/13 1336 06/26/13 1441   06/20/13 1200  vancomycin (VANCOCIN) IVPB 1000 mg/200 mL premix     1,000 mg 200 mL/hr over 60 Minutes Intravenous  Once 06/20/13 1146 06/20/13 1433      Assessment/Plan:  S/p Open Jejunostomy feeding tube Dr. Hulen Skains 06/25/13  POD #3  UGI series today with SBFT to see if the J-tube has "kinked."   TF may be resumed with NGT output appears bilious, no TF. 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 CT of pelvis 1/23 does not show a fistula. Recommend follow up with GYN at [facility] Pager [phone] Office [phone]  06/28/2013

## 2013-06-28 NOTE — Progress Notes (Signed)
[facility] PROGRESS NOTE  Kathey Simer NWG:[id number] DOB: [birth year] DOA: 06/19/2013 PCP: [name], MD  HPI/Subjective: 53 yr old female presents with a CC of Altered mental status. Pt has a PMH of peripheral neuropathy, seizures, catatonia, anxiety, HTN, and fibromyalgia. Pt was previously admitted on 12/13 thru 12/28 with toxic metabolic encephalopathy.  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  EEG showed no recurrent seizures. A 24 hour EEG  showed an intermittent delta pattern felt to represent Frontal Intermittent Rhythmic Delta Activity. Pt was started on two antiepileptics.  This admission, she was again found altered and seemed "comatose" . Since d/c in December, pt has had persistent confusion and is confined to a wheelchair at [facility]. She is currently confused and experiencing visual hallucinations.  Subjective: Patient is more alert, communicating. Denies any pain.Has been vomiting since yesterday , tube feeds have now been on hold. NG tube in place, Hemoglobin dropped this morning to 6.4  Assessment/Plan:  Anemia ? 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 -Pt received diatech catheter placement and plasmapheresis on 06/20/13.   -Plan is for plasmapheresis every other day and Solumedrol 500 mg bid x 3 days. D/C on 1/24. -MRI of the brain showed no acute abnormality.  Detailed results listed below. -Ammonia level is normal - CT chest and transvaginal ultrasound showed no abnormalities. -[facility] paraneoplastic panel reported to be negative, MRI of the C-spine is negative for acute findings  Pending:   - serum NMDA.   Tachycardia -Resolved,  after patient was started back on metoprolol  Hx of Convulsions/Seizures  -Pt has a PMH of seizures and catatonia.   -MRI of the  brain showed thalamic signal abnormality during previous admission.  No longer present on current MRI. -Pt currently taking Keppra and Depakene.  Increased Depakene to 500mg  tid -Valproic acid lvl 35.1 was low.  Repeat Depakote level is 76.6  Hypokalemia Potassium is still low, likely due to vomiting Will replace the potassium and check BMP in am.  Nausea&Vomiting Started after tube feeds was initiated Tuber feeds held overnight as per surgery recommendation. Continue zofran, phenergan prn. NG Tube now inserted   Hx of Anxiety  -Continue pt on Remeron -Patient appears to have had a psychiatric history  -Current illness is bringing out psychiatric symptoms. -Psychiatry consulted as psychotropic medications may help with her current symptoms. -Psych prescribed Risperdal .5 mg bid  Hypertension  -Pt has a PMH of HTN and most recent BP reading was 151/108 -Likely secondary to anxiety and high dose steroids. Continue metoprolol  Malnutrition with history of Gastric Bypass -Per SNF patient has had no PO intake for over 1 week prior to admission. -SNF sent her to hospital for J tube placement. -Gen Surg consulted, J-tube placed  Folate deficiency -Replete parenterally.  Recent hx of c-diff (05/18/13)  -Pt negative for C-diff at this admit  Thrombocytopenia -Discontinue heparin, seems to be chronic up and down, could be secondary to folate deficiency.  Enterovaginal fistula -Reported prior to that this patient had some stools in her vagina. -Discussed with radiology, no evidence of rectovaginal fistula and the previous imaging, (patient had indicated pelvic scan done previously). - Consulted GI, and the did not feel that patient will benefit from colonoscopy for the diagnosis of fistula. - Called and discussed with Gyn on-call, and  they recommend to follow the patient as outpatient. No further workup required in the hospital.  DVT Prophylaxis:  SCD  Code Status: Full  Code Family Communication: Son was contacted to explain procedures and status Disposition Plan: D/C to SNF when medically appropriate.   Consultants:  Psych  Neuro  Procedures:  Plasmapheresis  PICC Line Placement  Antibiotics:  None  Objective: Filed Vitals:   06/27/13 1622 06/27/13 2000 06/28/13 0000 06/28/13 0400  BP: 133/72     Pulse: 131     Temp: 97.8 F (36.6 C) 98.3 F (36.8 C) 97.4 F (36.3 C) 97.5 F (36.4 C)  TempSrc: Oral Oral Oral Oral  Resp: 20     Height:      Weight:      SpO2: 100%       Intake/Output Summary (Last 24 hours) at 06/28/13 0447 Last data filed at 06/27/13 1900  Gross per 24 hour  Intake   1290 ml  Output      0 ml  Net   1290 ml   Filed Weights   06/25/13 0330 06/26/13 0422 06/27/13 0500  Weight: 73.7 kg (162 lb 7.7 oz) 73.4 kg (161 lb 13.1 oz) 73.6 kg (162 lb 4.1 oz)    Exam: Physical Exam: Head: Normocephalic, atraumatic.  Eyes: No signs of jaundice, EOMI Nose: Mucous membranes dry.  Throat: Oropharynx nonerythematous, no exudate appreciated.  Neck: supple,No deformities, masses, or tenderness noted. Lungs: Normal respiratory effort. B/L Clear to auscultation, no crackles or wheezes.  Heart: Regular RR. S1 and S2 normal  Abdomen: BS normoactive. Soft, Nondistended, non-tender.  Extremities: No pretibial edema, no erythema      Data Reviewed: Basic Metabolic Panel:  Recent Labs Lab 06/24/13 0503 06/25/13 0330 06/26/13 0330 06/26/13 1354 06/27/13 0530 06/27/13 1640  NA 149* 150* 145 143 143 145  K 2.8* 3.2* 3.3* 3.0* 3.2* 3.3*  CL 117* 122* 112 105 110 111  CO2 19 17* 20  --  19 20  GLUCOSE 95 90 100* 134* 133* 116*  BUN 15 9 4* <3* <3* <3*  CREATININE 0.63 0.60 0.58 0.60 0.48* 0.52  CALCIUM 8.1* 7.9* 8.1*  --  8.1* 8.0*   Liver Function Tests:  Recent Labs Lab 06/27/13 1640  AST 12  ALT 9  ALKPHOS 16*  BILITOT 0.7  PROT 4.6*  ALBUMIN 3.1*   No results found for this basename: AMMONIA,  in  the last 168 hours CBC:  Recent Labs Lab 06/23/13 0455 06/24/13 0503 06/25/13 0330 06/26/13 0330 06/26/13 1354 06/27/13 0530  WBC 4.0 4.2 3.8* 6.1  --  8.4  HGB 10.2* 8.6* 7.9* 7.5* 7.5* 7.2*  HCT 30.6* 25.9* 23.4* 22.3* 22.0* 20.9*  MCV 94.2 94.9 95.5 94.5  --  93.7  PLT 72* 59* 46* 72*  --  60*   CBG:  Recent Labs Lab 06/27/13 0343 06/27/13 0611 06/27/13 0731 06/27/13 1156 06/27/13 1626  GLUCAP 120* 101* 119* 121* 123*    Recent Results (from the past 240 hour(s))  CLOSTRIDIUM DIFFICILE BY PCR     Status: None   Collection Time    06/19/13  5:14 PM      Result Value Range Status   C difficile by pcr NEGATIVE  NEGATIVE Final  MRSA PCR SCREENING     Status: None   Collection Time    06/19/13  5:14 PM      Result Value Range Status   MRSA by PCR NEGATIVE  NEGATIVE Final   Comment:  The GeneXpert MRSA Assay (FDA     approved for NASAL specimens     only), is one component of a     comprehensive MRSA colonization     surveillance program. It is not     intended to diagnose MRSA     infection nor to guide or     monitor treatment for     MRSA infections.  STOOL CULTURE     Status: None   Collection Time    06/19/13  5:14 PM      Result Value Range Status   Specimen Description STOOL   Final   Special Requests NONE   Final   Culture     Final   Value: NO SALMONELLA, SHIGELLA, CAMPYLOBACTER, YERSINIA, OR E.COLI 0157:H7 ISOLATED     Performed at [facility]   Report Status 06/23/2013 FINAL   Final  CULTURE, BLOOD (ROUTINE X 2)     Status: None   Collection Time    06/23/13  2:02 PM      Result Value Range Status   Specimen Description BLOOD LEFT ARM   Final   Special Requests BOTTLES DRAWN AEROBIC ONLY 10CC   Final   Culture  Setup Time     Final   Value: 06/23/2013 19:55     Performed at [facility]   Culture     Final   Value:        BLOOD CULTURE RECEIVED NO GROWTH TO DATE CULTURE WILL BE HELD FOR 5 DAYS BEFORE ISSUING A  FINAL NEGATIVE REPORT     Performed at [facility]   Report Status PENDING   Incomplete  CULTURE, BLOOD (ROUTINE X 2)     Status: None   Collection Time    06/23/13  2:14 PM      Result Value Range Status   Specimen Description BLOOD LEFT HAND   Final   Special Requests BOTTLES DRAWN AEROBIC ONLY 5.5 CC   Final   Culture  Setup Time     Final   Value: 06/23/2013 19:55     Performed at [facility]   Culture     Final   Value:        BLOOD CULTURE RECEIVED NO GROWTH TO DATE CULTURE WILL BE HELD FOR 5 DAYS BEFORE ISSUING A FINAL NEGATIVE REPORT     Performed at [facility]   Report Status PENDING   Incomplete  CLOSTRIDIUM DIFFICILE BY PCR     Status: None   Collection Time    06/23/13  5:08 PM      Result Value Range Status   C difficile by pcr NEGATIVE  NEGATIVE Final  URINE CULTURE     Status: None   Collection Time    06/23/13  6:15 PM      Result Value Range Status   Specimen Description URINE, CLEAN CATCH   Final   Special Requests NONE   Final   Culture  Setup Time     Final   Value: 06/24/2013 01:22     Performed at [facility] Count     Final   Value: NO GROWTH     Performed at [facility]   Culture     Final   Value: NO GROWTH     Performed at [facility]   Report Status 06/24/2013 FINAL   Final     Studies: Dg Abd 1 View  06/27/2013  CLINICAL DATA:  Nausea, abdominal pain, vomiting  EXAM: ABDOMEN - 1 VIEW  COMPARISON:  None.  FINDINGS: There is a surgical drain present in the mid abdomen. There are surgical stated in the right paramedian abdominal wall. There is a small amount of contrast scattered throughout the colon. There is no bowel dilatation to suggest obstruction. There is no evidence of pneumoperitoneum, portal venous gas or pneumatosis. There are no pathologic calcifications along the expected course of the ureters.The osseous structures are unremarkable.  IMPRESSION: Nonobstructive bowel gas  pattern.   Electronically Signed   By: Elige Ko   On: 06/27/2013 09:34    Scheduled Meds: . levETIRAcetam  1,000 mg Intravenous Q12H  . metoprolol  5 mg Intravenous Q6H  . mirtazapine  30 mg Oral QHS  . multivitamin with minerals  1 tablet Oral Daily  . risperiDONE  0.5 mg Oral BID  . thiamine  100 mg Oral Daily  . valproate sodium  500 mg Intravenous Q8H   Continuous Infusions: . citrate dextrose    . dextrose 5 % and 0.45 % NaCl with KCl 20 mEq/L 75 mL/hr at 06/27/13 1517  . feeding supplement (OSMOLITE 1.5 CAL) Stopped (06/27/13 0045)    Principal Problem:   Acute encephalopathy Active Problems:   Convulsions/seizures   Toxic metabolic encephalopathy   Psychosis   Nausea with vomiting    Ashleah Valtierra S [facility] Pager [phone]. If 7PM-7AM, please contact night-coverage at [email or web address], password [email or web address] 06/28/2013, 4:47 AM  LOS: 9 days

## 2013-06-28 NOTE — Progress Notes (Signed)
Dual signoff completed by M. Jordan LikesSchmitz, RN and Victorino DikeM. Vaughn, RN at [phone].

## 2013-06-28 NOTE — Progress Notes (Signed)
1445 Pt laid flat for pXR. Vomited, small amount yellow, bile. Gave zofran, no relief. Vomited 2nd time, small yellow bile with white-barium. Called radiologist at 1505, going to need another XR? Pt vomiting needs suction again. Hold suction, will get XR at 1600. 1513 Paged Dr. Sharl MaLama. First unit PRBC in, ok to get CBC then sent to dialysis to get second unit after?  1515 Returned call. Orders placed for CBC.  1515 Dr. Lindie SpruceWyatt on unit to see patient, ordered to place NG back on LIS and start TF @ 10 per J-tube to help decompress. No further portable XR needed today. Give second unit PRBC over one hour, then send for plasmapheresis. CBC drawn STAT one hour after second unit.  Dialysis RN called.   3 runs of potassium IVPB will be given after treatment. Cannot give during per dialysis RN.

## 2013-06-28 NOTE — Clinical Social Work Note (Signed)
CSW spoke with Donne HazelSharon Lowa at [facility] to update her on patient and to ensure that [employer] Numbers[place] auth has been started/obtained.  Roddie McBryant Rozella Servello, [place], [place], [phone]

## 2013-06-28 NOTE — Progress Notes (Signed)
Radiology called to get patient to retry upper GI. RN told caller that blood was just started. Caller stated they would take patient if she was stable with blood running. RN sent patient after 30 minutes of blood infusing with no complications.

## 2013-06-29 LAB — BASIC METABOLIC PANEL
BUN: <3 mg/dL — ABNORMAL LOW (ref 6–23)
CHLORIDE: 113 meq/L — AB (ref 96–112)
CO2: 20 mEq/L (ref 19–32)
CREATININE: 0.49 mg/dL — AB (ref 0.50–1.10)
Calcium: 8.1 mg/dL — ABNORMAL LOW (ref 8.4–10.5)
Glucose, Bld: 90 mg/dL (ref 70–99)
Potassium: 3.4 mEq/L — ABNORMAL LOW (ref 3.7–5.3)
Sodium: 144 mEq/L (ref 137–147)

## 2013-06-29 LAB — GLUCOSE, CAPILLARY
GLUCOSE-CAPILLARY: 89 mg/dL (ref 70–99)
GLUCOSE-CAPILLARY: 85 mg/dL (ref 70–99)
GLUCOSE-CAPILLARY: 101 mg/dL — AB (ref 70–99)
Glucose-Capillary: 86 mg/dL (ref 70–99)
Glucose-Capillary: 90 mg/dL (ref 70–99)
Glucose-Capillary: 99 mg/dL (ref 70–99)

## 2013-06-29 LAB — CULTURE, BLOOD (ROUTINE X 2)
CULTURE: NO GROWTH
Culture: NO GROWTH

## 2013-06-29 LAB — CBC
HCT: 26.8 % — ABNORMAL LOW (ref 36.0–46.0)
Hemoglobin: 9.3 g/dL — ABNORMAL LOW (ref 12.0–15.0)
MCH: 31.7 pg (ref 26.0–34.0)
MCHC: 34.7 g/dL (ref 30.0–36.0)
MCV: 91.5 fL (ref 78.0–100.0)
PLATELETS: 81 10*3/uL — AB (ref 150–400)
RBC: 2.93 MIL/uL — ABNORMAL LOW (ref 3.87–5.11)
RDW: 19.4 % — AB (ref 11.5–15.5)
WBC: 5.8 10*3/uL (ref 4.0–10.5)

## 2013-06-29 LAB — TYPE AND SCREEN
ABO/RH(D): A POS
Antibody Screen: NEGATIVE
UNIT DIVISION: 0
Unit division: 0

## 2013-06-29 LAB — PHOSPHORUS: Phosphorus: 1.9 mg/dL — ABNORMAL LOW (ref 2.3–4.6)

## 2013-06-29 MED ORDER — CHLORHEXIDINE GLUCONATE 0.12 % MT SOLN
15.0000 mL | Freq: Two times a day (BID) | OROMUCOSAL | Status: DC
  Administered 2013-06-29 – 2013-07-16 (×31): 15 mL via OROMUCOSAL
  Filled 2013-06-29 (×37): qty 15

## 2013-06-29 MED ORDER — SODIUM CHLORIDE 0.9 % IJ SOLN
10.0000 mL | Freq: Two times a day (BID) | INTRAMUSCULAR | Status: DC
  Administered 2013-06-29 – 2013-07-14 (×25): 10 mL via INTRAVENOUS

## 2013-06-29 MED ORDER — PANTOPRAZOLE SODIUM 40 MG IV SOLR
40.0000 mg | Freq: Two times a day (BID) | INTRAVENOUS | Status: DC
  Administered 2013-06-29 – 2013-07-10 (×24): 40 mg via INTRAVENOUS
  Filled 2013-06-29 (×28): qty 40

## 2013-06-29 MED ORDER — WHITE PETROLATUM GEL
Status: AC
  Filled 2013-06-29: qty 5

## 2013-06-29 MED ORDER — POTASSIUM CHLORIDE 10 MEQ/100ML IV SOLN
10.0000 meq | INTRAVENOUS | Status: AC
  Administered 2013-06-29 (×3): 10 meq via INTRAVENOUS

## 2013-06-29 MED ORDER — OSMOLITE 1.5 CAL PO LIQD
1000.0000 mL | ORAL | Status: DC
  Administered 2013-06-29 – 2013-07-01 (×2): 1000 mL
  Filled 2013-06-29 (×3): qty 1000

## 2013-06-29 NOTE — Progress Notes (Signed)
4 Days Post-Op  Subjective: Not appropriate this am, states "not right now" when I tell her she needs another surgery  Objective: Vital signs in last 24 hours: Temp:  [97.4 F (36.3 C)-99.7 F (37.6 C)] 98.8 F (37.1 C) (01/31 0805) Pulse Rate:  [122-142] 122 (01/31 0805) Resp:  [12-29] 17 (01/31 0805) BP: (112-147)/(56-97) 138/89 mmHg (01/31 0805) SpO2:  [99 %-100 %] 100 % (01/31 0805) Weight:  [159 lb 9.8 oz (72.4 kg)-166 lb 0.1 oz (75.3 kg)] 159 lb 9.8 oz (72.4 kg) (01/31 0435) Last BM Date: 06/26/13  Intake/Output from previous day: 01/30 0701 - 01/31 0700 In: 2200 [I.V.:1345; Blood:700; IV Piggyback:155] Out: 1000 [Emesis/NG output:1000] Intake/Output this shift: Total I/O In: 390 [I.V.:225; IV Piggyback:165] Out: 50 [Emesis/NG output:50]  General appearance: no distress GI: soft, wound clean, j tube in place, moderately tender  Lab Results:   Recent Labs  06/28/13 1800 06/28/13 2236 06/29/13 0500  WBC 6.9  --  5.8  HGB 9.3* 9.2* 9.3*  HCT 27.1* 27.0* 26.8*  PLT 73*  --  81*   BMET  Recent Labs  06/28/13 0550 06/28/13 2236 06/29/13 0500  NA 144 146 144  K 3.2* 3.0* 3.4*  CL 110 105 113*  CO2 21  --  20  GLUCOSE 90 102* 90  BUN <3* <3* <3*  CREATININE 0.52 0.60 0.49*  CALCIUM 8.0*  --  8.1*   PT/INR No results found for this basename: LABPROT, INR,  in the last 72 hours ABG No results found for this basename: PHART, PCO2, PO2, HCO3,  in the last 72 hours  Studies/Results: Dg Ugi W/small Bowel  06/28/2013   CLINICAL DATA:  53 year old female postop 2 days since percutaneous jejunostomy tube placement. Drainage from NG tube. Abdominal pain. Initial encounter.  EXAM: UPPER GI SERIES WITH SMALL BOWEL FOLLOW-THROUGH  FLUOROSCOPIC GUIDED NG TUBE PLACEMENT  TECHNIQUE: Combined double contrast and single contrast upper GI series using effervescent crystals, thick barium, and thin barium. Subsequently, serial images of the small bowel were obtained including  spot views of the terminal ileum.  COMPARISON:  KUB 06/27/2013 and earlier.  FLUOROSCOPY TIME:  9 min and 18 seconds.  FINDINGS: Preprocedural scout view of the abdomen demonstrates a small volume of loculated barium in the colon. This is related to the 06/17/2013 NG tube placement/ confirmation study.  Scout view demonstrates the recently placed right an mid lower abdomen percutaneous jejunostomy. Right lower abdomen skin staples in place.  The scout view also demonstrates the patient's NG tube is looped back up into the esophagus. This was evaluated in real-time with fluoroscopy, and a wire was placed through the tube in an effort to maneuver the tip back into the stomach. However, this was unsuccessful an the tube had be retracted into the nasopharynx. Using fluoroscopic guidance, the tube was then advanced with a wire into the stomach, such that the tip and side hole are within the stomach.  Given the recent abdominal surgery, initially water-soluble contrast was planned, however prominent gastroesophageal reflux of contrast was noted early on, and the decision was made to switch contrast to thin barium. Water-soluble contrast was fully aspirated from the stomach. Subsequently, barium contrast was slowly administered.  The stomach appears small. No gastrojejunostomy is evident. After a short delay, barium emptied the stomach into the proximal duodenum. The second portion of the duodenum is dilated.  After 20 additional min significantly dilated proximal jejunum is opacified, with the small bowel caliber up to 58 mm.  At this point study was discussed with Dr. Jimmye NormanJAMES [name] . We decided to inject the percutaneous jejunostomy tube, and give the barium more time to opacified a small bowel in hopes of loose to dating the small bowel obstruction transition point.  Barium injection through the jejunostomy tube demonstrates normal small bowel loops at the jejunostomy site. This barium was then aspirated.  Subsequently a  3 hr delayed image was obtained demonstrating a gradual tapering of small bowel loops into the right lower abdomen, heading toward the vicinity of the jejunostomy. These smaller loops measure 23 mm diameter (arrow).  At this point, Dr. Lindie SpruceWyatt advised that no additional imaging was needed.  IMPRESSION: 1. High-grade small bowel obstruction. Dilated duodenum and proximal jejunum up to 58 mm diameter. 2. Small bowel loops slowly taper to the right lower abdomen in the vicinity of the percutaneous jejunostomy. 3. The jejunostomy was injected with contrast, and is situated within normal decompressed small bowel. 4. Diminutive stomach. No gastrojejunostomy is evident. Prominent gastroesophageal reflux. 5. At the beginning of the exam the patient's NG tube was malpositioned. This was withdrawn to the nasopharynx and repositioned into the stomach under fluoroscopic guidance.   Electronically Signed   By: Augusto GambleLee  Hall M.D.   On: 06/28/2013 16:29    Anti-infectives: Anti-infectives   Start     Dose/Rate Route Frequency Ordered Stop   06/23/13 1400  piperacillin-tazobactam (ZOSYN) IVPB 3.375 g  Status:  Discontinued     3.375 g 12.5 mL/hr over 240 Minutes Intravenous 3 times per day 06/23/13 1336 06/26/13 1441   06/20/13 1200  vancomycin (VANCOCIN) IVPB 1000 mg/200 mL premix     1,000 mg 200 mL/hr over 60 Minutes Intravenous  Once 06/20/13 1146 06/20/13 1433      Assessment/Plan: POD 4 open j tube  Her j tube is likely too big for lumen and there does not appear to be contrast passing by this tube. Other option is that it is kinked at this site.  She needs to return to the or.  I discussed this with her and she refused.  I am not entirely sure she can consent to this either.  Will need to discuss with her family. Continue ng tube.  Can feed via j tube though.  [facility] HospitalWAKEFIELD,Loretta Doutt 06/29/2013

## 2013-06-29 NOTE — Progress Notes (Signed)
NEURO HOSPITALIST PROGRESS NOTE   SUBJECTIVE:                                                                                                                        Patient is lying in bed comfortable and has no neurological complains. Nursing staff reports that she is still having visual hallucinations and paranoia. No further seizures reported. On Depakote 500 mg TID and keppra 1,000 mg BID    OBJECTIVE:                                                                                                                           Vital signs in last 24 hours: Temp:  [97.4 F (36.3 C)-99.7 F (37.6 C)] 97.4 F (36.3 C) (01/31 0435) Pulse Rate:  [122-142] 122 (01/31 0805) Resp:  [12-29] 17 (01/31 0805) BP: (112-147)/(51-97) 138/89 mmHg (01/31 0805) SpO2:  [99 %-100 %] 100 % (01/31 0805) Weight:  [72.4 kg (159 lb 9.8 oz)-75.3 kg (166 lb 0.1 oz)] 72.4 kg (159 lb 9.8 oz) (01/31 0435)  Intake/Output from previous day: 01/30 0701 - 01/31 0700 In: 2200 [I.V.:1345; Blood:700; IV Piggyback:155] Out: 1000 [Emesis/NG output:1000] Intake/Output this shift: Total I/O In: 55 [IV Piggyback:55] Out: 50 [Emesis/NG output:50] Nutritional status: NPO  Past Medical History  Diagnosis Date  . Peripheral neuropathy   . Fibromyalgia   . Hypertension   . Anxiety   . Catatonia   . Seizures     Neurologic Exam:  Mental Status:  Alert, awake, oriented x 4. Speech fluent without evidence of aphasia. Able to follow 3 step commands without difficulty.  Cranial Nerves:  II: Discs flat bilaterally; Visual fields grossly normal, pupils equal, round, reactive to light and accommodation  [name],IV, VI: ptosis not present, extra-ocular motions intact bilaterally  V,VII: smile symmetric, facial light touch sensation normal bilaterally  VIII: hearing normal bilaterally  IX,X: gag reflex present  XI: bilateral shoulder shrug  XII: midline tongue extension without atrophy  or fasciculations  Motor:  Seems to be able to move all limbs symmetrically.  Tone and bulk:normal tone throughout; no atrophy noted  Sensory: Pinprick and light touch intact throughout, bilaterally  Deep Tendon Reflexes:  1+ all over  Plantars:  Right: downgoing  Left: downgoing  Cerebellar:  No tested.  Gait:  No tested.   Lab Results: No results found for this basename: cbc, bmp, coags, chol, tri, ldl, hga1c   Lipid Panel No results found for this basename: CHOL, TRIG, HDL, CHOLHDL, VLDL, LDLCALC,  in the last 72 hours  Studies/Results: Dg Abd 1 View  06/27/2013   CLINICAL DATA:  Nausea, abdominal pain, vomiting  EXAM: ABDOMEN - 1 VIEW  COMPARISON:  None.  FINDINGS: There is a surgical drain present in the mid abdomen. There are surgical stated in the right paramedian abdominal wall. There is a small amount of contrast scattered throughout the colon. There is no bowel dilatation to suggest obstruction. There is no evidence of pneumoperitoneum, portal venous gas or pneumatosis. There are no pathologic calcifications along the expected course of the ureters.The osseous structures are unremarkable.  IMPRESSION: Nonobstructive bowel gas pattern.   Electronically Signed   By: Elige KoHetal  Patel   On: 06/27/2013 09:34   Dg Kayleen MemosUgi W/small Bowel  06/28/2013   CLINICAL DATA:  53 year old female postop 2 days since percutaneous jejunostomy tube placement. Drainage from NG tube. Abdominal pain. Initial encounter.  EXAM: UPPER GI SERIES WITH SMALL BOWEL FOLLOW-THROUGH  FLUOROSCOPIC GUIDED NG TUBE PLACEMENT  TECHNIQUE: Combined double contrast and single contrast upper GI series using effervescent crystals, thick barium, and thin barium. Subsequently, serial images of the small bowel were obtained including spot views of the terminal ileum.  COMPARISON:  KUB 06/27/2013 and earlier.  FLUOROSCOPY TIME:  9 min and 18 seconds.  FINDINGS: Preprocedural scout view of the abdomen demonstrates a small volume of  loculated barium in the colon. This is related to the 06/17/2013 NG tube placement/ confirmation study.  Scout view demonstrates the recently placed right an mid lower abdomen percutaneous jejunostomy. Right lower abdomen skin staples in place.  The scout view also demonstrates the patient's NG tube is looped back up into the esophagus. This was evaluated in real-time with fluoroscopy, and a wire was placed through the tube in an effort to maneuver the tip back into the stomach. However, this was unsuccessful an the tube had be retracted into the nasopharynx. Using fluoroscopic guidance, the tube was then advanced with a wire into the stomach, such that the tip and side hole are within the stomach.  Given the recent abdominal surgery, initially water-soluble contrast was planned, however prominent gastroesophageal reflux of contrast was noted early on, and the decision was made to switch contrast to thin barium. Water-soluble contrast was fully aspirated from the stomach. Subsequently, barium contrast was slowly administered.  The stomach appears small. No gastrojejunostomy is evident. After a short delay, barium emptied the stomach into the proximal duodenum. The second portion of the duodenum is dilated.  After 20 additional min significantly dilated proximal jejunum is opacified, with the small bowel caliber up to 58 mm.  At this point study was discussed with Dr. Jimmye NormanJAMES [name] . We decided to inject the percutaneous jejunostomy tube, and give the barium more time to opacified a small bowel in hopes of loose to dating the small bowel obstruction transition point.  Barium injection through the jejunostomy tube demonstrates normal small bowel loops at the jejunostomy site. This barium was then aspirated.  Subsequently a 3 hr delayed image was obtained demonstrating a gradual tapering of small bowel loops into the right lower abdomen, heading toward the vicinity of the jejunostomy. These smaller loops measure 23 mm  diameter (arrow).  At this point, Dr. Lindie SpruceWyatt advised  that no additional imaging was needed.  IMPRESSION: 1. High-grade small bowel obstruction. Dilated duodenum and proximal jejunum up to 58 mm diameter. 2. Small bowel loops slowly taper to the right lower abdomen in the vicinity of the percutaneous jejunostomy. 3. The jejunostomy was injected with contrast, and is situated within normal decompressed small bowel. 4. Diminutive stomach. No gastrojejunostomy is evident. Prominent gastroesophageal reflux. 5. At the beginning of the exam the patient's NG tube was malpositioned. This was withdrawn to the nasopharynx and repositioned into the stomach under fluoroscopic guidance.   Electronically Signed   By: Augusto Gamble M.D.   On: 06/28/2013 16:29    MEDICATIONS                                                                                                                       I have reviewed the patient's current medications.  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 Although still with period visual hallucinations, overall neuro status seems to be improving. Complete PLEX. Will continue to follow.  [name] Portela, MD [facility] [phone]  06/29/2013, 9:04 AM

## 2013-06-29 NOTE — Progress Notes (Signed)
[facility] PROGRESS NOTE  Lamonda Noxon ZOX:[id number] DOB: 03-10-1961 DOA: 06/19/2013 PCP: [name], MD  HPI/Subjective: 53 yr old female presents with a CC of Altered mental status. Pt has a PMH of peripheral neuropathy, seizures, catatonia, anxiety, HTN, and fibromyalgia. Pt was previously admitted on 12/13 thru 12/28 with toxic metabolic encephalopathy.  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  EEG showed no recurrent seizures. A 24 hour EEG  showed an intermittent delta pattern felt to represent Frontal Intermittent Rhythmic Delta Activity. Pt was started on two antiepileptics.  This admission, she was again found altered and seemed "comatose" . Since d/c in December, pt has had persistent confusion and is confined to a wheelchair at [facility]. She is currently confused and experiencing visual hallucinations.  Subjective: Patient is more alert, communicating. Denies any pain.Has been vomiting since yesterday , tube feeds have now been on hold. NG tube in place, s/p two units PRBC. No vomiting overnight.  Assessment/Plan:  Anemia ? 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 -Pt received diatech catheter placement and plasmapheresis on 06/20/13.   -Plan is for plasmapheresis every other day and Solumedrol 500 mg bid x 3 days. D/C on 1/24. -MRI of the brain showed no acute abnormality.  Detailed results listed below. -Ammonia level is normal - CT chest and transvaginal ultrasound showed no abnormalities. -[facility] paraneoplastic panel reported to be negative, MRI of the C-spine is negative for acute findings  Pending:   - serum NMDA.   Tachycardia - Patient continues to have sinus tachycardia secondary to constant vomiting  Hx of Convulsions/Seizures  -Pt has a PMH of seizures and  catatonia.   -MRI of the brain showed thalamic signal abnormality during previous admission.  No longer present on current MRI. -Pt currently taking Keppra and Depakene.  Increased Depakene to 500mg  tid -Valproic acid lvl 35.1 was low.  Repeat Depakote level is 76.6  Hypokalemia Potassium is still low, likely due to vomiting Will replace the potassium and check BMP in am.  Nausea&Vomiting Started after tube feeds was initiated Tuber feeds held overnight as per surgery recommendation. Continue zofran, phenergan prn. NG Tube now inserted   Hx of Anxiety  -Continue pt on Remeron -Patient appears to have had a psychiatric history  -Current illness is bringing out psychiatric symptoms. -Psychiatry consulted as psychotropic medications may help with her current symptoms. -Psych prescribed Risperdal .5 mg bid  Hypertension  -Pt has a PMH of HTN and most recent BP reading was 151/108 -Likely secondary to anxiety and high dose steroids. Continue metoprolol  Malnutrition with history of Gastric Bypass -Per SNF patient has had no PO intake for over 1 week prior to admission. -SNF sent her to hospital for J tube placement. -Gen Surg consulted, J-tube placed  Folate deficiency -Replete parenterally.  Recent hx of c-diff (05/18/13)  -Pt negative for C-diff at this admit  Thrombocytopenia -Discontinue heparin, seems to be chronic up and down, could be secondary to folate deficiency.  Enterovaginal fistula -Reported prior to that this patient had some stools in her vagina. -Discussed with radiology, no evidence of rectovaginal fistula and the previous imaging, (patient had indicated pelvic scan done previously). - Consulted GI, and the did not feel that patient will benefit from colonoscopy for the diagnosis of fistula. - Called and discussed with  Gyn on-call, and they recommend to follow the patient as outpatient. No further workup required in the hospital.  DVT Prophylaxis:   SCD  Code Status: Full Code Family Communication: Son was contacted to explain procedures and status Disposition Plan: D/C to SNF when medically appropriate.   Consultants:  Psych  Neuro  Procedures:  Plasmapheresis  PICC Line Placement  Antibiotics:  None  Family communication Called and discussed with son Eusebio MeMarcus Howell on phone  Objective: Filed Vitals:   06/28/13 2330 06/28/13 2339 06/29/13 0015 06/29/13 0435  BP: 112/65 119/58 133/56 130/93  Pulse: 138 137 142 132  Temp: 98.4 F (36.9 C) 98.6 F (37 C) 97.7 F (36.5 C) 97.4 F (36.3 C)  TempSrc: Oral Oral Oral Oral  Resp: 18 19 12 22   Height:    5\' 3"  (1.6 m)  Weight:    72.4 kg (159 lb 9.8 oz)  SpO2: 99% 99% 99% 100%    Intake/Output Summary (Last 24 hours) at 06/29/13 0836 Last data filed at 06/29/13 0700  Gross per 24 hour  Intake   2125 ml  Output   1000 ml  Net   1125 ml   Filed Weights   06/28/13 0953 06/28/13 2215 06/29/13 0435  Weight: 75.3 kg (166 lb 0.1 oz) 75.3 kg (166 lb 0.1 oz) 72.4 kg (159 lb 9.8 oz)    Exam: Physical Exam: Head: Normocephalic, atraumatic.  Eyes: No signs of jaundice, EOMI Nose: NG tube in place Throat: Oropharynx nonerythematous, no exudate appreciated.  Neck: supple,No deformities, masses, or tenderness noted. Lungs: Normal respiratory effort. B/L Clear to auscultation, no crackles or wheezes.  Heart: Regular RR. S1 and S2 normal  Abdomen: BS normoactive. Soft, Nondistended, non-tender.  Extremities: No pretibial edema, no erythema      Data Reviewed: Basic Metabolic Panel:  Recent Labs Lab 06/26/13 0330  06/27/13 0530 06/27/13 1640 06/28/13 0550 06/28/13 2236 06/29/13 0500 06/29/13 0600  NA 145  < > 143 145 144 146 144  --   K 3.3*  < > 3.2* 3.3* 3.2* 3.0* 3.4*  --   CL 112  < > 110 111 110 105 113*  --   CO2 20  --  19 20 21   --  20  --   GLUCOSE 100*  < > 133* 116* 90 102* 90  --   BUN 4*  < > <3* <3* <3* <3* <3*  --   CREATININE 0.58  < >  0.48* 0.52 0.52 0.60 0.49*  --   CALCIUM 8.1*  --  8.1* 8.0* 8.0*  --  8.1*  --   PHOS  --   --   --   --   --   --   --  1.9*  < > = values in this interval not displayed. Liver Function Tests:  Recent Labs Lab 06/27/13 1640  AST 12  ALT 9  ALKPHOS 16*  BILITOT 0.7  PROT 4.6*  ALBUMIN 3.1*   No results found for this basename: AMMONIA,  in the last 168 hours CBC:  Recent Labs Lab 06/26/13 0330  06/27/13 0530 06/28/13 0550 06/28/13 1800 06/28/13 2236 06/29/13 0500  WBC 6.1  --  8.4 8.2 6.9  --  5.8  HGB 7.5*  < > 7.2* 6.4* 9.3* 9.2* 9.3*  HCT 22.3*  < > 20.9* 18.5* 27.1* 27.0* 26.8*  MCV 94.5  --  93.7 95.4 91.2  --  91.5  PLT 72*  --  60* 71* 73*  --  81*  < > = values in this interval not displayed. CBG:  Recent Labs Lab 06/28/13 0349 06/28/13 2102 06/29/13 0029 06/29/13 0438 06/29/13 0807  GLUCAP 98 86 99 89 86    Recent Results (from the past 240 hour(s))  CLOSTRIDIUM DIFFICILE BY PCR     Status: None   Collection Time    06/19/13  5:14 PM      Result Value Range Status   C difficile by pcr NEGATIVE  NEGATIVE Final  MRSA PCR SCREENING     Status: None   Collection Time    06/19/13  5:14 PM      Result Value Range Status   MRSA by PCR NEGATIVE  NEGATIVE Final   Comment:            The GeneXpert MRSA Assay (FDA     approved for NASAL specimens     only), is one component of a     comprehensive MRSA colonization     surveillance program. It is not     intended to diagnose MRSA     infection nor to guide or     monitor treatment for     MRSA infections.  STOOL CULTURE     Status: None   Collection Time    06/19/13  5:14 PM      Result Value Range Status   Specimen Description STOOL   Final   Special Requests NONE   Final   Culture     Final   Value: NO SALMONELLA, SHIGELLA, CAMPYLOBACTER, YERSINIA, OR E.COLI 0157:H7 ISOLATED     Performed at [facility]   Report Status 06/23/2013 FINAL   Final  CULTURE, BLOOD (ROUTINE X 2)      Status: None   Collection Time    06/23/13  2:02 PM      Result Value Range Status   Specimen Description BLOOD LEFT ARM   Final   Special Requests BOTTLES DRAWN AEROBIC ONLY 10CC   Final   Culture  Setup Time     Final   Value: 06/23/2013 19:55     Performed at [facility]   Culture     Final   Value:        BLOOD CULTURE RECEIVED NO GROWTH TO DATE CULTURE WILL BE HELD FOR 5 DAYS BEFORE ISSUING A FINAL NEGATIVE REPORT     Performed at [facility]   Report Status PENDING   Incomplete  CULTURE, BLOOD (ROUTINE X 2)     Status: None   Collection Time    06/23/13  2:14 PM      Result Value Range Status   Specimen Description BLOOD LEFT HAND   Final   Special Requests BOTTLES DRAWN AEROBIC ONLY 5.5 CC   Final   Culture  Setup Time     Final   Value: 06/23/2013 19:55     Performed at [facility]   Culture     Final   Value:        BLOOD CULTURE RECEIVED NO GROWTH TO DATE CULTURE WILL BE HELD FOR 5 DAYS BEFORE ISSUING A FINAL NEGATIVE REPORT     Performed at [facility]   Report Status PENDING   Incomplete  CLOSTRIDIUM DIFFICILE BY PCR     Status: None   Collection Time    06/23/13  5:08 PM      Result Value Range Status   C difficile by pcr NEGATIVE  NEGATIVE Final  URINE CULTURE     Status: None   Collection Time    06/23/13  6:15 PM      Result Value Range Status   Specimen Description URINE, CLEAN CATCH   Final   Special Requests NONE   Final   Culture  Setup Time     Final   Value: 06/24/2013 01:22     Performed at [facility] Count     Final   Value: NO GROWTH     Performed at [facility]   Culture     Final   Value: NO GROWTH     Performed at [facility]   Report Status 06/24/2013 FINAL   Final     Studies: Dg Abd 1 View  06/27/2013   CLINICAL DATA:  Nausea, abdominal pain, vomiting  EXAM: ABDOMEN - 1 VIEW  COMPARISON:  None.  FINDINGS: There is a surgical drain present in the mid abdomen. There  are surgical stated in the right paramedian abdominal wall. There is a small amount of contrast scattered throughout the colon. There is no bowel dilatation to suggest obstruction. There is no evidence of pneumoperitoneum, portal venous gas or pneumatosis. There are no pathologic calcifications along the expected course of the ureters.The osseous structures are unremarkable.  IMPRESSION: Nonobstructive bowel gas pattern.   Electronically Signed   By: Elige Ko   On: 06/27/2013 09:34   Dg Kayleen Memos W/small Bowel  06/28/2013   CLINICAL DATA:  53 year old female postop 2 days since percutaneous jejunostomy tube placement. Drainage from NG tube. Abdominal pain. Initial encounter.  EXAM: UPPER GI SERIES WITH SMALL BOWEL FOLLOW-THROUGH  FLUOROSCOPIC GUIDED NG TUBE PLACEMENT  TECHNIQUE: Combined double contrast and single contrast upper GI series using effervescent crystals, thick barium, and thin barium. Subsequently, serial images of the small bowel were obtained including spot views of the terminal ileum.  COMPARISON:  KUB 06/27/2013 and earlier.  FLUOROSCOPY TIME:  9 min and 18 seconds.  FINDINGS: Preprocedural scout view of the abdomen demonstrates a small volume of loculated barium in the colon. This is related to the 06/17/2013 NG tube placement/ confirmation study.  Scout view demonstrates the recently placed right an mid lower abdomen percutaneous jejunostomy. Right lower abdomen skin staples in place.  The scout view also demonstrates the patient's NG tube is looped back up into the esophagus. This was evaluated in real-time with fluoroscopy, and a wire was placed through the tube in an effort to maneuver the tip back into the stomach. However, this was unsuccessful an the tube had be retracted into the nasopharynx. Using fluoroscopic guidance, the tube was then advanced with a wire into the stomach, such that the tip and side hole are within the stomach.  Given the recent abdominal surgery, initially  water-soluble contrast was planned, however prominent gastroesophageal reflux of contrast was noted early on, and the decision was made to switch contrast to thin barium. Water-soluble contrast was fully aspirated from the stomach. Subsequently, barium contrast was slowly administered.  The stomach appears small. No gastrojejunostomy is evident. After a short delay, barium emptied the stomach into the proximal duodenum. The second portion of the duodenum is dilated.  After 20 additional min significantly dilated proximal jejunum is opacified, with the small bowel caliber up to 58 mm.  At this point study was discussed with Dr. Jimmye Norman . We decided to inject the percutaneous jejunostomy tube, and give the barium more time to opacified a small bowel  in hopes of loose to dating the small bowel obstruction transition point.  Barium injection through the jejunostomy tube demonstrates normal small bowel loops at the jejunostomy site. This barium was then aspirated.  Subsequently a 3 hr delayed image was obtained demonstrating a gradual tapering of small bowel loops into the right lower abdomen, heading toward the vicinity of the jejunostomy. These smaller loops measure 23 mm diameter (arrow).  At this point, Dr. Lindie Spruce advised that no additional imaging was needed.  IMPRESSION: 1. High-grade small bowel obstruction. Dilated duodenum and proximal jejunum up to 58 mm diameter. 2. Small bowel loops slowly taper to the right lower abdomen in the vicinity of the percutaneous jejunostomy. 3. The jejunostomy was injected with contrast, and is situated within normal decompressed small bowel. 4. Diminutive stomach. No gastrojejunostomy is evident. Prominent gastroesophageal reflux. 5. At the beginning of the exam the patient's NG tube was malpositioned. This was withdrawn to the nasopharynx and repositioned into the stomach under fluoroscopic guidance.   Electronically Signed   By: Augusto Gamble M.D.   On: 06/28/2013 16:29     Scheduled Meds: . levETIRAcetam  1,000 mg Intravenous Q12H  . metoprolol  5 mg Intravenous Q6H  . mirtazapine  30 mg Oral QHS  . multivitamin with minerals  1 tablet Oral Daily  . pantoprazole (PROTONIX) IV  40 mg Intravenous Q12H  . risperiDONE  0.5 mg Oral BID  . thiamine  100 mg Oral Daily  . valproate sodium  500 mg Intravenous Q8H   Continuous Infusions: . citrate dextrose    . dextrose 5 % and 0.45 % NaCl with KCl 20 mEq/L 75 mL/hr at 06/29/13 0047  . feeding supplement (OSMOLITE 1.5 CAL) 1,000 mL (06/28/13 1819)    Principal Problem:   Acute encephalopathy Active Problems:   Convulsions/seizures   Toxic metabolic encephalopathy   Psychosis   Nausea with vomiting    Tc [place] S [facility] Pager [phone]. If 7PM-7AM, please contact night-coverage at [email or web address], password [email or web address] 06/29/2013, 8:36 AM  LOS: 10 days

## 2013-06-30 ENCOUNTER — Inpatient Hospital Stay (HOSPITAL_COMMUNITY): Payer: Medicare Other

## 2013-06-30 LAB — CBC
HCT: 26.2 % — ABNORMAL LOW (ref 36.0–46.0)
Hemoglobin: 9 g/dL — ABNORMAL LOW (ref 12.0–15.0)
MCH: 31.8 pg (ref 26.0–34.0)
MCHC: 34.4 g/dL (ref 30.0–36.0)
MCV: 92.6 fL (ref 78.0–100.0)
PLATELETS: 85 10*3/uL — AB (ref 150–400)
RBC: 2.83 MIL/uL — ABNORMAL LOW (ref 3.87–5.11)
RDW: 19.3 % — ABNORMAL HIGH (ref 11.5–15.5)
WBC: 5.9 10*3/uL (ref 4.0–10.5)

## 2013-06-30 LAB — BASIC METABOLIC PANEL
BUN: <3 mg/dL — ABNORMAL LOW (ref 6–23)
CALCIUM: 8.1 mg/dL — AB (ref 8.4–10.5)
CO2: 23 meq/L (ref 19–32)
Chloride: 110 mEq/L (ref 96–112)
Creatinine, Ser: 0.51 mg/dL (ref 0.50–1.10)
GFR calc Af Amer: >90 mL/min (ref >90)
GFR calc non Af Amer: >90 mL/min (ref >90)
Glucose, Bld: 99 mg/dL (ref 70–99)
POTASSIUM: 3.4 meq/L — AB (ref 3.7–5.3)
SODIUM: 145 meq/L (ref 137–147)

## 2013-06-30 LAB — GLUCOSE, CAPILLARY
Glucose-Capillary: 106 mg/dL — ABNORMAL HIGH (ref 70–99)
Glucose-Capillary: 106 mg/dL — ABNORMAL HIGH (ref 70–99)
Glucose-Capillary: 117 mg/dL — ABNORMAL HIGH (ref 70–99)
Glucose-Capillary: 128 mg/dL — ABNORMAL HIGH (ref 70–99)
Glucose-Capillary: 91 mg/dL (ref 70–99)

## 2013-06-30 LAB — MAGNESIUM: Magnesium: 1.6 mg/dL (ref 1.5–2.5)

## 2013-06-30 LAB — LIPASE, BLOOD: LIPASE: 9 U/L — AB (ref 11–59)

## 2013-06-30 MED ORDER — LORAZEPAM 2 MG/ML IJ SOLN
INTRAMUSCULAR | Status: AC
  Filled 2013-06-30: qty 1

## 2013-06-30 MED ORDER — HALOPERIDOL LACTATE 5 MG/ML IJ SOLN
2.0000 mg | Freq: Once | INTRAMUSCULAR | Status: AC
  Administered 2013-06-30: 2 mg via INTRAVENOUS
  Filled 2013-06-30: qty 1

## 2013-06-30 MED ORDER — POTASSIUM PHOSPHATE DIBASIC 3 MMOLE/ML IV SOLN
30.0000 mmol | Freq: Once | INTRAVENOUS | Status: AC
  Administered 2013-06-30: 30 mmol via INTRAVENOUS
  Filled 2013-06-30: qty 10

## 2013-06-30 MED ORDER — LORAZEPAM 2 MG/ML IJ SOLN
2.0000 mg | INTRAMUSCULAR | Status: DC | PRN
  Administered 2013-07-02: 2 mg via INTRAVENOUS
  Filled 2013-06-30: qty 1

## 2013-06-30 MED ORDER — LORAZEPAM 2 MG/ML IJ SOLN
2.0000 mg | Freq: Once | INTRAMUSCULAR | Status: AC
  Administered 2013-06-30: 2 mg via INTRAVENOUS

## 2013-06-30 MED ORDER — POTASSIUM PHOSPHATE DIBASIC 3 MMOLE/ML IV SOLN: 30.0000 meq | Freq: Once | INTRAVENOUS | Status: DC

## 2013-06-30 NOTE — Progress Notes (Signed)
Pt seen.  Doubt she is competent to make any further decisions on care.  Will need J tube revised this week probably.  No change for today

## 2013-06-30 NOTE — Progress Notes (Signed)
Pt yelling from room, upon assessment pt had pulled out NG and vital signs again elevated. Heart rate 150's with resp in the 40's. Again refusing any type of physical care. Dr. Sharl MaLama notified and new orders received. Will continue to monitor.

## 2013-06-30 NOTE — Progress Notes (Signed)
5 Days Post-Op  Subjective: Pt crying with throat and abdomin discomfort.  Objective: Vital signs in last 24 hours: Temp:  [98.4 F (36.9 C)-99.7 F (37.6 C)] 99.7 F (37.6 C) (02/01 0727) Pulse Rate:  [120-135] 120 (02/01 0727) Resp:  [13-21] 13 (02/01 0727) BP: (125-143)/(74-85) 143/83 mmHg (02/01 0408) SpO2:  [100 %] 100 % (02/01 0727) Weight:  [76.3 kg (168 lb 3.4 oz)] 76.3 kg (168 lb 3.4 oz) (02/01 0408) Last BM Date: 06/30/13 230 TF 350 from NG Afebrile, tachycardic, BP OK K+ 3.4 , platelets 85K Intake/Output from previous day: 01/31 0701 - 02/01 0700 In: 2160 [I.V.:1650; NG/GT:230; IV Piggyback:220] Out: 350 [Emesis/NG output:350] Intake/Output this shift: Total I/O In: 115 [I.V.:75; Other:20; NG/GT:20] Out: -   General appearance: alert and crying GI: soft, sore, complaining of pain, some distension.  Lab Results:   Recent Labs  06/29/13 0500 06/30/13 0500  WBC 5.8 5.9  HGB 9.3* 9.0*  HCT 26.8* 26.2*  PLT 81* 85*    BMET  Recent Labs  06/29/13 0500 06/30/13 0500  NA 144 145  K 3.4* 3.4*  CL 113* 110  CO2 20 23  GLUCOSE 90 99  BUN <3* <3*  CREATININE 0.49* 0.51  CALCIUM 8.1* 8.1*   PT/INR No results found for this basename: LABPROT, INR,  in the last 72 hours   Recent Labs Lab 06/27/13 1640  AST 12  ALT 9  ALKPHOS 16*  BILITOT 0.7  PROT 4.6*  ALBUMIN 3.1*     Lipase  No results found for this basename: lipase     Studies/Results: Dg Ugi W/small Bowel  06/28/2013   CLINICAL DATA:  53 year old female postop 2 days since percutaneous jejunostomy tube placement. Drainage from NG tube. Abdominal pain. Initial encounter.  EXAM: UPPER GI SERIES WITH SMALL BOWEL FOLLOW-THROUGH  FLUOROSCOPIC GUIDED NG TUBE PLACEMENT  TECHNIQUE: Combined double contrast and single contrast upper GI series using effervescent crystals, thick barium, and thin barium. Subsequently, serial images of the small bowel were obtained including spot views of the  terminal ileum.  COMPARISON:  KUB 06/27/2013 and earlier.  FLUOROSCOPY TIME:  9 min and 18 seconds.  FINDINGS: Preprocedural scout view of the abdomen demonstrates a small volume of loculated barium in the colon. This is related to the 06/17/2013 NG tube placement/ confirmation study.  Scout view demonstrates the recently placed right an mid lower abdomen percutaneous jejunostomy. Right lower abdomen skin staples in place.  The scout view also demonstrates the patient's NG tube is looped back up into the esophagus. This was evaluated in real-time with fluoroscopy, and a wire was placed through the tube in an effort to maneuver the tip back into the stomach. However, this was unsuccessful an the tube had be retracted into the nasopharynx. Using fluoroscopic guidance, the tube was then advanced with a wire into the stomach, such that the tip and side hole are within the stomach.  Given the recent abdominal surgery, initially water-soluble contrast was planned, however prominent gastroesophageal reflux of contrast was noted early on, and the decision was made to switch contrast to thin barium. Water-soluble contrast was fully aspirated from the stomach. Subsequently, barium contrast was slowly administered.  The stomach appears small. No gastrojejunostomy is evident. After a short delay, barium emptied the stomach into the proximal duodenum. The second portion of the duodenum is dilated.  After 20 additional min significantly dilated proximal jejunum is opacified, with the small bowel caliber up to 58 mm.  At this point  study was discussed with Dr. Jimmye NormanJAMES [name] . We decided to inject the percutaneous jejunostomy tube, and give the barium more time to opacified a small bowel in hopes of loose to dating the small bowel obstruction transition point.  Barium injection through the jejunostomy tube demonstrates normal small bowel loops at the jejunostomy site. This barium was then aspirated.  Subsequently a 3 hr delayed image  was obtained demonstrating a gradual tapering of small bowel loops into the right lower abdomen, heading toward the vicinity of the jejunostomy. These smaller loops measure 23 mm diameter (arrow).  At this point, Dr. Lindie SpruceWyatt advised that no additional imaging was needed.  IMPRESSION: 1. High-grade small bowel obstruction. Dilated duodenum and proximal jejunum up to 58 mm diameter. 2. Small bowel loops slowly taper to the right lower abdomen in the vicinity of the percutaneous jejunostomy. 3. The jejunostomy was injected with contrast, and is situated within normal decompressed small bowel. 4. Diminutive stomach. No gastrojejunostomy is evident. Prominent gastroesophageal reflux. 5. At the beginning of the exam the patient's NG tube was malpositioned. This was withdrawn to the nasopharynx and repositioned into the stomach under fluoroscopic guidance.   Electronically Signed   By: Augusto GambleLee  Hall M.D.   On: 06/28/2013 16:29    Medications: . chlorhexidine  15 mL Mouth Rinse BID  . levETIRAcetam  1,000 mg Intravenous Q12H  . metoprolol  5 mg Intravenous Q6H  . mirtazapine  30 mg Oral QHS  . multivitamin with minerals  1 tablet Oral Daily  . pantoprazole (PROTONIX) IV  40 mg Intravenous Q12H  . potassium phosphate IVPB (mmol)  30 mmol Intravenous Once  . risperiDONE  0.5 mg Oral BID  . sodium chloride  10 mL Intravenous Q12H  . thiamine  100 mg Oral Daily  . valproate sodium  500 mg Intravenous Q8H   . citrate dextrose    . dextrose 5 % and 0.45 % NaCl with KCl 20 mEq/L 75 mL/hr at 06/29/13 1733  . feeding supplement (OSMOLITE 1.5 CAL) 1,000 mL (06/29/13 1205)    Assessment/Plan Encephalopathy of uncertain etiology, possibly autoimmune.  Hx of seizures/convulsions Malnutrition with hx of gastric bypass Folate deficiency C diff colitis 05/18/13 S/p Open Jejunostomy feeding tube Dr. Lindie SpruceWyatt 06/25/13  POD #3  UGI series shows high grade obstruction, and tube needs revision. Questionable rectovaginal  fistula  -[facility] care everywhere. CT of pelvis 1/23 does not show a fistula. [facility]   Plan:  We need to talk with family about consent to revise J tube.  Continue trickle feeds for now. She will most likely need platelets prior to any revision.  She is on SCD's for DVT  LOS: 11 days    Essa Malachi 06/30/2013

## 2013-06-30 NOTE — Progress Notes (Signed)
Pt having both auditory and visual hallucinations, refusing any type of physical care. Both heart rate and respiratory rate elevated. Repeating over and over "let me leave". Dr Sharl MaLama notified, new orders received. Will continue to monitor.

## 2013-06-30 NOTE — Progress Notes (Addendum)
[facility] PROGRESS NOTE  Hedda Crumbley ZOX:[id number] DOB: [birth year] DOA: 06/19/2013 PCP: [name], MD  HPI/Subjective: 53 yr old female presents with a CC of Altered mental status. Pt has a PMH of peripheral neuropathy, seizures, catatonia, anxiety, HTN, and fibromyalgia. Pt was previously admitted on 12/13 thru 12/28 with toxic metabolic encephalopathy.  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  EEG showed no recurrent seizures. A 24 hour EEG  showed an intermittent delta pattern felt to represent Frontal Intermittent Rhythmic Delta Activity. Pt was started on two antiepileptics.  This admission, she was again found altered and seemed "comatose" . Since d/c in December, pt has had persistent confusion and is confined to a wheelchair at [facility]. She is currently confused and experiencing visual hallucinations.She is s/p J tube placement and followed with intractable nausea, vomiting. Surgery following, and plan to take her to OR  Subjective: Patient is more alert, communicating. Denies any pain.Has been vomiting since yesterday , tube feeds have now been on hold. NG tube in place, s/p two units PRBC. No vomiting overnight. Says she wants surgery for the j tube.  Assessment/Plan:  Anemia ? 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 -Pt received diatech catheter placement and plasmapheresis on 06/20/13.   -Plan is for plasmapheresis every other day and Solumedrol 500 mg bid x 3 days. D/C on 1/24. -MRI of the brain showed no acute abnormality.  Detailed results listed below. -Ammonia level is normal - CT chest and transvaginal ultrasound showed no abnormalities. -[facility] paraneoplastic panel reported to be negative, MRI of the C-spine is negative for acute findings  Pending:   - serum NMDA.    Tachycardia - Patient continues to have sinus tachycardia secondary to constant vomiting  Hx of Convulsions/Seizures  -Pt has a PMH of seizures and catatonia.   -MRI of the brain showed thalamic signal abnormality during previous admission.  No longer present on current MRI. -Pt currently taking Keppra and Depakene.  Increased Depakene to 500mg  tid -Valproic acid lvl 35.1 was low.  Repeat Depakote level is 76.6  Hypokalemia Potassium is still low, likely due to vomiting Will replace the potassium and check BMP in am.  Nausea&Vomiting Started after tube feeds was initiated Tuber feeds held overnight as per surgery recommendation. Continue zofran, phenergan prn. NG Tube now inserted   Hx of Anxiety  -Continue pt on Remeron -Patient appears to have had a psychiatric history  -Current illness is bringing out psychiatric symptoms. -Psychiatry consulted as psychotropic medications may help with her current symptoms. -Psych prescribed Risperdal .5 mg bid  Hypertension  -Pt has a PMH of HTN and most recent BP reading was 151/108 -Likely secondary to anxiety and high dose steroids. Continue metoprolol  Malnutrition with history of Gastric Bypass -Per SNF patient has had no PO intake for over 1 week prior to admission. -SNF sent her to hospital for J tube placement. -Gen Surg consulted, J-tube placed  Phosphate deficiency - K phos IV  X 1  Will check phosphorus in am.  Recent hx of c-diff (05/18/13)  -Pt negative for C-diff at this admit  Thrombocytopenia -Discontinue heparin, seems to be chronic up and down, could be secondary to folate deficiency.  Enterovaginal fistula -Reported prior to that this patient had some stools in her vagina. -Discussed with radiology, no  evidence of rectovaginal fistula and the previous imaging, (patient had indicated pelvic scan done previously). - Consulted GI, and the did not feel that patient will benefit from colonoscopy for the  diagnosis of fistula. - Called and discussed with Gyn on-call, and they recommend to follow the patient as outpatient. No further workup required in the hospital.  DVT Prophylaxis:  SCD  Code Status: Full Code Family Communication: Son was contacted to explain procedures and status Disposition Plan: D/C to SNF when medically appropriate.   Consultants:  Psych  Neuro  Procedures:  Plasmapheresis  PICC Line Placement  Antibiotics:  None  Family communication Called and discussed with son Eusebio Me on phone  Objective: Filed Vitals:   06/29/13 1959 06/30/13 0012 06/30/13 0408 06/30/13 0727  BP: 125/74 135/85 143/83   Pulse:  125 135 120  Temp: 98.5 F (36.9 C) 98.9 F (37.2 C) 98.8 F (37.1 C) 99.7 F (37.6 C)  TempSrc: Oral Oral Oral Axillary  Resp:  20 20 13   Height:      Weight:   76.3 kg (168 lb 3.4 oz)   SpO2:  100% 100% 100%    Intake/Output Summary (Last 24 hours) at 06/30/13 0856 Last data filed at 06/30/13 0800  Gross per 24 hour  Intake   2200 ml  Output    300 ml  Net   1900 ml   Filed Weights   06/28/13 2215 06/29/13 0435 06/30/13 0408  Weight: 75.3 kg (166 lb 0.1 oz) 72.4 kg (159 lb 9.8 oz) 76.3 kg (168 lb 3.4 oz)    Exam: Physical Exam: Head: Normocephalic, atraumatic.  Eyes: No signs of jaundice, EOMI Nose: NG tube in place Throat: Oropharynx nonerythematous, no exudate appreciated.  Neck: supple,No deformities, masses, or tenderness noted. Lungs: Normal respiratory effort. B/L Clear to auscultation, no crackles or wheezes.  Heart: Regular RR. S1 and S2 normal  Abdomen: BS normoactive. Soft, Nondistended, non-tender.  Extremities: No pretibial edema, no erythema      Data Reviewed: Basic Metabolic Panel:  Recent Labs Lab 06/27/13 0530 06/27/13 1640 06/28/13 0550 06/28/13 2236 06/29/13 0500 06/29/13 0600 06/30/13 0500  NA 143 145 144 146 144  --  145  K 3.2* 3.3* 3.2* 3.0* 3.4*  --  3.4*  CL 110 111 110 105 113*   --  110  CO2 19 20 21   --  20  --  23  GLUCOSE 133* 116* 90 102* 90  --  99  BUN <3* <3* <3* <3* <3*  --  <3*  CREATININE 0.48* 0.52 0.52 0.60 0.49*  --  0.51  CALCIUM 8.1* 8.0* 8.0*  --  8.1*  --  8.1*  MG  --   --   --   --   --   --  1.6  PHOS  --   --   --   --   --  1.9*  --    Liver Function Tests:  Recent Labs Lab 06/27/13 1640  AST 12  ALT 9  ALKPHOS 16*  BILITOT 0.7  PROT 4.6*  ALBUMIN 3.1*   No results found for this basename: AMMONIA,  in the last 168 hours CBC:  Recent Labs Lab 06/27/13 0530 06/28/13 0550 06/28/13 1800 06/28/13 2236 06/29/13 0500 06/30/13 0500  WBC 8.4 8.2 6.9  --  5.8 5.9  HGB 7.2* 6.4* 9.3* 9.2* 9.3* 9.0*  HCT 20.9* 18.5* 27.1* 27.0* 26.8* 26.2*  MCV 93.7 95.4 91.2  --  91.5 92.6  PLT  60* 71* 73*  --  81* 85*   CBG:  Recent Labs Lab 06/29/13 1709 06/29/13 2017 06/30/13 0011 06/30/13 0405 06/30/13 0730  GLUCAP 90 101* 91 106* 106*    Recent Results (from the past 240 hour(s))  CULTURE, BLOOD (ROUTINE X 2)     Status: None   Collection Time    06/23/13  2:02 PM      Result Value Range Status   Specimen Description BLOOD LEFT ARM   Final   Special Requests BOTTLES DRAWN AEROBIC ONLY 10CC   Final   Culture  Setup Time     Final   Value: 06/23/2013 19:55     Performed at [facility]   Culture     Final   Value: NO GROWTH 5 DAYS     Performed at [facility]   Report Status 06/29/2013 FINAL   Final  CULTURE, BLOOD (ROUTINE X 2)     Status: None   Collection Time    06/23/13  2:14 PM      Result Value Range Status   Specimen Description BLOOD LEFT HAND   Final   Special Requests BOTTLES DRAWN AEROBIC ONLY 5.5 CC   Final   Culture  Setup Time     Final   Value: 06/23/2013 19:55     Performed at [facility]   Culture     Final   Value: NO GROWTH 5 DAYS     Performed at [facility]   Report Status 06/29/2013 FINAL   Final  CLOSTRIDIUM DIFFICILE BY PCR     Status: None   Collection  Time    06/23/13  5:08 PM      Result Value Range Status   C difficile by pcr NEGATIVE  NEGATIVE Final  URINE CULTURE     Status: None   Collection Time    06/23/13  6:15 PM      Result Value Range Status   Specimen Description URINE, CLEAN CATCH   Final   Special Requests NONE   Final   Culture  Setup Time     Final   Value: 06/24/2013 01:22     Performed at [facility] Count     Final   Value: NO GROWTH     Performed at [facility]   Culture     Final   Value: NO GROWTH     Performed at [facility]   Report Status 06/24/2013 FINAL   Final     Studies: Dg Ugi W/small Bowel  06/28/2013   CLINICAL DATA:  53 year old female postop 2 days since percutaneous jejunostomy tube placement. Drainage from NG tube. Abdominal pain. Initial encounter.  EXAM: UPPER GI SERIES WITH SMALL BOWEL FOLLOW-THROUGH  FLUOROSCOPIC GUIDED NG TUBE PLACEMENT  TECHNIQUE: Combined double contrast and single contrast upper GI series using effervescent crystals, thick barium, and thin barium. Subsequently, serial images of the small bowel were obtained including spot views of the terminal ileum.  COMPARISON:  KUB 06/27/2013 and earlier.  FLUOROSCOPY TIME:  9 min and 18 seconds.  FINDINGS: Preprocedural scout view of the abdomen demonstrates a small volume of loculated barium in the colon. This is related to the 06/17/2013 NG tube placement/ confirmation study.  Scout view demonstrates the recently placed right an mid lower abdomen percutaneous jejunostomy. Right lower abdomen skin staples in place.  The scout view also demonstrates the patient's NG tube is looped back up into  the esophagus. This was evaluated in real-time with fluoroscopy, and a wire was placed through the tube in an effort to maneuver the tip back into the stomach. However, this was unsuccessful an the tube had be retracted into the nasopharynx. Using fluoroscopic guidance, the tube was then advanced with a wire into the  stomach, such that the tip and side hole are within the stomach.  Given the recent abdominal surgery, initially water-soluble contrast was planned, however prominent gastroesophageal reflux of contrast was noted early on, and the decision was made to switch contrast to thin barium. Water-soluble contrast was fully aspirated from the stomach. Subsequently, barium contrast was slowly administered.  The stomach appears small. No gastrojejunostomy is evident. After a short delay, barium emptied the stomach into the proximal duodenum. The second portion of the duodenum is dilated.  After 20 additional min significantly dilated proximal jejunum is opacified, with the small bowel caliber up to 58 mm.  At this point study was discussed with Dr. Jimmye NormanJAMES [name] . We decided to inject the percutaneous jejunostomy tube, and give the barium more time to opacified a small bowel in hopes of loose to dating the small bowel obstruction transition point.  Barium injection through the jejunostomy tube demonstrates normal small bowel loops at the jejunostomy site. This barium was then aspirated.  Subsequently a 3 hr delayed image was obtained demonstrating a gradual tapering of small bowel loops into the right lower abdomen, heading toward the vicinity of the jejunostomy. These smaller loops measure 23 mm diameter (arrow).  At this point, Dr. Lindie SpruceWyatt advised that no additional imaging was needed.  IMPRESSION: 1. High-grade small bowel obstruction. Dilated duodenum and proximal jejunum up to 58 mm diameter. 2. Small bowel loops slowly taper to the right lower abdomen in the vicinity of the percutaneous jejunostomy. 3. The jejunostomy was injected with contrast, and is situated within normal decompressed small bowel. 4. Diminutive stomach. No gastrojejunostomy is evident. Prominent gastroesophageal reflux. 5. At the beginning of the exam the patient's NG tube was malpositioned. This was withdrawn to the nasopharynx and repositioned into the  stomach under fluoroscopic guidance.   Electronically Signed   By: Augusto GambleLee  Hall M.D.   On: 06/28/2013 16:29    Scheduled Meds: . chlorhexidine  15 mL Mouth Rinse BID  . levETIRAcetam  1,000 mg Intravenous Q12H  . metoprolol  5 mg Intravenous Q6H  . mirtazapine  30 mg Oral QHS  . multivitamin with minerals  1 tablet Oral Daily  . pantoprazole (PROTONIX) IV  40 mg Intravenous Q12H  . risperiDONE  0.5 mg Oral BID  . sodium chloride  10 mL Intravenous Q12H  . thiamine  100 mg Oral Daily  . valproate sodium  500 mg Intravenous Q8H   Continuous Infusions: . citrate dextrose    . dextrose 5 % and 0.45 % NaCl with KCl 20 mEq/L 75 mL/hr at 06/29/13 1733  . feeding supplement (OSMOLITE 1.5 CAL) 1,000 mL (06/29/13 1205)    Principal Problem:   Acute encephalopathy Active Problems:   Convulsions/seizures   Toxic metabolic encephalopathy   Psychosis   Nausea with vomiting    Dylin Ihnen S [facility] Pager [phone]. If 7PM-7AM, please contact night-coverage at [email or web address], password [email or web address] 06/30/2013, 8:56 AM  LOS: 11 days

## 2013-07-01 LAB — GLUCOSE, CAPILLARY: Glucose-Capillary: 94 mg/dL (ref 70–99)

## 2013-07-01 LAB — BASIC METABOLIC PANEL
BUN: <3 mg/dL — ABNORMAL LOW (ref 6–23)
CALCIUM: 8 mg/dL — AB (ref 8.4–10.5)
CO2: 24 mEq/L (ref 19–32)
Chloride: 106 mEq/L (ref 96–112)
Creatinine, Ser: 0.52 mg/dL (ref 0.50–1.10)
GFR calc Af Amer: >90 mL/min (ref >90)
GFR calc non Af Amer: >90 mL/min (ref >90)
Glucose, Bld: 90 mg/dL (ref 70–99)
Potassium: 3.9 mEq/L (ref 3.7–5.3)
Sodium: 143 mEq/L (ref 137–147)

## 2013-07-01 LAB — CBC
HCT: 28.5 % — ABNORMAL LOW (ref 36.0–46.0)
Hemoglobin: 9.4 g/dL — ABNORMAL LOW (ref 12.0–15.0)
MCH: 31.1 pg (ref 26.0–34.0)
MCHC: 33 g/dL (ref 30.0–36.0)
MCV: 94.4 fL (ref 78.0–100.0)
PLATELETS: 96 10*3/uL — AB (ref 150–400)
RBC: 3.02 MIL/uL — ABNORMAL LOW (ref 3.87–5.11)
RDW: 19.1 % — ABNORMAL HIGH (ref 11.5–15.5)
WBC: 7.1 10*3/uL (ref 4.0–10.5)

## 2013-07-01 LAB — PHOSPHORUS: Phosphorus: 3.5 mg/dL (ref 2.3–4.6)

## 2013-07-01 NOTE — Progress Notes (Signed)
[facility] PROGRESS NOTE  Naina Sleeper ZOX:[id number] DOB: 07-09-1960 DOA: 06/19/2013 PCP: [name], MD  HPI/Subjective: 53 yr old female presents with a CC of Altered mental status. Pt has a PMH of peripheral neuropathy, seizures, catatonia, anxiety, HTN, and fibromyalgia. Pt was previously admitted on 12/13 thru 12/28 with toxic metabolic encephalopathy.  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  EEG showed no recurrent seizures. A 24 hour EEG  showed an intermittent delta pattern felt to represent Frontal Intermittent Rhythmic Delta Activity. Pt was started on two antiepileptics.  This admission, she was again found altered and seemed "comatose" . Since d/c in December, pt has had persistent confusion and is confined to a wheelchair at [facility]. She is currently confused and experiencing visual hallucinations.She is s/p J tube placement and followed with intractable nausea, vomiting. Surgery following, and plan to take her to OR  Subjective: Patient is more alert, communicating. Denies any pain.Has been vomiting since yesterday , tube feeds have now been on hold. NG tube in place, s/p two units PRBC. No vomiting overnight. Says she wants surgery for the j tube. Patient developed hallucinations last night and required Ativan.  Assessment/Plan:  Anemia ? 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 -Pt received diatech catheter placement and plasmapheresis on 06/20/13.   -Plan is for plasmapheresis every other day and Solumedrol 500 mg bid x 3 days. D/C on 1/24. -MRI of the brain showed no acute abnormality.  Detailed results listed below. -Ammonia level is normal - CT chest and transvaginal ultrasound showed no abnormalities. -[facility] paraneoplastic panel reported to be negative, MRI of the  C-spine is negative for acute findings - Continue with Ativan prn.  Pending:   - serum NMDA.   Tachycardia - Patient continues to have sinus tachycardia secondary to constant vomiting  Hx of Convulsions/Seizures  -Pt has a PMH of seizures and catatonia.   -MRI of the brain showed thalamic signal abnormality during previous admission.  No longer present on current MRI. -Pt currently taking Keppra and Depakene.  Increased Depakene to 500mg  tid -Valproic acid lvl 35.1 was low.  Repeat Depakote level is 76.6  Hypokalemia - Repleted  Nausea&Vomiting Started after tube feeds was initiated Tuber feeds held overnight as per surgery recommendation. Continue zofran, phenergan prn. NG Tube now inserted Plan is for revision of the j tube per surgery   Hx of Anxiety  -Continue pt on Remeron -Patient appears to have had a psychiatric history  -Current illness is bringing out psychiatric symptoms. -Psychiatry consulted as psychotropic medications may help with her current symptoms. -Psych prescribed Risperdal .5 mg bid  Hypertension  -Pt has a PMH of HTN and most recent BP reading was 151/108 -Likely secondary to anxiety and high dose steroids. Continue metoprolol  Malnutrition with history of Gastric Bypass -Per SNF patient has had no PO intake for over 1 week prior to admission. -SNF sent her to hospital for J tube placement. -Gen Surg consulted, J-tube placed  Phosphate deficiency - K phos IV  X 1  Will check phosphorus in am.  Recent hx of c-diff (05/18/13)  -Pt negative for C-diff at this admit  Thrombocytopenia -Discontinue heparin, seems to be chronic up and down, could be secondary to folate deficiency.  Enterovaginal fistula -Reported prior to that this patient had some  stools in her vagina. -Discussed with radiology, no evidence of rectovaginal fistula and the previous imaging, (patient had indicated pelvic scan done previously). - Consulted GI, and the did not feel  that patient will benefit from colonoscopy for the diagnosis of fistula. - Called and discussed with Gyn on-call, and they recommend to follow the patient as outpatient. No further workup required in the hospital.  DVT Prophylaxis:  SCD  Code Status: Full Code Family Communication: Son was contacted to explain procedures and status Disposition Plan: D/C to SNF when medically appropriate.   Consultants:  Psych  Neuro  Procedures:  Plasmapheresis  PICC Line Placement  Antibiotics:  None  Family communication Called and discussed with son Eusebio Me on phone  Objective: Filed Vitals:   06/30/13 1945 06/30/13 2322 07/01/13 0356 07/01/13 0722  BP: 123/84 160/53 116/84 121/86  Pulse: 104 110 129 122  Temp: 98.7 F (37.1 C) 99.9 F (37.7 C) 98.9 F (37.2 C) 99.9 F (37.7 C)  TempSrc: Oral Oral Oral Oral  Resp: 20 16 16 11   Height:      Weight:   75.9 kg (167 lb 5.3 oz)   SpO2: 100% 100% 100% 100%    Intake/Output Summary (Last 24 hours) at 07/01/13 0841 Last data filed at 07/01/13 1610  Gross per 24 hour  Intake 2454.83 ml  Output    950 ml  Net 1504.83 ml   Filed Weights   06/29/13 0435 06/30/13 0408 07/01/13 0356  Weight: 72.4 kg (159 lb 9.8 oz) 76.3 kg (168 lb 3.4 oz) 75.9 kg (167 lb 5.3 oz)    Exam: Physical Exam: Head: Normocephalic, atraumatic.  Eyes: No signs of jaundice, EOMI Nose: NG tube in place Throat: Oropharynx nonerythematous, no exudate appreciated.  Neck: supple,No deformities, masses, or tenderness noted. Lungs: Normal respiratory effort. B/L Clear to auscultation, no crackles or wheezes.  Heart: Regular RR. S1 and S2 normal  Abdomen: BS normoactive. Soft, Nondistended, non-tender.  Extremities: No pretibial edema, no erythema      Data Reviewed: Basic Metabolic Panel:  Recent Labs Lab 06/27/13 1640 06/28/13 0550 06/28/13 2236 06/29/13 0500 06/29/13 0600 06/30/13 0500 07/01/13 0620  NA 145 144 146 144  --  145 143  K  3.3* 3.2* 3.0* 3.4*  --  3.4* 3.9  CL 111 110 105 113*  --  110 106  CO2 20 21  --  20  --  23 24  GLUCOSE 116* 90 102* 90  --  99 90  BUN <3* <3* <3* <3*  --  <3* <3*  CREATININE 0.52 0.52 0.60 0.49*  --  0.51 0.52  CALCIUM 8.0* 8.0*  --  8.1*  --  8.1* 8.0*  MG  --   --   --   --   --  1.6  --   PHOS  --   --   --   --  1.9*  --  3.5   Liver Function Tests:  Recent Labs Lab 06/27/13 1640  AST 12  ALT 9  ALKPHOS 16*  BILITOT 0.7  PROT 4.6*  ALBUMIN 3.1*   No results found for this basename: AMMONIA,  in the last 168 hours CBC:  Recent Labs Lab 06/28/13 0550 06/28/13 1800 06/28/13 2236 06/29/13 0500 06/30/13 0500 07/01/13 0620  WBC 8.2 6.9  --  5.8 5.9 7.1  HGB 6.4* 9.3* 9.2* 9.3* 9.0* 9.4*  HCT 18.5* 27.1* 27.0* 26.8* 26.2* 28.5*  MCV 95.4 91.2  --  91.5 92.6 94.4  PLT 71* 73*  --  81* 85* 96*   CBG:  Recent Labs Lab 06/30/13 0405 06/30/13 0730 06/30/13 1105 06/30/13 1457 07/01/13 0727  GLUCAP 106* 106* 117* 128* 94    Recent Results (from the past 240 hour(s))  CULTURE, BLOOD (ROUTINE X 2)     Status: None   Collection Time    06/23/13  2:02 PM      Result Value Range Status   Specimen Description BLOOD LEFT ARM   Final   Special Requests BOTTLES DRAWN AEROBIC ONLY 10CC   Final   Culture  Setup Time     Final   Value: 06/23/2013 19:55     Performed at [facility]   Culture     Final   Value: NO GROWTH 5 DAYS     Performed at [facility]   Report Status 06/29/2013 FINAL   Final  CULTURE, BLOOD (ROUTINE X 2)     Status: None   Collection Time    06/23/13  2:14 PM      Result Value Range Status   Specimen Description BLOOD LEFT HAND   Final   Special Requests BOTTLES DRAWN AEROBIC ONLY 5.5 CC   Final   Culture  Setup Time     Final   Value: 06/23/2013 19:55     Performed at [facility]   Culture     Final   Value: NO GROWTH 5 DAYS     Performed at [facility]   Report Status 06/29/2013 FINAL   Final   CLOSTRIDIUM DIFFICILE BY PCR     Status: None   Collection Time    06/23/13  5:08 PM      Result Value Range Status   C difficile by pcr NEGATIVE  NEGATIVE Final  URINE CULTURE     Status: None   Collection Time    06/23/13  6:15 PM      Result Value Range Status   Specimen Description URINE, CLEAN CATCH   Final   Special Requests NONE   Final   Culture  Setup Time     Final   Value: 06/24/2013 01:22     Performed at [facility] Count     Final   Value: NO GROWTH     Performed at [facility]   Culture     Final   Value: NO GROWTH     Performed at [facility]   Report Status 06/24/2013 FINAL   Final     Studies: Dg Abd Portable 1v  06/30/2013   CLINICAL DATA:  NG tube placement.  EXAM: PORTABLE ABDOMEN - 1 VIEW  COMPARISON:  Single view of the abdomen 06/27/2013. Upper GI/small bowel follow-through 06/28/2013  FINDINGS: NG tube is in place in good position with the tip in this distal stomach. Contrast material in small bowel from the comparison upper GI series/small bowel follow-through. Small bowel loops are dilated. Only a small amount of loculated barium is seen in the colon.  IMPRESSION: NG tube in good position.  Small bowel obstruction.   Electronically Signed   By: Drusilla Kanner M.D.   On: 06/30/2013 19:01    Scheduled Meds: . chlorhexidine  15 mL Mouth Rinse BID  . levETIRAcetam  1,000 mg Intravenous Q12H  . metoprolol  5 mg Intravenous Q6H  . mirtazapine  30 mg Oral QHS  . multivitamin with minerals  1 tablet Oral Daily  . pantoprazole (  PROTONIX) IV  40 mg Intravenous Q12H  . risperiDONE  0.5 mg Oral BID  . sodium chloride  10 mL Intravenous Q12H  . thiamine  100 mg Oral Daily  . valproate sodium  500 mg Intravenous Q8H   Continuous Infusions: . citrate dextrose    . dextrose 5 % and 0.45 % NaCl with KCl 20 mEq/L 75 mL/hr at 06/29/13 1733  . feeding supplement (OSMOLITE 1.5 CAL) 1,000 mL (07/01/13 0739)    Principal  Problem:   Acute encephalopathy Active Problems:   Convulsions/seizures   Toxic metabolic encephalopathy   Psychosis   Nausea with vomiting    [facility] HospitalAMA,GAGAN S [facility] Pager [phone]. If 7PM-7AM, please contact night-coverage at [email or web address], password [email or web address] 07/01/2013, 8:41 AM  LOS: 12 days

## 2013-07-01 NOTE — Progress Notes (Signed)
Patient still somewhat obstructed from J-tube.  Will need revision in the OR.  Will attempt to contact her family for consent.[name].  Montrice Montuori O. Gae BonWyatt, [name], MD, FACS [phone]--pager [phone]--office [facility]

## 2013-07-01 NOTE — Progress Notes (Signed)
Utilization review completed.  

## 2013-07-02 ENCOUNTER — Encounter (HOSPITAL_COMMUNITY): Payer: Self-pay | Admitting: Certified Registered Nurse Anesthetist

## 2013-07-02 ENCOUNTER — Encounter (HOSPITAL_COMMUNITY)
Admission: AD | Disposition: A | Discharge status: Skilled Nursing Facility | Payer: Medicare Other | Source: Ambulatory Visit | Attending: Family Medicine

## 2013-07-02 ENCOUNTER — Inpatient Hospital Stay (HOSPITAL_COMMUNITY): Payer: Medicare Other | Admitting: Anesthesiology

## 2013-07-02 ENCOUNTER — Encounter (HOSPITAL_COMMUNITY): Payer: Medicare Other | Admitting: Anesthesiology

## 2013-07-02 DIAGNOSIS — K56609 Unspecified intestinal obstruction, unspecified as to partial versus complete obstruction: Secondary | ICD-10-CM

## 2013-07-02 DIAGNOSIS — G0490 Encephalitis and encephalomyelitis, unspecified: Secondary | ICD-10-CM | POA: Diagnosis not present

## 2013-07-02 DIAGNOSIS — R4182 Altered mental status, unspecified: Secondary | ICD-10-CM | POA: Diagnosis not present

## 2013-07-02 HISTORY — PX: BOWEL RESECTION: SHX1257

## 2013-07-02 HISTORY — PX: LAPAROTOMY: SHX154

## 2013-07-02 LAB — BASIC METABOLIC PANEL
BUN: <3 mg/dL — ABNORMAL LOW (ref 6–23)
CHLORIDE: 107 meq/L (ref 96–112)
CO2: 27 meq/L (ref 19–32)
CREATININE: 0.56 mg/dL (ref 0.50–1.10)
Calcium: 8 mg/dL — ABNORMAL LOW (ref 8.4–10.5)
GFR calc Af Amer: >90 mL/min (ref >90)
GFR calc non Af Amer: >90 mL/min (ref >90)
Glucose, Bld: 86 mg/dL (ref 70–99)
Potassium: 3.8 mEq/L (ref 3.7–5.3)
Sodium: 145 mEq/L (ref 137–147)

## 2013-07-02 LAB — CBC
HEMATOCRIT: 27.9 % — AB (ref 36.0–46.0)
HEMOGLOBIN: 9.2 g/dL — AB (ref 12.0–15.0)
MCH: 31.7 pg (ref 26.0–34.0)
MCHC: 33 g/dL (ref 30.0–36.0)
MCV: 96.2 fL (ref 78.0–100.0)
Platelets: 99 10*3/uL — ABNORMAL LOW (ref 150–400)
RBC: 2.9 MIL/uL — ABNORMAL LOW (ref 3.87–5.11)
RDW: 18.7 % — ABNORMAL HIGH (ref 11.5–15.5)
WBC: 7.3 10*3/uL (ref 4.0–10.5)

## 2013-07-02 LAB — GLUCOSE, CAPILLARY: GLUCOSE-CAPILLARY: 78 mg/dL (ref 70–99)

## 2013-07-02 SURGERY — LAPAROTOMY, EXPLORATORY
Anesthesia: General | Site: Abdomen

## 2013-07-02 MED ORDER — 0.9 % SODIUM CHLORIDE (POUR BTL) OPTIME
TOPICAL | Status: DC | PRN
  Administered 2013-07-02: 2000 mL

## 2013-07-02 MED ORDER — HEPARIN SODIUM (PORCINE) 1000 UNIT/ML IJ SOLN
1000.0000 [IU] | Freq: Once | INTRAMUSCULAR | Status: DC
  Filled 2013-07-02: qty 1

## 2013-07-02 MED ORDER — SODIUM CHLORIDE 0.9 % IV SOLN: 4.0000 g | Freq: Once | INTRAVENOUS | Status: DC

## 2013-07-02 MED ORDER — PHENYLEPHRINE HCL 10 MG/ML IJ SOLN
INTRAMUSCULAR | Status: DC | PRN
  Administered 2013-07-02 (×2): 80 ug via INTRAVENOUS
  Administered 2013-07-02: 120 ug via INTRAVENOUS
  Administered 2013-07-02: 80 ug via INTRAVENOUS
  Administered 2013-07-02: 40 ug via INTRAVENOUS

## 2013-07-02 MED ORDER — FENTANYL CITRATE 0.05 MG/ML IJ SOLN
INTRAMUSCULAR | Status: DC | PRN
  Administered 2013-07-02: 100 ug via INTRAVENOUS

## 2013-07-02 MED ORDER — GLYCOPYRROLATE 0.2 MG/ML IJ SOLN
INTRAMUSCULAR | Status: AC
  Filled 2013-07-02: qty 3

## 2013-07-02 MED ORDER — ROCURONIUM BROMIDE 50 MG/5ML IV SOLN
INTRAVENOUS | Status: AC
  Filled 2013-07-02: qty 1

## 2013-07-02 MED ORDER — PROMETHAZINE HCL 25 MG/ML IJ SOLN: 6.2500 mg | INTRAMUSCULAR | Status: DC | PRN

## 2013-07-02 MED ORDER — SODIUM CHLORIDE 0.9 % IV SOLN
10.0000 mg | INTRAVENOUS | Status: DC | PRN
  Administered 2013-07-02: 40 ug/min via INTRAVENOUS

## 2013-07-02 MED ORDER — HYDROMORPHONE HCL PF 1 MG/ML IJ SOLN
1.0000 mg | Freq: Once | INTRAMUSCULAR | Status: AC
  Administered 2013-07-02: 1 mg via INTRAVENOUS
  Filled 2013-07-02: qty 1

## 2013-07-02 MED ORDER — ONDANSETRON HCL 4 MG/2ML IJ SOLN
INTRAMUSCULAR | Status: DC | PRN
  Administered 2013-07-02: 4 mg via INTRAVENOUS

## 2013-07-02 MED ORDER — NEOSTIGMINE METHYLSULFATE 1 MG/ML IJ SOLN
INTRAMUSCULAR | Status: AC
  Filled 2013-07-02: qty 10

## 2013-07-02 MED ORDER — HYDROMORPHONE HCL PF 1 MG/ML IJ SOLN
INTRAMUSCULAR | Status: AC
  Filled 2013-07-02: qty 1

## 2013-07-02 MED ORDER — HYDROMORPHONE HCL PF 1 MG/ML IJ SOLN
0.5000 mg | INTRAMUSCULAR | Status: DC | PRN
  Administered 2013-07-02 – 2013-07-11 (×8): 1 mg via INTRAVENOUS
  Administered 2013-07-11 (×2): 0.5 mg via INTRAVENOUS
  Administered 2013-07-12: 1 mg via INTRAVENOUS
  Administered 2013-07-13: 0.5 mg via INTRAVENOUS
  Administered 2013-07-16: 1 mg via INTRAVENOUS
  Filled 2013-07-02 (×13): qty 1

## 2013-07-02 MED ORDER — ARTIFICIAL TEARS OP OINT
TOPICAL_OINTMENT | OPHTHALMIC | Status: AC
  Filled 2013-07-02: qty 3.5

## 2013-07-02 MED ORDER — SUCCINYLCHOLINE CHLORIDE 20 MG/ML IJ SOLN
INTRAMUSCULAR | Status: DC | PRN
  Administered 2013-07-02: 100 mg via INTRAVENOUS

## 2013-07-02 MED ORDER — CIPROFLOXACIN IN D5W 400 MG/200ML IV SOLN
400.0000 mg | INTRAVENOUS | Status: AC
  Administered 2013-07-02: 400 mg via INTRAVENOUS
  Filled 2013-07-02: qty 200

## 2013-07-02 MED ORDER — CALCIUM CARBONATE ANTACID 500 MG PO CHEW
2.0000 | CHEWABLE_TABLET | ORAL | Status: AC
  Filled 2013-07-02: qty 2

## 2013-07-02 MED ORDER — LACTATED RINGERS IV SOLN
INTRAVENOUS | Status: DC | PRN
  Administered 2013-07-02: 13:00:00 via INTRAVENOUS

## 2013-07-02 MED ORDER — ARTIFICIAL TEARS OP OINT
TOPICAL_OINTMENT | OPHTHALMIC | Status: DC | PRN
  Administered 2013-07-02: 1 via OPHTHALMIC

## 2013-07-02 MED ORDER — HYDROMORPHONE HCL PF 1 MG/ML IJ SOLN
0.2500 mg | INTRAMUSCULAR | Status: DC | PRN
  Administered 2013-07-02 (×2): 0.5 mg via INTRAVENOUS

## 2013-07-02 MED ORDER — METOPROLOL TARTRATE 1 MG/ML IV SOLN
5.0000 mg | Freq: Once | INTRAVENOUS | Status: AC
  Administered 2013-07-02: 5 mg via INTRAVENOUS
  Filled 2013-07-02: qty 5

## 2013-07-02 MED ORDER — ONDANSETRON HCL 4 MG/2ML IJ SOLN
INTRAMUSCULAR | Status: AC
  Filled 2013-07-02: qty 2

## 2013-07-02 MED ORDER — FENTANYL CITRATE 0.05 MG/ML IJ SOLN
INTRAMUSCULAR | Status: AC
  Filled 2013-07-02: qty 5

## 2013-07-02 MED ORDER — PROPOFOL 10 MG/ML IV BOLUS
INTRAVENOUS | Status: AC
  Filled 2013-07-02: qty 20

## 2013-07-02 MED ORDER — PROPOFOL 10 MG/ML IV BOLUS
INTRAVENOUS | Status: DC | PRN
  Administered 2013-07-02: 14 mg via INTRAVENOUS

## 2013-07-02 MED ORDER — POVIDONE-IODINE 10 % OINT PACKET
TOPICAL_OINTMENT | CUTANEOUS | Status: DC | PRN
  Administered 2013-07-02: 1 via TOPICAL

## 2013-07-02 MED ORDER — LIDOCAINE HCL (CARDIAC) 20 MG/ML IV SOLN
INTRAVENOUS | Status: DC | PRN
  Administered 2013-07-02: 100 mg via INTRAVENOUS

## 2013-07-02 MED ORDER — POVIDONE-IODINE 10 % EX OINT
TOPICAL_OINTMENT | CUTANEOUS | Status: AC
  Filled 2013-07-02: qty 28.35

## 2013-07-02 MED ORDER — SODIUM CHLORIDE 0.9 % IV BOLUS (SEPSIS)
500.0000 mL | Freq: Once | INTRAVENOUS | Status: AC
  Administered 2013-07-02: 500 mL via INTRAVENOUS

## 2013-07-02 MED ORDER — SODIUM CHLORIDE 0.9 % IV BOLUS (SEPSIS)
500.0000 mL | Freq: Once | INTRAVENOUS | Status: AC
  Administered 2013-07-03: 500 mL via INTRAVENOUS

## 2013-07-02 MED ORDER — MIDAZOLAM HCL 2 MG/2ML IJ SOLN
INTRAMUSCULAR | Status: AC
  Filled 2013-07-02: qty 2

## 2013-07-02 SURGICAL SUPPLY — 56 items
BLADE SURG ROTATE 9660 (MISCELLANEOUS) IMPLANT
CANISTER SUCTION 2500CC (MISCELLANEOUS) ×3 IMPLANT
CATH ROBINSON RED A/P 10FR (CATHETERS) ×3 IMPLANT
CHLORAPREP W/TINT 26ML (MISCELLANEOUS) ×3 IMPLANT
COVER MAYO STAND STRL (DRAPES) IMPLANT
COVER SURGICAL LIGHT HANDLE (MISCELLANEOUS) ×3 IMPLANT
DRAPE LAPAROSCOPIC ABDOMINAL (DRAPES) ×3 IMPLANT
DRAPE PROXIMA HALF (DRAPES) IMPLANT
DRAPE UTILITY 15X26 W/TAPE STR (DRAPE) ×6 IMPLANT
DRAPE WARM FLUID 44X44 (DRAPE) ×3 IMPLANT
DRSG COVADERM 4X8 (GAUZE/BANDAGES/DRESSINGS) ×3 IMPLANT
DRSG OPSITE POSTOP 4X10 (GAUZE/BANDAGES/DRESSINGS) IMPLANT
DRSG OPSITE POSTOP 4X8 (GAUZE/BANDAGES/DRESSINGS) IMPLANT
ELECT BLADE 6.5 EXT (BLADE) IMPLANT
ELECT CAUTERY BLADE 6.4 (BLADE) ×3 IMPLANT
ELECT REM PT RETURN 9FT ADLT (ELECTROSURGICAL) ×3
ELECTRODE REM PT RTRN 9FT ADLT (ELECTROSURGICAL) ×1 IMPLANT
GLOVE BIO SURGEON STRL SZ 6.5 (GLOVE) ×2 IMPLANT
GLOVE BIO SURGEONS STRL SZ 6.5 (GLOVE) ×1
GLOVE BIOGEL PI IND STRL 7.0 (GLOVE) ×2 IMPLANT
GLOVE BIOGEL PI IND STRL 8 (GLOVE) ×1 IMPLANT
GLOVE BIOGEL PI INDICATOR 7.0 (GLOVE) ×4
GLOVE BIOGEL PI INDICATOR 8 (GLOVE) ×2
GLOVE ECLIPSE 7.5 STRL STRAW (GLOVE) ×3 IMPLANT
GOWN STRL NON-REIN LRG LVL3 (GOWN DISPOSABLE) ×3 IMPLANT
KIT BASIN OR (CUSTOM PROCEDURE TRAY) ×3 IMPLANT
KIT ROOM TURNOVER OR (KITS) ×3 IMPLANT
LIGASURE IMPACT 36 18CM CVD LR (INSTRUMENTS) IMPLANT
NS IRRIG 1000ML POUR BTL (IV SOLUTION) ×6 IMPLANT
PACK GENERAL/GYN (CUSTOM PROCEDURE TRAY) ×3 IMPLANT
PAD ARMBOARD 7.5X6 YLW CONV (MISCELLANEOUS) ×3 IMPLANT
PENCIL BUTTON HOLSTER BLD 10FT (ELECTRODE) IMPLANT
PLUG CATH AND CAP STER (CATHETERS) ×3 IMPLANT
SEPRAFILM PROCEDURAL PACK 3X5 (MISCELLANEOUS) IMPLANT
SPECIMEN JAR LARGE (MISCELLANEOUS) IMPLANT
SPONGE GAUZE 4X4 12PLY STER LF (GAUZE/BANDAGES/DRESSINGS) ×3 IMPLANT
SPONGE LAP 18X18 X RAY DECT (DISPOSABLE) IMPLANT
STAPLER GUN LINEAR PROX 60 (STAPLE) ×3 IMPLANT
STAPLER PROXIMATE 75MM BLUE (STAPLE) ×3 IMPLANT
STAPLER VISISTAT 35W (STAPLE) ×3 IMPLANT
SUCTION POOLE TIP (SUCTIONS) ×3 IMPLANT
SUT ETHILON 2 0 FS 18 (SUTURE) ×3 IMPLANT
SUT NOVA 1 T20/GS 25DT (SUTURE) IMPLANT
SUT NOVA NAB DX-16 0-1 5-0 T12 (SUTURE) ×6 IMPLANT
SUT PDS AB 1 TP1 96 (SUTURE) ×6 IMPLANT
SUT SILK 2 0 SH CR/8 (SUTURE) ×3 IMPLANT
SUT SILK 2 0 TIES 10X30 (SUTURE) ×3 IMPLANT
SUT SILK 3 0 SH CR/8 (SUTURE) ×6 IMPLANT
SUT SILK 3 0 TIES 10X30 (SUTURE) ×3 IMPLANT
SYRINGE IRR TOOMEY STRL 70CC (SYRINGE) ×3 IMPLANT
TAPE CLOTH SURG 4X10 WHT LF (GAUZE/BANDAGES/DRESSINGS) ×3 IMPLANT
TOWEL OR 17X26 10 PK STRL BLUE (TOWEL DISPOSABLE) ×3 IMPLANT
TRAY FOLEY CATH 16FRSI W/METER (SET/KITS/TRAYS/PACK) IMPLANT
TUBE CONNECTING 12'X1/4 (SUCTIONS)
TUBE CONNECTING 12X1/4 (SUCTIONS) IMPLANT
YANKAUER SUCT BULB TIP NO VENT (SUCTIONS) ×3 IMPLANT

## 2013-07-02 NOTE — Preoperative (Signed)
Beta Blockers   Reason not to administer Beta Blockers:Not Applicable  Pt received IV lopressor 1147.

## 2013-07-02 NOTE — Progress Notes (Signed)
Consent form incorrectly filled out Spoke with Okey Regalarol on 3300 about consent sent back to to 3300 to be completed.  Will tube back when they complete the consent.

## 2013-07-02 NOTE — Progress Notes (Addendum)
NEURO HOSPITALIST PROGRESS NOTE   SUBJECTIVE:                                                                                                                        Sedated post abdominal surgery. No new neurological developments. PLEX 5/5 cycles completed. On Depakote 500 mg TID and keppra 1,000 mg BID Anti NMDA antibodies pending.  OBJECTIVE:                                                                                                                           Vital signs in last 24 hours: Temp:  [97.6 F (36.4 C)-99.7 F (37.6 C)] 97.6 F (36.4 C) (02/03 1638) Pulse Rate:  [103-139] 132 (02/03 1615) Resp:  [10-22] 15 (02/03 1638) BP: (98-127)/(37-89) 108/52 mmHg (02/03 1638) SpO2:  [99 %-100 %] 100 % (02/03 1638) Weight:  [76.1 kg (167 lb 12.3 oz)] 76.1 kg (167 lb 12.3 oz) (02/03 0500)  Intake/Output from previous day: 02/02 0701 - 02/03 0700 In: 2307.3 [I.V.:1725; NG/GT:27.3; IV Piggyback:495] Out: 300 [Emesis/NG output:300] Intake/Output this shift: Total I/O In: 1160 [I.V.:1050; IV Piggyback:110] Out: 150 [Emesis/NG output:100; Blood:50] Nutritional status: NPO  Past Medical History  Diagnosis Date  . Peripheral neuropathy   . Fibromyalgia   . Hypertension   . Anxiety   . Catatonia   . Seizures     Neurologic Exam:  Patient is very sedated and uncomfortable with post op pain and thus will defer neuro-exam at this moment.  Lab Results: No results found for this basename: cbc, bmp, coags, chol, tri, ldl, hga1c   Lipid Panel No results found for this basename: CHOL, TRIG, HDL, CHOLHDL, VLDL, LDLCALC,  in the last 72 hours  Studies/Results: Dg Abd Portable 1v  06/30/2013   CLINICAL DATA:  NG tube placement.  EXAM: PORTABLE ABDOMEN - 1 VIEW  COMPARISON:  Single view of the abdomen 06/27/2013. Upper GI/small bowel follow-through 06/28/2013  FINDINGS: NG tube is in place in good position with the tip in this distal stomach.  Contrast material in small bowel from the comparison upper GI series/small bowel follow-through. Small bowel loops are dilated. Only a small amount of loculated barium is seen in the colon.  IMPRESSION: NG tube in  good position.  Small bowel obstruction.   Electronically Signed   By: Drusilla Kannerhomas  Dalessio M.D.   On: 06/30/2013 19:01    MEDICATIONS                                                                                                                       I have reviewed the patient's current medications.  ASSESSMENT/PLAN:                                                                                                           53 y/o with presumed autoimmune encephalitis, s/p PLEX 5/5 days. Anti NMDA antibodies pending. Continue current AEDs No further neurological intervention at this time. Neurology will sign off.  [name] Portelasvaldo Roderick Calo, MD [facility] [phone]  07/02/2013, 5:01 PM

## 2013-07-02 NOTE — Anesthesia Postprocedure Evaluation (Signed)
Anesthesia Post Note  Patient: Joan [name]  Procedure(s) Performed: Procedure(s) (LRB): EXPLORATORY LAPAROTOMY with revision of jejunostomy feeding tube. (N/A) SMALL BOWEL RESECTION (N/A)  Anesthesia type: General  Patient location: PACU  Post pain: Pain level controlled and Adequate analgesia  Post assessment: Post-op Vital signs reviewed, Patient's Cardiovascular Status Stable, Respiratory Function Stable, Patent Airway and Pain level controlled  Last Vitals:  Filed Vitals:   07/02/13 1550  BP:   Pulse: 129  Temp:   Resp: 13    Post vital signs: Reviewed and stable  Level of consciousness: awake, alert  and oriented  Complications: No apparent anesthesia complications

## 2013-07-02 NOTE — Op Note (Signed)
OPERATIVE REPORT  DATE OF OPERATION: 06/19/2013 - 07/02/2013  PATIENT:  Joan [name]  53 y.o. female  PRE-OPERATIVE DIAGNOSIS:  Bowel obstruction  POST-OPERATIVE DIAGNOSIS:  Bowel obstruction  PROCEDURE:  Procedure(s): EXPLORATORY LAPAROTOMY with revision of jejunostomy feeding tube. SMALL BOWEL RESECTION  SURGEON:  Surgeon(s): Cherylynn RidgesJames O Paisleigh Maroney, MD  ASSISTANT: None  ANESTHESIA:   general  EBL: 50 ml  BLOOD ADMINISTERED: none  DRAINS: Nasogastric Tube and Jejunostomy Tube   SPECIMEN:  Source of Specimen:  resected small bowel  COUNTS CORRECT:  YES  PROCEDURE DETAILS: The patient was taken to the operating room and placed on the table in the supine position. After an adequate general endotracheal anesthetic was administered her previously placed J-tube was ligated at its neck and the abdomen prepped and draped in usual sterile manner.  A proper time out was performed identifying the patient and the procedures to be performed. We removed the midline staples using a hemostat clamp. I subsequently dissected down to the midline fascia with the surgeon's fingers. At the fascial level the previously placed interrupted Novafil sutures were encountered. Using a hemostat clamp I snipped the sutures of the previously placed fascial stitches were removed. As allowed us to enter the peritoneal cavity with minimal difficulty. There were minimal adhesions.  The previous jejunostomy tube was taken down at the intra-abdominal wall internally. The feeding tube did not seem to be overtly kinking the bowel however there was proximal dilatation of small bowel at the level of the previous jejunojejunostomy.  Once we took the J-tube down from the intra-abdominal wall we mobilized into the midline wound but we subsequently did a small bowel resection removing the previously placed jejunostomy tube area. Proximal and distal non-crushing bowel clamps were placed to minimize contamination.  A side-to-side  anastomosis was performed using a GIA-75 stapler, and a TA 60 stapler was used to reapproximate the subsequent enterotomy. The redundant small bowel was removed.  We subsequently placed the  jejunostomy feeding tube with multiple fenestrations placed in the tube approximately 10 cm distal to the new anastomosis. Brought out through the same site on the intra-abdominal wall in the left upper quadrant. It was sewn in place and a Witzel type manner using interrupted 3-0 silk sutures. We irrigated easily using saline and a Toomey syringe.  There was a small amount of enteric spillage into the midportion of which is irrigated easily and using a large amount of sterile saline. We subsequently closed the abdominal wall fascia after tacking the jejunostomy tube to the anterior abdominal wall using interrupted 3-0 silk sutures. Care was taken not to kink the bowel in the process of tacking it to wall. Once the fascia was closed we irrigated the subcutaneous tissue using saline then closed the skin using stainless steel staples. The tube was secured to the intra-abdominal wall using a 2-0 nylon suture. All counts were correct including needles, sponges, and instruments.  PATIENT DISPOSITION:  PACU - hemodynamically stable.   Cherylynn RidgesWYATT, [name] O 2/3/20153:02 PM

## 2013-07-02 NOTE — Clinical Social Work Note (Signed)
Updated clinical information has [facility]. CSW will continue to follow for DC needs.   Roddie McBryant Javed Cotto, North EnidLCSWA, [place]LCASA, [phone]

## 2013-07-02 NOTE — Progress Notes (Signed)
@  0130 Pt endorses generalized pain and anxiety unrelieved by Tylenol and Ativan as well as Tachycardia (130s). Pt currently confused. Merdis DelayK. Schorr of [facility] paged and notified. 1 mg Dilaudid ordered and administered @0159 . Pt sleeping comfortably at 0230. Tachycardia still present (130s). K. Schorr re-paged and one-time Lopressor IV order administered @~8469@~0315. Tachycardia reduced to 100s to 110s. Will continue to monitor and assess.

## 2013-07-02 NOTE — Transfer of Care (Signed)
Immediate Anesthesia Transfer of Care Note  Patient: Joan [name]  Procedure(s) Performed: Procedure(s): EXPLORATORY LAPAROTOMY with revision of jejunostomy feeding tube. (N/A) SMALL BOWEL RESECTION (N/A)  Patient Location: PACU  Anesthesia Type:General  Level of Consciousness: awake and patient cooperative  Airway & Oxygen Therapy: Patient Spontanous Breathing  Post-op Assessment: Report given to PACU RN, Post -op Vital signs reviewed and stable and Patient moving all extremities X 4  Post vital signs: Reviewed and stable  Complications: No apparent anesthesia complications

## 2013-07-02 NOTE — Anesthesia Preprocedure Evaluation (Addendum)
Anesthesia Evaluation  Patient identified by MRN, date of birth, ID band Patient confused    Reviewed: Allergy & Precautions, H&P , Patient's Chart, lab work & pertinent test results, reviewed documented beta blocker date and time , Unable to perform ROS - Chart review only  Airway Mallampati: II  Neck ROM: full    Dental  (+) Dental Advisory Given, Partial Lower, Partial Upper and Poor Dentition   Pulmonary neg pulmonary ROS,          Cardiovascular hypertension, Pt. on home beta blockers Rhythm:Regular Rate:Tachycardia     Neuro/Psych Seizures -,  PSYCHIATRIC DISORDERS Admitted with metabolic encephalopathy  Neuromuscular disease    GI/Hepatic   Endo/Other    Renal/GU      Musculoskeletal  (+) Fibromyalgia -  Abdominal   Peds  Hematology  (+) anemia ,   Anesthesia Other Findings   Reproductive/Obstetrics                         Anesthesia Physical Anesthesia Plan  ASA: [name] and emergent  Anesthesia Plan: General   Post-op Pain Management:    Induction: Intravenous  Airway Management Planned: Oral ETT  Additional Equipment:   Intra-op Plan:   Post-operative Plan: Extubation in OR and Possible Post-op intubation/ventilation  Informed Consent: I have reviewed the patients History and Physical, chart, labs and discussed the procedure including the risks, benefits and alternatives for the proposed anesthesia with the patient or authorized representative who has indicated his/her understanding and acceptance.   Dental advisory given  Plan Discussed with: CRNA and Surgeon  Anesthesia Plan Comments:        Anesthesia Quick Evaluation

## 2013-07-02 NOTE — Progress Notes (Signed)
[facility] PROGRESS NOTE  Joan [name] ZOX:[id number] DOB: [birth year] DOA: 06/19/2013 PCP: [name], MD  HPI/Subjective: 53 yr old female presents with a CC of Altered mental status. Pt has a PMH of peripheral neuropathy, seizures, catatonia, anxiety, HTN, and fibromyalgia. Pt was previously admitted on 12/13 thru 12/28 with toxic metabolic encephalopathy.  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  EEG showed no recurrent seizures. A 24 hour EEG  showed an intermittent delta pattern felt to represent Frontal Intermittent Rhythmic Delta Activity. Pt was started on two antiepileptics.  This admission, she was again found altered and seemed "comatose" . Since d/c in December, pt has had persistent confusion and is confined [facility] a [facility]. She is currently confused and experiencing visual hallucinations.She is s/p J tube placement and followed with intractable nausea, vomiting. Surgery following, and plan to take her to OR  Subjective: Patient is more alert, communicating. Denies any pain.Has been vomiting since yesterday , tube feeds have now been on hold. NG tube in place, s/p two units PRBC. No vomiting overnight. Says she wants surgery for the j tube.   Assessment/Plan:  Anemia ? 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 -Pt received diatech catheter placement and plasmapheresis on 06/20/13.   -Plan is for plasmapheresis every other day and Solumedrol 500 mg bid x 3 days. D/C on 1/24. -MRI of the brain showed no acute abnormality.  Detailed results listed below. -Ammonia level is normal - CT chest and transvaginal ultrasound showed no abnormalities. -[facility] paraneoplastic panel reported to be negative, MRI of the C-spine is negative for acute findings - Continue with Ativan  prn. Pending:   - serum NMDA.   Tachycardia - Patient continues to have sinus tachycardia secondary to constant vomiting. Continue with IV metoprolol 5 mg q  6 hrs.  Hx of Convulsions/Seizures  -Pt has a PMH of seizures and catatonia.   -MRI of the brain showed thalamic signal abnormality during previous admission.  No longer present on current MRI. -Pt currently taking Keppra and Depakene.  Increased Depakene to 500mg  tid -Valproic acid lvl 35.1 was low.  Repeat Depakote level is 76.6  Hypokalemia - Repleted  Nausea&Vomiting Started after tube feeds was initiated Tuber feeds held overnight as per surgery recommendation. Continue zofran, phenergan prn. NG Tube now inserted Plan is for revision of the j tube per surgery   Hx of Anxiety  -Continue pt on Remeron -Patient appears to have had a psychiatric history  -Current illness is bringing out psychiatric symptoms. -Psychiatry consulted as psychotropic medications may help with her current symptoms. -Psych prescribed Risperdal .5 mg bid Ativan prn for anxiety.  Hypertension  -Pt has a PMH of HTN and most recent BP reading was 151/108 -Likely secondary to anxiety and high dose steroids. Continue metoprolol  Malnutrition with history of Gastric Bypass -Per SNF patient has had no PO intake for over 1 week prior to admission. -SNF sent her to hospital for J tube placement. -Gen Surg consulted, J-tube placed  Phosphate deficiency - K phos IV  X 1  Will check phosphorus in am.  Recent hx of c-diff (05/18/13)  -Pt negative for C-diff at this admit  Thrombocytopenia -Discontinue heparin, seems to be chronic up and down, could be secondary to folate deficiency.  Enterovaginal fistula -Reported prior  to that this patient had some stools in her vagina. -Discussed with radiology, no evidence of rectovaginal fistula and the previous imaging, (patient had indicated pelvic scan done previously). - Consulted GI, and the did not  feel that patient will benefit from colonoscopy for the diagnosis of fistula. - Called and discussed with Gyn on-call, and they recommend to follow the patient as outpatient. No further workup required in the hospital.  DVT Prophylaxis:  SCD  Code Status: Full Code Family Communication: Son was contacted to explain procedures and status Disposition Plan: D/C to SNF when medically appropriate.   Consultants:  Psych  Neuro  Procedures:  Plasmapheresis  PICC Line Placement  Antibiotics:  None  Family communication Called and discussed with son Eusebio Me on phone  Objective: Filed Vitals:   07/02/13 0600 07/02/13 0900 07/02/13 1000 07/02/13 1200  BP: 102/73 98/56 109/58 102/46  Pulse: 121 124 132 128  Temp:  99.1 F (37.3 C)  98.5 F (36.9 C)  TempSrc:  Oral  Oral  Resp: 12 16 22 15   Height:      Weight:      SpO2: 99% 100% 100% 99%    Intake/Output Summary (Last 24 hours) at 07/02/13 1435 Last data filed at 07/02/13 1200  Gross per 24 hour  Intake 2737.5 ml  Output    400 ml  Net 2337.5 ml   Filed Weights   06/30/13 0408 07/01/13 0356 07/02/13 0500  Weight: 76.3 kg (168 lb 3.4 oz) 75.9 kg (167 lb 5.3 oz) 76.1 kg (167 lb 12.3 oz)    Exam: Physical Exam: Head: Normocephalic, atraumatic.  Eyes: No signs of jaundice, EOMI Nose: NG tube in place Throat: Oropharynx nonerythematous, no exudate appreciated.  Neck: supple,No deformities, masses, or tenderness noted. Lungs: Normal respiratory effort. B/L Clear to auscultation, no crackles or wheezes.  Heart: Regular RR. S1 and S2 normal  Abdomen: BS normoactive. Soft, Nondistended, non-tender.  Extremities: No pretibial edema, no erythema      Data Reviewed: Basic Metabolic Panel:  Recent Labs Lab 06/28/13 0550 06/28/13 2236 06/29/13 0500 06/29/13 0600 06/30/13 0500 07/01/13 0620 07/02/13 0350  NA 144 146 144  --  145 143 145  K 3.2* 3.0* 3.4*  --  3.4* 3.9 3.8  CL 110 105 113*  --  110 106  107  CO2 21  --  20  --  23 24 27   GLUCOSE 90 102* 90  --  99 90 86  BUN <3* <3* <3*  --  <3* <3* <3*  CREATININE 0.52 0.60 0.49*  --  0.51 0.52 0.56  CALCIUM 8.0*  --  8.1*  --  8.1* 8.0* 8.0*  MG  --   --   --   --  1.6  --   --   PHOS  --   --   --  1.9*  --  3.5  --    Liver Function Tests:  Recent Labs Lab 06/27/13 1640  AST 12  ALT 9  ALKPHOS 16*  BILITOT 0.7  PROT 4.6*  ALBUMIN 3.1*   No results found for this basename: AMMONIA,  in the last 168 hours CBC:  Recent Labs Lab 06/28/13 1800 06/28/13 2236 06/29/13 0500 06/30/13 0500 07/01/13 0620 07/02/13 0350  WBC 6.9  --  5.8 5.9 7.1 7.3  HGB 9.3* 9.2* 9.3* 9.0* 9.4* 9.2*  HCT 27.1* 27.0* 26.8* 26.2* 28.5* 27.9*  MCV 91.2  --  91.5 92.6 94.4 96.2  PLT 73*  --  81* 85* 96* 99*   CBG:  Recent Labs Lab 06/30/13 0730 06/30/13 1105 06/30/13 1457 07/01/13 0727 07/02/13 0835  GLUCAP 106* 117* 128* 94 78    Recent Results (from the past 240 hour(s))  CULTURE, BLOOD (ROUTINE X 2)     Status: None   Collection Time    06/23/13  2:02 PM      Result Value Range Status   Specimen Description BLOOD LEFT ARM   Final   Special Requests BOTTLES DRAWN AEROBIC ONLY 10CC   Final   Culture  Setup Time     Final   Value: 06/23/2013 19:55     Performed at [facility]   Culture     Final   Value: NO GROWTH 5 DAYS     Performed at [facility]   Report Status 06/29/2013 FINAL   Final  CULTURE, BLOOD (ROUTINE X 2)     Status: None   Collection Time    06/23/13  2:14 PM      Result Value Range Status   Specimen Description BLOOD LEFT HAND   Final   Special Requests BOTTLES DRAWN AEROBIC ONLY 5.5 CC   Final   Culture  Setup Time     Final   Value: 06/23/2013 19:55     Performed at [facility]   Culture     Final   Value: NO GROWTH 5 DAYS     Performed at [facility]   Report Status 06/29/2013 FINAL   Final  CLOSTRIDIUM DIFFICILE BY PCR     Status: None   Collection Time     06/23/13  5:08 PM      Result Value Range Status   C difficile by pcr NEGATIVE  NEGATIVE Final  URINE CULTURE     Status: None   Collection Time    06/23/13  6:15 PM      Result Value Range Status   Specimen Description URINE, CLEAN CATCH   Final   Special Requests NONE   Final   Culture  Setup Time     Final   Value: 06/24/2013 01:22     Performed at [facility]   Colony Count     Final   Value: NO GROWTH     Performed at [facility]   Culture     Final   Value: NO GROWTH     Performed at [facility]   Report Status 06/24/2013 FINAL   Final     Studies: Dg Abd Portable 1v  06/30/2013   CLINICAL DATA:  NG tube placement.  EXAM: PORTABLE ABDOMEN - 1 VIEW  COMPARISON:  Single view of the abdomen 06/27/2013. Upper GI/small bowel follow-through 06/28/2013  FINDINGS: NG tube is in place in good position with the tip in this distal stomach. Contrast material in small bowel from the comparison upper GI series/small bowel follow-through. Small bowel loops are dilated. Only a small amount of loculated barium is seen in the colon.  IMPRESSION: NG tube in good position.  Small bowel obstruction.   Electronically Signed   By: Drusilla Kannerhomas  Dalessio M.D.   On: 06/30/2013 19:01    Scheduled Meds: . [facility][MAR HOLD] chlorhexidine  15 mL Mouth Rinse BID  . [MAR HOLD] levETIRAcetam  1,000 mg Intravenous Q12H  . [facility][MAR HOLD] metoprolol  5 mg Intravenous Q6H  . [MAR HOLD] mirtazapine  30 mg Oral QHS  . [MAR HOLD] multivitamin with minerals  1 tablet  Oral Daily  . [MAR HOLD] pantoprazole (PROTONIX) IV  40 mg Intravenous Q12H  . [facility] HOLD] risperiDONE  0.5 mg Oral BID  . [MAR HOLD] sodium chloride  10 mL Intravenous Q12H  . [facility] HOLD] thiamine  100 mg Oral Daily  . [facility] HOLD] valproate sodium  500 mg Intravenous Q8H   Continuous Infusions: . citrate dextrose    . dextrose 5 % and 0.45 % NaCl with KCl 20 mEq/L 75 mL/hr at 07/02/13 0902  . feeding supplement (OSMOLITE 1.5 CAL) Stopped  (07/02/13 0000)    Principal Problem:   Acute encephalopathy Active Problems:   Convulsions/seizures   Toxic metabolic encephalopathy   Psychosis   Nausea with vomiting    [facility] Pager [phone]. If 7PM-7AM, please contact night-coverage at [email or web address], password [facility] 07/02/2013, 2:35 PM  LOS: 13 days

## 2013-07-02 NOTE — Progress Notes (Signed)
NUTRITION FOLLOW-UP  DOCUMENTATION CODES Per approved criteria  -Not Applicable   INTERVENTION: When ready to resume feedings, recommend resumption of Osmolite 1.2 at 20 ml/hr, advance by 10 ml q 12 hours to goal rate of 45 ml/hr. Goal rate will provide: 1620 kcal, 68 grams protein, 823 ml free water. *Pt would benefit from slow advancement and close monitoring of electrolytes given refeeding syndrome risk.* Monitor magnesium, potassium, and phosphorus daily for at least 3 days, MD to replete as needed, as pt is at risk for refeeding syndrome given prolonged poor oral intake.  RD to continue to follow nutrition care plan.  NUTRITION DIAGNOSIS: Inadequate oral intake related to AMS as evidenced by limited oral intake.  Ongoing.  Goal: Intake to meet >90% of estimated nutrition needs. Not met yet.  Monitor:  weight trends, lab trends, I/O's, TF tolerance and adequacy  ASSESSMENT: PMHx significant for catatonia, HTN, anxiety, gastric bypass. From SNF.  Recent admission from 12/13 - 12/28 with toxic metabolic encephalopathy, hospital course was complicated with pyelonephritis and C. Difficile colitis. Pt was scheduled for PEG on 1/20, was found to be agitated and confused and was sent to ED.   Neuro saw pt on 1/21 and recommended plasma exchanges every other day as well as suppressive steroids to reduce her seizure foci.  Pt developed frequent amount of large stooling from vagina on 1/25 and was sent to SDU. Remains in SDU at this time.  1/27 - Underwent open J-tube placement 1/28 - Osmolite 1.5 at 10 ml/hr initiated; pt developed vomiting after feedings were started and anti-emetics were not effective. 1/29 - Feeds were re-attempted overnight.  1/30 - NGT placed to LWIS; 250 ml output; Osmolite 1.5 resumed at 20 ml/hr 1/31 - NGT in place to LWIS; 1000 ml output; Osmolite 1.5 at 20 ml/hr 2/1 - NGT in place to LWIS; 950 ml output; pt pulled out NGT later in evening; Osmolite 1.5 at 20  ml/hr 2/2 - NGT replaced to LWIS; 300 ml output; Osmolite 1.5 at 20 ml/hr 2/3 - NGT in place LWIS; 100 ml/hr output per RN; Osmolite 1.5 on hold 2/2 pt to surgery this PM  Per surgery, pt's j-tube is likely too big for lumen as there does not appear to be contrast passing by the tube vs kink in tube. Team recommends returning to OR. Pt to go to OR this afternoon.  Potassium now WNL. Prealbumin is low at 11.7  Height: Ht Readings from Last 1 Encounters:  06/29/13 _0  (1.6 m)    Weight: Wt Readings from Last 1 Encounters:  07/02/13 167 lb 12.3 oz (76.1 kg)  Admit wt 152 lb  BMI:  Body mass index is 29.73 kg/(m^2). Overweight  Estimated Nutritional Needs: Kcal: 1600 - 1800 Protein: 75 - 90 g Fluid: 1.8 - 2 liters  Skin: abdomen incision  Diet Order: NPO   EDUCATION NEEDS: -No education needs identified at this time   Intake/Output Summary (Last 24 hours) at 07/02/13 1022 Last data filed at 07/02/13 0902  Gross per 24 hour  Intake   2515 ml  Output    350 ml  Net   2165 ml    Last BM: 2/2  Labs:   Recent Labs Lab 06/29/13 0500 06/29/13 0600 06/30/13 0500 07/01/13 0620 07/02/13 0350  NA 144  --  145 143 145  K 3.4*  --  3.4* 3.9 3.8  CL 113*  --  110 106 107  CO2 20  --  23 24 27  BUN <3*  --  <3* <3* <3*  CREATININE 0.49*  --  0.51 0.52 0.56  CALCIUM 8.1*  --  8.1* 8.0* 8.0*  MG  --   --  1.6  --   --   PHOS  --  1.9*  --  3.5  --   GLUCOSE 90  --  99 90 86    CBG (last 3)   Recent Labs  06/30/13 1457 07/01/13 0727 07/02/13 0835  GLUCAP 128* 94 78   Prealbumin  Date/Time Value Range Status  06/24/2013  4:30 PM 11.7* 17.0 - 34.0 mg/dL Final     Performed at [facility]    Scheduled Meds: . chlorhexidine  15 mL Mouth Rinse BID  . levETIRAcetam  1,000 mg Intravenous Q12H  . metoprolol  5 mg Intravenous Q6H  . mirtazapine  30 mg Oral QHS  . multivitamin with minerals  1 tablet Oral Daily  . pantoprazole (PROTONIX) IV  40 mg  Intravenous Q12H  . risperiDONE  0.5 mg Oral BID  . sodium chloride  10 mL Intravenous Q12H  . thiamine  100 mg Oral Daily  . valproate sodium  500 mg Intravenous Q8H    Continuous Infusions: . citrate dextrose    . dextrose 5 % and 0.45 % NaCl with KCl 20 mEq/L 75 mL/hr at 07/02/13 0902  . feeding supplement (OSMOLITE 1.5 CAL) Stopped (07/02/13 0000)    Inda Coke MS, RD, LDN Pager: [phone] After-hours pager: [phone]

## 2013-07-03 ENCOUNTER — Inpatient Hospital Stay (HOSPITAL_COMMUNITY): Payer: Medicare Other

## 2013-07-03 DIAGNOSIS — K9413 Enterostomy malfunction: Secondary | ICD-10-CM | POA: Diagnosis present

## 2013-07-03 DIAGNOSIS — K9403 Colostomy malfunction: Secondary | ICD-10-CM

## 2013-07-03 DIAGNOSIS — Z9049 Acquired absence of other specified parts of digestive tract: Secondary | ICD-10-CM

## 2013-07-03 LAB — BASIC METABOLIC PANEL
BUN: 5 mg/dL — ABNORMAL LOW (ref 6–23)
CALCIUM: 7.7 mg/dL — AB (ref 8.4–10.5)
CO2: 23 meq/L (ref 19–32)
Chloride: 107 mEq/L (ref 96–112)
Creatinine, Ser: 0.62 mg/dL (ref 0.50–1.10)
GFR calc Af Amer: >90 mL/min (ref >90)
GFR calc non Af Amer: >90 mL/min (ref >90)
GLUCOSE: 104 mg/dL — AB (ref 70–99)
Potassium: 4.5 mEq/L (ref 3.7–5.3)
SODIUM: 143 meq/L (ref 137–147)

## 2013-07-03 LAB — CBC
HEMATOCRIT: 30.4 % — AB (ref 36.0–46.0)
Hemoglobin: 9.9 g/dL — ABNORMAL LOW (ref 12.0–15.0)
MCH: 31.5 pg (ref 26.0–34.0)
MCHC: 32.6 g/dL (ref 30.0–36.0)
MCV: 96.8 fL (ref 78.0–100.0)
PLATELETS: 108 10*3/uL — AB (ref 150–400)
RBC: 3.14 MIL/uL — AB (ref 3.87–5.11)
RDW: 18.4 % — ABNORMAL HIGH (ref 11.5–15.5)
WBC: 14.6 10*3/uL — ABNORMAL HIGH (ref 4.0–10.5)

## 2013-07-03 LAB — GLUCOSE, CAPILLARY
Glucose-Capillary: 137 mg/dL — ABNORMAL HIGH (ref 70–99)
Glucose-Capillary: 99 mg/dL (ref 70–99)

## 2013-07-03 LAB — URINALYSIS, ROUTINE W REFLEX MICROSCOPIC
Bilirubin Urine: NEGATIVE
Glucose, UA: NEGATIVE mg/dL
Hgb urine dipstick: NEGATIVE
Ketones, ur: NEGATIVE mg/dL
Leukocytes, UA: NEGATIVE
Nitrite: NEGATIVE
Protein, ur: NEGATIVE mg/dL
Specific Gravity, Urine: 1.014 (ref 1.005–1.030)
Urobilinogen, UA: 0.2 mg/dL (ref 0.0–1.0)
pH: 5 (ref 5.0–8.0)

## 2013-07-03 MED ORDER — ACETAMINOPHEN 650 MG RE SUPP
650.0000 mg | RECTAL | Status: DC | PRN
  Administered 2013-07-03 – 2013-07-10 (×2): 650 mg via RECTAL
  Filled 2013-07-03 (×2): qty 1

## 2013-07-03 MED ORDER — SODIUM CHLORIDE 0.9 % IV SOLN: INTRAVENOUS | Status: DC

## 2013-07-03 MED ORDER — ACETAMINOPHEN 325 MG PO TABS: 650.0000 mg | ORAL_TABLET | ORAL | Status: DC | PRN

## 2013-07-03 NOTE — Progress Notes (Signed)
Pt's HR 130-140s BP 90s/50s. NP notified. New orders received for 500 cc bolus. Will administer and continue to monitor.

## 2013-07-03 NOTE — Progress Notes (Addendum)
Pt's BP 70s-80s/40s and temperature 101.4 (Rectal). NP notified. BP now 90s-40s. New orders received. Will carry out orders and continue to monitor.

## 2013-07-03 NOTE — Progress Notes (Signed)
1 Day Post-Op  Subjective: Tachycardic this am  Objective: Vital signs in last 24 hours: Temp:  [97.6 F (36.4 C)-101.4 F (38.6 C)] 98.3 F (36.8 C) (02/04 0742) Pulse Rate:  [116-140] 139 (02/04 0430) Resp:  [10-22] 11 (02/04 0430) BP: (74-124)/(35-85) 104/39 mmHg (02/04 0430) SpO2:  [98 %-100 %] 98 % (02/04 0430) Weight:  [169 lb 12.1 oz (77 kg)] 169 lb 12.1 oz (77 kg) (02/04 0500) Last BM Date: 07/01/13  Intake/Output from previous day: 02/03 0701 - 02/04 0700 In: 1940 [I.V.:1800; IV Piggyback:110] Out: 350 [Emesis/NG output:300; Blood:50] Intake/Output this shift:    GI: j tube in place, wound dressing clean, approp tender, not really distended  Lab Results:   Recent Labs  07/02/13 0350 07/03/13 0355  WBC 7.3 14.6*  HGB 9.2* 9.9*  HCT 27.9* 30.4*  PLT 99* 108*   BMET  Recent Labs  07/02/13 0350 07/03/13 0355  NA 145 143  K 3.8 4.5  CL 107 107  CO2 27 23  GLUCOSE 86 104*  BUN <3* 5*  CREATININE 0.56 0.62  CALCIUM 8.0* 7.7*   PT/INR No results found for this basename: LABPROT, INR,  in the last 72 hours ABG No results found for this basename: PHART, PCO2, PO2, HCO3,  in the last 72 hours  Studies/Results: Dg Chest Port 1 View  07/03/2013   CLINICAL DATA:  Tubes and lines in good position, detailed above.  EXAM: PORTABLE CHEST - 1 VIEW  COMPARISON:  Chest CT 06/21/2013  FINDINGS: There is nasogastric tube which terminates near the pylorus. Right IJ catheter, tip in the upper right atrium. Right upper extremity PICC, tip at the upper cavoatrial junction.  Oral contrast noted within the gastric fundus and small bowel.  Haziness of the right base, with volume loss. Node definitive consolidation.  No cardiomegaly.  IMPRESSION: 1. Negative for pneumonia. 2. Haziness of the right lower chest, likely pleural fluid or atelectasis. 3. Tubes and lines in good position, detailed above.   Electronically Signed   By: Tiburcio PeaJonathan  Watts M.D.   On: 07/03/2013 03:35     Anti-infectives: Anti-infectives   Start     Dose/Rate Route Frequency Ordered Stop   07/02/13 1330  ciprofloxacin (CIPRO) IVPB 400 mg     400 mg 200 mL/hr over 60 Minutes Intravenous To Surgery 07/02/13 1321 07/02/13 1326   06/23/13 1400  piperacillin-tazobactam (ZOSYN) IVPB 3.375 g  Status:  Discontinued     3.375 g 12.5 mL/hr over 240 Minutes Intravenous 3 times per day 06/23/13 1336 06/26/13 1441   06/20/13 1200  vancomycin (VANCOCIN) IVPB 1000 mg/200 mL premix     1,000 mg 200 mL/hr over 60 Minutes Intravenous  Once 06/20/13 1146 06/20/13 1433      Assessment/Plan: Pod 1 revision j tube  hct is ok today, do not think tachycardia is blood loss, likely combination of factors including fever, recent surgery, intravascular volume depletion. Will place foley, increase iv fluids and monitor for now Would cont ng for now, do not start using j tube as of yet  [facility] LLC[facility]KEFIELD,Chelan Heringer 07/03/2013

## 2013-07-03 NOTE — Clinical Social Work Note (Signed)
CSW spoke with son at length about disposition. Patient's son Joan [name] is concerned because he states that he has not spoken with MD about patient for while and would like an update. CSW has text paged MD to alert MD. Patient's son is apprehensive about patient returning to [place] before she is "ready". CSW explained that MD will make this determination. CSW will continue to follow.   Roddie McBryant Michala Deblanc, [place], [place], [phone]

## 2013-07-03 NOTE — Progress Notes (Addendum)
[facility] PROGRESS NOTE  Joan [name] ZOX:[id number] DOB: Sep 30, 1960 DOA: 06/19/2013 PCP: [name], MD  HPI/Subjective: 53 yr old female presents with a CC of Altered mental status. Pt has a PMH of peripheral neuropathy, seizures, catatonia, anxiety, HTN, and fibromyalgia. Pt was previously admitted on 12/13 thru 12/28 with toxic metabolic encephalopathy.  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  EEG showed no recurrent seizures. A 24 hour EEG  showed an intermittent delta pattern felt to represent Frontal Intermittent Rhythmic Delta Activity. Pt was started on two antiepileptics.  This admission, she was again found altered and seemed "comatose" . Since d/c in December, pt has had persistent confusion and is confined to a wheelchair at [facility]. She is currently confused and experiencing visual hallucinations.She is s/p J tube placement and followed with intractable nausea, vomiting. Surgery following, . Overnight pt developed fever of 101.4, became tachycardic and hypotensive. CXR, done , UA ordered and blood cultures pending.  H&H remains stable at 9.9 , but was found to have new leukocytosis.  Subjective: Patient is sleeping comfortably, . Not in any distress, denies any pain or sob on asking, coughing a little,. Not bringing up any sputum.  Had fver overnight, blood cultures done and pending. UA is pending.    Assessment/Plan:  Anemia ? 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  -Pt received diatech catheter placement and plasmapheresis on 06/20/13.   -Plan is for plasmapheresis every other day and Solumedrol 500 mg bid x 3 days. D/C on 1/24. -MRI of the brain showed no acute abnormality.  Detailed results listed below. -Ammonia level is normal -  CT chest and transvaginal ultrasound showed no abnormalities. -[facility] paraneoplastic panel reported to be negative, MRI of the C-spine is negative for acute findings - Continue with Ativan prn. Pending:   - serum NMDA.   Tachycardia - Patient continues to have sinus tachycardia probably secondary to hypotension from volume depletion and fever.  - appears to be sinus tachy, will get 12 lead EKG .  -Continue with IV metoprolol 5 mg q  6 hrs.  Hx of Convulsions/Seizures  -Pt has a PMH of seizures and catatonia.   -MRI of the brain showed thalamic signal abnormality during previous admission.  No longer present on current MRI. -Pt currently taking Keppra and Depakene.  Increased Depakene to 500mg  tid -Valproic acid lvl 35.1 was low.  Repeat Depakote level is 76.6  Hypokalemia - Repleted  Nausea&Vomiting Started after tube feeds was initiated Tuber feeds held  as per surgery recommendation. Continue zofran, phenergan prn. NG Tube now inserted She underwent revision of the j tube per surgery and small bowel resection.    Hx of Anxiety  -Continue pt on Remeron -Patient appears to have had a psychiatric history  -Current illness is bringing out psychiatric symptoms. -Psychiatry consulted as psychotropic medications may help with her current symptoms. -Psych prescribed Risperdal .5 mg bid Ativan prn for anxiety.  Hypertension  -Pt has a PMH of HTN and most recent BP reading was 151/108 -Likely secondary to anxiety and high dose steroids. Continue metoprolol  Malnutrition with history of Gastric Bypass -Per SNF patient has had no PO intake for over 1 week prior to admission. -SNF sent her to hospital for J tube placement. -  Gen Surg consulted, J-tube placed and underwent revision of j tube withexploratory laparotomy on 2/3 and small bowel resection.   Phosphate deficiency - K phos IV  X 1  Will check phosphorus in am.  Recent hx of c-diff (05/18/13)  -Pt negative for C-diff at  this admit  Thrombocytopenia -Discontinue heparin, seems to be chronic up and down, could be secondary to folate deficiency.  Enterovaginal fistula -Reported prior to that this patient had some stools in her vagina. -Discussed with radiology, no evidence of rectovaginal fistula and the previous imaging, (patient had indicated pelvic scan done previously). - Consulted GI, and the did not feel that patient will benefit from colonoscopy for the diagnosis of fistula. - Called and discussed with Gyn on-call, and they recommend to follow the patient as outpatient. No further workup required in the hospital.  Fever:  Unclear etiology. CXR negative for pneumonia, right lower lobe atelectasis. UA PENDING. Blood cultures done and pending.  Repeat labs in am show leukocytosis. Plan to get UA and start antibiotics if needed.   DVT Prophylaxis:  SCD  Code Status: Full Code Family Communication: Son was contacted to explain procedures and status Disposition Plan: D/C to SNF when medically appropriate.   Consultants:  Surgery  Neuro  Procedures:  Plasmapheresis  PICC Line Placement  Antibiotics:  None  Family communication None at bedside.   Objective: Filed Vitals:   07/03/13 0330 07/03/13 0430 07/03/13 0500 07/03/13 0742  BP: 96/49 104/39    Pulse: 138 139    Temp:  101.1 F (38.4 C)  98.3 F (36.8 C)  TempSrc:  Rectal  Oral  Resp: 14 11    Height:      Weight:   77 kg (169 lb 12.1 oz)   SpO2: 99% 98%      Intake/Output Summary (Last 24 hours) at 07/03/13 0838 Last data filed at 07/03/13 0500  Gross per 24 hour  Intake   1940 ml  Output    350 ml  Net   1590 ml   Filed Weights   07/01/13 0356 07/02/13 0500 07/03/13 0500  Weight: 75.9 kg (167 lb 5.3 oz) 76.1 kg (167 lb 12.3 oz) 77 kg (169 lb 12.1 oz)    Exam: Physical Exam: sleeping comfortably, no distress.  Eyes: No signs of jaundice, pale conjunctiva,  EOMI Nose: NG tube in place Throat: Oropharynx  nonerythematous, no exudate appreciated.  Neck: supple,No deformities, masses, or tenderness noted. Lungs: Normal respiratory effort. Scattered rhonchi bilaterally.  Heart: tachycardic regular. S1 and S2 normal  Abdomen: BS normoactive. Soft, Nondistended, non-tender.  Extremities: No pretibial edema, no erythema      Data Reviewed: Basic Metabolic Panel:  Recent Labs Lab 06/29/13 0500 06/29/13 0600 06/30/13 0500 07/01/13 0620 07/02/13 0350 07/03/13 0355  NA 144  --  145 143 145 143  K 3.4*  --  3.4* 3.9 3.8 4.5  CL 113*  --  110 106 107 107  CO2 20  --  23 24 27 23   GLUCOSE 90  --  99 90 86 104*  BUN <3*  --  <3* <3* <3* 5*  CREATININE 0.49*  --  0.51 0.52 0.56 0.62  CALCIUM 8.1*  --  8.1* 8.0* 8.0* 7.7*  MG  --   --  1.6  --   --   --   PHOS  --  1.9*  --  3.5  --   --    Liver Function Tests:  Recent Labs Lab 06/27/13  1640  AST 12  ALT 9  ALKPHOS 16*  BILITOT 0.7  PROT 4.6*  ALBUMIN 3.1*   No results found for this basename: AMMONIA,  in the last 168 hours CBC:  Recent Labs Lab 06/29/13 0500 06/30/13 0500 07/01/13 0620 07/02/13 0350 07/03/13 0355  WBC 5.8 5.9 7.1 7.3 14.6*  HGB 9.3* 9.0* 9.4* 9.2* 9.9*  HCT 26.8* 26.2* 28.5* 27.9* 30.4*  MCV 91.5 92.6 94.4 96.2 96.8  PLT 81* 85* 96* 99* 108*   CBG:  Recent Labs Lab 06/30/13 0730 06/30/13 1105 06/30/13 1457 07/01/13 0727 07/02/13 0835  GLUCAP 106* 117* 128* 94 78    Recent Results (from the past 240 hour(s))  CULTURE, BLOOD (ROUTINE X 2)     Status: None   Collection Time    06/23/13  2:02 PM      Result Value Range Status   Specimen Description BLOOD LEFT ARM   Final   Special Requests BOTTLES DRAWN AEROBIC ONLY 10CC   Final   Culture  Setup Time     Final   Value: 06/23/2013 19:55     Performed at [facility]   Culture     Final   Value: NO GROWTH 5 DAYS     Performed at [facility]   Report Status 06/29/2013 FINAL   Final  CULTURE, BLOOD (ROUTINE X 2)      Status: None   Collection Time    06/23/13  2:14 PM      Result Value Range Status   Specimen Description BLOOD LEFT HAND   Final   Special Requests BOTTLES DRAWN AEROBIC ONLY 5.5 CC   Final   Culture  Setup Time     Final   Value: 06/23/2013 19:55     Performed at [facility]   Culture     Final   Value: NO GROWTH 5 DAYS     Performed at [facility]   Report Status 06/29/2013 FINAL   Final  CLOSTRIDIUM DIFFICILE BY PCR     Status: None   Collection Time    06/23/13  5:08 PM      Result Value Range Status   C difficile by pcr NEGATIVE  NEGATIVE Final  URINE CULTURE     Status: None   Collection Time    06/23/13  6:15 PM      Result Value Range Status   Specimen Description URINE, CLEAN CATCH   Final   Special Requests NONE   Final   Culture  Setup Time     Final   Value: 06/24/2013 01:22     Performed at [facility]   Colony Count     Final   Value: NO GROWTH     Performed at [facility]   Culture     Final   Value: NO GROWTH     Performed at [facility]   Report Status 06/24/2013 FINAL   Final     Studies: Dg Chest Port 1 View  07/03/2013   CLINICAL DATA:  Tubes and lines in good position, detailed above.  EXAM: PORTABLE CHEST - 1 VIEW  COMPARISON:  Chest CT 06/21/2013  FINDINGS: There is nasogastric tube which terminates near the pylorus. Right IJ catheter, tip in the upper right atrium. Right upper extremity PICC, tip at the upper cavoatrial junction.  Oral contrast noted within the gastric fundus and small bowel.  Haziness of the right base, with volume loss. Node  definitive consolidation.  No cardiomegaly.  IMPRESSION: 1. Negative for pneumonia. 2. Haziness of the right lower chest, likely pleural fluid or atelectasis. 3. Tubes and lines in good position, detailed above.   Electronically Signed   By: Tiburcio Pea M.D.   On: 07/03/2013 03:35    Scheduled Meds: . calcium gluconate IVPB  4 g Intravenous Once  . chlorhexidine   15 mL Mouth Rinse BID  . heparin  1,000 Units Intracatheter Once  . levETIRAcetam  1,000 mg Intravenous Q12H  . metoprolol  5 mg Intravenous Q6H  . mirtazapine  30 mg Oral QHS  . multivitamin with minerals  1 tablet Oral Daily  . pantoprazole (PROTONIX) IV  40 mg Intravenous Q12H  . risperiDONE  0.5 mg Oral BID  . sodium chloride  10 mL Intravenous Q12H  . thiamine  100 mg Oral Daily  . valproate sodium  500 mg Intravenous Q8H   Continuous Infusions: . citrate dextrose    . dextrose 5 % and 0.45 % NaCl with KCl 20 mEq/L 125 mL/hr at 07/03/13 1610    Principal Problem:   Acute encephalopathy Active Problems:   Convulsions/seizures   Toxic metabolic encephalopathy   Psychosis   Nausea with vomiting    Serenidy Waltz [facility] Pager [phone]. If 7PM-7AM, please contact night-coverage at [email or web address], password [facility] 07/03/2013, 8:38 AM  LOS: 14 days

## 2013-07-03 NOTE — Progress Notes (Addendum)
Pt's HR 140s. BP 90s/40s-50s. NP notified. New orders received. Will administer and continue to monitor.

## 2013-07-03 NOTE — Progress Notes (Signed)
Pt HR 130s. BP 90s-100s/40s-50s. [name] MD notified. No new orders received. Will continue to monitor.

## 2013-07-03 NOTE — Progress Notes (Signed)
NUTRITION FOLLOW-UP  DOCUMENTATION CODES Per approved criteria  -Not Applicable   INTERVENTION: When ready to resume feedings, recommend resumption of Osmolite 1.2 at 20 ml/hr, advance by 10 ml q 12 hours to goal rate of 45 ml/hr. Goal rate will provide: 1620 kcal, 68 grams protein, 823 ml free water. *Pt would benefit from slow advancement and close monitoring of electrolytes given refeeding syndrome risk.* Monitor magnesium, potassium, and phosphorus daily throughout advancement of nutrition support, MD to replete as needed, as pt is at risk for refeeding syndrome given prolonged poor oral intake.  RD to continue to follow nutrition care plan.  NUTRITION DIAGNOSIS: Inadequate oral intake related to AMS as evidenced by limited oral intake.  Ongoing.  Goal: Intake to meet >90% of estimated nutrition needs. Not met yet.  Monitor:  weight trends, lab trends, I/O's, TF tolerance and adequacy  ASSESSMENT: PMHx significant for catatonia, HTN, anxiety, gastric bypass. From SNF.  Recent admission from 12/13 - 12/28 with toxic metabolic encephalopathy, hospital course was complicated with pyelonephritis and C. Difficile colitis. Pt was scheduled for PEG on 1/20, was found to be agitated and confused and was sent to ED.   Neuro saw pt on 1/21 and recommended plasma exchanges every other day as well as suppressive steroids to reduce her seizure foci.  Pt developed frequent amount of large stooling from vagina on 1/25 and was sent to SDU. Remains in SDU at this time.  1/27 - Underwent open J-tube placement 1/28 - Osmolite 1.5 at 10 ml/hr initiated; pt developed vomiting after feedings were started and anti-emetics were not effective. 1/29 - Feeds were re-attempted overnight.  1/30 - NGT placed to LWIS; 250 ml output; Osmolite 1.5 resumed at 20 ml/hr 1/31 - NGT in place to LWIS; 1000 ml output; Osmolite 1.5 at 20 ml/hr 2/1 - NGT in place to LWIS; 950 ml output; pt pulled out NGT later in evening;  Osmolite 1.5 at 20 ml/hr 2/2 - NGT replaced to LWIS; 300 ml output; Osmolite 1.5 at 20 ml/hr 2/3 - NGT in place to LWIS; 100 ml/hr output per RN; Osmolite 1.5 on hold 2/2 pt to surgery; J-tube revision in OR with small bowel resection 2/4 - NGT in place to [facility]; per surgery do not use J-tube yet.  Potassium, magnesium and phos all WNL. Prealbumin is low at 11.7  Height: Ht Readings from Last 1 Encounters:  06/29/13 _0  (1.6 m)    Weight: Wt Readings from Last 1 Encounters:  07/03/13 169 lb 12.1 oz (77 kg)  Admit wt 152 lb; net +18 liters since admit  BMI:  Body mass index is 30.08 kg/(m^2). Obese Class I  Estimated Nutritional Needs: Kcal: 1600 - 1800 Protein: 75 - 90 g Fluid: 1.8 - 2 liters  Skin: abdomen incision  Diet Order: NPO   EDUCATION NEEDS: -No education needs identified at this time   Intake/Output Summary (Last 24 hours) at 07/03/13 1052 Last data filed at 07/03/13 1018  Gross per 24 hour  Intake 2441.67 ml  Output    300 ml  Net 2141.67 ml    Last BM: 2/2  Labs:   Recent Labs Lab 06/29/13 0500 06/29/13 0600 06/30/13 0500 07/01/13 0620 07/02/13 0350 07/03/13 0355  NA 144  --  145 143 145 143  K 3.4*  --  3.4* 3.9 3.8 4.5  CL 113*  --  110 106 107 107  CO2 20  --  _1 BUN <3*  --  <  3* <3* <3* 5*  CREATININE 0.49*  --  0.51 0.52 0.56 0.62  CALCIUM 8.1*  --  8.1* 8.0* 8.0* 7.7*  MG  --   --  1.6  --   --   --   PHOS  --  1.9*  --  3.5  --   --   GLUCOSE 90  --  99 90 86 104*    CBG (last 3)   Recent Labs  07/01/13 0727 07/02/13 0835 07/03/13 0745  GLUCAP 94 78 99   Prealbumin  Date/Time Value Range Status  06/24/2013  4:30 PM 11.7* 17.0 - 34.0 mg/dL Final     Performed at [facility]    Scheduled Meds: . calcium gluconate IVPB  4 g Intravenous Once  . chlorhexidine  15 mL Mouth Rinse BID  . heparin  1,000 Units Intracatheter Once  . levETIRAcetam  1,000 mg Intravenous Q12H  . metoprolol  5 mg Intravenous  Q6H  . mirtazapine  30 mg Oral QHS  . pantoprazole (PROTONIX) IV  40 mg Intravenous Q12H  . risperiDONE  0.5 mg Oral BID  . sodium chloride  10 mL Intravenous Q12H  . valproate sodium  500 mg Intravenous Q8H    Continuous Infusions: . citrate dextrose    . dextrose 5 % and 0.45 % NaCl with KCl 20 mEq/L 125 mL/hr at 07/03/13 0804    Inda Coke MS, RD, LDN Pager: [phone] After-hours pager: [phone]

## 2013-07-04 LAB — BASIC METABOLIC PANEL
BUN: 5 mg/dL — ABNORMAL LOW (ref 6–23)
CO2: 25 mEq/L (ref 19–32)
Calcium: 7.8 mg/dL — ABNORMAL LOW (ref 8.4–10.5)
Chloride: 107 mEq/L (ref 96–112)
Creatinine, Ser: 0.62 mg/dL (ref 0.50–1.10)
GFR calc non Af Amer: >90 mL/min (ref >90)
Glucose, Bld: 93 mg/dL (ref 70–99)
POTASSIUM: 4.1 meq/L (ref 3.7–5.3)
Sodium: 142 mEq/L (ref 137–147)

## 2013-07-04 LAB — HEPATIC FUNCTION PANEL
ALT: 7 U/L (ref 0–35)
AST: 12 U/L (ref 0–37)
Albumin: 2.1 g/dL — ABNORMAL LOW (ref 3.5–5.2)
Alkaline Phosphatase: 38 U/L — ABNORMAL LOW (ref 39–117)
BILIRUBIN TOTAL: 0.4 mg/dL (ref 0.3–1.2)
Bilirubin, Direct: <0.2 mg/dL (ref 0.0–0.3)
Total Protein: 4.1 g/dL — ABNORMAL LOW (ref 6.0–8.3)

## 2013-07-04 LAB — GLUCOSE, CAPILLARY
GLUCOSE-CAPILLARY: 124 mg/dL — AB (ref 70–99)
GLUCOSE-CAPILLARY: 88 mg/dL (ref 70–99)

## 2013-07-04 LAB — CBC
HCT: 25.9 % — ABNORMAL LOW (ref 36.0–46.0)
HEMOGLOBIN: 8.5 g/dL — AB (ref 12.0–15.0)
MCH: 31.7 pg (ref 26.0–34.0)
MCHC: 32.8 g/dL (ref 30.0–36.0)
MCV: 96.6 fL (ref 78.0–100.0)
PLATELETS: 83 10*3/uL — AB (ref 150–400)
RBC: 2.68 MIL/uL — AB (ref 3.87–5.11)
RDW: 18.1 % — ABNORMAL HIGH (ref 11.5–15.5)
WBC: 11 10*3/uL — ABNORMAL HIGH (ref 4.0–10.5)

## 2013-07-04 LAB — IRON AND TIBC
Iron: <10 ug/dL — ABNORMAL LOW (ref 42–135)
UIBC: 49 ug/dL — ABNORMAL LOW (ref 125–400)

## 2013-07-04 LAB — FERRITIN: Ferritin: 374 ng/mL — ABNORMAL HIGH (ref 10–291)

## 2013-07-04 LAB — MAGNESIUM: Magnesium: 1.7 mg/dL (ref 1.5–2.5)

## 2013-07-04 MED ORDER — SODIUM CHLORIDE 0.9 % IV BOLUS (SEPSIS)
1000.0000 mL | Freq: Once | INTRAVENOUS | Status: AC
  Administered 2013-07-04: 1000 mL via INTRAVENOUS

## 2013-07-04 MED ORDER — OSMOLITE 1.2 CAL PO LIQD
1000.0000 mL | ORAL | Status: DC
  Administered 2013-07-04: 1000 mL
  Filled 2013-07-04 (×3): qty 1000

## 2013-07-04 NOTE — Progress Notes (Signed)
Utilization review completed.  

## 2013-07-04 NOTE — Progress Notes (Signed)
Pts bp dropped back down into the 90's systolic after 1L NS bolus. Last reading 90/53. MD notifed. Will continue to monitor.

## 2013-07-04 NOTE — Progress Notes (Signed)
agree

## 2013-07-04 NOTE — Progress Notes (Signed)
NG tube removed per MD order. Pt tolerated procedure well, will continue to monitor.  

## 2013-07-04 NOTE — Progress Notes (Signed)
NUTRITION FOLLOW-UP  DOCUMENTATION CODES Per approved criteria  -Not Applicable   INTERVENTION: When ready to advance feedings, recommend advancement of Osmolite 1.2 by 10 ml q 12 hours to goal rate of 60 ml/hr. Goal rate will provide: 1728 kcal, 80 grams protein, 1181 ml free water. *Pt would benefit from slow advancement and close monitoring of electrolytes given refeeding syndrome risk.* Monitor magnesium, potassium, and phosphorus daily throughout advancement of nutrition support, MD to replete as needed, as pt is at risk for refeeding syndrome given prolonged poor oral intake.  RD to continue to follow nutrition care plan.  NUTRITION DIAGNOSIS: Inadequate oral intake related to AMS as evidenced by limited oral intake.  Ongoing.  Goal: Intake to meet >90% of estimated nutrition needs. Not met yet.  Monitor:  weight trends, lab trends, I/O's, TF tolerance and adequacy  ASSESSMENT: PMHx significant for catatonia, HTN, anxiety, gastric bypass. From SNF.  Recent admission from 12/13 - 12/28 with toxic metabolic encephalopathy, hospital course was complicated with pyelonephritis and C. Difficile colitis. Pt was scheduled for PEG on 1/20, was found to be agitated and confused and was sent to ED.   Neuro saw pt on 1/21 and recommended plasma exchanges every other day as well as suppressive steroids to reduce her seizure foci.  Pt developed frequent amount of large stooling from vagina on 1/25 and was sent to SDU. Remains in SDU at this time.  1/27 - Underwent open J-tube placement 1/28 - Osmolite 1.5 at 10 ml/hr initiated; pt developed vomiting after feedings were started and anti-emetics were not effective. 1/29 - Feeds were re-attempted overnight.  1/30 - NGT placed to LWIS; 250 ml output; Osmolite 1.5 resumed at 20 ml/hr 1/31 - NGT in place to LWIS; 1000 ml output; Osmolite 1.5 at 20 ml/hr 2/1 - NGT in place to LWIS; 950 ml output; pt pulled out NGT later in evening; Osmolite 1.5 at  20 ml/hr 2/2 - NGT replaced to LWIS; 300 ml output; Osmolite 1.5 at 20 ml/hr 2/3 - NGT in place to LWIS; 100 ml/hr output per RN; Osmolite 1.5 on hold 2/2 pt to surgery; J-tube revision in OR with small bowel resection 2/4 - NGT in place to [facility]; per surgery do not use J-tube yet 2/5 - NGT d/c'd; trickle TF of Osmolite 1.2 at 20 ml/hr initiated per surgery  RN reports that pt is tolerating TF well at this time. She notes that surgery would like for patient to stay at 20 ml/hr for at least 24 hours.  Potassium, magnesium and phos all WNL. Prealbumin is low at 11.7  Height: Ht Readings from Last 1 Encounters:  06/29/13 _0  (1.6 m)    Weight: Wt Readings from Last 1 Encounters:  07/04/13 174 lb 6.1 oz (79.1 kg)  Admit wt 152 lb; net +18 liters since admit  BMI:  Body mass index is 30.9 kg/(m^2). Obese Class I  Estimated Nutritional Needs: Kcal: 1600 - 1800 Protein: 75 - 90 g Fluid: 1.8 - 2 liters  Skin: abdomen incision  Diet Order: NPO   EDUCATION NEEDS: -No education needs identified at this time   Intake/Output Summary (Last 24 hours) at 07/04/13 1219 Last data filed at 07/04/13 1000  Gross per 24 hour  Intake   2805 ml  Output   1475 ml  Net   1330 ml    Last BM: 2/2  Labs:   Recent Labs Lab 06/29/13 0500 06/29/13 0600 06/30/13 0500 07/01/13 9798 07/02/13 0350 07/03/13 0355 07/04/13 0440  NA 144  --  145 143 145 143 142  K 3.4*  --  3.4* 3.9 3.8 4.5 4.1  CL 113*  --  110 106 107 107 107  CO2 20  --  _0 BUN <3*  --  <3* <3* <3* 5* 5*  CREATININE 0.49*  --  0.51 0.52 0.56 0.62 0.62  CALCIUM 8.1*  --  8.1* 8.0* 8.0* 7.7* 7.8*  MG  --   --  1.6  --   --   --  1.7  PHOS  --  1.9*  --  3.5  --   --   --   GLUCOSE 90  --  99 90 86 104* 93    CBG (last 3)   Recent Labs  07/03/13 0745 07/03/13 2215 07/04/13 0728  GLUCAP 99 124* 88   Prealbumin  Date/Time Value Range Status  06/24/2013  4:30 PM 11.7* 17.0 - 34.0 mg/dL Final      Performed at [facility]    Scheduled Meds: . calcium gluconate IVPB  4 g Intravenous Once  . chlorhexidine  15 mL Mouth Rinse BID  . heparin  1,000 Units Intracatheter Once  . levETIRAcetam  1,000 mg Intravenous Q12H  . metoprolol  5 mg Intravenous Q6H  . mirtazapine  30 mg Oral QHS  . pantoprazole (PROTONIX) IV  40 mg Intravenous Q12H  . risperiDONE  0.5 mg Oral BID  . sodium chloride  10 mL Intravenous Q12H  . valproate sodium  500 mg Intravenous Q8H    Continuous Infusions: . citrate dextrose    . dextrose 5 % and 0.45 % NaCl with KCl 20 mEq/L 125 mL/hr at 07/03/13 1831  . feeding supplement (OSMOLITE 1.2 CAL) 1,000 mL (07/04/13 1121)    Inda Coke MS, RD, LDN Pager: [phone] After-hours pager: [phone]

## 2013-07-04 NOTE — Progress Notes (Signed)
Patient ID: Joan [name], female   DOB: 1960/10/21, 53 y.o.   MRN: [id number] 2 Days Post-Op  Subjective: Pt's mental status is still very depressed.  Mumbles answers.  Objective: Vital signs in last 24 hours: Temp:  [97.9 F (36.6 C)-99.2 F (37.3 C)] 98.5 F (36.9 C) (02/05 0742) Pulse Rate:  [78-132] 132 (02/05 0742) Resp:  [8-21] 13 (02/05 0742) BP: (93-109)/(39-81) 93/62 mmHg (02/05 0742) SpO2:  [97 %-100 %] 99 % (02/05 0742) Weight:  [174 lb 6.1 oz (79.1 kg)] 174 lb 6.1 oz (79.1 kg) (02/05 0500) Last BM Date: 07/01/13  Intake/Output from previous day: 02/04 0701 - 02/05 0700 In: 3481.7 [I.V.:3096.7; IV Piggyback:385] Out: 1700 [Urine:1450; Emesis/NG output:250] Intake/Output this shift: Total I/O In: 250 [I.V.:250] Out: -   PE: Abd: soft, appropriately tender, incision covered with dressing.  J tube in place Heart: tachy, but regular  Lab Results:   Recent Labs  07/03/13 0355 07/04/13 0440  WBC 14.6* 11.0*  HGB 9.9* 8.5*  HCT 30.4* 25.9*  PLT 108* 83*   BMET  Recent Labs  07/03/13 0355 07/04/13 0440  NA 143 142  K 4.5 4.1  CL 107 107  CO2 23 25  GLUCOSE 104* 93  BUN 5* 5*  CREATININE 0.62 0.62  CALCIUM 7.7* 7.8*   PT/INR No results found for this basename: LABPROT, INR,  in the last 72 hours CMP     Component Value Date/Time   NA 142 07/04/2013 0440   K 4.1 07/04/2013 0440   CL 107 07/04/2013 0440   CO2 25 07/04/2013 0440   GLUCOSE 93 07/04/2013 0440   BUN 5* 07/04/2013 0440   CREATININE 0.62 07/04/2013 0440   CALCIUM 7.8* 07/04/2013 0440   PROT 4.6* 06/27/2013 1640   ALBUMIN 3.1* 06/27/2013 1640   AST 12 06/27/2013 1640   ALT 9 06/27/2013 1640   ALKPHOS 16* 06/27/2013 1640   BILITOT 0.7 06/27/2013 1640   GFRNONAA >90 07/04/2013 0440   GFRAA >90 07/04/2013 0440   Lipase     Component Value Date/Time   LIPASE 9* 06/30/2013 1300       Studies/Results: Dg Chest Port 1 View  07/03/2013   CLINICAL DATA:  Tubes and lines in good position, detailed above.   EXAM: PORTABLE CHEST - 1 VIEW  COMPARISON:  Chest CT 06/21/2013  FINDINGS: There is nasogastric tube which terminates near the pylorus. Right IJ catheter, tip in the upper right atrium. Right upper extremity PICC, tip at the upper cavoatrial junction.  Oral contrast noted within the gastric fundus and small bowel.  Haziness of the right base, with volume loss. Node definitive consolidation.  No cardiomegaly.  IMPRESSION: 1. Negative for pneumonia. 2. Haziness of the right lower chest, likely pleural fluid or atelectasis. 3. Tubes and lines in good position, detailed above.   Electronically Signed   By: Tiburcio Pea M.D.   On: 07/03/2013 03:35    Anti-infectives: Anti-infectives   Start     Dose/Rate Route Frequency Ordered Stop   07/02/13 1330  ciprofloxacin (CIPRO) IVPB 400 mg     400 mg 200 mL/hr over 60 Minutes Intravenous To Surgery 07/02/13 1321 07/02/13 1326   06/23/13 1400  piperacillin-tazobactam (ZOSYN) IVPB 3.375 g  Status:  Discontinued     3.375 g 12.5 mL/hr over 240 Minutes Intravenous 3 times per day 06/23/13 1336 06/26/13 1441   06/20/13 1200  vancomycin (VANCOCIN) IVPB 1000 mg/200 mL premix     1,000 mg 200 mL/hr over 60  Minutes Intravenous  Once 06/20/13 1146 06/20/13 1433       Assessment/Plan  1.  POD 2, s/p J-tube revision  Plan: 1. Patient remains with AMS and tachycardia.  She has remained AF in last 24 hrs 2. Will start trickle TFs through J-tube today at 20cc/hr.  They will remain here until we advance them.  We will dc her NGT as she has minimal output.  LOS: 15 days    Jackey Housey E 07/04/2013, 9:18 AM Pager: [phone]

## 2013-07-04 NOTE — Progress Notes (Signed)
[facility] PROGRESS NOTE  Joan [name] ZOX:[id number] DOB: 1961/05/16 DOA: 06/19/2013 PCP: [name], MD  HPI/Subjective: 53 yr old female presents with a CC of Altered mental status. Pt has a PMH of peripheral neuropathy, seizures, catatonia, anxiety, HTN, and fibromyalgia. Pt was previously admitted on 12/13 thru 12/28 with toxic metabolic encephalopathy.  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  EEG showed no recurrent seizures. A 24 hour EEG  showed an intermittent delta pattern felt to represent Frontal Intermittent Rhythmic Delta Activity. Pt was started on two antiepileptics.  This admission, she was again found altered and seemed "comatose" . Since d/c in December, pt has had persistent confusion and is confined to a wheelchair at [facility]. She is currently confused and experiencing visual hallucinations.She is s/p J tube placement and followed with intractable nausea, vomiting. Surgery following, . ON 2/3 night pt developed fever of 101.4, became tachycardic and hypotensive. CXR, done , UA ordered and blood cultures pending. Her work up so far negative for infection.   H&H remains stable at 8.5 , and she was found to have mild leukocytosis.  Subjective: Patient is sleeping comfortably, . Not in any distress, denies any pain or sob on asking, coughing a little,. Not bringing up any sputum.  Had fver overnight, blood cultures done and pending. UA is pending.    Assessment/Plan:  Anemia Unclear etiology, s/p 2 unit s of prbc transfusion and started her on protonix . Her stool for occult blood negative in the last 2 weeks. Anemia panel will be ordered.  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  -Pt received diatech catheter placement and plasmapheresis on 06/20/13.   -Plan is for plasmapheresis every other day and  Solumedrol 500 mg bid x 3 days. D/C on 1/24. -MRI of the brain showed no acute abnormality.  Detailed results listed below. -Ammonia level is normal - CT chest and transvaginal ultrasound showed no abnormalities. -[facility] paraneoplastic panel reported to be negative, MRI of the C-spine is negative for acute findings - Continue with Ativan prn. Pending:   serum NMDA.   Tachycardia - Patient continues to have sinus tachycardia probably secondary to hypotension from volume depletion and fever.  - appears to be sinus tachy, will get 12 lead EKG .  -Continue with IV metoprolol 5 mg q  6 hrs. - echocardiogram ordered for further evaluation.   Hx of Convulsions/Seizures  -Pt has a PMH of seizures and catatonia.   -MRI of the brain showed thalamic signal abnormality during previous admission.  No longer present on current MRI. -Pt currently taking Keppra and Depakene.  Increased Depakene to 500mg  tid -Valproic acid lvl 35.1 was low.  Repeat Depakote level is 76.6  Hypokalemia - Repleted  Nausea&Vomiting Started after tube feeds was initiated Tuber feeds held  as per surgery recommendation. Continue zofran, phenergan prn. NG Tube now inserted She underwent revision of the j tube per surgery and small bowel resection on 2/3. Awaiting recommendations from surgery to restart tube feeds.    Hx of Anxiety  -Continue pt on Remeron -Patient appears to have had a psychiatric history  -Current illness is bringing out psychiatric symptoms. -Psychiatry consulted as psychotropic medications may help with her current symptoms. -Psych prescribed Risperdal .5 mg bid Ativan prn for anxiety.  Hypertension  -Pt has a PMH of HTN  -Likely secondary  to anxiety  Continue metoprolol and prn hydralazine.   Malnutrition with history of Gastric Bypass -Per SNF patient has had no PO intake for over 1 week prior to admission. -SNF sent her to hospital for J tube placement. -Gen Surg consulted, J-tube placed  and underwent revision of j tube withexploratory laparotomy on 2/3 and small bowel resection.   Phosphate deficiency - K phos IV  X 1  Will check phosphorus in am.  Recent hx of c-diff (05/18/13)  -Pt negative for C-diff at this admit  Thrombocytopenia -Discontinue heparin, seems to be chronic up and down, could be secondary to folate deficiency.  Enterovaginal fistula -Reported prior to that this patient had some stools in her vagina. -Discussed with radiology, no evidence of rectovaginal fistula and the previous imaging, (patient had indicated pelvic scan done previously). - Consulted GI, and the did not feel that patient will benefit from colonoscopy for the diagnosis of fistula. - Called and discussed with Gyn on-call, and they recommend to follow the patient as outpatient. No further workup required in the hospital.  Fever:  Unclear etiology resolved. CXR negative for pneumonia, right lower lobe atelectasis. UA negative for infection, skin intact. Blood cultures done and pending. Venous dopplers ordered and pending.  [name]   DVT Prophylaxis:  SCD';s  Code Status: Full Code Family Communication: Son was contacted to explain procedures and status on 07/03/12. Disposition Plan: D/C to SNF when medically appropriate.   Consultants:  Surgery  Neuro  Procedures:  Plasmapheresis  PICC Line Placement  Antibiotics:  None  Family communication None at bedside.   Objective: Filed Vitals:   07/04/13 0400 07/04/13 0500 07/04/13 0635 07/04/13 0742  BP: 107/74  109/74   Pulse: 128  132   Temp:    98.5 F (36.9 C)  TempSrc:    Oral  Resp: 13  17   Height:      Weight:  79.1 kg (174 lb 6.1 oz)    SpO2: 99%  99%     Intake/Output Summary (Last 24 hours) at 07/04/13 0839 Last data filed at 07/04/13 0600  Gross per 24 hour  Intake 3016.67 ml  Output   1650 ml  Net 1366.67 ml   Filed Weights   07/02/13 0500 07/03/13 0500 07/04/13 0500  Weight: 76.1 kg (167 lb 12.3 oz)  77 kg (169 lb 12.1 oz) 79.1 kg (174 lb 6.1 oz)    Exam: Physical Exam: wakes up on verbal cues,  no distress.  Eyes: No signs of jaundice, pale conjunctiva,  EOMI Nose: NG tube in place Throat: Oropharynx nonerythematous, no exudate appreciated.  Neck: supple,No deformities, masses, or tenderness noted. Chest wall tender to palpation. Lungs: Normal respiratory effort. Scattered rhonchi bilaterally.  Heart: tachycardic regular. S1 and S2 normal  Abdomen: BS normoactive. Soft, Nondistended, non-tender.  Extremities: 1+ pedal edema, extremities tender to touch, no erythema.       Data Reviewed: Basic Metabolic Panel:  Recent Labs Lab 06/29/13 0500 06/29/13 0600 06/30/13 0500 07/01/13 0620 07/02/13 0350 07/03/13 0355 07/04/13 0440  NA 144  --  145 143 145 143 142  K 3.4*  --  3.4* 3.9 3.8 4.5 4.1  CL 113*  --  110 106 107 107 107  CO2 20  --  23 24 27 23 25   GLUCOSE 90  --  99 90 86 104* 93  BUN <3*  --  <3* <3* <3* 5* 5*  CREATININE 0.49*  --  0.51 0.52 0.56 0.62 0.62  CALCIUM 8.1*  --  8.1* 8.0* 8.0* 7.7* 7.8*  MG  --   --  1.6  --   --   --  1.7  PHOS  --  1.9*  --  3.5  --   --   --    Liver Function Tests:  Recent Labs Lab 06/27/13 1640  AST 12  ALT 9  ALKPHOS 16*  BILITOT 0.7  PROT 4.6*  ALBUMIN 3.1*   No results found for this basename: AMMONIA,  in the last 168 hours CBC:  Recent Labs Lab 06/30/13 0500 07/01/13 0620 07/02/13 0350 07/03/13 0355 07/04/13 0440  WBC 5.9 7.1 7.3 14.6* 11.0*  HGB 9.0* 9.4* 9.2* 9.9* 8.5*  HCT 26.2* 28.5* 27.9* 30.4* 25.9*  MCV 92.6 94.4 96.2 96.8 96.6  PLT 85* 96* 99* 108* 83*   CBG:  Recent Labs Lab 07/02/13 0835 07/03/13 0202 07/03/13 0745 07/03/13 2215 07/04/13 0728  GLUCAP 78 137* 99 124* 88    Recent Results (from the past 240 hour(s))  CULTURE, BLOOD (ROUTINE X 2)     Status: None   Collection Time    07/03/13  6:40 AM      Result Value Range Status   Specimen Description BLOOD LEFT HAND    Final   Special Requests BOTTLES DRAWN AEROBIC ONLY 2CC   Final   Culture  Setup Time     Final   Value: 07/03/2013 13:19     Performed at [facility]   Culture     Final   Value:        BLOOD CULTURE RECEIVED NO GROWTH TO DATE CULTURE WILL BE HELD FOR 5 DAYS BEFORE ISSUING A FINAL NEGATIVE REPORT     Performed at [facility]   Report Status PENDING   Incomplete  CULTURE, BLOOD (ROUTINE X 2)     Status: None   Collection Time    07/03/13  6:55 AM      Result Value Range Status   Specimen Description BLOOD LEFT ARM   Final   Special Requests BOTTLES DRAWN AEROBIC ONLY 1CC   Final   Culture  Setup Time     Final   Value: 07/03/2013 13:19     Performed at [facility]   Culture     Final   Value:        BLOOD CULTURE RECEIVED NO GROWTH TO DATE CULTURE WILL BE HELD FOR 5 DAYS BEFORE ISSUING A FINAL NEGATIVE REPORT     Performed at [facility]   Report Status PENDING   Incomplete     Studies: Dg Chest Port 1 View  07/03/2013   CLINICAL DATA:  Tubes and lines in good position, detailed above.  EXAM: PORTABLE CHEST - 1 VIEW  COMPARISON:  Chest CT 06/21/2013  FINDINGS: There is nasogastric tube which terminates near the pylorus. Right IJ catheter, tip in the upper right atrium. Right upper extremity PICC, tip at the upper cavoatrial junction.  Oral contrast noted within the gastric fundus and small bowel.  Haziness of the right base, with volume loss. Node definitive consolidation.  No cardiomegaly.  IMPRESSION: 1. Negative for pneumonia. 2. Haziness of the right lower chest, likely pleural fluid or atelectasis. 3. Tubes and lines in good position, detailed above.   Electronically Signed   By: Tiburcio Pea M.D.   On: 07/03/2013 03:35    Scheduled Meds: . calcium gluconate IVPB  4 g Intravenous Once  . chlorhexidine  15 mL Mouth Rinse BID  . heparin  1,000 Units Intracatheter Once  . levETIRAcetam  1,000 mg Intravenous Q12H  . metoprolol  5 mg Intravenous  Q6H  . mirtazapine  30 mg Oral QHS  . pantoprazole (PROTONIX) IV  40 mg Intravenous Q12H  . risperiDONE  0.5 mg Oral BID  . sodium chloride  10 mL Intravenous Q12H  . valproate sodium  500 mg Intravenous Q8H   Continuous Infusions: . citrate dextrose    . dextrose 5 % and 0.45 % NaCl with KCl 20 mEq/L 125 mL/hr at 07/03/13 1831    Principal Problem:   Acute encephalopathy Active Problems:   Convulsions/seizures   Toxic metabolic encephalopathy   Psychosis   Nausea with vomiting   Malfunction of jejunostomy tube   S/P small bowel resection    [facility] EastKULA,Cederic Mozley [facility] Pager [phone]. If 7PM-7AM, please contact night-coverage at [email or web address], password [email or web address] 07/04/2013, 8:39 AM  LOS: 15 days

## 2013-07-05 DIAGNOSIS — R609 Edema, unspecified: Secondary | ICD-10-CM

## 2013-07-05 DIAGNOSIS — I517 Cardiomegaly: Secondary | ICD-10-CM

## 2013-07-05 LAB — CBC
HEMATOCRIT: 25 % — AB (ref 36.0–46.0)
HEMOGLOBIN: 8.3 g/dL — AB (ref 12.0–15.0)
MCH: 31.9 pg (ref 26.0–34.0)
MCHC: 33.2 g/dL (ref 30.0–36.0)
MCV: 96.2 fL (ref 78.0–100.0)
Platelets: 89 10*3/uL — ABNORMAL LOW (ref 150–400)
RBC: 2.6 MIL/uL — ABNORMAL LOW (ref 3.87–5.11)
RDW: 17.9 % — ABNORMAL HIGH (ref 11.5–15.5)
WBC: 7.8 10*3/uL (ref 4.0–10.5)

## 2013-07-05 LAB — GLUCOSE, CAPILLARY
GLUCOSE-CAPILLARY: 83 mg/dL (ref 70–99)
GLUCOSE-CAPILLARY: 109 mg/dL — AB (ref 70–99)

## 2013-07-05 LAB — BASIC METABOLIC PANEL
BUN: 4 mg/dL — ABNORMAL LOW (ref 6–23)
CO2: 25 mEq/L (ref 19–32)
Calcium: 7.6 mg/dL — ABNORMAL LOW (ref 8.4–10.5)
Chloride: 108 mEq/L (ref 96–112)
Creatinine, Ser: 0.6 mg/dL (ref 0.50–1.10)
Glucose, Bld: 104 mg/dL — ABNORMAL HIGH (ref 70–99)
POTASSIUM: 4.4 meq/L (ref 3.7–5.3)
Sodium: 144 mEq/L (ref 137–147)

## 2013-07-05 LAB — PHOSPHORUS: PHOSPHORUS: 2.6 mg/dL (ref 2.3–4.6)

## 2013-07-05 LAB — MAGNESIUM: MAGNESIUM: 1.7 mg/dL (ref 1.5–2.5)

## 2013-07-05 MED ORDER — OSMOLITE 1.2 CAL PO LIQD
1000.0000 mL | ORAL | Status: DC
  Administered 2013-07-05: 1000 mL
  Filled 2013-07-05 (×5): qty 1000

## 2013-07-05 NOTE — Progress Notes (Signed)
Echocardiogram 2D Echocardiogram has been performed.  Tania Perrott 07/05/2013, 11:21 AM

## 2013-07-05 NOTE — Clinical Social Work Note (Signed)
Per RNCM patient's [facility]. CSW called and explained to son that if he wants patient to go to a different facility that he needs to let CSW know today so that facility can begin insurance authorization for SNF. Patient's son states that he wants patient to return to [facility] at discharge,   Joan [name], LCSWA, LCASA, [phone]

## 2013-07-05 NOTE — Progress Notes (Signed)
*  PRELIMINARY RESULTS* Vascular Ultrasound Lower extremity venous duplex has been completed.  Preliminary findings: no evidence of DVT.   Farrel DemarkJill Eunice, RDMS, RVT  07/05/2013, 8:48 AM

## 2013-07-05 NOTE — Progress Notes (Signed)
3 Days Post-Op  Subjective: Unchanged, doesn't really converse with me  Objective: Vital signs in last 24 hours: Temp:  [97.8 F (36.6 C)-99.3 F (37.4 C)] 98.7 F (37.1 C) (02/06 0733) Pulse Rate:  [113-135] 123 (02/06 0733) Resp:  [11-17] 17 (02/06 0733) BP: (75-127)/(51-89) 127/81 mmHg (02/06 0733) SpO2:  [99 %-100 %] 100 % (02/06 0733) Weight:  [173 lb 4.5 oz (78.6 kg)] 173 lb 4.5 oz (78.6 kg) (02/06 0400) Last BM Date: 07/01/13  Intake/Output from previous day: 02/05 0701 - 02/06 0700 In: 1820 [I.V.:1500; NG/GT:180; IV Piggyback:50] Out: 2450 [Urine:2300; Emesis/NG output:150] Intake/Output this shift: Total I/O In: 30 [Other:30] Out: 500 [Urine:500]  GI: soft approp tender wound clean without drainage or infection j tube site clean  Lab Results:   Recent Labs  07/04/13 0440 07/05/13 0530  WBC 11.0* 7.8  HGB 8.5* 8.3*  HCT 25.9* 25.0*  PLT 83* 89*   BMET  Recent Labs  07/04/13 0440 07/05/13 0530  NA 142 144  K 4.1 4.4  CL 107 108  CO2 25 25  GLUCOSE 93 104*  BUN 5* 4*  CREATININE 0.62 0.60  CALCIUM 7.8* 7.6*    Anti-infectives: Anti-infectives   Start     Dose/Rate Route Frequency Ordered Stop   07/02/13 1330  ciprofloxacin (CIPRO) IVPB 400 mg     400 mg 200 mL/hr over 60 Minutes Intravenous To Surgery 07/02/13 1321 07/02/13 1326   06/23/13 1400  piperacillin-tazobactam (ZOSYN) IVPB 3.375 g  Status:  Discontinued     3.375 g 12.5 mL/hr over 240 Minutes Intravenous 3 times per day 06/23/13 1336 06/26/13 1441   06/20/13 1200  vancomycin (VANCOCIN) IVPB 1000 mg/200 mL premix     1,000 mg 200 mL/hr over 60 Minutes Intravenous  Once 06/20/13 1146 06/20/13 1433      Assessment/Plan: 1. POD 3, s/p J-tube revision and sbr  Plan:  1. Patient remains with AMS and tachycardia. She has remained AF in last 24 hrs and appears overall ok 2. Advance tube feeds as tolerated   Lashanna Angelo 07/05/2013

## 2013-07-05 NOTE — Consult Note (Signed)
[facility] Gastroenterology Consultation Note  Referring Provider:  Dr. Kathlen Mody ([facility]) Primary Care Physician:  [name], MD Primary Gastroenterologist:  Dr. Gibson Ramp [facility])  Reason for Consultation:  anemia  HPI: Joan [name] is a 53 y.o. female admitted for altered mental status, has been here a couple weeks.  We were asked to see for anemia. Patient has history of gastric bypass and ? Partial gastrectomy.  She had J-tube placed couple weeks ago, complicated by bowel obstruction requiring repeat operative intervention and replacement of J-tube.  Patient is alert, but not able to answer questions very well.  She specifically denies abdominal pain or blood in stool.  Report of guaiac-negative stool couple weeks ago.  Review of her labs from the past couple weeks shows hemoglobin in the 8-11 range.  Iron panel consistent with anemia of chronic disease, with low iron and saturations and elevated ferritin.  She has seen Dr. Gibson Ramp within the past one year, and her consult and office notes were reviewed from the "Care Everywhere" tab.  Apparently, she has had upper endoscopy (including normal small bowel biopsies) and colonoscopy within the past couple years, for management of diarrhea and incontinence symptoms, and Dr. Jacinto Reap has done host of other anorectal manometry and other studies for her incontinence.   Past Medical History  Diagnosis Date  . Peripheral neuropathy   . Fibromyalgia   . Hypertension   . Anxiety   . Catatonia   . Seizures     Past Surgical History  Procedure Laterality Date  . Gastric bypass    . Abdominal hysterectomy    . Jejunostomy N/A 06/25/2013    Procedure:  OPEN JEJUNOSTOMY FEEDING TUBE ;  Surgeon: Cherylynn Ridges, MD;  Location: [facility] OR;  Service: General;  Laterality: N/A;    Prior to Admission medications   Medication Sig Start Date End Date Taking? Authorizing Provider  acetaminophen (TYLENOL) 325 MG tablet Take 650 mg by  mouth every 4 (four) hours as needed for mild pain or fever.   Yes Historical Provider, MD  atenolol (TENORMIN) 25 MG tablet Take 12.5 mg by mouth daily.    Yes Historical Provider, MD  chlorhexidine (PERIDEX) 0.12 % solution Use as directed 15 mLs in the mouth or throat 2 (two) times daily.   Yes Historical Provider, MD  feeding supplement, RESOURCE BREEZE, (RESOURCE BREEZE) LIQD Take 1 Container by mouth 2 (two) times daily between meals.   Yes Historical Provider, MD  folic acid (FOLVITE) 1 MG tablet Take 1 mg by mouth daily.   Yes Historical Provider, MD  levETIRAcetam (KEPPRA) 1000 MG tablet Take 1,000 mg by mouth 2 (two) times daily.   Yes Historical Provider, MD  loperamide (IMODIUM) 2 MG capsule Take 2 mg by mouth as needed for diarrhea or loose stools.   Yes Historical Provider, MD  megestrol (MEGACE) 400 MG/10ML suspension Take 400 mg by mouth daily.   Yes Historical Provider, MD  methocarbamol (ROBAXIN) 500 MG tablet Take 500 mg by mouth every 6 (six) hours as needed for muscle spasms.   Yes Historical Provider, MD  mirtazapine (REMERON SOL-TAB) 30 MG disintegrating tablet Take 30 mg by mouth at bedtime.   Yes Historical Provider, MD  Multiple Vitamins-Minerals (ELDERTONIC PO) Take 15 mLs by mouth 2 (two) times daily.   Yes Historical Provider, MD  Nutritional Supplements (NUTRITIONAL SHAKE) LIQD Take 1 Can by mouth 3 (three) times daily before meals. Mighty shakes.   Yes Historical Provider, MD  thiamine (VITAMIN B-1) 100 MG tablet Take 100 mg by mouth daily.   Yes Historical Provider, MD  valproic acid (DEPAKENE) 250 MG capsule Take 250-500 mg by mouth 3 (three) times daily. Take 500mg  in the morning, 250mg  in the afternoon, and 500mg  at bedtime.   Yes Historical Provider, MD    Current Facility-Administered Medications  Medication Dose Route Frequency Provider Last Rate Last Dose  . acetaminophen (TYLENOL) suppository 650 mg  650 mg Rectal Q4H PRN Roma Kayser Schorr, NP   650 mg at  07/03/13 0351  . acetaminophen (TYLENOL) tablet 650 mg  650 mg Oral Q4H PRN [name], MD   650 mg at 07/01/13 2205  . calcium gluconate 4 g in sodium chloride 0.9 % 250 mL IVPB  4 g Intravenous Once [name], MD      . chlorhexidine (PERIDEX) 0.12 % solution 15 mL  15 mL Mouth Rinse BID Meredeth Ide, MD   15 mL at 07/05/13 0730  . citrate dextrose (ACD-A anticoagulant) solution 500 mL  500 mL Intravenous Continuous [name], MD      . dextrose 5 % and 0.45 % NaCl with KCl 20 mEq/L infusion   Intravenous Continuous Emelia Loron, MD 125 mL/hr at 07/05/13 1200    . feeding supplement (OSMOLITE 1.2 CAL) liquid 1,000 mL  1,000 mL Per Tube Continuous Haynes Bast, RD      . heparin injection 1,000 Units  1,000 Units Intracatheter Once [name], MD      . hydrALAZINE (APRESOLINE) injection 10 mg  10 mg Intravenous Q6H PRN Stephani Police, PA-C   10 mg at 06/23/13 0636  . HYDROmorphone (DILAUDID) injection 0.5-1 mg  0.5-1 mg Intravenous Q2H PRN Cherylynn Ridges, MD   1 mg at 07/05/13 0730  . levETIRAcetam (KEPPRA) 1,000 mg in sodium chloride 0.9 % 100 mL IVPB  1,000 mg Intravenous Q12H Meredeth Ide, MD   1,000 mg at 07/05/13 1010  . LORazepam (ATIVAN) injection 2 mg  2 mg Intravenous Q4H PRN Meredeth Ide, MD   2 mg at 07/02/13 0111  . metoprolol (LOPRESSOR) injection 5 mg  5 mg Intravenous Q6H Meredeth Ide, MD   5 mg at 07/05/13 1138  . mirtazapine (REMERON SOL-TAB) disintegrating tablet 30 mg  30 mg Oral QHS Rhetta Mura, MD   30 mg at 07/04/13 2200  . ondansetron (ZOFRAN) injection 4 mg  4 mg Intravenous Q3H PRN Caleen Essex [name], MD   4 mg at 06/28/13 1439  . pantoprazole (PROTONIX) injection 40 mg  40 mg Intravenous Q12H Meredeth Ide, MD   40 mg at 07/05/13 1010  . promethazine (PHENERGAN) injection 12.5 mg  12.5 mg Intravenous Q6H PRN Caleen Essex [name], MD   12.5 mg at 06/27/13 1505  . risperiDONE (RISPERDAL M-TABS) disintegrating tablet 0.5 mg  0.5  mg Oral BID Stephani Police, PA-C   0.5 mg at 07/05/13 1010  . sodium chloride 0.9 % injection 10 mL  10 mL Intravenous Q12H Meredeth Ide, MD   10 mL at 07/05/13 1010  . sodium chloride 0.9 % injection 10-40 mL  10-40 mL Intracatheter PRN Rhetta Mura, MD   10 mL at 06/23/13 0426  . valproate (DEPACON) 500 mg in dextrose 5 % 50 mL IVPB  500 mg Intravenous Q8H Meredeth Ide, MD   500 mg at 07/05/13 0603    Allergies as of 06/19/2013 - Review Complete 06/19/2013  Allergen Reaction  Noted  . Morphine and related Other (See Comments) 06/01/2011  . Paroxetine hcl Other (See Comments) 06/01/2011  . Penicillins Other (See Comments) 06/01/2011  . Sertraline hcl Other (See Comments) 06/01/2011    Family History  Problem Relation Age of Onset  . Hypertension Mother   . Diabetes Father     History   Social History  . Marital Status: Single    Spouse Name: N/A    Number of Children: N/A  . Years of Education: N/A   Occupational History  . Not on file.   Social History Main Topics  . Smoking status: Never Smoker   . Smokeless tobacco: Not on file  . Alcohol Use: No  . Drug Use: Not on file  . Sexual Activity: Not on file   Other Topics Concern  . Not on file   Social History Narrative  . No narrative on file    Review of Systems: Unable to obtain due to patient's altered mental status  Physical Exam: Vital signs in last 24 hours: Temp:  [97.8 F (36.6 C)-99 F (37.2 C)] 98.5 F (36.9 C) (02/06 1154) Pulse Rate:  [109-125] 109 (02/06 1154) Resp:  [11-17] 14 (02/06 1154) BP: (102-127)/(76-89) 107/83 mmHg (02/06 1154) SpO2:  [99 %-100 %] 100 % (02/06 1154) Weight:  [78.6 kg (173 lb 4.5 oz)] 78.6 kg (173 lb 4.5 oz) (02/06 0400) Last BM Date: 07/01/13 General:   Alert but incoherent, answers questions inappropriately Head:  Normocephalic and atraumatic. Eyes:  Sclera clear, no icterus.   Conjunctiva pale Ears:  Normal auditory acuity. Nose:  No deformity, discharge,   or lesions. Mouth:  No deformity or lesions.  Oropharynx dry and pale-appearing Neck:  Supple; no masses or thyromegaly. Lungs:  Clear throughout to auscultation.   No wheezes, crackles, or rhonchi. No acute distress. Heart:  Regular rate and rhythm; no murmurs, clicks, rubs,  or gallops. Abdomen:  Soft, J-tube in place, old midline vertical incision; No masses, hepatosplenomegaly or hernias noted. Normal bowel sounds, without guarding, and without rebound.     Msk:  Symmetrical without gross deformities. Normal posture. Pulses:  Normal pulses noted. Extremities:  Without clubbing or edema. Neurologic:  Alert but disoriented Skin:  Intact without significant lesions or rashes. Psych:  Alert but incoherent   Lab Results:  Recent Labs  07/03/13 0355 07/04/13 0440 07/05/13 0530  WBC 14.6* 11.0* 7.8  HGB 9.9* 8.5* 8.3*  HCT 30.4* 25.9* 25.0*  PLT 108* 83* 89*   BMET  Recent Labs  07/03/13 0355 07/04/13 0440 07/05/13 0530  NA 143 142 144  K 4.5 4.1 4.4  CL 107 107 108  CO2 23 25 25   GLUCOSE 104* 93 104*  BUN 5* 5* 4*  CREATININE 0.62 0.62 0.60  CALCIUM 7.7* 7.8* 7.6*   LFT  Recent Labs  07/04/13 1224  PROT 4.1*  ALBUMIN 2.1*  AST 12  ALT 7  ALKPHOS 38*  BILITOT 0.4  BILIDIR <0.2  IBILI NOT CALCULATED   PT/INR No results found for this basename: LABPROT, INR,  in the last 72 hours  Studies/Results: No results found.  Impression:  1.  Anemia.  Appears to be ongoing problem upon my review of labs over the past six weeks at least.  No overt GI blood loss.  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  2.  Protein calorie malnutrition.  Recent open jejunostomy tube placement, complicated by bowel obstruction requiring re-operative intervention. 3.   Altered mental status. 4.  Multiple other medical problems.  Plan:  1.  There is no evidence of active GI bleeding, and her Hgb has been fluctuating in the 8-11 range for the past six weeks.  In absence of overt GI bleeding, I don't see any need for endoscopic evaluation at the present time. 2.  Would continue PPI, now and upon eventual hospital discharge. 3.  Oral and/or parenteral iron may be useful, though her iron stores don't seem too depleted based on her elevated ferritin (although it could be falsely elevated, given its role as an acute phase reactant). 4.  Serial CBCs, with blood transfusion as-needed. 5.  Upon resolution of all of her more pressing medical matters, patient should return to her primary gastroenterologist (Dr. Gibson Ramp, [facility]) for management of her anemia and pelvic floor dysfunction troubles. 6.  Will sign-off; please call with any further questions; thank you for the consult.   LOS: 16 days   Joan [name] M  07/05/2013, 1:19 PM

## 2013-07-05 NOTE — Progress Notes (Signed)
[facility] PROGRESS NOTE  Candida PeelingLora Eshleman XBJ:[id number] DOB: 08/07/1960 DOA: 06/19/2013 PCP: Kimber RelicGREEN, ARTHUR G, MD  HPI/Subjective: 53 yr old female presents with a CC of Altered mental status. Pt has a PMH of peripheral neuropathy, seizures, catatonia, anxiety, HTN, and fibromyalgia. Pt was previously admitted on 12/13 thru 12/28 with toxic metabolic encephalopathy.  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  EEG showed no recurrent seizures. A 24 hour EEG  showed an intermittent delta pattern felt to represent Frontal Intermittent Rhythmic Delta Activity. Pt was started on two antiepileptics.  This admission, she was again found altered and seemed "comatose" . Since d/c in December, pt has had persistent confusion and [facility]. She is currently confused and experiencing visual hallucinations.She is s/p J tube placement and followed with intractable nausea, vomiting. Surgery following, . ON 2/3 night pt developed fever of 101.4, became tachycardic and hypotensive. CXR, done , UA ordered and blood cultures pending. Her work up so far negative for infection.   H&H remains stable at 8.5 , and she was found to have mild leukocytosis.  Subjective: Patient is sleeping comfortably, . Not in any distress, denies any pain or sob on asking, coughing a little,. Not bringing up any sputum.  Reports mild abdominal pain. She is oriented to place .    Assessment/Plan:  Anemia Unclear etiology, s/p 2 unit s of prbc transfusion and started her on protonix . Her stool for occult blood negative in the last 2 weeks. Anemia panel showed severe iron deficiency . Will need IV iron.  She might require EGD once more stable, GI consulted.   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  -Pt received diatech catheter placement and plasmapheresis on 06/20/13.   -Plan is for  plasmapheresis every other day and Solumedrol 500 mg bid x 3 days. D/C on 1/24. -MRI of the brain showed no acute abnormality.  Detailed results listed below. -Ammonia level is normal - CT chest and transvaginal ultrasound showed no abnormalities. -[facility] paraneoplastic panel reported to be negative, MRI of the C-spine is negative for acute findings - Continue with Ativan prn. Pending:   serum NMDA.   Tachycardia - Patient continues to have sinus tachycardia probably secondary to hypotension from volume depletion and fever.  - appears to be sinus tachy, will get 12 lead EKG .  -Continue with IV metoprolol 5 mg q  6 hrs. - echocardiogram ordered for further evaluation and is pending.   Hx of Convulsions/Seizures  -Pt has a PMH of seizures and catatonia.   -MRI of the brain showed thalamic signal abnormality during previous admission.  No longer present on current MRI. -Pt currently taking Keppra and Depakene.  Increased Depakene to 500mg  tid -Valproic acid lvl 35.1 was low.  Repeat Depakote level is 76.6  Hypokalemia - Repleted  Nausea&Vomiting Started after tube feeds was initiated, which has resolved.  Continue zofran, phenergan prn She underwent revision of the j tube per surgery and small bowel resection on 2/3. Ng tube was taken out and she was started on very low rate tube feeds.   Hx of Anxiety  -Continue pt on Remeron -Patient appears to have had a psychiatric history  -Current illness is bringing out psychiatric symptoms. -Psychiatry consulted as psychotropic medications may help with her current symptoms. -Psych prescribed Risperdal .5 mg bid Ativan prn for anxiety.  Hypertension  -  Pt has a PMH of HTN  -Likely secondary to anxiety  Continue metoprolol and prn hydralazine.   Malnutrition with history of Gastric Bypass -Per SNF patient has had no PO intake for over 1 week prior to admission. -SNF sent her to hospital for J tube placement. -Gen Surg consulted, J-tube  placed and underwent revision of j tube withexploratory laparotomy on 2/3 and small bowel resection.   Phosphate deficiency - K phos IV  X 1  Will check phosphorus in am.  Recent hx of c-diff (05/18/13)  -Pt negative for C-diff at this admit  Thrombocytopenia -Discontinue heparin, seems to be chronic up and down, could be secondary to folate deficiency.  Enterovaginal fistula -Reported prior to that this patient had some stools in her vagina. -Discussed with radiology, no evidence of rectovaginal fistula and the previous imaging, (patient had indicated pelvic scan done previously). - Consulted GI, and the did not feel that patient will benefit from colonoscopy for the diagnosis of fistula. - Called and discussed with Gyn on-call, and they recommend to follow the patient as outpatient. No further workup required in the hospital.  Fever:  Unclear etiology resolved. CXR negative for pneumonia, right lower lobe atelectasis. UA negative for infection, skin intact. Blood cultures done and pending. Venous dopplers negative for DVT. [name]   DVT Prophylaxis:  SCD';s  Code Status: Full Code Family Communication: Son was contacted to explain procedures and status on 07/03/12. Disposition Plan: D/C to SNF when medically appropriate.   Consultants:  Surgery  Neuro  Procedures:  Plasmapheresis  PICC Line Placement  Antibiotics:  None  Family communication None at bedside.   Objective: Filed Vitals:   07/04/13 2100 07/05/13 0000 07/05/13 0400 07/05/13 0733  BP: 119/76 102/77 111/79 127/81  Pulse: 125 113 125 123  Temp:  97.8 F (36.6 C) 98.9 F (37.2 C) 98.7 F (37.1 C)  TempSrc:  Oral Oral Oral  Resp: 14 16 14 17   Height:      Weight:   78.6 kg (173 lb 4.5 oz)   SpO2: 99% 100% 100% 100%    Intake/Output Summary (Last 24 hours) at 07/05/13 0922 Last data filed at 07/05/13 0733  Gross per 24 hour  Intake   1600 ml  Output   2700 ml  Net  -1100 ml   Filed Weights    07/03/13 0500 07/04/13 0500 07/05/13 0400  Weight: 77 kg (169 lb 12.1 oz) 79.1 kg (174 lb 6.1 oz) 78.6 kg (173 lb 4.5 oz)    Exam: Physical Exam: wakes up on verbal cues,  no distress.  Eyes: No signs of jaundice, pale conjunctiva,  EOMI Nose: NG tube in place Throat: Oropharynx nonerythematous, no exudate appreciated.  Neck: supple,No deformities, masses, or tenderness noted. Chest wall tender to palpation. Lungs: Normal respiratory effort. Scattered rhonchi bilaterally.  Heart: tachycardic regular. S1 and S2 normal  Abdomen: BS normoactive. Soft, Nondistended, non-tender.  Extremities: 1+ pedal edema, extremities tender to touch, no erythema.       Data Reviewed: Basic Metabolic Panel:  Recent Labs Lab 06/29/13 0500 06/29/13 0600 06/30/13 0500 07/01/13 0620 07/02/13 0350 07/03/13 0355 07/04/13 0440 07/05/13 0530  NA 144  --  145 143 145 143 142 144  K 3.4*  --  3.4* 3.9 3.8 4.5 4.1 4.4  CL 113*  --  110 106 107 107 107 108  CO2 20  --  23 24 27 23 25 25   GLUCOSE 90  --  99 90 86 104* 93  104*  BUN <3*  --  <3* <3* <3* 5* 5* 4*  CREATININE 0.49*  --  0.51 0.52 0.56 0.62 0.62 0.60  CALCIUM 8.1*  --  8.1* 8.0* 8.0* 7.7* 7.8* 7.6*  MG  --   --  1.6  --   --   --  1.7 1.7  PHOS  --  1.9*  --  3.5  --   --   --  2.6   Liver Function Tests:  Recent Labs Lab 07/04/13 1224  AST 12  ALT 7  ALKPHOS 38*  BILITOT 0.4  PROT 4.1*  ALBUMIN 2.1*   No results found for this basename: AMMONIA,  in the last 168 hours CBC:  Recent Labs Lab 07/01/13 0620 07/02/13 0350 07/03/13 0355 07/04/13 0440 07/05/13 0530  WBC 7.1 7.3 14.6* 11.0* 7.8  HGB 9.4* 9.2* 9.9* 8.5* 8.3*  HCT 28.5* 27.9* 30.4* 25.9* 25.0*  MCV 94.4 96.2 96.8 96.6 96.2  PLT 96* 99* 108* 83* 89*   CBG:  Recent Labs Lab 07/03/13 0202 07/03/13 0745 07/03/13 2215 07/04/13 0728 07/05/13 0726  GLUCAP 137* 99 124* 88 109*    Recent Results (from the past 240 hour(s))  CULTURE, BLOOD (ROUTINE X 2)      Status: None   Collection Time    07/03/13  6:40 AM      Result Value Range Status   Specimen Description BLOOD LEFT HAND   Final   Special Requests BOTTLES DRAWN AEROBIC ONLY 2CC   Final   Culture  Setup Time     Final   Value: 07/03/2013 13:19     Performed at [facility]   Culture     Final   Value:        BLOOD CULTURE RECEIVED NO GROWTH TO DATE CULTURE WILL BE HELD FOR 5 DAYS BEFORE ISSUING A FINAL NEGATIVE REPORT     Performed at [facility]   Report Status PENDING   Incomplete  CULTURE, BLOOD (ROUTINE X 2)     Status: None   Collection Time    07/03/13  6:55 AM      Result Value Range Status   Specimen Description BLOOD LEFT ARM   Final   Special Requests BOTTLES DRAWN AEROBIC ONLY 1CC   Final   Culture  Setup Time     Final   Value: 07/03/2013 13:19     Performed at [facility]   Culture     Final   Value:        BLOOD CULTURE RECEIVED NO GROWTH TO DATE CULTURE WILL BE HELD FOR 5 DAYS BEFORE ISSUING A FINAL NEGATIVE REPORT     Performed at [facility]   Report Status PENDING   Incomplete     Studies: No results found.  Scheduled Meds: . calcium gluconate IVPB  4 g Intravenous Once  . chlorhexidine  15 mL Mouth Rinse BID  . heparin  1,000 Units Intracatheter Once  . levETIRAcetam  1,000 mg Intravenous Q12H  . metoprolol  5 mg Intravenous Q6H  . mirtazapine  30 mg Oral QHS  . pantoprazole (PROTONIX) IV  40 mg Intravenous Q12H  . risperiDONE  0.5 mg Oral BID  . sodium chloride  10 mL Intravenous Q12H  . valproate sodium  500 mg Intravenous Q8H   Continuous Infusions: . citrate dextrose    . dextrose 5 % and 0.45 % NaCl with KCl 20 mEq/L 125 mL/hr at 07/05/13 0700  .  feeding supplement (OSMOLITE 1.2 CAL) 1,000 mL (07/05/13 0700)    Principal Problem:   Acute encephalopathy Active Problems:   Convulsions/seizures   Toxic metabolic encephalopathy   Psychosis   Nausea with vomiting   Malfunction of jejunostomy tube   S/P  small bowel resection    Loney Domingo [facility] Pager [phone]. If 7PM-7AM, please contact night-coverage at [email or web address], password [facility] 07/05/2013, 9:22 AM  LOS: 16 days

## 2013-07-05 NOTE — Progress Notes (Signed)
NUTRITION FOLLOW-UP  DOCUMENTATION CODES Per approved criteria  -Not Applicable   INTERVENTION: Advance Osmolite 1.2 by 10 ml q 12 hours to goal rate of 60 ml/hr. Goal rate will provide: 1728 kcal, 80 grams protein, 1181 ml free water. *Pt would benefit from slow advancement and close monitoring of electrolytes given refeeding syndrome risk.* Monitor magnesium, potassium, and phosphorus daily throughout advancement of nutrition support, MD to replete as needed, as pt is at risk for refeeding syndrome given prolonged poor oral intake.  RD to continue to follow nutrition care plan.  NUTRITION DIAGNOSIS: Inadequate oral intake related to AMS as evidenced by limited oral intake.  Ongoing.  Goal: Intake to meet >90% of estimated nutrition needs. Not met yet.  Monitor:  weight trends, lab trends, I/O's, TF tolerance and adequacy  ASSESSMENT: PMHx significant for catatonia, HTN, anxiety, gastric bypass. From SNF.  Recent admission from 12/13 - 12/28 with toxic metabolic encephalopathy, hospital course was complicated with pyelonephritis and C. Difficile colitis. Pt was scheduled for PEG on 1/20, was found to be agitated and confused and was sent to ED.   Neuro saw pt on 1/21 and recommended plasma exchanges every other day as well as suppressive steroids to reduce her seizure foci.  Pt developed frequent amount of large stooling from vagina on 1/25 and was sent to SDU. Remains in SDU at this time.  1/27 - Underwent open J-tube placement 1/28 - Osmolite 1.5 at 10 ml/hr initiated; pt developed vomiting after feedings were started and anti-emetics were not effective. 1/29 - Feeds were re-attempted overnight.  1/30 - NGT placed to LWIS; 250 ml output; Osmolite 1.5 resumed at 20 ml/hr 1/31 - NGT in place to LWIS; 1000 ml output; Osmolite 1.5 at 20 ml/hr 2/1 - NGT in place to LWIS; 950 ml output; pt pulled out NGT later in evening; Osmolite 1.5 at 20 ml/hr 2/2 - NGT replaced to LWIS; 300 ml  output; Osmolite 1.5 at 20 ml/hr 2/3 - NGT in place to LWIS; 100 ml/hr output per RN; Osmolite 1.5 on hold 2/2 pt to surgery; J-tube revision in OR with small bowel resection 2/4 - NGT in place to [facility]; per surgery do not use J-tube yet 2/5 - NGT d/c'd; trickle TF of Osmolite 1.2 at 20 ml/hr initiated per surgery 2/6 - TF of Osmolite 1.2 at 20 ml/hr - RD with orders to increase feedings; RN reports tolerating well  RN reports that pt is tolerating TF well at this time - receiving Osmolite 1.2 at 20 ml/hr.   Potassium, magnesium and phos all WNL. Prealbumin is low at 11.7  Height: Ht Readings from Last 1 Encounters:  06/29/13 5' 3"  (1.6 m)    Weight: Wt Readings from Last 1 Encounters:  07/05/13 173 lb 4.5 oz (78.6 kg)  Admit wt 152 lb; net +18 liters since admit  BMI:  Body mass index is 30.7 kg/(m^2). Obese Class I  Estimated Nutritional Needs: Kcal: 1600 - 1800 Protein: 75 - 90 g Fluid: 1.8 - 2 liters  Skin: abdomen incision  Diet Order: NPO   EDUCATION NEEDS: -No education needs identified at this time   Intake/Output Summary (Last 24 hours) at 07/05/13 1119 Last data filed at 07/05/13 1100  Gross per 24 hour  Intake   1350 ml  Output   2701 ml  Net  -1351 ml    Last BM: 2/2  Labs:   Recent Labs Lab 06/29/13 0600 06/30/13 0500 07/01/13 0620  07/03/13 0355 07/04/13 0440 07/05/13  0530  NA  --  145 143  < > 143 142 144  K  --  3.4* 3.9  < > 4.5 4.1 4.4  CL  --  110 106  < > 107 107 108  CO2  --  23 24  < > 23 25 25   BUN  --  <3* <3*  < > 5* 5* 4*  CREATININE  --  0.51 0.52  < > 0.62 0.62 0.60  CALCIUM  --  8.1* 8.0*  < > 7.7* 7.8* 7.6*  MG  --  1.6  --   --   --  1.7 1.7  PHOS 1.9*  --  3.5  --   --   --  2.6  GLUCOSE  --  99 90  < > 104* 93 104*  < > = values in this interval not displayed.  CBG (last 3)   Recent Labs  07/04/13 0728 07/04/13 2033 07/05/13 0726  GLUCAP 88 83 109*   Prealbumin  Date/Time Value Range Status  06/24/2013   4:30 PM 11.7* 17.0 - 34.0 mg/dL Final     Performed at [facility]    Scheduled Meds: . calcium gluconate IVPB  4 g Intravenous Once  . chlorhexidine  15 mL Mouth Rinse BID  . heparin  1,000 Units Intracatheter Once  . levETIRAcetam  1,000 mg Intravenous Q12H  . metoprolol  5 mg Intravenous Q6H  . mirtazapine  30 mg Oral QHS  . pantoprazole (PROTONIX) IV  40 mg Intravenous Q12H  . risperiDONE  0.5 mg Oral BID  . sodium chloride  10 mL Intravenous Q12H  . valproate sodium  500 mg Intravenous Q8H    Continuous Infusions: . citrate dextrose    . dextrose 5 % and 0.45 % NaCl with KCl 20 mEq/L 125 mL/hr at 07/05/13 0700  . feeding supplement (OSMOLITE 1.2 CAL) 1,000 mL (07/05/13 0700)    Inda Coke MS, RD, LDN Pager: [phone] After-hours pager: [phone]

## 2013-07-06 LAB — BASIC METABOLIC PANEL
BUN: <3 mg/dL — ABNORMAL LOW (ref 6–23)
CALCIUM: 7.4 mg/dL — AB (ref 8.4–10.5)
CO2: 26 meq/L (ref 19–32)
Chloride: 109 mEq/L (ref 96–112)
Creatinine, Ser: 0.6 mg/dL (ref 0.50–1.10)
GFR calc Af Amer: >90 mL/min (ref >90)
Glucose, Bld: 100 mg/dL — ABNORMAL HIGH (ref 70–99)
POTASSIUM: 4.2 meq/L (ref 3.7–5.3)
SODIUM: 144 meq/L (ref 137–147)

## 2013-07-06 LAB — CBC
HCT: 24.3 % — ABNORMAL LOW (ref 36.0–46.0)
HEMOGLOBIN: 8 g/dL — AB (ref 12.0–15.0)
MCH: 32 pg (ref 26.0–34.0)
MCHC: 32.9 g/dL (ref 30.0–36.0)
MCV: 97.2 fL (ref 78.0–100.0)
Platelets: 81 10*3/uL — ABNORMAL LOW (ref 150–400)
RBC: 2.5 MIL/uL — AB (ref 3.87–5.11)
RDW: 17.5 % — ABNORMAL HIGH (ref 11.5–15.5)
WBC: 5.5 10*3/uL (ref 4.0–10.5)

## 2013-07-06 LAB — OCCULT BLOOD X 1 CARD TO LAB, STOOL: Fecal Occult Bld: NEGATIVE

## 2013-07-06 LAB — GLUCOSE, CAPILLARY: Glucose-Capillary: 100 mg/dL — ABNORMAL HIGH (ref 70–99)

## 2013-07-06 LAB — CLOSTRIDIUM DIFFICILE BY PCR: CDIFFPCR: NEGATIVE

## 2013-07-06 MED ORDER — JEVITY 1.2 CAL PO LIQD
1000.0000 mL | ORAL | Status: DC
  Administered 2013-07-06: 40 mL/h
  Administered 2013-07-07: 60 mL
  Filled 2013-07-06 (×5): qty 1000

## 2013-07-06 MED ORDER — PAREGORIC 2 MG/5ML PO TINC: 5.0000 mL | Freq: Four times a day (QID) | ORAL | Status: DC | PRN

## 2013-07-06 NOTE — Progress Notes (Signed)
NUTRITION FOLLOW-UP  INTERVENTION: Change TF to Jevity 1.2 at 40 ml/hr and advance q 12 hours to goal rate of 60 ml/hr. Goal rate will provide: 1728 kcal, 80 grams protein, 1181 ml free water. *Pt would benefit from slow advancement and close monitoring of electrolytes given refeeding syndrome risk.* Monitor magnesium, potassium, and phosphorus daily throughout advancement of nutrition support, MD to replete as needed, as pt is at risk for refeeding syndrome given prolonged poor oral intake.  RD to continue to follow nutrition care plan.  NUTRITION DIAGNOSIS: Inadequate oral intake related to AMS as evidenced by limited oral intake.  Ongoing.  Goal: Intake to meet >90% of estimated nutrition needs. Not met yet.  Monitor:  weight trends, lab trends, I/O's, TF tolerance and adequacy  ASSESSMENT: PMHx significant for catatonia, HTN, anxiety, gastric bypass. From SNF.  Recent admission from 12/13 - 12/28 with toxic metabolic encephalopathy, hospital course was complicated with pyelonephritis and C. Difficile colitis. Pt was scheduled for PEG on 1/20, was found to be agitated and confused and was sent to ED.   Neuro saw pt on 1/21 and recommended plasma exchanges every other day as well as suppressive steroids to reduce her seizure foci.  Pt developed frequent amount of large stooling from vagina on 1/25 and was sent to SDU. Remains in SDU at this time.  1/27 - Underwent open J-tube placement 1/28 - Osmolite 1.5 at 10 ml/hr initiated; pt developed vomiting after feedings were started and anti-emetics were not effective. 1/29 - Feeds were re-attempted overnight.  1/30 - NGT placed to LWIS; 250 ml output; Osmolite 1.5 resumed at 20 ml/hr 1/31 - NGT in place to LWIS; 1000 ml output; Osmolite 1.5 at 20 ml/hr 2/1 - NGT in place to LWIS; 950 ml output; pt pulled out NGT later in evening; Osmolite 1.5 at 20 ml/hr 2/2 - NGT replaced to LWIS; 300 ml output; Osmolite 1.5 at 20 ml/hr 2/3 - NGT in place  to LWIS; 100 ml/hr output per RN; Osmolite 1.5 on hold 2/2 pt to surgery; J-tube revision in OR with small bowel resection 2/4 - NGT in place to [facility]; per surgery do not use J-tube yet 2/5 - NGT d/c'd; trickle TF of Osmolite 1.2 at 20 ml/hr initiated per surgery 2/6 - TF of Osmolite 1.2 at 20 ml/hr - RD with orders to increase feedings; RN reports tolerating well 2/7- RD re-consulted due to diarrhea. Change TF from Osmolite to Jevity.   RN reports that pt is tolerating TF well at this time except for diarrhea. Rectal tube placed at 10:30 AM per RN with significant output in 2 hours.  Receiving Osmolite 1.2 at 40 ml/hr. Potassium, magnesium, and phosphorus are WNL.    Height: Ht Readings from Last 1 Encounters:  07/06/13 _0  (1.6 m)    Weight: Wt Readings from Last 1 Encounters:  07/06/13 165 lb 5.5 oz (75 kg)  Admit wt 152 lb; net +18 liters since admit  BMI:  Body mass index is 29.3 kg/(m^2).   Estimated Nutritional Needs: Kcal: 1600 - 1800 Protein: 75 - 90 g Fluid: 1.8 - 2 liters  Skin: abdomen incision  Diet Order: NPO   EDUCATION NEEDS: -No education needs identified at this time   Intake/Output Summary (Last 24 hours) at 07/06/13 1224 Last data filed at 07/06/13 1100  Gross per 24 hour  Intake   2005 ml  Output   2878 ml  Net   -873 ml    Last BM: 2/7  Labs:   Recent Labs Lab 06/30/13 0500 07/01/13 0620  07/04/13 0440 07/05/13 0530 07/06/13 0310  NA 145 143  < > 142 144 144  K 3.4* 3.9  < > 4.1 4.4 4.2  CL 110 106  < > 107 108 109  CO2 23 24  < > _0 BUN <3* <3*  < > 5* 4* <3*  CREATININE 0.51 0.52  < > 0.62 0.60 0.60  CALCIUM 8.1* 8.0*  < > 7.8* 7.6* 7.4*  MG 1.6  --   --  1.7 1.7  --   PHOS  --  3.5  --   --  2.6  --   GLUCOSE 99 90  < > 93 104* 100*  < > = values in this interval not displayed.  CBG (last 3)   Recent Labs  07/04/13 2033 07/05/13 0726 07/06/13 0745  GLUCAP 83 109* 100*   Prealbumin  Date/Time Value Range  Status  06/24/2013  4:30 PM 11.7* 17.0 - 34.0 mg/dL Final     Performed at [facility]    Scheduled Meds: . calcium gluconate IVPB  4 g Intravenous Once  . chlorhexidine  15 mL Mouth Rinse BID  . heparin  1,000 Units Intracatheter Once  . levETIRAcetam  1,000 mg Intravenous Q12H  . metoprolol  5 mg Intravenous Q6H  . mirtazapine  30 mg Oral QHS  . pantoprazole (PROTONIX) IV  40 mg Intravenous Q12H  . risperiDONE  0.5 mg Oral BID  . sodium chloride  10 mL Intravenous Q12H  . valproate sodium  500 mg Intravenous Q8H    Continuous Infusions: . citrate dextrose    . dextrose 5 % and 0.45 % NaCl with KCl 20 mEq/L 125 mL/hr at 07/06/13 0840  . feeding supplement (OSMOLITE 1.2 CAL) 1,000 mL (07/05/13 1423)    Pryor Ochoa RD, LDN Inpatient Clinical Dietitian Pager: [phone] After Hours Pager: [phone]

## 2013-07-06 NOTE — Progress Notes (Signed)
Will add paregoric to tube feeds

## 2013-07-06 NOTE — Progress Notes (Signed)
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 No further neurological intervention, neuro signed off on 2/3 -MRI of the brain showed no acute abnormality. Detailed results listed below.  -Ammonia level is normal  - CT chest and transvaginal ultrasound showed no abnormalities.  -[facility] paraneoplastic panel reported to be negative, MRI of the C-spine is negative for acute findings  - Continue with Ativan prn.     Anemia  Unclear etiology, s/p 2 unit s of prbc transfusion and started her on protonix . - No plans for endoscopy evaluation at the present time per gastroenterology  -  Anemia panel shows iron deficiency, has elevated ferritin, recommended to return to her primary gastroenterologist in [facility].  Tachycardia  - Patient continues to have sinus tachycardia probably secondary to hypotension from volume depletion and fever.  - appears to be sinus tachy, will get 12 lead EKG .  - 2-D echo showed EF of 55-60%, normal wall motion  Hx of Convulsions/Seizures  -Pt has a PMH of seizures and catatonia.  -MRI of the brain showed thalamic signal abnormality during previous admission. No longer present on current MRI.  -Pt currently taking Keppra and Depakene. Increased Depakene to 526m tid  -Valproic acid lvl 35.1 was low.  Nausea&Vomiting , diarrhea Started after tube feeds was initiated, which has resolved.  Continue zofran, phenergan prn  She underwent revision of the j tube per surgery and small bowel resection on 2/3. Ng tube was taken out and she was started on very low rate tube feeds.  - Check C. difficile  and GI pathogen panel, nutrition consult for changing the tube feeds, and axial  Hx of Anxiety  -Continue Remeron, Psych prescribed Risperdal .5 mg bid  Ativan prn for anxiety.   Hypertension  -Likely secondary to anxiety  Continue metoprolol and prn hydralazine.   Malnutrition with history of Gastric Bypass  -Per SNF patient has had no PO intake for over 1 week prior to admission.  -SNF sent her to hospital for J tube placement.  -Gen Surg consulted, J-tube placed and underwent revision of j tube withexploratory laparotomy on 2/3 and small bowel resection.    Diarrhea with Recent hx of c-diff (05/18/13)  -Check C. difficile, stool culture, GI pathogen panel  Thrombocytopenia  -Discontinue heparin, seems to be chronic up and down, could be secondary to folate deficiency.   Enterovaginal fistula  -Reported prior to that this patient had some stools in her vagina.  -Discussed with radiology, no evidence of rectovaginal fistula and the previous imaging, (patient had indicated pelvic scan done previously).  - Consulted GI, and the did not feel that patient will benefit from colonoscopy for the diagnosis of fistula.  - Dr [place]arleen Ham[place] and they recommend to follow the patient as outpatient. No further workup required in the hospital.   Fever:  Unclear etiology resolved. CXR negative for pneumonia, right lower lobe atelectasis. UA negative for infection, skin intact. Blood cultures done and pending. Venous dopplers negative for DVT.  .[name] DVT Prophylaxis: SCDs  Code Status: Full code  Family Communication:  Disposition:    Subjective: No complaints except Diarrhea  Objective:  Weight change: -3.6 kg (-7 lb 15 oz)  Intake/Output Summary (Last 24 hours) at 07/06/13 1109 Last data filed at 07/06/13 1000  Gross per 24 hour  Intake   2025 ml  Output   3053 ml  Net  -1028 ml   Blood pressure 119/74, pulse 117, temperature 98.3 F (36.8 C), temperature  source Oral, resp. rate 14, height _0  (1.6 m), weight 75 kg (165 lb 5.5 oz), SpO2 100.00%.  Physical Exam: General: Alert and awake,  not in any acute distress. CVS: S1-S2 clear, no murmur rubs or gallops Chest: clear to auscultation bilaterally, no wheezing, rales or rhonchi Abdomen: soft normal bowel sounds , J-tube+ Extremities: no cyanosis, clubbing or edema noted bilaterally   Lab Results: Basic Metabolic Panel:  Recent Labs Lab 07/05/13 0530 07/06/13 0310  NA 144 144  K 4.4 4.2  CL 108 109  CO2 25 26  GLUCOSE 104* 100*  BUN 4* <3*  CREATININE 0.60 0.60  CALCIUM 7.6* 7.4*  MG 1.7  --   PHOS 2.6  --    Liver Function Tests:  Recent Labs Lab 07/04/13 1224  AST 12  ALT 7  ALKPHOS 38*  BILITOT 0.4  PROT 4.1*  ALBUMIN 2.1*    Recent Labs Lab 06/30/13 1300  LIPASE 9*   No results found for this basename: AMMONIA,  in the last 168 hours CBC:  Recent Labs Lab 07/05/13 0530 07/06/13 0310  WBC 7.8 5.5  HGB 8.3* 8.0*  HCT 25.0* 24.3*  MCV 96.2 97.2  PLT 89* 81*   Cardiac Enzymes: No results found for this basename: CKTOTAL, CKMB, CKMBINDEX, TROPONINI,  in the last 168 hours BNP: No components found with this basename: POCBNP,  CBG:  Recent Labs Lab 07/03/13 2215 07/04/13 0728 07/04/13 2033 07/05/13 0726 07/06/13 0745  GLUCAP 124* 88 83 109* 100*     Micro Results: Recent Results (from the past 240 hour(s))  CULTURE, BLOOD (ROUTINE X 2)     Status: None   Collection Time    07/03/13  6:40 AM      Result Value Range Status   Specimen Description BLOOD LEFT HAND   Final   Special Requests BOTTLES DRAWN AEROBIC ONLY 2CC   Final   Culture  Setup Time     Final   Value: 07/03/2013 13:19     Performed at [facility]   Culture     Final   Value:        BLOOD CULTURE RECEIVED NO GROWTH TO DATE CULTURE WILL BE HELD FOR 5 DAYS BEFORE ISSUING A FINAL NEGATIVE REPORT     Performed at [facility]   Report Status PENDING    Incomplete  CULTURE, BLOOD (ROUTINE X 2)     Status: None   Collection Time    07/03/13  6:55 AM      Result Value Range Status   Specimen Description BLOOD LEFT ARM   Final   Special Requests BOTTLES DRAWN AEROBIC ONLY 1CC   Final   Culture  Setup Time     Final   Value: 07/03/2013 13:19     Performed at [facility]   Culture     Final   Value:        BLOOD CULTURE RECEIVED NO GROWTH TO DATE CULTURE WILL BE HELD FOR 5 DAYS BEFORE ISSUING A FINAL NEGATIVE REPORT     Performed at [facility]   Report Status PENDING   Incomplete  Studies/Results: Dg Abd 1 View  06/27/2013   CLINICAL DATA:  Nausea, abdominal pain, vomiting  EXAM: ABDOMEN - 1 VIEW  COMPARISON:  None.  FINDINGS: There is a surgical drain present in the mid abdomen. There are surgical stated in the right paramedian abdominal wall. There is a small amount of contrast scattered throughout the colon. There is no bowel dilatation to suggest obstruction. There is no evidence of pneumoperitoneum, portal venous gas or pneumatosis. There are no pathologic calcifications along the expected course of the ureters.The osseous structures are unremarkable.  IMPRESSION: Nonobstructive bowel gas pattern.   Electronically Signed   By: Kathreen Devoid   On: 06/27/2013 09:34   Dg Abd 1 View  06/17/2013   CLINICAL DATA:  NG tube placement.  EXAM: ABDOMEN - 1 VIEW  COMPARISON:  None.  FINDINGS: NG tube tip is in the peripyloric region in the right upper quadrant. Side port is in the mid to distal stomach.  Final image demonstrates injection of contrast filling the distal stomach and proximal duodenum.  IMPRESSION: NG tube tip in the peripyloric region.   Electronically Signed   By: Rolm Baptise M.D.   On: 06/17/2013 11:56   Ct Head Wo Contrast  06/13/2013   CLINICAL DATA:  Altered mental status. Failure to thrive. Seizures.  EXAM: CT HEAD WITHOUT CONTRAST  TECHNIQUE: Contiguous axial images were obtained from the base of the skull  through the vertex without intravenous contrast.  COMPARISON:  05/13/2013  FINDINGS: There is no evidence of intracranial hemorrhage, brain edema, or other signs of acute infarction. There is no evidence of intracranial mass lesion or mass effect. No abnormal extraaxial fluid collections are identified.  Mild diffuse cerebral atrophy is stable. No evidence hydrocephalus. No other intracranial abnormality identified. No skull fracture or other bone lesion identified.  IMPRESSION: No acute intracranial findings.  Stable mild cerebral atrophy.   Electronically Signed   By: Earle Gell M.D.   On: 06/13/2013 19:53   Ct Chest W Contrast  06/21/2013   CLINICAL DATA:  Altered mental status.  Evaluate for malignancy.  EXAM: CT CHEST WITH CONTRAST  TECHNIQUE: Multidetector CT imaging of the chest was performed during intravenous contrast administration.  CONTRAST:  68m OMNIPAQUE IOHEXOL 300 MG/ML  SOLN  COMPARISON:  Chest x-ray dated 05/11/2013  FINDINGS: Heart size and pulmonary vascularity are normal. There is no hilar or mediastinal adenopathy. The lungs are clear. No mass lesions, infiltrates, or effusions. Soft tissues of the chest are normal. Thyroid gland is normal. No acute osseous abnormality. Osteophytes fuse the thoracic spine from T6 through T10.  IMPRESSION: No evidence of malignancy or other significant abnormality of the chest.   Electronically Signed   By: JRozetta NunneryM.D.   On: 06/21/2013 17:13   Mr BJeri CosWXMContrast  06/20/2013   CLINICAL DATA:  Altered mental status. Possible autoimmune encephalitis.  EXAM: MRI HEAD WITHOUT AND WITH CONTRAST  TECHNIQUE: Multiplanar, multiecho pulse sequences of the brain and surrounding structures were obtained without and with intravenous contrast.  CONTRAST:  148mMULTIHANCE GADOBENATE DIMEGLUMINE 529 MG/ML IV SOLN  COMPARISON:  Head CT 06/13/2013 and brain MRI 05/16/2013  FINDINGS: Images are mildly to moderately degraded by motion artifact.  Incidental note is  again made of a partially empty sella. No definite residual abnormal T2 or diffusion weighted signal is identified in the thalami. Mild periventricular T2 hyperintensity is unchanged and nonspecific. No new areas of brain parenchymal signal abnormality are identified. There is  mild cerebral atrophy. There is no evidence of acute infarct. There is no evidence of mass, midline shift, or extra-axial fluid collection. Orbits are unremarkable. Major intracranial vascular flow voids are unremarkable. Visualized paranasal sinuses and mastoid air cells are clear. There is no abnormal enhancement.  IMPRESSION: Interval resolution of thalamic signal abnormality. No evidence of new intracranial abnormality.   Electronically Signed   By: Logan Bores   On: 06/20/2013 17:26   Mr Cervical Spine Wo Contrast  06/22/2013   CLINICAL DATA:  Right hemiparesis. Possible autoimmune encephalitis.  EXAM: MRI CERVICAL SPINE WITHOUT CONTRAST  TECHNIQUE: Multiplanar, multisequence MR imaging was performed. No intravenous contrast was administered.  COMPARISON:  No comparison cervical spine MR. brain MR 06/20/2013.  FINDINGS: On this motion grade examination, artifact extends through the cervical cord however, no definitive cervical cord signal abnormality is noted. Cervical medullary junction unremarkable. Intracranial structures as detailed on recent MR of the brain.  Visualized paravertebral structures unremarkable. Both vertebral arteries are patent.  C2-3:  Negative.  C3-4:  Minimal right foraminal narrowing.  C4-5:  Minimal right foraminal narrowing.  C5-6:  Negative.  C6-7:  Negative.  C7-T1:  Negative.  T1-2:  Negative.  T2-3: Minimal Schmorl's node deformity.  Minimal bulge.  T3-4: Minimal bulge.  T4-5: Minimal bulge.  IMPRESSION: No evidence of cervical disc herniation.  Minimal degenerative changes upper thoracic spine.  Please see above.   Electronically Signed   By: Chauncey Cruel M.D.   On: 06/22/2013 19:38   US Transvaginal  Non-ob  06/21/2013   CLINICAL DATA:  Altered mental status. Evaluation for possible paraneoplastic syndrome.  EXAM: TRANSABDOMINAL AND TRANSVAGINAL ULTRASOUND OF PELVIS  TECHNIQUE: Both transabdominal and transvaginal ultrasound examinations of the pelvis were performed. Transabdominal technique was performed for global imaging of the pelvis including uterus, ovaries, adnexal regions, and pelvic cul-de-sac. It was necessary to proceed with endovaginal exam following the transabdominal exam to visualize the ovaries.  COMPARISON:  None  FINDINGS: Uterus  Removed.  Right ovary  Not visualized.  Left ovary  Not visualized.  Other findings  No free fluid.  IMPRESSION: The ovaries could not be visualized on transvaginal or transabdominal pelvic ultrasound. However, review of the CT scan of the pelvis dated 05/17/2013 does demonstrate that both ovaries appear normal, measuring 23 mm on the right and 16 mm on the left.   Electronically Signed   By: Rozetta Nunnery M.D.   On: 06/21/2013 16:45   US Pelvis Complete  06/21/2013   CLINICAL DATA:  Altered mental status. Evaluation for possible paraneoplastic syndrome.  EXAM: TRANSABDOMINAL AND TRANSVAGINAL ULTRASOUND OF PELVIS  TECHNIQUE: Both transabdominal and transvaginal ultrasound examinations of the pelvis were performed. Transabdominal technique was performed for global imaging of the pelvis including uterus, ovaries, adnexal regions, and pelvic cul-de-sac. It was necessary to proceed with endovaginal exam following the transabdominal exam to visualize the ovaries.  COMPARISON:  None  FINDINGS: Uterus  Removed.  Right ovary  Not visualized.  Left ovary  Not visualized.  Other findings  No free fluid.  IMPRESSION: The ovaries could not be visualized on transvaginal or transabdominal pelvic ultrasound. However, review of the CT scan of the pelvis dated 05/17/2013 does demonstrate that both ovaries appear normal, measuring 23 mm on the right and 16 mm on the left.    Electronically Signed   By: Rozetta Nunnery M.D.   On: 06/21/2013 16:45   Ir Fluoro Guide Cv Line Right  06/20/2013   CLINICAL DATA:  Encephalitis and need for tunneled catheter for plasmapheresis.  EXAM: TUNNELED CENTRAL VENOUS CATHETER PLACEMENT WITH ULTRASOUND AND FLUOROSCOPIC GUIDANCE  MEDICATIONS: 1 g IV vancomycin. Vancomycin was given within two hours of incision. Vancomycin was given due to an antibiotic allergy.  ANESTHESIA/SEDATION: 2.0 mg IV Versed; 100 mcg IV Fentanyl.  Total Moderate Sedation Time  Twenty minutes.  FLUOROSCOPY TIME:  42 seconds.  PROCEDURE: The procedure, risks, benefits, and alternatives were explained to the patient. Questions regarding the procedure were encouraged and answered. The patient understands and consents to the procedure.  The right neck and chest were prepped with chlorhexidine in a sterile fashion, and a sterile drape was applied covering the operative field. Maximum barrier sterile technique with sterile gowns and gloves were used for the procedure. Local anesthesia was provided with 1% lidocaine.  Ultrasound was used to confirm patency of the right internal jugular vein. After creating a small venotomy incision, a 21 gauge needle was advanced into the right internal jugular vein under direct, real-time ultrasound guidance. Ultrasound image documentation was performed. After securing guidewire access, an 8 Fr dilator was placed. A J-wire was kinked to measure appropriate catheter length.  A Bard Equistream tunneled hemodialysis/pheresis catheter measuring 19 cm from tip to cuff was chosen for placement. This was tunneled in a retrograde fashion from the chest wall to the venotomy incision.  At the venotomy, serial dilatation was performed and a 16 Fr peel-away sheath was placed over a guidewire. The catheter was then placed through the sheath and the sheath removed. Final catheter positioning was confirmed and documented with a fluoroscopic spot image. The catheter was  aspirated, flushed with saline, and injected with appropriate volume heparin dwells.  The venotomy incision was closed with subcutaneous 3-0 Monocryl and subcuticular 4-0 Vicryl. Dermabond was applied to the incision. The catheter exit site was secured with 0-Prolene retention sutures.  COMPLICATIONS: None.  No pneumothorax.  FINDINGS: After catheter placement, the tips lie in the right atrium. The catheter aspirates normally and is ready for immediate use.  IMPRESSION: Placement of tunneled pheresis catheter via the right internal jugular vein. The catheter tips lie in the right atrium. The catheter is ready for immediate use.   Electronically Signed   By: Aletta Edouard M.D.   On: 06/20/2013 14:27   Ir Fluoro Guide Cv Line Right  06/18/2013   EXAM: ULTRASOUND AND FLUOROSCOPIC GUIDED PICC LINE INSERTION  MEDICATIONS: None.  TECHNIQUE: The procedure, risks, benefits, and alternatives were explained to the patient's son and informed written consent was obtained. A timeout was performed prior to the initiation of the procedure.  The right upper extremity was prepped with chlorhexidine in a sterile fashion, and a sterile drape was applied covering the operative field. Maximum barrier sterile technique with sterile gowns and gloves were used for the procedure. A timeout was performed prior to the initiation of the procedure. Local anesthesia was provided with 1% lidocaine.  Under direct ultrasound guidance, the right brachial vein was accessed with a micropuncture kit after the overlying soft tissues were anesthetized with 1% lidocaine. An ultrasound image was saved for documentation purposes. A guidewire was advanced to the level of the superior caval-atrial junction for measurement purposes and the PICC line was cut to length. A peel-away sheath was placed and a 36 cm, 5 [place], dual lumen was inserted to level of the superior caval-atrial junction. A post procedure spot fluoroscopic was obtained. The catheter  easily aspirated and flushed and was sutured in place. A  dressing was placed. The patient tolerated the procedure well without immediate post procedural complication.  CONTRAST:  None  FLUOROSCOPY TIME:  36 seconds.  COMPLICATIONS: None immediate  FINDINGS: After catheter placement, the tip lies within the superior cavoatrial junction. The catheter aspirates and flushes normally and is ready for immediate use.  IMPRESSION: Successful ultrasound and fluoroscopic guided placement of a right brachial vein approach, 36 cm, 5 French, dual lumen PICC with tip at the superior caval-atrial junction. The PICC line is ready for immediate use.  INDICATION: Poor venous access, in need of intravenous access for blood draws and medication administration   Electronically Signed   By: Sandi Mariscal M.D.   On: 06/18/2013 15:51   Ir US Guide Vasc Access Right  06/20/2013   CLINICAL DATA:  Encephalitis and need for tunneled catheter for plasmapheresis.  EXAM: TUNNELED CENTRAL VENOUS CATHETER PLACEMENT WITH ULTRASOUND AND FLUOROSCOPIC GUIDANCE  MEDICATIONS: 1 g IV vancomycin. Vancomycin was given within two hours of incision. Vancomycin was given due to an antibiotic allergy.  ANESTHESIA/SEDATION: 2.0 mg IV Versed; 100 mcg IV Fentanyl.  Total Moderate Sedation Time  Twenty minutes.  FLUOROSCOPY TIME:  42 seconds.  PROCEDURE: The procedure, risks, benefits, and alternatives were explained to the patient. Questions regarding the procedure were encouraged and answered. The patient understands and consents to the procedure.  The right neck and chest were prepped with chlorhexidine in a sterile fashion, and a sterile drape was applied covering the operative field. Maximum barrier sterile technique with sterile gowns and gloves were used for the procedure. Local anesthesia was provided with 1% lidocaine.  Ultrasound was used to confirm patency of the right internal jugular vein. After creating a small venotomy incision, a 21 gauge needle  was advanced into the right internal jugular vein under direct, real-time ultrasound guidance. Ultrasound image documentation was performed. After securing guidewire access, an 8 Fr dilator was placed. A J-wire was kinked to measure appropriate catheter length.  A Bard Equistream tunneled hemodialysis/pheresis catheter measuring 19 cm from tip to cuff was chosen for placement. This was tunneled in a retrograde fashion from the chest wall to the venotomy incision.  At the venotomy, serial dilatation was performed and a 16 Fr peel-away sheath was placed over a guidewire. The catheter was then placed through the sheath and the sheath removed. Final catheter positioning was confirmed and documented with a fluoroscopic spot image. The catheter was aspirated, flushed with saline, and injected with appropriate volume heparin dwells.  The venotomy incision was closed with subcutaneous 3-0 Monocryl and subcuticular 4-0 Vicryl. Dermabond was applied to the incision. The catheter exit site was secured with 0-Prolene retention sutures.  COMPLICATIONS: None.  No pneumothorax.  FINDINGS: After catheter placement, the tips lie in the right atrium. The catheter aspirates normally and is ready for immediate use.  IMPRESSION: Placement of tunneled pheresis catheter via the right internal jugular vein. The catheter tips lie in the right atrium. The catheter is ready for immediate use.   Electronically Signed   By: Aletta Edouard M.D.   On: 06/20/2013 14:27   Ir US Guide Vasc Access Right  06/18/2013   EXAM: ULTRASOUND AND FLUOROSCOPIC GUIDED PICC LINE INSERTION  MEDICATIONS: None.  TECHNIQUE: The procedure, risks, benefits, and alternatives were explained to the patient's son and informed written consent was obtained. A timeout was performed prior to the initiation of the procedure.  The right upper extremity was prepped with chlorhexidine in a sterile fashion, and a sterile drape  was applied covering the operative field. Maximum  barrier sterile technique with sterile gowns and gloves were used for the procedure. A timeout was performed prior to the initiation of the procedure. Local anesthesia was provided with 1% lidocaine.  Under direct ultrasound guidance, the right brachial vein was accessed with a micropuncture kit after the overlying soft tissues were anesthetized with 1% lidocaine. An ultrasound image was saved for documentation purposes. A guidewire was advanced to the level of the superior caval-atrial junction for measurement purposes and the PICC line was cut to length. A peel-away sheath was placed and a 36 cm, 5 [place], dual lumen was inserted to level of the superior caval-atrial junction. A post procedure spot fluoroscopic was obtained. The catheter easily aspirated and flushed and was sutured in place. A dressing was placed. The patient tolerated the procedure well without immediate post procedural complication.  CONTRAST:  None  FLUOROSCOPY TIME:  36 seconds.  COMPLICATIONS: None immediate  FINDINGS: After catheter placement, the tip lies within the superior cavoatrial junction. The catheter aspirates and flushes normally and is ready for immediate use.  IMPRESSION: Successful ultrasound and fluoroscopic guided placement of a right brachial vein approach, 36 cm, 5 French, dual lumen PICC with tip at the superior caval-atrial junction. The PICC line is ready for immediate use.  INDICATION: Poor venous access, in need of intravenous access for blood draws and medication administration   Electronically Signed   By: Sandi Mariscal M.D.   On: 06/18/2013 15:51   Dg Chest Port 1 View  07/03/2013   CLINICAL DATA:  Tubes and lines in good position, detailed above.  EXAM: PORTABLE CHEST - 1 VIEW  COMPARISON:  Chest CT 06/21/2013  FINDINGS: There is nasogastric tube which terminates near the pylorus. Right IJ catheter, tip in the upper right atrium. Right upper extremity PICC, tip at the upper cavoatrial junction.  Oral contrast noted  within the gastric fundus and small bowel.  Haziness of the right base, with volume loss. Node definitive consolidation.  No cardiomegaly.  IMPRESSION: 1. Negative for pneumonia. 2. Haziness of the right lower chest, likely pleural fluid or atelectasis. 3. Tubes and lines in good position, detailed above.   Electronically Signed   By: Jorje Guild M.D.   On: 07/03/2013 03:35   Dg Abd Portable 1v  06/30/2013   CLINICAL DATA:  NG tube placement.  EXAM: PORTABLE ABDOMEN - 1 VIEW  COMPARISON:  Single view of the abdomen 06/27/2013. Upper GI/small bowel follow-through 06/28/2013  FINDINGS: NG tube is in place in good position with the tip in this distal stomach. Contrast material in small bowel from the comparison upper GI series/small bowel follow-through. Small bowel loops are dilated. Only a small amount of loculated barium is seen in the colon.  IMPRESSION: NG tube in good position.  Small bowel obstruction.   Electronically Signed   By: Inge Rise M.D.   On: 06/30/2013 19:01   Dg Addison Bailey G Tube Plc W/fl-no Rad  06/17/2013   CLINICAL DATA: patient to have peg tube placement on January 20th   NASO G TUBE PLACEMENT WITH FLUORO  Fluoroscopy was utilized by the requesting physician.  No radiographic  interpretation.    Dg Ugi W/small Bowel  06/28/2013   CLINICAL DATA:  53 year old female postop 2 days since percutaneous jejunostomy tube placement. Drainage from NG tube. Abdominal pain. Initial encounter.  EXAM: UPPER GI SERIES WITH SMALL BOWEL FOLLOW-THROUGH  FLUOROSCOPIC GUIDED NG TUBE PLACEMENT  TECHNIQUE: Combined double contrast  and single contrast upper GI series using effervescent crystals, thick barium, and thin barium. Subsequently, serial images of the small bowel were obtained including spot views of the terminal ileum.  COMPARISON:  KUB 06/27/2013 and earlier.  FLUOROSCOPY TIME:  9 min and 18 seconds.  FINDINGS: Preprocedural scout view of the abdomen demonstrates a small volume of loculated  barium in the colon. This is related to the 06/17/2013 NG tube placement/ confirmation study.  Scout view demonstrates the recently placed right an mid lower abdomen percutaneous jejunostomy. Right lower abdomen skin staples in place.  The scout view also demonstrates the patient's NG tube is looped back up into the esophagus. This was evaluated in real-time with fluoroscopy, and a wire was placed through the tube in an effort to maneuver the tip back into the stomach. However, this was unsuccessful an the tube had be retracted into the nasopharynx. Using fluoroscopic guidance, the tube was then advanced with a wire into the stomach, such that the tip and side hole are within the stomach.  Given the recent abdominal surgery, initially water-soluble contrast was planned, however prominent gastroesophageal reflux of contrast was noted early on, and the decision was made to switch contrast to thin barium. Water-soluble contrast was fully aspirated from the stomach. Subsequently, barium contrast was slowly administered.  The stomach appears small. No gastrojejunostomy is evident. After a short delay, barium emptied the stomach into the proximal duodenum. The second portion of the duodenum is dilated.  After 20 additional min significantly dilated proximal jejunum is opacified, with the small bowel caliber up to 58 mm.  At this point study was discussed with Dr. Judeth Horn . We decided to inject the percutaneous jejunostomy tube, and give the barium more time to opacified a small bowel in hopes of loose to dating the small bowel obstruction transition point.  Barium injection through the jejunostomy tube demonstrates normal small bowel loops at the jejunostomy site. This barium was then aspirated.  Subsequently a 3 hr delayed image was obtained demonstrating a gradual tapering of small bowel loops into the right lower abdomen, heading toward the vicinity of the jejunostomy. These smaller loops measure 23 mm diameter  (arrow).  At this point, Dr. Hulen Skains advised that no additional imaging was needed.  IMPRESSION: 1. High-grade small bowel obstruction. Dilated duodenum and proximal jejunum up to 58 mm diameter. 2. Small bowel loops slowly taper to the right lower abdomen in the vicinity of the percutaneous jejunostomy. 3. The jejunostomy was injected with contrast, and is situated within normal decompressed small bowel. 4. Diminutive stomach. No gastrojejunostomy is evident. Prominent gastroesophageal reflux. 5. At the beginning of the exam the patient's NG tube was malpositioned. This was withdrawn to the nasopharynx and repositioned into the stomach under fluoroscopic guidance.   Electronically Signed   By: Lars Pinks M.D.   On: 06/28/2013 16:29    Medications: Scheduled Meds: . calcium gluconate IVPB  4 g Intravenous Once  . chlorhexidine  15 mL Mouth Rinse BID  . heparin  1,000 Units Intracatheter Once  . levETIRAcetam  1,000 mg Intravenous Q12H  . metoprolol  5 mg Intravenous Q6H  . mirtazapine  30 mg Oral QHS  . pantoprazole (PROTONIX) IV  40 mg Intravenous Q12H  . risperiDONE  0.5 mg Oral BID  . sodium chloride  10 mL Intravenous Q12H  . valproate sodium  500 mg Intravenous Q8H      LOS: 17 days   RAI,RIPUDEEP M.D. [facility] 07/06/2013, 11:09 AM  Pager: [phone]  If 7PM-7AM, please contact night-coverage [email or web address] Password [email or web address]

## 2013-07-06 NOTE — Progress Notes (Signed)
Patient ID: Joan PeelingLora [name], female   DOB: 01/01/1961, 53 y.o.   MRN: [id number] 4 Days Post-Op  Subjective: Pt a little more alert and responsive today.  C/o left arm/posterior shoulder pain.  rn reports constant watery diarrhea.  TFs at 40cc/hr, goal is 60cc/hr  Objective: Vital signs in last 24 hours: Temp:  [98 F (36.7 C)-98.9 F (37.2 C)] 98.3 F (36.8 C) (02/07 0700) Pulse Rate:  [109-137] 117 (02/07 0745) Resp:  [11-18] 14 (02/07 0745) BP: (107-119)/(71-83) 119/74 mmHg (02/07 0745) SpO2:  [100 %] 100 % (02/07 0745) Weight:  [165 lb 5.5 oz (75 kg)] 165 lb 5.5 oz (75 kg) (02/07 0339) Last BM Date: 07/01/13  Intake/Output from previous day: 02/06 0701 - 02/07 0700 In: 1970 [I.V.:1250; NG/GT:660] Out: 3554 [Urine:3550; Stool:4] Intake/Output this shift: Total I/O In: 125 [I.V.:125] Out: -   PE: Abd: soft, appropriately tender, incision c/d/i with staples.  J-tube in place, +BS  Lab Results:   Recent Labs  07/05/13 0530 07/06/13 0310  WBC 7.8 5.5  HGB 8.3* 8.0*  HCT 25.0* 24.3*  PLT 89* 81*   BMET  Recent Labs  07/05/13 0530 07/06/13 0310  NA 144 144  K 4.4 4.2  CL 108 109  CO2 25 26  GLUCOSE 104* 100*  BUN 4* <3*  CREATININE 0.60 0.60  CALCIUM 7.6* 7.4*   PT/INR No results found for this basename: LABPROT, INR,  in the last 72 hours CMP     Component Value Date/Time   NA 144 07/06/2013 0310   K 4.2 07/06/2013 0310   CL 109 07/06/2013 0310   CO2 26 07/06/2013 0310   GLUCOSE 100* 07/06/2013 0310   BUN <3* 07/06/2013 0310   CREATININE 0.60 07/06/2013 0310   CALCIUM 7.4* 07/06/2013 0310   PROT 4.1* 07/04/2013 1224   ALBUMIN 2.1* 07/04/2013 1224   AST 12 07/04/2013 1224   ALT 7 07/04/2013 1224   ALKPHOS 38* 07/04/2013 1224   BILITOT 0.4 07/04/2013 1224   GFRNONAA >90 07/06/2013 0310   GFRAA >90 07/06/2013 0310   Lipase     Component Value Date/Time   LIPASE 9* 06/30/2013 1300       Studies/Results: No results found.  Anti-infectives: Anti-infectives   Start      Dose/Rate Route Frequency Ordered Stop   07/02/13 1330  ciprofloxacin (CIPRO) IVPB 400 mg     400 mg 200 mL/hr over 60 Minutes Intravenous To Surgery 07/02/13 1321 07/02/13 1326   06/23/13 1400  piperacillin-tazobactam (ZOSYN) IVPB 3.375 g  Status:  Discontinued     3.375 g 12.5 mL/hr over 240 Minutes Intravenous 3 times per day 06/23/13 1336 06/26/13 1441   06/20/13 1200  vancomycin (VANCOCIN) IVPB 1000 mg/200 mL premix     1,000 mg 200 mL/hr over 60 Minutes Intravenous  Once 06/20/13 1146 06/20/13 1433       Assessment/Plan  1. POD 4, s/p revision of J-tube placement 2. Diarrhea secondary to TFs  Plan: 1. Diarrhea not unexpected secondary to TFs and h/o gastric bypass surgery.  she has shorter area of gut to absorb this in. 2. Will see if nutrition has any other recommendations for TFs 3. RN states flexi-seal being placed.   LOS: 17 days    Joan [name] E 07/06/2013, 8:51 AM Pager: [phone]

## 2013-07-07 LAB — CBC
HCT: 26.7 % — ABNORMAL LOW (ref 36.0–46.0)
HEMOGLOBIN: 8.8 g/dL — AB (ref 12.0–15.0)
MCH: 31.9 pg (ref 26.0–34.0)
MCHC: 33 g/dL (ref 30.0–36.0)
MCV: 96.7 fL (ref 78.0–100.0)
Platelets: 111 10*3/uL — ABNORMAL LOW (ref 150–400)
RBC: 2.76 MIL/uL — AB (ref 3.87–5.11)
RDW: 17.4 % — ABNORMAL HIGH (ref 11.5–15.5)
WBC: 7.4 10*3/uL (ref 4.0–10.5)

## 2013-07-07 LAB — GLUCOSE, CAPILLARY: Glucose-Capillary: 107 mg/dL — ABNORMAL HIGH (ref 70–99)

## 2013-07-07 LAB — BASIC METABOLIC PANEL
BUN: <3 mg/dL — ABNORMAL LOW (ref 6–23)
CHLORIDE: 106 meq/L (ref 96–112)
CO2: 25 meq/L (ref 19–32)
Calcium: 7.8 mg/dL — ABNORMAL LOW (ref 8.4–10.5)
Creatinine, Ser: 0.6 mg/dL (ref 0.50–1.10)
GFR calc Af Amer: >90 mL/min (ref >90)
GFR calc non Af Amer: >90 mL/min (ref >90)
GLUCOSE: 103 mg/dL — AB (ref 70–99)
POTASSIUM: 4.3 meq/L (ref 3.7–5.3)
Sodium: 141 mEq/L (ref 137–147)

## 2013-07-07 MED ORDER — CHOLESTYRAMINE LIGHT 4 G PO PACK
4.0000 g | PACK | Freq: Two times a day (BID) | ORAL | Status: DC
  Administered 2013-07-07: 4 g via ORAL
  Filled 2013-07-07 (×2): qty 1

## 2013-07-07 MED ORDER — PAREGORIC 2 MG/5ML PO TINC
10.0000 mL | Freq: Four times a day (QID) | ORAL | Status: DC
  Administered 2013-07-07 – 2013-07-11 (×16): 10 mL
  Filled 2013-07-07 (×18): qty 10

## 2013-07-07 NOTE — Progress Notes (Signed)
Patient still having significant tube feeding related diarrhea.  Midline wound is fine.  Would recommend medication for diarrhea.  Marta LamasJames O. Gae BonWyatt, [name], MD, FACS [phone]--pager [phone]--office [facility]

## 2013-07-07 NOTE — Progress Notes (Signed)
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 No further neurological intervention, neuro signed off on 2/3 -MRI of the brain showed no acute abnormality. Detailed results listed below.  -Ammonia level is normal  - CT chest and transvaginal ultrasound showed no abnormalities.  -[facility] paraneoplastic panel reported to be negative, MRI of the C-spine is negative for acute findings  - Continue with Ativan prn.     Diarrhea with Recent hx of c-diff (05/18/13)  - C. difficile negative, profuse diarrhea, about 900 cc out in last 24 hours with flexiseal - Placed on cholestyramine BID for 2 days - Nausea vomiting has improved, Osmolite tube feeds were discontinued yesterday and started on Jevity with no significant improvement in diarrhea   Anemia   Unclear etiology, s/p 2 unit s of prbc transfusion and started her on protonix . - No plans for endoscopy evaluation at the present time per gastroenterology  -  Anemia panel shows iron deficiency, has elevated ferritin, recommended to return to her primary gastroenterologist in [facility]. - Stool occult test negative  Tachycardia  - Patient continues to have sinus tachycardia probably secondary to hypotension from volume depletion and fever.  - 2-D echo showed EF of 55-60%, normal wall motion - Continue to correct the cause, IV fluids, beta blocker  Hx of Convulsions/Seizures  -Pt has a PMH of seizures and catatonia.  -MRI of the brain showed thalamic signal abnormality during previous admission. No longer present on current MRI.  -Pt  currently taking Keppra and Depakene.    hx of Anxiety  -Continue Remeron, Psych prescribed Risperdal .5 mg bid  Ativan prn for anxiety.   Hypertension  -Likely secondary to anxiety  Continue metoprolol and prn hydralazine.   Malnutrition with history of Gastric Bypass  -Per SNF patient has had no PO intake for over 1 week prior to admission.  -SNF sent her to hospital for J tube placement.  -Gen Surg consulted, J-tube placed and underwent revision of j tube withexploratory laparotomy on 2/3 and small bowel resection.   Thrombocytopenia  -Discontinue heparin, seems to be chronic up and down, could be secondary to folate deficiency.   Enterovaginal fistula  -Reported prior to that this patient had some stools in her vagina.  -Discussed with radiology, no evidence of rectovaginal fistula and the previous imaging, (patient had indicated pelvic scan done previously).  - Consulted GI, and the did not feel that patient will benefit from colonoscopy for the diagnosis of fistula.  - Dr Karleen Hampshire discussed with [facility] on-call, and they recommend to follow the patient as outpatient. No further workup required in the hospital.   Fever: Resolved Unclear etiology.. CXR negative for pneumonia, right lower lobe atelectasis. UA negative for infection, skin intact. Blood cultures negative so far. Venous dopplers negative for DVT.  [name]  DVT Prophylaxis: SCDs  Code Status: Full code  Family Communication:  Disposition: Likely transfer out to the floor tomorrow    Subjective: No complaints except Diarrhea, much more alert and awake today  Objective: Weight change: 2.5 kg (5 lb 8.2 oz)  Intake/Output Summary (Last 24 hours) at 07/07/13 1238 Last data  filed at 07/07/13 1200  Gross per 24 hour  Intake   4540 ml  Output   2925 ml  Net   1615 ml   Blood pressure 123/43, pulse 135, temperature 99.4 F (37.4 C), temperature source Oral, resp. rate 14, height 5' 3"  (1.6 m), weight 77.5 kg (170 lb 13.7  oz), SpO2 100.00%.  Physical Exam: General: Alert and awake,  not in any acute distress. CVS: S1-S2 clear, no murmur rubs or gallops Chest: clear to auscultation bilaterally, no wheezing, rales or rhonchi Abdomen: soft normal bowel sounds , J-tube+ Extremities: no cyanosis, clubbing or edema noted bilaterally GU: Flexi seal  Lab Results: Basic Metabolic Panel:  Recent Labs Lab 07/05/13 0530 07/06/13 0310 07/07/13 0325  NA 144 144 141  K 4.4 4.2 4.3  CL 108 109 106  CO2 25 26 25   GLUCOSE 104* 100* 103*  BUN 4* <3* <3*  CREATININE 0.60 0.60 0.60  CALCIUM 7.6* 7.4* 7.8*  MG 1.7  --   --   PHOS 2.6  --   --    Liver Function Tests:  Recent Labs Lab 07/04/13 1224  AST 12  ALT 7  ALKPHOS 38*  BILITOT 0.4  PROT 4.1*  ALBUMIN 2.1*    Recent Labs Lab 06/30/13 1300  LIPASE 9*   No results found for this basename: AMMONIA,  in the last 168 hours CBC:  Recent Labs Lab 07/06/13 0310 07/07/13 0325  WBC 5.5 7.4  HGB 8.0* 8.8*  HCT 24.3* 26.7*  MCV 97.2 96.7  PLT 81* 111*   Cardiac Enzymes: No results found for this basename: CKTOTAL, CKMB, CKMBINDEX, TROPONINI,  in the last 168 hours BNP: No components found with this basename: POCBNP,  CBG:  Recent Labs Lab 07/04/13 0728 07/04/13 2033 07/05/13 0726 07/06/13 0745 07/07/13 0723  GLUCAP 88 83 109* 100* 107*     Micro Results: Recent Results (from the past 240 hour(s))  CULTURE, BLOOD (ROUTINE X 2)     Status: None   Collection Time    07/03/13  6:40 AM      Result Value Range Status   Specimen Description BLOOD LEFT HAND   Final   Special Requests BOTTLES DRAWN AEROBIC ONLY 2CC   Final   Culture  Setup Time     Final   Value: 07/03/2013 13:19     Performed at [facility]   Culture     Final   Value:        BLOOD CULTURE RECEIVED NO GROWTH TO DATE CULTURE WILL BE HELD FOR 5 DAYS BEFORE ISSUING A FINAL NEGATIVE REPORT     Performed at [facility]   Report Status PENDING    Incomplete  CULTURE, BLOOD (ROUTINE X 2)     Status: None   Collection Time    07/03/13  6:55 AM      Result Value Range Status   Specimen Description BLOOD LEFT ARM   Final   Special Requests BOTTLES DRAWN AEROBIC ONLY 1CC   Final   Culture  Setup Time     Final   Value: 07/03/2013 13:19     Performed at [facility]   Culture     Final   Value:        BLOOD CULTURE RECEIVED NO GROWTH TO DATE CULTURE WILL BE HELD FOR 5 DAYS BEFORE ISSUING A FINAL NEGATIVE REPORT     Performed at [facility]   Report Status PENDING   Incomplete  CLOSTRIDIUM DIFFICILE BY PCR     Status: None   Collection Time    07/06/13 10:27 AM      Result Value Range Status   C difficile by pcr NEGATIVE  NEGATIVE Final    Studies/Results: Dg Abd 1 View  06/27/2013   CLINICAL DATA:  Nausea, abdominal pain, vomiting  EXAM: ABDOMEN - 1 VIEW  COMPARISON:  None.  FINDINGS: There is a surgical drain present in the mid abdomen. There are surgical stated in the right paramedian abdominal wall. There is a small amount of contrast scattered throughout the colon. There is no bowel dilatation to suggest obstruction. There is no evidence of pneumoperitoneum, portal venous gas or pneumatosis. There are no pathologic calcifications along the expected course of the ureters.The osseous structures are unremarkable.  IMPRESSION: Nonobstructive bowel gas pattern.   Electronically Signed   By: Kathreen Devoid   On: 06/27/2013 09:34   Dg Abd 1 View  06/17/2013   CLINICAL DATA:  NG tube placement.  EXAM: ABDOMEN - 1 VIEW  COMPARISON:  None.  FINDINGS: NG tube tip is in the peripyloric region in the right upper quadrant. Side port is in the mid to distal stomach.  Final image demonstrates injection of contrast filling the distal stomach and proximal duodenum.  IMPRESSION: NG tube tip in the peripyloric region.   Electronically Signed   By: Rolm Baptise M.D.   On: 06/17/2013 11:56   Ct Head Wo Contrast  06/13/2013   CLINICAL  DATA:  Altered mental status. Failure to thrive. Seizures.  EXAM: CT HEAD WITHOUT CONTRAST  TECHNIQUE: Contiguous axial images were obtained from the base of the skull through the vertex without intravenous contrast.  COMPARISON:  05/13/2013  FINDINGS: There is no evidence of intracranial hemorrhage, brain edema, or other signs of acute infarction. There is no evidence of intracranial mass lesion or mass effect. No abnormal extraaxial fluid collections are identified.  Mild diffuse cerebral atrophy is stable. No evidence hydrocephalus. No other intracranial abnormality identified. No skull fracture or other bone lesion identified.  IMPRESSION: No acute intracranial findings.  Stable mild cerebral atrophy.   Electronically Signed   By: Earle Gell M.D.   On: 06/13/2013 19:53   Ct Chest W Contrast  06/21/2013   CLINICAL DATA:  Altered mental status.  Evaluate for malignancy.  EXAM: CT CHEST WITH CONTRAST  TECHNIQUE: Multidetector CT imaging of the chest was performed during intravenous contrast administration.  CONTRAST:  86m OMNIPAQUE IOHEXOL 300 MG/ML  SOLN  COMPARISON:  Chest x-ray dated 05/11/2013  FINDINGS: Heart size and pulmonary vascularity are normal. There is no hilar or mediastinal adenopathy. The lungs are clear. No mass lesions, infiltrates, or effusions. Soft tissues of the chest are normal. Thyroid gland is normal. No acute osseous abnormality. Osteophytes fuse the thoracic spine from T6 through T10.  IMPRESSION: No evidence of malignancy or other significant abnormality of the chest.   Electronically Signed   By: JRozetta NunneryM.D.   On: 06/21/2013 17:13   Mr BJeri CosWZLContrast  06/20/2013   CLINICAL DATA:  Altered mental status. Possible autoimmune encephalitis.  EXAM: MRI HEAD WITHOUT AND WITH CONTRAST  TECHNIQUE: Multiplanar, multiecho pulse sequences of the brain and surrounding structures were obtained without and with intravenous contrast.  CONTRAST:  114mMULTIHANCE GADOBENATE DIMEGLUMINE  529 MG/ML IV SOLN  COMPARISON:  Head CT 06/13/2013 and brain MRI 05/16/2013  FINDINGS: Images are mildly to moderately degraded by motion artifact.  Incidental note  is again made of a partially empty sella. No definite residual abnormal T2 or diffusion weighted signal is identified in the thalami. Mild periventricular T2 hyperintensity is unchanged and nonspecific. No new areas of brain parenchymal signal abnormality are identified. There is mild cerebral atrophy. There is no evidence of acute infarct. There is no evidence of mass, midline shift, or extra-axial fluid collection. Orbits are unremarkable. Major intracranial vascular flow voids are unremarkable. Visualized paranasal sinuses and mastoid air cells are clear. There is no abnormal enhancement.  IMPRESSION: Interval resolution of thalamic signal abnormality. No evidence of new intracranial abnormality.   Electronically Signed   By: Logan Bores   On: 06/20/2013 17:26   Mr Cervical Spine Wo Contrast  06/22/2013   CLINICAL DATA:  Right hemiparesis. Possible autoimmune encephalitis.  EXAM: MRI CERVICAL SPINE WITHOUT CONTRAST  TECHNIQUE: Multiplanar, multisequence MR imaging was performed. No intravenous contrast was administered.  COMPARISON:  No comparison cervical spine MR. brain MR 06/20/2013.  FINDINGS: On this motion grade examination, artifact extends through the cervical cord however, no definitive cervical cord signal abnormality is noted. Cervical medullary junction unremarkable. Intracranial structures as detailed on recent MR of the brain.  Visualized paravertebral structures unremarkable. Both vertebral arteries are patent.  C2-3:  Negative.  C3-4:  Minimal right foraminal narrowing.  C4-5:  Minimal right foraminal narrowing.  C5-6:  Negative.  C6-7:  Negative.  C7-T1:  Negative.  T1-2:  Negative.  T2-3: Minimal Schmorl's node deformity.  Minimal bulge.  T3-4: Minimal bulge.  T4-5: Minimal bulge.  IMPRESSION: No evidence of cervical disc  herniation.  Minimal degenerative changes upper thoracic spine.  Please see above.   Electronically Signed   By: Chauncey Cruel M.D.   On: 06/22/2013 19:38   US Transvaginal Non-ob  06/21/2013   CLINICAL DATA:  Altered mental status. Evaluation for possible paraneoplastic syndrome.  EXAM: TRANSABDOMINAL AND TRANSVAGINAL ULTRASOUND OF PELVIS  TECHNIQUE: Both transabdominal and transvaginal ultrasound examinations of the pelvis were performed. Transabdominal technique was performed for global imaging of the pelvis including uterus, ovaries, adnexal regions, and pelvic cul-de-sac. It was necessary to proceed with endovaginal exam following the transabdominal exam to visualize the ovaries.  COMPARISON:  None  FINDINGS: Uterus  Removed.  Right ovary  Not visualized.  Left ovary  Not visualized.  Other findings  No free fluid.  IMPRESSION: The ovaries could not be visualized on transvaginal or transabdominal pelvic ultrasound. However, review of the CT scan of the pelvis dated 05/17/2013 does demonstrate that both ovaries appear normal, measuring 23 mm on the right and 16 mm on the left.   Electronically Signed   By: Rozetta Nunnery M.D.   On: 06/21/2013 16:45   US Pelvis Complete  06/21/2013   CLINICAL DATA:  Altered mental status. Evaluation for possible paraneoplastic syndrome.  EXAM: TRANSABDOMINAL AND TRANSVAGINAL ULTRASOUND OF PELVIS  TECHNIQUE: Both transabdominal and transvaginal ultrasound examinations of the pelvis were performed. Transabdominal technique was performed for global imaging of the pelvis including uterus, ovaries, adnexal regions, and pelvic cul-de-sac. It was necessary to proceed with endovaginal exam following the transabdominal exam to visualize the ovaries.  COMPARISON:  None  FINDINGS: Uterus  Removed.  Right ovary  Not visualized.  Left ovary  Not visualized.  Other findings  No free fluid.  IMPRESSION: The ovaries could not be visualized on transvaginal or transabdominal pelvic ultrasound.  However, review of the CT scan of the pelvis dated 05/17/2013 does demonstrate that both ovaries appear normal, measuring  23 mm on the right and 16 mm on the left.   Electronically Signed   By: Rozetta Nunnery M.D.   On: 06/21/2013 16:45   Ir Fluoro Guide Cv Line Right  06/20/2013   CLINICAL DATA:  Encephalitis and need for tunneled catheter for plasmapheresis.  EXAM: TUNNELED CENTRAL VENOUS CATHETER PLACEMENT WITH ULTRASOUND AND FLUOROSCOPIC GUIDANCE  MEDICATIONS: 1 g IV vancomycin. Vancomycin was given within two hours of incision. Vancomycin was given due to an antibiotic allergy.  ANESTHESIA/SEDATION: 2.0 mg IV Versed; 100 mcg IV Fentanyl.  Total Moderate Sedation Time  Twenty minutes.  FLUOROSCOPY TIME:  42 seconds.  PROCEDURE: The procedure, risks, benefits, and alternatives were explained to the patient. Questions regarding the procedure were encouraged and answered. The patient understands and consents to the procedure.  The right neck and chest were prepped with chlorhexidine in a sterile fashion, and a sterile drape was applied covering the operative field. Maximum barrier sterile technique with sterile gowns and gloves were used for the procedure. Local anesthesia was provided with 1% lidocaine.  Ultrasound was used to confirm patency of the right internal jugular vein. After creating a small venotomy incision, a 21 gauge needle was advanced into the right internal jugular vein under direct, real-time ultrasound guidance. Ultrasound image documentation was performed. After securing guidewire access, an 8 Fr dilator was placed. A J-wire was kinked to measure appropriate catheter length.  A Bard Equistream tunneled hemodialysis/pheresis catheter measuring 19 cm from tip to cuff was chosen for placement. This was tunneled in a retrograde fashion from the chest wall to the venotomy incision.  At the venotomy, serial dilatation was performed and a 16 Fr peel-away sheath was placed over a guidewire. The  catheter was then placed through the sheath and the sheath removed. Final catheter positioning was confirmed and documented with a fluoroscopic spot image. The catheter was aspirated, flushed with saline, and injected with appropriate volume heparin dwells.  The venotomy incision was closed with subcutaneous 3-0 Monocryl and subcuticular 4-0 Vicryl. Dermabond was applied to the incision. The catheter exit site was secured with 0-Prolene retention sutures.  COMPLICATIONS: None.  No pneumothorax.  FINDINGS: After catheter placement, the tips lie in the right atrium. The catheter aspirates normally and is ready for immediate use.  IMPRESSION: Placement of tunneled pheresis catheter via the right internal jugular vein. The catheter tips lie in the right atrium. The catheter is ready for immediate use.   Electronically Signed   By: Aletta Edouard M.D.   On: 06/20/2013 14:27   Ir Fluoro Guide Cv Line Right  06/18/2013   EXAM: ULTRASOUND AND FLUOROSCOPIC GUIDED PICC LINE INSERTION  MEDICATIONS: None.  TECHNIQUE: The procedure, risks, benefits, and alternatives were explained to the patient's son and informed written consent was obtained. A timeout was performed prior to the initiation of the procedure.  The right upper extremity was prepped with chlorhexidine in a sterile fashion, and a sterile drape was applied covering the operative field. Maximum barrier sterile technique with sterile gowns and gloves were used for the procedure. A timeout was performed prior to the initiation of the procedure. Local anesthesia was provided with 1% lidocaine.  Under direct ultrasound guidance, the right brachial vein was accessed with a micropuncture kit after the overlying soft tissues were anesthetized with 1% lidocaine. An ultrasound image was saved for documentation purposes. A guidewire was advanced to the level of the superior caval-atrial junction for measurement purposes and the PICC line was cut to length.  A peel-away  sheath was placed and a 36 cm, 5 [place], dual lumen was inserted to level of the superior caval-atrial junction. A post procedure spot fluoroscopic was obtained. The catheter easily aspirated and flushed and was sutured in place. A dressing was placed. The patient tolerated the procedure well without immediate post procedural complication.  CONTRAST:  None  FLUOROSCOPY TIME:  36 seconds.  COMPLICATIONS: None immediate  FINDINGS: After catheter placement, the tip lies within the superior cavoatrial junction. The catheter aspirates and flushes normally and is ready for immediate use.  IMPRESSION: Successful ultrasound and fluoroscopic guided placement of a right brachial vein approach, 36 cm, 5 French, dual lumen PICC with tip at the superior caval-atrial junction. The PICC line is ready for immediate use.  INDICATION: Poor venous access, in need of intravenous access for blood draws and medication administration   Electronically Signed   By: Sandi Mariscal M.D.   On: 06/18/2013 15:51   Ir US Guide Vasc Access Right  06/20/2013   CLINICAL DATA:  Encephalitis and need for tunneled catheter for plasmapheresis.  EXAM: TUNNELED CENTRAL VENOUS CATHETER PLACEMENT WITH ULTRASOUND AND FLUOROSCOPIC GUIDANCE  MEDICATIONS: 1 g IV vancomycin. Vancomycin was given within two hours of incision. Vancomycin was given due to an antibiotic allergy.  ANESTHESIA/SEDATION: 2.0 mg IV Versed; 100 mcg IV Fentanyl.  Total Moderate Sedation Time  Twenty minutes.  FLUOROSCOPY TIME:  42 seconds.  PROCEDURE: The procedure, risks, benefits, and alternatives were explained to the patient. Questions regarding the procedure were encouraged and answered. The patient understands and consents to the procedure.  The right neck and chest were prepped with chlorhexidine in a sterile fashion, and a sterile drape was applied covering the operative field. Maximum barrier sterile technique with sterile gowns and gloves were used for the procedure. Local  anesthesia was provided with 1% lidocaine.  Ultrasound was used to confirm patency of the right internal jugular vein. After creating a small venotomy incision, a 21 gauge needle was advanced into the right internal jugular vein under direct, real-time ultrasound guidance. Ultrasound image documentation was performed. After securing guidewire access, an 8 Fr dilator was placed. A J-wire was kinked to measure appropriate catheter length.  A Bard Equistream tunneled hemodialysis/pheresis catheter measuring 19 cm from tip to cuff was chosen for placement. This was tunneled in a retrograde fashion from the chest wall to the venotomy incision.  At the venotomy, serial dilatation was performed and a 16 Fr peel-away sheath was placed over a guidewire. The catheter was then placed through the sheath and the sheath removed. Final catheter positioning was confirmed and documented with a fluoroscopic spot image. The catheter was aspirated, flushed with saline, and injected with appropriate volume heparin dwells.  The venotomy incision was closed with subcutaneous 3-0 Monocryl and subcuticular 4-0 Vicryl. Dermabond was applied to the incision. The catheter exit site was secured with 0-Prolene retention sutures.  COMPLICATIONS: None.  No pneumothorax.  FINDINGS: After catheter placement, the tips lie in the right atrium. The catheter aspirates normally and is ready for immediate use.  IMPRESSION: Placement of tunneled pheresis catheter via the right internal jugular vein. The catheter tips lie in the right atrium. The catheter is ready for immediate use.   Electronically Signed   By: Aletta Edouard M.D.   On: 06/20/2013 14:27   Ir US Guide Vasc Access Right  06/18/2013   EXAM: ULTRASOUND AND FLUOROSCOPIC GUIDED PICC LINE INSERTION  MEDICATIONS: None.  TECHNIQUE: The procedure, risks, benefits,  and alternatives were explained to the patient's son and informed written consent was obtained. A timeout was performed prior to the  initiation of the procedure.  The right upper extremity was prepped with chlorhexidine in a sterile fashion, and a sterile drape was applied covering the operative field. Maximum barrier sterile technique with sterile gowns and gloves were used for the procedure. A timeout was performed prior to the initiation of the procedure. Local anesthesia was provided with 1% lidocaine.  Under direct ultrasound guidance, the right brachial vein was accessed with a micropuncture kit after the overlying soft tissues were anesthetized with 1% lidocaine. An ultrasound image was saved for documentation purposes. A guidewire was advanced to the level of the superior caval-atrial junction for measurement purposes and the PICC line was cut to length. A peel-away sheath was placed and a 36 cm, 5 [place], dual lumen was inserted to level of the superior caval-atrial junction. A post procedure spot fluoroscopic was obtained. The catheter easily aspirated and flushed and was sutured in place. A dressing was placed. The patient tolerated the procedure well without immediate post procedural complication.  CONTRAST:  None  FLUOROSCOPY TIME:  36 seconds.  COMPLICATIONS: None immediate  FINDINGS: After catheter placement, the tip lies within the superior cavoatrial junction. The catheter aspirates and flushes normally and is ready for immediate use.  IMPRESSION: Successful ultrasound and fluoroscopic guided placement of a right brachial vein approach, 36 cm, 5 French, dual lumen PICC with tip at the superior caval-atrial junction. The PICC line is ready for immediate use.  INDICATION: Poor venous access, in need of intravenous access for blood draws and medication administration   Electronically Signed   By: Sandi Mariscal M.D.   On: 06/18/2013 15:51   Dg Chest Port 1 View  07/03/2013   CLINICAL DATA:  Tubes and lines in good position, detailed above.  EXAM: PORTABLE CHEST - 1 VIEW  COMPARISON:  Chest CT 06/21/2013  FINDINGS: There is  nasogastric tube which terminates near the pylorus. Right IJ catheter, tip in the upper right atrium. Right upper extremity PICC, tip at the upper cavoatrial junction.  Oral contrast noted within the gastric fundus and small bowel.  Haziness of the right base, with volume loss. Node definitive consolidation.  No cardiomegaly.  IMPRESSION: 1. Negative for pneumonia. 2. Haziness of the right lower chest, likely pleural fluid or atelectasis. 3. Tubes and lines in good position, detailed above.   Electronically Signed   By: Jorje Guild M.D.   On: 07/03/2013 03:35   Dg Abd Portable 1v  06/30/2013   CLINICAL DATA:  NG tube placement.  EXAM: PORTABLE ABDOMEN - 1 VIEW  COMPARISON:  Single view of the abdomen 06/27/2013. Upper GI/small bowel follow-through 06/28/2013  FINDINGS: NG tube is in place in good position with the tip in this distal stomach. Contrast material in small bowel from the comparison upper GI series/small bowel follow-through. Small bowel loops are dilated. Only a small amount of loculated barium is seen in the colon.  IMPRESSION: NG tube in good position.  Small bowel obstruction.   Electronically Signed   By: Inge Rise M.D.   On: 06/30/2013 19:01   Dg Addison Bailey G Tube Plc W/fl-no Rad  06/17/2013   CLINICAL DATA: patient to have peg tube placement on January 20th   NASO G TUBE PLACEMENT WITH FLUORO  Fluoroscopy was utilized by the requesting physician.  No radiographic  interpretation.    Dg Ugi W/small Bowel  06/28/2013  CLINICAL DATA:  53 year old female postop 2 days since percutaneous jejunostomy tube placement. Drainage from NG tube. Abdominal pain. Initial encounter.  EXAM: UPPER GI SERIES WITH SMALL BOWEL FOLLOW-THROUGH  FLUOROSCOPIC GUIDED NG TUBE PLACEMENT  TECHNIQUE: Combined double contrast and single contrast upper GI series using effervescent crystals, thick barium, and thin barium. Subsequently, serial images of the small bowel were obtained including spot views of the  terminal ileum.  COMPARISON:  KUB 06/27/2013 and earlier.  FLUOROSCOPY TIME:  9 min and 18 seconds.  FINDINGS: Preprocedural scout view of the abdomen demonstrates a small volume of loculated barium in the colon. This is related to the 06/17/2013 NG tube placement/ confirmation study.  Scout view demonstrates the recently placed right an mid lower abdomen percutaneous jejunostomy. Right lower abdomen skin staples in place.  The scout view also demonstrates the patient's NG tube is looped back up into the esophagus. This was evaluated in real-time with fluoroscopy, and a wire was placed through the tube in an effort to maneuver the tip back into the stomach. However, this was unsuccessful an the tube had be retracted into the nasopharynx. Using fluoroscopic guidance, the tube was then advanced with a wire into the stomach, such that the tip and side hole are within the stomach.  Given the recent abdominal surgery, initially water-soluble contrast was planned, however prominent gastroesophageal reflux of contrast was noted early on, and the decision was made to switch contrast to thin barium. Water-soluble contrast was fully aspirated from the stomach. Subsequently, barium contrast was slowly administered.  The stomach appears small. No gastrojejunostomy is evident. After a short delay, barium emptied the stomach into the proximal duodenum. The second portion of the duodenum is dilated.  After 20 additional min significantly dilated proximal jejunum is opacified, with the small bowel caliber up to 58 mm.  At this point study was discussed with Dr. Judeth Horn . We decided to inject the percutaneous jejunostomy tube, and give the barium more time to opacified a small bowel in hopes of loose to dating the small bowel obstruction transition point.  Barium injection through the jejunostomy tube demonstrates normal small bowel loops at the jejunostomy site. This barium was then aspirated.  Subsequently a 3 hr delayed image  was obtained demonstrating a gradual tapering of small bowel loops into the right lower abdomen, heading toward the vicinity of the jejunostomy. These smaller loops measure 23 mm diameter (arrow).  At this point, Dr. Hulen Skains advised that no additional imaging was needed.  IMPRESSION: 1. High-grade small bowel obstruction. Dilated duodenum and proximal jejunum up to 58 mm diameter. 2. Small bowel loops slowly taper to the right lower abdomen in the vicinity of the percutaneous jejunostomy. 3. The jejunostomy was injected with contrast, and is situated within normal decompressed small bowel. 4. Diminutive stomach. No gastrojejunostomy is evident. Prominent gastroesophageal reflux. 5. At the beginning of the exam the patient's NG tube was malpositioned. This was withdrawn to the nasopharynx and repositioned into the stomach under fluoroscopic guidance.   Electronically Signed   By: Lars Pinks M.D.   On: 06/28/2013 16:29    Medications: Scheduled Meds: . chlorhexidine  15 mL Mouth Rinse BID  . cholestyramine light  4 g Oral BID  . levETIRAcetam  1,000 mg Intravenous Q12H  . metoprolol  5 mg Intravenous Q6H  . mirtazapine  30 mg Oral QHS  . pantoprazole (PROTONIX) IV  40 mg Intravenous Q12H  . paregoric  10 mL Per Tube Q6H  .  risperiDONE  0.5 mg Oral BID  . sodium chloride  10 mL Intravenous Q12H  . valproate sodium  500 mg Intravenous Q8H      LOS: 18 days   Tyrah Broers M.D. [facility] 07/07/2013, 12:38 PM Pager: [phone]  If 7PM-7AM, please contact night-coverage [email or web address] Password [email or web address]

## 2013-07-08 ENCOUNTER — Encounter (HOSPITAL_COMMUNITY): Payer: Self-pay | Admitting: General Surgery

## 2013-07-08 LAB — GI PATHOGEN PANEL BY PCR, STOOL
C difficile toxin A/B: NEGATIVE
CAMPYLOBACTER BY PCR: NEGATIVE
CRYPTOSPORIDIUM BY PCR: NEGATIVE
E COLI (ETEC) LT/ST: NEGATIVE
E COLI (STEC): NEGATIVE
E COLI 0157 BY PCR: NEGATIVE
G lamblia by PCR: NEGATIVE
Norovirus GI/GII: NEGATIVE
Rotavirus A by PCR: POSITIVE
Salmonella by PCR: NEGATIVE
Shigella by PCR: NEGATIVE

## 2013-07-08 LAB — CBC
HEMATOCRIT: 24.9 % — AB (ref 36.0–46.0)
Hemoglobin: 8 g/dL — ABNORMAL LOW (ref 12.0–15.0)
MCH: 31.4 pg (ref 26.0–34.0)
MCHC: 32.1 g/dL (ref 30.0–36.0)
MCV: 97.6 fL (ref 78.0–100.0)
PLATELETS: 97 10*3/uL — AB (ref 150–400)
RBC: 2.55 MIL/uL — ABNORMAL LOW (ref 3.87–5.11)
RDW: 17.5 % — ABNORMAL HIGH (ref 11.5–15.5)
WBC: 5.8 10*3/uL (ref 4.0–10.5)

## 2013-07-08 LAB — BASIC METABOLIC PANEL
BUN: <3 mg/dL — ABNORMAL LOW (ref 6–23)
CHLORIDE: 111 meq/L (ref 96–112)
CO2: 27 mEq/L (ref 19–32)
Calcium: 7.9 mg/dL — ABNORMAL LOW (ref 8.4–10.5)
Creatinine, Ser: 0.62 mg/dL (ref 0.50–1.10)
GFR calc Af Amer: >90 mL/min (ref >90)
GFR calc non Af Amer: >90 mL/min (ref >90)
Glucose, Bld: 102 mg/dL — ABNORMAL HIGH (ref 70–99)
Potassium: 4.6 mEq/L (ref 3.7–5.3)
SODIUM: 145 meq/L (ref 137–147)

## 2013-07-08 LAB — MAGNESIUM: MAGNESIUM: 1.9 mg/dL (ref 1.5–2.5)

## 2013-07-08 LAB — PHOSPHORUS: PHOSPHORUS: 2.7 mg/dL (ref 2.3–4.6)

## 2013-07-08 LAB — GLUCOSE, CAPILLARY: Glucose-Capillary: 114 mg/dL — ABNORMAL HIGH (ref 70–99)

## 2013-07-08 MED ORDER — LOPERAMIDE HCL 1 MG/5ML PO LIQD
2.0000 mg | ORAL | Status: DC | PRN
  Administered 2013-07-08: 2 mg
  Filled 2013-07-08 (×2): qty 10

## 2013-07-08 MED ORDER — VITAL AF 1.2 CAL PO LIQD
1000.0000 mL | ORAL | Status: DC
  Administered 2013-07-09: 1000 mL
  Filled 2013-07-08 (×5): qty 1000

## 2013-07-08 NOTE — Progress Notes (Signed)
[facility] PROGRESS NOTE  Joan [name] ZOX:[id number] DOB: [birth year] DOA: 06/19/2013 PCP: [name], MD  HPI/Subjective: 53 yr old female presents with a CC of Altered mental status. Pt has a PMH of peripheral neuropathy, seizures, catatonia, anxiety, HTN, and fibromyalgia. Pt was previously admitted on 12/13 thru 12/28 with toxic metabolic encephalopathy.  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  EEG showed no recurrent seizures. A 24 hour EEG  showed an intermittent delta pattern felt to represent Frontal Intermittent Rhythmic Delta Activity. Pt was started on two antiepileptics.  This admission, she was again found altered and seemed "comatose" . Since d/c in December, pt has had persistent confusion and is confined to a wheelchair at [facility]. She is currently confused and experiencing visual hallucinations.She is s/p J tube placement and followed with intractable nausea, vomiting. Surgery following, . ON 2/3 night pt developed fever of 101.4, became tachycardic and hypotensive. CXR, done , UA ordered and blood cultures pending. Her work up so far negative for infection.   H&H remains stable at 8.5 , and she was found to have mild leukocytosis.  Subjective:  Not in any distress, denies any pain or sob on asking, coughing a little,. Not bringing up any sputum.   She is oriented to place .    Assessment/Plan:  Anemia Unclear etiology, s/p 2 unit s of prbc transfusion and started her on protonix . Her stool for occult blood negative in the last 2 weeks. Anemia panel showed severe iron deficiency . Will need IV iron.  She might require EGD once more stable, GI consulted recommended outpatient follow up with her GI physician at [facility].   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  -Pt received diatech catheter placement and plasmapheresis on 06/20/13.    -Plan is for plasmapheresis every other day and Solumedrol 500 mg bid x 3 days. D/C on 1/24. -MRI of the brain showed no acute abnormality.  Detailed results listed below. -Ammonia level is normal - CT chest and transvaginal ultrasound showed no abnormalities. -[facility] paraneoplastic panel reported to be negative, MRI of the C-spine is negative for acute findings - Continue with Ativan prn. Pending:   serum NMDA.   Tachycardia - Patient continues to have sinus tachycardia probably secondary to hypotension from volume depletion and fever.  - appears to be sinus tachy, will get 12 lead EKG .  -Continue with IV metoprolol 5 mg q  6 hrs. - echocardiogram ordered for further evaluation and is pending.   Hx of Convulsions/Seizures  -Pt has a PMH of seizures and catatonia.   -MRI of the brain showed thalamic signal abnormality during previous admission.  No longer present on current MRI. -Pt currently taking Keppra and Depakene.  Increased Depakene to 500mg  tid -Valproic acid lvl 35.1 was low.  Repeat Depakote level is 76.6  Hypokalemia - Repleted  Nausea&Vomiting Started after tube feeds was initiated, which has resolved.  Continue zofran, phenergan prn She underwent revision of the j tube per surgery and small bowel resection on 2/3. Ng tube was taken out and she was started on very low rate tube feeds.   Hx of Anxiety  -Continue pt on Remeron -Patient appears to have had a psychiatric history  -Current illness is bringing out psychiatric symptoms. -Psychiatry consulted as psychotropic medications may help with her current symptoms. -Psych prescribed Risperdal .5 mg bid Ativan prn for anxiety.  Hypertension  -Pt has a PMH of HTN  -Likely secondary to anxiety  Continue metoprolol and prn hydralazine.   Malnutrition with history of Gastric Bypass -Per SNF patient has had no PO intake for over 1 week prior to admission. -SNF sent her to hospital for J tube placement. -Gen Surg  consulted, J-tube placed and underwent revision of j tube withexploratory laparotomy on 2/3 and small bowel resection.   Phosphate deficiency - K phos IV  X 1  - repleted.   Recent hx of c-diff (05/18/13)  -Pt negative for C-diff at this admit  Thrombocytopenia -Discontinue heparin, on SCD'S  Enterovaginal fistula -Reported prior to that this patient had some stools in her vagina. -Discussed with radiology, no evidence of rectovaginal fistula and the previous imaging, (patient had indicated pelvic scan done previously). - Consulted GI, and the did not feel that patient will benefit from colonoscopy for the diagnosis of fistula. - Called and discussed with Gyn on-call, and they recommend to follow the patient as outpatient. No further workup required in the hospital.  Fever:  Unclear etiology resolved. CXR negative for pneumonia, right lower lobe atelectasis. UA negative for infection, skin intact. Blood cultures done and pending. Venous dopplers negative for DVT. [name]   DVT Prophylaxis:  SCD';s  Code Status: Full Code  Disposition Plan: D/C to SNF when medically appropriate.transfer to telemetry.    Consultants:  Surgery  Neuro  Procedures:  Plasmapheresis  PICC Line Placement  Antibiotics:  None  Family communication None at bedside.   Objective: Filed Vitals:   07/07/13 1550 07/07/13 1926 07/07/13 2342 07/08/13 0337  BP: 133/76 105/47 116/72 130/73  Pulse: 124 128 137 128  Temp: 98.9 F (37.2 C) 98.1 F (36.7 C) 98.7 F (37.1 C) 98.2 F (36.8 C)  TempSrc: Oral Oral Oral Oral  Resp: 20 14 20 12   Height:      Weight:    78.3 kg (172 lb 9.9 oz)  SpO2: 100% 100% 99% 100%    Intake/Output Summary (Last 24 hours) at 07/08/13 1122 Last data filed at 07/08/13 0500  Gross per 24 hour  Intake   3320 ml  Output   3500 ml  Net   -180 ml   Filed Weights   07/06/13 0339 07/07/13 0309 07/08/13 0337  Weight: 75 kg (165 lb 5.5 oz) 77.5 kg (170 lb 13.7 oz) 78.3  kg (172 lb 9.9 oz)    Exam: Physical Exam: wakes up on verbal cues,  no distress.  Eyes: No signs of jaundice, pale conjunctiva,  EOMI Nose: NG tube in place Throat: Oropharynx nonerythematous, no exudate appreciated.  Neck: supple,No deformities, masses, or tenderness noted. Chest wall tender to palpation. Lungs: Normal respiratory effort. Scattered rhonchi bilaterally.  Heart: tachycardic regular. S1 and S2 normal  Abdomen: BS normoactive. Soft, Nondistended, non-tender.  Extremities: 1+ pedal edema, extremities tender to touch, no erythema.       Data Reviewed: Basic Metabolic Panel:  Recent Labs Lab 07/04/13 0440 07/05/13 0530 07/06/13 0310 07/07/13 0325 07/08/13 0430  NA 142 144 144 141 145  K 4.1 4.4 4.2 4.3 4.6  CL 107 108 109 106 111  CO2 25 25 26 25 27   GLUCOSE 93 104* 100* 103* 102*  BUN 5* 4* <3* <3* <3*  CREATININE 0.62 0.60 0.60 0.60 0.62  CALCIUM 7.8* 7.6* 7.4* 7.8* 7.9*  MG 1.7 1.7  --   --  1.9  PHOS  --  2.6  --   --  2.7  Liver Function Tests:  Recent Labs Lab 07/04/13 1224  AST 12  ALT 7  ALKPHOS 38*  BILITOT 0.4  PROT 4.1*  ALBUMIN 2.1*   No results found for this basename: AMMONIA,  in the last 168 hours CBC:  Recent Labs Lab 07/04/13 0440 07/05/13 0530 07/06/13 0310 07/07/13 0325 07/08/13 0430  WBC 11.0* 7.8 5.5 7.4 5.8  HGB 8.5* 8.3* 8.0* 8.8* 8.0*  HCT 25.9* 25.0* 24.3* 26.7* 24.9*  MCV 96.6 96.2 97.2 96.7 97.6  PLT 83* 89* 81* 111* 97*   CBG:  Recent Labs Lab 07/04/13 2033 07/05/13 0726 07/06/13 0745 07/07/13 0723 07/08/13 0757  GLUCAP 83 109* 100* 107* 114*    Recent Results (from the past 240 hour(s))  CULTURE, BLOOD (ROUTINE X 2)     Status: None   Collection Time    07/03/13  6:40 AM      Result Value Range Status   Specimen Description BLOOD LEFT HAND   Final   Special Requests BOTTLES DRAWN AEROBIC ONLY 2CC   Final   Culture  Setup Time     Final   Value: 07/03/2013 13:19     Performed at [facility]   Culture     Final   Value:        BLOOD CULTURE RECEIVED NO GROWTH TO DATE CULTURE WILL BE HELD FOR 5 DAYS BEFORE ISSUING A FINAL NEGATIVE REPORT     Performed at [facility]   Report Status PENDING   Incomplete  CULTURE, BLOOD (ROUTINE X 2)     Status: None   Collection Time    07/03/13  6:55 AM      Result Value Range Status   Specimen Description BLOOD LEFT ARM   Final   Special Requests BOTTLES DRAWN AEROBIC ONLY 1CC   Final   Culture  Setup Time     Final   Value: 07/03/2013 13:19     Performed at [facility]   Culture     Final   Value:        BLOOD CULTURE RECEIVED NO GROWTH TO DATE CULTURE WILL BE HELD FOR 5 DAYS BEFORE ISSUING A FINAL NEGATIVE REPORT     Performed at [facility]   Report Status PENDING   Incomplete  CLOSTRIDIUM DIFFICILE BY PCR     Status: None   Collection Time    07/06/13 10:27 AM      Result Value Range Status   C difficile by pcr NEGATIVE  NEGATIVE Final     Studies: No results found.  Scheduled Meds: . chlorhexidine  15 mL Mouth Rinse BID  . levETIRAcetam  1,000 mg Intravenous Q12H  . metoprolol  5 mg Intravenous Q6H  . mirtazapine  30 mg Oral QHS  . pantoprazole (PROTONIX) IV  40 mg Intravenous Q12H  . paregoric  10 mL Per Tube Q6H  . risperiDONE  0.5 mg Oral BID  . sodium chloride  10 mL Intravenous Q12H  . valproate sodium  500 mg Intravenous Q8H   Continuous Infusions: . citrate dextrose    . dextrose 5 % and 0.45 % NaCl with KCl 20 mEq/L 125 mL/hr at 07/08/13 0547  . feeding supplement (VITAL AF 1.2 CAL)      Principal Problem:   Acute encephalopathy Active Problems:   Convulsions/seizures   Toxic metabolic encephalopathy   Psychosis   Nausea with vomiting   Malfunction of jejunostomy tube   S/P small bowel resection  Sgmc Berrien CampusKULA,Geovana Gebel [facility] Pager [phone]. If 7PM-7AM, please contact night-coverage at [email or web address], password [email or web address] 07/08/2013, 11:22 AM  LOS: 19 days

## 2013-07-08 NOTE — Clinical Social Work Note (Signed)
CS[facility] explained that [facility] did not offer patient a bed. Son is wanting to speak with MD again. CSW text paging MD now.   Roddie McBryant Jayd Forrey, Milton CenterLCSWA, [place]ASA, [phone]

## 2013-07-08 NOTE — Progress Notes (Signed)
Pt transferred to 5W08 per MD order. Report called to receiving nurse and all questions answered.

## 2013-07-08 NOTE — Progress Notes (Signed)
D/c foley per protocol. Pt. Tolerated procedure well, will continue to monitor.

## 2013-07-08 NOTE — Progress Notes (Signed)
NUTRITION FOLLOW-UP  INTERVENTION: Change TF to elemental feeds (discussed with Dr. Grandville Silos). Discontinue Jevity 1.2. Initiate elemental formula Vital AF 1.2 at 30 ml/hr and advance q 4 hours to goal rate of 60 ml/hr. Goal rate will provide: 1728 kcal, 108 grams protein, 1168 ml free water.  RD to continue to follow nutrition care plan.  NUTRITION DIAGNOSIS: Inadequate oral intake related to AMS as evidenced by limited oral intake.  Ongoing.  Goal: Intake to meet >90% of estimated nutrition needs. Met.  Monitor:  weight trends, lab trends, I/O's, TF tolerance and adequacy  ASSESSMENT: PMHx significant for catatonia, HTN, anxiety, gastric bypass. From SNF.  Recent admission from 12/13 - 12/28 with toxic metabolic encephalopathy, hospital course was complicated with pyelonephritis and C. Difficile colitis. Pt was scheduled for PEG on 1/20, was found to be agitated and confused and was sent to ED.   Neuro saw pt on 1/21 and recommended plasma exchanges every other day as well as suppressive steroids to reduce her seizure foci.  Pt developed frequent amount of large stooling from vagina on 1/25 and was sent to SDU. Remains in SDU at this time.  1/27 - Underwent open J-tube placement 1/28 - Osmolite 1.5 at 10 ml/hr initiated; pt developed vomiting after feedings were started and anti-emetics were not effective. 1/29 - Feeds were re-attempted overnight.  1/30 - NGT placed to LWIS; 250 ml output; Osmolite 1.5 resumed at 20 ml/hr 1/31 - NGT in place to LWIS; 1000 ml output; Osmolite 1.5 at 20 ml/hr 2/1 - NGT in place to LWIS; 950 ml output; pt pulled out NGT later in evening; Osmolite 1.5 at 20 ml/hr 2/2 - NGT replaced to LWIS; 300 ml output; Osmolite 1.5 at 20 ml/hr 2/3 - NGT in place to LWIS; 100 ml/hr output per RN; Osmolite 1.5 on hold 2/2 pt to surgery; J-tube revision in OR with small bowel resection 2/4 - NGT in place to [facility]; per surgery do not use J-tube yet 2/5 - NGT d/c'd; trickle  TF of Osmolite 1.2 at 20 ml/hr initiated per surgery 2/6 - TF of Osmolite 1.2 at 20 ml/hr - RD with orders to increase feedings; RN reports tolerating well 2/7- RD re-consulted due to diarrhea. Change TF from Osmolite to Jevity.  2/9 - Continues with persistent diarrhea, at least 600 ml yesterday. Discussed with Dr. Grandville Silos re: changing to elemental formula, he is in agreement  RN reports that pt is tolerating TF well at this time except for diarrhea. Ordered for Jevity 1.2 at 60 ml/hr.  Potassium, magnesium and phosphorus are all WNL.   Height: Ht Readings from Last 1 Encounters:  07/06/13 5' 3"  (1.6 m)    Weight: Wt Readings from Last 1 Encounters:  07/08/13 172 lb 9.9 oz (78.3 kg)  Admit wt 152 lb; net +18 liters since admit  BMI:  Body mass index is 30.59 kg/(m^2).  Obese Class I  Estimated Nutritional Needs: Kcal: 1600 - 1800 Protein: 75 - 90 g Fluid: 1.8 - 2 liters  Skin: abdomen incision  Diet Order: NPO   EDUCATION NEEDS: -No education needs identified at this time   Intake/Output Summary (Last 24 hours) at 07/08/13 1049 Last data filed at 07/08/13 0500  Gross per 24 hour  Intake   3495 ml  Output   3500 ml  Net     -5 ml    Last BM: 2/8  Labs:   Recent Labs Lab 07/04/13 0440 07/05/13 0530 07/06/13 0310 07/07/13 0325 07/08/13 0430  NA 142 144 144 141 145  K 4.1 4.4 4.2 4.3 4.6  CL 107 108 109 106 111  CO2 25 25 26 25 27   BUN 5* 4* <3* <3* <3*  CREATININE 0.62 0.60 0.60 0.60 0.62  CALCIUM 7.8* 7.6* 7.4* 7.8* 7.9*  MG 1.7 1.7  --   --  1.9  PHOS  --  2.6  --   --  2.7  GLUCOSE 93 104* 100* 103* 102*    CBG (last 3)   Recent Labs  07/06/13 0745 07/07/13 0723 07/08/13 0757  GLUCAP 100* 107* 114*   Prealbumin  Date/Time Value Range Status  06/24/2013  4:30 PM 11.7* 17.0 - 34.0 mg/dL Final     Performed at [facility]    Scheduled Meds: . chlorhexidine  15 mL Mouth Rinse BID  . levETIRAcetam  1,000 mg Intravenous Q12H  .  metoprolol  5 mg Intravenous Q6H  . mirtazapine  30 mg Oral QHS  . pantoprazole (PROTONIX) IV  40 mg Intravenous Q12H  . paregoric  10 mL Per Tube Q6H  . risperiDONE  0.5 mg Oral BID  . sodium chloride  10 mL Intravenous Q12H  . valproate sodium  500 mg Intravenous Q8H    Continuous Infusions: . citrate dextrose    . dextrose 5 % and 0.45 % NaCl with KCl 20 mEq/L 125 mL/hr at 07/08/13 0547  . feeding supplement (JEVITY 1.2 CAL) 60 mL (07/07/13 1334)   Inda Coke MS, RD, LDN Pager: [phone] After-hours pager: [phone]

## 2013-07-08 NOTE — Progress Notes (Signed)
Immodium liquid has been ordered by the hospitalist team. Hopefully this will help. Will also change to more elemental TF - Vital. I D/W dietician. Patient examined and I agree with the assessment and plan  Violeta GelinasBurke Corrisa Gibby, MD, MPH, FACS Pager: [phone]  07/08/2013 10:51 AM

## 2013-07-08 NOTE — Progress Notes (Signed)
Patient ID: Joan PeelingLora [name], female   DOB: 01/19/1961, 53 y.o.   MRN: [id number] 6 Days Post-Op  Subjective: Pt more awake today, but babbles incoherently at times, other times makes sense.  She was able to tell me it was 2015 today, but thought she was at some clinic.  Objective: Vital signs in last 24 hours: Temp:  [98.1 F (36.7 C)-99.4 F (37.4 C)] 98.2 F (36.8 C) (02/09 0337) Pulse Rate:  [124-137] 128 (02/09 0337) Resp:  [12-20] 12 (02/09 0337) BP: (105-133)/(43-76) 130/73 mmHg (02/09 0337) SpO2:  [99 %-100 %] 100 % (02/09 0337) Weight:  [172 lb 9.9 oz (78.3 kg)] 172 lb 9.9 oz (78.3 kg) (02/09 0337) Last BM Date: 07/07/13  Intake/Output from previous day: 02/08 0701 - 02/09 0700 In: 4250 [I.V.:2505; NG/GT:1150; IV Piggyback:165] Out: 3500 [Urine:2900; Stool:600] Intake/Output this shift:    PE: Abd: soft, minimally tender, incision c/d/i with staples, +BS, J tube in place with tube feeds going.  flexi-seal in place with copious diarrhea  Lab Results:   Recent Labs  07/07/13 0325 07/08/13 0430  WBC 7.4 5.8  HGB 8.8* 8.0*  HCT 26.7* 24.9*  PLT 111* 97*   BMET  Recent Labs  07/07/13 0325 07/08/13 0430  NA 141 145  K 4.3 4.6  CL 106 111  CO2 25 27  GLUCOSE 103* 102*  BUN <3* <3*  CREATININE 0.60 0.62  CALCIUM 7.8* 7.9*   PT/INR No results found for this basename: LABPROT, INR,  in the last 72 hours CMP     Component Value Date/Time   NA 145 07/08/2013 0430   K 4.6 07/08/2013 0430   CL 111 07/08/2013 0430   CO2 27 07/08/2013 0430   GLUCOSE 102* 07/08/2013 0430   BUN <3* 07/08/2013 0430   CREATININE 0.62 07/08/2013 0430   CALCIUM 7.9* 07/08/2013 0430   PROT 4.1* 07/04/2013 1224   ALBUMIN 2.1* 07/04/2013 1224   AST 12 07/04/2013 1224   ALT 7 07/04/2013 1224   ALKPHOS 38* 07/04/2013 1224   BILITOT 0.4 07/04/2013 1224   GFRNONAA >90 07/08/2013 0430   GFRAA >90 07/08/2013 0430   Lipase     Component Value Date/Time   LIPASE 9* 06/30/2013 1300       Studies/Results: No  results found.  Anti-infectives: Anti-infectives   Start     Dose/Rate Route Frequency Ordered Stop   07/02/13 1330  ciprofloxacin (CIPRO) IVPB 400 mg     400 mg 200 mL/hr over 60 Minutes Intravenous To Surgery 07/02/13 1321 07/02/13 1326   06/23/13 1400  piperacillin-tazobactam (ZOSYN) IVPB 3.375 g  Status:  Discontinued     3.375 g 12.5 mL/hr over 240 Minutes Intravenous 3 times per day 06/23/13 1336 06/26/13 1441   06/20/13 1200  vancomycin (VANCOCIN) IVPB 1000 mg/200 mL premix     1,000 mg 200 mL/hr over 60 Minutes Intravenous  Once 06/20/13 1146 06/20/13 1433       Assessment/Plan  1. S/p j-tube revision, POD 6  Plan: 1. Patient has not had a swallow study since being here.  Right now the only thing we can give her through her j-tube to help with diarrhea is paregoric.  Everything else is a pill or powder that will clog her tube.  It may be helpful to get a swallow eval to see if the patient can swallow pills at least.  This gives us more ability to help control her diarrhea.  Her C. Diff is negative.  LOS: 19 days  Joan [name] E 07/08/2013, 8:27 AM Pager: [phone]

## 2013-07-08 NOTE — Progress Notes (Signed)
Report called and received from RN at 3S at 18:06: now awaiting pt.

## 2013-07-09 LAB — CULTURE, BLOOD (ROUTINE X 2)
Culture: NO GROWTH
Culture: NO GROWTH

## 2013-07-09 LAB — BASIC METABOLIC PANEL
CHLORIDE: 107 meq/L (ref 96–112)
CO2: 24 mEq/L (ref 19–32)
CREATININE: 0.61 mg/dL (ref 0.50–1.10)
Calcium: 7.7 mg/dL — ABNORMAL LOW (ref 8.4–10.5)
Glucose, Bld: 98 mg/dL (ref 70–99)
Potassium: 4.6 mEq/L (ref 3.7–5.3)
Sodium: 142 mEq/L (ref 137–147)

## 2013-07-09 LAB — CBC
HEMATOCRIT: 26.7 % — AB (ref 36.0–46.0)
Hemoglobin: 8.6 g/dL — ABNORMAL LOW (ref 12.0–15.0)
MCH: 31.9 pg (ref 26.0–34.0)
MCHC: 32.2 g/dL (ref 30.0–36.0)
MCV: 98.9 fL (ref 78.0–100.0)
Platelets: 106 10*3/uL — ABNORMAL LOW (ref 150–400)
RBC: 2.7 MIL/uL — ABNORMAL LOW (ref 3.87–5.11)
RDW: 17.3 % — AB (ref 11.5–15.5)
WBC: 8.1 10*3/uL (ref 4.0–10.5)

## 2013-07-09 LAB — GLUCOSE, CAPILLARY: Glucose-Capillary: 79 mg/dL (ref 70–99)

## 2013-07-09 MED ORDER — METOPROLOL TARTRATE 25 MG/10 ML ORAL SUSPENSION
25.0000 mg | Freq: Two times a day (BID) | ORAL | Status: DC
  Administered 2013-07-09 (×2): 25 mg
  Filled 2013-07-09 (×4): qty 10

## 2013-07-09 NOTE — Progress Notes (Signed)
Vital 1.2 rate dose changed to 4840ml/hr, will continue to increase every 4 hours until 60 ml/hr is achieved.

## 2013-07-09 NOTE — Progress Notes (Signed)
Pt had a thick BM that was not collected by the flexiseal, instead BM oozed out from around rectal tube. Called surgical PA to see if rectal tube could be removed. PA agreed it could be removed but to check with Hospitalist to see if it was okay. Dr. Blake DivineAkula stated to wait two hours to see if it occurred again. Pt didn't have a bowel moment but when reassessing, there was a small amount of liquid stool draining. Will ask night shift RN to reassess later tonight to see if rectal tube could be removed.will continue to monitor pt.

## 2013-07-09 NOTE — Progress Notes (Signed)
Patient ID: Candida PeelingLora Holway, female   DOB: 11/03/1960, 53 y.o.   MRN: [id number] 7 Days Post-Op  Subjective: Pt doing ok.  Objective: Vital signs in last 24 hours: Temp:  [98.7 F (37.1 C)-99.3 F (37.4 C)] 98.9 F (37.2 C) (02/10 0514) Pulse Rate:  [119-133] 133 (02/10 0514) Resp:  [14-21] 21 (02/09 1528) BP: (97-137)/(64-82) 137/82 mmHg (02/10 0514) SpO2:  [100 %] 100 % (02/10 0514) Weight:  [173 lb 15.1 oz (78.9 kg)] 173 lb 15.1 oz (78.9 kg) (02/10 0514) Last BM Date: 07/09/13 (pt has rectal tube )  Intake/Output from previous day: 02/09 0701 - 02/10 0700 In: 2035 [I.V.:1375; NG/GT:600] Out: 1750 [Urine:550; Stool:1200] Intake/Output this shift: Total I/O In: 40 [Other:40] Out: -   PE: Abd: soft, minimally tender, incision c/d/i.  flexi-seal with thicker and less output today  Lab Results:   Recent Labs  07/07/13 0325 07/08/13 0430  WBC 7.4 5.8  HGB 8.8* 8.0*  HCT 26.7* 24.9*  PLT 111* 97*   BMET  Recent Labs  07/07/13 0325 07/08/13 0430  NA 141 145  K 4.3 4.6  CL 106 111  CO2 25 27  GLUCOSE 103* 102*  BUN <3* <3*  CREATININE 0.60 0.62  CALCIUM 7.8* 7.9*   PT/INR No results found for this basename: LABPROT, INR,  in the last 72 hours CMP     Component Value Date/Time   NA 145 07/08/2013 0430   K 4.6 07/08/2013 0430   CL 111 07/08/2013 0430   CO2 27 07/08/2013 0430   GLUCOSE 102* 07/08/2013 0430   BUN <3* 07/08/2013 0430   CREATININE 0.62 07/08/2013 0430   CALCIUM 7.9* 07/08/2013 0430   PROT 4.1* 07/04/2013 1224   ALBUMIN 2.1* 07/04/2013 1224   AST 12 07/04/2013 1224   ALT 7 07/04/2013 1224   ALKPHOS 38* 07/04/2013 1224   BILITOT 0.4 07/04/2013 1224   GFRNONAA >90 07/08/2013 0430   GFRAA >90 07/08/2013 0430   Lipase     Component Value Date/Time   LIPASE 9* 06/30/2013 1300       Studies/Results: No results found.  Anti-infectives: Anti-infectives   Start     Dose/Rate Route Frequency Ordered Stop   07/02/13 1330  ciprofloxacin (CIPRO) IVPB 400 mg     400 mg 200  mL/hr over 60 Minutes Intravenous To Surgery 07/02/13 1321 07/02/13 1326   06/23/13 1400  piperacillin-tazobactam (ZOSYN) IVPB 3.375 g  Status:  Discontinued     3.375 g 12.5 mL/hr over 240 Minutes Intravenous 3 times per day 06/23/13 1336 06/26/13 1441   06/20/13 1200  vancomycin (VANCOCIN) IVPB 1000 mg/200 mL premix     1,000 mg 200 mL/hr over 60 Minutes Intravenous  Once 06/20/13 1146 06/20/13 1433       Assessment/Plan  1. S/p j-tube revision, POD 7 2. Diarrhea  Plan: 1. Spoke to nursing about importance of documenting output from flexi-seal as she is now on several anti-diarrheal agents and we do not want to constipate her. 2. Cont to increase TF to goal rate.    LOS: 20 days    Chanler Schreiter E 07/09/2013, 9:53 AM Pager: [phone]

## 2013-07-09 NOTE — Progress Notes (Signed)
[facility] PROGRESS NOTE  Candida PeelingLora Casten ZOX:[id number] DOB: 11/25/1960 DOA: 06/19/2013 PCP: Karlene EinsteinASANAYAKA,[name], MD  HPI/Subjective: 53 yr old female presents with a CC of Altered mental status. Pt has a PMH of peripheral neuropathy, seizures, catatonia, anxiety, HTN, and fibromyalgia. Pt was previously admitted on 12/13 thru 12/28 with toxic metabolic encephalopathy.  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  EEG showed no recurrent seizures. A 24 hour EEG  showed an intermittent delta pattern felt to represent Frontal Intermittent Rhythmic Delta Activity. Pt was started on two antiepileptics.  This admission, she was again found altered and seemed "comatose" . Since d/c in December, pt has had persistent confusion and [facility]. She is currently confused and experiencing visual hallucinations.She is s/p J tube placement and followed with intractable nausea, vomiting. Surgery following, . ON 2/3 night pt developed fever of 101.4, became tachycardic and hypotensive. CXR, done , UA ordered and blood cultures negative so far. Her work up so far negative for infection.   H&H remains stable at 8.5 , and she was found to have mild leukocytosis.  Subjective:  Not in any distress, denies any pain or sob on asking, coughing a little,. Not bringing up any sputum.   She is oriented to place .    Assessment/Plan:  Anemia Unclear etiology, s/p 2 unit s of prbc transfusion and started her on protonix . Her stool for occult blood negative in the last 2 weeks. Anemia panel showed severe iron deficiency . She might require EGD once more stable, GI consulted recommended outpatient follow up with her GI physician at [facility].   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  -Pt received diatech catheter placement and plasmapheresis on 06/20/13.   -Plan is for  plasmapheresis every other day and Solumedrol 500 mg bid x 3 days. D/C on 1/24. -MRI of the brain showed no acute abnormality.  Detailed results listed below. -Ammonia level is normal - CT chest and transvaginal ultrasound showed no abnormalities. -[facility] paraneoplastic panel reported to be negative, MRI of the C-spine is negative for acute findings - Continue with Ativan prn. Pending:   serum NMDA.   Tachycardia - Patient continues to have sinus tachycardia probably secondary to hypotension from volume depletion and fever.  - appears to be sinus tachy, will get 12 lead EKG .  -on po metoprolol.  - echocardiogram ordered for further evaluation and shows preserved LVEF , mild LVH. No wall motion abn.  Hx of Convulsions/Seizures  -Pt has a PMH of seizures and catatonia.   -MRI of the brain showed thalamic signal abnormality during previous admission.  No longer present on current MRI. -Pt currently taking Keppra and Depakene.  Increased Depakene to 500mg  tid -Valproic acid lvl 35.1 was low.  Repeat Depakote level is 76.6  Hypokalemia - Repleted  Nausea&Vomiting Started after tube feeds was initiated, which has resolved.  Continue zofran, phenergan prn She underwent revision of the j tube per surgery and small bowel resection on 2/3. Ng tube was taken out and she was started on very low rate tube feeds, slowly increasing as per surgery recommendations. She continues to have diarrhea, c diff pcr negative, rota virus positive.   Hx of Anxiety  -Continue pt on Remeron -Patient appears to have had a psychiatric history  -Current illness is bringing out psychiatric symptoms. -Psychiatry consulted as psychotropic medications may  help with her current symptoms. -Psych prescribed Risperdal .5 mg bid Ativan prn for anxiety.  Hypertension  -Pt has a PMH of HTN  -Likely secondary to anxiety  Continue metoprolol and prn hydralazine.   Malnutrition with history of Gastric Bypass -Per SNF  patient has had no PO intake for over 1 week prior to admission. -SNF sent her to hospital for J tube placement. -Gen Surg consulted, J-tube placed and underwent revision of j tube withexploratory laparotomy on 2/3 and small bowel resection.   Phosphate deficiency - K phos IV  X 1  - repleted.   Recent hx of c-diff (05/18/13)  -Pt negative for C-diff at this admit  Thrombocytopenia -Discontinue heparin, on SCD'S  Enterovaginal fistula -Reported prior to that this patient had some stools in her vagina. -Discussed with radiology, no evidence of rectovaginal fistula and the previous imaging, (patient had indicated pelvic scan done previously). - Consulted GI, and the did not feel that patient will benefit from colonoscopy for the diagnosis of fistula. - Called and discussed with Gyn on-call, and they recommend to follow the patient as outpatient. No further workup required in the hospital.  Fever:  Unclear etiology resolved. CXR negative for pneumonia, right lower lobe atelectasis. UA negative for infection, skin intact. Blood cultures done and pending. Venous dopplers negative for DVT. [name]   DVT Prophylaxis:  SCD';s  Code Status: Full Code  Disposition Plan: D/C to SNF when medically appropriate.   Consultants:  Surgery  Neuro  Procedures:  Plasmapheresis  PICC Line Placement  Antibiotics:  None  Family communication None at bedside.  Called son on 2/10 and left a message.   Objective: Filed Vitals:   07/08/13 1929 07/09/13 0514 07/09/13 1428 07/09/13 1530  BP: 97/65 137/82 90/64 90/65   Pulse: 119 133 124 128  Temp: 99.3 F (37.4 C) 98.9 F (37.2 C)    TempSrc: Oral Oral    Resp:   20   Height:      Weight:  78.9 kg (173 lb 15.1 oz)    SpO2: 100% 100% 97%     Intake/Output Summary (Last 24 hours) at 07/09/13 1855 Last data filed at 07/09/13 1800  Gross per 24 hour  Intake   3970 ml  Output      0 ml  Net   3970 ml   Filed Weights   07/07/13 0309  07/08/13 0337 07/09/13 0514  Weight: 77.5 kg (170 lb 13.7 oz) 78.3 kg (172 lb 9.9 oz) 78.9 kg (173 lb 15.1 oz)    Exam: Physical Exam: wakes up on verbal cues,  no distress.  Eyes: No signs of jaundice, pale conjunctiva,  EOMI Nose: NG tube in place Throat: Oropharynx nonerythematous, no exudate appreciated.  Neck: supple,No deformities, masses, or tenderness noted. Chest wall tender to palpation. Lungs: Normal respiratory effort. Scattered rhonchi bilaterally.  Heart: tachycardic regular. S1 and S2 normal  Abdomen: BS normoactive. Soft, Nondistended, non-tender.  Extremities: 1+ pedal edema, extremities tender to touch, no erythema.       Data Reviewed: Basic Metabolic Panel:  Recent Labs Lab 07/04/13 0440 07/05/13 0530 07/06/13 0310 07/07/13 0325 07/08/13 0430 07/09/13 1055  NA 142 144 144 141 145 142  K 4.1 4.4 4.2 4.3 4.6 4.6  CL 107 108 109 106 111 107  CO2 25 25 26 25 27 24   GLUCOSE 93 104* 100* 103* 102* 98  BUN 5* 4* <3* <3* <3* <3*  CREATININE 0.62 0.60 0.60 0.60 0.62 0.61  CALCIUM 7.8*  7.6* 7.4* 7.8* 7.9* 7.7*  MG 1.7 1.7  --   --  1.9  --   PHOS  --  2.6  --   --  2.7  --    Liver Function Tests:  Recent Labs Lab 07/04/13 1224  AST 12  ALT 7  ALKPHOS 38*  BILITOT 0.4  PROT 4.1*  ALBUMIN 2.1*   No results found for this basename: AMMONIA,  in the last 168 hours CBC:  Recent Labs Lab 07/05/13 0530 07/06/13 0310 07/07/13 0325 07/08/13 0430 07/09/13 1055  WBC 7.8 5.5 7.4 5.8 8.1  HGB 8.3* 8.0* 8.8* 8.0* 8.6*  HCT 25.0* 24.3* 26.7* 24.9* 26.7*  MCV 96.2 97.2 96.7 97.6 98.9  PLT 89* 81* 111* 97* 106*   CBG:  Recent Labs Lab 07/05/13 0726 07/06/13 0745 07/07/13 0723 07/08/13 0757 07/09/13 0751  GLUCAP 109* 100* 107* 114* 79    Recent Results (from the past 240 hour(s))  CULTURE, BLOOD (ROUTINE X 2)     Status: None   Collection Time    07/03/13  6:40 AM      Result Value Range Status   Specimen Description BLOOD LEFT HAND    Final   Special Requests BOTTLES DRAWN AEROBIC ONLY 2CC   Final   Culture  Setup Time     Final   Value: 07/03/2013 13:19     Performed at [facility]   Culture     Final   Value: NO GROWTH 5 DAYS     Performed at [facility]   Report Status 07/09/2013 FINAL   Final  CULTURE, BLOOD (ROUTINE X 2)     Status: None   Collection Time    07/03/13  6:55 AM      Result Value Range Status   Specimen Description BLOOD LEFT ARM   Final   Special Requests BOTTLES DRAWN AEROBIC ONLY 1CC   Final   Culture  Setup Time     Final   Value: 07/03/2013 13:19     Performed at [facility]   Culture     Final   Value: NO GROWTH 5 DAYS     Performed at [facility]   Report Status 07/09/2013 FINAL   Final  CLOSTRIDIUM DIFFICILE BY PCR     Status: None   Collection Time    07/06/13 10:27 AM      Result Value Range Status   C difficile by pcr NEGATIVE  NEGATIVE Final     Studies: No results found.  Scheduled Meds: . chlorhexidine  15 mL Mouth Rinse BID  . levETIRAcetam  1,000 mg Intravenous Q12H  . metoprolol tartrate  25 mg Per Tube BID  . mirtazapine  30 mg Oral QHS  . pantoprazole (PROTONIX) IV  40 mg Intravenous Q12H  . paregoric  10 mL Per Tube Q6H  . risperiDONE  0.5 mg Oral BID  . sodium chloride  10 mL Intravenous Q12H  . valproate sodium  500 mg Intravenous Q8H   Continuous Infusions: . citrate dextrose    . dextrose 5 % and 0.45 % NaCl with KCl 20 mEq/L 125 mL/hr at 07/09/13 1549  . feeding supplement (VITAL AF 1.2 CAL) 1,000 mL (07/09/13 1800)    Principal Problem:   Acute encephalopathy Active Problems:   Convulsions/seizures   Toxic metabolic encephalopathy   Psychosis   Nausea with vomiting   Malfunction of jejunostomy tube   S/P small bowel resection    Joan [name]  [facility] Pager [phone]. If 7PM-7AM, please contact night-coverage at [email or web address], password [facility] 07/09/2013, 6:55 PM  LOS: 20 days

## 2013-07-09 NOTE — Progress Notes (Signed)
May remove flexiseal. Will watch output. ABD soft. Patient examined and I agree with the assessment and plan  Violeta GelinasBurke Tristin Vandeusen, MD, MPH, FACS Pager: [phone]  07/09/2013 1:46 PM

## 2013-07-09 NOTE — Clinical Social Work Note (Signed)
CSW updated facility on patient's condition and to update about possible DC in the coming days.  Roddie McBryant Kati Riggenbach, [place] Lake[place]CSWA, [place], [phone]

## 2013-07-10 ENCOUNTER — Inpatient Hospital Stay (HOSPITAL_COMMUNITY): Payer: Medicare Other

## 2013-07-10 ENCOUNTER — Encounter (HOSPITAL_COMMUNITY): Payer: Self-pay | Admitting: Radiology

## 2013-07-10 DIAGNOSIS — G92 Toxic encephalopathy: Secondary | ICD-10-CM

## 2013-07-10 DIAGNOSIS — A419 Sepsis, unspecified organism: Secondary | ICD-10-CM

## 2013-07-10 DIAGNOSIS — R509 Fever, unspecified: Secondary | ICD-10-CM

## 2013-07-10 DIAGNOSIS — G929 Unspecified toxic encephalopathy: Secondary | ICD-10-CM

## 2013-07-10 DIAGNOSIS — R6521 Severe sepsis with septic shock: Secondary | ICD-10-CM

## 2013-07-10 LAB — GLUCOSE, CAPILLARY
GLUCOSE-CAPILLARY: 81 mg/dL (ref 70–99)
Glucose-Capillary: 93 mg/dL (ref 70–99)
Glucose-Capillary: 81 mg/dL (ref 70–99)

## 2013-07-10 LAB — URINALYSIS, ROUTINE W REFLEX MICROSCOPIC
Bilirubin Urine: NEGATIVE
GLUCOSE, UA: NEGATIVE mg/dL
Hgb urine dipstick: NEGATIVE
KETONES UR: NEGATIVE mg/dL
Nitrite: NEGATIVE
PROTEIN: NEGATIVE mg/dL
Specific Gravity, Urine: 1.018 (ref 1.005–1.030)
UROBILINOGEN UA: 1 mg/dL (ref 0.0–1.0)
pH: 5 (ref 5.0–8.0)

## 2013-07-10 LAB — LACTIC ACID, PLASMA
LACTIC ACID, VENOUS: 1.7 mmol/L (ref 0.5–2.2)
Lactic Acid, Venous: 2.3 mmol/L — ABNORMAL HIGH (ref 0.5–2.2)

## 2013-07-10 LAB — BASIC METABOLIC PANEL
BUN: 4 mg/dL — ABNORMAL LOW (ref 6–23)
CO2: 22 mEq/L (ref 19–32)
Calcium: 7.5 mg/dL — ABNORMAL LOW (ref 8.4–10.5)
Chloride: 101 mEq/L (ref 96–112)
Creatinine, Ser: 0.55 mg/dL (ref 0.50–1.10)
Glucose, Bld: 103 mg/dL — ABNORMAL HIGH (ref 70–99)
Potassium: 4.4 mEq/L (ref 3.7–5.3)
SODIUM: 137 meq/L (ref 137–147)

## 2013-07-10 LAB — CBC
HCT: 25.8 % — ABNORMAL LOW (ref 36.0–46.0)
Hemoglobin: 8.4 g/dL — ABNORMAL LOW (ref 12.0–15.0)
MCH: 31.8 pg (ref 26.0–34.0)
MCHC: 32.6 g/dL (ref 30.0–36.0)
MCV: 97.7 fL (ref 78.0–100.0)
PLATELETS: 93 10*3/uL — AB (ref 150–400)
RBC: 2.64 MIL/uL — AB (ref 3.87–5.11)
RDW: 17 % — ABNORMAL HIGH (ref 11.5–15.5)
WBC: 4.9 10*3/uL (ref 4.0–10.5)

## 2013-07-10 LAB — URINE MICROSCOPIC-ADD ON

## 2013-07-10 LAB — VALPROIC ACID LEVEL: VALPROIC ACID LVL: 62.5 ug/mL (ref 50.0–100.0)

## 2013-07-10 LAB — CLOSTRIDIUM DIFFICILE BY PCR: Toxigenic C. Difficile by PCR: NEGATIVE

## 2013-07-10 LAB — PROCALCITONIN: PROCALCITONIN: 2.97 ng/mL

## 2013-07-10 LAB — MRSA PCR SCREENING: MRSA by PCR: NEGATIVE

## 2013-07-10 MED ORDER — SODIUM CHLORIDE 0.9 % IV BOLUS (SEPSIS)
1000.0000 mL | Freq: Once | INTRAVENOUS | Status: AC
  Administered 2013-07-10: 1000 mL via INTRAVENOUS

## 2013-07-10 MED ORDER — VANCOMYCIN HCL IN DEXTROSE 750-5 MG/150ML-% IV SOLN
750.0000 mg | Freq: Three times a day (TID) | INTRAVENOUS | Status: DC
  Administered 2013-07-10 – 2013-07-11 (×2): 750 mg via INTRAVENOUS
  Filled 2013-07-10 (×4): qty 150

## 2013-07-10 MED ORDER — VANCOMYCIN HCL IN DEXTROSE 1-5 GM/200ML-% IV SOLN
1000.0000 mg | Freq: Once | INTRAVENOUS | Status: AC
  Administered 2013-07-10: 1000 mg via INTRAVENOUS
  Filled 2013-07-10: qty 200

## 2013-07-10 MED ORDER — DEXTROSE 5 % IV SOLN
1.0000 g | Freq: Three times a day (TID) | INTRAVENOUS | Status: DC
  Administered 2013-07-10 – 2013-07-13 (×9): 1 g via INTRAVENOUS
  Filled 2013-07-10 (×12): qty 1

## 2013-07-10 MED ORDER — IOHEXOL 300 MG/ML  SOLN
25.0000 mL | INTRAMUSCULAR | Status: AC
  Administered 2013-07-10 (×2): 25 mL via ORAL

## 2013-07-10 MED ORDER — PHENYLEPHRINE HCL 10 MG/ML IJ SOLN
30.0000 ug/min | INTRAVENOUS | Status: DC
  Administered 2013-07-10: 40 ug/min via INTRAVENOUS
  Administered 2013-07-10 (×2): 50 ug/min via INTRAVENOUS
  Administered 2013-07-10: 30 ug/min via INTRAVENOUS
  Filled 2013-07-10 (×2): qty 1

## 2013-07-10 MED ORDER — SODIUM CHLORIDE 0.9 % IV SOLN
INTRAVENOUS | Status: DC
  Administered 2013-07-10: 10:00:00 via INTRAVENOUS
  Administered 2013-07-10 – 2013-07-11 (×2): 125 mL/h via INTRAVENOUS
  Administered 2013-07-11: 20 mL/h via INTRAVENOUS

## 2013-07-10 MED ORDER — HEPARIN SODIUM (PORCINE) 5000 UNIT/ML IJ SOLN
5000.0000 [IU] | Freq: Three times a day (TID) | INTRAMUSCULAR | Status: DC
  Administered 2013-07-10 – 2013-07-11 (×3): 5000 [IU] via SUBCUTANEOUS
  Filled 2013-07-10 (×6): qty 1

## 2013-07-10 MED ORDER — PHENYLEPHRINE HCL 10 MG/ML IJ SOLN
30.0000 ug/min | INTRAVENOUS | Status: DC
  Administered 2013-07-10: 75 ug/min via INTRAVENOUS
  Filled 2013-07-10 (×3): qty 4

## 2013-07-10 MED ORDER — SODIUM CHLORIDE 0.9 % IV BOLUS (SEPSIS)
1000.0000 mL | INTRAVENOUS | Status: DC | PRN
  Administered 2013-07-10: 1000 mL via INTRAVENOUS
  Administered 2013-07-12: 07:00:00 via INTRAVENOUS

## 2013-07-10 NOTE — Progress Notes (Signed)
BP @0635  was 62/37 rechecked manually @ 0641 90/60. MD Notified. Told to recheck in 30 minutes and if less than 80 to page Attending MD.

## 2013-07-10 NOTE — Progress Notes (Signed)
Gave report to RN Pam in 2 Heart. All questions answered.  Peter Congoiane Ajanee Buren RN

## 2013-07-10 NOTE — Consult Note (Signed)
Name: Joan [name] MRN: [id number] DOB: 13-Oct-1960    ADMISSION DATE:  06/19/2013 CONSULTATION DATE:  07/10/2013  REFERRING MD :  [facility] PRIMARY SERVICE: [facility]  CHIEF COMPLAINT:  Hypotension  BRIEF PATIENT DESCRIPTION: 53 year old female with neuro and psych history presented 1/21 with AMS. PCCM asked to see for hypotension on 2/11.   PMH of peripheral neuropathy, seizures, catatonia, anxiety, HTN, and fibromyalgia.  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 No evidence of malignancy or other significant abnormality of the chest 1/24 - MRI C-spine > No evidence of cervical disc herniation. Minimal degenerative changes upper thoracic spine 1/27 - J tube placed 2/3 - Small bowel resection and J-tube revision. 2/11 - Abd xray > Persistent ileus  LINES / TUBES: R Permacath 1/22 >>> R PICC 1/20 >>> Jtube 1/27 >>> Flexiseal  CULTURES: C-Diff 1/21 > Neg Stool 1/21 > Neg C-diff 1/25 > Neg Urine 1/25 > Neg ______________ ______________  Blood 2/4 > Neg C-Diff 2/7 > Neg Blood 2/11 >>>ng Urine 2/11 >>> C-diff 2/11 >>>neg Blood 1/25 > Neg   ANTIBIOTICS: None  HISTORY OF PRESENT ILLNESS:    53 yr old female presented with Altered mental status(1/29). Pt has a PMH of peripheral neuropathy, seizures, catatonia, anxiety, HTN, and fibromyalgia. Pt was previously admitted on 12/13 thru 12/28 with toxic metabolic encephalopathy. 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 This admission, she was again found altered at SNF and seemed "comatose" . Since d/c in December, pt has had persistent confusion and is confined to a wheelchair. At time of admission she currently confused and experiencing visual hallucinations. She was admitted for altered mental status.  Neurology thought that she was possibly in NCSE for a few days prior to admission, but they  could not rule out autoimmune encephalitis so they began empirically treating with steroids and PLEX.  1/27 has had some improvement, J-tube placed, which required revision on 2/3 with a small bowel resection. Pt developed fever of 101.4, became tachycardic and hypotensive. She also had anemia requiring 2 unites PRBC. CXR, done, UA ordered and blood cultures negative so far. PLEX completed 2/4. She began having some GI diarrhea and vomitting and on 2/11 was found to by hypotensive. J tube feedings have been stopped. PCCM asked to see.   PAST MEDICAL HISTORY :  Past Medical History  Diagnosis Date  . Peripheral neuropathy   . Fibromyalgia   . Hypertension   . Anxiety   . Catatonia   . Seizures    Past Surgical History  Procedure Laterality Date  . Gastric bypass    . Abdominal hysterectomy    . Jejunostomy N/A 06/25/2013    Procedure:  OPEN JEJUNOSTOMY FEEDING TUBE ;  Surgeon: Cherylynn Ridges, MD;  Location: [facility] OR;  Service: General;  Laterality: N/A;  . Laparotomy N/A 07/02/2013    Procedure: EXPLORATORY LAPAROTOMY with revision of jejunostomy feeding tube.;  Surgeon: Cherylynn Ridges, MD;  Location: [facility] OR;  Service: General;  Laterality: N/A;  . Bowel resection N/A 07/02/2013    Procedure: SMALL BOWEL RESECTION;  Surgeon: Cherylynn Ridges, MD;  Location: [facility] OR;  Service: General;  Laterality: N/A;   Prior to Admission medications   Medication Sig Start Date End Date Taking? Authorizing Provider  acetaminophen (TYLENOL) 325 MG tablet Take 650 mg by mouth every 4 (four) hours as  needed for mild pain or fever.   Yes Historical Provider, MD  atenolol (TENORMIN) 25 MG tablet Take 12.5 mg by mouth daily.    Yes Historical Provider, MD  chlorhexidine (PERIDEX) 0.12 % solution Use as directed 15 mLs in the mouth or throat 2 (two) times daily.   Yes Historical Provider, MD  feeding supplement, RESOURCE BREEZE, (RESOURCE BREEZE) LIQD Take 1 Container by mouth 2 (two) times daily between meals.   Yes Historical  Provider, MD  folic acid (FOLVITE) 1 MG tablet Take 1 mg by mouth daily.   Yes Historical Provider, MD  levETIRAcetam (KEPPRA) 1000 MG tablet Take 1,000 mg by mouth 2 (two) times daily.   Yes Historical Provider, MD  loperamide (IMODIUM) 2 MG capsule Take 2 mg by mouth as needed for diarrhea or loose stools.   Yes Historical Provider, MD  megestrol (MEGACE) 400 MG/10ML suspension Take 400 mg by mouth daily.   Yes Historical Provider, MD  methocarbamol (ROBAXIN) 500 MG tablet Take 500 mg by mouth every 6 (six) hours as needed for muscle spasms.   Yes Historical Provider, MD  mirtazapine (REMERON SOL-TAB) 30 MG disintegrating tablet Take 30 mg by mouth at bedtime.   Yes Historical Provider, MD  Multiple Vitamins-Minerals (ELDERTONIC PO) Take 15 mLs by mouth 2 (two) times daily.   Yes Historical Provider, MD  Nutritional Supplements (NUTRITIONAL SHAKE) LIQD Take 1 Can by mouth 3 (three) times daily before meals. Mighty shakes.   Yes Historical Provider, MD  thiamine (VITAMIN B-1) 100 MG tablet Take 100 mg by mouth daily.   Yes Historical Provider, MD  valproic acid (DEPAKENE) 250 MG capsule Take 250-500 mg by mouth 3 (three) times daily. Take 500mg  in the morning, 250mg  in the afternoon, and 500mg  at bedtime.   Yes Historical Provider, MD   Allergies  Allergen Reactions  . Morphine And Related Other (See Comments)    REACTION: Causes pain  . Paroxetine Hcl Other (See Comments)    REACTION:  unknown  . Penicillins Other (See Comments)    REACTION: Makes her body hurt.  . Sertraline Hcl Other (See Comments)    REACTION:  unknown    FAMILY HISTORY:  Family History  Problem Relation Age of Onset  . Hypertension Mother   . Diabetes Father    SOCIAL HISTORY:  reports that she has never smoked. She does not have any smokeless tobacco history on file. She reports that she does not drink alcohol. Her drug history is not on file.  REVIEW OF SYSTEMS:  Unable due to chronic  encephalopathy  SUBJECTIVE:   VITAL SIGNS: Temp:  [97.9 F (36.6 C)-99.5 F (37.5 C)] 97.9 F (36.6 C) (02/11 0641) Pulse Rate:  [124-137] 135 (02/11 0815) Resp:  [16-20] 18 (02/11 0815) BP: (64-110)/(47-79) 84/65 mmHg (02/11 0815) SpO2:  [97 %-100 %] 97 % (02/11 0641) Weight:  [79.652 kg (175 lb 9.6 oz)] 79.652 kg (175 lb 9.6 oz) (02/11 0700) HEMODYNAMICS:   VENTILATOR SETTINGS:   INTAKE / OUTPUT: Intake/Output     02/10 0701 - 02/11 0700 02/11 0701 - 02/12 0700   I.V. (mL/kg) 3135 (39.4)    Other 140    NG/GT     IV Piggyback 715    Total Intake(mL/kg) 3990 (50.1)    Urine (mL/kg/hr)     Stool     Total Output       Net +3990          Urine Occurrence 4 x  Stool Occurrence 1 x      PHYSICAL EXAMINATION: General:  Female of normal body habitus in NAD Neuro:  Alert, oriented to person and year. Confusion with word mimicking. Thought to be near baseline.  HEENT:  [place]/AT, dry MM Cardiovascular:  Tachy Lungs:  No wheeze Abdomen:  Soft, non-tender, non-distended. J-Tube Musculoskeletal:  Weakness x4 extremities, seems symmetric. Thought to be at/near baseline Skin:  Intact, flexiseal in place  LABS:  CBC  Recent Labs Lab 07/08/13 0430 07/09/13 1055 07/10/13 0540  WBC 5.8 8.1 4.9  HGB 8.0* 8.6* 8.4*  HCT 24.9* 26.7* 25.8*  PLT 97* 106* 93*   Coag's No results found for this basename: APTT, INR,  in the last 168 hours BMET  Recent Labs Lab 07/08/13 0430 07/09/13 1055 07/10/13 0540  NA 145 142 137  K 4.6 4.6 4.4  CL 111 107 101  CO2 27 24 22   BUN <3* <3* 4*  CREATININE 0.62 0.61 0.55  GLUCOSE 102* 98 103*   Electrolytes  Recent Labs Lab 07/04/13 0440 07/05/13 0530  07/08/13 0430 07/09/13 1055 07/10/13 0540  CALCIUM 7.8* 7.6*  < > 7.9* 7.7* 7.5*  MG 1.7 1.7  --  1.9  --   --   PHOS  --  2.6  --  2.7  --   --   < > = values in this interval not displayed. Sepsis Markers No results found for this basename: LATICACIDVEN, PROCALCITON,  O2SATVEN,  in the last 168 hours ABG No results found for this basename: PHART, PCO2ART, PO2ART,  in the last 168 hours Liver Enzymes  Recent Labs Lab 07/04/13 1224  AST 12  ALT 7  ALKPHOS 38*  BILITOT 0.4  ALBUMIN 2.1*   Cardiac Enzymes No results found for this basename: TROPONINI, PROBNP,  in the last 168 hours Glucose  Recent Labs Lab 07/05/13 0726 07/06/13 0745 07/07/13 0723 07/08/13 0757 07/09/13 0751 07/10/13 0810  GLUCAP 109* 100* 107* 114* 79 93    Imaging Dg Abd Portable 1v  07/10/2013   CLINICAL DATA:  Assess for ileus  EXAM: PORTABLE ABDOMEN - 1 VIEW  COMPARISON:  June 30, 2013  FINDINGS: There is persistent air-filled dilated small bowel loops in the left abdomen. Air is noted in the colon. Skin staples are projected over the right abdomen. Patient is status post prior cholecystectomy.  IMPRESSION: Persistent ileus.   Electronically Signed   By: Sherian ReinWei-Chen  Lin M.D.   On: 07/10/2013 02:30    CXR: 2/11 no acute pulmonary process  ASSESSMENT / PLAN:  PULMONARY A: ? Aspiration PNA P:   Supplemental O2 in setting of hypotension F/u CXR in AM Supportive Care  CARDIOVASCULAR A:  Septic shock - has been responsive to fluids. Likely hypovolemia, probable infection, lactate 2.3 reassuring Tachycardia - persistent over entire admission  P:  Transfer to SDU IVF resuscitation CVP monitoring Hold antihypertensives Rpt lactate, use pressors if higher  RENAL A:   No Acute Issue  P:   Follow BMP Supportive Care  GASTROINTESTINAL A:   Malnutrition Hx gastric bypass Vomitting/Diarrhea - Cdiff neg P:   Hold tube feeds for now as abd xray shows ileus.  Zofran PRN Flexiseal   HEMATOLOGIC A:   Anemia Thrombocytopenia  P:  Follow CBC Transfuse PRBC for H/H < 8/24  INFECTIOUS A:  ? Sepsis possible sources include PICC, aspiration, Permacath, J-tube surgical site.  P:   Trend PCT F/u cultures Monitor WBC and fever curve Start empiric  aztreonam/ vanc  Consider CT abdomen/pelvis since no cause apparent  ENDOCRINE A:   No Acute Issue  P:   Supportive Care  NEUROLOGIC A:   Encephalopathy Thought to be autoimmune with antibodies pending per neurology, who signed off 2/3 Hx seizures  P:   Cont Keppra and Depakote Check valproic acid level Had 5 PLEX treatments, now completed.   TODAY'S SUMMARY: severe sepsis with lactate 2.3, but hypotensive again after fluid bolus Start empiric abx -sources -lines vs abdomen Use pressors if rpt lactate rising    Joneen Roach, ACNP  Pulmonology/Critical Care Pager [phone] or [phone]  I have personally obtained a history, examined the patient, evaluated laboratory and imaging results, formulated the assessment and plan and placed orders.  CRITICAL CARE: The patient is critically ill with multiple organ systems failure and requires high complexity decision making for assessment and support, frequent evaluation and titration of therapies, application of advanced monitoring technologies and extensive interpretation of multiple databases. Critical Care Time devoted to patient care services described in this note is 60 minutes.  Pearlee Arvizu V.  [phone]   ,

## 2013-07-10 NOTE — Clinical Social Work Note (Signed)
CSW updated facility on patient's condition and notified them that patient has transferred to ICU. CSW will continue to update facility and assist with DC when appropriate.  Roddie McBryant Erinn Mendosa, [place]. James[place]CSWA, [place], [phone]

## 2013-07-10 NOTE — Progress Notes (Signed)
NUTRITION FOLLOW-UP  INTERVENTION: Once ready to resume feedings, recommend initiation of Vital AF 1.2 at 30 ml/hr via J-tube and advance q 4 hours to goal rate of 60 ml/hr. Goal rate will provide: 1728 kcal, 108 grams protein, 1168 ml free water.  RD to continue to follow nutrition care plan.  NUTRITION DIAGNOSIS: Inadequate oral intake related to AMS as evidenced by limited oral intake.  Ongoing.  Goal: Intake to meet >90% of estimated nutrition needs. Unmet.  Monitor:  weight trends, lab trends, I/O's, TF tolerance and adequacy  ASSESSMENT: PMHx significant for catatonia, HTN, anxiety, gastric bypass. From SNF.  Recent admission from 12/13 - 12/28 with toxic metabolic encephalopathy, hospital course was complicated with pyelonephritis and C. Difficile colitis. Pt was scheduled for PEG on 1/20, was found to be agitated and confused and was sent to ED.   Neuro saw pt on 1/21 and recommended plasma exchanges every other day as well as suppressive steroids to reduce her seizure foci.  Pt developed frequent amount of large stooling from vagina on 1/25 and was sent to SDU. Transferred back to Med-Surge floor on 2/9.  1/27 - Underwent open J-tube placement 1/28 - Osmolite 1.5 at 10 ml/hr initiated; pt developed vomiting after feedings were started and anti-emetics were not effective. 1/29 - Feeds were re-attempted overnight.  1/30 - NGT placed to LWIS; 250 ml output; Osmolite 1.5 resumed at 20 ml/hr 1/31 - NGT in place to LWIS; 1000 ml output; Osmolite 1.5 at 20 ml/hr 2/1 - NGT in place to LWIS; 950 ml output; pt pulled out NGT later in evening; Osmolite 1.5 at 20 ml/hr 2/2 - NGT replaced to LWIS; 300 ml output; Osmolite 1.5 at 20 ml/hr 2/3 - NGT in place to LWIS; 100 ml/hr output per RN; Osmolite 1.5 on hold 2/2 pt to surgery; J-tube revision in OR with small bowel resection 2/4 - [facility]; per surgery do not use J-tube yet 2/5 - NGT d/c'd; trickle TF of Osmolite 1.2 at 20  ml/hr initiated per surgery 2/6 - TF of Osmolite 1.2 at 20 ml/hr - RD with orders to increase feedings; RN reports tolerating well 2/7- RD re-consulted due to diarrhea. Change TF from Osmolite to Jevity.  2/9 - Continues with persistent diarrhea, at least 600 ml/day. Discussed with Dr. Janee Mornhompson re: changing to elemental formula, he is in agreement.  Change TF from Jevity to Vital AF 1.2. 2/10 - Diarrhea improved. C-diff negative; rotavirus positive. Pt achieved goal rate of Vital AF 1.2 at 60 ml/hr. 2/11 - Pt developed vomiting. TF held. Pt febrile. Abdominal film with mild ileus pattern. Pt with possible UTI. Planning to transfer to SDU.  Potassium, magnesium and phosphorus are all WNL.   Height: Ht Readings from Last 1 Encounters:  07/06/13 5\' 3"  (1.6 m)    Weight: Wt Readings from Last 1 Encounters:  07/10/13 175 lb 9.6 oz (79.652 kg)  Admit wt 152 lb; net +23 liters since admit  BMI:  Body mass index is 31.11 kg/(m^2).  Obese Class I  Estimated Nutritional Needs: Kcal: 1600 - 1800 Protein: 75 - 90 g Fluid: 1.8 - 2 liters  Skin: abdomen incision  Diet Order: NPO   EDUCATION NEEDS: -No education needs identified at this time   Intake/Output Summary (Last 24 hours) at 07/10/13 1041 Last data filed at 07/10/13 0930  Gross per 24 hour  Intake   3950 ml  Output   1200 ml  Net   2750 ml  Last BM: 700 ml via flexiseal so far today  Labs:   Recent Labs Lab 07/04/13 0440 07/05/13 0530  07/08/13 0430 07/09/13 1055 07/10/13 0540  NA 142 144  < > 145 142 137  K 4.1 4.4  < > 4.6 4.6 4.4  CL 107 108  < > 111 107 101  CO2 25 25  < > 27 24 22   BUN 5* 4*  < > <3* <3* 4*  CREATININE 0.62 0.60  < > 0.62 0.61 0.55  CALCIUM 7.8* 7.6*  < > 7.9* 7.7* 7.5*  MG 1.7 1.7  --  1.9  --   --   PHOS  --  2.6  --  2.7  --   --   GLUCOSE 93 104*  < > 102* 98 103*  < > = values in this interval not displayed.  CBG (last 3)   Recent Labs  07/08/13 0757 07/09/13 0751  07/10/13 0810  GLUCAP 114* 79 93   Prealbumin  Date/Time Value Ref Range Status  06/24/2013  4:30 PM 11.7* 17.0 - 34.0 mg/dL Final     Performed at [facility]    Scheduled Meds: . chlorhexidine  15 mL Mouth Rinse BID  . levETIRAcetam  1,000 mg Intravenous Q12H  . mirtazapine  30 mg Oral QHS  . pantoprazole (PROTONIX) IV  40 mg Intravenous Q12H  . paregoric  10 mL Per Tube Q6H  . risperiDONE  0.5 mg Oral BID  . sodium chloride  1,000 mL Intravenous Once  . sodium chloride  10 mL Intravenous Q12H  . valproate sodium  500 mg Intravenous 3 times per day    Continuous Infusions: . sodium chloride 125 mL/hr at 07/10/13 1017  . citrate dextrose     Jarold Motto MS, RD, LDN Pager: [phone] After-hours pager: [phone]

## 2013-07-10 NOTE — Progress Notes (Signed)
Pt vomiting. 4 mg Zofran administered and MD paged. MD ordered to hold tube feedings and Portable Abdominal 1 view.

## 2013-07-10 NOTE — Significant Event (Signed)
Rapid Response Event Note  Overview: Time Called: 0740 Arrival Time: 0743 Event Type: Hypotension  Initial Focused Assessment: Patient at baseline neuro status, responsive, occ interactive Per RN BP 66/47 manual Manual BP 88/62  ST 130s  RR 18  O2 sat 98 on RA  Rectal temp 101.5  Interventions: 1L NS bolus 12 lead EXG done PCXR done Tylenol PR Urine culture sent, I&O cath Stool sample collected  0840 post 1 L BP 100/65 2nd L NS given 1000 BP 107/70 1030 BP 75/50  ( 15 min post 2nd NS bolus) 3rd NS bolus infusing  BP 80-85/60 MD at bedside,  Will transfer to ICU for vasopressors  Event Summary: Name of Physician Notified: dr Fuller MandrilGemire at bedside at    Name of Consulting Physician Notified: dr Craige CottaSood at [phone][phone]        Joan [name]

## 2013-07-10 NOTE — Progress Notes (Addendum)
8 Days Post-Op  Subjective: rapin response RN in the room. Patient has fever and decreased BP this AM.  Objective: Vital signs in last 24 hours: Temp:  [97.9 F (36.6 C)-99.5 F (37.5 C)] 97.9 F (36.6 C) (02/11 0641) Pulse Rate:  [124-134] 130 (02/11 0730) Resp:  [16-20] 16 (02/11 0641) BP: (64-110)/(47-79) 64/47 mmHg (02/11 0730) SpO2:  [97 %-100 %] 97 % (02/11 0641) Weight:  [175 lb 9.6 oz (79.652 kg)] 175 lb 9.6 oz (79.652 kg) (02/11 0700) Last BM Date: 07/09/13 (rectal tube)  Intake/Output from previous day: 02/10 0701 - 02/11 0700 In: 3990 [I.V.:3135; IV Piggyback:715] Out: -  Intake/Output this shift:    Resp: clear to auscultation bilaterally Cardio: mild tachy GI: soft, NT, incision CDI, J tube with TF going Neuro: F/C, agitated  Lab Results:   Recent Labs  07/09/13 1055 07/10/13 0540  WBC 8.1 4.9  HGB 8.6* 8.4*  HCT 26.7* 25.8*  PLT 106* 93*   BMET  Recent Labs  07/09/13 1055 07/10/13 0540  NA 142 137  K 4.6 4.4  CL 107 101  CO2 24 22  GLUCOSE 98 103*  BUN <3* 4*  CREATININE 0.61 0.55  CALCIUM 7.7* 7.5*   PT/INR No results found for this basename: LABPROT, INR,  in the last 72 hours ABG No results found for this basename: PHART, PCO2, PO2, HCO3,  in the last 72 hours  Studies/Results: Dg Abd Portable 1v  07/10/2013   CLINICAL DATA:  Assess for ileus  EXAM: PORTABLE ABDOMEN - 1 VIEW  COMPARISON:  June 30, 2013  FINDINGS: There is persistent air-filled dilated small bowel loops in the left abdomen. Air is noted in the colon. Skin staples are projected over the right abdomen. Patient is status post prior cholecystectomy.  IMPRESSION: Persistent ileus.   Electronically Signed   By: Sherian ReinWei-Chen  Lin M.D.   On: 07/10/2013 02:30    Anti-infectives: Anti-infectives   Start     Dose/Rate Route Frequency Ordered Stop   07/02/13 1330  ciprofloxacin (CIPRO) IVPB 400 mg     400 mg 200 mL/hr over 60 Minutes Intravenous To Surgery 07/02/13 1321  07/02/13 1326   06/23/13 1400  piperacillin-tazobactam (ZOSYN) IVPB 3.375 g  Status:  Discontinued     3.375 g 12.5 mL/hr over 240 Minutes Intravenous 3 times per day 06/23/13 1336 06/26/13 1441   06/20/13 1200  vancomycin (VANCOCIN) IVPB 1000 mg/200 mL premix     1,000 mg 200 mL/hr over 60 Minutes Intravenous  Once 06/20/13 1146 06/20/13 1433      Assessment/Plan: S/P J tube revision POD#8 - on TF, still has flexiseal, monitor stool output for need to decrease paregoric. Abdominal film this AM with mild ileus pattern but mostly air in the colon Fever/soft BP - primary team is working up, possible UTI. Abdominal exam is unremarkable. If other work-up is unrevealing could consider CT abdomen and pelvis to R/O abscess.  LOS: 21 days    Nala Kachel E 07/10/2013

## 2013-07-10 NOTE — Progress Notes (Signed)
Patient Demographics  Joan [name], is a 53 y.o. female, DOB - 1960/11/25, WLS:[id number]  Admit date - 06/19/2013   Admitting Physician Charlynne Cousins, MD  Outpatient Primary MD for the patient is [name],[name], MD  LOS - 21   No chief complaint on file.       Assessment & Plan    Assumed care of the patient on 07/10/2013 on day 21 of her hospital stay   HPI/Subjective:  53 yr old female presents with a CC of Altered mental status. Pt has a PMH of peripheral neuropathy, seizures, catatonia, anxiety, HTN, and fibromyalgia. Pt was previously admitted on 12/13 thru 12/28 with toxic metabolic encephalopathy. 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 EEG showed no recurrent seizures. A 24 hour EEG showed an intermittent delta pattern felt to represent Frontal Intermittent Rhythmic Delta Activity. Pt was started on two antiepileptics.   This admission, she was again found altered and seemed "comatose" . Since d/c in December, pt has had persistent confusion and is confined to a wheelchair at [facility]. She is currently confused and experiencing visual hallucinations.She is s/p J tube placement and followed with intractable nausea, vomiting. Surgery following, . ON 2/3 night pt developed fever of 101.4, became tachycardic and hypotensive. CXR, done , UA ordered and blood cultures negative so far. Her work up so far negative for infection. H&H remains stable at 8.5 , and she was found to have mild leukocytosis.   Patient subsequently on 07/10/2013 was seen, she was found to be persistently hypotensive, tachycardic, febrile, despite 3 L of IV fluids her blood pressure stayed around 80 systolic, pulmonary critical care was consulted and patient was  transferred to step down unit. Possible sources of infection aspiration with hospital-acquired pneumonia, intra-abdominal sepsis due to recent J-tube reversal, dialysis catheter which has been placed about 2 weeks ago.       Assessment/Plan:   Sepsis - she was found to be persistently hypotensive, tachycardic, febrile, despite 3 L of IV fluids her blood pressure stayed around 80 systolic, pulmonary critical care was consulted and patient was transferred to step down unit. Possible sources of infection aspiration with hospital-acquired pneumonia, intra-abdominal sepsis due to recent J-tube reversal, dialysis catheter which has been placed about 2 weeks ago.  We will repeat sepsis workup which will include lactic acid, chest x-ray, UA, abdominal x-ray, blood cultures, CT scan abdomen pelvis to rule out intra-abdominal sepsis or abscess in the light of recent J-tube surgery, transferred to step down under primary critical care consultation, IV fluids, place on empiric antibiotics which will be vancomycin and aztreonam.    Anemia  Unclear etiology, s/p 2 unit s of prbc transfusion and started her on protonix . Her stool for occult blood negative in the last 2 weeks. Anemia panel showed severe iron deficiency .   We'll replace IV IM once she is stable from infection standpoint  She might require EGD once more stable, GI consulted recommended outpatient follow up with her GI physician at [facility].     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 Pt received diatech catheter on 06/20/2013 placement and received and finished  plasmapheresis 8 meds. -MRI of the brain showed no acute abnormality.-Ammonia level is normal . [facility] paraneoplastic panel reported to be negative, MRI of the C-spine is negative for acute findings  - We'll continue to monitor with supportive care, so far minimal improvement.    Tachycardia  - Patient  continues to have sinus tachycardia probably secondary to hypotension from volume depletion and fever.  - appears to be sinus tachy, will get 12 lead EKG .  -on po metoprolol.  - Stable echocardiogram and TSH.      Hx of Convulsions/Seizures  -Pt has a PMH of seizures and catatonia.  -MRI of the brain showed thalamic signal abnormality during previous admission. No longer present on current MRI.  -Pt currently taking Keppra and Depakene. Increased Depakene to 551m tid      Hypokalemia  - Repleted in stable     Nausea&Vomiting  Started after tube feeds was initiated, which has resolved. Mild ileus on exam we'll continue to monitor. Continue zofran, phenergan prn  She underwent revision of the j tube per surgery and small bowel resection on 2/3. Ng tube was taken out and she was started on very low rate tube feeds, slowly increasing as per surgery recommendations. She continues to have diarrhea, c diff pcr negative, rota virus positive provide supportive care.      Hx of Anxiety  -Continue pt on Remeron  -Patient appears to have had a psychiatric history  -Current illness is bringing out psychiatric symptoms.  -Psychiatry consulted as psychotropic medications may help with her current symptoms.  -Psych prescribed Risperdal .5 mg bid  Ativan prn for anxiety.     Hypertension  -Pt has a PMH of HTN  -Likely secondary to anxiety  Continue metoprolol and prn hydralazine.     Malnutrition with history of Gastric Bypass  -Per SNF patient has had no PO intake for over 1 week prior to admission.  -SNF sent her to hospital for J tube placement.  -Gen Surg consulted, J-tube placed and underwent revision of j tube withexploratory laparotomy on 2/3 and small bowel resection.       Recent hx of c-diff (05/18/13)  -Pt negative for C-diff at this admit     Thrombocytopenia  -Discontinue heparin, on SCD'S      Enterovaginal fistula  -Reported prior to that this  patient had some stools in her vagina.  -Discussed with radiology, no evidence of rectovaginal fistula and the previous imaging, (patient had indicated pelvic scan done previously).  - Consulted GI, and the did not feel that patient will benefit from colonoscopy for the diagnosis of fistula.  - Called and discussed with GMiramaron-call, and they recommend to follow the patient as outpatient. No further workup required in the hospital.    .      Code Status: full  Family Communication:    Disposition Plan:    SIGNIFICANT EVENTS / STUDIES:  1/21- admitted  1/23 - CT Chest . No evidence of malignancy or other significant abnormality of the chest  1/24 - MRI C-spine > No evidence of cervical disc herniation. Minimal degenerative changes upper thoracic spine  1/27 - J tube placed  2/3 - Small bowel resection and J-tube revision.  2/11 - Abd xray > Persistent ileus  07/05/2013 echocardiogram stable EF of 60% 11/16/2013 Lower extremity venous duplex no DVT   LINES / TUBES:  R Permacath 1/22 >>>  R PICC 1/20 >>>  Jtube 1/27 >>>  Flexiseal    CULTURES:  C-Diff 1/21 > Neg  Stool 1/21 > Neg  C-diff 1/25 > Neg  Urine 1/25 > Neg  ______________  ______________  Blood 2/4 > Neg  C-Diff 2/7 > Neg  Blood 2/11 >>>ng  Urine 2/11 >>>  C-diff 2/11 >>>neg  Blood 1/25 > Neg    Consults  Gen. surgery, neurology, PC CM, GYN   Medications  Scheduled Meds: . aztreonam  1 g Intravenous 3 times per day  . chlorhexidine  15 mL Mouth Rinse BID  . levETIRAcetam  1,000 mg Intravenous Q12H  . mirtazapine  30 mg Oral QHS  . pantoprazole (PROTONIX) IV  40 mg Intravenous Q12H  . paregoric  10 mL Per Tube Q6H  . risperiDONE  0.5 mg Oral BID  . sodium chloride  10 mL Intravenous Q12H  . valproate sodium  500 mg Intravenous 3 times per day  . vancomycin  1,000 mg Intravenous Once  . vancomycin  750 mg Intravenous Q8H   Continuous Infusions: . sodium chloride 125 mL/hr at 07/10/13 1017    . citrate dextrose    . phenylephrine (NEO-SYNEPHRINE) Adult infusion     PRN Meds:.acetaminophen, acetaminophen, hydrALAZINE, HYDROmorphone (DILAUDID) injection, loperamide, LORazepam, ondansetron, promethazine, sodium chloride, sodium chloride  DVT Prophylaxis  Heparin   Lab Results  Component Value Date   PLT 93* 07/10/2013    Antibiotics    Anti-infectives   Start     Dose/Rate Route Frequency Ordered Stop   07/10/13 2100  vancomycin (VANCOCIN) IVPB 750 mg/150 ml premix     750 mg 150 mL/hr over 60 Minutes Intravenous Every 8 hours 07/10/13 1215     07/10/13 1400  aztreonam (AZACTAM) 1 g in dextrose 5 % 50 mL IVPB     1 g 100 mL/hr over 30 Minutes Intravenous 3 times per day 07/10/13 1215     07/10/13 1230  vancomycin (VANCOCIN) IVPB 1000 mg/200 mL premix     1,000 mg 200 mL/hr over 60 Minutes Intravenous  Once 07/10/13 1215     07/02/13 1330  ciprofloxacin (CIPRO) IVPB 400 mg     400 mg 200 mL/hr over 60 Minutes Intravenous To Surgery 07/02/13 1321 07/02/13 1326   06/23/13 1400  piperacillin-tazobactam (ZOSYN) IVPB 3.375 g  Status:  Discontinued     3.375 g 12.5 mL/hr over 240 Minutes Intravenous 3 times per day 06/23/13 1336 06/26/13 1441   06/20/13 1200  vancomycin (VANCOCIN) IVPB 1000 mg/200 mL premix     1,000 mg 200 mL/hr over 60 Minutes Intravenous  Once 06/20/13 1146 06/20/13 1433          Subjective:   Joan [name] today is in hospital bed, does not appear to be in any discomfort however appears febrile, unable to answer questions  Objective:   Filed Vitals:   07/10/13 1115 07/10/13 1200 07/10/13 1215 07/10/13 1223  BP: 80/60 96/69 72/27    Pulse: 136 135 139   Temp:    98.9 F (37.2 C)  TempSrc:    Oral  Resp:  20 13   Height:      Weight:      SpO2: 89% 97% 100%     Wt Readings from Last 3 Encounters:  07/10/13 79.652 kg (175 lb 9.6 oz)  07/10/13 79.652 kg (175 lb 9.6 oz)  07/10/13 79.652 kg (175 lb 9.6 oz)     Intake/Output Summary  (Last 24 hours) at 07/10/13 1234 Last data filed at 07/10/13 0930  Gross per 24 hour  Intake  3950 ml  Output   1200 ml  Net   2750 ml     Physical Exam  Awake, not alert, No new F.N deficits, Normal affect [place].AT,PERRAL Supple Neck,No JVD, No cervical lymphadenopathy appriciated.  Symmetrical Chest wall movement, Good air movement bilaterally, CTAB RRR,No Gallops,Rubs or new Murmurs, No Parasternal Heave +ve B.Sounds, Abd Soft has PEG tube in place, recent surgery scar stable with staples, Non tender, No organomegaly appriciated, No rebound - guarding or rigidity. No Cyanosis, Clubbing or edema, No new Rash or bruise      Data Review   Micro Results Recent Results (from the past 240 hour(s))  CULTURE, BLOOD (ROUTINE X 2)     Status: None   Collection Time    07/03/13  6:40 AM      Result Value Ref Range Status   Specimen Description BLOOD LEFT HAND   Final   Special Requests BOTTLES DRAWN AEROBIC ONLY 2CC   Final   Culture  Setup Time     Final   Value: 07/03/2013 13:19     Performed at [facility]   Culture     Final   Value: NO GROWTH 5 DAYS     Performed at [facility]   Report Status 07/09/2013 FINAL   Final  CULTURE, BLOOD (ROUTINE X 2)     Status: None   Collection Time    07/03/13  6:55 AM      Result Value Ref Range Status   Specimen Description BLOOD LEFT ARM   Final   Special Requests BOTTLES DRAWN AEROBIC ONLY 1CC   Final   Culture  Setup Time     Final   Value: 07/03/2013 13:19     Performed at [facility]   Culture     Final   Value: NO GROWTH 5 DAYS     Performed at [facility]   Report Status 07/09/2013 FINAL   Final  CLOSTRIDIUM DIFFICILE BY PCR     Status: None   Collection Time    07/06/13 10:27 AM      Result Value Ref Range Status   C difficile by pcr NEGATIVE  NEGATIVE Final  CLOSTRIDIUM DIFFICILE BY PCR     Status: None   Collection Time    07/10/13  8:33 AM      Result Value Ref Range Status   C  difficile by pcr NEGATIVE  NEGATIVE Final    Radiology Reports Dg Abd 1 View  06/27/2013   CLINICAL DATA:  Nausea, abdominal pain, vomiting  EXAM: ABDOMEN - 1 VIEW  COMPARISON:  None.  FINDINGS: There is a surgical drain present in the mid abdomen. There are surgical stated in the right paramedian abdominal wall. There is a small amount of contrast scattered throughout the colon. There is no bowel dilatation to suggest obstruction. There is no evidence of pneumoperitoneum, portal venous gas or pneumatosis. There are no pathologic calcifications along the expected course of the ureters.The osseous structures are unremarkable.  IMPRESSION: Nonobstructive bowel gas pattern.   Electronically Signed   By: Kathreen Devoid   On: 06/27/2013 09:34      Ct Head Wo Contrast  06/13/2013   CLINICAL DATA:  Altered mental status. Failure to thrive. Seizures.  EXAM: CT HEAD WITHOUT CONTRAST  TECHNIQUE: Contiguous axial images were obtained from the base of the skull through the vertex without intravenous contrast.  COMPARISON:  05/13/2013  FINDINGS: There is no evidence of intracranial hemorrhage,  brain edema, or other signs of acute infarction. There is no evidence of intracranial mass lesion or mass effect. No abnormal extraaxial fluid collections are identified.  Mild diffuse cerebral atrophy is stable. No evidence hydrocephalus. No other intracranial abnormality identified. No skull fracture or other bone lesion identified.  IMPRESSION: No acute intracranial findings.  Stable mild cerebral atrophy.   Electronically Signed   By: Earle Gell M.D.   On: 06/13/2013 19:53     Ct Chest W Contrast  06/21/2013   CLINICAL DATA:  Altered mental status.  Evaluate for malignancy.  EXAM: CT CHEST WITH CONTRAST  TECHNIQUE: Multidetector CT imaging of the chest was performed during intravenous contrast administration.  CONTRAST:  89m OMNIPAQUE IOHEXOL 300 MG/ML  SOLN  COMPARISON:  Chest x-ray dated 05/11/2013  FINDINGS: Heart  size and pulmonary vascularity are normal. There is no hilar or mediastinal adenopathy. The lungs are clear. No mass lesions, infiltrates, or effusions. Soft tissues of the chest are normal. Thyroid gland is normal. No acute osseous abnormality. Osteophytes fuse the thoracic spine from T6 through T10.  IMPRESSION: No evidence of malignancy or other significant abnormality of the chest.   Electronically Signed   By: JRozetta NunneryM.D.   On: 06/21/2013 17:13     Mr BJeri CosWJSContrast  06/20/2013   CLINICAL DATA:  Altered mental status. Possible autoimmune encephalitis.  EXAM: MRI HEAD WITHOUT AND WITH CONTRAST  TECHNIQUE: Multiplanar, multiecho pulse sequences of the brain and surrounding structures were obtained without and with intravenous contrast.  CONTRAST:  135mMULTIHANCE GADOBENATE DIMEGLUMINE 529 MG/ML IV SOLN  COMPARISON:  Head CT 06/13/2013 and brain MRI 05/16/2013  FINDINGS: Images are mildly to moderately degraded by motion artifact.  Incidental note is again made of a partially empty sella. No definite residual abnormal T2 or diffusion weighted signal is identified in the thalami. Mild periventricular T2 hyperintensity is unchanged and nonspecific. No new areas of brain parenchymal signal abnormality are identified. There is mild cerebral atrophy. There is no evidence of acute infarct. There is no evidence of mass, midline shift, or extra-axial fluid collection. Orbits are unremarkable. Major intracranial vascular flow voids are unremarkable. Visualized paranasal sinuses and mastoid air cells are clear. There is no abnormal enhancement.  IMPRESSION: Interval resolution of thalamic signal abnormality. No evidence of new intracranial abnormality.   Electronically Signed   By: AlLogan Bores On: 06/20/2013 17:26    Mr Cervical Spine Wo Contrast  06/22/2013   CLINICAL DATA:  Right hemiparesis. Possible autoimmune encephalitis.  EXAM: MRI CERVICAL SPINE WITHOUT CONTRAST  TECHNIQUE: Multiplanar,  multisequence MR imaging was performed. No intravenous contrast was administered.  COMPARISON:  No comparison cervical spine MR. brain MR 06/20/2013.  FINDINGS: On this motion grade examination, artifact extends through the cervical cord however, no definitive cervical cord signal abnormality is noted. Cervical medullary junction unremarkable. Intracranial structures as detailed on recent MR of the brain.  Visualized paravertebral structures unremarkable. Both vertebral arteries are patent.  C2-3:  Negative.  C3-4:  Minimal right foraminal narrowing.  C4-5:  Minimal right foraminal narrowing.  C5-6:  Negative.  C6-7:  Negative.  C7-T1:  Negative.  T1-2:  Negative.  T2-3: Minimal Schmorl's node deformity.  Minimal bulge.  T3-4: Minimal bulge.  T4-5: Minimal bulge.  IMPRESSION: No evidence of cervical disc herniation.  Minimal degenerative changes upper thoracic spine.  Please see above.   Electronically Signed   By: StChauncey Cruel.D.   On: 06/22/2013 19:38  US Transvaginal Non-ob  06/21/2013   CLINICAL DATA:  Altered mental status. Evaluation for possible paraneoplastic syndrome.  EXAM: TRANSABDOMINAL AND TRANSVAGINAL ULTRASOUND OF PELVIS  TECHNIQUE: Both transabdominal and transvaginal ultrasound examinations of the pelvis were performed. Transabdominal technique was performed for global imaging of the pelvis including uterus, ovaries, adnexal regions, and pelvic cul-de-sac. It was necessary to proceed with endovaginal exam following the transabdominal exam to visualize the ovaries.  COMPARISON:  None  FINDINGS: Uterus  Removed.  Right ovary  Not visualized.  Left ovary  Not visualized.  Other findings  No free fluid.  IMPRESSION: The ovaries could not be visualized on transvaginal or transabdominal pelvic ultrasound. However, review of the CT scan of the pelvis dated 05/17/2013 does demonstrate that both ovaries appear normal, measuring 23 mm on the right and 16 mm on the left.   Electronically Signed   By:  Rozetta Nunnery M.D.   On: 06/21/2013 16:45   US Pelvis Complete  06/21/2013   CLINICAL DATA:  Altered mental status. Evaluation for possible paraneoplastic syndrome.  EXAM: TRANSABDOMINAL AND TRANSVAGINAL ULTRASOUND OF PELVIS  TECHNIQUE: Both transabdominal and transvaginal ultrasound examinations of the pelvis were performed. Transabdominal technique was performed for global imaging of the pelvis including uterus, ovaries, adnexal regions, and pelvic cul-de-sac. It was necessary to proceed with endovaginal exam following the transabdominal exam to visualize the ovaries.  COMPARISON:  None  FINDINGS: Uterus  Removed.  Right ovary  Not visualized.  Left ovary  Not visualized.  Other findings  No free fluid.  IMPRESSION: The ovaries could not be visualized on transvaginal or transabdominal pelvic ultrasound. However, review of the CT scan of the pelvis dated 05/17/2013 does demonstrate that both ovaries appear normal, measuring 23 mm on the right and 16 mm on the left.   Electronically Signed   By: Rozetta Nunnery M.D.   On: 06/21/2013 16:45   Ir Fluoro Guide Cv Line Right  06/20/2013   CLINICAL DATA:  Encephalitis and need for tunneled catheter for plasmapheresis.  EXAM: TUNNELED CENTRAL VENOUS CATHETER PLACEMENT WITH ULTRASOUND AND FLUOROSCOPIC GUIDANCE  MEDICATIONS: 1 g IV vancomycin. Vancomycin was given within two hours of incision. Vancomycin was given due to an antibiotic allergy.  ANESTHESIA/SEDATION: 2.0 mg IV Versed; 100 mcg IV Fentanyl.  Total Moderate Sedation Time  Twenty minutes.  FLUOROSCOPY TIME:  42 seconds.  PROCEDURE: The procedure, risks, benefits, and alternatives were explained to the patient. Questions regarding the procedure were encouraged and answered. The patient understands and consents to the procedure.  The right neck and chest were prepped with chlorhexidine in a sterile fashion, and a sterile drape was applied covering the operative field. Maximum barrier sterile technique with sterile  gowns and gloves were used for the procedure. Local anesthesia was provided with 1% lidocaine.  Ultrasound was used to confirm patency of the right internal jugular vein. After creating a small venotomy incision, a 21 gauge needle was advanced into the right internal jugular vein under direct, real-time ultrasound guidance. Ultrasound image documentation was performed. After securing guidewire access, an 8 Fr dilator was placed. A J-wire was kinked to measure appropriate catheter length.  A Bard Equistream tunneled hemodialysis/pheresis catheter measuring 19 cm from tip to cuff was chosen for placement. This was tunneled in a retrograde fashion from the chest wall to the venotomy incision.  At the venotomy, serial dilatation was performed and a 16 Fr peel-away sheath was placed over a guidewire. The catheter was then placed through the  sheath and the sheath removed. Final catheter positioning was confirmed and documented with a fluoroscopic spot image. The catheter was aspirated, flushed with saline, and injected with appropriate volume heparin dwells.  The venotomy incision was closed with subcutaneous 3-0 Monocryl and subcuticular 4-0 Vicryl. Dermabond was applied to the incision. The catheter exit site was secured with 0-Prolene retention sutures.  COMPLICATIONS: None.  No pneumothorax.  FINDINGS: After catheter placement, the tips lie in the right atrium. The catheter aspirates normally and is ready for immediate use.  IMPRESSION: Placement of tunneled pheresis catheter via the right internal jugular vein. The catheter tips lie in the right atrium. The catheter is ready for immediate use.   Electronically Signed   By: Aletta Edouard M.D.   On: 06/20/2013 14:27   Ir Fluoro Guide Cv Line Right  06/18/2013   EXAM: ULTRASOUND AND FLUOROSCOPIC GUIDED PICC LINE INSERTION  MEDICATIONS: None.  TECHNIQUE: The procedure, risks, benefits, and alternatives were explained to the patient's son and informed written consent  was obtained. A timeout was performed prior to the initiation of the procedure.  The right upper extremity was prepped with chlorhexidine in a sterile fashion, and a sterile drape was applied covering the operative field. Maximum barrier sterile technique with sterile gowns and gloves were used for the procedure. A timeout was performed prior to the initiation of the procedure. Local anesthesia was provided with 1% lidocaine.  Under direct ultrasound guidance, the right brachial vein was accessed with a micropuncture kit after the overlying soft tissues were anesthetized with 1% lidocaine. An ultrasound image was saved for documentation purposes. A guidewire was advanced to the level of the superior caval-atrial junction for measurement purposes and the PICC line was cut to length. A peel-away sheath was placed and a 36 cm, 5 [place], dual lumen was inserted to level of the superior caval-atrial junction. A post procedure spot fluoroscopic was obtained. The catheter easily aspirated and flushed and was sutured in place. A dressing was placed. The patient tolerated the procedure well without immediate post procedural complication.  CONTRAST:  None  FLUOROSCOPY TIME:  36 seconds.  COMPLICATIONS: None immediate  FINDINGS: After catheter placement, the tip lies within the superior cavoatrial junction. The catheter aspirates and flushes normally and is ready for immediate use.  IMPRESSION: Successful ultrasound and fluoroscopic guided placement of a right brachial vein approach, 36 cm, 5 French, dual lumen PICC with tip at the superior caval-atrial junction. The PICC line is ready for immediate use.  INDICATION: Poor venous access, in need of intravenous access for blood draws and medication administration   Electronically Signed   By: Sandi Mariscal M.D.   On: 06/18/2013 15:51      Dg Chest Port 1 View  07/10/2013   CLINICAL DATA:  Shortness of breath.  Hypotension  EXAM: PORTABLE CHEST - 1 VIEW  COMPARISON:  DG CHEST  1V PORT dated 07/03/2013; CT CHEST W/CM dated 06/21/2013  FINDINGS: Right internal jugular dialysis catheter tip: Right atrium. Thoracic spondylosis noted. There is a band of airspace opacity in the right upper lobe just above the minor fissure. Improved aeration at the right lung base.  IMPRESSION: Improved aeration at the right lung base, with but there is a band of airspace opacity in the right upper lobe favoring atelectasis over pneumonia. Next item thoracic spondylosis.   Electronically Signed   By: Sherryl Barters M.D.   On: 07/10/2013 08:27     Dg Chest Port 1 View  07/03/2013  CLINICAL DATA:  Tubes and lines in good position, detailed above.  EXAM: PORTABLE CHEST - 1 VIEW  COMPARISON:  Chest CT 06/21/2013  FINDINGS: There is nasogastric tube which terminates near the pylorus. Right IJ catheter, tip in the upper right atrium. Right upper extremity PICC, tip at the upper cavoatrial junction.  Oral contrast noted within the gastric fundus and small bowel.  Haziness of the right base, with volume loss. Node definitive consolidation.  No cardiomegaly.  IMPRESSION: 1. Negative for pneumonia. 2. Haziness of the right lower chest, likely pleural fluid or atelectasis. 3. Tubes and lines in good position, detailed above.   Electronically Signed   By: Jorje Guild M.D.   On: 07/03/2013 03:35     Dg Abd Portable 1v  07/10/2013   CLINICAL DATA:  Assess for ileus  EXAM: PORTABLE ABDOMEN - 1 VIEW  COMPARISON:  June 30, 2013  FINDINGS: There is persistent air-filled dilated small bowel loops in the left abdomen. Air is noted in the colon. Skin staples are projected over the right abdomen. Patient is status post prior cholecystectomy.  IMPRESSION: Persistent ileus.   Electronically Signed   By: Abelardo Diesel M.D.   On: 07/10/2013 02:30     Dg Abd Portable 1v  06/30/2013   CLINICAL DATA:  NG tube placement.  EXAM: PORTABLE ABDOMEN - 1 VIEW  COMPARISON:  Single view of the abdomen 06/27/2013. Upper GI/small  bowel follow-through 06/28/2013  FINDINGS: NG tube is in place in good position with the tip in this distal stomach. Contrast material in small bowel from the comparison upper GI series/small bowel follow-through. Small bowel loops are dilated. Only a small amount of loculated barium is seen in the colon.  IMPRESSION: NG tube in good position.  Small bowel obstruction.   Electronically Signed   By: Inge Rise M.D.   On: 06/30/2013 19:01     Dg Addison Bailey G Tube Plc W/fl-no Rad  06/17/2013   CLINICAL DATA: patient to have peg tube placement on January 20th   NASO G TUBE PLACEMENT WITH FLUORO  Fluoroscopy was utilized by the requesting physician.  No radiographic  interpretation.     Dg Ugi W/small Bowel  06/28/2013   CLINICAL DATA:  53 year old female postop 2 days since percutaneous jejunostomy tube placement. Drainage from NG tube. Abdominal pain. Initial encounter.  EXAM: UPPER GI SERIES WITH SMALL BOWEL FOLLOW-THROUGH  FLUOROSCOPIC GUIDED NG TUBE PLACEMENT  TECHNIQUE: Combined double contrast and single contrast upper GI series using effervescent crystals, thick barium, and thin barium. Subsequently, serial images of the small bowel were obtained including spot views of the terminal ileum.  COMPARISON:  KUB 06/27/2013 and earlier.  FLUOROSCOPY TIME:  9 min and 18 seconds.  FINDINGS: Preprocedural scout view of the abdomen demonstrates a small volume of loculated barium in the colon. This is related to the 06/17/2013 NG tube placement/ confirmation study.  Scout view demonstrates the recently placed right an mid lower abdomen percutaneous jejunostomy. Right lower abdomen skin staples in place.  The scout view also demonstrates the patient's NG tube is looped back up into the esophagus. This was evaluated in real-time with fluoroscopy, and a wire was placed through the tube in an effort to maneuver the tip back into the stomach. However, this was unsuccessful an the tube had be retracted into the  nasopharynx. Using fluoroscopic guidance, the tube was then advanced with a wire into the stomach, such that the tip and side hole are within the stomach.  Given the recent abdominal surgery, initially water-soluble contrast was planned, however prominent gastroesophageal reflux of contrast was noted early on, and the decision was made to switch contrast to thin barium. Water-soluble contrast was fully aspirated from the stomach. Subsequently, barium contrast was slowly administered.  The stomach appears small. No gastrojejunostomy is evident. After a short delay, barium emptied the stomach into the proximal duodenum. The second portion of the duodenum is dilated.  After 20 additional min significantly dilated proximal jejunum is opacified, with the small bowel caliber up to 58 mm.  At this point study was discussed with Dr. Judeth Horn . We decided to inject the percutaneous jejunostomy tube, and give the barium more time to opacified a small bowel in hopes of loose to dating the small bowel obstruction transition point.  Barium injection through the jejunostomy tube demonstrates normal small bowel loops at the jejunostomy site. This barium was then aspirated.  Subsequently a 3 hr delayed image was obtained demonstrating a gradual tapering of small bowel loops into the right lower abdomen, heading toward the vicinity of the jejunostomy. These smaller loops measure 23 mm diameter (arrow).  At this point, Dr. Hulen Skains advised that no additional imaging was needed.  IMPRESSION: 1. High-grade small bowel obstruction. Dilated duodenum and proximal jejunum up to 58 mm diameter. 2. Small bowel loops slowly taper to the right lower abdomen in the vicinity of the percutaneous jejunostomy. 3. The jejunostomy was injected with contrast, and is situated within normal decompressed small bowel. 4. Diminutive stomach. No gastrojejunostomy is evident. Prominent gastroesophageal reflux. 5. At the beginning of the exam the patient's NG  tube was malpositioned. This was withdrawn to the nasopharynx and repositioned into the stomach under fluoroscopic guidance.   Electronically Signed   By: Lars Pinks M.D.   On: 06/28/2013 16:29      CBC  Recent Labs Lab 07/06/13 0310 07/07/13 0325 07/08/13 0430 07/09/13 1055 07/10/13 0540  WBC 5.5 7.4 5.8 8.1 4.9  HGB 8.0* 8.8* 8.0* 8.6* 8.4*  HCT 24.3* 26.7* 24.9* 26.7* 25.8*  PLT 81* 111* 97* 106* 93*  MCV 97.2 96.7 97.6 98.9 97.7  MCH 32.0 31.9 31.4 31.9 31.8  MCHC 32.9 33.0 32.1 32.2 32.6  RDW 17.5* 17.4* 17.5* 17.3* 17.0*    Chemistries   Recent Labs Lab 07/04/13 0440 07/04/13 1224 07/05/13 0530 07/06/13 0310 07/07/13 0325 07/08/13 0430 07/09/13 1055 07/10/13 0540  NA 142  --  144 144 141 145 142 137  K 4.1  --  4.4 4.2 4.3 4.6 4.6 4.4  CL 107  --  108 109 106 111 107 101  CO2 25  --  25 26 25 27 24 22   GLUCOSE 93  --  104* 100* 103* 102* 98 103*  BUN 5*  --  4* <3* <3* <3* <3* 4*  CREATININE 0.62  --  0.60 0.60 0.60 0.62 0.61 0.55  CALCIUM 7.8*  --  7.6* 7.4* 7.8* 7.9* 7.7* 7.5*  MG 1.7  --  1.7  --   --  1.9  --   --   AST  --  12  --   --   --   --   --   --   ALT  --  7  --   --   --   --   --   --   ALKPHOS  --  38*  --   --   --   --   --   --  BILITOT  --  0.4  --   --   --   --   --   --    ------------------------------------------------------------------------------------------------------------------ estimated creatinine clearance is 83.1 ml/min (by C-G formula based on Cr of 0.55). ------------------------------------------------------------------------------------------------------------------ No results found for this basename: HGBA1C,  in the last 72 hours ------------------------------------------------------------------------------------------------------------------ No results found for this basename: CHOL, HDL, LDLCALC, TRIG, CHOLHDL, LDLDIRECT,  in the last 72  hours ------------------------------------------------------------------------------------------------------------------ No results found for this basename: TSH, T4TOTAL, FREET3, T3FREE, THYROIDAB,  in the last 72 hours ------------------------------------------------------------------------------------------------------------------ No results found for this basename: VITAMINB12, FOLATE, FERRITIN, TIBC, IRON, RETICCTPCT,  in the last 72 hours  Coagulation profile No results found for this basename: INR, PROTIME,  in the last 168 hours  No results found for this basename: DDIMER,  in the last 72 hours  Cardiac Enzymes No results found for this basename: CK, CKMB, TROPONINI, MYOGLOBIN,  in the last 168 hours ------------------------------------------------------------------------------------------------------------------ No components found with this basename: POCBNP,      Time Spent in minutes  35   Sheron Tallman K M.D on 07/10/2013 at 12:34 PM  Between 7am to 7pm - Pager - [phone]  After 7pm go to [email or web address] - password [email or web address]  And look for the night coverage person covering for me after hours  [facility] Office  [phone]

## 2013-07-10 NOTE — Consult Note (Signed)
ANTIBIOTIC CONSULT NOTE - INITIAL  Pharmacy Consult for Vancomycin and Azactam Indication: sepsis, ? Abdominal source  Allergies  Allergen Reactions  . Morphine And Related Other (See Comments)    REACTION: Causes pain  . Paroxetine Hcl Other (See Comments)    REACTION:  unknown  . Penicillins Other (See Comments)    REACTION: Makes her body hurt.  . Sertraline Hcl Other (See Comments)    REACTION:  unknown    Patient Measurements: Height: 5\' 3"  (160 cm) Weight: 175 lb 9.6 oz (79.652 kg) IBW/kg (Calculated) : 52.4  Vital Signs: Temp: 101.5 F (38.6 C) (02/11 0745) Temp src: Rectal (02/11 0745) BP: 80/60 mmHg (02/11 1115) Pulse Rate: 136 (02/11 1115) Intake/Output from previous day: 02/10 0701 - 02/11 0700 In: 3990 [I.V.:3135; IV Piggyback:715] Out: -  Intake/Output from this shift: Total I/O In: 30 [Other:30] Out: 1200 [Urine:500; Stool:700]  Labs:  Recent Labs  07/08/13 0430 07/09/13 1055 07/10/13 0540  WBC 5.8 8.1 4.9  HGB 8.0* 8.6* 8.4*  PLT 97* 106* 93*  CREATININE 0.62 0.61 0.55   Estimated Creatinine Clearance: 83.1 ml/min (by C-G formula based on Cr of 0.55).  Microbiology: Recent Results (from the past 720 hour(s))  URINE CULTURE     Status: None   Collection Time    06/13/13  7:13 PM      Result Value Ref Range Status   Specimen Description URINE, CATHETERIZED   Final   Special Requests NONE   Final   Culture  Setup Time     Final   Value: 06/14/2013 02:33     [facility]formed at [facility]   Colony Count     Final   Value: NO GROWTH     [facility]formed at [facility]   Culture     Final   Value: NO GROWTH     Performed at [facility]   Report Status 06/14/2013 FINAL   Final  CLOSTRIDIUM DIFFICILE BY PCR     Status: None   Collection Time    06/19/13  5:14 PM      Result Value Ref Range Status   C difficile by pcr NEGATIVE  NEGATIVE Final  MRSA PCR SCREENING     Status: None   Collection Time    06/19/13  5:14 PM    Result Value Ref Range Status   MRSA by PCR NEGATIVE  NEGATIVE Final   Comment:            The GeneXpert MRSA Assay (FDA     approved for NASAL specimens     only), is one component of a     comprehensive MRSA colonization     surveillance program. It is not     intended to diagnose MRSA     infection nor to guide or     monitor treatment for     MRSA infections.  STOOL CULTURE     Status: None   Collection Time    06/19/13  5:14 PM      Result Value Ref Range Status   Specimen Description STOOL   Final   Special Requests NONE   Final   Culture     Final   Value: NO SALMONELLA, SHIGELLA, CAMPYLOBACTER, YERSINIA, OR E.COLI 0157:H7 ISOLATED     Performed at [facility]   Report Status 06/23/2013 FINAL   Final  CULTURE, BLOOD (ROUTINE X 2)     Status: None   Collection Time  06/23/13  2:02 PM      Result Value Ref Range Status   Specimen Description BLOOD LEFT ARM   Final   Special Requests BOTTLES DRAWN AEROBIC ONLY 10CC   Final   Culture  Setup Time     Final   Value: 06/23/2013 19:55     Performed at [facility]   Culture     Final   Value: NO GROWTH 5 DAYS     Performed at [facility]   Report Status 06/29/2013 FINAL   Final  CULTURE, BLOOD (ROUTINE X 2)     Status: None   Collection Time    06/23/13  2:14 PM      Result Value Ref Range Status   Specimen Description BLOOD LEFT HAND   Final   Special Requests BOTTLES DRAWN AEROBIC ONLY 5.5 CC   Final   Culture  Setup Time     Final   Value: 06/23/2013 19:55     Performed at [facility]   Culture     Final   Value: NO GROWTH 5 DAYS     Performed at [facility]   Report Status 06/29/2013 FINAL   Final  CLOSTRIDIUM DIFFICILE BY PCR     Status: None   Collection Time    06/23/13  5:08 PM      Result Value Ref Range Status   C difficile by pcr NEGATIVE  NEGATIVE Final  URINE CULTURE     Status: None   Collection Time    06/23/13  6:15 PM      Result Value Ref Range  Status   Specimen Description URINE, CLEAN CATCH   Final   Special Requests NONE   Final   Culture  Setup Time     Final   Value: 06/24/2013 01:22     Performed at [facility] Count     Final   Value: NO GROWTH     Performed at [facility]   Culture     Final   Value: NO GROWTH     Performed at [facility]   Report Status 06/24/2013 FINAL   Final  CULTURE, BLOOD (ROUTINE X 2)     Status: None   Collection Time    07/03/13  6:40 AM      Result Value Ref Range Status   Specimen Description BLOOD LEFT HAND   Final   Special Requests BOTTLES DRAWN AEROBIC ONLY 2CC   Final   Culture  Setup Time     Final   Value: 07/03/2013 13:19     Performed at [facility]   Culture     Final   Value: NO GROWTH 5 DAYS     Performed at [facility]   Report Status 07/09/2013 FINAL   Final  CULTURE, BLOOD (ROUTINE X 2)     Status: None   Collection Time    07/03/13  6:55 AM      Result Value Ref Range Status   Specimen Description BLOOD LEFT ARM   Final   Special Requests BOTTLES DRAWN AEROBIC ONLY 1CC   Final   Culture  Setup Time     Final   Value: 07/03/2013 13:19     Performed at [facility]   Culture     Final   Value: NO GROWTH 5 DAYS     Performed at [facility]   Report Status 07/09/2013  FINAL   Final  CLOSTRIDIUM DIFFICILE BY PCR     Status: None   Collection Time    07/06/13 10:27 AM      Result Value Ref Range Status   C difficile by pcr NEGATIVE  NEGATIVE Final  CLOSTRIDIUM DIFFICILE BY PCR     Status: None   Collection Time    07/10/13  8:33 AM      Result Value Ref Range Status   C difficile by pcr NEGATIVE  NEGATIVE Final    Medical History: Past Medical History  Diagnosis Date  . Peripheral neuropathy   . Fibromyalgia   . Hypertension   . Anxiety   . Catatonia   . Seizures    Assessment: 52yom from SNF admitted 1/21 with AMS. PCCM consulted today for fever and hypotension. She has been moved  to the ICU and will begin empiric antibiotics for probable sepsis with ? 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 Renal function wnl. All cultures negative so far.  Goal of Therapy:  Vancomycin trough level 15-20 mcg/ml  Plan:  1) Vancomycin 1g IV x 1 then 750mg  IV q8 2) Azactam 1g IV q8 3) Follow renal function, cultures, LOT, level at steady state  Fredrik Rigger 07/10/2013,12:05 PM

## 2013-07-11 ENCOUNTER — Inpatient Hospital Stay (HOSPITAL_COMMUNITY): Payer: Medicare Other

## 2013-07-11 DIAGNOSIS — R4182 Altered mental status, unspecified: Secondary | ICD-10-CM | POA: Diagnosis not present

## 2013-07-11 DIAGNOSIS — G0490 Encephalitis and encephalomyelitis, unspecified: Secondary | ICD-10-CM | POA: Diagnosis not present

## 2013-07-11 DIAGNOSIS — E876 Hypokalemia: Secondary | ICD-10-CM

## 2013-07-11 LAB — GLUCOSE, CAPILLARY
GLUCOSE-CAPILLARY: 82 mg/dL (ref 70–99)
Glucose-Capillary: 71 mg/dL (ref 70–99)
Glucose-Capillary: 69 mg/dL — ABNORMAL LOW (ref 70–99)
Glucose-Capillary: 83 mg/dL (ref 70–99)
Glucose-Capillary: 82 mg/dL (ref 70–99)

## 2013-07-11 LAB — BASIC METABOLIC PANEL
BUN: 5 mg/dL — AB (ref 6–23)
CO2: 16 mEq/L — ABNORMAL LOW (ref 19–32)
CREATININE: 0.37 mg/dL — AB (ref 0.50–1.10)
Calcium: 5 mg/dL — CL (ref 8.4–10.5)
Chloride: 108 mEq/L (ref 96–112)
GFR calc Af Amer: >90 mL/min (ref >90)
GLUCOSE: 306 mg/dL — AB (ref 70–99)
Potassium: 2.5 mEq/L — CL (ref 3.7–5.3)
Sodium: 134 mEq/L — ABNORMAL LOW (ref 137–147)

## 2013-07-11 LAB — CBC
HCT: 18.7 % — ABNORMAL LOW (ref 36.0–46.0)
Hemoglobin: 6.2 g/dL — CL (ref 12.0–15.0)
MCH: 32.6 pg (ref 26.0–34.0)
MCHC: 33.2 g/dL (ref 30.0–36.0)
MCV: 98.4 fL (ref 78.0–100.0)
Platelets: 96 10*3/uL — ABNORMAL LOW (ref 150–400)
RBC: 1.9 MIL/uL — ABNORMAL LOW (ref 3.87–5.11)
RDW: 17.4 % — AB (ref 11.5–15.5)
WBC: 7.8 10*3/uL (ref 4.0–10.5)

## 2013-07-11 LAB — PREPARE RBC (CROSSMATCH)

## 2013-07-11 LAB — CALCIUM, IONIZED: CALCIUM ION: 1.19 mmol/L (ref 1.12–1.23)

## 2013-07-11 MED ORDER — DIPHENHYDRAMINE HCL 50 MG/ML IJ SOLN: 25.0000 mg | Freq: Four times a day (QID) | INTRAMUSCULAR | Status: DC | PRN

## 2013-07-11 MED ORDER — DEXTROSE 50 % IV SOLN
1.0000 | Freq: Once | INTRAVENOUS | Status: AC
  Administered 2013-07-11: 50 mL via INTRAVENOUS

## 2013-07-11 MED ORDER — CIPROFLOXACIN IN D5W 400 MG/200ML IV SOLN
400.0000 mg | Freq: Two times a day (BID) | INTRAVENOUS | Status: DC
  Administered 2013-07-11 – 2013-07-16 (×11): 400 mg via INTRAVENOUS
  Filled 2013-07-11 (×13): qty 200

## 2013-07-11 MED ORDER — MAGNESIUM SULFATE IN D5W 10-5 MG/ML-% IV SOLN
1.0000 g | Freq: Once | INTRAVENOUS | Status: AC
  Administered 2013-07-11: 1 g via INTRAVENOUS
  Filled 2013-07-11 (×2): qty 100

## 2013-07-11 MED ORDER — ENOXAPARIN SODIUM 40 MG/0.4ML ~~LOC~~ SOLN
40.0000 mg | SUBCUTANEOUS | Status: DC
  Administered 2013-07-11 – 2013-07-15 (×5): 40 mg via SUBCUTANEOUS
  Filled 2013-07-11 (×6): qty 0.4

## 2013-07-11 MED ORDER — SODIUM CHLORIDE 0.9 % IV SOLN: 1.0000 g | Freq: Once | INTRAVENOUS | Status: DC

## 2013-07-11 MED ORDER — PANTOPRAZOLE SODIUM 40 MG PO PACK
40.0000 mg | PACK | Freq: Every day | ORAL | Status: DC
  Administered 2013-07-11 – 2013-07-16 (×5): 40 mg
  Filled 2013-07-11 (×6): qty 20

## 2013-07-11 MED ORDER — MIRTAZAPINE 15 MG PO TBDP
15.0000 mg | ORAL_TABLET | Freq: Every day | ORAL | Status: DC
  Administered 2013-07-12 – 2013-07-13 (×2): 15 mg
  Filled 2013-07-11 (×4): qty 1

## 2013-07-11 MED ORDER — POTASSIUM CHLORIDE 10 MEQ/100ML IV SOLN
10.0000 meq | INTRAVENOUS | Status: AC
  Administered 2013-07-11 (×6): 10 meq via INTRAVENOUS
  Filled 2013-07-11: qty 100

## 2013-07-11 MED ORDER — POTASSIUM CHLORIDE CRYS ER 20 MEQ PO TBCR
40.0000 meq | EXTENDED_RELEASE_TABLET | Freq: Two times a day (BID) | ORAL | Status: AC
  Administered 2013-07-11: 40 meq via ORAL
  Filled 2013-07-11 (×2): qty 2

## 2013-07-11 MED ORDER — VITAL AF 1.2 CAL PO LIQD
1000.0000 mL | ORAL | Status: DC
  Administered 2013-07-11 – 2013-07-15 (×3): 1000 mL
  Filled 2013-07-11 (×11): qty 1000

## 2013-07-11 MED ORDER — DEXTROSE 50 % IV SOLN
INTRAVENOUS | Status: AC
Start: 2013-07-11 — End: 2013-07-11
  Filled 2013-07-11: qty 50

## 2013-07-11 MED ORDER — HYDROCORTISONE NA SUCCINATE PF 100 MG IJ SOLR
50.0000 mg | Freq: Four times a day (QID) | INTRAMUSCULAR | Status: DC
  Administered 2013-07-11 – 2013-07-12 (×4): 50 mg via INTRAVENOUS
  Filled 2013-07-11 (×8): qty 1

## 2013-07-11 MED ORDER — SODIUM CHLORIDE 0.9 % IV SOLN
1.0000 g | Freq: Two times a day (BID) | INTRAVENOUS | Status: AC
  Administered 2013-07-11 (×2): 1 g via INTRAVENOUS
  Filled 2013-07-11 (×3): qty 10

## 2013-07-11 MED ORDER — IOHEXOL 350 MG/ML SOLN
100.0000 mL | Freq: Once | INTRAVENOUS | Status: AC | PRN
  Administered 2013-07-11: 100 mL via INTRAVENOUS

## 2013-07-11 MED ORDER — INSULIN ASPART 100 UNIT/ML ~~LOC~~ SOLN: 0.0000 [IU] | SUBCUTANEOUS | Status: DC

## 2013-07-11 MED ORDER — FREE WATER
140.0000 mL | Freq: Four times a day (QID) | Status: DC
  Administered 2013-07-11 – 2013-07-16 (×16): 140 mL

## 2013-07-11 MED ORDER — LOPERAMIDE HCL 2 MG PO CAPS: 2.0000 mg | ORAL_CAPSULE | Freq: Four times a day (QID) | ORAL | Status: DC | PRN

## 2013-07-11 NOTE — Progress Notes (Signed)
Name: Candida PeelingLora Lufkin MRN: [id number] DOB: 08/23/1960    ADMISSION DATE:  06/19/2013 CONSULTATION DATE:  07/10/2013  [facility]  CHIEF COMPLAINT:  Hypotension  BRIEF PATIENT DESCRIPTION: 53 year old female with neuro and psych history presented 1/21 with AMS. PCCM asked to see for hypotension on 2/11.   PMH of peripheral neuropathy, seizures, catatonia, anxiety, HTN, and fibromyalgia.  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 No evidence of malignancy or other significant abnormality of the chest 1/24 - MRI C-spine > No evidence of cervical disc herniation. Minimal degenerative changes upper thoracic spine 1/27 - J tube placed 2/3 - Small bowel resection and J-tube revision. 2/11 - Abd xray > Persistent ileus 2/12 Hgb 6.2. 2 units PRBCs ordered 2/12 Stress dose steroids  LINES / TUBES: R Permacath 1/22 >> 2/12 R PICC 1/20 >>  Jtube 1/27 >>    CULTURES: C-Diff 1/21 > Neg Stool 1/21 >> + rotavirus  C-diff 1/25 > Neg Urine 1/25 > Neg Blood 2/4 > Neg C-Diff 2/7 > Neg C-diff 2/11 >>>neg Blood 1/25 > Neg Blood 2/11 >>> GNRs >>  Urine 2/11 >> GNRs, yeast >>     ANTIBIOTICS: Vanc 2/11 >> 2.12 Aztreonam 2/11 >>  Ciprofloxacin 2/12 >>    SUBJECTIVE:  RASS 0 to -1. Unintelligible speech. + F/C   VITAL SIGNS: Temp:  [97.5 F (36.4 C)-98.4 F (36.9 C)] 98 F (36.7 C) (02/12 1515) Pulse Rate:  [111-142] 142 (02/12 1515) Resp:  [0-32] 30 (02/12 1515) BP: (76-161)/(23-123) 120/52 mmHg (02/12 1515) SpO2:  [99 %-100 %] 100 % (02/12 1515) Weight:  [84.3 kg (185 lb 13.6 oz)] 84.3 kg (185 lb 13.6 oz) (02/12 0449) HEMODYNAMICS: CVP:  [2 mmHg-6 mmHg] 6 mmHg VENTILATOR SETTINGS:   INTAKE / OUTPUT: Intake/Output     02/11 0701 - 02/12 0700 02/12 0701 - 02/13 0700   I.V. (mL/kg) 3635.8 (43.1) 731.5 (8.7)   Blood  335   Other 840    NG/GT  300   IV Piggyback  1000 1170   Total Intake(mL/kg) 5475.8 (65) 2536.5 (30.1)   Urine (mL/kg/hr) 500 (0.2)    Stool 700 (0.3)    Total Output 1200     Net +4275.8 +2536.5        Urine Occurrence 5 x 4 x     PHYSICAL EXAMINATION: General: NAD Neuro: diffusely porfoundly weak  HEENT: WNL Cardiovascular: Regular, no M Lungs: clear anteriorly Abdomen:  Soft, non-tender, non-distended Ext: muscle wasting, warm, symmetric edema  LABS: I have reviewed all of today's lab results. Relevant abnormalities are discussed in the A/P section   CXR: ill defined AS dz in RUL and RLL  ASSESSMENT / PLAN:  PULMONARY A: Mild hypoxemia Vague multilobar infiltrates - PNA vs atx P:   Cont supplemental O2 Monitor CXR intermittently  CARDIOVASCULAR A:  Shock - septic. Possible component of adrenal insuff Tachycardia P:  Cont phenylephrine Begin stress dose steroids 2/12  RENAL A:   Hypokalemia P:   Monitor BMET intermittently Monitor I/Os Correct electrolytes as indicated   GASTROINTESTINAL A:   Malnutrition Hx gastric bypass Recent rotavirus colitis - likely resolved P:   SUP: enteral PPI Cont TFs  HEMATOLOGIC A:   Anemia Thrombocytopenia  P:  Follow CBC Transfuse per ICU guidelines - 2 units ordered 2/12  INFECTIOUS A:  Severe sepsis GNR bacteremia - source likely urinary  tract vs lungs P:   Micro and abx as above  ENDOCRINE A:   Suspected adrenal insuff (received high dose steroids earlier in hosp)  P:   Stress dose hydrocortisone begun 2/12 SSI coverage in setting of steroids  NEUROLOGIC A:   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   TODAY'S SUMMARY:     I have personally obtained a history, examined the patient, evaluated laboratory and imaging results, formulated  the assessment and plan and placed orders.  CRITICAL CARE: The patient is critically ill with multiple organ systems failure and requires high complexity decision making for assessment and support, frequent evaluation and titration of therapies, application of advanced monitoring technologies and extensive interpretation of multiple databases. Critical Care Time devoted to patient care services described in this note is 40 minutes.   Billy Fischer, MD ; [facility] [phone].  After 5:30 PM or weekends, call [phone]

## 2013-07-11 NOTE — Progress Notes (Addendum)
CRITICAL VALUE ALERT  Critical value received:  Blood culture (aerobic): + gram neg. rods  Date of notification:07/11/13  Time of notification: 0300  Critical value read back:yes  Nurse who received alert:  Jeanice LimHolly, RN  MD notified (1st page):  CCM MD  Time of first page: 0310  MD notified (2nd page):  Time of second page:  Responding MD: Dr. Darrick Pennaeterding  Time MD responded:

## 2013-07-11 NOTE — Consult Note (Addendum)
ANTIBIOTIC CONSULT NOTE - FOLLOW UP  Pharmacy Consult for Cipro/Aztreonam Indication: sepsis, ? Adominal source, bacteremia  Allergies  Allergen Reactions  . Morphine And Related Other (See Comments)    REACTION: Causes pain  . Paroxetine Hcl Other (See Comments)    REACTION:  unknown  . Penicillins Other (See Comments)    REACTION: Makes her body hurt.  . Sertraline Hcl Other (See Comments)    REACTION:  unknown    Patient Measurements: Height: 5\' 3"  (160 cm) Weight: 185 lb 13.6 oz (84.3 kg) IBW/kg (Calculated) : 52.4  Vital Signs: Temp: 98.4 F (36.9 C) (02/12 1130) Temp src: Oral (02/12 1130) BP: 117/76 mmHg (02/12 0800) Pulse Rate: 130 (02/12 1130) Intake/Output from previous day: 02/11 0701 - 02/12 0700 In: 5475.8 [I.V.:3635.8; IV Piggyback:1000] Out: 1200 [Urine:500; Stool:700] Intake/Output from this shift: Total I/O In: 225 [I.V.:125; IV Piggyback:100] Out: -   Labs:  Recent Labs  07/09/13 1055 07/10/13 0540 07/11/13 0500  WBC 8.1 4.9 7.8  HGB 8.6* 8.4* 6.2*  PLT 106* 93* 96*  CREATININE 0.61 0.55 0.37*   Estimated Creatinine Clearance: 85.6 ml/min (by C-G formula based on Cr of 0.37).  Microbiology: Recent Results (from the past 720 hour(s))  URINE CULTURE     Status: None   Collection Time    06/13/13  7:13 PM      Result Value Ref Range Status   Specimen Description URINE, CATHETERIZED   Final   Special Requests NONE   Final   Culture  Setup Time     Final   Value: 06/14/2013 02:33     Performed at [facility] Count     Final   Value: NO GROWTH     Performed at [facility]   Culture     Final   Value: NO GROWTH     Performed at [facility]   Report Status 06/14/2013 FINAL   Final  CLOSTRIDIUM DIFFICILE BY PCR     Status: None   Collection Time    06/19/13  5:14 PM      Result Value Ref Range Status   C difficile by pcr NEGATIVE  NEGATIVE Final  MRSA PCR SCREENING     Status: None   Collection  Time    06/19/13  5:14 PM      Result Value Ref Range Status   MRSA by PCR NEGATIVE  NEGATIVE Final   Comment:            The GeneXpert MRSA Assay (FDA     approved for NASAL specimens     only), is one component of a     comprehensive MRSA colonization     surveillance program. It is not     intended to diagnose MRSA     infection nor to guide or     monitor treatment for     MRSA infections.  STOOL CULTURE     Status: None   Collection Time    06/19/13  5:14 PM      Result Value Ref Range Status   Specimen Description STOOL   Final   Special Requests NONE   Final   Culture     Final   Value: NO SALMONELLA, SHIGELLA, CAMPYLOBACTER, YERSINIA, OR E.COLI 0157:H7 ISOLATED     Performed at [facility]   Report Status 06/23/2013 FINAL   Final  CULTURE, BLOOD (ROUTINE X 2)     Status: None  Collection Time    06/23/13  2:02 PM      Result Value Ref Range Status   Specimen Description BLOOD LEFT ARM   Final   Special Requests BOTTLES DRAWN AEROBIC ONLY 10CC   Final   Culture  Setup Time     Final   Value: 06/23/2013 19:55     Performed at [facility]   Culture     Final   Value: NO GROWTH 5 DAYS     Performed at [facility]   Report Status 06/29/2013 FINAL   Final  CULTURE, BLOOD (ROUTINE X 2)     Status: None   Collection Time    06/23/13  2:14 PM      Result Value Ref Range Status   Specimen Description BLOOD LEFT HAND   Final   Special Requests BOTTLES DRAWN AEROBIC ONLY 5.5 CC   Final   Culture  Setup Time     Final   Value: 06/23/2013 19:55     Performed at [facility]   Culture     Final   Value: NO GROWTH 5 DAYS     Performed at [facility]   Report Status 06/29/2013 FINAL   Final  CLOSTRIDIUM DIFFICILE BY PCR     Status: None   Collection Time    06/23/13  5:08 PM      Result Value Ref Range Status   C difficile by pcr NEGATIVE  NEGATIVE Final  URINE CULTURE     Status: None   Collection Time    06/23/13  6:15 PM       Result Value Ref Range Status   Specimen Description URINE, CLEAN CATCH   Final   Special Requests NONE   Final   Culture  Setup Time     Final   Value: 06/24/2013 01:22     Performed at [facility] Count     Final   Value: NO GROWTH     Performed at [facility]   Culture     Final   Value: NO GROWTH     Performed at [facility]   Report Status 06/24/2013 FINAL   Final  CULTURE, BLOOD (ROUTINE X 2)     Status: None   Collection Time    07/03/13  6:40 AM      Result Value Ref Range Status   Specimen Description BLOOD LEFT HAND   Final   Special Requests BOTTLES DRAWN AEROBIC ONLY 2CC   Final   Culture  Setup Time     Final   Value: 07/03/2013 13:19     Performed at [facility]   Culture     Final   Value: NO GROWTH 5 DAYS     Performed at [facility]   Report Status 07/09/2013 FINAL   Final  CULTURE, BLOOD (ROUTINE X 2)     Status: None   Collection Time    07/03/13  6:55 AM      Result Value Ref Range Status   Specimen Description BLOOD LEFT ARM   Final   Special Requests BOTTLES DRAWN AEROBIC ONLY 1CC   Final   Culture  Setup Time     Final   Value: 07/03/2013 13:19     Performed at [facility]   Culture     Final   Value: NO GROWTH 5 DAYS     Performed at [facility]  Report Status 07/09/2013 FINAL   Final  CLOSTRIDIUM DIFFICILE BY PCR     Status: None   Collection Time    07/06/13 10:27 AM      Result Value Ref Range Status   C difficile by pcr NEGATIVE  NEGATIVE Final  URINE CULTURE     Status: None   Collection Time    07/10/13  8:15 AM      Result Value Ref Range Status   Specimen Description URINE, CATHETERIZED   Final   Special Requests NONE   Final   Culture  Setup Time     Final   Value: 07/10/2013 09:13     Performed at [facility] Count     Final   Value: 50,000 COLONIES/ML     Performed at [facility]   Culture     Final   Value: GRAM NEGATIVE  RODS     YEAST     Performed at [facility]   Report Status PENDING   Incomplete  CLOSTRIDIUM DIFFICILE BY PCR     Status: None   Collection Time    07/10/13  8:33 AM      Result Value Ref Range Status   C difficile by pcr NEGATIVE  NEGATIVE Final  CULTURE, BLOOD (ROUTINE X 2)     Status: None   Collection Time    07/10/13 10:55 AM      Result Value Ref Range Status   Specimen Description BLOOD LEFT ARM   Final   Special Requests BOTTLES DRAWN AEROBIC ONLY 8CC   Final   Culture  Setup Time     Final   Value: 07/10/2013 16:23     Performed at [facility]   Culture     Final   Value: GRAM NEGATIVE RODS     Note: Gram Stain Report Called to,Read Back By and Verified With: HOLLY CHURCH 07/11/13 AT 0330 RIDK     Performed at [facility]   Report Status PENDING   Incomplete  MRSA PCR SCREENING     Status: None   Collection Time    07/10/13 12:55 PM      Result Value Ref Range Status   MRSA by PCR NEGATIVE  NEGATIVE Final   Comment:            The GeneXpert MRSA Assay (FDA     approved for NASAL specimens     only), is one component of a     comprehensive MRSA colonization     surveillance program. It is not     intended to diagnose MRSA     infection nor to guide or     monitor treatment for     MRSA infections.    Medical History: Past Medical History  Diagnosis Date  . Peripheral neuropathy   . Fibromyalgia   . Hypertension   . Anxiety   . Catatonia   . Seizures    Assessment: 52yom from SNF admitted 1/21 with AMS. PCCM consulted for fever and hypotension. Patient was started on empiric vancomycin and aztreonam for probable sepsis with ? 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 Of note, patient has a listed PCN allergy - though not likely a true allergy as her "reaction" is more of an intolerance, and she has tolerated Zosyn.  Blood and urine cultures reveal gram negative rods. Vancomycin has been discontinued. Pharmacy is now  consulted to dose ciprofloxacin for bacteremia. Aztreonam  remains on board as well. Ciprofloxacin is providing double coverage against Pseudomonas. Cultures should be followed for species determination to allow for de-escalation of antibiotics.  SCr 0.37. WBC wnl. Patient is afebrile.  1/25 zosyn>>1/28 2/11 vanc>> 2/12 2/11 azactam>> 2/12 cipro >>  2/11 cdiff PCR>> negative 2/11 ucx>> 50,000 colonies GNRs/yeast 2/11 blood x2 >> GNRs  Goal of Therapy:  Appropriate antibiotic dosing. Eradication of infection  Plan:  1) Begin ciprofloxacin 400 mg IV q 12 h 2) Continue Azactam 1g IV q8h 3) Follow renal function, cultures, LOT, clinical progression 4) Suggest changing Azactam to Zosyn - patient has tolerated zosyn in this admission, and likely documented allergy is not a true allergy  Yago Ludvigsen C. Nechemia Chiappetta, PharmD Clinical Pharmacist-Resident Pager: [phone] Pharmacy: [phone] 07/11/2013 12:00 PM

## 2013-07-11 NOTE — Progress Notes (Signed)
Patient ID: Joan [name], female   DOB: 20-Apr-1961, 53 y.o.   MRN: [id number]   Subjective: Pt complains of nausea.  [name] vomiting.  Apparently she vomited yesterday, not sure if it is bilious or TF.  Had 357m of liquid stool overnight per nursing.  TF have been stopped.     Objective:  Vital signs:  Filed Vitals:   07/11/13 0400 07/11/13 0449 07/11/13 0500 07/11/13 0600  BP: 129/61  97/58 117/68  Pulse: 124  121 112  Temp: 97.6 F (36.4 C)     TempSrc: Oral     Resp: 15  18 15   Height:      Weight:  185 lb 13.6 oz (84.3 kg)    SpO2: 100%  100% 100%    Last BM Date:  (Flexiseal)  Intake/Output   Yesterday:  02/11 0701 - 02/12 0700 In: 4380.8 [I.V.:3490.8; IV Piggyback:50] Out: 1200 [Urine:500; Stool:700] This shift:    I/O last 3 completed shifts: In: 4400.8 [I.V.:3500.8; Other:850; IV Piggyback:50] Out: 1200 [Urine:500; Stool:700]   Physical Exam: General: pt awake, alert and oriented to place. Abdomen: Soft.  Nondistended. Mild generalized tenderness.  Midline incision is healing well.  J tube in place.   [name] evidence of peritonitis.  [name] incarcerated hernias.  Problem List:   Principal Problem:   Acute encephalopathy Active Problems:   Convulsions/seizures   Toxic metabolic encephalopathy   Psychosis   Nausea with vomiting   Malfunction of jejunostomy tube   S/P small bowel resection    Results:   Labs: Results for orders placed during the hospital encounter of 06/19/13 (from the past 48 hour(s))  GLUCOSE, CAPILLARY     Status: None   Collection Time    07/09/13  7:51 AM      Result Value Ref Range   Glucose-Capillary 79  70 - 99 mg/dL  BASIC METABOLIC PANEL     Status: Abnormal   Collection Time    07/09/13 10:55 AM      Result Value Ref Range   Sodium 142  137 - 147 mEq/L   Potassium 4.6  3.7 - 5.3 mEq/L   Chloride 107  96 - 112 mEq/L   CO2 24  19 - 32 mEq/L   Glucose, Bld 98  70 - 99 mg/dL   BUN <3 (*) 6 - 23 mg/dL   Creatinine, Ser 0.61  0.50 -  1.10 mg/dL   Calcium 7.7 (*) 8.4 - 10.5 mg/dL   GFR calc non Af Amer >90  >90 mL/min   GFR calc Af Amer >90  >90 mL/min   Comment: (NOTE)     The eGFR has been calculated using the CKD EPI equation.     This calculation has not been validated in all clinical situations.     eGFR's persistently <90 mL/min signify possible Chronic Kidney     Disease.  CBC     Status: Abnormal   Collection Time    07/09/13 10:55 AM      Result Value Ref Range   WBC 8.1  4.0 - 10.5 K/uL   RBC 2.70 (*) 3.87 - 5.11 MIL/uL   Hemoglobin 8.6 (*) 12.0 - 15.0 g/dL   HCT 26.7 (*) 36.0 - 46.0 %   MCV 98.9  78.0 - 100.0 fL   MCH 31.9  26.0 - 34.0 pg   MCHC 32.2  30.0 - 36.0 g/dL   RDW 17.3 (*) 11.5 - 15.5 %   Platelets 106 (*) 150 - 400 K/uL  Comment: CONSISTENT WITH PREVIOUS RESULT  BASIC METABOLIC PANEL     Status: Abnormal   Collection Time    07/10/13  5:40 AM      Result Value Ref Range   Sodium 137  137 - 147 mEq/L   Potassium 4.4  3.7 - 5.3 mEq/L   Chloride 101  96 - 112 mEq/L   CO2 22  19 - 32 mEq/L   Glucose, Bld 103 (*) 70 - 99 mg/dL   BUN 4 (*) 6 - 23 mg/dL   Creatinine, Ser 0.55  0.50 - 1.10 mg/dL   Calcium 7.5 (*) 8.4 - 10.5 mg/dL   GFR calc non Af Amer >90  >90 mL/min   GFR calc Af Amer >90  >90 mL/min   Comment: (NOTE)     The eGFR has been calculated using the CKD EPI equation.     This calculation has not been validated in all clinical situations.     eGFR's persistently <90 mL/min signify possible Chronic Kidney     Disease.  CBC     Status: Abnormal   Collection Time    07/10/13  5:40 AM      Result Value Ref Range   WBC 4.9  4.0 - 10.5 K/uL   RBC 2.64 (*) 3.87 - 5.11 MIL/uL   Hemoglobin 8.4 (*) 12.0 - 15.0 g/dL   HCT 25.8 (*) 36.0 - 46.0 %   MCV 97.7  78.0 - 100.0 fL   MCH 31.8  26.0 - 34.0 pg   MCHC 32.6  30.0 - 36.0 g/dL   RDW 17.0 (*) 11.5 - 15.5 %   Platelets 93 (*) 150 - 400 K/uL   Comment: CONSISTENT WITH PREVIOUS RESULT  LACTIC ACID, PLASMA     Status: Abnormal    Collection Time    07/10/13  7:54 AM      Result Value Ref Range   Lactic Acid, Venous 2.3 (*) 0.5 - 2.2 mmol/L  GLUCOSE, CAPILLARY     Status: None   Collection Time    07/10/13  8:10 AM      Result Value Ref Range   Glucose-Capillary 93  70 - 99 mg/dL  URINALYSIS, ROUTINE W REFLEX MICROSCOPIC     Status: Abnormal   Collection Time    07/10/13  8:15 AM      Result Value Ref Range   Color, Urine YELLOW  YELLOW   APPearance CLEAR  CLEAR   Specific Gravity, Urine 1.018  1.005 - 1.030   pH 5.0  5.0 - 8.0   Glucose, UA NEGATIVE  NEGATIVE mg/dL   Hgb urine dipstick NEGATIVE  NEGATIVE   Bilirubin Urine NEGATIVE  NEGATIVE   Ketones, ur NEGATIVE  NEGATIVE mg/dL   Protein, ur NEGATIVE  NEGATIVE mg/dL   Urobilinogen, UA 1.0  0.0 - 1.0 mg/dL   Nitrite NEGATIVE  NEGATIVE   Leukocytes, UA SMALL (*) NEGATIVE  URINE MICROSCOPIC-ADD ON     Status: None   Collection Time    07/10/13  8:15 AM      Result Value Ref Range   Squamous Epithelial / LPF RARE  RARE   WBC, UA 3-6  <3 WBC/hpf   Bacteria, UA RARE  RARE   Urine-Other FEW YEAST     Comment: MUCOUS PRESENT  CLOSTRIDIUM DIFFICILE BY PCR     Status: None   Collection Time    07/10/13  8:33 AM      Result Value Ref Range   C  difficile by pcr NEGATIVE  NEGATIVE  PROCALCITONIN     Status: None   Collection Time    07/10/13 10:45 AM      Result Value Ref Range   Procalcitonin 2.97     Comment:            Interpretation:     PCT > 2 ng/mL:     Systemic infection (sepsis) is likely,     unless other causes are known.     (NOTE)             ICU PCT Algorithm               Non ICU PCT Algorithm        ----------------------------     ------------------------------             PCT < 0.25 ng/mL                 PCT < 0.1 ng/mL         Stopping of antibiotics            Stopping of antibiotics           strongly encouraged.               strongly encouraged.        ----------------------------     ------------------------------            PCT level decrease by               PCT < 0.25 ng/mL           >= 80% from peak PCT           OR PCT 0.25 - 0.5 ng/mL          Stopping of antibiotics                                                 encouraged.         Stopping of antibiotics               encouraged.        ----------------------------     ------------------------------           PCT level decrease by              PCT >= 0.25 ng/mL           < 80% from peak PCT            AND PCT >= 0.5 ng/mL            Continuing antibiotics                                                  encouraged.           Continuing antibiotics                encouraged.        ----------------------------     ------------------------------         PCT level increase compared          PCT > 0.5 ng/mL  with peak PCT AND              PCT >= 0.5 ng/mL             Escalation of antibiotics                                              strongly encouraged.          Escalation of antibiotics            strongly encouraged.  VALPROIC ACID LEVEL     Status: None   Collection Time    07/10/13 10:45 AM      Result Value Ref Range   Valproic Acid Lvl 62.5  50.0 - 100.0 ug/mL  CULTURE, BLOOD (ROUTINE X 2)     Status: None   Collection Time    07/10/13 10:55 AM      Result Value Ref Range   Specimen Description BLOOD LEFT ARM     Special Requests BOTTLES DRAWN AEROBIC ONLY 8CC     Culture  Setup Time       Value: 07/10/2013 16:23     Performed at [facility]   Culture       Value: [place]     Note: Gram Stain Report Called to,Read Back By and Verified With: [place] 07/11/13 AT [place]     Performed at [facility]   Report Status PENDING    LACTIC ACID, PLASMA     Status: None   Collection Time    07/10/13 11:51 AM      Result Value Ref Range   Lactic Acid, Venous 1.7  0.5 - 2.2 mmol/L  MRSA PCR SCREENING     Status: None   Collection Time    07/10/13 12:55 PM      Result Value Ref Range   MRSA by  PCR NEGATIVE  NEGATIVE   Comment:            The GeneXpert MRSA Assay (FDA     approved for NASAL specimens     only), is one component of a     comprehensive MRSA colonization     surveillance program. It is not     intended to diagnose MRSA     infection nor to guide or     monitor treatment for     MRSA infections.  GLUCOSE, CAPILLARY     Status: None   Collection Time    07/10/13  8:25 PM      Result Value Ref Range   Glucose-Capillary 81  70 - 99 mg/dL  GLUCOSE, CAPILLARY     Status: None   Collection Time    07/10/13 11:47 PM      Result Value Ref Range   Glucose-Capillary 81  70 - 99 mg/dL  GLUCOSE, CAPILLARY     Status: None   Collection Time    07/11/13  3:58 AM      Result Value Ref Range   Glucose-Capillary 71  70 - 99 mg/dL  BASIC METABOLIC PANEL     Status: Abnormal   Collection Time    07/11/13  5:00 AM      Result Value Ref Range   Sodium 134 (*) 137 - 147 mEq/L   Potassium 2.5 (*) 3.7 - 5.3 mEq/L   Comment: CRITICAL RESULT  CALLED TO, READ BACK BY AND VERIFIED WITH:     CHURCH,H RN 07/11/2013 0605 J[place]ANS     REPEATED TO VERIFY   Chloride 108  96 - 112 mEq/L   CO2 16 (*) 19 - 32 mEq/L   Glucose, Bld 306 (*) 70 - 99 mg/dL   BUN 5 (*) 6 - 23 mg/dL   Creatinine, Ser 0.37 (*) 0.50 - 1.10 mg/dL   Calcium 5.0 (*) 8.4 - 10.5 mg/dL   Comment: CRITICAL RESULT CALLED TO, READ BACK BY AND VERIFIED WITH:     CHURCH,H RN 07/11/2013 0605 J[place]ANS     REPEATED TO VERIFY   GFR calc non Af Amer >90  >90 mL/min   GFR calc Af Amer >90  >90 mL/min   Comment: (NOTE)     The eGFR has been calculated using the CKD EPI equation.     This calculation has not been validated in all clinical situations.     eGFR's persistently <90 mL/min signify possible Chronic Kidney     Disease.  CBC     Status: Abnormal   Collection Time    07/11/13  5:00 AM      Result Value Ref Range   WBC 7.8  4.0 - 10.5 K/uL   RBC 1.90 (*) 3.87 - 5.11 MIL/uL   Hemoglobin 6.2 (*) 12.0 - 15.0 g/dL    Comment: DELTA CHECK NOTED     REPEATED TO VERIFY     CRITICAL RESULT CALLED TO, READ BACK BY AND VERIFIED WITH:     Phineas Inches (RN) [phone] 07/11/2013 L. [place]   HCT 18.7 (*) 36.0 - 46.0 %   MCV 98.4  78.0 - 100.0 fL   MCH 32.6  26.0 - 34.0 pg   MCHC 33.2  30.0 - 36.0 g/dL   RDW 17.4 (*) 11.5 - 15.5 %   Platelets 96 (*) 150 - 400 K/uL   Comment: CONSISTENT WITH PREVIOUS RESULT  PREPARE RBC (CROSSMATCH)     Status: None   Collection Time    07/11/13  5:55 AM      Result Value Ref Range   Order Confirmation ORDER PROCESSED BY BLOOD BANK    TYPE AND SCREEN     Status: None   Collection Time    07/11/13  5:55 AM      Result Value Ref Range   ABO/RH(D) A POS     Antibody Screen NEG     Sample Expiration 07/14/2013     Unit Number [id number]     Blood Component Type RED CELLS,LR     Unit division 00     Status of Unit ALLOCATED     Transfusion Status OK TO TRANSFUSE     Crossmatch Result Compatible      Imaging / Studies: Ct Abdomen Pelvis Wo Contrast  07/10/2013   CLINICAL DATA:  Sepsis question intra-abdominal source  EXAM: CT ABDOMEN AND PELVIS WITHOUT CONTRAST  TECHNIQUE: Multidetector CT imaging of the abdomen and pelvis was performed following the standard protocol without intravenous contrast. Sagittal and coronal MPR images reconstructed from axial data set. GI contrast administered  COMPARISON:  CT pelvis 05/17/2013; CT abdomen and pelvis 06/02/2011  FINDINGS: Small moderate-sized right pleural effusion with basilar atelectasis.  Minimal atelectasis left base.  Gallbladder surgically absent.  Prior gastric surgery.  Jejunostomy tube left mid abdomen, through which GI contrast was administered.  Dilated small bowel loops in the upper abdomen terminating at an anastomotic staple line in the left mid  abdomen most compatible with small bowel obstruction ; the lack of dilatation of the remaining small bowel and colon makes ileus much less likely.  Small bowel measures up to 4.8 cm  transverse mild bowel wall thickening of the dilated loops.  Distal small bowel is decompressed and unremarkable.  Small foci of gas in the left mid abdomen into the upper left pelvis appear to be within several small bowel loops which are nondistended and [name] definite free intraperitoneal air is identified.  Decompressed colon containing contrast and a rectal tube.  Unremarkable bladder and ovaries with surgical absence of uterus.  Small amount of free pelvic fluid.  Diffuse soft tissue edema/ anasarca.  [name] mass, adenopathy, or hernia.  Osseous structures unremarkable.  IMPRESSION: Dilated proximal small bowel loops proximal to the jejunostomy site most consistent with small bowel obstruction, associated with mild bowel wall thickening and duodenal dilatation.  Decompressed distal small bowel and colon.  Small amount of nonspecific free pelvic fluid.   Electronically Signed   By: Lavonia Dana M.D.   On: 07/10/2013 17:38   Dg Chest Port 1 View  07/11/2013   CLINICAL DATA:  Shortness of breath, evaluate pulmonary infiltrates  EXAM: PORTABLE CHEST - 1 VIEW  COMPARISON:  DG CHEST 1V PORT dated 07/10/2013; DG CHEST 1V PORT dated 07/03/2013; CT CHEST W/CM dated 06/21/2013; IR FLUORO GUIDE CV LINE*R* dated 06/20/2013; DG CHEST 1V PORT dated 05/11/2013  FINDINGS: Grossly unchanged cardiac silhouette and mediastinal contours given patient rotation. Stable position of support apparatus. Grossly unchanged right mid lung heterogeneous airspace opacities with worsening bilateral medial basilar airspace opacities. [name] definite pleural effusion or pneumothorax. [name] evidence of edema. A minimal amount of enteric contrast is seen within in the gastric fundus. Post cholecystectomy. Grossly unchanged bones.  IMPRESSION: 1.  Stable positioning of support apparatus.  [name] pneumothorax. 2. Worsening bibasilar airspace disease, possibly atelectasis though progression of multifocal infection / aspiration is not excluded. A follow-up chest  radiograph in 4 to 6 weeks after treatment is recommended to ensure resolution.   Electronically Signed   By: Sandi Mariscal M.D.   On: 07/11/2013 07:47   Dg Chest Port 1 View  07/10/2013   CLINICAL DATA:  Shortness of breath.  Hypotension  EXAM: PORTABLE CHEST - 1 VIEW  COMPARISON:  DG CHEST 1V PORT dated 07/03/2013; CT CHEST W/CM dated 06/21/2013  FINDINGS: Right internal jugular dialysis catheter tip: Right atrium. Thoracic spondylosis noted. There is a band of airspace opacity in the right upper lobe just above the minor fissure. Improved aeration at the right lung base.  IMPRESSION: Improved aeration at the right lung base, with but there is a band of airspace opacity in the right upper lobe favoring atelectasis over pneumonia. Next item thoracic spondylosis.   Electronically Signed   By: Sherryl Barters M.D.   On: 07/10/2013 08:27   Dg Abd Portable 1v  07/10/2013   CLINICAL DATA:  Assess for ileus  EXAM: PORTABLE ABDOMEN - 1 VIEW  COMPARISON:  June 30, 2013  FINDINGS: There is persistent air-filled dilated small bowel loops in the left abdomen. Air is noted in the colon. Skin staples are projected over the right abdomen. Patient is status post prior cholecystectomy.  IMPRESSION: Persistent ileus.   Electronically Signed   By: Abelardo Diesel M.D.   On: 07/10/2013 02:30    Medications / Allergies: per chart  Antibiotics: Anti-infectives   Start     Dose/Rate Route Frequency Ordered Stop   07/10/13 2100  vancomycin (VANCOCIN) IVPB 750 mg/150 ml premix     750 mg 150 mL/hr over 60 Minutes Intravenous Every 8 hours 07/10/13 1215     07/10/13 1400  aztreonam (AZACTAM) 1 g in dextrose 5 % 50 mL IVPB     1 g 100 mL/hr over 30 Minutes Intravenous 3 times per day 07/10/13 1215     07/10/13 1230  vancomycin (VANCOCIN) IVPB 1000 mg/200 mL premix     1,000 mg 200 mL/hr over 60 Minutes Intravenous  Once 07/10/13 1215 07/10/13 1422   07/02/13 1330  ciprofloxacin (CIPRO) IVPB 400 mg     400 mg 200 mL/hr  over 60 Minutes Intravenous To Surgery 07/02/13 1321 07/02/13 1326   06/23/13 1400  piperacillin-tazobactam (ZOSYN) IVPB 3.375 g  Status:  Discontinued     3.375 g 12.5 mL/hr over 240 Minutes Intravenous 3 times per day 06/23/13 1336 06/26/13 1441   06/20/13 1200  vancomycin (VANCOCIN) IVPB 1000 mg/200 mL premix     1,000 mg 200 mL/hr over 60 Minutes Intravenous  Once 06/20/13 1146 06/20/13 1433       Assessment/Plan:  S/P J tube revision POD#9 -clinically does not appear obstructed.  Since this is a concern, stop paregoric.   Measure intake and output and resume if output is high.  Will obtain a a XR of abdomen.  Resume TF and see how she tolerates.   Hypokalemia-likely from GI losses.  Supplement with IV KCL.  Repeat labs in AM.  LOS: 21 days   Erby Pian, [facility] Surgery Pager [phone] Office [phone]  07/11/2013 8:20 AM

## 2013-07-11 NOTE — Progress Notes (Signed)
eLink Physician-Brief Progress Note Patient Name: Joan [name] DOB: 01/17/1961 MRN: [id number]  Date of Service  07/11/2013   HPI/Events of Note  Hgb down to 6.2 from 8.4   eICU Interventions  Plan: Transfuse 1 u pRBC Post-transfusion CBC   Intervention Category Intermediate Interventions: Bleeding - evaluation and treatment with blood products  DETERDING,ELIZABETH 07/11/2013, 5:35 AM

## 2013-07-11 NOTE — Progress Notes (Addendum)
Patient Demographics  Joan [name], is a 53 y.o. female, DOB - [birth year], XIP:[id number]  Admit date - 06/19/2013   Admitting Physician Charlynne Cousins, MD  Outpatient Primary MD for the patient is [name],[name], MD  LOS - 22   No chief complaint on file.       Assessment & Plan    Assumed care of the patient on 07/10/2013 on day 21 of her hospital stay   HPI/Subjective:  53 yr old female presents with a CC of Altered mental status. Pt has a PMH of peripheral neuropathy, seizures, catatonia, anxiety, HTN, and fibromyalgia. Pt was previously admitted on 12/13 thru 12/28 with toxic metabolic encephalopathy. 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 EEG showed no recurrent seizures. A 24 hour EEG showed an intermittent delta pattern felt to represent Frontal Intermittent Rhythmic Delta Activity. Pt was started on two antiepileptics.   This admission, she was again found altered and seemed "comatose" . Since d/c in December, pt has had persistent confusion and is confined to a wheelchair at [facility]. She is currently confused and experiencing visual hallucinations.She is s/p J tube placement and followed with intractable nausea, vomiting. Surgery following, . ON 2/3 night pt developed fever of 101.4, became tachycardic and hypotensive. CXR, done , UA ordered and blood cultures negative so far. Her work up so far negative for infection. H&H remains stable at 8.5 , and she was found to have mild leukocytosis.   Patient subsequently on 07/10/2013 was seen, she was found to be persistently hypotensive, tachycardic, febrile, despite 3 L of IV fluids her blood pressure stayed around 80 systolic, pulmonary critical care was consulted and patient was  transferred to step down unit. Possible sources of infection aspiration with hospital-acquired pneumonia, intra-abdominal sepsis due to recent J-tube reversal, dialysis catheter which has been placed about 2 weeks ago.       Assessment/Plan:   Sepsis on 07/10/2013 - she was found to be persistently hypotensive, tachycardic, febrile, was transferred to step down unit to 05/18/2014, critical care team was consulted, Gram -be on 1/2 B cultures, this likely is line infection vs Urological source, less likely  aspiration pneumonia - [facility] also had a recent intra-abdominal sepsis due to recent J-tube reversal, and had a dialysis catheter which has been placed about 2 weeks ago. Change foley 07-11-13.  Been placed on appropriate broad-spectrum antibiotics with improvement, dialysis catheter removed on 07/11/2013, blood pressure better with IV fluids, lactic acid improvement in critical care monitoring the patient also. General surgery on board for J-tube reversal surgery. Abdominal x-rays appear stable.     Anemia  Unclear etiology, s/p 2 unit s of prbc transfusion and started her on protonix . Her stool for occult blood negative in the last 2 weeks. Anemia panel showed severe iron deficiency .   We'll replace IV IM once she is stable from infection standpoint  She might require EGD once more stable, GI consulted recommended outpatient follow up with her GI physician at [facility].     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 Pt received diatech catheter on 06/20/2013 placement and received and  finished plasmapheresis returns, case discussed with neurologist Dr. Doy Mince on 07/11/2013. -MRI of the brain showed no acute abnormality.-Ammonia level is normal . [facility] paraneoplastic panel reported to be negative, MRI of the C-spine is negative for acute findings  - We'll continue to monitor with supportive care, so far  minimal improvement. - start PT-OT    Tachycardia  - Patient continues to have sinus tachycardia probably secondary to hypotension from volume depletion and fever.  - appears to be sinus tachy, will get 12 lead EKG .  -on po metoprolol.  - Stable echocardiogram and TSH.  - Will check CT angiogram to rule out PE.     Hx of Convulsions/Seizures  -Pt has a PMH of seizures and catatonia.  -MRI of the brain showed thalamic signal abnormality during previous admission. No longer present on current MRI.  -Pt currently taking Keppra and Depakene. Increased Depakene to 571m tid      Hypokalemia and hypocalcemia  - Repleted and recheck     Nausea&Vomiting  Started after tube feeds was initiated, which has resolved. Mild ileus on exam we'll continue to monitor. Continue zofran, phenergan prn  She underwent revision of the j tube per surgery and small bowel resection on 2/3. Ng tube was taken out and she was started on very low rate tube feeds, slowly increasing as per surgery recommendations. She continues to have diarrhea, c diff pcr negative, rota virus positive provide supportive care.      Hx of Anxiety  -Continue pt on Remeron  -Patient appears to have had a psychiatric history  -Current illness is bringing out psychiatric symptoms.  -Psychiatry consulted as psychotropic medications may help with her current symptoms.  -Psych prescribed Risperdal .5 mg bid  Ativan prn for anxiety.     Hypertension  -Pt has a PMH of HTN  -Likely secondary to anxiety  Continue metoprolol and prn hydralazine.     Malnutrition with history of Gastric Bypass  -Per SNF patient has had no PO intake for over 1 week prior to admission.  -SNF sent her to hospital for J tube placement.  -Gen Surg consulted, J-tube placed and underwent revision of j tube withexploratory laparotomy on 2/3 and small bowel resection.       Recent hx of c-diff (05/18/13)  -Pt negative for C-diff at this  admit      Thrombocytopenia  -No acute bleeding monitor     Enterovaginal fistula  -Reported prior to that this patient had some stools in her vagina.  -Discussed with radiology, no evidence of rectovaginal fistula and the previous imaging, (patient had indicated pelvic scan done previously).  - Consulted GI, and the did not feel that patient will benefit from colonoscopy for the diagnosis of fistula.  - Called and discussed with [place]call, and they recommend to follow the patient as outpatient. No further workup required in the hospital.    .      Code Status: full  Family Communication:    Disposition Plan:    SIGNIFICANT EVENTS / STUDIES:  1/21- admitted  1/23 - CT Chest . No evidence of malignancy or other significant abnormality of the chest  1/24 - MRI C-spine > No evidence of cervical disc herniation. Minimal degenerative changes upper thoracic spine  1/27 - J tube placed  2/3 - Small bowel resection and J-tube revision.  2/11 - Abd xray > Persistent ileus  07/05/2013 echocardiogram stable EF of 60% 11/16/2013 Lower extremity venous duplex no DVT   LINES /  TUBES:  R Permacath 1/22 >>>  R PICC 1/20 >>>  Jtube 1/27 >>>  Flexiseal    CULTURES:  C-Diff 1/21 > Neg  Stool 1/21 > Neg  C-diff 1/25 > Neg  Urine 1/25 > Neg  ______________  ______________  Blood 2/4 > Neg  C-Diff 2/7 > Neg  Blood 2/11 >>>ng  Urine 2/11 >>>  C-diff 2/11 >>>neg  Blood 1/25 > Neg Blood 2/11 > gram -ve rods    Consults  Gen. surgery, neurology, PC CM, GYN   Medications  Scheduled Meds: . aztreonam  1 g Intravenous 3 times per day  . calcium gluconate  1 g Intravenous BID  . chlorhexidine  15 mL Mouth Rinse BID  . ciprofloxacin  400 mg Intravenous Q12H  . dextrose      . enoxaparin (LOVENOX) injection  40 mg Subcutaneous Q24H  . free water  140 mL Per Tube QID  . hydrocortisone sod succinate (SOLU-CORTEF) inj  50 mg Intravenous Q6H  . insulin aspart  0-15 Units  Subcutaneous Q4H  . levETIRAcetam  1,000 mg Intravenous Q12H  . magnesium sulfate 1 - 4 g bolus IVPB  1 g Intravenous Once  . mirtazapine  15 mg Per Tube QHS  . pantoprazole sodium  40 mg Per Tube Q1200  . potassium chloride  10 mEq Intravenous Q1 Hr x 6  . potassium chloride  40 mEq Oral BID  . risperiDONE  0.5 mg Oral BID  . sodium chloride  10 mL Intravenous Q12H  . valproate sodium  500 mg Intravenous 3 times per day   Continuous Infusions: . sodium chloride 125 mL/hr (07/11/13 0257)  . citrate dextrose    . feeding supplement (VITAL AF 1.2 CAL)    . phenylephrine (NEO-SYNEPHRINE) Adult infusion 75 mcg/min (07/11/13 1100)   PRN Meds:.acetaminophen, acetaminophen, hydrALAZINE, HYDROmorphone (DILAUDID) injection, loperamide, ondansetron, promethazine, sodium chloride, sodium chloride  DVT Prophylaxis Lovenox   Lab Results  Component Value Date   PLT 96* 07/11/2013    Antibiotics    Anti-infectives   Start     Dose/Rate Route Frequency Ordered Stop   07/11/13 1230  ciprofloxacin (CIPRO) IVPB 400 mg     400 mg 200 mL/hr over 60 Minutes Intravenous Every 12 hours 07/11/13 1151     07/10/13 2100  vancomycin (VANCOCIN) IVPB 750 mg/150 ml premix  Status:  Discontinued     750 mg 150 mL/hr over 60 Minutes Intravenous Every 8 hours 07/10/13 1215 07/11/13 1103   07/10/13 1400  aztreonam (AZACTAM) 1 g in dextrose 5 % 50 mL IVPB     1 g 100 mL/hr over 30 Minutes Intravenous 3 times per day 07/10/13 1215     07/10/13 1230  vancomycin (VANCOCIN) IVPB 1000 mg/200 mL premix     1,000 mg 200 mL/hr over 60 Minutes Intravenous  Once 07/10/13 1215 07/10/13 1422   07/02/13 1330  ciprofloxacin (CIPRO) IVPB 400 mg     400 mg 200 mL/hr over 60 Minutes Intravenous To Surgery 07/02/13 1321 07/02/13 1326   06/23/13 1400  piperacillin-tazobactam (ZOSYN) IVPB 3.375 g  Status:  Discontinued     3.375 g 12.5 mL/hr over 240 Minutes Intravenous 3 times per day 06/23/13 1336 06/26/13 1441   06/20/13  1200  vancomycin (VANCOCIN) IVPB 1000 mg/200 mL premix     1,000 mg 200 mL/hr over 60 Minutes Intravenous  Once 06/20/13 1146 06/20/13 1433          Subjective:   Joan [name] today  is in hospital bed, does not appear to be in any discomfort however appears febrile, unable to answer questions  Objective:   Filed Vitals:   07/11/13 1100 07/11/13 1130 07/11/13 1145 07/11/13 1200  BP: 121/57  125/75 129/83  Pulse: 126 130  130  Temp:  98.4 F (36.9 C) 98.4 F (36.9 C)   TempSrc:  Oral Oral   Resp: _0 Height:      Weight:      SpO2: 100% 100%  100%    Wt Readings from Last 3 Encounters:  07/11/13 84.3 kg (185 lb 13.6 oz)  07/11/13 84.3 kg (185 lb 13.6 oz)  07/11/13 84.3 kg (185 lb 13.6 oz)     Intake/Output Summary (Last 24 hours) at 07/11/13 1230 Last data filed at 07/11/13 1100  Gross per 24 hour  Intake 6440.3 ml  Output      0 ml  Net 6440.3 ml     Physical Exam  Awake, not alert, No new F.N deficits, Normal affect [place].AT,PERRAL Supple Neck,No JVD, No cervical lymphadenopathy appriciated.  Symmetrical Chest wall movement, Good air movement bilaterally, CTAB RRR,No Gallops,Rubs or new Murmurs, No Parasternal Heave +ve B.Sounds, Abd Soft has PEG tube in place, recent surgery scar stable with staples, Non tender, No organomegaly appriciated, No rebound - guarding or rigidity. No Cyanosis, Clubbing or edema, No new Rash or bruise      Data Review   Micro Results Recent Results (from the past 240 hour(s))  CULTURE, BLOOD (ROUTINE X 2)     Status: None   Collection Time    07/03/13  6:40 AM      Result Value Ref Range Status   Specimen Description BLOOD LEFT HAND   Final   Special Requests BOTTLES DRAWN AEROBIC ONLY 2CC   Final   Culture  Setup Time     Final   Value: 07/03/2013 13:19     Performed at [facility]   Culture     Final   Value: NO GROWTH 5 DAYS     Performed at [facility]   Report Status 07/09/2013 FINAL   Final    CULTURE, BLOOD (ROUTINE X 2)     Status: None   Collection Time    07/03/13  6:55 AM      Result Value Ref Range Status   Specimen Description BLOOD LEFT ARM   Final   Special Requests BOTTLES DRAWN AEROBIC ONLY 1CC   Final   Culture  Setup Time     Final   Value: 07/03/2013 13:19     Performed at [facility]   Culture     Final   Value: NO GROWTH 5 DAYS     Performed at [facility]   Report Status 07/09/2013 FINAL   Final  CLOSTRIDIUM DIFFICILE BY PCR     Status: None   Collection Time    07/06/13 10:27 AM      Result Value Ref Range Status   C difficile by pcr NEGATIVE  NEGATIVE Final  URINE CULTURE     Status: None   Collection Time    07/10/13  8:15 AM      Result Value Ref Range Status   Specimen Description URINE, CATHETERIZED   Final   Special Requests NONE   Final   Culture  Setup Time     Final   Value: 07/10/2013 09:13     Performed at [facility]  Colony Count     Final   Value: 50,000 COLONIES/ML     Performed at [facility]   Culture     Final   Value: GRAM NEGATIVE RODS     YEAST     Performed at [facility]   Report Status PENDING   Incomplete  CLOSTRIDIUM DIFFICILE BY PCR     Status: None   Collection Time    07/10/13  8:33 AM      Result Value Ref Range Status   C difficile by pcr NEGATIVE  NEGATIVE Final  CULTURE, BLOOD (ROUTINE X 2)     Status: None   Collection Time    07/10/13 10:55 AM      Result Value Ref Range Status   Specimen Description BLOOD LEFT ARM   Final   Special Requests BOTTLES DRAWN AEROBIC ONLY 8CC   Final   Culture  Setup Time     Final   Value: 07/10/2013 16:23     Performed at [facility]   Culture     Final   Value: GRAM NEGATIVE RODS     Note: Gram Stain Report Called to,Read Back By and Verified With: [place] 07/11/13 AT 0330 RIDK     Performed at [facility]   Report Status PENDING   Incomplete  MRSA PCR SCREENING     Status: None   Collection Time     07/10/13 12:55 PM      Result Value Ref Range Status   MRSA by PCR NEGATIVE  NEGATIVE Final   Comment:            The GeneXpert MRSA Assay (FDA     approved for NASAL specimens     only), is one component of a     comprehensive MRSA colonization     surveillance program. It is not     intended to diagnose MRSA     infection nor to guide or     monitor treatment for     MRSA infections.    Radiology Reports Dg Abd 1 View  06/27/2013   CLINICAL DATA:  Nausea, abdominal pain, vomiting  EXAM: ABDOMEN - 1 VIEW  COMPARISON:  None.  FINDINGS: There is a surgical drain present in the mid abdomen. There are surgical stated in the right paramedian abdominal wall. There is a small amount of contrast scattered throughout the colon. There is no bowel dilatation to suggest obstruction. There is no evidence of pneumoperitoneum, portal venous gas or pneumatosis. There are no pathologic calcifications along the expected course of the ureters.The osseous structures are unremarkable.  IMPRESSION: Nonobstructive bowel gas pattern.   Electronically Signed   By: Kathreen Devoid   On: 06/27/2013 09:34      Ct Head Wo Contrast  06/13/2013   CLINICAL DATA:  Altered mental status. Failure to thrive. Seizures.  EXAM: CT HEAD WITHOUT CONTRAST  TECHNIQUE: Contiguous axial images were obtained from the base of the skull through the vertex without intravenous contrast.  COMPARISON:  05/13/2013  FINDINGS: There is no evidence of intracranial hemorrhage, brain edema, or other signs of acute infarction. There is no evidence of intracranial mass lesion or mass effect. No abnormal extraaxial fluid collections are identified.  Mild diffuse cerebral atrophy is stable. No evidence hydrocephalus. No other intracranial abnormality identified. No skull fracture or other bone lesion identified.  IMPRESSION: No acute intracranial findings.  Stable mild cerebral atrophy.   Electronically Signed   By:  Earle Gell M.D.   On: 06/13/2013  19:53     Ct Chest W Contrast  06/21/2013   CLINICAL DATA:  Altered mental status.  Evaluate for malignancy.  EXAM: CT CHEST WITH CONTRAST  TECHNIQUE: Multidetector CT imaging of the chest was performed during intravenous contrast administration.  CONTRAST:  3m OMNIPAQUE IOHEXOL 300 MG/ML  SOLN  COMPARISON:  Chest x-ray dated 05/11/2013  FINDINGS: Heart size and pulmonary vascularity are normal. There is no hilar or mediastinal adenopathy. The lungs are clear. No mass lesions, infiltrates, or effusions. Soft tissues of the chest are normal. Thyroid gland is normal. No acute osseous abnormality. Osteophytes fuse the thoracic spine from T6 through T10.  IMPRESSION: No evidence of malignancy or other significant abnormality of the chest.   Electronically Signed   By: JRozetta NunneryM.D.   On: 06/21/2013 17:13     Mr BJeri CosWWCContrast  06/20/2013   CLINICAL DATA:  Altered mental status. Possible autoimmune encephalitis.  EXAM: MRI HEAD WITHOUT AND WITH CONTRAST  TECHNIQUE: Multiplanar, multiecho pulse sequences of the brain and surrounding structures were obtained without and with intravenous contrast.  CONTRAST:  177mMULTIHANCE GADOBENATE DIMEGLUMINE 529 MG/ML IV SOLN  COMPARISON:  Head CT 06/13/2013 and brain MRI 05/16/2013  FINDINGS: Images are mildly to moderately degraded by motion artifact.  Incidental note is again made of a partially empty sella. No definite residual abnormal T2 or diffusion weighted signal is identified in the thalami. Mild periventricular T2 hyperintensity is unchanged and nonspecific. No new areas of brain parenchymal signal abnormality are identified. There is mild cerebral atrophy. There is no evidence of acute infarct. There is no evidence of mass, midline shift, or extra-axial fluid collection. Orbits are unremarkable. Major intracranial vascular flow voids are unremarkable. Visualized paranasal sinuses and mastoid air cells are clear. There is no abnormal enhancement.   IMPRESSION: Interval resolution of thalamic signal abnormality. No evidence of new intracranial abnormality.   Electronically Signed   By: AlLogan Bores On: 06/20/2013 17:26    Mr Cervical Spine Wo Contrast  06/22/2013   CLINICAL DATA:  Right hemiparesis. Possible autoimmune encephalitis.  EXAM: MRI CERVICAL SPINE WITHOUT CONTRAST  TECHNIQUE: Multiplanar, multisequence MR imaging was performed. No intravenous contrast was administered.  COMPARISON:  No comparison cervical spine MR. brain MR 06/20/2013.  FINDINGS: On this motion grade examination, artifact extends through the cervical cord however, no definitive cervical cord signal abnormality is noted. Cervical medullary junction unremarkable. Intracranial structures as detailed on recent MR of the brain.  Visualized paravertebral structures unremarkable. Both vertebral arteries are patent.  C2-3:  Negative.  C3-4:  Minimal right foraminal narrowing.  C4-5:  Minimal right foraminal narrowing.  C5-6:  Negative.  C6-7:  Negative.  C7-T1:  Negative.  T1-2:  Negative.  T2-3: Minimal Schmorl's node deformity.  Minimal bulge.  T3-4: Minimal bulge.  T4-5: Minimal bulge.  IMPRESSION: No evidence of cervical disc herniation.  Minimal degenerative changes upper thoracic spine.  Please see above.   Electronically Signed   By: StChauncey Cruel.D.   On: 06/22/2013 19:38    UsKorearansvaginal Non-ob  06/21/2013   CLINICAL DATA:  Altered mental status. Evaluation for possible paraneoplastic syndrome.  EXAM: TRANSABDOMINAL AND TRANSVAGINAL ULTRASOUND OF PELVIS  TECHNIQUE: Both transabdominal and transvaginal ultrasound examinations of the pelvis were performed. Transabdominal technique was performed for global imaging of the pelvis including uterus, ovaries, adnexal regions, and pelvic cul-de-sac. It was necessary to proceed with endovaginal  exam following the transabdominal exam to visualize the ovaries.  COMPARISON:  None  FINDINGS: Uterus  Removed.  Right ovary  Not  visualized.  Left ovary  Not visualized.  Other findings  No free fluid.  IMPRESSION: The ovaries could not be visualized on transvaginal or transabdominal pelvic ultrasound. However, review of the CT scan of the pelvis dated 05/17/2013 does demonstrate that both ovaries appear normal, measuring 23 mm on the right and 16 mm on the left.   Electronically Signed   By: Rozetta Nunnery M.D.   On: 06/21/2013 16:45   US Pelvis Complete  06/21/2013   CLINICAL DATA:  Altered mental status. Evaluation for possible paraneoplastic syndrome.  EXAM: TRANSABDOMINAL AND TRANSVAGINAL ULTRASOUND OF PELVIS  TECHNIQUE: Both transabdominal and transvaginal ultrasound examinations of the pelvis were performed. Transabdominal technique was performed for global imaging of the pelvis including uterus, ovaries, adnexal regions, and pelvic cul-de-sac. It was necessary to proceed with endovaginal exam following the transabdominal exam to visualize the ovaries.  COMPARISON:  None  FINDINGS: Uterus  Removed.  Right ovary  Not visualized.  Left ovary  Not visualized.  Other findings  No free fluid.  IMPRESSION: The ovaries could not be visualized on transvaginal or transabdominal pelvic ultrasound. However, review of the CT scan of the pelvis dated 05/17/2013 does demonstrate that both ovaries appear normal, measuring 23 mm on the right and 16 mm on the left.   Electronically Signed   By: Rozetta Nunnery M.D.   On: 06/21/2013 16:45   Ir Fluoro Guide Cv Line Right  06/20/2013   CLINICAL DATA:  Encephalitis and need for tunneled catheter for plasmapheresis.  EXAM: TUNNELED CENTRAL VENOUS CATHETER PLACEMENT WITH ULTRASOUND AND FLUOROSCOPIC GUIDANCE  MEDICATIONS: 1 g IV vancomycin. Vancomycin was given within two hours of incision. Vancomycin was given due to an antibiotic allergy.  ANESTHESIA/SEDATION: 2.0 mg IV Versed; 100 mcg IV Fentanyl.  Total Moderate Sedation Time  Twenty minutes.  FLUOROSCOPY TIME:  42 seconds.  PROCEDURE: The procedure,  risks, benefits, and alternatives were explained to the patient. Questions regarding the procedure were encouraged and answered. The patient understands and consents to the procedure.  The right neck and chest were prepped with chlorhexidine in a sterile fashion, and a sterile drape was applied covering the operative field. Maximum barrier sterile technique with sterile gowns and gloves were used for the procedure. Local anesthesia was provided with 1% lidocaine.  Ultrasound was used to confirm patency of the right internal jugular vein. After creating a small venotomy incision, a 21 gauge needle was advanced into the right internal jugular vein under direct, real-time ultrasound guidance. Ultrasound image documentation was performed. After securing guidewire access, an 8 Fr dilator was placed. A J-wire was kinked to measure appropriate catheter length.  A Bard Equistream tunneled hemodialysis/pheresis catheter measuring 19 cm from tip to cuff was chosen for placement. This was tunneled in a retrograde fashion from the chest wall to the venotomy incision.  At the venotomy, serial dilatation was performed and a 16 Fr peel-away sheath was placed over a guidewire. The catheter was then placed through the sheath and the sheath removed. Final catheter positioning was confirmed and documented with a fluoroscopic spot image. The catheter was aspirated, flushed with saline, and injected with appropriate volume heparin dwells.  The venotomy incision was closed with subcutaneous 3-0 Monocryl and subcuticular 4-0 Vicryl. Dermabond was applied to the incision. The catheter exit site was secured with 0-Prolene retention sutures.  COMPLICATIONS: None.  No pneumothorax.  FINDINGS: After catheter placement, the tips lie in the right atrium. The catheter aspirates normally and is ready for immediate use.  IMPRESSION: Placement of tunneled pheresis catheter via the right internal jugular vein. The catheter tips lie in the right  atrium. The catheter is ready for immediate use.   Electronically Signed   By: Aletta Edouard M.D.   On: 06/20/2013 14:27   Ir Fluoro Guide Cv Line Right  06/18/2013   EXAM: ULTRASOUND AND FLUOROSCOPIC GUIDED PICC LINE INSERTION  MEDICATIONS: None.  TECHNIQUE: The procedure, risks, benefits, and alternatives were explained to the patient's son and informed written consent was obtained. A timeout was performed prior to the initiation of the procedure.  The right upper extremity was prepped with chlorhexidine in a sterile fashion, and a sterile drape was applied covering the operative field. Maximum barrier sterile technique with sterile gowns and gloves were used for the procedure. A timeout was performed prior to the initiation of the procedure. Local anesthesia was provided with 1% lidocaine.  Under direct ultrasound guidance, the right brachial vein was accessed with a micropuncture kit after the overlying soft tissues were anesthetized with 1% lidocaine. An ultrasound image was saved for documentation purposes. A guidewire was advanced to the level of the superior caval-atrial junction for measurement purposes and the PICC line was cut to length. A peel-away sheath was placed and a 36 cm, 5 [place], dual lumen was inserted to level of the superior caval-atrial junction. A post procedure spot fluoroscopic was obtained. The catheter easily aspirated and flushed and was sutured in place. A dressing was placed. The patient tolerated the procedure well without immediate post procedural complication.  CONTRAST:  None  FLUOROSCOPY TIME:  36 seconds.  COMPLICATIONS: None immediate  FINDINGS: After catheter placement, the tip lies within the superior cavoatrial junction. The catheter aspirates and flushes normally and is ready for immediate use.  IMPRESSION: Successful ultrasound and fluoroscopic guided placement of a right brachial vein approach, 36 cm, 5 [place], dual lumen PICC with tip at the superior caval-atrial  junction. The PICC line is ready for immediate use.  INDICATION: Poor venous access, in need of intravenous access for blood draws and medication administration   Electronically Signed   By: Sandi Mariscal M.D.   On: 06/18/2013 15:51      Dg Chest Port 1 View  07/10/2013   CLINICAL DATA:  Shortness of breath.  Hypotension  EXAM: PORTABLE CHEST - 1 VIEW  COMPARISON:  DG CHEST 1V PORT dated 07/03/2013; CT CHEST W/CM dated 06/21/2013  FINDINGS: Right internal jugular dialysis catheter tip: Right atrium. Thoracic spondylosis noted. There is a band of airspace opacity in the right upper lobe just above the minor fissure. Improved aeration at the right lung base.  IMPRESSION: Improved aeration at the right lung base, with but there is a band of airspace opacity in the right upper lobe favoring atelectasis over pneumonia. Next item thoracic spondylosis.   Electronically Signed   By: Sherryl Barters M.D.   On: 07/10/2013 08:27     Dg Chest Port 1 View  07/03/2013   CLINICAL DATA:  Tubes and lines in good position, detailed above.  EXAM: PORTABLE CHEST - 1 VIEW  COMPARISON:  Chest CT 06/21/2013  FINDINGS: There is nasogastric tube which terminates near the pylorus. Right IJ catheter, tip in the upper right atrium. Right upper extremity PICC, tip at the upper cavoatrial junction.  Oral contrast noted within the gastric fundus and  small bowel.  Haziness of the right base, with volume loss. Node definitive consolidation.  No cardiomegaly.  IMPRESSION: 1. Negative for pneumonia. 2. Haziness of the right lower chest, likely pleural fluid or atelectasis. 3. Tubes and lines in good position, detailed above.   Electronically Signed   By: Jorje Guild M.D.   On: 07/03/2013 03:35     Dg Abd Portable 1v  07/10/2013   CLINICAL DATA:  Assess for ileus  EXAM: PORTABLE ABDOMEN - 1 VIEW  COMPARISON:  June 30, 2013  FINDINGS: There is persistent air-filled dilated small bowel loops in the left abdomen. Air is noted in the  colon. Skin staples are projected over the right abdomen. Patient is status post prior cholecystectomy.  IMPRESSION: Persistent ileus.   Electronically Signed   By: Abelardo Diesel M.D.   On: 07/10/2013 02:30     Dg Abd Portable 1v  06/30/2013   CLINICAL DATA:  NG tube placement.  EXAM: PORTABLE ABDOMEN - 1 VIEW  COMPARISON:  Single view of the abdomen 06/27/2013. Upper GI/small bowel follow-through 06/28/2013  FINDINGS: NG tube is in place in good position with the tip in this distal stomach. Contrast material in small bowel from the comparison upper GI series/small bowel follow-through. Small bowel loops are dilated. Only a small amount of loculated barium is seen in the colon.  IMPRESSION: NG tube in good position.  Small bowel obstruction.   Electronically Signed   By: Inge Rise M.D.   On: 06/30/2013 19:01     Dg Addison Bailey G Tube Plc W/fl-no Rad  06/17/2013   CLINICAL DATA: patient to have peg tube placement on January 20th   NASO G TUBE PLACEMENT WITH FLUORO  Fluoroscopy was utilized by the requesting physician.  No radiographic  interpretation.     Dg Ugi W/small Bowel  06/28/2013   CLINICAL DATA:  53 year old female postop 2 days since percutaneous jejunostomy tube placement. Drainage from NG tube. Abdominal pain. Initial encounter.  EXAM: UPPER GI SERIES WITH SMALL BOWEL FOLLOW-THROUGH  FLUOROSCOPIC GUIDED NG TUBE PLACEMENT  TECHNIQUE: Combined double contrast and single contrast upper GI series using effervescent crystals, thick barium, and thin barium. Subsequently, serial images of the small bowel were obtained including spot views of the terminal ileum.  COMPARISON:  KUB 06/27/2013 and earlier.  FLUOROSCOPY TIME:  9 min and 18 seconds.  FINDINGS: Preprocedural scout view of the abdomen demonstrates a small volume of loculated barium in the colon. This is related to the 06/17/2013 NG tube placement/ confirmation study.  Scout view demonstrates the recently placed right an mid lower abdomen  percutaneous jejunostomy. Right lower abdomen skin staples in place.  The scout view also demonstrates the patient's NG tube is looped back up into the esophagus. This was evaluated in real-time with fluoroscopy, and a wire was placed through the tube in an effort to maneuver the tip back into the stomach. However, this was unsuccessful an the tube had be retracted into the nasopharynx. Using fluoroscopic guidance, the tube was then advanced with a wire into the stomach, such that the tip and side hole are within the stomach.  Given the recent abdominal surgery, initially water-soluble contrast was planned, however prominent gastroesophageal reflux of contrast was noted early on, and the decision was made to switch contrast to thin barium. Water-soluble contrast was fully aspirated from the stomach. Subsequently, barium contrast was slowly administered.  The stomach appears small. No gastrojejunostomy is evident. After a short delay, barium emptied the stomach  into the proximal duodenum. The second portion of the duodenum is dilated.  After 20 additional min significantly dilated proximal jejunum is opacified, with the small bowel caliber up to 58 mm.  At this point study was discussed with Dr. Judeth Horn . We decided to inject the percutaneous jejunostomy tube, and give the barium more time to opacified a small bowel in hopes of loose to dating the small bowel obstruction transition point.  Barium injection through the jejunostomy tube demonstrates normal small bowel loops at the jejunostomy site. This barium was then aspirated.  Subsequently a 3 hr delayed image was obtained demonstrating a gradual tapering of small bowel loops into the right lower abdomen, heading toward the vicinity of the jejunostomy. These smaller loops measure 23 mm diameter (arrow).  At this point, Dr. Hulen Skains advised that no additional imaging was needed.  IMPRESSION: 1. High-grade small bowel obstruction. Dilated duodenum and proximal  jejunum up to 58 mm diameter. 2. Small bowel loops slowly taper to the right lower abdomen in the vicinity of the percutaneous jejunostomy. 3. The jejunostomy was injected with contrast, and is situated within normal decompressed small bowel. 4. Diminutive stomach. No gastrojejunostomy is evident. Prominent gastroesophageal reflux. 5. At the beginning of the exam the patient's NG tube was malpositioned. This was withdrawn to the nasopharynx and repositioned into the stomach under fluoroscopic guidance.   Electronically Signed   By: Lars Pinks M.D.   On: 06/28/2013 16:29      CBC  Recent Labs Lab 07/07/13 0325 07/08/13 0430 07/09/13 1055 07/10/13 0540 07/11/13 0500  WBC 7.4 5.8 8.1 4.9 7.8  HGB 8.8* 8.0* 8.6* 8.4* 6.2*  HCT 26.7* 24.9* 26.7* 25.8* 18.7*  PLT 111* 97* 106* 93* 96*  MCV 96.7 97.6 98.9 97.7 98.4  MCH 31.9 31.4 31.9 31.8 32.6  MCHC 33.0 32.1 32.2 32.6 33.2  RDW 17.4* 17.5* 17.3* 17.0* 17.4*    Chemistries   Recent Labs Lab 07/05/13 0530  07/07/13 0325 07/08/13 0430 07/09/13 1055 07/10/13 0540 07/11/13 0500  NA 144  < > 141 145 142 137 134*  K 4.4  < > 4.3 4.6 4.6 4.4 2.5*  CL 108  < > 106 111 107 101 108  CO2 25  < > _0 16*  GLUCOSE 104*  < > 103* 102* 98 103* 306*  BUN 4*  < > <3* <3* <3* 4* 5*  CREATININE 0.60  < > 0.60 0.62 0.61 0.55 0.37*  CALCIUM 7.6*  < > 7.8* 7.9* 7.7* 7.5* 5.0*  MG 1.7  --   --  1.9  --   --   --   < > = values in this interval not displayed. ------------------------------------------------------------------------------------------------------------------ estimated creatinine clearance is 85.6 ml/min (by C-G formula based on Cr of 0.37). ------------------------------------------------------------------------------------------------------------------ No results found for this basename: HGBA1C,  in the last 72  hours ------------------------------------------------------------------------------------------------------------------ No results found for this basename: CHOL, HDL, LDLCALC, TRIG, CHOLHDL, LDLDIRECT,  in the last 72 hours ------------------------------------------------------------------------------------------------------------------ No results found for this basename: TSH, T4TOTAL, FREET3, T3FREE, THYROIDAB,  in the last 72 hours ------------------------------------------------------------------------------------------------------------------ No results found for this basename: VITAMINB12, FOLATE, FERRITIN, TIBC, IRON, RETICCTPCT,  in the last 72 hours  Coagulation profile No results found for this basename: INR, PROTIME,  in the last 168 hours  No results found for this basename: DDIMER,  in the last 72 hours  Cardiac Enzymes No results found for this basename: CK, CKMB, TROPONINI, MYOGLOBIN,  in the last 168  hours ------------------------------------------------------------------------------------------------------------------ No components found with this basename: POCBNP,      Time Spent in minutes  35   Lala Lund K M.D on 07/11/2013 at 12:30 PM  Between 7am to 7pm - Pager - [phone]  After 7pm go to [email or web address] - password [email or web address]  And look for the night coverage person covering for me after hours  [facility] Office  [phone]

## 2013-07-11 NOTE — Progress Notes (Signed)
CRITICAL VALUE ALERT  Critical value received:  Hgb = 6.2  Date of notification: 07/11/13  Time of notification: 0526  Critical value read back: yes  Nurse who received alert:  Brett Fairy Qusay Villada  MD notified (1st page): CCM MD Time of first page:  0530  MD notified (2nd page):  Time of second page:  Responding MD:  Dr. Darrick Pennaeterding  Time MD responded:  [phone]

## 2013-07-11 NOTE — Progress Notes (Addendum)
NUTRITION FOLLOW-UP/CONSULT  INTERVENTION: Resume Vital AF 1.2 at 30 ml/hr via J-tube and advance q 4 hours to goal rate of 60 ml/hr. Goal rate will provide: 1728 kcal, 108 grams protein, 1168 ml free water.  Add free water of 140 ml free water QID. This will provide and additional 560 ml free water daily; for a total of 1728 free water daily. RD to continue to follow nutrition care plan.  NUTRITION DIAGNOSIS: Inadequate oral intake related to AMS as evidenced by limited oral intake.  Ongoing.  Goal: Intake to meet >90% of estimated nutrition needs. Unmet.  Monitor:  weight trends, lab trends, I/O's, TF tolerance and adequacy  ASSESSMENT: PMHx significant for catatonia, HTN, anxiety, gastric bypass. From SNF.  Recent admission from 12/13 - 12/28 with toxic metabolic encephalopathy, hospital course was complicated with pyelonephritis and C. Difficile colitis. Pt was scheduled for PEG on 1/20, was found to be agitated and confused and was sent to ED.   Neuro saw pt on 1/21 and recommended plasma exchanges every other day as well as suppressive steroids to reduce her seizure foci.  Pt developed frequent amount of large stooling from vagina on 1/25 and was sent to SDU. Transferred back to Med-Surge floor on 2/9.  1/27 - Underwent open J-tube placement 1/28 - Osmolite 1.5 at 10 ml/hr initiated; pt developed vomiting after feedings were started and anti-emetics were not effective. 1/29 - Feeds were re-attempted overnight.  1/30 - NGT placed to LWIS; 250 ml output; Osmolite 1.5 resumed at 20 ml/hr 1/31 - NGT in place to LWIS; 1000 ml output; Osmolite 1.5 at 20 ml/hr 2/1 - NGT in place to LWIS; 950 ml output; pt pulled out NGT later in evening; Osmolite 1.5 at 20 ml/hr 2/2 - NGT replaced to LWIS; 300 ml output; Osmolite 1.5 at 20 ml/hr 2/3 - NGT in place to LWIS; 100 ml/hr output per RN; Osmolite 1.5 on hold 2/2 pt to surgery; J-tube revision in OR with small bowel resection 2/4 - NGT in place  to [facility]; per surgery do not use J-tube yet 2/5 - NGT d/c'd; trickle TF of Osmolite 1.2 at 20 ml/hr initiated per surgery 2/6 - TF of Osmolite 1.2 at 20 ml/hr - RD with orders to increase feedings; RN reports tolerating well 2/7- RD re-consulted due to diarrhea. Change TF from Osmolite to Jevity.  2/9 - Continues with persistent diarrhea, at least 600 ml/day. Discussed with Dr. Janee Morn re: changing to elemental formula, he is in agreement.  Change TF from Jevity to Vital AF 1.2. 2/10 - Diarrhea improved. C-diff negative; rotavirus positive. Pt achieved goal rate of Vital AF 1.2 at 60 ml/hr. 2/11 - Pt developed vomiting. TF held. Pt febrile. Abdominal film with mild ileus pattern. Pt with possible UTI.  2/12 - Per surgery, not obstruction and resume feedings. R IJ catheter removed 2/2 concern for infection.  Potassium low at 2.5.  Magnesium and phosphorus are all WNL.  RD consulted to start TF and free water flushes.   Height: Ht Readings from Last 1 Encounters:  07/06/13 5\' 3"  (1.6 m)    Weight: Wt Readings from Last 1 Encounters:  07/11/13 185 lb 13.6 oz (84.3 kg)  Admit wt 152 lb; net +23 liters since admit  BMI:  Body mass index is 32.93 kg/(m^2).  Obese Class I  Estimated Nutritional Needs: Kcal: 1600 - 1800 Protein: 75 - 90 g Fluid: 1.8 - 2 liters  Skin: abdomen incision  Diet Order: NPO   EDUCATION NEEDS: -  No education needs identified at this time   Intake/Output Summary (Last 24 hours) at 07/11/13 1129 Last data filed at 07/11/13 0802  Gross per 24 hour  Intake 5670.8 ml  Output      0 ml  Net 5670.8 ml    Last BM: 700 ml via flexiseal 2/12  Labs:   Recent Labs Lab 07/05/13 0530  07/08/13 0430 07/09/13 1055 07/10/13 0540 07/11/13 0500  NA 144  < > 145 142 137 134*  K 4.4  < > 4.6 4.6 4.4 2.5*  CL 108  < > 111 107 101 108  CO2 25  < > 27 24 22  16*  BUN 4*  < > <3* <3* 4* 5*  CREATININE 0.60  < > 0.62 0.61 0.55 0.37*  CALCIUM 7.6*  < > 7.9*  7.7* 7.5* 5.0*  MG 1.7  --  1.9  --   --   --   PHOS 2.6  --  2.7  --   --   --   GLUCOSE 104*  < > 102* 98 103* 306*  < > = values in this interval not displayed.  CBG (last 3)   Recent Labs  07/10/13 2347 07/11/13 0358 07/11/13 0751  GLUCAP 81 71 69*   Prealbumin  Date/Time Value Ref Range Status  06/24/2013  4:30 PM 11.7* 17.0 - 34.0 mg/dL Final     Performed at [facility]    Scheduled Meds: . aztreonam  1 g Intravenous 3 times per day  . calcium gluconate  1 g Intravenous BID  . chlorhexidine  15 mL Mouth Rinse BID  . dextrose      . enoxaparin (LOVENOX) injection  40 mg Subcutaneous Q24H  . hydrocortisone sod succinate (SOLU-CORTEF) inj  50 mg Intravenous Q6H  . levETIRAcetam  1,000 mg Intravenous Q12H  . magnesium sulfate 1 - 4 g bolus IVPB  1 g Intravenous Once  . mirtazapine  15 mg Per Tube QHS  . pantoprazole sodium  40 mg Per Tube Q1200  . potassium chloride  10 mEq Intravenous Q1 Hr x 6  . potassium chloride  40 mEq Oral BID  . risperiDONE  0.5 mg Oral BID  . sodium chloride  10 mL Intravenous Q12H  . valproate sodium  500 mg Intravenous 3 times per day    Continuous Infusions: . sodium chloride 125 mL/hr (07/11/13 0257)  . citrate dextrose    . phenylephrine (NEO-SYNEPHRINE) Adult infusion 90 mcg/min (07/11/13 0200)   Jarold MottoSamantha Taesean Reth MS, RD, LDN Pager: [phone] After-hours pager: [phone]

## 2013-07-11 NOTE — Progress Notes (Signed)
CRITICAL VALUE ALERT  Critical value received: K = 2,5, Ca=5  Date of notification:  07/11/13  Time of notification:  0607  Critical value read back: Yes  Nurse who received alert: Jeanice LimHolly, RN  MD notified (1st page): CCM MD  Time of first page: 0612  MD notified (2nd page):  Time of second page:  Responding MD:  Dr. Darrick Pennaeterding  Time MD responded:  [phone]

## 2013-07-11 NOTE — Progress Notes (Addendum)
Successful removal of right internal jugular tunneled central catheter secondary to concern for infection and finished plasmapheresis treatment. No immediate complications.  Pattricia BossKoreen Maram Bently PA-C Interventional Radiology  07/11/13  11:16 AM

## 2013-07-11 NOTE — Progress Notes (Signed)
Patient examined and I agree with the assessment and plan  Violeta GelinasBurke Naomii Kreger, MD, MPH, FACS Pager: [phone]  07/11/2013 4:59 PM

## 2013-07-12 ENCOUNTER — Inpatient Hospital Stay (HOSPITAL_COMMUNITY): Payer: Medicare Other

## 2013-07-12 LAB — TYPE AND SCREEN
ABO/RH(D): A POS
Antibody Screen: NEGATIVE
UNIT DIVISION: 0
Unit division: 0

## 2013-07-12 LAB — CBC
HCT: 33.5 % — ABNORMAL LOW (ref 36.0–46.0)
Hemoglobin: 11.4 g/dL — ABNORMAL LOW (ref 12.0–15.0)
MCH: 30.7 pg (ref 26.0–34.0)
MCHC: 34 g/dL (ref 30.0–36.0)
MCV: 90.3 fL (ref 78.0–100.0)
Platelets: 123 10*3/uL — ABNORMAL LOW (ref 150–400)
RBC: 3.71 MIL/uL — ABNORMAL LOW (ref 3.87–5.11)
RDW: 18.6 % — AB (ref 11.5–15.5)
WBC: 4.8 10*3/uL (ref 4.0–10.5)

## 2013-07-12 LAB — COMPREHENSIVE METABOLIC PANEL
ALT: 10 U/L (ref 0–35)
AST: 19 U/L (ref 0–37)
Albumin: 2 g/dL — ABNORMAL LOW (ref 3.5–5.2)
Alkaline Phosphatase: 61 U/L (ref 39–117)
BUN: 6 mg/dL (ref 6–23)
CALCIUM: 8.3 mg/dL — AB (ref 8.4–10.5)
CO2: 22 meq/L (ref 19–32)
Chloride: 106 mEq/L (ref 96–112)
Creatinine, Ser: 0.51 mg/dL (ref 0.50–1.10)
GFR calc Af Amer: >90 mL/min (ref >90)
GFR calc non Af Amer: >90 mL/min (ref >90)
Glucose, Bld: 95 mg/dL (ref 70–99)
Potassium: 4.5 mEq/L (ref 3.7–5.3)
Sodium: 139 mEq/L (ref 137–147)
TOTAL PROTEIN: 4.6 g/dL — AB (ref 6.0–8.3)
Total Bilirubin: 0.5 mg/dL (ref 0.3–1.2)

## 2013-07-12 LAB — GLUCOSE, CAPILLARY
GLUCOSE-CAPILLARY: 88 mg/dL (ref 70–99)
GLUCOSE-CAPILLARY: 101 mg/dL — AB (ref 70–99)
Glucose-Capillary: 86 mg/dL (ref 70–99)
Glucose-Capillary: 100 mg/dL — ABNORMAL HIGH (ref 70–99)
Glucose-Capillary: 103 mg/dL — ABNORMAL HIGH (ref 70–99)
Glucose-Capillary: 101 mg/dL — ABNORMAL HIGH (ref 70–99)

## 2013-07-12 LAB — HEMOGLOBIN A1C
Hgb A1c MFr Bld: 4.7 % (ref <5.7)
MEAN PLASMA GLUCOSE: 88 mg/dL (ref <117)

## 2013-07-12 LAB — MAGNESIUM: MAGNESIUM: 1.8 mg/dL (ref 1.5–2.5)

## 2013-07-12 MED ORDER — HYDROCORTISONE NA SUCCINATE PF 100 MG IJ SOLR
50.0000 mg | Freq: Two times a day (BID) | INTRAMUSCULAR | Status: DC
  Administered 2013-07-12 – 2013-07-13 (×2): 50 mg via INTRAVENOUS
  Filled 2013-07-12 (×4): qty 1

## 2013-07-12 MED ORDER — METOPROLOL TARTRATE 1 MG/ML IV SOLN
5.0000 mg | INTRAVENOUS | Status: DC | PRN
  Administered 2013-07-12 – 2013-07-13 (×2): 5 mg via INTRAVENOUS
  Filled 2013-07-12 (×2): qty 5

## 2013-07-12 MED ORDER — METOPROLOL TARTRATE 1 MG/ML IV SOLN
5.0000 mg | INTRAVENOUS | Status: DC | PRN
  Administered 2013-07-12: 5 mg via INTRAVENOUS
  Filled 2013-07-12: qty 5

## 2013-07-12 MED ORDER — DEXTROSE 5 % IV SOLN
INTRAVENOUS | Status: DC
  Administered 2013-07-12: 30 mL via INTRAVENOUS

## 2013-07-12 MED ORDER — METOPROLOL TARTRATE 50 MG PO TABS
50.0000 mg | ORAL_TABLET | Freq: Two times a day (BID) | ORAL | Status: DC
  Filled 2013-07-12 (×2): qty 1

## 2013-07-12 MED ORDER — LOPERAMIDE HCL 1 MG/5ML PO LIQD
2.0000 mg | Freq: Two times a day (BID) | ORAL | Status: DC
  Administered 2013-07-13: 2 mg
  Filled 2013-07-12 (×7): qty 10

## 2013-07-12 NOTE — Progress Notes (Signed)
Staples removed from abdominal incision per NP orders; site dry c NAB, no drainage noted; steri strips applied per orders; will continue to monitor

## 2013-07-12 NOTE — Progress Notes (Signed)
10 Days Post-Op  Subjective: TF off, J tube clogged.  Nausea is "so so"  out liquid stool.  Objective: Vital signs in last 24 hours: Temp:  [97.5 F (36.4 C)-99.3 F (37.4 C)] 99.3 F (37.4 C) (02/13 0400) Pulse Rate:  [113-150] 138 (02/13 0700) Resp:  [0-32] 15 (02/13 0700) BP: (99-152)/(37-112) 143/75 mmHg (02/13 0700) SpO2:  [97 %-100 %] 97 % (02/13 0700) Weight:  [83.8 kg (184 lb 11.9 oz)] 83.8 kg (184 lb 11.9 oz) (02/13 0300) Last BM Date: 07/11/13  Intake/Output from previous day: 02/12 0701 - 02/13 0700 In: 3858.5 [I.V.:982; Blood:670; NG/GT:656.5; IV Piggyback:1550] Out: -  Intake/Output this shift:   PE General appearance: alert, cooperative and no distress GI: soft, non-tender; bowel sounds normal; no masses,  no organomegaly  Lab Results:   Recent Labs  07/11/13 0500 07/12/13 0500  WBC 7.8 4.8  HGB 6.2* 11.4*  HCT 18.7* 33.5*  PLT 96* 123*   BMET  Recent Labs  07/11/13 0500 07/12/13 0500  NA 134* 139  K 2.5* 4.5  CL 108 106  CO2 16* 22  GLUCOSE 306* 95  BUN 5* 6  CREATININE 0.37* 0.51  CALCIUM 5.0* 8.3*   PT/INR No results found for this basename: LABPROT, INR,  in the last 72 hours ABG No results found for this basename: PHART, PCO2, PO2, HCO3,  in the last 72 hours  Studies/Results: Ct Abdomen Pelvis Wo Contrast  07/10/2013   CLINICAL DATA:  Sepsis question intra-abdominal source  EXAM: CT ABDOMEN AND PELVIS WITHOUT CONTRAST  TECHNIQUE: Multidetector CT imaging of the abdomen and pelvis was performed following the standard protocol without intravenous contrast. Sagittal and coronal MPR images reconstructed from axial data set. GI contrast administered  COMPARISON:  CT pelvis 05/17/2013; CT abdomen and pelvis 06/02/2011  FINDINGS: Small moderate-sized right pleural effusion with basilar atelectasis.  Minimal atelectasis left base.  Gallbladder surgically absent.  Prior gastric surgery.  Jejunostomy tube left mid abdomen, through which GI  contrast was administered.  Dilated small bowel loops in the upper abdomen terminating at an anastomotic staple line in the left mid abdomen most compatible with small bowel obstruction ; the lack of dilatation of the remaining small bowel and colon makes ileus much less likely.  Small bowel measures up to 4.8 cm transverse mild bowel wall thickening of the dilated loops.  Distal small bowel is decompressed and unremarkable.  Small foci of gas in the left mid abdomen into the upper left pelvis appear to be within several small bowel loops which are nondistended and no definite free intraperitoneal air is identified.  Decompressed colon containing contrast and a rectal tube.  Unremarkable bladder and ovaries with surgical absence of uterus.  Small amount of free pelvic fluid.  Diffuse soft tissue edema/ anasarca.  No mass, adenopathy, or hernia.  Osseous structures unremarkable.  IMPRESSION: Dilated proximal small bowel loops proximal to the jejunostomy site most consistent with small bowel obstruction, associated with mild bowel wall thickening and duodenal dilatation.  Decompressed distal small bowel and colon.  Small amount of nonspecific free pelvic fluid.   Electronically Signed   By: Ulyses Southward M.D.   On: 07/10/2013 17:38   Ct Angio Chest Pe W/cm &/or Wo Cm  07/11/2013   CLINICAL DATA:  Altered mental status, tachycardia, chest pain and shortness breath  EXAM: CT ANGIOGRAPHY CHEST WITH CONTRAST  TECHNIQUE: Multidetector CT imaging of the chest was performed using the standard protocol during bolus administration of intravenous contrast.  Multiplanar CT image reconstructions and MIPs were obtained to evaluate the vascular anatomy.  CONTRAST:  100mL OMNIPAQUE IOHEXOL 350 MG/ML SOLN  COMPARISON:  DG CHEST 1V PORT dated 07/11/2013; CT ABD/PELV WO CM dated 07/10/2013; CT CHEST W/CM dated 06/21/2013  FINDINGS: There are no filling defects within the pulmonary arteries to suggest acute pulmonary embolism. Acute  findings of the aorta or great vessels. No pericardial fluid.  There are bilateral pleural effusions, moderate to large on the right and small on the left. There is associated right lower lobe passive atelectasis.  Limited view of the upper abdomen is unremarkable. Limited view of the skeleton demonstrates degenerative spurring.  Review of the MIP images confirms the above findings.  IMPRESSION: 1. No evidence acute pulmonary embolism. 2. Moderate to large right pleural effusion with associated passive atelectasis. 3. No pericardial fluid.   Electronically Signed   By: Genevive BiStewart  Edmunds M.D.   On: 07/11/2013 17:53   Ir Removal Tun Cv Cath W/o Fl  07/11/2013   CLINICAL DATA:  Sepsis, concern for right internal jugular tunneled central catheter infection, finished plasmapheresis treatment, request for removal.  EXAM: REMOVAL OF TUNNELED CENTRAL VENOUS CATHETER  PROCEDURE: Risks and benefits of procedure discussed with the patient's son over the phone, verbal consent was obtained.  The right chest tunneled central catheter site was prepped with chlorhexidine. A sterile gown and gloves were worn during the procedure.  Utilizing manual manipulation, the subcutaneous cuff of the central catheter was freed. The catheter was then successfully removed in its entirety. A sterile dressing was applied over the catheter exit site.  IMPRESSION: Removal of tunneled right internal jugular central catheter utilizing manual manipulation. The procedure was uncomplicated.  Read By:  Pattricia BossKoreen Morgan PA-C   Electronically Signed   By: Malachy MoanHeath  McCullough M.D.   On: 07/11/2013 11:39   Dg Chest Port 1 View  07/11/2013   CLINICAL DATA:  Shortness of breath, evaluate pulmonary infiltrates  EXAM: PORTABLE CHEST - 1 VIEW  COMPARISON:  DG CHEST 1V PORT dated 07/10/2013; DG CHEST 1V PORT dated 07/03/2013; CT CHEST W/CM dated 06/21/2013; IR FLUORO GUIDE CV LINE*R* dated 06/20/2013; DG CHEST 1V PORT dated 05/11/2013  FINDINGS: Grossly unchanged  cardiac silhouette and mediastinal contours given patient rotation. Stable position of support apparatus. Grossly unchanged right mid lung heterogeneous airspace opacities with worsening bilateral medial basilar airspace opacities. No definite pleural effusion or pneumothorax. No evidence of edema. A minimal amount of enteric contrast is seen within in the gastric fundus. Post cholecystectomy. Grossly unchanged bones.  IMPRESSION: 1.  Stable positioning of support apparatus.  No pneumothorax. 2. Worsening bibasilar airspace disease, possibly atelectasis though progression of multifocal infection / aspiration is not excluded. A follow-up chest radiograph in 4 to 6 weeks after treatment is recommended to ensure resolution.   Electronically Signed   By: Simonne ComeJohn  Watts M.D.   On: 07/11/2013 07:47   Dg Chest Port 1 View  07/10/2013   CLINICAL DATA:  Shortness of breath.  Hypotension  EXAM: PORTABLE CHEST - 1 VIEW  COMPARISON:  DG CHEST 1V PORT dated 07/03/2013; CT CHEST W/CM dated 06/21/2013  FINDINGS: Right internal jugular dialysis catheter tip: Right atrium. Thoracic spondylosis noted. There is a band of airspace opacity in the right upper lobe just above the minor fissure. Improved aeration at the right lung base.  IMPRESSION: Improved aeration at the right lung base, with but there is a band of airspace opacity in the right upper lobe favoring atelectasis over pneumonia.  Next item thoracic spondylosis.   Electronically Signed   By: Herbie Baltimore M.D.   On: 07/10/2013 08:27   Dg Abd Portable 1v  07/12/2013   CLINICAL DATA:  Abdominal pain  EXAM: PORTABLE ABDOMEN - 1 VIEW  COMPARISON:  07/11/2013  FINDINGS: Postsurgical changes are again seen. A feeding catheter is noted. Scattered large and small bowel gas is seen. No obstructive changes are noted. No definitive free air is seen.  IMPRESSION: No significant change from the prior exam.   Electronically Signed   By: Alcide Clever M.D.   On: 07/12/2013 07:44   Dg  Abd Portable 1v  07/11/2013   CLINICAL DATA:  Abdominal distention, evaluate for small bowel obstruction  EXAM: PORTABLE ABDOMEN - 1 VIEW  COMPARISON:  CT ABD/PELV WO CM dated 07/10/2013; DG ABD PORTABLE 1V dated 07/10/2013; DG ABD PORTABLE 1V dated 06/30/2013  FINDINGS: There has been passage of the majority of previously administered enteric contrast. A minimal amount of enteric contrast remains within the colon. Paucity of bowel gas without definite evidence of obstruction. Nondiagnostic evaluation pneumoperitoneum secondary supine positioning exclusion of lower thorax. No definite pneumatosis or portal venous gas.  A feeding jejunostomy tube overlies the left mid/ lower abdomen.  A vertically aligned skin staples overlie the right mid abdomen. Post cholecystectomy.  Unchanged bones.  IMPRESSION: Paucity of bowel gas, however there has been apparent passage of previously ingested enteric contrast. No definite evidence of obstruction.   Electronically Signed   By: Simonne Come M.D.   On: 07/11/2013 08:26    Anti-infectives: Anti-infectives   Start     Dose/Rate Route Frequency Ordered Stop   07/11/13 1230  ciprofloxacin (CIPRO) IVPB 400 mg     400 mg 200 mL/hr over 60 Minutes Intravenous Every 12 hours 07/11/13 1151     07/10/13 2100  vancomycin (VANCOCIN) IVPB 750 mg/150 ml premix  Status:  Discontinued     750 mg 150 mL/hr over 60 Minutes Intravenous Every 8 hours 07/10/13 1215 07/11/13 1103   07/10/13 1400  aztreonam (AZACTAM) 1 g in dextrose 5 % 50 mL IVPB     1 g 100 mL/hr over 30 Minutes Intravenous 3 times per day 07/10/13 1215     07/10/13 1230  vancomycin (VANCOCIN) IVPB 1000 mg/200 mL premix     1,000 mg 200 mL/hr over 60 Minutes Intravenous  Once 07/10/13 1215 07/10/13 1422   07/02/13 1330  ciprofloxacin (CIPRO) IVPB 400 mg     400 mg 200 mL/hr over 60 Minutes Intravenous To Surgery 07/02/13 1321 07/02/13 1326   06/23/13 1400  piperacillin-tazobactam (ZOSYN) IVPB 3.375 g  Status:   Discontinued     3.375 g 12.5 mL/hr over 240 Minutes Intravenous 3 times per day 06/23/13 1336 06/26/13 1441   06/20/13 1200  vancomycin (VANCOCIN) IVPB 1000 mg/200 mL premix     1,000 mg 200 mL/hr over 60 Minutes Intravenous  Once 06/20/13 1146 06/20/13 1433      Assessment/Plan: S/P J tube revision POD#10 -does not appear obstructed, out of liquid stool. Start imodium first, will consider resuming paregoric if it persists.  J tube is clogged, flush with cola first, and leave x1 hour, call with results.  DC staples.    Hypokalemia-resolved    LOS: 23 days    Bonner Puna [facility] ANP-BC Pager [phone] 07/12/2013 8:04 AM

## 2013-07-12 NOTE — Progress Notes (Signed)
Patient ID: Candida PeelingLora Majeed, female   DOB: 01/27/1961, 53 y.o.   MRN: [id number] J tube still clogged.The tube had pulled back some under the dressing. I passed a soft tipped vascular guidewire a few times carefully and advanced it to appropriate position. Some bile can be aspirated but it will not flush well. I passed the catheter several times but cannot get it to flush easily. Bile pulls back some. I will ask IR to try to replace it in the AM.New suture placed to secure it. Violeta GelinasBurke Trynity Skousen, MD, MPH, FACS Pager: [phone]

## 2013-07-12 NOTE — Progress Notes (Signed)
Trying coke in J-tube. No nausea. No abdominal pain. Patient examined and I agree with the assessment and plan  Violeta GelinasBurke Dequavion Follette, MD, MPH, FACS Pager: [phone]  07/12/2013 10:07 AM

## 2013-07-12 NOTE — Progress Notes (Signed)
PO meds held today per MD Thedore MinsSingh verbal order due to questionable swallowing safety status and non-working J tube; IV PRN medicine given per verbal order for elevated HR

## 2013-07-12 NOTE — Progress Notes (Signed)
Upon assessing patient abdomen, peg tube was disconnected at lopez valve connection. Attempted to flush peg with no success. Tube feeding stopped because peg tube is occluded. Will continue to monitor

## 2013-07-12 NOTE — Progress Notes (Signed)
Patient Demographics  Joan [name], is a 53 y.o. female, DOB - March 30, 1961, MBW:[id number]  Admit date - 06/19/2013   Admitting Physician Charlynne Cousins, MD  Outpatient Primary MD for the patient is [name],[name], MD  LOS - 23   No chief complaint on file.       Assessment & Plan    Assumed care of the patient on 07/10/2013 on day 21 of her hospital stay   HPI/Subjective:   53 yr old female presents with a CC of Altered mental status. Pt has a PMH of peripheral neuropathy, seizures, catatonia, anxiety, HTN, and fibromyalgia. Pt was previously admitted on 12/13 thru 12/28 with toxic metabolic encephalopathy. 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 EEG showed no recurrent seizures. A 24 hour EEG showed an intermittent delta pattern felt to represent Frontal Intermittent Rhythmic Delta Activity. Pt was started on two antiepileptics.    This admission, she was again found altered and seemed "comatose" . Since d/c in December, pt has had persistent confusion and is confined to a wheelchair at [facility]. She is currently confused and experiencing visual hallucinations.She is s/p J tube placement and followed with intractable nausea, vomiting. Surgery following, . ON 2/3 night pt developed fever of 101.4, became tachycardic and hypotensive. CXR, done , UA ordered and blood cultures negative so far. Her work up so far negative for infection. H&H remains stable at 8.5 , and she was found to have mild leukocytosis.    Patient subsequently on 07/10/2013 was seen, she was found to be persistently hypotensive, tachycardic, febrile, despite 3 L of IV fluids her blood pressure stayed around 80 systolic, pulmonary critical care was consulted and  patient was transferred to step down unit. Possible sources of infection aspiration with hospital-acquired pneumonia, intra-abdominal sepsis due to recent J-tube reversal, dialysis catheter which has been placed about 2 weeks ago.       Assessment/Plan:   Sepsis on 07/10/2013 - she was found to be persistently hypotensive, tachycardic, febrile, was transferred to step down unit to 05/18/2014, critical care team was consulted, Gram bacteria on 1/2 B cultures, this likely is line infection vs Urological source, less likely  aspiration pneumonia - [facility] also had a recent intra-abdominal sepsis due to recent J-tube reversal, and had a dialysis catheter which has been placed about 2 weeks ago. Change foley 07-11-13.  Been placed on appropriate broad-spectrum antibiotics with improvement, dialysis catheter removed on 07/11/2013, blood pressure better with IV fluids, lactic acid improvement in critical care monitoring the patient also. General surgery on board for J-tube reversal surgery. Abdominal x-rays appear stable.     Anemia   Unclear etiology, s/p 2 unit s of prbc transfusion and started her on protonix . Her stool for occult blood negative in the last 2 weeks. Anemia panel showed severe iron deficiency.  We'll replace IV IM once she is stable from infection standpoint, She might require EGD once more stable, GI consulted recommended outpatient follow up with her GI physician at [place].      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 Pt received diatech catheter on 06/20/2013 placement  and received and finished plasmapheresis returns, case discussed with neurologist Dr. Doy Mince on 07/11/2013.  -MRI of the brain showed no acute abnormality.-Ammonia level is normal . [facility] paraneoplastic panel reported to be negative, MRI of the C-spine is negative for acute findings   - We'll continue to monitor with  supportive care, so far minimal improvement.  - start PT-OT     Tachycardia  - Has been problem for the last several weeks, despite clinical improvement in sepsis she remains in sinus tachycardia, volume status appears appropriate, does not appear to be in any pain or discomfort, stable TSH and echogram, CT angina negative for PE. -Place on on po metoprolol.       Hx of Convulsions/Seizures  -Pt has a PMH of seizures and catatonia.  -MRI of the brain showed thalamic signal abnormality during previous admission. No longer present on current MRI.  -Pt currently taking Keppra and Depakene. Increased Depakene to $RemoveBef'500mg'eztINxzvGO$  tid      Hypokalemia and hypocalcemia  - Repleted and recheck     Nausea&Vomiting recent J-tube surgery also initially had some diarrhea   C. difficile negative and diarrhea has improved, supportive care for that. Abdominal x-rays do not show any evidence of obstruction, J-tube clogs intermittently and being managed by general surgery. Once appropriate for use we will commence medications via J-tube along with nutrition and free water flushes.    Hx of Anxiety  -Continue pt on Remeron  -Patient appears to have had a psychiatric history  -Current illness is bringing out psychiatric symptoms.  -Psychiatry consulted as psychotropic medications may help with her current symptoms.  -Psych prescribed Risperdal .5 mg bid  Ativan prn for anxiety.     Hypertension  -Pt has a PMH of HTN  -Likely secondary to anxiety  Continue metoprolol and prn hydralazine.     Malnutrition with history of Gastric Bypass  -Per SNF patient has had no PO intake for over 1 week prior to admission.  -SNF sent her to hospital for J tube placement.  -Gen Surg consulted, J-tube placed and underwent revision of j tube withexploratory laparotomy on 2/3 and small bowel resection.      Recent hx of c-diff (05/18/13)  -Pt negative for C-diff at this admit      Thrombocytopenia    -No acute bleeding monitor     Enterovaginal fistula  -Reported prior to that this patient had some stools in her vagina.  -Discussed with radiology, no evidence of rectovaginal fistula and the previous imaging, (patient had indicated pelvic scan done previously).  - Consulted GI, and the did not feel that patient will benefit from colonoscopy for the diagnosis of fistula.  - Called and discussed with [facility] on-call, and they recommend to follow the patient as outpatient. No further workup required in the hospital.       Code Status: full   Family Communication:    Disposition Plan:    SIGNIFICANT EVENTS / STUDIES:  1/21- admitted  1/23 - CT Chest . No evidence of malignancy or other significant abnormality of the chest  1/24 - MRI C-spine > No evidence of cervical disc herniation. Minimal degenerative changes upper thoracic spine  1/27 - J tube placed  2/3 - Small bowel resection and J-tube revision.  2/11 - Abd xray > Persistent ileus  07/05/2013 echocardiogram stable EF of 60% 11/16/2013 Lower extremity venous duplex no DVT   LINES / TUBES:  R Permacath 1/22 >>>  R PICC 1/20 >>>  Jtube 1/27 >>>  Flexiseal    CULTURES:  C-Diff 1/21 > Neg  Stool 1/21 > Neg  C-diff 1/25 > Neg  Urine 1/25 > Neg  ______________  ______________  Blood 2/4 > Neg  C-Diff 2/7 > Neg  Blood 2/11 >>>ng  Urine 2/11 >>>  C-diff 2/11 >>>neg  Blood 1/25 > Neg Blood 2/11 > gram -ve rods    Consults  Gen. surgery, neurology, PC CM, GYN   Medications  Scheduled Meds: . aztreonam  1 g Intravenous 3 times per day  . chlorhexidine  15 mL Mouth Rinse BID  . ciprofloxacin  400 mg Intravenous Q12H  . enoxaparin (LOVENOX) injection  40 mg Subcutaneous Q24H  . free water  140 mL Per Tube QID  . hydrocortisone sod succinate (SOLU-CORTEF) inj  50 mg Intravenous Q12H  . levETIRAcetam  1,000 mg Intravenous Q12H  . loperamide  2 mg Per Tube BID  . metoprolol tartrate  50 mg Oral BID  .  mirtazapine  15 mg Per Tube QHS  . pantoprazole sodium  40 mg Per Tube Q1200  . risperiDONE  0.5 mg Oral BID  . sodium chloride  10 mL Intravenous Q12H  . valproate sodium  500 mg Intravenous 3 times per day   Continuous Infusions: . citrate dextrose    . dextrose 30 mL/hr at 07/12/13 0800  . feeding supplement (VITAL AF 1.2 CAL) Stopped (07/12/13 0000)  . phenylephrine (NEO-SYNEPHRINE) Adult infusion 10 mcg/min (07/11/13 1400)   PRN Meds:.acetaminophen, acetaminophen, HYDROmorphone (DILAUDID) injection, loperamide, metoprolol, ondansetron, promethazine, sodium chloride, sodium chloride  DVT Prophylaxis Lovenox   Lab Results  Component Value Date   PLT 123* 07/12/2013    Antibiotics    Anti-infectives   Start     Dose/Rate Route Frequency Ordered Stop   07/11/13 1230  ciprofloxacin (CIPRO) IVPB 400 mg     400 mg 200 mL/hr over 60 Minutes Intravenous Every 12 hours 07/11/13 1151     07/10/13 2100  vancomycin (VANCOCIN) IVPB 750 mg/150 ml premix  Status:  Discontinued     750 mg 150 mL/hr over 60 Minutes Intravenous Every 8 hours 07/10/13 1215 07/11/13 1103   07/10/13 1400  aztreonam (AZACTAM) 1 g in dextrose 5 % 50 mL IVPB     1 g 100 mL/hr over 30 Minutes Intravenous 3 times per day 07/10/13 1215     07/10/13 1230  vancomycin (VANCOCIN) IVPB 1000 mg/200 mL premix     1,000 mg 200 mL/hr over 60 Minutes Intravenous  Once 07/10/13 1215 07/10/13 1422   07/02/13 1330  ciprofloxacin (CIPRO) IVPB 400 mg     400 mg 200 mL/hr over 60 Minutes Intravenous To Surgery 07/02/13 1321 07/02/13 1326   06/23/13 1400  piperacillin-tazobactam (ZOSYN) IVPB 3.375 g  Status:  Discontinued     3.375 g 12.5 mL/hr over 240 Minutes Intravenous 3 times per day 06/23/13 1336 06/26/13 1441   06/20/13 1200  vancomycin (VANCOCIN) IVPB 1000 mg/200 mL premix     1,000 mg 200 mL/hr over 60 Minutes Intravenous  Once 06/20/13 1146 06/20/13 1433          Subjective:   Coralie Carpen today is in hospital  bed, does not appear to be in any discomfort however appears febrile, unable to answer questions  Objective:   Filed Vitals:   07/12/13 0700 07/12/13 0800 07/12/13 0900 07/12/13 1000  BP: 143/75  137/65 133/69  Pulse: 138 140 135 135  Temp:  98.9 F (37.2 C)    TempSrc:  Oral    Resp: $Remo'15 16 13 11  'hfsvA$ Height:      Weight:      SpO2: 97% 100% 100% 96%    Wt Readings from Last 3 Encounters:  07/12/13 83.8 kg (184 lb 11.9 oz)  07/12/13 83.8 kg (184 lb 11.9 oz)  07/12/13 83.8 kg (184 lb 11.9 oz)     Intake/Output Summary (Last 24 hours) at 07/12/13 1059 Last data filed at 07/12/13 1000  Gross per 24 hour  Intake 3012.1 ml  Output      0 ml  Net 3012.1 ml     Physical Exam  Awake, not alert, No new F.N deficits, Normal affect .AT,PERRAL Supple Neck,No JVD, No cervical lymphadenopathy appriciated.  Symmetrical Chest wall movement, Good air movement bilaterally, CTAB RRR,No Gallops,Rubs or new Murmurs, No Parasternal Heave +ve B.Sounds, Abd Soft has PEG tube in place, recent surgery scar stable with staples, Non tender, No organomegaly appriciated, No rebound - guarding or rigidity. No Cyanosis, Clubbing or edema, No new Rash or bruise      Data Review   Micro Results Recent Results (from the past 240 hour(s))  CULTURE, BLOOD (ROUTINE X 2)     Status: None   Collection Time    07/03/13  6:40 AM      Result Value Ref Range Status   Specimen Description BLOOD LEFT HAND   Final   Special Requests BOTTLES DRAWN AEROBIC ONLY 2CC   Final   Culture  Setup Time     Final   Value: 07/03/2013 13:19     Performed at [facility]   Culture     Final   Value: NO GROWTH 5 DAYS     Performed at [facility]   Report Status 07/09/2013 FINAL   Final  CULTURE, BLOOD (ROUTINE X 2)     Status: None   Collection Time    07/03/13  6:55 AM      Result Value Ref Range Status   Specimen Description BLOOD LEFT ARM   Final   Special Requests BOTTLES DRAWN AEROBIC ONLY  1CC   Final   Culture  Setup Time     Final   Value: 07/03/2013 13:19     Performed at [facility]   Culture     Final   Value: NO GROWTH 5 DAYS     Performed at [facility]   Report Status 07/09/2013 FINAL   Final  CLOSTRIDIUM DIFFICILE BY PCR     Status: None   Collection Time    07/06/13 10:27 AM      Result Value Ref Range Status   C difficile by pcr NEGATIVE  NEGATIVE Final  URINE CULTURE     Status: None   Collection Time    07/10/13  8:15 AM      Result Value Ref Range Status   Specimen Description URINE, CATHETERIZED   Final   Special Requests NONE   Final   Culture  Setup Time     Final   Value: 07/10/2013 09:13     Performed at [facility]     Final   Value: 50,000 COLONIES/ML     Performed at [facility]   Culture     Final   Value: GRAM NEGATIVE RODS     YEAST     Performed at [facility]   Report Status PENDING   Incomplete  CLOSTRIDIUM DIFFICILE BY PCR  Status: None   Collection Time    07/10/13  8:33 AM      Result Value Ref Range Status   C difficile by pcr NEGATIVE  NEGATIVE Final  CULTURE, BLOOD (ROUTINE X 2)     Status: None   Collection Time    07/10/13 10:55 AM      Result Value Ref Range Status   Specimen Description BLOOD LEFT ARM   Final   Special Requests BOTTLES DRAWN AEROBIC ONLY 8CC   Final   Culture  Setup Time     Final   Value: 07/10/2013 16:23     Performed at [facility]   Culture     Final   Value: GRAM NEGATIVE RODS     Note: Gram Stain Report Called to,Read Back By and Verified With: Vesta 07/11/13 AT 0330 RIDK     Performed at [facility]   Report Status PENDING   Incomplete  MRSA PCR SCREENING     Status: None   Collection Time    07/10/13 12:55 PM      Result Value Ref Range Status   MRSA by PCR NEGATIVE  NEGATIVE Final   Comment:            The GeneXpert MRSA Assay (FDA     approved for NASAL specimens     only), is one component of a       comprehensive MRSA colonization     surveillance program. It is not     intended to diagnose MRSA     infection nor to guide or     monitor treatment for     MRSA infections.    Radiology Reports Dg Abd 1 View  06/27/2013   CLINICAL DATA:  Nausea, abdominal pain, vomiting  EXAM: ABDOMEN - 1 VIEW  COMPARISON:  None.  FINDINGS: There is a surgical drain present in the mid abdomen. There are surgical stated in the right paramedian abdominal wall. There is a small amount of contrast scattered throughout the colon. There is no bowel dilatation to suggest obstruction. There is no evidence of pneumoperitoneum, portal venous gas or pneumatosis. There are no pathologic calcifications along the expected course of the ureters.The osseous structures are unremarkable.  IMPRESSION: Nonobstructive bowel gas pattern.   Electronically Signed   By: Kathreen Devoid   On: 06/27/2013 09:34      Ct Head Wo Contrast  06/13/2013   CLINICAL DATA:  Altered mental status. Failure to thrive. Seizures.  EXAM: CT HEAD WITHOUT CONTRAST  TECHNIQUE: Contiguous axial images were obtained from the base of the skull through the vertex without intravenous contrast.  COMPARISON:  05/13/2013  FINDINGS: There is no evidence of intracranial hemorrhage, brain edema, or other signs of acute infarction. There is no evidence of intracranial mass lesion or mass effect. No abnormal extraaxial fluid collections are identified.  Mild diffuse cerebral atrophy is stable. No evidence hydrocephalus. No other intracranial abnormality identified. No skull fracture or other bone lesion identified.  IMPRESSION: No acute intracranial findings.  Stable mild cerebral atrophy.   Electronically Signed   By: Earle Gell M.D.   On: 06/13/2013 19:53     Ct Chest W Contrast  06/21/2013   CLINICAL DATA:  Altered mental status.  Evaluate for malignancy.  EXAM: CT CHEST WITH CONTRAST  TECHNIQUE: Multidetector CT imaging of the chest was performed during  intravenous contrast administration.  CONTRAST:  90mL OMNIPAQUE IOHEXOL 300 MG/ML  SOLN  COMPARISON:  Chest x-ray dated 05/11/2013  FINDINGS: Heart size and pulmonary vascularity are normal. There is no hilar or mediastinal adenopathy. The lungs are clear. No mass lesions, infiltrates, or effusions. Soft tissues of the chest are normal. Thyroid gland is normal. No acute osseous abnormality. Osteophytes fuse the thoracic spine from T6 through T10.  IMPRESSION: No evidence of malignancy or other significant abnormality of the chest.   Electronically Signed   By: Rozetta Nunnery M.D.   On: 06/21/2013 17:13     Mr Jeri Cos IR Contrast  06/20/2013   CLINICAL DATA:  Altered mental status. Possible autoimmune encephalitis.  EXAM: MRI HEAD WITHOUT AND WITH CONTRAST  TECHNIQUE: Multiplanar, multiecho pulse sequences of the brain and surrounding structures were obtained without and with intravenous contrast.  CONTRAST:  73mL MULTIHANCE GADOBENATE DIMEGLUMINE 529 MG/ML IV SOLN  COMPARISON:  Head CT 06/13/2013 and brain MRI 05/16/2013  FINDINGS: Images are mildly to moderately degraded by motion artifact.  Incidental note is again made of a partially empty sella. No definite residual abnormal T2 or diffusion weighted signal is identified in the thalami. Mild periventricular T2 hyperintensity is unchanged and nonspecific. No new areas of brain parenchymal signal abnormality are identified. There is mild cerebral atrophy. There is no evidence of acute infarct. There is no evidence of mass, midline shift, or extra-axial fluid collection. Orbits are unremarkable. Major intracranial vascular flow voids are unremarkable. Visualized paranasal sinuses and mastoid air cells are clear. There is no abnormal enhancement.  IMPRESSION: Interval resolution of thalamic signal abnormality. No evidence of new intracranial abnormality.   Electronically Signed   By: Logan Bores   On: 06/20/2013 17:26    Mr Cervical Spine Wo  Contrast  06/22/2013   CLINICAL DATA:  Right hemiparesis. Possible autoimmune encephalitis.  EXAM: MRI CERVICAL SPINE WITHOUT CONTRAST  TECHNIQUE: Multiplanar, multisequence MR imaging was performed. No intravenous contrast was administered.  COMPARISON:  No comparison cervical spine MR. brain MR 06/20/2013.  FINDINGS: On this motion grade examination, artifact extends through the cervical cord however, no definitive cervical cord signal abnormality is noted. Cervical medullary junction unremarkable. Intracranial structures as detailed on recent MR of the brain.  Visualized paravertebral structures unremarkable. Both vertebral arteries are patent.  C2-3:  Negative.  C3-4:  Minimal right foraminal narrowing.  C4-5:  Minimal right foraminal narrowing.  C5-6:  Negative.  C6-7:  Negative.  C7-T1:  Negative.  T1-2:  Negative.  T2-3: Minimal Schmorl's node deformity.  Minimal bulge.  T3-4: Minimal bulge.  T4-5: Minimal bulge.  IMPRESSION: No evidence of cervical disc herniation.  Minimal degenerative changes upper thoracic spine.  Please see above.   Electronically Signed   By: Chauncey Cruel M.D.   On: 06/22/2013 19:38    US Transvaginal Non-ob  06/21/2013   CLINICAL DATA:  Altered mental status. Evaluation for possible paraneoplastic syndrome.  EXAM: TRANSABDOMINAL AND TRANSVAGINAL ULTRASOUND OF PELVIS  TECHNIQUE: Both transabdominal and transvaginal ultrasound examinations of the pelvis were performed. Transabdominal technique was performed for global imaging of the pelvis including uterus, ovaries, adnexal regions, and pelvic cul-de-sac. It was necessary to proceed with endovaginal exam following the transabdominal exam to visualize the ovaries.  COMPARISON:  None  FINDINGS: Uterus  Removed.  Right ovary  Not visualized.  Left ovary  Not visualized.  Other findings  No free fluid.  IMPRESSION: The ovaries could not be visualized on transvaginal or transabdominal pelvic ultrasound. However, review of the CT scan of  the pelvis dated 05/17/2013 does demonstrate  that both ovaries appear normal, measuring 23 mm on the right and 16 mm on the left.   Electronically Signed   By: Rozetta Nunnery M.D.   On: 06/21/2013 16:45   US Pelvis Complete  06/21/2013   CLINICAL DATA:  Altered mental status. Evaluation for possible paraneoplastic syndrome.  EXAM: TRANSABDOMINAL AND TRANSVAGINAL ULTRASOUND OF PELVIS  TECHNIQUE: Both transabdominal and transvaginal ultrasound examinations of the pelvis were performed. Transabdominal technique was performed for global imaging of the pelvis including uterus, ovaries, adnexal regions, and pelvic cul-de-sac. It was necessary to proceed with endovaginal exam following the transabdominal exam to visualize the ovaries.  COMPARISON:  None  FINDINGS: Uterus  Removed.  Right ovary  Not visualized.  Left ovary  Not visualized.  Other findings  No free fluid.  IMPRESSION: The ovaries could not be visualized on transvaginal or transabdominal pelvic ultrasound. However, review of the CT scan of the pelvis dated 05/17/2013 does demonstrate that both ovaries appear normal, measuring 23 mm on the right and 16 mm on the left.   Electronically Signed   By: Rozetta Nunnery M.D.   On: 06/21/2013 16:45   Ir Fluoro Guide Cv Line Right  06/20/2013   CLINICAL DATA:  Encephalitis and need for tunneled catheter for plasmapheresis.  EXAM: TUNNELED CENTRAL VENOUS CATHETER PLACEMENT WITH ULTRASOUND AND FLUOROSCOPIC GUIDANCE  MEDICATIONS: 1 g IV vancomycin. Vancomycin was given within two hours of incision. Vancomycin was given due to an antibiotic allergy.  ANESTHESIA/SEDATION: 2.0 mg IV Versed; 100 mcg IV Fentanyl.  Total Moderate Sedation Time  Twenty minutes.  FLUOROSCOPY TIME:  42 seconds.  PROCEDURE: The procedure, risks, benefits, and alternatives were explained to the patient. Questions regarding the procedure were encouraged and answered. The patient understands and consents to the procedure.  The right neck and chest  were prepped with chlorhexidine in a sterile fashion, and a sterile drape was applied covering the operative field. Maximum barrier sterile technique with sterile gowns and gloves were used for the procedure. Local anesthesia was provided with 1% lidocaine.  Ultrasound was used to confirm patency of the right internal jugular vein. After creating a small venotomy incision, a 21 gauge needle was advanced into the right internal jugular vein under direct, real-time ultrasound guidance. Ultrasound image documentation was performed. After securing guidewire access, an 8 Fr dilator was placed. A J-wire was kinked to measure appropriate catheter length.  A Bard Equistream tunneled hemodialysis/pheresis catheter measuring 19 cm from tip to cuff was chosen for placement. This was tunneled in a retrograde fashion from the chest wall to the venotomy incision.  At the venotomy, serial dilatation was performed and a 16 Fr peel-away sheath was placed over a guidewire. The catheter was then placed through the sheath and the sheath removed. Final catheter positioning was confirmed and documented with a fluoroscopic spot image. The catheter was aspirated, flushed with saline, and injected with appropriate volume heparin dwells.  The venotomy incision was closed with subcutaneous 3-0 Monocryl and subcuticular 4-0 Vicryl. Dermabond was applied to the incision. The catheter exit site was secured with 0-Prolene retention sutures.  COMPLICATIONS: None.  No pneumothorax.  FINDINGS: After catheter placement, the tips lie in the right atrium. The catheter aspirates normally and is ready for immediate use.  IMPRESSION: Placement of tunneled pheresis catheter via the right internal jugular vein. The catheter tips lie in the right atrium. The catheter is ready for immediate use.   Electronically Signed   By: Jenness Corner.D.  On: 06/20/2013 14:27   Ir Fluoro Guide Cv Line Right  06/18/2013   EXAM: ULTRASOUND AND FLUOROSCOPIC GUIDED  PICC LINE INSERTION  MEDICATIONS: None.  TECHNIQUE: The procedure, risks, benefits, and alternatives were explained to the patient's son and informed written consent was obtained. A timeout was performed prior to the initiation of the procedure.  The right upper extremity was prepped with chlorhexidine in a sterile fashion, and a sterile drape was applied covering the operative field. Maximum barrier sterile technique with sterile gowns and gloves were used for the procedure. A timeout was performed prior to the initiation of the procedure. Local anesthesia was provided with 1% lidocaine.  Under direct ultrasound guidance, the right brachial vein was accessed with a micropuncture kit after the overlying soft tissues were anesthetized with 1% lidocaine. An ultrasound image was saved for documentation purposes. A guidewire was advanced to the level of the superior caval-atrial junction for measurement purposes and the PICC line was cut to length. A peel-away sheath was placed and a 36 cm, 5 [place], dual lumen was inserted to level of the superior caval-atrial junction. A post procedure spot fluoroscopic was obtained. The catheter easily aspirated and flushed and was sutured in place. A dressing was placed. The patient tolerated the procedure well without immediate post procedural complication.  CONTRAST:  None  FLUOROSCOPY TIME:  36 seconds.  COMPLICATIONS: None immediate  FINDINGS: After catheter placement, the tip lies within the superior cavoatrial junction. The catheter aspirates and flushes normally and is ready for immediate use.  IMPRESSION: Successful ultrasound and fluoroscopic guided placement of a right brachial vein approach, 36 cm, 5 French, dual lumen PICC with tip at the superior caval-atrial junction. The PICC line is ready for immediate use.  INDICATION: Poor venous access, in need of intravenous access for blood draws and medication administration   Electronically Signed   By: Sandi Mariscal M.D.   On:  06/18/2013 15:51      Dg Chest Port 1 View  07/10/2013   CLINICAL DATA:  Shortness of breath.  Hypotension  EXAM: PORTABLE CHEST - 1 VIEW  COMPARISON:  DG CHEST 1V PORT dated 07/03/2013; CT CHEST W/CM dated 06/21/2013  FINDINGS: Right internal jugular dialysis catheter tip: Right atrium. Thoracic spondylosis noted. There is a band of airspace opacity in the right upper lobe just above the minor fissure. Improved aeration at the right lung base.  IMPRESSION: Improved aeration at the right lung base, with but there is a band of airspace opacity in the right upper lobe favoring atelectasis over pneumonia. Next item thoracic spondylosis.   Electronically Signed   By: Sherryl Barters M.D.   On: 07/10/2013 08:27     Dg Chest Port 1 View  07/03/2013   CLINICAL DATA:  Tubes and lines in good position, detailed above.  EXAM: PORTABLE CHEST - 1 VIEW  COMPARISON:  Chest CT 06/21/2013  FINDINGS: There is nasogastric tube which terminates near the pylorus. Right IJ catheter, tip in the upper right atrium. Right upper extremity PICC, tip at the upper cavoatrial junction.  Oral contrast noted within the gastric fundus and small bowel.  Haziness of the right base, with volume loss. Node definitive consolidation.  No cardiomegaly.  IMPRESSION: 1. Negative for pneumonia. 2. Haziness of the right lower chest, likely pleural fluid or atelectasis. 3. Tubes and lines in good position, detailed above.   Electronically Signed   By: Jorje Guild M.D.   On: 07/03/2013 03:35     Dg  Abd Portable 1v  07/10/2013   CLINICAL DATA:  Assess for ileus  EXAM: PORTABLE ABDOMEN - 1 VIEW  COMPARISON:  June 30, 2013  FINDINGS: There is persistent air-filled dilated small bowel loops in the left abdomen. Air is noted in the colon. Skin staples are projected over the right abdomen. Patient is status post prior cholecystectomy.  IMPRESSION: Persistent ileus.   Electronically Signed   By: Abelardo Diesel M.D.   On: 07/10/2013 02:30     Dg  Abd Portable 1v  06/30/2013   CLINICAL DATA:  NG tube placement.  EXAM: PORTABLE ABDOMEN - 1 VIEW  COMPARISON:  Single view of the abdomen 06/27/2013. Upper GI/small bowel follow-through 06/28/2013  FINDINGS: NG tube is in place in good position with the tip in this distal stomach. Contrast material in small bowel from the comparison upper GI series/small bowel follow-through. Small bowel loops are dilated. Only a small amount of loculated barium is seen in the colon.  IMPRESSION: NG tube in good position.  Small bowel obstruction.   Electronically Signed   By: Inge Rise M.D.   On: 06/30/2013 19:01     Dg Addison Bailey G Tube Plc W/fl-no Rad  06/17/2013   CLINICAL DATA: patient to have peg tube placement on January 20th   NASO G TUBE PLACEMENT WITH FLUORO  Fluoroscopy was utilized by the requesting physician.  No radiographic  interpretation.     Dg Ugi W/small Bowel  06/28/2013   CLINICAL DATA:  53 year old female postop 2 days since percutaneous jejunostomy tube placement. Drainage from NG tube. Abdominal pain. Initial encounter.  EXAM: UPPER GI SERIES WITH SMALL BOWEL FOLLOW-THROUGH  FLUOROSCOPIC GUIDED NG TUBE PLACEMENT  TECHNIQUE: Combined double contrast and single contrast upper GI series using effervescent crystals, thick barium, and thin barium. Subsequently, serial images of the small bowel were obtained including spot views of the terminal ileum.  COMPARISON:  KUB 06/27/2013 and earlier.  FLUOROSCOPY TIME:  9 min and 18 seconds.  FINDINGS: Preprocedural scout view of the abdomen demonstrates a small volume of loculated barium in the colon. This is related to the 06/17/2013 NG tube placement/ confirmation study.  Scout view demonstrates the recently placed right an mid lower abdomen percutaneous jejunostomy. Right lower abdomen skin staples in place.  The scout view also demonstrates the patient's NG tube is looped back up into the esophagus. This was evaluated in real-time with fluoroscopy, and a  wire was placed through the tube in an effort to maneuver the tip back into the stomach. However, this was unsuccessful an the tube had be retracted into the nasopharynx. Using fluoroscopic guidance, the tube was then advanced with a wire into the stomach, such that the tip and side hole are within the stomach.  Given the recent abdominal surgery, initially water-soluble contrast was planned, however prominent gastroesophageal reflux of contrast was noted early on, and the decision was made to switch contrast to thin barium. Water-soluble contrast was fully aspirated from the stomach. Subsequently, barium contrast was slowly administered.  The stomach appears small. No gastrojejunostomy is evident. After a short delay, barium emptied the stomach into the proximal duodenum. The second portion of the duodenum is dilated.  After 20 additional min significantly dilated proximal jejunum is opacified, with the small bowel caliber up to 58 mm.  At this point study was discussed with Dr. Judeth Horn . We decided to inject the percutaneous jejunostomy tube, and give the barium more time to opacified a small bowel in hopes  of loose to dating the small bowel obstruction transition point.  Barium injection through the jejunostomy tube demonstrates normal small bowel loops at the jejunostomy site. This barium was then aspirated.  Subsequently a 3 hr delayed image was obtained demonstrating a gradual tapering of small bowel loops into the right lower abdomen, heading toward the vicinity of the jejunostomy. These smaller loops measure 23 mm diameter (arrow).  At this point, Dr. Hulen Skains advised that no additional imaging was needed.  IMPRESSION: 1. High-grade small bowel obstruction. Dilated duodenum and proximal jejunum up to 58 mm diameter. 2. Small bowel loops slowly taper to the right lower abdomen in the vicinity of the percutaneous jejunostomy. 3. The jejunostomy was injected with contrast, and is situated within normal  decompressed small bowel. 4. Diminutive stomach. No gastrojejunostomy is evident. Prominent gastroesophageal reflux. 5. At the beginning of the exam the patient's NG tube was malpositioned. This was withdrawn to the nasopharynx and repositioned into the stomach under fluoroscopic guidance.   Electronically Signed   By: Lars Pinks M.D.   On: 06/28/2013 16:29      CBC  Recent Labs Lab 07/08/13 0430 07/09/13 1055 07/10/13 0540 07/11/13 0500 07/12/13 0500  WBC 5.8 8.1 4.9 7.8 4.8  HGB 8.0* 8.6* 8.4* 6.2* 11.4*  HCT 24.9* 26.7* 25.8* 18.7* 33.5*  PLT 97* 106* 93* 96* 123*  MCV 97.6 98.9 97.7 98.4 90.3  MCH 31.4 31.9 31.8 32.6 30.7  MCHC 32.1 32.2 32.6 33.2 34.0  RDW 17.5* 17.3* 17.0* 17.4* 18.6*    Chemistries   Recent Labs Lab 07/08/13 0430 07/09/13 1055 07/10/13 0540 07/11/13 0500 07/12/13 0500  NA 145 142 137 134* 139  K 4.6 4.6 4.4 2.5* 4.5  CL 111 107 101 108 106  CO2 $Re'27 24 22 'uYn$ 16* 22  GLUCOSE 102* 98 103* 306* 95  BUN <3* <3* 4* 5* 6  CREATININE 0.62 0.61 0.55 0.37* 0.51  CALCIUM 7.9* 7.7* 7.5* 5.0* 8.3*  MG 1.9  --   --   --  1.8  AST  --   --   --   --  19  ALT  --   --   --   --  10  ALKPHOS  --   --   --   --  61  BILITOT  --   --   --   --  0.5   ------------------------------------------------------------------------------------------------------------------ estimated creatinine clearance is 85.4 ml/min (by C-G formula based on Cr of 0.51). ------------------------------------------------------------------------------------------------------------------  Recent Labs  07/12/13 0500  HGBA1C 4.7   ------------------------------------------------------------------------------------------------------------------ No results found for this basename: CHOL, HDL, LDLCALC, TRIG, CHOLHDL, LDLDIRECT,  in the last 72 hours ------------------------------------------------------------------------------------------------------------------ No results found for this  basename: TSH, T4TOTAL, FREET3, T3FREE, THYROIDAB,  in the last 72 hours ------------------------------------------------------------------------------------------------------------------ No results found for this basename: VITAMINB12, FOLATE, FERRITIN, TIBC, IRON, RETICCTPCT,  in the last 72 hours  Coagulation profile No results found for this basename: INR, PROTIME,  in the last 168 hours  No results found for this basename: DDIMER,  in the last 72 hours  Cardiac Enzymes No results found for this basename: CK, CKMB, TROPONINI, MYOGLOBIN,  in the last 168 hours ------------------------------------------------------------------------------------------------------------------ No components found with this basename: POCBNP,      Time Spent in minutes  35   Shondrea Steinert K M.D on 07/12/2013 at 10:59 AM  Between 7am to 7pm - Pager - [phone]  After 7pm go to [email or web address] - password [email or web address]  And look for the night coverage person  covering for me after hours  [facility] Office  [phone]

## 2013-07-12 NOTE — Evaluation (Signed)
Physical Therapy Evaluation Patient Details Name: Joan [name] MRN: [id number] DOB: 11/22/1960 Today's Date: 07/12/2013 Time: 0815-0828 PT Time Calculation (min): 13 min  PT Assessment / Plan / Recommendation History of Present Illness  53 year old female with neuro and psych history presented 1/21 with AMS. PCCM asked to see for hypotension on 2/11.  [facility]  Clinical Impression  Pt pleasant but very confused oriented to person only and stating things like "i'm faced down and flipped over", "I live in the emergency". Pt with HR 135 at rest and RN stated gentle mobility ok as HR not being medicated at this time but even with supine exercises HR up to 154, pt with noted anxiety and did not attempt transfers at this time. Pt with bil UE edema and retrograde massage performed with UE propped on pillows and RN notified. Pt with decreased baseline function as well as acute progression of weakness and AMS. Pt will benefit from PT trial acutely to maximize mobility, strength and function to decrease caregiver burden and maximize activity. Will follow. Recommend lift for OOB with nursing.     PT Assessment  Patient needs continued PT services    Follow Up Recommendations  SNF    Does the patient have the potential to tolerate intense rehabilitation      Barriers to Discharge        Equipment Recommendations  None recommended by PT    Recommendations for Other Services     Frequency Min 2X/week    Precautions / Restrictions Precautions Precautions: Fall   Pertinent Vitals/Pain No pain      Mobility       Exercises General Exercises - Upper Extremity Shoulder Flexion: AAROM;Supine;10 reps;Both General Exercises - Lower Extremity Ankle Circles/Pumps: AAROM;Supine;10 reps;Both Heel Slides: AAROM;Supine;5 reps;Both Hip ABduction/ADduction: AAROM;10 reps;Supine;Both   PT Diagnosis: Generalized weakness;Altered mental status  PT Problem List: Decreased  strength;Decreased cognition;Decreased range of motion;Decreased activity tolerance;Decreased mobility;Cardiopulmonary status limiting activity PT Treatment Interventions: Functional mobility training;Therapeutic activities;Therapeutic exercise;Patient/family education     PT Goals(Current goals can be found in the care plan section) Acute Rehab PT Goals PT Goal Formulation: Patient unable to participate in goal setting Time For Goal Achievement: 07/26/13 Potential to Achieve Goals: Fair  Visit Information  Last PT Received On: 07/12/13 Assistance Needed: +2 History of Present Illness: 53 year old female with neuro and psych history presented 1/21 with AMS. PCCM asked to see for hypotension on 2/11.  [facility]       Prior Functioning  Home Living Family/patient expects to be discharged to:: Skilled nursing facility Prior Function Level of Independence: Needs assistance Gait / Transfers Assistance Needed: pt reports 2 person assist to pivot [facility] / Homemaking Assistance Needed: total assist for ADLs and staff does all housework Communication Communication: No difficulties Dominant Hand: Right    Cognition  Cognition Arousal/Alertness: Awake/alert Behavior During Therapy: Flat affect Overall Cognitive Status: No family/caregiver present to determine baseline cognitive functioning    Extremity/Trunk Assessment Upper Extremity Assessment Upper Extremity Assessment: Generalized weakness Lower Extremity Assessment Lower Extremity Assessment: Generalized weakness   Balance    End of Session PT - End of Session Activity Tolerance: Treatment limited secondary to medical complications (Comment) Patient left: in bed;with call bell/phone within reach Nurse Communication: Mobility status;Need for lift equipment  GP     Joan [name], Joan [name] Beth 07/12/2013, 10:54 AM Delaney MeigsMaija Tabor Havannah [name], PT [phone]

## 2013-07-12 NOTE — Progress Notes (Signed)
OT Cancellation Note  Patient Details Name: Joan [name] MRN: [id number] DOB: 04/18/1961   Cancelled Treatment:    Reason Eval/Treat Not Completed: OT screened, no needs identified, will sign off. Pt is from SNF and plans to return to SNF at d/c.  She is total A for ADLs at baseline and requires +2 total assist for transfers.  Will defer further OT needs to next venue of care.  07/12/2013 Cipriano MileJohnson, Jenna Elizabeth OTR/L Pager [phone] Office [phone]

## 2013-07-12 NOTE — Progress Notes (Signed)
eLink Physician-Brief Progress Note Patient Name: Joan PeelingLora [name] DOB: 09/17/1960 MRN: [id number]  Date of Service  07/12/2013   HPI/Events of Note  J Tube disconnected and then non-functioning for now.  Off TFs with blood sugars in the 80s.  [facility]   e[facility] Interventions  Plan: Change IVFs to D5W at 30 cc/hr Continue to monitor blood sugars   Intervention Category Minor Interventions: Routine modifications to care plan (e.g. PRN medications for pain, fever)  DETERDING,ELIZABETH 07/12/2013, 12:58 AM

## 2013-07-13 ENCOUNTER — Inpatient Hospital Stay (HOSPITAL_COMMUNITY): Payer: Medicare Other

## 2013-07-13 DIAGNOSIS — Z9889 Other specified postprocedural states: Secondary | ICD-10-CM

## 2013-07-13 DIAGNOSIS — A419 Sepsis, unspecified organism: Secondary | ICD-10-CM

## 2013-07-13 DIAGNOSIS — R197 Diarrhea, unspecified: Secondary | ICD-10-CM

## 2013-07-13 DIAGNOSIS — E876 Hypokalemia: Secondary | ICD-10-CM

## 2013-07-13 DIAGNOSIS — E860 Dehydration: Secondary | ICD-10-CM

## 2013-07-13 DIAGNOSIS — R4182 Altered mental status, unspecified: Secondary | ICD-10-CM | POA: Diagnosis not present

## 2013-07-13 DIAGNOSIS — R569 Unspecified convulsions: Secondary | ICD-10-CM

## 2013-07-13 DIAGNOSIS — G0490 Encephalitis and encephalomyelitis, unspecified: Secondary | ICD-10-CM | POA: Diagnosis not present

## 2013-07-13 LAB — GLUCOSE, CAPILLARY
GLUCOSE-CAPILLARY: 80 mg/dL (ref 70–99)
GLUCOSE-CAPILLARY: 81 mg/dL (ref 70–99)
Glucose-Capillary: 119 mg/dL — ABNORMAL HIGH (ref 70–99)
Glucose-Capillary: 88 mg/dL (ref 70–99)
Glucose-Capillary: 96 mg/dL (ref 70–99)

## 2013-07-13 LAB — COMPREHENSIVE METABOLIC PANEL
ALT: 9 U/L (ref 0–35)
AST: 15 U/L (ref 0–37)
Albumin: 1.8 g/dL — ABNORMAL LOW (ref 3.5–5.2)
Alkaline Phosphatase: 53 U/L (ref 39–117)
BUN: 4 mg/dL — ABNORMAL LOW (ref 6–23)
CO2: 24 mEq/L (ref 19–32)
Calcium: 7.7 mg/dL — ABNORMAL LOW (ref 8.4–10.5)
Chloride: 111 mEq/L (ref 96–112)
Creatinine, Ser: 0.51 mg/dL (ref 0.50–1.10)
GFR calc Af Amer: >90 mL/min (ref >90)
GFR calc non Af Amer: >90 mL/min (ref >90)
Glucose, Bld: 89 mg/dL (ref 70–99)
Potassium: 3.2 mEq/L — ABNORMAL LOW (ref 3.7–5.3)
Sodium: 144 mEq/L (ref 137–147)
Total Bilirubin: 0.3 mg/dL (ref 0.3–1.2)
Total Protein: 4.2 g/dL — ABNORMAL LOW (ref 6.0–8.3)

## 2013-07-13 LAB — CULTURE, BLOOD (ROUTINE X 2)

## 2013-07-13 LAB — CBC
HCT: 31.5 % — ABNORMAL LOW (ref 36.0–46.0)
Hemoglobin: 10.7 g/dL — ABNORMAL LOW (ref 12.0–15.0)
MCH: 30.8 pg (ref 26.0–34.0)
MCHC: 34 g/dL (ref 30.0–36.0)
MCV: 90.8 fL (ref 78.0–100.0)
Platelets: 153 10*3/uL (ref 150–400)
RBC: 3.47 MIL/uL — ABNORMAL LOW (ref 3.87–5.11)
RDW: 18.2 % — ABNORMAL HIGH (ref 11.5–15.5)
WBC: 5 10*3/uL (ref 4.0–10.5)

## 2013-07-13 LAB — URINE CULTURE

## 2013-07-13 MED ORDER — CLONIDINE HCL 0.1 MG/24HR TD PTWK
0.1000 mg | MEDICATED_PATCH | TRANSDERMAL | Status: DC
  Administered 2013-07-13: 0.1 mg via TRANSDERMAL
  Filled 2013-07-13: qty 1

## 2013-07-13 MED ORDER — MAGNESIUM SULFATE IN D5W 10-5 MG/ML-% IV SOLN
1.0000 g | Freq: Once | INTRAVENOUS | Status: AC
  Administered 2013-07-13: 1 g via INTRAVENOUS
  Filled 2013-07-13: qty 100

## 2013-07-13 MED ORDER — METOPROLOL TARTRATE 100 MG PO TABS
100.0000 mg | ORAL_TABLET | Freq: Two times a day (BID) | ORAL | Status: DC
  Administered 2013-07-13 – 2013-07-16 (×6): 100 mg via ORAL
  Filled 2013-07-13 (×7): qty 1

## 2013-07-13 MED ORDER — IOHEXOL 300 MG/ML  SOLN
50.0000 mL | Freq: Once | INTRAMUSCULAR | Status: AC | PRN
  Administered 2013-07-13: 15 mL via INTRAVENOUS

## 2013-07-13 MED ORDER — POTASSIUM CHLORIDE 10 MEQ/50ML IV SOLN
INTRAVENOUS | Status: AC
  Administered 2013-07-13: 10 meq
  Filled 2013-07-13: qty 100

## 2013-07-13 MED ORDER — SODIUM CHLORIDE 0.9 % IV SOLN
125.0000 mg | Freq: Every day | INTRAVENOUS | Status: DC
  Administered 2013-07-13 – 2013-07-16 (×4): 125 mg via INTRAVENOUS
  Filled 2013-07-13 (×7): qty 10

## 2013-07-13 MED ORDER — METOPROLOL TARTRATE 50 MG PO TABS
50.0000 mg | ORAL_TABLET | Freq: Two times a day (BID) | ORAL | Status: DC
  Administered 2013-07-13: 50 mg via ORAL
  Filled 2013-07-13 (×2): qty 1

## 2013-07-13 MED ORDER — POTASSIUM CHLORIDE 10 MEQ/100ML IV SOLN
10.0000 meq | INTRAVENOUS | Status: AC
  Administered 2013-07-13 (×6): 10 meq via INTRAVENOUS
  Filled 2013-07-13 (×4): qty 100

## 2013-07-13 NOTE — Progress Notes (Signed)
11 Days Post-Op  Subjective: TF off, feels about the same  Objective: Vital signs in last 24 hours: Temp:  [97.9 F (36.6 C)-98.9 F (37.2 C)] 98.4 F (36.9 C) (02/14 0800) Pulse Rate:  [106-150] 121 (02/14 0807) Resp:  [6-28] 12 (02/14 0807) BP: (117-218)/(65-188) 218/188 mmHg (02/14 0807) SpO2:  [93 %-100 %] 100 % (02/14 0807) Weight:  [181 lb 14.1 oz (82.5 kg)] 181 lb 14.1 oz (82.5 kg) (02/14 0620) Last BM Date: 07/12/13  Intake/Output from previous day: 02/13 0701 - 02/14 0700 In: 2230 [I.V.:1455; IV Piggyback:775] Out: -  Intake/Output this shift: Total I/O In: 10 [I.V.:10] Out: -  PE General appearance: alert, cooperative and no distress GI: soft, non-tender; bowel sounds normal; no masses,  no organomegaly  Lab Results:   Recent Labs  07/12/13 0500 07/13/13 0500  WBC 4.8 5.0  HGB 11.4* 10.7*  HCT 33.5* 31.5*  PLT 123* 153   BMET  Recent Labs  07/12/13 0500 07/13/13 0500  NA 139 144  K 4.5 3.2*  CL 106 111  CO2 22 24  GLUCOSE 95 89  BUN 6 4*  CREATININE 0.51 0.51  CALCIUM 8.3* 7.7*   PT/INR No results found for this basename: LABPROT, INR,  in the last 72 hours ABG No results found for this basename: PHART, PCO2, PO2, HCO3,  in the last 72 hours  Studies/Results: Ct Angio Chest Pe W/cm &/or Wo Cm  07/11/2013   CLINICAL DATA:  Altered mental status, tachycardia, chest pain and shortness breath  EXAM: CT ANGIOGRAPHY CHEST WITH CONTRAST  TECHNIQUE: Multidetector CT imaging of the chest was performed using the standard protocol during bolus administration of intravenous contrast. Multiplanar CT image reconstructions and MIPs were obtained to evaluate the vascular anatomy.  CONTRAST:  OMNIPAQUE IOHEXOL 350 MG/ML SOLN  COMPARISON:  DG CHEST 1V PORT dated 07/11/2013; CT ABD/PELV WO CM dated 07/10/2013; CT CHEST W/CM dated 06/21/2013  FINDINGS: There are no filling defects within the pulmonary arteries to suggest acute pulmonary embolism. Acute findings  of the aorta or great vessels. No pericardial fluid.  There are bilateral pleural effusions, moderate to large on the right and small on the left. There is associated right lower lobe passive atelectasis.  Limited view of the upper abdomen is unremarkable. Limited view of the skeleton demonstrates degenerative spurring.  Review of the MIP images confirms the above findings.  IMPRESSION: 1. No evidence acute pulmonary embolism. 2. Moderate to large right pleural effusion with associated passive atelectasis. 3. No pericardial fluid.   Electronically Signed   By: Genevive Bi M.D.   On: 07/11/2013 17:53   Ir Removal Tun Cv Cath W/o Fl  07/11/2013   CLINICAL DATA:  Sepsis, concern for right internal jugular tunneled central catheter infection, finished plasmapheresis treatment, request for removal.  EXAM: REMOVAL OF TUNNELED CENTRAL VENOUS CATHETER  PROCEDURE: Risks and benefits of procedure discussed with the patient's son over the phone, verbal consent was obtained.  The right chest tunneled central catheter site was prepped with chlorhexidine. A sterile gown and gloves were worn during the procedure.  Utilizing manual manipulation, the subcutaneous cuff of the central catheter was freed. The catheter was then successfully removed in its entirety. A sterile dressing was applied over the catheter exit site.  IMPRESSION: Removal of tunneled right internal jugular central catheter utilizing manual manipulation. The procedure was uncomplicated.  Read By:  Pattricia Boss PA-C   Electronically Signed   By: Malachy Moan M.D.   On:  07/11/2013 11:39   Dg Abd Portable 1v  07/12/2013   CLINICAL DATA:  Abdominal pain  EXAM: PORTABLE ABDOMEN - 1 VIEW  COMPARISON:  07/11/2013  FINDINGS: Postsurgical changes are again seen. A feeding catheter is noted. Scattered large and small bowel gas is seen. No obstructive changes are noted. No definitive free air is seen.  IMPRESSION: No significant change from the prior exam.    Electronically Signed   By: Alcide CleverMark  Lukens M.D.   On: 07/12/2013 07:44    Anti-infectives: Anti-infectives   Start     Dose/Rate Route Frequency Ordered Stop   07/11/13 1230  ciprofloxacin (CIPRO) IVPB 400 mg     400 mg 200 mL/hr over 60 Minutes Intravenous Every 12 hours 07/11/13 1151     07/10/13 2100  vancomycin (VANCOCIN) IVPB 750 mg/150 ml premix  Status:  Discontinued     750 mg 150 mL/hr over 60 Minutes Intravenous Every 8 hours 07/10/13 1215 07/11/13 1103   07/10/13 1400  aztreonam (AZACTAM) 1 g in dextrose 5 % 50 mL IVPB  Status:  Discontinued     1 g 100 mL/hr over 30 Minutes Intravenous 3 times per day 07/10/13 1215 07/13/13 1111   07/10/13 1230  vancomycin (VANCOCIN) IVPB 1000 mg/200 mL premix     1,000 mg 200 mL/hr over 60 Minutes Intravenous  Once 07/10/13 1215 07/10/13 1422   07/02/13 1330  ciprofloxacin (CIPRO) IVPB 400 mg     400 mg 200 mL/hr over 60 Minutes Intravenous To Surgery 07/02/13 1321 07/02/13 1326   06/23/13 1400  piperacillin-tazobactam (ZOSYN) IVPB 3.375 g  Status:  Discontinued     3.375 g 12.5 mL/hr over 240 Minutes Intravenous 3 times per day 06/23/13 1336 06/26/13 1441   06/20/13 1200  vancomycin (VANCOCIN) IVPB 1000 mg/200 mL premix     1,000 mg 200 mL/hr over 60 Minutes Intravenous  Once 06/20/13 1146 06/20/13 1433      Assessment/Plan: S/P J tube revision Dr Janee Mornhompson manipulated at bedside yesterday with a wire.  Some bilious return but wouldn't flush well.  Will discuss with IR.  Hypokalemia-resolved    LOS: 24 days    Joan [name] C.  07/13/2013 11:14 AM

## 2013-07-13 NOTE — Progress Notes (Addendum)
Patient Demographics  Joan [name], is a 53 y.o. female, DOB - 1961/05/05, PNT:[id number]  Admit date - 06/19/2013   Admitting Physician Charlynne Cousins, MD  Outpatient Primary MD for the patient is [name],[name], MD  LOS - 24   No chief complaint on file.       Assessment & Plan    Assumed care of the patient on 07/10/2013 on day 21 of her hospital stay   HPI/Subjective:   53 yr old female presents with a CC of Altered mental status. Pt has a PMH of peripheral neuropathy, seizures, catatonia, anxiety, HTN, and fibromyalgia. Pt was previously admitted on 12/13 thru 12/28 with toxic metabolic encephalopathy. 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 EEG showed no recurrent seizures. A 24 hour EEG showed an intermittent delta pattern felt to represent Frontal Intermittent Rhythmic Delta Activity. Pt was started on two antiepileptics.     This admission, she was again found altered and seemed "comatose" . Since d/c in December, pt has had persistent confusion and is confined to a wheelchair at [facility]. She is currently confused and experiencing visual hallucinations.She is s/p J tube placement and followed with intractable nausea, vomiting. Surgery following, . ON 2/3 night pt developed fever of 101.4, became tachycardic and hypotensive. CXR, done , UA ordered and blood cultures negative so far. Her work up so far negative for infection. H&H remains stable at 8.5 , and she was found to have mild leukocytosis.     Patient subsequently on 07/10/2013 was seen, she was found to be persistently hypotensive, tachycardic, febrile, despite 3 L of IV fluids her blood pressure stayed around 80 systolic, pulmonary critical care was consulted and  patient was transferred to step down unit. Possible sources of infection aspiration with hospital-acquired pneumonia, intra-abdominal sepsis due to recent J-tube reversal, dialysis catheter which has been placed about 2 weeks ago.       Assessment/Plan:    Sepsis on 07/10/2013 - she was found to be persistently hypotensive, tachycardic, febrile, was transferred to step down unit to 07/10/2013, critical care team was consulted, Gram bacteria on 1/2 B cultures, this likely UTI versus dialysis catheter related infection (although only 50,000 colonies growing in the urine the species and sensitivity are identical so infection more likely UTI)  also had a recent intra-abdominal sepsis due to recent J-tube reversal, and had a dialysis catheter which has been placed about 2 weeks ago. Change foley 07-11-13.  Been placed on appropriate broad-spectrum antibiotics with improvement, noted cultures and has left her on IV Cipro only at this time, dialysis catheter removed on 07/11/2013, blood pressure better with IV fluids, lactic acid improvement in critical care monitoring the patient also. General surgery on board for J-tube reversal surgery. Abdominal x-rays appear stable.  Will require total 14 days of antibiotics for gram-negative bacteremia. Antibiotics start date 07/10/2013     Anemia   Unclear etiology, s/p 2 unit s of prbc transfusion and started her on protonix . Her stool for occult blood negative in the last 2 weeks. Anemia panel showed severe iron deficiency.  We'll replace IV IRON ON 2/15/ 2015 AS NOW SEPSIS IN GOOD CONTROL.      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 Pt received diatech catheter on 06/20/2013 placement and received and finished plasmapheresis returns, case discussed with neurologist Dr. Doy Mince on 07/11/2013.  -MRI of the brain showed no  acute abnormality.-Ammonia level is normal . [facility] paraneoplastic panel reported to be negative, MRI of the C-spine is negative for acute findings   - We'll continue to monitor with supportive care, so far minimal improvement.  - start PT-OT       Tachycardia  - Has been problem for the last several weeks, despite clinical improvement in sepsis she remains in sinus tachycardia, volume status appears appropriate, does not appear to be in any pain or discomfort, stable TSH and echogram, CT angina negative for PE. -Place on on po metoprolol IV while n.p.o.      Hx of Convulsions/Seizures  -Pt has a PMH of seizures and catatonia.  -MRI of the brain showed thalamic signal abnormality during previous admission. No longer present on current MRI.  -Pt currently taking Keppra and Depakene. Increased Depakene to 583m tid      Hypokalemia and hypocalcemia  - Repleted and recheck     Nausea&Vomiting recent J-tube surgery also initially had some diarrhea   C. difficile negative and diarrhea has improved, supportive care for that. Abdominal x-rays do not show any evidence of obstruction, J-tube clogs intermittently and being managed by general surgery. Once appropriate for use we will commence medications via J-tube along with nutrition and free water flushes. -Will have speech therapy evaluate and advise on oral intake      Hx of Anxiety  -Continue pt on Remeron  -Patient appears to have had a psychiatric history  -Current illness is bringing out psychiatric symptoms.  -Psychiatry consulted as psychotropic medications may help with her current symptoms.  -Psych prescribed Risperdal .5 mg bid  Ativan prn for anxiety.       Hypertension  -Pt has a PMH of HTN  -Likely secondary to anxiety  Continue metoprolol and prn hydralazine.       Malnutrition with history of Gastric Bypass  -Per SNF patient has had no PO intake for over 1 week prior to admission.  -SNF sent her  to hospital for J tube placement.  -Gen Surg consulted, J-tube placed and underwent revision of j tube with exploratory laparotomy on 2/3 and small bowel resection.      Recent hx of c-diff (05/18/13)  -Pt negative for C-diff at this admit      Thrombocytopenia  -No acute bleeding monitor     Enterovaginal fistula  -Reported prior to that this patient had some stools in her vagina.  -Discussed with radiology, no evidence of rectovaginal fistula and the previous imaging, (patient had indicated pelvic scan done previously).  - Consulted GI, and the did not feel that patient will benefit from colonoscopy for the diagnosis of fistula.  - [facility] [place], and they recommend to follow the patient as outpatient. No further workup required in the hospital.       Code Status: full   Family Communication: Updated son in detail over the phone on 07/13/2013   Disposition Plan:    SIGNIFICANT EVENTS / STUDIES:  1/21- admitted  1/23 - CT Chest . No evidence of malignancy or other significant abnormality of the chest  1/24 - MRI C-spine > No evidence of cervical disc herniation.  Minimal degenerative changes upper thoracic spine  1/27 - J tube placed  2/3 - Small bowel resection and J-tube revision.  2/11 - Abd xray > no bowel obstruction, possible mild ileus  07/05/2013 echocardiogram stable EF of 60% 11/16/2013 Lower extremity venous duplex no DVT   LINES / TUBES:  R Permacath 1/22 >>> removed on 07/10/2013 R PICC 1/20 >>> Initial placement Jtube 1/27 >>> expiratory lap with the revision of J-tube on 07/02/2013 by general surgery Flexiseal    CULTURES:  C-Diff 1/21 > Neg  Stool 1/21 > Neg  C-diff 1/25 > Neg  Urine 1/25 > Neg  ______________  ______________  Blood 2/4 > Neg  C-Diff 2/7 > Neg  Blood 2/11 >>>ng  Urine 2/11 >>>  C-diff 2/11 >>>neg  Blood 1/25 > Neg Blood 2/11 > gram -ve rods    Consults  Gen. surgery, neurology, PC CM,  GYN   Medications  Scheduled Meds: . chlorhexidine  15 mL Mouth Rinse BID  . ciprofloxacin  400 mg Intravenous Q12H  . cloNIDine  0.1 mg Transdermal Weekly  . enoxaparin (LOVENOX) injection  40 mg Subcutaneous Q24H  . free water  140 mL Per Tube QID  . levETIRAcetam  1,000 mg Intravenous Q12H  . loperamide  2 mg Per Tube BID  . mirtazapine  15 mg Per Tube QHS  . pantoprazole sodium  40 mg Per Tube Q1200  . potassium chloride  10 mEq Intravenous Q1 Hr x 6  . risperiDONE  0.5 mg Oral BID  . sodium chloride  10 mL Intravenous Q12H  . valproate sodium  500 mg Intravenous 3 times per day   Continuous Infusions: . citrate dextrose    . dextrose 75 mL/hr at 07/12/13 1300  . feeding supplement (VITAL AF 1.2 CAL) Stopped (07/12/13 0000)   PRN Meds:.acetaminophen, acetaminophen, HYDROmorphone (DILAUDID) injection, loperamide, metoprolol, ondansetron, promethazine, sodium chloride, sodium chloride  DVT Prophylaxis Lovenox   Lab Results  Component Value Date   PLT 153 07/13/2013    Antibiotics    Anti-infectives   Start     Dose/Rate Route Frequency Ordered Stop   07/11/13 1230  ciprofloxacin (CIPRO) IVPB 400 mg     400 mg 200 mL/hr over 60 Minutes Intravenous Every 12 hours 07/11/13 1151     07/10/13 2100  vancomycin (VANCOCIN) IVPB 750 mg/150 ml premix  Status:  Discontinued     750 mg 150 mL/hr over 60 Minutes Intravenous Every 8 hours 07/10/13 1215 07/11/13 1103   07/10/13 1400  aztreonam (AZACTAM) 1 g in dextrose 5 % 50 mL IVPB  Status:  Discontinued     1 g 100 mL/hr over 30 Minutes Intravenous 3 times per day 07/10/13 1215 07/13/13 1111   07/10/13 1230  vancomycin (VANCOCIN) IVPB 1000 mg/200 mL premix     1,000 mg 200 mL/hr over 60 Minutes Intravenous  Once 07/10/13 1215 07/10/13 1422   07/02/13 1330  ciprofloxacin (CIPRO) IVPB 400 mg     400 mg 200 mL/hr over 60 Minutes Intravenous To Surgery 07/02/13 1321 07/02/13 1326   06/23/13 1400  piperacillin-tazobactam (ZOSYN)  IVPB 3.375 g  Status:  Discontinued     3.375 g 12.5 mL/hr over 240 Minutes Intravenous 3 times per day 06/23/13 1336 06/26/13 1441   06/20/13 1200  vancomycin (VANCOCIN) IVPB 1000 mg/200 mL premix     1,000 mg 200 mL/hr over 60 Minutes Intravenous  Once 06/20/13 1146 06/20/13 1433          Subjective:  Coralie Carpen today is in hospital bed, does not appear to be in any discomfort however appears febrile, unable to answer questions  Objective:   Filed Vitals:   07/13/13 0620 07/13/13 0700 07/13/13 0800 07/13/13 0807  BP:  173/116 197/162 218/188  Pulse: 143 121 120 121  Temp:   98.4 F (36.9 C)   TempSrc:   Oral   Resp: 19 20 11 12   Height:      Weight: 82.5 kg (181 lb 14.1 oz)     SpO2: 100% 99% 100% 100%    Wt Readings from Last 3 Encounters:  07/13/13 82.5 kg (181 lb 14.1 oz)  07/13/13 82.5 kg (181 lb 14.1 oz)  07/13/13 82.5 kg (181 lb 14.1 oz)     Intake/Output Summary (Last 24 hours) at 07/13/13 1119 Last data filed at 07/13/13 1012  Gross per 24 hour  Intake   2040 ml  Output      0 ml  Net   2040 ml     Physical Exam  Awake, not alert, No new F.N deficits, Normal affect [place].AT,PERRAL Supple Neck,No JVD, No cervical lymphadenopathy appriciated.  Symmetrical Chest wall movement, Good air movement bilaterally, CTAB RRR,No Gallops,Rubs or new Murmurs, No Parasternal Heave +ve B.Sounds, Abd Soft has PEG tube in place, recent surgery scar stable with staples, Non tender, No organomegaly appriciated, No rebound - guarding or rigidity. No Cyanosis, Clubbing or edema, No new Rash or bruise      Data Review   Micro Results Recent Results (from the past 240 hour(s))  CLOSTRIDIUM DIFFICILE BY PCR     Status: None   Collection Time    07/06/13 10:27 AM      Result Value Ref Range Status   C difficile by pcr NEGATIVE  NEGATIVE Final  URINE CULTURE     Status: None   Collection Time    07/10/13  8:15 AM      Result Value Ref Range Status   Specimen  Description URINE, CATHETERIZED   Final   Special Requests NONE   Final   Culture  Setup Time     Final   Value: 07/10/2013 09:13     Performed at [place]     Final   Value: 50,000 COLONIES/ML     Performed at [facility]   Culture     Final   Value: KLEBSIELLA PNEUMONIAE     YEAST     Performed at [facility]   Report Status 07/13/2013 FINAL   Final   Organism ID, Bacteria KLEBSIELLA PNEUMONIAE   Final  CLOSTRIDIUM DIFFICILE BY PCR     Status: None   Collection Time    07/10/13  8:33 AM      Result Value Ref Range Status   C difficile by pcr NEGATIVE  NEGATIVE Final  CULTURE, BLOOD (ROUTINE X 2)     Status: None   Collection Time    07/10/13 10:55 AM      Result Value Ref Range Status   Specimen Description BLOOD LEFT ARM   Final   Special Requests BOTTLES DRAWN AEROBIC ONLY 8CC   Final   Culture  Setup Time     Final   Value: 07/10/2013 16:23     Performed at [facility]   Culture     Final   Value: KLEBSIELLA PNEUMONIAE     Note: Gram Stain Report Called to,Read Back By and Verified With: HOLLY  CHURCH 07/11/13 AT 0330 RIDK     Performed at [facility]   Report Status 07/13/2013 FINAL   Final   Organism ID, Bacteria KLEBSIELLA PNEUMONIAE   Final  MRSA PCR SCREENING     Status: None   Collection Time    07/10/13 12:55 PM      Result Value Ref Range Status   MRSA by PCR NEGATIVE  NEGATIVE Final   Comment:            The GeneXpert MRSA Assay (FDA     approved for NASAL specimens     only), is one component of a     comprehensive MRSA colonization     surveillance program. It is not     intended to diagnose MRSA     infection nor to guide or     monitor treatment for     MRSA infections.    Radiology Reports Dg Abd 1 View  06/27/2013   CLINICAL DATA:  Nausea, abdominal pain, vomiting  EXAM: ABDOMEN - 1 VIEW  COMPARISON:  None.  FINDINGS: There is a surgical drain present in the mid abdomen. There are  surgical stated in the right paramedian abdominal wall. There is a small amount of contrast scattered throughout the colon. There is no bowel dilatation to suggest obstruction. There is no evidence of pneumoperitoneum, portal venous gas or pneumatosis. There are no pathologic calcifications along the expected course of the ureters.The osseous structures are unremarkable.  IMPRESSION: Nonobstructive bowel gas pattern.   Electronically Signed   By: Kathreen Devoid   On: 06/27/2013 09:34      Ct Head Wo Contrast  06/13/2013   CLINICAL DATA:  Altered mental status. Failure to thrive. Seizures.  EXAM: CT HEAD WITHOUT CONTRAST  TECHNIQUE: Contiguous axial images were obtained from the base of the skull through the vertex without intravenous contrast.  COMPARISON:  05/13/2013  FINDINGS: There is no evidence of intracranial hemorrhage, brain edema, or other signs of acute infarction. There is no evidence of intracranial mass lesion or mass effect. No abnormal extraaxial fluid collections are identified.  Mild diffuse cerebral atrophy is stable. No evidence hydrocephalus. No other intracranial abnormality identified. No skull fracture or other bone lesion identified.  IMPRESSION: No acute intracranial findings.  Stable mild cerebral atrophy.   Electronically Signed   By: Earle Gell M.D.   On: 06/13/2013 19:53     Ct Chest W Contrast  06/21/2013   CLINICAL DATA:  Altered mental status.  Evaluate for malignancy.  EXAM: CT CHEST WITH CONTRAST  TECHNIQUE: Multidetector CT imaging of the chest was performed during intravenous contrast administration.  CONTRAST:  29m OMNIPAQUE IOHEXOL 300 MG/ML  SOLN  COMPARISON:  Chest x-ray dated 05/11/2013  FINDINGS: Heart size and pulmonary vascularity are normal. There is no hilar or mediastinal adenopathy. The lungs are clear. No mass lesions, infiltrates, or effusions. Soft tissues of the chest are normal. Thyroid gland is normal. No acute osseous abnormality. Osteophytes fuse the  thoracic spine from T6 through T10.  IMPRESSION: No evidence of malignancy or other significant abnormality of the chest.   Electronically Signed   By: JRozetta NunneryM.D.   On: 06/21/2013 17:13     Mr BJeri CosWCBContrast  06/20/2013   CLINICAL DATA:  Altered mental status. Possible autoimmune encephalitis.  EXAM: MRI HEAD WITHOUT AND WITH CONTRAST  TECHNIQUE: Multiplanar, multiecho pulse sequences of the brain and surrounding structures were obtained without and with intravenous contrast.  CONTRAST:  35m MULTIHANCE GADOBENATE DIMEGLUMINE 529 MG/ML IV SOLN  COMPARISON:  Head CT 06/13/2013 and brain MRI 05/16/2013  FINDINGS: Images are mildly to moderately degraded by motion artifact.  Incidental note is again made of a partially empty sella. No definite residual abnormal T2 or diffusion weighted signal is identified in the thalami. Mild periventricular T2 hyperintensity is unchanged and nonspecific. No new areas of brain parenchymal signal abnormality are identified. There is mild cerebral atrophy. There is no evidence of acute infarct. There is no evidence of mass, midline shift, or extra-axial fluid collection. Orbits are unremarkable. Major intracranial vascular flow voids are unremarkable. Visualized paranasal sinuses and mastoid air cells are clear. There is no abnormal enhancement.  IMPRESSION: Interval resolution of thalamic signal abnormality. No evidence of new intracranial abnormality.   Electronically Signed   By: ALogan Bores  On: 06/20/2013 17:26    Mr Cervical Spine Wo Contrast  06/22/2013   CLINICAL DATA:  Right hemiparesis. Possible autoimmune encephalitis.  EXAM: MRI CERVICAL SPINE WITHOUT CONTRAST  TECHNIQUE: Multiplanar, multisequence MR imaging was performed. No intravenous contrast was administered.  COMPARISON:  No comparison cervical spine MR. brain MR 06/20/2013.  FINDINGS: On this motion grade examination, artifact extends through the cervical cord however, no definitive cervical  cord signal abnormality is noted. Cervical medullary junction unremarkable. Intracranial structures as detailed on recent MR of the brain.  Visualized paravertebral structures unremarkable. Both vertebral arteries are patent.  C2-3:  Negative.  C3-4:  Minimal right foraminal narrowing.  C4-5:  Minimal right foraminal narrowing.  C5-6:  Negative.  C6-7:  Negative.  C7-T1:  Negative.  T1-2:  Negative.  T2-3: Minimal Schmorl's node deformity.  Minimal bulge.  T3-4: Minimal bulge.  T4-5: Minimal bulge.  IMPRESSION: No evidence of cervical disc herniation.  Minimal degenerative changes upper thoracic spine.  Please see above.   Electronically Signed   By: SChauncey CruelM.D.   On: 06/22/2013 19:38    UKoreaTransvaginal Non-ob  06/21/2013   CLINICAL DATA:  Altered mental status. Evaluation for possible paraneoplastic syndrome.  EXAM: TRANSABDOMINAL AND TRANSVAGINAL ULTRASOUND OF PELVIS  TECHNIQUE: Both transabdominal and transvaginal ultrasound examinations of the pelvis were performed. Transabdominal technique was performed for global imaging of the pelvis including uterus, ovaries, adnexal regions, and pelvic cul-de-sac. It was necessary to proceed with endovaginal exam following the transabdominal exam to visualize the ovaries.  COMPARISON:  None  FINDINGS: Uterus  Removed.  Right ovary  Not visualized.  Left ovary  Not visualized.  Other findings  No free fluid.  IMPRESSION: The ovaries could not be visualized on transvaginal or transabdominal pelvic ultrasound. However, review of the CT scan of the pelvis dated 05/17/2013 does demonstrate that both ovaries appear normal, measuring 23 mm on the right and 16 mm on the left.   Electronically Signed   By: JRozetta NunneryM.D.   On: 06/21/2013 16:45   UKoreaPelvis Complete  06/21/2013   CLINICAL DATA:  Altered mental status. Evaluation for possible paraneoplastic syndrome.  EXAM: TRANSABDOMINAL AND TRANSVAGINAL ULTRASOUND OF PELVIS  TECHNIQUE: Both transabdominal and  transvaginal ultrasound examinations of the pelvis were performed. Transabdominal technique was performed for global imaging of the pelvis including uterus, ovaries, adnexal regions, and pelvic cul-de-sac. It was necessary to proceed with endovaginal exam following the transabdominal exam to visualize the ovaries.  COMPARISON:  None  FINDINGS: Uterus  Removed.  Right ovary  Not visualized.  Left ovary  Not visualized.  Other findings  No free fluid.  IMPRESSION: The ovaries could not be visualized on transvaginal or transabdominal pelvic ultrasound. However, review of the CT scan of the pelvis dated 05/17/2013 does demonstrate that both ovaries appear normal, measuring 23 mm on the right and 16 mm on the left.   Electronically Signed   By: Rozetta Nunnery M.D.   On: 06/21/2013 16:45   Ir Fluoro Guide Cv Line Right  06/20/2013   CLINICAL DATA:  Encephalitis and need for tunneled catheter for plasmapheresis.  EXAM: TUNNELED CENTRAL VENOUS CATHETER PLACEMENT WITH ULTRASOUND AND FLUOROSCOPIC GUIDANCE  MEDICATIONS: 1 g IV vancomycin. Vancomycin was given within two hours of incision. Vancomycin was given due to an antibiotic allergy.  ANESTHESIA/SEDATION: 2.0 mg IV Versed; 100 mcg IV Fentanyl.  Total Moderate Sedation Time  Twenty minutes.  FLUOROSCOPY TIME:  42 seconds.  PROCEDURE: The procedure, risks, benefits, and alternatives were explained to the patient. Questions regarding the procedure were encouraged and answered. The patient understands and consents to the procedure.  The right neck and chest were prepped with chlorhexidine in a sterile fashion, and a sterile drape was applied covering the operative field. Maximum barrier sterile technique with sterile gowns and gloves were used for the procedure. Local anesthesia was provided with 1% lidocaine.  Ultrasound was used to confirm patency of the right internal jugular vein. After creating a small venotomy incision, a 21 gauge needle was advanced into the right  internal jugular vein under direct, real-time ultrasound guidance. Ultrasound image documentation was performed. After securing guidewire access, an 8 Fr dilator was placed. A J-wire was kinked to measure appropriate catheter length.  A Bard Equistream tunneled hemodialysis/pheresis catheter measuring 19 cm from tip to cuff was chosen for placement. This was tunneled in a retrograde fashion from the chest wall to the venotomy incision.  At the venotomy, serial dilatation was performed and a 16 Fr peel-away sheath was placed over a guidewire. The catheter was then placed through the sheath and the sheath removed. Final catheter positioning was confirmed and documented with a fluoroscopic spot image. The catheter was aspirated, flushed with saline, and injected with appropriate volume heparin dwells.  The venotomy incision was closed with subcutaneous 3-0 Monocryl and subcuticular 4-0 Vicryl. Dermabond was applied to the incision. The catheter exit site was secured with 0-Prolene retention sutures.  COMPLICATIONS: None.  No pneumothorax.  FINDINGS: After catheter placement, the tips lie in the right atrium. The catheter aspirates normally and is ready for immediate use.  IMPRESSION: Placement of tunneled pheresis catheter via the right internal jugular vein. The catheter tips lie in the right atrium. The catheter is ready for immediate use.   Electronically Signed   By: Aletta Edouard M.D.   On: 06/20/2013 14:27   Ir Fluoro Guide Cv Line Right  06/18/2013   EXAM: ULTRASOUND AND FLUOROSCOPIC GUIDED PICC LINE INSERTION  MEDICATIONS: None.  TECHNIQUE: The procedure, risks, benefits, and alternatives were explained to the patient's son and informed written consent was obtained. A timeout was performed prior to the initiation of the procedure.  The right upper extremity was prepped with chlorhexidine in a sterile fashion, and a sterile drape was applied covering the operative field. Maximum barrier sterile technique  with sterile gowns and gloves were used for the procedure. A timeout was performed prior to the initiation of the procedure. Local anesthesia was provided with 1% lidocaine.  Under direct ultrasound guidance, the right brachial vein was accessed with a micropuncture kit after the overlying soft  tissues were anesthetized with 1% lidocaine. An ultrasound image was saved for documentation purposes. A guidewire was advanced to the level of the superior caval-atrial junction for measurement purposes and the PICC line was cut to length. A peel-away sheath was placed and a 36 cm, 5 [place], dual lumen was inserted to level of the superior caval-atrial junction. A post procedure spot fluoroscopic was obtained. The catheter easily aspirated and flushed and was sutured in place. A dressing was placed. The patient tolerated the procedure well without immediate post procedural complication.  CONTRAST:  None  FLUOROSCOPY TIME:  36 seconds.  COMPLICATIONS: None immediate  FINDINGS: After catheter placement, the tip lies within the superior cavoatrial junction. The catheter aspirates and flushes normally and is ready for immediate use.  IMPRESSION: Successful ultrasound and fluoroscopic guided placement of a right brachial vein approach, 36 cm, 5 French, dual lumen PICC with tip at the superior caval-atrial junction. The PICC line is ready for immediate use.  INDICATION: Poor venous access, in need of intravenous access for blood draws and medication administration   Electronically Signed   By: Sandi Mariscal M.D.   On: 06/18/2013 15:51      Dg Chest Port 1 View  07/10/2013   CLINICAL DATA:  Shortness of breath.  Hypotension  EXAM: PORTABLE CHEST - 1 VIEW  COMPARISON:  DG CHEST 1V PORT dated 07/03/2013; CT CHEST W/CM dated 06/21/2013  FINDINGS: Right internal jugular dialysis catheter tip: Right atrium. Thoracic spondylosis noted. There is a band of airspace opacity in the right upper lobe just above the minor fissure. Improved  aeration at the right lung base.  IMPRESSION: Improved aeration at the right lung base, with but there is a band of airspace opacity in the right upper lobe favoring atelectasis over pneumonia. Next item thoracic spondylosis.   Electronically Signed   By: Sherryl Barters M.D.   On: 07/10/2013 08:27     Dg Chest Port 1 View  07/03/2013   CLINICAL DATA:  Tubes and lines in good position, detailed above.  EXAM: PORTABLE CHEST - 1 VIEW  COMPARISON:  Chest CT 06/21/2013  FINDINGS: There is nasogastric tube which terminates near the pylorus. Right IJ catheter, tip in the upper right atrium. Right upper extremity PICC, tip at the upper cavoatrial junction.  Oral contrast noted within the gastric fundus and small bowel.  Haziness of the right base, with volume loss. Node definitive consolidation.  No cardiomegaly.  IMPRESSION: 1. Negative for pneumonia. 2. Haziness of the right lower chest, likely pleural fluid or atelectasis. 3. Tubes and lines in good position, detailed above.   Electronically Signed   By: Jorje Guild M.D.   On: 07/03/2013 03:35     Dg Abd Portable 1v  07/10/2013   CLINICAL DATA:  Assess for ileus  EXAM: PORTABLE ABDOMEN - 1 VIEW  COMPARISON:  June 30, 2013  FINDINGS: There is persistent air-filled dilated small bowel loops in the left abdomen. Air is noted in the colon. Skin staples are projected over the right abdomen. Patient is status post prior cholecystectomy.  IMPRESSION: Persistent ileus.   Electronically Signed   By: Abelardo Diesel M.D.   On: 07/10/2013 02:30     Dg Abd Portable 1v  06/30/2013   CLINICAL DATA:  NG tube placement.  EXAM: PORTABLE ABDOMEN - 1 VIEW  COMPARISON:  Single view of the abdomen 06/27/2013. Upper GI/small bowel follow-through 06/28/2013  FINDINGS: NG tube is in place in good position with the tip  in this distal stomach. Contrast material in small bowel from the comparison upper GI series/small bowel follow-through. Small bowel loops are dilated. Only a  small amount of loculated barium is seen in the colon.  IMPRESSION: NG tube in good position.  Small bowel obstruction.   Electronically Signed   By: Inge Rise M.D.   On: 06/30/2013 19:01     Dg Addison Bailey G Tube Plc W/fl-no Rad  06/17/2013   CLINICAL DATA: patient to have peg tube placement on January 20th   NASO G TUBE PLACEMENT WITH FLUORO  Fluoroscopy was utilized by the requesting physician.  No radiographic  interpretation.     Dg Ugi W/small Bowel  06/28/2013   CLINICAL DATA:  54 year old female postop 2 days since percutaneous jejunostomy tube placement. Drainage from NG tube. Abdominal pain. Initial encounter.  EXAM: UPPER GI SERIES WITH SMALL BOWEL FOLLOW-THROUGH  FLUOROSCOPIC GUIDED NG TUBE PLACEMENT  TECHNIQUE: Combined double contrast and single contrast upper GI series using effervescent crystals, thick barium, and thin barium. Subsequently, serial images of the small bowel were obtained including spot views of the terminal ileum.  COMPARISON:  KUB 06/27/2013 and earlier.  FLUOROSCOPY TIME:  9 min and 18 seconds.  FINDINGS: Preprocedural scout view of the abdomen demonstrates a small volume of loculated barium in the colon. This is related to the 06/17/2013 NG tube placement/ confirmation study.  Scout view demonstrates the recently placed right an mid lower abdomen percutaneous jejunostomy. Right lower abdomen skin staples in place.  The scout view also demonstrates the patient's NG tube is looped back up into the esophagus. This was evaluated in real-time with fluoroscopy, and a wire was placed through the tube in an effort to maneuver the tip back into the stomach. However, this was unsuccessful an the tube had be retracted into the nasopharynx. Using fluoroscopic guidance, the tube was then advanced with a wire into the stomach, such that the tip and side hole are within the stomach.  Given the recent abdominal surgery, initially water-soluble contrast was planned, however prominent  gastroesophageal reflux of contrast was noted early on, and the decision was made to switch contrast to thin barium. Water-soluble contrast was fully aspirated from the stomach. Subsequently, barium contrast was slowly administered.  The stomach appears small. No gastrojejunostomy is evident. After a short delay, barium emptied the stomach into the proximal duodenum. The second portion of the duodenum is dilated.  After 20 additional min significantly dilated proximal jejunum is opacified, with the small bowel caliber up to 58 mm.  At this point study was discussed with Dr. Judeth Horn . We decided to inject the percutaneous jejunostomy tube, and give the barium more time to opacified a small bowel in hopes of loose to dating the small bowel obstruction transition point.  Barium injection through the jejunostomy tube demonstrates normal small bowel loops at the jejunostomy site. This barium was then aspirated.  Subsequently a 3 hr delayed image was obtained demonstrating a gradual tapering of small bowel loops into the right lower abdomen, heading toward the vicinity of the jejunostomy. These smaller loops measure 23 mm diameter (arrow).  At this point, Dr. Hulen Skains advised that no additional imaging was needed.  IMPRESSION: 1. High-grade small bowel obstruction. Dilated duodenum and proximal jejunum up to 58 mm diameter. 2. Small bowel loops slowly taper to the right lower abdomen in the vicinity of the percutaneous jejunostomy. 3. The jejunostomy was injected with contrast, and is situated within normal decompressed small bowel. 4.  Diminutive stomach. No gastrojejunostomy is evident. Prominent gastroesophageal reflux. 5. At the beginning of the exam the patient's NG tube was malpositioned. This was withdrawn to the nasopharynx and repositioned into the stomach under fluoroscopic guidance.   Electronically Signed   By: Lars Pinks M.D.   On: 06/28/2013 16:29      CBC  Recent Labs Lab 07/09/13 1055  07/10/13 0540 07/11/13 0500 07/12/13 0500 07/13/13 0500  WBC 8.1 4.9 7.8 4.8 5.0  HGB 8.6* 8.4* 6.2* 11.4* 10.7*  HCT 26.7* 25.8* 18.7* 33.5* 31.5*  PLT 106* 93* 96* 123* 153  MCV 98.9 97.7 98.4 90.3 90.8  MCH 31.9 31.8 32.6 30.7 30.8  MCHC 32.2 32.6 33.2 34.0 34.0  RDW 17.3* 17.0* 17.4* 18.6* 18.2*    Chemistries   Recent Labs Lab 07/08/13 0430 07/09/13 1055 07/10/13 0540 07/11/13 0500 07/12/13 0500 07/13/13 0500  NA 145 142 137 134* 139 144  K 4.6 4.6 4.4 2.5* 4.5 3.2*  CL 111 107 101 108 106 111  CO2 27 24 22  16* 22 24  GLUCOSE 102* 98 103* 306* 95 89  BUN <3* <3* 4* 5* 6 4*  CREATININE 0.62 0.61 0.55 0.37* 0.51 0.51  CALCIUM 7.9* 7.7* 7.5* 5.0* 8.3* 7.7*  MG 1.9  --   --   --  1.8  --   AST  --   --   --   --  19 15  ALT  --   --   --   --  10 9  ALKPHOS  --   --   --   --  61 53  BILITOT  --   --   --   --  0.5 0.3   ------------------------------------------------------------------------------------------------------------------ estimated creatinine clearance is 84.6 ml/min (by C-G formula based on Cr of 0.51). ------------------------------------------------------------------------------------------------------------------  Recent Labs  07/12/13 0500  HGBA1C 4.7   ------------------------------------------------------------------------------------------------------------------ No results found for this basename: CHOL, HDL, LDLCALC, TRIG, CHOLHDL, LDLDIRECT,  in the last 72 hours ------------------------------------------------------------------------------------------------------------------ No results found for this basename: TSH, T4TOTAL, FREET3, T3FREE, THYROIDAB,  in the last 72 hours ------------------------------------------------------------------------------------------------------------------ No results found for this basename: VITAMINB12, FOLATE, FERRITIN, TIBC, IRON, RETICCTPCT,  in the last 72 hours  Coagulation profile No results found for  this basename: INR, PROTIME,  in the last 168 hours  No results found for this basename: DDIMER,  in the last 72 hours  Cardiac Enzymes No results found for this basename: CK, CKMB, TROPONINI, MYOGLOBIN,  in the last 168 hours ------------------------------------------------------------------------------------------------------------------ No components found with this basename: POCBNP,      Time Spent in minutes  35   SINGH,PRASHANT K M.D on 07/13/2013 at 11:19 AM  Between 7am to 7pm - Pager - [phone]  After 7pm go to [email or web address] - password [email or web address]  And look for the night coverage person covering for me after hours  [facility] Office  [phone]

## 2013-07-13 NOTE — Procedures (Signed)
Procedure:  Jejunostomy exchange Findings:  10 Fr red Roxan Hockeyobinson cath occluded.  Removed over Glidewire. New 12 Fr red Roxan Hockeyobinson advanced over wire.  Tip in jejunum.  OK to use.

## 2013-07-13 NOTE — Progress Notes (Signed)
eLink Physician-Brief Progress Note Patient Name: Joan PeelingLora [name] DOB: 01/12/1961 MRN: [id number]  Date of Service  07/13/2013   HPI/Events of Note   Low potassium   eICU Interventions  replaced      Henry RusselSMITH, Teea Ducey, P 07/13/2013, 6:21 AM

## 2013-07-13 NOTE — Evaluation (Signed)
Clinical/Bedside Swallow Evaluation Patient Details  Name: Joan [name] MRN: [id number] Date of Birth: 08/25/1960  Today's Date: 07/13/2013 Time: 4098-1191 SLP Time Calculation (min): 17 min  Past Medical History:  Past Medical History  Diagnosis Date  . Peripheral neuropathy   . Fibromyalgia   . Hypertension   . Anxiety   . Catatonia   . Seizures    Past Surgical History:  Past Surgical History  Procedure Laterality Date  . Gastric bypass    . Abdominal hysterectomy    . Jejunostomy N/A 06/25/2013    Procedure:  OPEN JEJUNOSTOMY FEEDING TUBE ;  Surgeon: Cherylynn Ridges, MD;  Location: [facility] OR;  Service: General;  Laterality: N/A;  . Laparotomy N/A 07/02/2013    Procedure: EXPLORATORY LAPAROTOMY with revision of jejunostomy feeding tube.;  Surgeon: Cherylynn Ridges, MD;  Location[facility] OR;  Service: General;  Laterality: N/A;  . Bowel resection N/A 07/02/2013    Procedure: SMALL BOWEL RESECTION;  Surgeon: Cherylynn Ridges, MD;  Location: [facility] OR;  Service: General;  Laterality: N/A;   HPI:  53 yr old female presents with a CC of Altered mental status. Pt has a PMH of peripheral neuropathy, seizures, catatonia, anxiety, HTN, and fibromyalgia. Pt was previously admitted on 12/13 thru 12/28 with toxic metabolic encephalopathy. 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 EEG showed no recurrent seizures. A 24 hour EEG showed an intermittent delta pattern felt to represent Frontal Intermittent Rhythmic Delta Activity. Pt was started on two antiepileptics. This admission, she was again found altered and seemed "comatose" . Since d/c in December, pt has had persistent confusion and is confined to a wheelchair at [facility]. She is currently confused and experiencing visual hallucinations.She is s/p J tube placement and followed with intractable nausea, vomiting. Surgery following, . ON 2/3 night pt developed fever of 101.4, became tachycardic and hypotensive.  CXR, done , UA ordered and blood cultures negative so far. Her work up so far negative for infection. H&H remains stable at 8.5 , and she was found to have mild leukocytosis. Patient subsequently on 07/10/2013 was seen, she was found to be persistently hypotensive, tachycardic, febrile, despite 3 L of IV fluids her blood pressure stayed around 80 systolic, pulmonary critical care was consulted and patient was transferred to step down unit. Possible sources of infection include line infection vs. urological source, less likely aspiration pna. Per chart pts J-Tube not working. Question if pt could take oral meds. Upper Gi on 1/29 showed prominent gastro esophageal reflux with small stomach. BSE on 12/18 showed no evidence of aspiration, pt on Dys 1/thin liquids.    Assessment / Plan / Recommendation Clinical Impression  Pt demonstrates mild cognitive based oral dysphagia with brief holding of puree boluses. Oropharyngeal function appears adequate with no evidence of aspiration or intolerance. Small amount of PO given due to J tube revision today (RN checked with IR prior to ensure small amount of PO could be given). Pt is safe to take oral medication in puree, crushed if she pockets the pill. Expect that pt could tolerate other PO, but concerned about history of intractible nausea and vomiting as well as significant reflux due to diminutive stomach and need for J tube. SLP will f/u Monday to discuss case with MD and determine if PO diet is appopriate.     Aspiration Risk  Mild    Diet Recommendation NPO except meds   Medication Administration: Whole meds with puree  Postural Changes and/or Swallow Maneuvers: Seated upright 90 degrees    Other  Recommendations Oral Care Recommendations: Oral care Q4 per protocol   Follow Up Recommendations  Skilled Nursing facility    Frequency and Duration min 2x/week  2 weeks   Pertinent Vitals/Pain NA    SLP Swallow Goals     Swallow Study Prior  Functional Status       General HPI: 53 yr old female presents with a CC of Altered mental status. Pt has a PMH of peripheral neuropathy, seizures, catatonia, anxiety, HTN, and fibromyalgia. Pt was previously admitted on 12/13 thru 12/28 with toxic metabolic encephalopathy. 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 EEG showed no recurrent seizures. A 24 hour EEG showed an intermittent delta pattern felt to represent Frontal Intermittent Rhythmic Delta Activity. Pt was started on two antiepileptics. This admission, she was again found altered and seemed "comatose" . Since d/c in December, pt has had persistent confusion and [facility]. She is currently confused and experiencing visual hallucinations.She is s/p J tube placement and followed with intractable nausea, vomiting. Surgery following, . ON 2/3 night pt developed fever of 101.4, became tachycardic and hypotensive. CXR, done , UA ordered and blood cultures negative so far. Her work up so far negative for infection. H&H remains stable at 8.5 , and she was found to have mild leukocytosis. Patient subsequently on 07/10/2013 was seen, she was found to be persistently hypotensive, tachycardic, febrile, despite 3 L of IV fluids her blood pressure stayed around 80 systolic, pulmonary critical care was consulted and patient was transferred to step down unit. Possible sources of infection include line infection vs. urological source, less likely aspiration pna. Per chart pts J-Tube not working. Question if pt could take oral meds. Upper Gi on 1/29 showed prominent gastro esophageal reflux with small stomach. BSE on 12/18 showed no evidence of aspiration, pt on Dys 1/thin liquids.  Type of Study: Bedside swallow evaluation Previous Swallow Assessment: BSE 12/18 - Dys 1/thin Diet Prior to this Study: NPO (Jtube) Temperature Spikes Noted: No Respiratory Status: Room air History  of Recent Intubation: No Behavior/Cognition: Alert;Cooperative;Confused Oral Cavity - Dentition: Missing dentition Self-Feeding Abilities: Total assist Patient Positioning: Upright in bed Baseline Vocal Quality: Clear;Low vocal intensity Volitional Cough: Strong Volitional Swallow: Unable to elicit    Oral/Motor/Sensory Function Overall Oral Motor/Sensory Function: Appears within functional limits for tasks assessed   Ice Chips Ice chips: Within functional limits   Thin Liquid Thin Liquid: Within functional limits Presentation: Cup;Straw    Nectar Thick Nectar Thick Liquid: Not tested   Honey Thick Honey Thick Liquid: Not tested   Puree Puree: Impaired Presentation: Spoon Oral Phase Impairments: Impaired anterior to posterior transit Oral Phase Functional Implications: Prolonged oral transit   Solid   GO    Solid: Not tested      Harlon DittyBonnie Keimon Basaldua, MA CCC-SLP [phone]  Erico Stan, Riley NearingBonnie Caroline 07/13/2013,10:56 AM

## 2013-07-14 LAB — CBC
HCT: 30.2 % — ABNORMAL LOW (ref 36.0–46.0)
Hemoglobin: 10.3 g/dL — ABNORMAL LOW (ref 12.0–15.0)
MCH: 31.1 pg (ref 26.0–34.0)
MCHC: 34.1 g/dL (ref 30.0–36.0)
MCV: 91.2 fL (ref 78.0–100.0)
PLATELETS: 144 10*3/uL — AB (ref 150–400)
RBC: 3.31 MIL/uL — AB (ref 3.87–5.11)
RDW: 17.9 % — AB (ref 11.5–15.5)
WBC: 4.7 10*3/uL (ref 4.0–10.5)

## 2013-07-14 LAB — COMPREHENSIVE METABOLIC PANEL
ALBUMIN: 1.8 g/dL — AB (ref 3.5–5.2)
ALT: 9 U/L (ref 0–35)
AST: 16 U/L (ref 0–37)
Alkaline Phosphatase: 49 U/L (ref 39–117)
BILIRUBIN TOTAL: 0.3 mg/dL (ref 0.3–1.2)
BUN: 3 mg/dL — AB (ref 6–23)
CHLORIDE: 112 meq/L (ref 96–112)
CO2: 26 mEq/L (ref 19–32)
Calcium: 7.9 mg/dL — ABNORMAL LOW (ref 8.4–10.5)
Creatinine, Ser: 0.59 mg/dL (ref 0.50–1.10)
GFR calc Af Amer: >90 mL/min (ref >90)
GFR calc non Af Amer: >90 mL/min (ref >90)
Glucose, Bld: 81 mg/dL (ref 70–99)
Potassium: 3.1 mEq/L — ABNORMAL LOW (ref 3.7–5.3)
Sodium: 146 mEq/L (ref 137–147)
Total Protein: 4 g/dL — ABNORMAL LOW (ref 6.0–8.3)

## 2013-07-14 LAB — GLUCOSE, CAPILLARY
GLUCOSE-CAPILLARY: 79 mg/dL (ref 70–99)
GLUCOSE-CAPILLARY: 78 mg/dL (ref 70–99)
Glucose-Capillary: 85 mg/dL (ref 70–99)
Glucose-Capillary: 85 mg/dL (ref 70–99)
Glucose-Capillary: 90 mg/dL (ref 70–99)

## 2013-07-14 LAB — MAGNESIUM: Magnesium: 2 mg/dL (ref 1.5–2.5)

## 2013-07-14 MED ORDER — POTASSIUM CHLORIDE 10 MEQ/100ML IV SOLN
10.0000 meq | INTRAVENOUS | Status: AC
  Administered 2013-07-14 (×4): 10 meq via INTRAVENOUS
  Filled 2013-07-14 (×4): qty 100

## 2013-07-14 MED ORDER — POTASSIUM CHLORIDE 20 MEQ/15ML (10%) PO LIQD
40.0000 meq | Freq: Once | ORAL | Status: AC
  Administered 2013-07-14: 40 meq via ORAL
  Filled 2013-07-14: qty 30

## 2013-07-14 MED ORDER — LOPERAMIDE HCL 1 MG/5ML PO LIQD
4.0000 mg | ORAL | Status: DC | PRN
  Administered 2013-07-14: 4 mg
  Filled 2013-07-14: qty 20

## 2013-07-14 MED ORDER — LOPERAMIDE HCL 2 MG PO CAPS: 2.0000 mg | ORAL_CAPSULE | Freq: Four times a day (QID) | ORAL | Status: DC | PRN

## 2013-07-14 MED ORDER — LEVETIRACETAM 500 MG PO TABS
1000.0000 mg | ORAL_TABLET | Freq: Two times a day (BID) | ORAL | Status: DC
Start: 2013-07-14 — End: 2013-07-16
  Administered 2013-07-15 – 2013-07-16 (×4): 1000 mg via ORAL
  Filled 2013-07-14 (×6): qty 2

## 2013-07-14 MED ORDER — VALPROIC ACID 250 MG/5ML PO SYRP
500.0000 mg | ORAL_SOLUTION | Freq: Three times a day (TID) | ORAL | Status: DC
  Administered 2013-07-14 – 2013-07-16 (×8): 500 mg via ORAL
  Filled 2013-07-14 (×10): qty 10

## 2013-07-14 MED ORDER — MIRTAZAPINE 15 MG PO TBDP
15.0000 mg | ORAL_TABLET | Freq: Every day | ORAL | Status: AC
  Administered 2013-07-15 – 2013-07-16 (×2): 15 mg via ORAL
  Filled 2013-07-14 (×2): qty 1

## 2013-07-14 NOTE — Progress Notes (Signed)
[facility], will return at discharge.   Maryclare LabradorJulie Johnthan Axtman, MSW, [facility] CenterCSWA Clinical Social Worker [phone]

## 2013-07-14 NOTE — Consult Note (Signed)
**Joan [name] De-Identified via Obfuscation** ANTIBIOTIC CONSULT Joan [name] - FOLLOW UP  Pharmacy Consult for Cipro Indication: Klebsiella bacteremia/UTI  Allergies  Allergen Reactions  . Morphine Joan Related Other (See Comments)    REACTION: Causes pain  . Paroxetine Hcl Other (See Comments)    REACTION:  unknown  . Penicillins Other (See Comments)    REACTION: Makes her body hurt.  . Sertraline Hcl Other (See Comments)    REACTION:  unknown    Joan Measurements: Height: 5\' 3"  (160 cm) Weight: 179 lb 14.3 oz (81.6 kg) IBW/kg (Calculated) : 52.4  Vital Signs: Temp: 98.5 F (36.9 C) (02/15 0800) Temp src: Oral (02/15 0800) BP: 111/79 mmHg (02/15 0800) Pulse Rate: 101 (02/15 0800) Intake/Output from previous day: 02/14 0701 - 02/15 0700 In: 3657.5 [P.O.:300; I.V.:1312.5; NG/GT:900; IV Piggyback:1145] Out: -  Intake/Output from this shift:    Labs:  Recent Labs  07/12/13 0500 07/13/13 0500 07/14/13 0415  WBC 4.8 5.0 4.7  HGB 11.4* 10.7* 10.3*  PLT 123* 153 144*  CREATININE 0.51 0.51 0.59   Estimated Creatinine Clearance: 84.2 ml/min (by C-G formula based on Cr of 0.59).  Microbiology: Recent Results (from the past 720 hour(s))  CLOSTRIDIUM DIFFICILE BY PCR     Status: None   Collection Time    06/19/13  5:14 PM      Result Value Ref Range Status   C difficile by pcr NEGATIVE  NEGATIVE Final  MRSA PCR SCREENING     Status: None   Collection Time    06/19/13  5:14 PM      Result Value Ref Range Status   MRSA by PCR NEGATIVE  NEGATIVE Final   Comment:            The GeneXpert MRSA Assay (FDA     approved for NASAL specimens     only), is one component of a     comprehensive MRSA colonization     surveillance program. It is not     intended to diagnose MRSA     infection nor to guide or     monitor treatment for     MRSA infections.  STOOL CULTURE     Status: None   Collection Time    06/19/13  5:14 PM      Result Value Ref Range Status   Specimen Description STOOL   Final   Special Requests NONE    Final   Culture     Final   Value: NO SALMONELLA, SHIGELLA, CAMPYLOBACTER, YERSINIA, OR E.COLI 0157:H7 ISOLATED     Performed at [facility]   Report Status 06/23/2013 FINAL   Final  CULTURE, BLOOD (ROUTINE X 2)     Status: None   Collection Time    06/23/13  2:02 PM      Result Value Ref Range Status   Specimen Description BLOOD LEFT ARM   Final   Special Requests BOTTLES DRAWN AEROBIC ONLY 10CC   Final   Culture  Setup Time     Final   Value: 06/23/2013 19:55     Performed at [facility]   Culture     Final   Value: NO GROWTH 5 DAYS     Performed at [facility]   Report Status 06/29/2013 FINAL   Final  CULTURE, BLOOD (ROUTINE X 2)     Status: None   Collection Time    06/23/13  2:14 PM      Result Value Ref Range Status   Specimen Description  BLOOD LEFT HAND   Final   Special Requests BOTTLES DRAWN AEROBIC ONLY 5.5 CC   Final   Culture  Setup Time     Final   Value: 06/23/2013 19:55     Performed at [facility]   Culture     Final   Value: NO GROWTH 5 DAYS     Performed at [facility]   Report Status 06/29/2013 FINAL   Final  CLOSTRIDIUM DIFFICILE BY PCR     Status: None   Collection Time    06/23/13  5:08 PM      Result Value Ref Range Status   C difficile by pcr NEGATIVE  NEGATIVE Final  URINE CULTURE     Status: None   Collection Time    06/23/13  6:15 PM      Result Value Ref Range Status   Specimen Description URINE, CLEAN CATCH   Final   Special Requests NONE   Final   Culture  Setup Time     Final   Value: 06/24/2013 01:22     Performed at [facility] Count     Final   Value: NO GROWTH     Performed at [facility]   Culture     Final   Value: NO GROWTH     Performed at [facility]   Report Status 06/24/2013 FINAL   Final  CULTURE, BLOOD (ROUTINE X 2)     Status: None   Collection Time    07/03/13  6:40 AM      Result Value Ref Range Status   Specimen Description BLOOD  LEFT HAND   Final   Special Requests BOTTLES DRAWN AEROBIC ONLY 2CC   Final   Culture  Setup Time     Final   Value: 07/03/2013 13:19     Performed at [facility]   Culture     Final   Value: NO GROWTH 5 DAYS     Performed at [facility]   Report Status 07/09/2013 FINAL   Final  CULTURE, BLOOD (ROUTINE X 2)     Status: None   Collection Time    07/03/13  6:55 AM      Result Value Ref Range Status   Specimen Description BLOOD LEFT ARM   Final   Special Requests BOTTLES DRAWN AEROBIC ONLY 1CC   Final   Culture  Setup Time     Final   Value: 07/03/2013 13:19     Performed at [facility]   Culture     Final   Value: NO GROWTH 5 DAYS     Performed at [facility]   Report Status 07/09/2013 FINAL   Final  CLOSTRIDIUM DIFFICILE BY PCR     Status: None   Collection Time    07/06/13 10:27 AM      Result Value Ref Range Status   C difficile by pcr NEGATIVE  NEGATIVE Final  URINE CULTURE     Status: None   Collection Time    07/10/13  8:15 AM      Result Value Ref Range Status   Specimen Description URINE, CATHETERIZED   Final   Special Requests NONE   Final   Culture  Setup Time     Final   Value: 07/10/2013 09:13     Performed at [facility] Count     Final   Value: 50,000 COLONIES/ML  Performed at [facility]   Culture     Final   Value: KLEBSIELLA PNEUMONIAE     YEAST     [facility]formed at [facility]   Report Status 07/13/2013 FINAL   Final   Organism ID, Bacteria KLEBSIELLA PNEUMONIAE   Final  CLOSTRIDIUM DIFFICILE BY PCR     Status: None   Collection Time    07/10/13  8:33 AM      Result Value Ref Range Status   C difficile by pcr NEGATIVE  NEGATIVE Final  CULTURE, BLOOD (ROUTINE X 2)     Status: None   Collection Time    07/10/13 10:55 AM      Result Value Ref Range Status   Specimen Description BLOOD LEFT ARM   Final   Special Requests BOTTLES DRAWN AEROBIC ONLY 8CC   Final   Culture  Setup Time      Final   Value: 07/10/2013 16:23     Performed [facility]   Culture     Final   Value: KLEBSIELLA PNEUMONIAE     Joan [name]: Gram Stain Report Called to,Read Back By Joan Verified With: HOLLY CHURCH 07/11/13 AT 0330 RIDK     [facility]formed at [facility]   Report Status 07/13/2013 FINAL   Final   Organism ID, Bacteria KLEBSIELLA PNEUMONIAE   Final  MRSA PCR SCREENING     Status: None   Collection Time    07/10/13 12:55 PM      Result Value Ref Range Status   MRSA by PCR NEGATIVE  NEGATIVE Final   Comment:            The GeneXpert MRSA Assay (FDA     approved for NASAL specimens     only), is one component of a     comprehensive MRSA colonization     surveillance program. It is not     intended to diagnose MRSA     infection nor to guide or     monitor treatment for     MRSA infections.  CULTURE, BLOOD (ROUTINE X 2)     Status: None   Collection Time    07/12/13 10:40 AM      Result Value Ref Range Status   Specimen Description BLOOD LEFT FOREARM   Final   Special Requests BOTTLES DRAWN AEROBIC Joan ANAEROBIC 10CC   Final   Culture  Setup Time     Final   Value: 07/12/2013 14:02     Performed [facility]   Culture     Final   Value:        BLOOD CULTURE RECEIVED NO GROWTH TO DATE CULTURE WILL BE HELD FOR 5 DAYS BEFORE ISSUING A FINAL NEGATIVE REPORT     Performed at [facility]   Report Status PENDING   Incomplete  CULTURE, BLOOD (ROUTINE X 2)     Status: None   Collection Time    07/12/13 10:50 AM      Result Value Ref Range Status   Specimen Description BLOOD LEFT ARM   Final   Special Requests BOTTLES DRAWN AEROBIC Joan ANAEROBIC 10CC   Final   Culture  Setup Time     Final   Value: 07/12/2013 14:02     [facility]formed [facility]   Culture     Final   Value:        BLOOD CULTURE RECEIVED NO GROWTH TO DATE CULTURE WILL BE  HELD FOR 5 DAYS BEFORE ISSUING A FINAL NEGATIVE REPORT     Performed at [facility]   Report Status  PENDING   Incomplete    Medical History: Past Medical History  Diagnosis Date  . Peripheral neuropathy   . Fibromyalgia   . Hypertension   . Anxiety   . Catatonia   . Seizures    Assessment: Joan Joan [name], Joan Joan [name], Joan she has tolerated Zosyn.  Blood Joan urine cultures Joan [name] Klebsiella pneumoniae (R to ampicillin). Pharmacy consulted to dose ciprofloxacin + aztreonam for bacteremia. Now de-escalated to ciprofloxacin only.  SCr 0.59 (CrCl ~84 ml/min), WBC wnl. Joan is afebrile.  1/25 zosyn>>1/28 2/11 azactam>>2/14 2/11 vanc>> 2/12 2/12 cipro >>  2/13 BCx2>>ngtd 2/11 cdiff PCR>> neg 2/11 ucx>> 50,000 colonies kleb pneumoniae 2/11 blood x2 >>Klebsiella pneumoniae (R to ampicillin)   Goal of Therapy:  Appropriate antibiotic dosing Eradication of infection  Plan:  - Continue ciprofloxacin 400 mg IV q 12 h - Follow renal function, cultures, clinical progression - LOT 14 days per IM (last dose on 2/24)  Margie Billet, PharmD Clinical Pharmacist - Resident Pager: [phone] Pharmacy: [phone] 07/14/2013 8:42 AM

## 2013-07-14 NOTE — Progress Notes (Signed)
12 Days Post-Op  Subjective: No changes.  Tolerating TF  Objective: Vital signs in last 24 hours: Temp:  [97 F (36.1 C)-98.6 F (37 C)] 98.5 F (36.9 C) (02/15 0800) Pulse Rate:  [88-127] 101 (02/15 0800) Resp:  [5-24] 18 (02/15 0800) BP: (106-147)/(61-114) 111/79 mmHg (02/15 0800) SpO2:  [93 %-100 %] 99 % (02/15 0800) Weight:  [81.6 kg (179 lb 14.3 oz)] 81.6 kg (179 lb 14.3 oz) (02/15 0500) Last BM Date: 07/13/13 (has rectal tube)  Intake/Output from previous day: 02/14 0701 - 02/15 0700 In: 3657.5 [P.O.:300; I.V.:1312.5; NG/GT:900; IV Piggyback:1145] Out: -  Intake/Output this shift:   PE GI: soft, non-tender; bowel sounds normal; no masses,  no organomegaly midline incision is healing well, steri-strips are intact.  J tube in place TF at goal.    Lab Results:   Recent Labs  07/13/13 0500 07/14/13 0415  WBC 5.0 4.7  HGB 10.7* 10.3*  HCT 31.5* 30.2*  PLT 153 144*   BMET  Recent Labs  07/13/13 0500 07/14/13 0415  NA 144 146  K 3.2* 3.1*  CL 111 112  CO2 24 26  GLUCOSE 89 81  BUN 4* 3*  CREATININE 0.51 0.59  CALCIUM 7.7* 7.9*   PT/INR No results found for this basename: LABPROT, INR,  in the last 72 hours ABG No results found for this basename: PHART, PCO2, PO2, HCO3,  in the last 72 hours  Studies/Results: Ir Replc Duoden/jejuno Tube Percut W/fluoro  07/13/2013   CLINICAL DATA:  Occluded surgical jejunostomy requiring exchange.  EXAM: JEJUNOSTOMY TUBE EXCHANGE  CONTRAST:  10 ml Omnipaque-300  FLUOROSCOPY TIME:  1 min and 24 seconds.  PROCEDURE: Informed consent was obtained by telephone from the patient's son.  The pre-existing jejunostomy and surrounding skin were prepped with Betadine. Attempt was made to inject contrast via the pre-existing tube. The tube was cut and removed over a hydrophilic guidewire. A new 12 French red rubber jejunostomy catheter was advanced over the wire.  Final catheter position was confirmed with a fluoroscopic spot image  obtained after injection of contrast. The catheter was secured at the exit site with a silk retention suture.  COMPLICATIONS: None.  FINDINGS: The pre-existing 64 French tube is occluded. A new 12 French tube was placed and advanced into the jejunum.  IMPRESSION: Replacement of occluded 10 French jejunostomy catheter. The tube was up sized to a 12 [place] catheter advanced into the jejunum over a guidewire.   Electronically Signed   By: Irish Lack M.D.   On: 07/13/2013 15:54    Anti-infectives: Anti-infectives   Start     Dose/Rate Route Frequency Ordered Stop   07/11/13 1230  ciprofloxacin (CIPRO) IVPB 400 mg     400 mg 200 mL/hr over 60 Minutes Intravenous Every 12 hours 07/11/13 1151     07/10/13 2100  vancomycin (VANCOCIN) IVPB 750 mg/150 ml premix  Status:  Discontinued     750 mg 150 mL/hr over 60 Minutes Intravenous Every 8 hours 07/10/13 1215 07/11/13 1103   07/10/13 1400  aztreonam (AZACTAM) 1 g in dextrose 5 % 50 mL IVPB  Status:  Discontinued     1 g 100 mL/hr over 30 Minutes Intravenous 3 times per day 07/10/13 1215 07/13/13 1111   07/10/13 1230  vancomycin (VANCOCIN) IVPB 1000 mg/200 mL premix     1,000 mg 200 mL/hr over 60 Minutes Intravenous  Once 07/10/13 1215 07/10/13 1422   07/02/13 1330  ciprofloxacin (CIPRO) IVPB 400 mg  400 mg 200 mL/hr over 60 Minutes Intravenous To Surgery 07/02/13 1321 07/02/13 1326   06/23/13 1400  piperacillin-tazobactam (ZOSYN) IVPB 3.375 g  Status:  Discontinued     3.375 g 12.5 mL/hr over 240 Minutes Intravenous 3 times per day 06/23/13 1336 06/26/13 1441   06/20/13 1200  vancomycin (VANCOCIN) IVPB 1000 mg/200 mL premix     1,000 mg 200 mL/hr over 60 Minutes Intravenous  Once 06/20/13 1146 06/20/13 1433      Assessment/Plan: s/p Procedure(s): EXPLORATORY LAPAROTOMY with revision of jejunostomy feeding tube. (N/A) SMALL BOWEL RESECTION (N/A) S/p J tube revision Ir successfully exchanged the J tube She is tolerating tube feeds    LOS: 25 days    Joan [name] ANP-BC 07/14/2013 8:52 AM

## 2013-07-14 NOTE — Progress Notes (Signed)
I have seen and examined the pt and agree with PA-Riebock's progress note. TFs being tolerated well [facility]

## 2013-07-14 NOTE — Progress Notes (Signed)
Patient Demographics  Joan [name], is a 53 y.o. female, DOB - 1960/09/05, PNT:[id number]  Admit date - 06/19/2013   Admitting Physician Charlynne Cousins, MD  Outpatient Primary MD for the patient is [name],[name], MD  LOS - 25   No chief complaint on file.       Assessment & Plan    Assumed care of the patient on 07/10/2013 on day 21 of her hospital stay   HPI/Subjective:   53 yr old female presents with a CC of Altered mental status. Pt has a PMH of peripheral neuropathy, seizures, catatonia, anxiety, HTN, and fibromyalgia. Pt was previously admitted on 12/13 thru 12/28 with toxic metabolic encephalopathy. 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 EEG showed no recurrent seizures. A 24 hour EEG showed an intermittent delta pattern felt to represent Frontal Intermittent Rhythmic Delta Activity. Pt was started on two antiepileptics.     This admission, she was again found altered and seemed "comatose" . Since d/c in December, pt has had persistent confusion and is confined to a wheelchair at [facility]. She is currently confused and experiencing visual hallucinations.She is s/p J tube placement and followed with intractable nausea, vomiting. Surgery following, . ON 2/3 night pt developed fever of 101.4, became tachycardic and hypotensive. CXR, done , UA ordered and blood cultures negative so far. Her work up so far negative for infection. H&H remains stable at 8.5 , and she was found to have mild leukocytosis.     Patient subsequently on 07/10/2013 was seen, she was found to be persistently hypotensive, tachycardic, febrile, despite 3 L of IV fluids her blood pressure stayed around 80 systolic, pulmonary critical care was consulted and  patient was transferred to step down unit. Possible sources of infection aspiration with hospital-acquired pneumonia, intra-abdominal sepsis due to recent J-tube reversal, dialysis catheter which has been placed about 2 weeks ago.       Assessment/Plan:    Sepsis on 07/10/2013 - she was found to be persistently hypotensive, tachycardic, febrile, was transferred to step down unit to 07/10/2013, critical care team was consulted, Gram bacteria on 1/2 B cultures, this likely UTI versus dialysis catheter related infection (although only 50,000 colonies growing in the urine the species and sensitivity are identical so infection more likely UTI)  also had a recent intra-abdominal sepsis due to recent J-tube reversal, and had a dialysis catheter which has been placed about 2 weeks ago. Change foley 07-11-13.  Been placed on appropriate broad-spectrum antibiotics with improvement, noted cultures and has left her on IV Cipro only at this time, dialysis catheter removed on 07/11/2013, blood pressure better with IV fluids, lactic acid improvement in critical care monitoring the patient also. General surgery on board for J-tube reversal surgery. Abdominal x-rays appear stable.  Will require total 14 days of antibiotics for gram-negative bacteremia. Antibiotics start date 07/10/2013     Anemia   Unclear etiology, s/p 2 unit s of prbc transfusion and started her on protonix . Her stool for occult blood negative in the last 2 weeks. Anemia panel showed severe iron deficiency.  We'll replace IV IRON ON 2/15/ 2015 AS NOW SEPSIS IN GOOD CONTROL.      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 Pt received diatech catheter on 06/20/2013 placement and received and finished plasmapheresis returns, case discussed with neurologist Dr. Doy Mince on 07/11/2013.  -MRI of the brain showed no  acute abnormality.-Ammonia level is normal . [facility] paraneoplastic panel reported to be negative, MRI of the C-spine is negative for acute findings   - We'll continue to monitor with supportive care, so far minimal improvement.  - started PT-OT       Tachycardia  - Has been problem for the last several weeks, despite clinical improvement in sepsis she remains in sinus tachycardia, volume status appears appropriate, does not appear to be in any pain or discomfort, stable TSH and echogram, CT angina negative for PE. -Place on on po metoprolol IV while n.p.o.      Hx of Convulsions/Seizures  -Pt has a PMH of seizures and catatonia.  -MRI of the brain showed thalamic signal abnormality during previous admission. No longer present on current MRI.  -Pt currently taking Keppra and Depakene. Increased Depakene to 581m tid      Hypokalemia and hypocalcemia  - Repleted and monitor     Nausea&Vomiting recent J-tube surgery also initially had some diarrhea   C. difficile negative and diarrhea has improved, supportive care for that. Abdominal x-rays do not show any evidence of obstruction, J-tube clog cleared by IR on 07/13/2013 Tube feeding resumed on 07/13/2013 via J-tube including free water flushes -Speech following only pills by mouth      Hx of Anxiety  -Continue pt on Remeron  -Patient appears to have had a psychiatric history  -Current illness is bringing out psychiatric symptoms.  -Psychiatry consulted as psychotropic medications may help with her current symptoms.  -Psych prescribed Risperdal .5 mg bid  Ativan prn for anxiety.       Hypertension  -Pt has a PMH of HTN  -Likely secondary to anxiety  Continue metoprolol and prn hydralazine.       Malnutrition with history of Gastric Bypass  -Per SNF patient has had no PO intake for over 1 week prior to admission.  -SNF sent her to hospital for J tube placement.  -Gen Surg consulted, J-tube placed and  underwent revision of j tube with exploratory laparotomy on 2/3 and small bowel resection.      Recent hx of c-diff (05/18/13)  -Pt negative for C-diff at this admit      Thrombocytopenia  -No acute bleeding monitor     Enterovaginal fistula  -Reported prior to that this patient had some stools in her vagina.  -Discussed with radiology, no evidence of rectovaginal fistula and the previous imaging, (patient had indicated pelvic scan done previously).  - Consulted GI, and the did not feel that patient will benefit from colonoscopy for the diagnosis of fistula.  - Called and discussed with GBallyon-call, and they recommend to follow the patient as outpatient. No further workup required in the hospital.       Code Status: full   Family Communication: Updated son in detail over the phone on 07/13/2013   Disposition Plan: SNF    SIGNIFICANT EVENTS / STUDIES:  1/21- admitted  1/23 - CT Chest . No evidence of malignancy or other significant abnormality of the chest  1/24 - MRI C-spine > No evidence of cervical disc herniation. Minimal degenerative changes upper thoracic spine  1/27 - J tube  placed  2/3 - Small bowel resection and J-tube revision.  2/11 - Abd xray > no bowel obstruction, possible mild ileus  07/05/2013 echocardiogram stable EF of 60% 11/16/2013 Lower extremity venous duplex no DVT   LINES / TUBES:  R Permacath 1/22 >>> removed on 07/10/2013 R PICC 1/20 >>> Initial placement Jtube 1/27 >>> expiratory lap with the revision of J-tube on 07/02/2013 by general surgery Flexiseal  J-tube clogg removed by IR on 07/13/2013   CULTURES:  C-Diff 1/21 > Neg  Stool 1/21 > Neg  C-diff 1/25 > Neg  Urine 1/25 > Neg  ______________  ______________  Blood 2/4 > Neg  C-Diff 2/7 > Neg  Blood 2/11 >>>ng  Urine 2/11 >>>  C-diff 2/11 >>>neg  Blood 1/25 > Neg Blood 2/11 > gram -ve rods    Consults  Gen. surgery, neurology, PC CM, GYN   Medications  Scheduled  Meds: . chlorhexidine  15 mL Mouth Rinse BID  . ciprofloxacin  400 mg Intravenous Q12H  . cloNIDine  0.1 mg Transdermal Weekly  . enoxaparin (LOVENOX) injection  40 mg Subcutaneous Q24H  . ferric gluconate (FERRLECIT/NULECIT) IV  125 mg Intravenous Daily  . free water  140 mL Per Tube QID  . levETIRAcetam  1,000 mg Oral BID  . metoprolol tartrate  100 mg Oral BID  . mirtazapine  15 mg Oral QHS  . pantoprazole sodium  40 mg Per Tube Q1200  . potassium chloride  10 mEq Intravenous Q1 Hr x 4  . risperiDONE  0.5 mg Oral BID  . sodium chloride  10 mL Intravenous Q12H  . Valproic Acid  500 mg Oral TID   Continuous Infusions: . citrate dextrose    . dextrose 20 mL/hr at 07/13/13 2228  . feeding supplement (VITAL AF 1.2 CAL) 1,000 mL (07/14/13 0600)   PRN Meds:.acetaminophen, acetaminophen, HYDROmorphone (DILAUDID) injection, loperamide, metoprolol, ondansetron, promethazine, sodium chloride, sodium chloride  DVT Prophylaxis Lovenox   Lab Results  Component Value Date   PLT 144* 07/14/2013    Antibiotics    Anti-infectives   Start     Dose/Rate Route Frequency Ordered Stop   07/11/13 1230  ciprofloxacin (CIPRO) IVPB 400 mg     400 mg 200 mL/hr over 60 Minutes Intravenous Every 12 hours 07/11/13 1151     07/10/13 2100  vancomycin (VANCOCIN) IVPB 750 mg/150 ml premix  Status:  Discontinued     750 mg 150 mL/hr over 60 Minutes Intravenous Every 8 hours 07/10/13 1215 07/11/13 1103   07/10/13 1400  aztreonam (AZACTAM) 1 g in dextrose 5 % 50 mL IVPB  Status:  Discontinued     1 g 100 mL/hr over 30 Minutes Intravenous 3 times per day 07/10/13 1215 07/13/13 1111   07/10/13 1230  vancomycin (VANCOCIN) IVPB 1000 mg/200 mL premix     1,000 mg 200 mL/hr over 60 Minutes Intravenous  Once 07/10/13 1215 07/10/13 1422   07/02/13 1330  ciprofloxacin (CIPRO) IVPB 400 mg     400 mg 200 mL/hr over 60 Minutes Intravenous To Surgery 07/02/13 1321 07/02/13 1326   06/23/13 1400   piperacillin-tazobactam (ZOSYN) IVPB 3.375 g  Status:  Discontinued     3.375 g 12.5 mL/hr over 240 Minutes Intravenous 3 times per day 06/23/13 1336 06/26/13 1441   06/20/13 1200  vancomycin (VANCOCIN) IVPB 1000 mg/200 mL premix     1,000 mg 200 mL/hr over 60 Minutes Intravenous  Once 06/20/13 1146 06/20/13 1433  Subjective:   Terryl Niziolek today is in hospital bed, does not appear to be in any discomfort however appears febrile, unable to answer questions  Objective:   Filed Vitals:   07/14/13 0400 07/14/13 0500 07/14/13 0600 07/14/13 0800  BP: 108/65 112/61 114/82 111/79  Pulse: 90 88 97 101  Temp: 97.1 F (36.2 C)   98.5 F (36.9 C)  TempSrc: Oral   Oral  Resp: _0 Height:      Weight:  81.6 kg (179 lb 14.3 oz)    SpO2: 99% 99% 100% 99%    Wt Readings from Last 3 Encounters:  07/14/13 81.6 kg (179 lb 14.3 oz)  07/14/13 81.6 kg (179 lb 14.3 oz)  07/14/13 81.6 kg (179 lb 14.3 oz)     Intake/Output Summary (Last 24 hours) at 07/14/13 0909 Last data filed at 07/14/13 0700  Gross per 24 hour  Intake 3507.5 ml  Output      0 ml  Net 3507.5 ml     Physical Exam  Awake, not alert, No new F.N deficits, Normal affect [place].AT,PERRAL Supple Neck,No JVD, No cervical lymphadenopathy appriciated.  Symmetrical Chest wall movement, Good air movement bilaterally, CTAB RRR,No Gallops,Rubs or new Murmurs, No Parasternal Heave +ve B.Sounds, Abd Soft has PEG tube in place, recent surgery scar stable with staples, Non tender, No organomegaly appriciated, No rebound - guarding or rigidity. No Cyanosis, Clubbing or edema, No new Rash or bruise      Data Review   Micro Results Recent Results (from the past 240 hour(s))  CLOSTRIDIUM DIFFICILE BY PCR     Status: None   Collection Time    07/06/13 10:27 AM      Result Value Ref Range Status   C difficile by pcr NEGATIVE  NEGATIVE Final  URINE CULTURE     Status: None   Collection Time    07/10/13  8:15 AM       Result Value Ref Range Status   Specimen Description URINE, CATHETERIZED   Final   Special Requests NONE   Final   Culture  Setup Time     Final   Value: 07/10/2013 09:13     Performed at [place]     Final   Value: 50,000 COLONIES/ML     Performed at [facility]   Culture     Final   Value: KLEBSIELLA PNEUMONIAE     YEAST     Performed at [facility]   Report Status 07/13/2013 FINAL   Final   Organism ID, Bacteria KLEBSIELLA PNEUMONIAE   Final  CLOSTRIDIUM DIFFICILE BY PCR     Status: None   Collection Time    07/10/13  8:33 AM      Result Value Ref Range Status   C difficile by pcr NEGATIVE  NEGATIVE Final  CULTURE, BLOOD (ROUTINE X 2)     Status: None   Collection Time    07/10/13 10:55 AM      Result Value Ref Range Status   Specimen Description BLOOD LEFT ARM   Final   Special Requests BOTTLES DRAWN AEROBIC ONLY 8CC   Final   Culture  Setup Time     Final   Value: 07/10/2013 16:23     Performed at [facility]   Culture     Final   Value: KLEBSIELLA PNEUMONIAE     Note: Gram Stain Report Called to,Read Back By and Verified  With: [facility] 07/11/13 AT 0330 RIDK     Performed at [facility]   Report Status 07/13/2013 FINAL   Final   Organism ID, Bacteria KLEBSIELLA PNEUMONIAE   Final  MRSA PCR SCREENING     Status: None   Collection Time    07/10/13 12:55 PM      Result Value Ref Range Status   MRSA by PCR NEGATIVE  NEGATIVE Final   Comment:            The GeneXpert MRSA Assay (FDA     approved for NASAL specimens     only), is one component of a     comprehensive MRSA colonization     surveillance program. It is not     intended to diagnose MRSA     infection nor to guide or     monitor treatment for     MRSA infections.  CULTURE, BLOOD (ROUTINE X 2)     Status: None   Collection Time    07/12/13 10:40 AM      Result Value Ref Range Status   Specimen Description BLOOD LEFT FOREARM   Final    Special Requests BOTTLES DRAWN AEROBIC AND ANAEROBIC 10CC   Final   Culture  Setup Time     Final   Value: 07/12/2013 14:02     Performed at [facility]   Culture     Final   Value:        BLOOD CULTURE RECEIVED NO GROWTH TO DATE CULTURE WILL BE HELD FOR 5 DAYS BEFORE ISSUING A FINAL NEGATIVE REPORT     Performed at [facility]   Report Status PENDING   Incomplete  CULTURE, BLOOD (ROUTINE X 2)     Status: None   Collection Time    07/12/13 10:50 AM      Result Value Ref Range Status   Specimen Description BLOOD LEFT ARM   Final   Special Requests BOTTLES DRAWN AEROBIC AND ANAEROBIC 10CC   Final   Culture  Setup Time     Final   Value: 07/12/2013 14:02     Performed at [facility]   Culture     Final   Value:        BLOOD CULTURE RECEIVED NO GROWTH TO DATE CULTURE WILL BE HELD FOR 5 DAYS BEFORE ISSUING A FINAL NEGATIVE REPORT     Performed at [facility]   Report Status PENDING   Incomplete    Radiology Reports Dg Abd 1 View  06/27/2013   CLINICAL DATA:  Nausea, abdominal pain, vomiting  EXAM: ABDOMEN - 1 VIEW  COMPARISON:  None.  FINDINGS: There is a surgical drain present in the mid abdomen. There are surgical stated in the right paramedian abdominal wall. There is a small amount of contrast scattered throughout the colon. There is no bowel dilatation to suggest obstruction. There is no evidence of pneumoperitoneum, portal venous gas or pneumatosis. There are no pathologic calcifications along the expected course of the ureters.The osseous structures are unremarkable.  IMPRESSION: Nonobstructive bowel gas pattern.   Electronically Signed   By: Kathreen Devoid   On: 06/27/2013 09:34      Ct Head Wo Contrast  06/13/2013   CLINICAL DATA:  Altered mental status. Failure to thrive. Seizures.  EXAM: CT HEAD WITHOUT CONTRAST  TECHNIQUE: Contiguous axial images were obtained from the base of the skull through the vertex without intravenous contrast.   COMPARISON:  05/13/2013  FINDINGS: There is no evidence of intracranial hemorrhage, brain edema, or other signs of acute infarction. There is no evidence of intracranial mass lesion or mass effect. No abnormal extraaxial fluid collections are identified.  Mild diffuse cerebral atrophy is stable. No evidence hydrocephalus. No other intracranial abnormality identified. No skull fracture or other bone lesion identified.  IMPRESSION: No acute intracranial findings.  Stable mild cerebral atrophy.   Electronically Signed   By: Earle Gell M.D.   On: 06/13/2013 19:53     Ct Chest W Contrast  06/21/2013   CLINICAL DATA:  Altered mental status.  Evaluate for malignancy.  EXAM: CT CHEST WITH CONTRAST  TECHNIQUE: Multidetector CT imaging of the chest was performed during intravenous contrast administration.  CONTRAST:  53m OMNIPAQUE IOHEXOL 300 MG/ML  SOLN  COMPARISON:  Chest x-ray dated 05/11/2013  FINDINGS: Heart size and pulmonary vascularity are normal. There is no hilar or mediastinal adenopathy. The lungs are clear. No mass lesions, infiltrates, or effusions. Soft tissues of the chest are normal. Thyroid gland is normal. No acute osseous abnormality. Osteophytes fuse the thoracic spine from T6 through T10.  IMPRESSION: No evidence of malignancy or other significant abnormality of the chest.   Electronically Signed   By: JRozetta NunneryM.D.   On: 06/21/2013 17:13     Mr BJeri CosWJDContrast  06/20/2013   CLINICAL DATA:  Altered mental status. Possible autoimmune encephalitis.  EXAM: MRI HEAD WITHOUT AND WITH CONTRAST  TECHNIQUE: Multiplanar, multiecho pulse sequences of the brain and surrounding structures were obtained without and with intravenous contrast.  CONTRAST:  143mMULTIHANCE GADOBENATE DIMEGLUMINE 529 MG/ML IV SOLN  COMPARISON:  Head CT 06/13/2013 and brain MRI 05/16/2013  FINDINGS: Images are mildly to moderately degraded by motion artifact.  Incidental note is again made of a partially empty sella. No  definite residual abnormal T2 or diffusion weighted signal is identified in the thalami. Mild periventricular T2 hyperintensity is unchanged and nonspecific. No new areas of brain parenchymal signal abnormality are identified. There is mild cerebral atrophy. There is no evidence of acute infarct. There is no evidence of mass, midline shift, or extra-axial fluid collection. Orbits are unremarkable. Major intracranial vascular flow voids are unremarkable. Visualized paranasal sinuses and mastoid air cells are clear. There is no abnormal enhancement.  IMPRESSION: Interval resolution of thalamic signal abnormality. No evidence of new intracranial abnormality.   Electronically Signed   By: AlLogan Bores On: 06/20/2013 17:26    Mr Cervical Spine Wo Contrast  06/22/2013   CLINICAL DATA:  Right hemiparesis. Possible autoimmune encephalitis.  EXAM: MRI CERVICAL SPINE WITHOUT CONTRAST  TECHNIQUE: Multiplanar, multisequence MR imaging was performed. No intravenous contrast was administered.  COMPARISON:  No comparison cervical spine MR. brain MR 06/20/2013.  FINDINGS: On this motion grade examination, artifact extends through the cervical cord however, no definitive cervical cord signal abnormality is noted. Cervical medullary junction unremarkable. Intracranial structures as detailed on recent MR of the brain.  Visualized paravertebral structures unremarkable. Both vertebral arteries are patent.  C2-3:  Negative.  C3-4:  Minimal right foraminal narrowing.  C4-5:  Minimal right foraminal narrowing.  C5-6:  Negative.  C6-7:  Negative.  C7-T1:  Negative.  T1-2:  Negative.  T2-3: Minimal Schmorl's node deformity.  Minimal bulge.  T3-4: Minimal bulge.  T4-5: Minimal bulge.  IMPRESSION: No evidence of cervical disc herniation.  Minimal degenerative changes upper thoracic spine.  Please see above.   Electronically Signed   By: StRichardson Landry  Jeannine Kitten M.D.   On: 06/22/2013 19:38    US Transvaginal Non-ob  06/21/2013   CLINICAL DATA:   Altered mental status. Evaluation for possible paraneoplastic syndrome.  EXAM: TRANSABDOMINAL AND TRANSVAGINAL ULTRASOUND OF PELVIS  TECHNIQUE: Both transabdominal and transvaginal ultrasound examinations of the pelvis were performed. Transabdominal technique was performed for global imaging of the pelvis including uterus, ovaries, adnexal regions, and pelvic cul-de-sac. It was necessary to proceed with endovaginal exam following the transabdominal exam to visualize the ovaries.  COMPARISON:  None  FINDINGS: Uterus  Removed.  Right ovary  Not visualized.  Left ovary  Not visualized.  Other findings  No free fluid.  IMPRESSION: The ovaries could not be visualized on transvaginal or transabdominal pelvic ultrasound. However, review of the CT scan of the pelvis dated 05/17/2013 does demonstrate that both ovaries appear normal, measuring 23 mm on the right and 16 mm on the left.   Electronically Signed   By: Rozetta Nunnery M.D.   On: 06/21/2013 16:45   US Pelvis Complete  06/21/2013   CLINICAL DATA:  Altered mental status. Evaluation for possible paraneoplastic syndrome.  EXAM: TRANSABDOMINAL AND TRANSVAGINAL ULTRASOUND OF PELVIS  TECHNIQUE: Both transabdominal and transvaginal ultrasound examinations of the pelvis were performed. Transabdominal technique was performed for global imaging of the pelvis including uterus, ovaries, adnexal regions, and pelvic cul-de-sac. It was necessary to proceed with endovaginal exam following the transabdominal exam to visualize the ovaries.  COMPARISON:  None  FINDINGS: Uterus  Removed.  Right ovary  Not visualized.  Left ovary  Not visualized.  Other findings  No free fluid.  IMPRESSION: The ovaries could not be visualized on transvaginal or transabdominal pelvic ultrasound. However, review of the CT scan of the pelvis dated 05/17/2013 does demonstrate that both ovaries appear normal, measuring 23 mm on the right and 16 mm on the left.   Electronically Signed   By: Rozetta Nunnery M.D.    On: 06/21/2013 16:45   Ir Fluoro Guide Cv Line Right  06/20/2013   CLINICAL DATA:  Encephalitis and need for tunneled catheter for plasmapheresis.  EXAM: TUNNELED CENTRAL VENOUS CATHETER PLACEMENT WITH ULTRASOUND AND FLUOROSCOPIC GUIDANCE  MEDICATIONS: 1 g IV vancomycin. Vancomycin was given within two hours of incision. Vancomycin was given due to an antibiotic allergy.  ANESTHESIA/SEDATION: 2.0 mg IV Versed; 100 mcg IV Fentanyl.  Total Moderate Sedation Time  Twenty minutes.  FLUOROSCOPY TIME:  42 seconds.  PROCEDURE: The procedure, risks, benefits, and alternatives were explained to the patient. Questions regarding the procedure were encouraged and answered. The patient understands and consents to the procedure.  The right neck and chest were prepped with chlorhexidine in a sterile fashion, and a sterile drape was applied covering the operative field. Maximum barrier sterile technique with sterile gowns and gloves were used for the procedure. Local anesthesia was provided with 1% lidocaine.  Ultrasound was used to confirm patency of the right internal jugular vein. After creating a small venotomy incision, a 21 gauge needle was advanced into the right internal jugular vein under direct, real-time ultrasound guidance. Ultrasound image documentation was performed. After securing guidewire access, an 8 Fr dilator was placed. A J-wire was kinked to measure appropriate catheter length.  A Bard Equistream tunneled hemodialysis/pheresis catheter measuring 19 cm from tip to cuff was chosen for placement. This was tunneled in a retrograde fashion from the chest wall to the venotomy incision.  At the venotomy, serial dilatation was performed and a 16 Fr peel-away sheath was placed  over a guidewire. The catheter was then placed through the sheath and the sheath removed. Final catheter positioning was confirmed and documented with a fluoroscopic spot image. The catheter was aspirated, flushed with saline, and injected  with appropriate volume heparin dwells.  The venotomy incision was closed with subcutaneous 3-0 Monocryl and subcuticular 4-0 Vicryl. Dermabond was applied to the incision. The catheter exit site was secured with 0-Prolene retention sutures.  COMPLICATIONS: None.  No pneumothorax.  FINDINGS: After catheter placement, the tips lie in the right atrium. The catheter aspirates normally and is ready for immediate use.  IMPRESSION: Placement of tunneled pheresis catheter via the right internal jugular vein. The catheter tips lie in the right atrium. The catheter is ready for immediate use.   Electronically Signed   By: Aletta Edouard M.D.   On: 06/20/2013 14:27   Ir Fluoro Guide Cv Line Right  06/18/2013   EXAM: ULTRASOUND AND FLUOROSCOPIC GUIDED PICC LINE INSERTION  MEDICATIONS: None.  TECHNIQUE: The procedure, risks, benefits, and alternatives were explained to the patient's son and informed written consent was obtained. A timeout was performed prior to the initiation of the procedure.  The right upper extremity was prepped with chlorhexidine in a sterile fashion, and a sterile drape was applied covering the operative field. Maximum barrier sterile technique with sterile gowns and gloves were used for the procedure. A timeout was performed prior to the initiation of the procedure. Local anesthesia was provided with 1% lidocaine.  Under direct ultrasound guidance, the right brachial vein was accessed with a micropuncture kit after the overlying soft tissues were anesthetized with 1% lidocaine. An ultrasound image was saved for documentation purposes. A guidewire was advanced to the level of the superior caval-atrial junction for measurement purposes and the PICC line was cut to length. A peel-away sheath was placed and a 36 cm, 5 [place], dual lumen was inserted to level of the superior caval-atrial junction. A post procedure spot fluoroscopic was obtained. The catheter easily aspirated and flushed and was sutured in  place. A dressing was placed. The patient tolerated the procedure well without immediate post procedural complication.  CONTRAST:  None  FLUOROSCOPY TIME:  36 seconds.  COMPLICATIONS: None immediate  FINDINGS: After catheter placement, the tip lies within the superior cavoatrial junction. The catheter aspirates and flushes normally and is ready for immediate use.  IMPRESSION: Successful ultrasound and fluoroscopic guided placement of a right brachial vein approach, 36 cm, 5 French, dual lumen PICC with tip at the superior caval-atrial junction. The PICC line is ready for immediate use.  INDICATION: Poor venous access, in need of intravenous access for blood draws and medication administration   Electronically Signed   By: Sandi Mariscal M.D.   On: 06/18/2013 15:51      Dg Chest Port 1 View  07/10/2013   CLINICAL DATA:  Shortness of breath.  Hypotension  EXAM: PORTABLE CHEST - 1 VIEW  COMPARISON:  DG CHEST 1V PORT dated 07/03/2013; CT CHEST W/CM dated 06/21/2013  FINDINGS: Right internal jugular dialysis catheter tip: Right atrium. Thoracic spondylosis noted. There is a band of airspace opacity in the right upper lobe just above the minor fissure. Improved aeration at the right lung base.  IMPRESSION: Improved aeration at the right lung base, with but there is a band of airspace opacity in the right upper lobe favoring atelectasis over pneumonia. Next item thoracic spondylosis.   Electronically Signed   By: Sherryl Barters M.D.   On: 07/10/2013 08:27  Dg Chest Port 1 View  07/03/2013   CLINICAL DATA:  Tubes and lines in good position, detailed above.  EXAM: PORTABLE CHEST - 1 VIEW  COMPARISON:  Chest CT 06/21/2013  FINDINGS: There is nasogastric tube which terminates near the pylorus. Right IJ catheter, tip in the upper right atrium. Right upper extremity PICC, tip at the upper cavoatrial junction.  Oral contrast noted within the gastric fundus and small bowel.  Haziness of the right base, with volume loss.  Node definitive consolidation.  No cardiomegaly.  IMPRESSION: 1. Negative for pneumonia. 2. Haziness of the right lower chest, likely pleural fluid or atelectasis. 3. Tubes and lines in good position, detailed above.   Electronically Signed   By: Jorje Guild M.D.   On: 07/03/2013 03:35     Dg Abd Portable 1v  07/10/2013   CLINICAL DATA:  Assess for ileus  EXAM: PORTABLE ABDOMEN - 1 VIEW  COMPARISON:  June 30, 2013  FINDINGS: There is persistent air-filled dilated small bowel loops in the left abdomen. Air is noted in the colon. Skin staples are projected over the right abdomen. Patient is status post prior cholecystectomy.  IMPRESSION: Persistent ileus.   Electronically Signed   By: Abelardo Diesel M.D.   On: 07/10/2013 02:30     Dg Abd Portable 1v  06/30/2013   CLINICAL DATA:  NG tube placement.  EXAM: PORTABLE ABDOMEN - 1 VIEW  COMPARISON:  Single view of the abdomen 06/27/2013. Upper GI/small bowel follow-through 06/28/2013  FINDINGS: NG tube is in place in good position with the tip in this distal stomach. Contrast material in small bowel from the comparison upper GI series/small bowel follow-through. Small bowel loops are dilated. Only a small amount of loculated barium is seen in the colon.  IMPRESSION: NG tube in good position.  Small bowel obstruction.   Electronically Signed   By: Inge Rise M.D.   On: 06/30/2013 19:01     Dg Addison Bailey G Tube Plc W/fl-no Rad  06/17/2013   CLINICAL DATA: patient to have peg tube placement on January 20th   NASO G TUBE PLACEMENT WITH FLUORO  Fluoroscopy was utilized by the requesting physician.  No radiographic  interpretation.     Dg Ugi W/small Bowel  06/28/2013   CLINICAL DATA:  53 year old female postop 2 days since percutaneous jejunostomy tube placement. Drainage from NG tube. Abdominal pain. Initial encounter.  EXAM: UPPER GI SERIES WITH SMALL BOWEL FOLLOW-THROUGH  FLUOROSCOPIC GUIDED NG TUBE PLACEMENT  TECHNIQUE: Combined double contrast and  single contrast upper GI series using effervescent crystals, thick barium, and thin barium. Subsequently, serial images of the small bowel were obtained including spot views of the terminal ileum.  COMPARISON:  KUB 06/27/2013 and earlier.  FLUOROSCOPY TIME:  9 min and 18 seconds.  FINDINGS: Preprocedural scout view of the abdomen demonstrates a small volume of loculated barium in the colon. This is related to the 06/17/2013 NG tube placement/ confirmation study.  Scout view demonstrates the recently placed right an mid lower abdomen percutaneous jejunostomy. Right lower abdomen skin staples in place.  The scout view also demonstrates the patient's NG tube is looped back up into the esophagus. This was evaluated in real-time with fluoroscopy, and a wire was placed through the tube in an effort to maneuver the tip back into the stomach. However, this was unsuccessful an the tube had be retracted into the nasopharynx. Using fluoroscopic guidance, the tube was then advanced with a wire into the stomach, such that  the tip and side hole are within the stomach.  Given the recent abdominal surgery, initially water-soluble contrast was planned, however prominent gastroesophageal reflux of contrast was noted early on, and the decision was made to switch contrast to thin barium. Water-soluble contrast was fully aspirated from the stomach. Subsequently, barium contrast was slowly administered.  The stomach appears small. No gastrojejunostomy is evident. After a short delay, barium emptied the stomach into the proximal duodenum. The second portion of the duodenum is dilated.  After 20 additional min significantly dilated proximal jejunum is opacified, with the small bowel caliber up to 58 mm.  At this point study was discussed with Dr. Judeth Horn . We decided to inject the percutaneous jejunostomy tube, and give the barium more time to opacified a small bowel in hopes of loose to dating the small bowel obstruction transition  point.  Barium injection through the jejunostomy tube demonstrates normal small bowel loops at the jejunostomy site. This barium was then aspirated.  Subsequently a 3 hr delayed image was obtained demonstrating a gradual tapering of small bowel loops into the right lower abdomen, heading toward the vicinity of the jejunostomy. These smaller loops measure 23 mm diameter (arrow).  At this point, Dr. Hulen Skains advised that no additional imaging was needed.  IMPRESSION: 1. High-grade small bowel obstruction. Dilated duodenum and proximal jejunum up to 58 mm diameter. 2. Small bowel loops slowly taper to the right lower abdomen in the vicinity of the percutaneous jejunostomy. 3. The jejunostomy was injected with contrast, and is situated within normal decompressed small bowel. 4. Diminutive stomach. No gastrojejunostomy is evident. Prominent gastroesophageal reflux. 5. At the beginning of the exam the patient's NG tube was malpositioned. This was withdrawn to the nasopharynx and repositioned into the stomach under fluoroscopic guidance.   Electronically Signed   By: Lars Pinks M.D.   On: 06/28/2013 16:29      CBC  Recent Labs Lab 07/10/13 0540 07/11/13 0500 07/12/13 0500 07/13/13 0500 07/14/13 0415  WBC 4.9 7.8 4.8 5.0 4.7  HGB 8.4* 6.2* 11.4* 10.7* 10.3*  HCT 25.8* 18.7* 33.5* 31.5* 30.2*  PLT 93* 96* 123* 153 144*  MCV 97.7 98.4 90.3 90.8 91.2  MCH 31.8 32.6 30.7 30.8 31.1  MCHC 32.6 33.2 34.0 34.0 34.1  RDW 17.0* 17.4* 18.6* 18.2* 17.9*    Chemistries   Recent Labs Lab 07/08/13 0430  07/10/13 0540 07/11/13 0500 07/12/13 0500 07/13/13 0500 07/14/13 0415  NA 145  < > 137 134* 139 144 146  K 4.6  < > 4.4 2.5* 4.5 3.2* 3.1*  CL 111  < > 101 108 106 111 112  CO2 27  < > 22 16* _0 GLUCOSE 102*  < > 103* 306* 95 89 81  BUN <3*  < > 4* 5* 6 4* 3*  CREATININE 0.62  < > 0.55 0.37* 0.51 0.51 0.59  CALCIUM 7.9*  < > 7.5* 5.0* 8.3* 7.7* 7.9*  MG 1.9  --   --   --  1.8  --  2.0  AST  --    --   --   --  _1 ALT  --   --   --   --  _2 ALKPHOS  --   --   --   --  61 53 49  BILITOT  --   --   --   --  0.5 0.3 0.3  < > = values in  this interval not displayed. ------------------------------------------------------------------------------------------------------------------ estimated creatinine clearance is 84.2 ml/min (by C-G formula based on Cr of 0.59). ------------------------------------------------------------------------------------------------------------------  Recent Labs  07/12/13 0500  HGBA1C 4.7   ------------------------------------------------------------------------------------------------------------------ No results found for this basename: CHOL, HDL, LDLCALC, TRIG, CHOLHDL, LDLDIRECT,  in the last 72 hours ------------------------------------------------------------------------------------------------------------------ No results found for this basename: TSH, T4TOTAL, FREET3, T3FREE, THYROIDAB,  in the last 72 hours ------------------------------------------------------------------------------------------------------------------ No results found for this basename: VITAMINB12, FOLATE, FERRITIN, TIBC, IRON, RETICCTPCT,  in the last 72 hours  Coagulation profile No results found for this basename: INR, PROTIME,  in the last 168 hours  No results found for this basename: DDIMER,  in the last 72 hours  Cardiac Enzymes No results found for this basename: CK, CKMB, TROPONINI, MYOGLOBIN,  in the last 168 hours ------------------------------------------------------------------------------------------------------------------ No components found with this basename: POCBNP,      Time Spent in minutes  35   SINGH,PRASHANT K M.D on 07/14/2013 at 9:09 AM  Between 7am to 7pm - Pager - [phone]  After 7pm go to [email or web address] - password [email or web address]  And look for the night coverage person covering for me after hours  [facility] Office   [phone]

## 2013-07-14 NOTE — Progress Notes (Signed)
Subjective: Patient is awake and alert.  She has no complaints.  She has had a protracted illness.  After significant decline was admitted in December of last year.  At that time was felt to be in status epilepticus.  Aborting her seizures completed took high doses of medication and seizure activity was protracted.  Patient did not immediately return to baseline and there was felt to be some degree of encephalopathy related to the prolonged seizure activity,.  Initial abnormalities noted on MR imaging at that time were showing improvement.  Due to concern over the etiology of the seizure activity patient had a full work up that included a LP to rule out encephalitis.  All titers returned normal.  Patient was showing clinical improvement and was therefore discharged to be followed up on an outpatient basis.  Patient did not return to baseline though and was readmitted.  With readmission serum titers for encephalitic etiologies were obtained and the patient was empirically treated with steroids and Solumedrol.  Patient received greater than 5 doses.  Although there has been some improvement noted again prior to the patient returning to baseline she has experienced a setback of sepsis.  PLEX catheter was removed and patient is receiving antibiotics.  She has had no further seizure activity and remains on anticonvulsant therapy.    Objective: Current vital signs: BP 125/66  Pulse 104  Temp(Src) 99.2 F (37.3 C) (Oral)  Resp 20  Ht 5\' 3"  (1.6 m)  Wt 81.6 kg (179 lb 14.3 oz)  BMI 31.88 kg/m2  SpO2 95% Vital signs in last 24 hours: Temp:  [97 F (36.1 C)-99.2 F (37.3 C)] 99.2 F (37.3 C) (02/15 1448) Pulse Rate:  [88-111] 104 (02/15 1448) Resp:  [0-24] 20 (02/15 1448) BP: (100-136)/(45-114) 125/66 mmHg (02/15 1448) SpO2:  [95 %-100 %] 95 % (02/15 1448) Weight:  [81.6 kg (179 lb 14.3 oz)] 81.6 kg (179 lb 14.3 oz) (02/15 0500)  Intake/Output from previous day: 02/14 0701 - 02/15 0700 In: 3657.5  [P.O.:300; I.V.:1312.5; NG/GT:900; IV Piggyback:1145] Out: -  Intake/Output this shift: Total I/O In: 800 [I.V.:60; Other:260; NG/GT:180; IV Piggyback:300] Out: -  Nutritional status: NPO  Neurologic Exam: Mental Status: Alert and awake.  Thinks she is in a NH in [place].  Reports that it is 2012.  Speech fluent without evidence of aphasia.  Able to follow commands without difficulty. Cranial Nerves: II: Discs flat bilaterally; Visual fields grossly normal, pupils equal, round, reactive to light and accommodation [name],IV, VI: ptosis not present, extra-ocular motions intact bilaterally V,VII: smile symmetric, facial light touch sensation normal bilaterally VIII: hearing normal bilaterally IX,X: gag reflex present XI: bilateral shoulder shrug XII: midline tongue extension Motor: Lifts both upper extremities easily off the bed.  Has great difficulty lifting the lower extremities off the bed but is able to move all muscle groups.   Sensory: Pinprick and light touch intact throughout, bilaterally Deep Tendon Reflexes: 1+ and symmetric throughout Plantars: Right: mute   Left: mute Cerebellar: normal finger-to-nose bilaterally   Lab Results: Basic Metabolic Panel:  Recent Labs Lab 07/08/13 0430  07/10/13 0540 07/11/13 0500 07/12/13 0500 07/13/13 0500 07/14/13 0415  NA 145  < > 137 134* 139 144 146  K 4.6  < > 4.4 2.5* 4.5 3.2* 3.1*  CL 111  < > 101 108 106 111 112  CO2 27  < > 22 16* 22 24 26   GLUCOSE 102*  < > 103* 306* 95 89 81  BUN <3*  < >  4* 5* 6 4* 3*  CREATININE 0.62  < > 0.55 0.37* 0.51 0.51 0.59  CALCIUM 7.9*  < > 7.5* 5.0* 8.3* 7.7* 7.9*  MG 1.9  --   --   --  1.8  --  2.0  PHOS 2.7  --   --   --   --   --   --   < > = values in this interval not displayed.  Liver Function Tests:  Recent Labs Lab 07/12/13 0500 07/13/13 0500 07/14/13 0415  AST 19 15 16   ALT 10 9 9   ALKPHOS 61 53 49  BILITOT 0.5 0.3 0.3  PROT 4.6* 4.2* 4.0*  ALBUMIN 2.0* 1.8* 1.8*    No results found for this basename: LIPASE, AMYLASE,  in the last 168 hours No results found for this basename: AMMONIA,  in the last 168 hours  CBC:  Recent Labs Lab 07/10/13 0540 07/11/13 0500 07/12/13 0500 07/13/13 0500 07/14/13 0415  WBC 4.9 7.8 4.8 5.0 4.7  HGB 8.4* 6.2* 11.4* 10.7* 10.3*  HCT 25.8* 18.7* 33.5* 31.5* 30.2*  MCV 97.7 98.4 90.3 90.8 91.2  PLT 93* 96* 123* 153 144*    Cardiac Enzymes: No results found for this basename: CKTOTAL, CKMB, CKMBINDEX, TROPONINI,  in the last 168 hours  Lipid Panel: No results found for this basename: CHOL, TRIG, HDL, CHOLHDL, VLDL, LDLCALC,  in the last 168 hours  CBG:  Recent Labs Lab 07/13/13 2114 07/14/13 0038 07/14/13 0410 07/14/13 0800 07/14/13 1710  GLUCAP 81 85 79 78 85    Microbiology: Results for orders placed during the hospital encounter of 06/19/13  CLOSTRIDIUM DIFFICILE BY PCR     Status: None   Collection Time    06/19/13  5:14 PM      Result Value Ref Range Status   C difficile by pcr NEGATIVE  NEGATIVE Final  MRSA PCR SCREENING     Status: None   Collection Time    06/19/13  5:14 PM      Result Value Ref Range Status   MRSA by PCR NEGATIVE  NEGATIVE Final   Comment:            The GeneXpert MRSA Assay (FDA     approved for NASAL specimens     only), is one component of a     comprehensive MRSA colonization     surveillance program. It is not     intended to diagnose MRSA     infection nor to guide or     monitor treatment for     MRSA infections.  STOOL CULTURE     Status: None   Collection Time    06/19/13  5:14 PM      Result Value Ref Range Status   Specimen Description STOOL   Final   Special Requests NONE   Final   Culture     Final   Value: NO SALMONELLA, SHIGELLA, CAMPYLOBACTER, YERSINIA, OR E.COLI 0157:H7 ISOLATED     Performed at [facility]   Report Status 06/23/2013 FINAL   Final  CULTURE, BLOOD (ROUTINE X 2)     Status: None   Collection Time    06/23/13  2:02  PM      Result Value Ref Range Status   Specimen Description BLOOD LEFT ARM   Final   Special Requests BOTTLES DRAWN AEROBIC ONLY 10CC   Final   Culture  Setup Time     Final   Value: 06/23/2013 19:55  Performed at [facility]     Final   Value: NO GROWTH 5 DAYS     Performed at [facility]   Report Status 06/29/2013 FINAL   Final  CULTURE, BLOOD (ROUTINE X 2)     Status: None   Collection Time    06/23/13  2:14 PM      Result Value Ref Range Status   Specimen Description BLOOD LEFT HAND   Final   Special Requests BOTTLES DRAWN AEROBIC ONLY 5.5 CC   Final   Culture  Setup Time     Final   Value: 06/23/2013 19:55     Performed at [facility]   Culture     Final   Value: NO GROWTH 5 DAYS     Performed at [facility]   Report Status 06/29/2013 FINAL   Final  CLOSTRIDIUM DIFFICILE BY PCR     Status: None   Collection Time    06/23/13  5:08 PM      Result Value Ref Range Status   C difficile by pcr NEGATIVE  NEGATIVE Final  URINE CULTURE     Status: None   Collection Time    06/23/13  6:15 PM      Result Value Ref Range Status   Specimen Description URINE, CLEAN CATCH   Final   Special Requests NONE   Final   Culture  Setup Time     Final   Value: 06/24/2013 01:22     Performed at [facility] Count     Final   Value: NO GROWTH     Performed at [facility]   Culture     Final   Value: NO GROWTH     Performed at [facility]   Report Status 06/24/2013 FINAL   Final  CULTURE, BLOOD (ROUTINE X 2)     Status: None   Collection Time    07/03/13  6:40 AM      Result Value Ref Range Status   Specimen Description BLOOD LEFT HAND   Final   Special Requests BOTTLES DRAWN AEROBIC ONLY 2CC   Final   Culture  Setup Time     Final   Value: 07/03/2013 13:19     Performed at [facility]   Culture     Final   Value: NO GROWTH 5 DAYS     Performed at [facility]   Report Status  07/09/2013 FINAL   Final  CULTURE, BLOOD (ROUTINE X 2)     Status: None   Collection Time    07/03/13  6:55 AM      Result Value Ref Range Status   Specimen Description BLOOD LEFT ARM   Final   Special Requests BOTTLES DRAWN AEROBIC ONLY 1CC   Final   Culture  Setup Time     Final   Value: 07/03/2013 13:19     Performed at [facility]   Culture     Final   Value: NO GROWTH 5 DAYS     Performed at [facility]   Report Status 07/09/2013 FINAL   Final  CLOSTRIDIUM DIFFICILE BY PCR     Status: None   Collection Time    07/06/13 10:27 AM      Result Value Ref Range Status   C difficile by pcr NEGATIVE  NEGATIVE Final  URINE CULTURE     Status: None  Collection Time    07/10/13  8:15 AM      Result Value Ref Range Status   Specimen Description URINE, CATHETERIZED   Final   Special Requests NONE   Final   Culture  Setup Time     Final   Value: 07/10/2013 09:13     Performed at [facility] Count     Final   Value: 50,000 COLONIES/ML     Performed at [facility]   Culture     Final   Value: KLEBSIELLA PNEUMONIAE     YEAST     Performed at [facility]   Report Status 07/13/2013 FINAL   Final   Organism ID, Bacteria KLEBSIELLA PNEUMONIAE   Final  CLOSTRIDIUM DIFFICILE BY PCR     Status: None   Collection Time    07/10/13  8:33 AM      Result Value Ref Range Status   C difficile by pcr NEGATIVE  NEGATIVE Final  CULTURE, BLOOD (ROUTINE X 2)     Status: None   Collection Time    07/10/13 10:55 AM      Result Value Ref Range Status   Specimen Description BLOOD LEFT ARM   Final   Special Requests BOTTLES DRAWN AEROBIC ONLY 8CC   Final   Culture  Setup Time     Final   Value: 07/10/2013 16:23     Performed at [facility]   Culture     Final   Value: KLEBSIELLA PNEUMONIAE     Note: Gram Stain Report Called to,Read Back By and Verified With: HOLLY CHURCH 07/11/13 AT 0330 RIDK     Performed at [facility]    Report Status 07/13/2013 FINAL   Final   Organism ID, Bacteria KLEBSIELLA PNEUMONIAE   Final  MRSA PCR SCREENING     Status: None   Collection Time    07/10/13 12:55 PM      Result Value Ref Range Status   MRSA by PCR NEGATIVE  NEGATIVE Final   Comment:            The GeneXpert MRSA Assay (FDA     approved for NASAL specimens     only), is one component of a     comprehensive MRSA colonization     surveillance program. It is not     intended to diagnose MRSA     infection nor to guide or     monitor treatment for     MRSA infections.  CULTURE, BLOOD (ROUTINE X 2)     Status: None   Collection Time    07/12/13 10:40 AM      Result Value Ref Range Status   Specimen Description BLOOD LEFT FOREARM   Final   Special Requests BOTTLES DRAWN AEROBIC AND ANAEROBIC 10CC   Final   Culture  Setup Time     Final   Value: 07/12/2013 14:02     Performed at [facility]   Culture     Final   Value:        BLOOD CULTURE RECEIVED NO GROWTH TO DATE CULTURE WILL BE HELD FOR 5 DAYS BEFORE ISSUING A FINAL NEGATIVE REPORT     Performed at [facility]   Report Status PENDING   Incomplete  CULTURE, BLOOD (ROUTINE X 2)     Status: None   Collection Time    07/12/13 10:50 AM      Result Value  Ref Range Status   Specimen Description BLOOD LEFT ARM   Final   Special Requests BOTTLES DRAWN AEROBIC AND ANAEROBIC 10CC   Final   Culture  Setup Time     Final   Value: 07/12/2013 14:02     Performed at [facility]   Culture     Final   Value:        BLOOD CULTURE RECEIVED NO GROWTH TO DATE CULTURE WILL BE HELD FOR 5 DAYS BEFORE ISSUING A FINAL NEGATIVE REPORT     Performed at [facility]   Report Status PENDING   Incomplete    Coagulation Studies: No results found for this basename: LABPROT, INR,  in the last 72 hours  Imaging: Ir Replc Duoden/jejuno Tube Percut W/fluoro  07/13/2013   CLINICAL DATA:  Occluded surgical jejunostomy requiring exchange.  EXAM:  JEJUNOSTOMY TUBE EXCHANGE  CONTRAST:  10 ml Omnipaque-300  FLUOROSCOPY TIME:  1 min and 24 seconds.  PROCEDURE: Informed consent was obtained by telephone from the patient's son.  The pre-existing jejunostomy and surrounding skin were prepped with Betadine. Attempt was made to inject contrast via the pre-existing tube. The tube was cut and removed over a hydrophilic guidewire. A new 12 French red rubber jejunostomy catheter was advanced over the wire.  Final catheter position was confirmed with a fluoroscopic spot image obtained after injection of contrast. The catheter was secured at the exit site with a silk retention suture.  COMPLICATIONS: None.  FINDINGS: The pre-existing 55 French tube is occluded. A new 12 French tube was placed and advanced into the jejunum.  IMPRESSION: Replacement of occluded 10 French jejunostomy catheter. The tube was up sized to a 12 [place] catheter advanced into the jejunum over a guidewire.   Electronically Signed   By: Irish Lack M.D.   On: 07/13/2013 15:54    Medications:  I have reviewed the patient's current medications. Scheduled: . chlorhexidine  15 mL Mouth Rinse BID  . ciprofloxacin  400 mg Intravenous Q12H  . cloNIDine  0.1 mg Transdermal Weekly  . enoxaparin (LOVENOX) injection  40 mg Subcutaneous Q24H  . ferric gluconate (FERRLECIT/NULECIT) IV  125 mg Intravenous Daily  . free water  140 mL Per Tube QID  . levETIRAcetam  1,000 mg Oral BID  . metoprolol tartrate  100 mg Oral BID  . mirtazapine  15 mg Oral QHS  . pantoprazole sodium  40 mg Per Tube Q1200  . risperiDONE  0.5 mg Oral BID  . sodium chloride  10 mL Intravenous Q12H  . Valproic Acid  500 mg Oral TID    Assessment/Plan: 53 year old female with a complicated medical history.  Has had an altered mental status.  All titers unremarkable.  Given an empiric trial of PLEX and steroids.  Currently being treated for infection.  No further seizure activity noted.    Recommendations: 1.  No  further PLEX indicated.  Patients do not always see immediate improvement and would give some time to see if she really benefits from this therapy.  This will also allow her to improve from her infection and any effects this may be contributing as well.   2.  Due to family concerns will notify Dr. Pearlean Brownie that they wish for him to visit her on rounds in the morning and review her care to date to ensure no further studies should be obtained.     LOS: 25 days   Thana Farr, MD [facility] [phone] 07/14/2013  5:56 PM

## 2013-07-15 LAB — COMPREHENSIVE METABOLIC PANEL
ALBUMIN: 1.9 g/dL — AB (ref 3.5–5.2)
ALT: 9 U/L (ref 0–35)
AST: 15 U/L (ref 0–37)
Alkaline Phosphatase: 55 U/L (ref 39–117)
BUN: <3 mg/dL — ABNORMAL LOW (ref 6–23)
CALCIUM: 7.7 mg/dL — AB (ref 8.4–10.5)
CO2: 24 mEq/L (ref 19–32)
CREATININE: 0.55 mg/dL (ref 0.50–1.10)
Chloride: 109 mEq/L (ref 96–112)
GFR calc Af Amer: >90 mL/min (ref >90)
GFR calc non Af Amer: >90 mL/min (ref >90)
Glucose, Bld: 101 mg/dL — ABNORMAL HIGH (ref 70–99)
Potassium: 2.5 mEq/L — CL (ref 3.7–5.3)
Sodium: 143 mEq/L (ref 137–147)
Total Bilirubin: 0.3 mg/dL (ref 0.3–1.2)
Total Protein: 4.3 g/dL — ABNORMAL LOW (ref 6.0–8.3)

## 2013-07-15 LAB — POTASSIUM: Potassium: 4.5 mEq/L (ref 3.7–5.3)

## 2013-07-15 LAB — GLUCOSE, CAPILLARY
GLUCOSE-CAPILLARY: 91 mg/dL (ref 70–99)
Glucose-Capillary: 101 mg/dL — ABNORMAL HIGH (ref 70–99)
Glucose-Capillary: 81 mg/dL (ref 70–99)
Glucose-Capillary: 87 mg/dL (ref 70–99)
Glucose-Capillary: 95 mg/dL (ref 70–99)
Glucose-Capillary: 99 mg/dL (ref 70–99)

## 2013-07-15 MED ORDER — LOPERAMIDE HCL 1 MG/5ML PO LIQD
4.0000 mg | Freq: Three times a day (TID) | ORAL | Status: DC | PRN
  Filled 2013-07-15: qty 20

## 2013-07-15 MED ORDER — POTASSIUM CHLORIDE 10 MEQ/100ML IV SOLN
10.0000 meq | INTRAVENOUS | Status: AC
  Administered 2013-07-15 (×6): 10 meq via INTRAVENOUS
  Filled 2013-07-15 (×6): qty 100

## 2013-07-15 MED ORDER — POTASSIUM CHLORIDE 20 MEQ/15ML (10%) PO LIQD
40.0000 meq | Freq: Four times a day (QID) | ORAL | Status: AC
  Administered 2013-07-15 (×2): 40 meq via ORAL
  Filled 2013-07-15 (×2): qty 30

## 2013-07-15 MED ORDER — POTASSIUM CHLORIDE 20 MEQ/15ML (10%) PO LIQD
40.0000 meq | Freq: Two times a day (BID) | ORAL | Status: DC
  Administered 2013-07-15 – 2013-07-16 (×2): 40 meq via ORAL
  Filled 2013-07-15 (×3): qty 30

## 2013-07-15 MED ORDER — POTASSIUM CHLORIDE 20 MEQ/15ML (10%) PO LIQD
40.0000 meq | Freq: Every day | ORAL | Status: DC
  Filled 2013-07-15: qty 30

## 2013-07-15 NOTE — Progress Notes (Signed)
Patient Demographics  Joan [name], is a 53 y.o. female, DOB - 1961-01-06, IRW:[id number]  Admit date - 06/19/2013   Admitting Physician Charlynne Cousins, MD  Outpatient Primary MD for the patient is [name],[name], MD  LOS - 26   No chief complaint on file.       Assessment & Plan    Assumed care of the patient on 07/10/2013 on day 21 of her hospital stay   HPI/Subjective:   53 yr old female presents with a CC of Altered mental status. Pt has a PMH of peripheral neuropathy, seizures, catatonia, anxiety, HTN, and fibromyalgia. Pt was previously admitted on 12/13 thru 12/28 with toxic metabolic encephalopathy. 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 EEG showed no recurrent seizures. A 24 hour EEG showed an intermittent delta pattern felt to represent Frontal Intermittent Rhythmic Delta Activity. Pt was started on two antiepileptics.     This admission, she was again found altered and seemed "comatose" . Since d/c in December, pt has had persistent confusion and is confined to a wheelchair at [facility]. She is currently confused and experiencing visual hallucinations.She is s/p J tube placement and followed with intractable nausea, vomiting. Surgery the patient and she underwent J-tube revision with extraocular on 07/02/13.    Patient subsequently on 07/10/2013 was seen, she was found to be persistently hypotensive, tachycardic, febrile, despite 3 L of IV fluids her blood pressure stayed around 80 systolic, pulmonary critical care was consulted and patient was transferred to step down unit. Possible sources of infection aspiration with hospital-acquired pneumonia, intra-abdominal sepsis due to recent J-tube reversal, dialysis catheter which  has been placed about 2 weeks ago.   Neuro event summary   Patient is awake and alert. She has no complaints. She has had a protracted illness. After significant decline was admitted in December of last year. At that time was felt to be in status epilepticus. Aborting her seizures completed took high doses of medication and seizure activity was protracted. Patient did not immediately return to baseline and there was felt to be some degree of encephalopathy related to the prolonged seizure activity,. Initial abnormalities noted on MR imaging at that time were showing improvement. Due to concern over the etiology of the seizure activity patient had a full work up that included a LP to rule out encephalitis. All titers returned normal. Patient was showing clinical improvement and was therefore discharged to be followed up on an outpatient basis. Patient did not return to baseline though and was readmitted.       With readmission serum titers for encephalitic etiologies were obtained and the patient was empirically treated with steroids and Solumedrol. Patient received greater than 5 doses along with PLEX Rx, there has been some clinical improvement in the patient's neurological picture, of note patient has been bedbound for close to 3-4 months according to the son, likely now close to her new baseline which was prior to this admission. She has had no further seizure activity and remains on anticonvulsant therapy.         Assessment/Plan:    Sepsis on 07/10/2013 - she was found to be persistently hypotensive, tachycardic, febrile, was transferred to step down unit  to 07/10/2013, critical care team was consulted, Gram bacteria on 1/2 B cultures, this likely UTI versus dialysis catheter related infection (although only 50,000 colonies growing in the urine the species and sensitivity are identical so infection more likely UTI)  also had a recent intra-abdominal sepsis due to recent J-tube reversal,  and had a dialysis catheter which has been placed about 2 weeks ago. Change foley 07-11-13.  Been placed on appropriate broad-spectrum antibiotics with improvement, noted cultures and has left her on IV Cipro only at this time, dialysis catheter removed on 07/11/2013, blood pressure better with IV fluids, lactic acid improvement in critical care monitoring the patient also. General surgery on board for J-tube reversal surgery. Abdominal x-rays appear stable.  Will require total 14 days of antibiotics for gram-negative bacteremia. Antibiotics start date 07/10/2013       Severe hypokalemia.  Persistent due to ongoing diarrhea, she C. difficile negative and placed on Imodium. Aggressive IV and oral potassium supplementation. Magnesium stable. Monitor potassium closely.       Nausea&Vomiting recent J-tube surgery also initially has diarrhea   C. difficile negative and diarrhea has improved, supportive care for that. Abdominal x-rays do not show any evidence of obstruction, J-tube clog cleared by IR on 07/13/2013 Tube feeding resumed on 07/13/2013 via J-tube including free water flushes, she developed some profuse diarrhea with tube feeds. Dietitian reconsulted. -Speech following only pills by mouth      Anemia   Unclear etiology, s/p 2 unit s of prbc transfusion and started her on protonix . Her stool for occult blood negative in the last 2 weeks. Anemia panel showed severe iron deficiency.  We'll replace IV IRON ON 2/15/ 2015 AS NOW SEPSIS IN GOOD CONTROL.      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 Pt received diatech catheter on 06/20/2013 placement and received and finished plasmapheresis returns, case discussed with neurologist Dr. Doy Mince on 07/11/2013.  -MRI of the brain showed no acute abnormality.-Ammonia level is normal . [facility]  paraneoplastic panel reported to be negative, MRI of the C-spine is negative for acute findings   - We'll continue to monitor with supportive care, so far minimal improvement.  - started PT-OT       Tachycardia  - Has been problem for the last several weeks, despite clinical improvement in sepsis she remains in sinus tachycardia, volume status appears appropriate, does not appear to be in any pain or discomfort, stable TSH and echogram, CT angina negative for PE. -Place on on po metoprolol IV while n.p.o.      Hx of Convulsions/Seizures  -Pt has a PMH of seizures and catatonia.  -MRI of the brain showed thalamic signal abnormality during previous admission. No longer present on current MRI.  -Pt currently taking Keppra and Depakene. Increased Depakene to 575m tid       Hx of Anxiety  -Continue pt on Remeron  -Patient appears to have had a psychiatric history  -Current illness is bringing out psychiatric symptoms.  -Psychiatry consulted as psychotropic medications may help with her current symptoms.  -Psych prescribed Risperdal .5 mg bid  Ativan prn for anxiety.       Hypertension  -Pt has a PMH of HTN  -Likely secondary to anxiety  Continue metoprolol and prn hydralazine.       Malnutrition with history of Gastric Bypass  -Per SNF patient has had  no PO intake for over 1 week prior to admission.  -SNF sent her to hospital for J tube placement.  -Gen Surg consulted, J-tube placed and underwent revision of j tube with exploratory laparotomy on 2/3 and small bowel resection.      Recent hx of c-diff (05/18/13)  -Pt negative for C-diff at this admit      Thrombocytopenia  -No acute bleeding monitor     Enterovaginal fistula  -Reported prior to that this patient had some stools in her vagina.  -Discussed with radiology, no evidence of rectovaginal fistula and the previous imaging, (patient had indicated pelvic scan done previously).  - Consulted GI,  and the did not feel that patient will benefit from colonoscopy for the diagnosis of fistula.  - Called and discussed with [facility] on-call, and they recommend to follow the patient as outpatient. No further workup required in the hospital.       Code Status: full   Family Communication: Updated son in detail over the phone on 07/13/2013   Disposition Plan: SNF    SIGNIFICANT EVENTS / STUDIES:  1/21- admitted  1/23 - CT Chest . No evidence of malignancy or other significant abnormality of the chest  1/24 - MRI C-spine > No evidence of cervical disc herniation. Minimal degenerative changes upper thoracic spine  1/27 - J tube placed  2/3 - Small bowel resection and J-tube revision.  2/11 - Abd xray > no bowel obstruction, possible mild ileus  07/05/2013 echocardiogram stable EF of 60% 11/16/2013 Lower extremity venous duplex no DVT   LINES / TUBES:  R Permacath 1/22 >>> removed on 07/10/2013 R PICC 1/20 >>> Initial placement Jtube 1/27 >>> expiratory lap with the revision of J-tube on 07/02/2013 by general surgery Flexiseal  J-tube clogg removed by IR on 07/13/2013   CULTURES:  C-Diff 1/21 > Neg  Stool 1/21 > Neg  C-diff 1/25 > Neg  Urine 1/25 > Neg  ______________  ______________  Blood 2/4 > Neg  C-Diff 2/7 > Neg  Blood 2/11 >>>ng  Urine 2/11 >>>  C-diff 2/11 >>>neg  Blood 1/25 > Neg Blood 2/11 > gram -ve rods    Consults  Gen. surgery, neurology, PC CM, GYN   Medications  Scheduled Meds: . chlorhexidine  15 mL Mouth Rinse BID  . ciprofloxacin  400 mg Intravenous Q12H  . cloNIDine  0.1 mg Transdermal Weekly  . enoxaparin (LOVENOX) injection  40 mg Subcutaneous Q24H  . ferric gluconate (FERRLECIT/NULECIT) IV  125 mg Intravenous Daily  . free water  140 mL Per Tube QID  . levETIRAcetam  1,000 mg Oral BID  . metoprolol tartrate  100 mg Oral BID  . mirtazapine  15 mg Oral QHS  . pantoprazole sodium  40 mg Per Tube Q1200  . potassium chloride  10 mEq  Intravenous Q1 Hr x 6  . potassium chloride  40 mEq Oral Q6H  . potassium chloride  40 mEq Oral BID  . risperiDONE  0.5 mg Oral BID  . sodium chloride  10 mL Intravenous Q12H  . Valproic Acid  500 mg Oral TID   Continuous Infusions: . citrate dextrose    . dextrose 20 mL/hr at 07/14/13 0800  . feeding supplement (VITAL AF 1.2 CAL) 1,000 mL (07/14/13 0800)   PRN Meds:.acetaminophen, acetaminophen, HYDROmorphone (DILAUDID) injection, loperamide, metoprolol, ondansetron, promethazine, sodium chloride, sodium chloride  DVT Prophylaxis Lovenox   Lab Results  Component Value Date   PLT 144* 07/14/2013    Antibiotics  Anti-infectives   Start     Dose/Rate Route Frequency Ordered Stop   07/11/13 1230  ciprofloxacin (CIPRO) IVPB 400 mg     400 mg 200 mL/hr over 60 Minutes Intravenous Every 12 hours 07/11/13 1151     07/10/13 2100  vancomycin (VANCOCIN) IVPB 750 mg/150 ml premix  Status:  Discontinued     750 mg 150 mL/hr over 60 Minutes Intravenous Every 8 hours 07/10/13 1215 07/11/13 1103   07/10/13 1400  aztreonam (AZACTAM) 1 g in dextrose 5 % 50 mL IVPB  Status:  Discontinued     1 g 100 mL/hr over 30 Minutes Intravenous 3 times per day 07/10/13 1215 07/13/13 1111   07/10/13 1230  vancomycin (VANCOCIN) IVPB 1000 mg/200 mL premix     1,000 mg 200 mL/hr over 60 Minutes Intravenous  Once 07/10/13 1215 07/10/13 1422   07/02/13 1330  ciprofloxacin (CIPRO) IVPB 400 mg     400 mg 200 mL/hr over 60 Minutes Intravenous To Surgery 07/02/13 1321 07/02/13 1326   06/23/13 1400  piperacillin-tazobactam (ZOSYN) IVPB 3.375 g  Status:  Discontinued     3.375 g 12.5 mL/hr over 240 Minutes Intravenous 3 times per day 06/23/13 1336 06/26/13 1441   06/20/13 1200  vancomycin (VANCOCIN) IVPB 1000 mg/200 mL premix     1,000 mg 200 mL/hr over 60 Minutes Intravenous  Once 06/20/13 1146 06/20/13 1433          Subjective:   Coralie Carpen today is in hospital bed, does not appear to be in any  discomfort however appears febrile, unable to answer questions  Objective:   Filed Vitals:   07/14/13 0900 07/14/13 1000 07/14/13 1448 07/14/13 2026  BP: 112/45 106/62 125/66 140/67  Pulse: 97 98 104 105  Temp:   99.2 F (37.3 C) 98 F (36.7 C)  TempSrc:   Oral Oral  Resp: 18 0 20 18  Height:      Weight:    82.6 kg (182 lb 1.6 oz)  SpO2: 100% 98% 95% 99%    Wt Readings from Last 3 Encounters:  07/14/13 82.6 kg (182 lb 1.6 oz)  07/14/13 82.6 kg (182 lb 1.6 oz)  07/14/13 82.6 kg (182 lb 1.6 oz)     Intake/Output Summary (Last 24 hours) at 07/15/13 1112 Last data filed at 07/15/13 0013  Gross per 24 hour  Intake 284.34 ml  Output      0 ml  Net 284.34 ml     Physical Exam  Awake, not alert, No new F.N deficits, Normal affect [place].AT,PERRAL Supple Neck,No JVD, No cervical lymphadenopathy appriciated.  Symmetrical Chest wall movement, Good air movement bilaterally, CTAB RRR,No Gallops,Rubs or new Murmurs, No Parasternal Heave +ve B.Sounds, Abd Soft has PEG tube in place, recent surgery scar stable with staples, Non tender, No organomegaly appriciated, No rebound - guarding or rigidity. No Cyanosis, Clubbing or edema, No new Rash or bruise      Data Review   Micro Results Recent Results (from the past 240 hour(s))  CLOSTRIDIUM DIFFICILE BY PCR     Status: None   Collection Time    07/06/13 10:27 AM      Result Value Ref Range Status   C difficile by pcr NEGATIVE  NEGATIVE Final  URINE CULTURE     Status: None   Collection Time    07/10/13  8:15 AM      Result Value Ref Range Status   Specimen Description URINE, CATHETERIZED   Final  Special Requests NONE   Final   Culture  Setup Time     Final   Value: 07/10/2013 09:13     Performed at [place]     Final   Value: 50,000 COLONIES/ML     Performed at [facility]   Culture     Final   Value: KLEBSIELLA PNEUMONIAE     YEAST     Performed at [facility]   Report  Status 07/13/2013 FINAL   Final   Organism ID, Bacteria KLEBSIELLA PNEUMONIAE   Final  CLOSTRIDIUM DIFFICILE BY PCR     Status: None   Collection Time    07/10/13  8:33 AM      Result Value Ref Range Status   C difficile by pcr NEGATIVE  NEGATIVE Final  CULTURE, BLOOD (ROUTINE X 2)     Status: None   Collection Time    07/10/13 10:55 AM      Result Value Ref Range Status   Specimen Description BLOOD LEFT ARM   Final   Special Requests BOTTLES DRAWN AEROBIC ONLY 8CC   Final   Culture  Setup Time     Final   Value: 07/10/2013 16:23     Performed at [facility]   Culture     Final   Value: KLEBSIELLA PNEUMONIAE     Note: Gram Stain Report Called to,Read Back By and Verified With: [place] 07/11/13 AT 0330 RIDK     Performed at [facility]   Report Status 07/13/2013 FINAL   Final   Organism ID, Bacteria KLEBSIELLA PNEUMONIAE   Final  MRSA PCR SCREENING     Status: None   Collection Time    07/10/13 12:55 PM      Result Value Ref Range Status   MRSA by PCR NEGATIVE  NEGATIVE Final   Comment:            The GeneXpert MRSA Assay (FDA     approved for NASAL specimens     only), is one component of a     comprehensive MRSA colonization     surveillance program. It is not     intended to diagnose MRSA     infection nor to guide or     monitor treatment for     MRSA infections.  CULTURE, BLOOD (ROUTINE X 2)     Status: None   Collection Time    07/12/13 10:40 AM      Result Value Ref Range Status   Specimen Description BLOOD LEFT FOREARM   Final   Special Requests BOTTLES DRAWN AEROBIC AND ANAEROBIC 10CC   Final   Culture  Setup Time     Final   Value: 07/12/2013 14:02     Performed at [facility]   Culture     Final   Value:        BLOOD CULTURE RECEIVED NO GROWTH TO DATE CULTURE WILL BE HELD FOR 5 DAYS BEFORE ISSUING A FINAL NEGATIVE REPORT     Performed at [facility]   Report Status PENDING   Incomplete  CULTURE, BLOOD (ROUTINE X 2)      Status: None   Collection Time    07/12/13 10:50 AM      Result Value Ref Range Status   Specimen Description BLOOD LEFT ARM   Final   Special Requests BOTTLES DRAWN AEROBIC AND ANAEROBIC 10CC   Final   Culture  Setup Time     Final   Value: 07/12/2013 14:02     Performed at [facility]   Culture     Final   Value:        BLOOD CULTURE RECEIVED NO GROWTH TO DATE CULTURE WILL BE HELD FOR 5 DAYS BEFORE ISSUING A FINAL NEGATIVE REPORT     Performed at [facility]   Report Status PENDING   Incomplete    Radiology Reports Dg Abd 1 View  06/27/2013   CLINICAL DATA:  Nausea, abdominal pain, vomiting  EXAM: ABDOMEN - 1 VIEW  COMPARISON:  None.  FINDINGS: There is a surgical drain present in the mid abdomen. There are surgical stated in the right paramedian abdominal wall. There is a small amount of contrast scattered throughout the colon. There is no bowel dilatation to suggest obstruction. There is no evidence of pneumoperitoneum, portal venous gas or pneumatosis. There are no pathologic calcifications along the expected course of the ureters.The osseous structures are unremarkable.  IMPRESSION: Nonobstructive bowel gas pattern.   Electronically Signed   By: Kathreen Devoid   On: 06/27/2013 09:34      Ct Head Wo Contrast  06/13/2013   CLINICAL DATA:  Altered mental status. Failure to thrive. Seizures.  EXAM: CT HEAD WITHOUT CONTRAST  TECHNIQUE: Contiguous axial images were obtained from the base of the skull through the vertex without intravenous contrast.  COMPARISON:  05/13/2013  FINDINGS: There is no evidence of intracranial hemorrhage, brain edema, or other signs of acute infarction. There is no evidence of intracranial mass lesion or mass effect. No abnormal extraaxial fluid collections are identified.  Mild diffuse cerebral atrophy is stable. No evidence hydrocephalus. No other intracranial abnormality identified. No skull fracture or other bone lesion identified.  IMPRESSION:  No acute intracranial findings.  Stable mild cerebral atrophy.   Electronically Signed   By: Earle Gell M.D.   On: 06/13/2013 19:53     Ct Chest W Contrast  06/21/2013   CLINICAL DATA:  Altered mental status.  Evaluate for malignancy.  EXAM: CT CHEST WITH CONTRAST  TECHNIQUE: Multidetector CT imaging of the chest was performed during intravenous contrast administration.  CONTRAST:  11m OMNIPAQUE IOHEXOL 300 MG/ML  SOLN  COMPARISON:  Chest x-ray dated 05/11/2013  FINDINGS: Heart size and pulmonary vascularity are normal. There is no hilar or mediastinal adenopathy. The lungs are clear. No mass lesions, infiltrates, or effusions. Soft tissues of the chest are normal. Thyroid gland is normal. No acute osseous abnormality. Osteophytes fuse the thoracic spine from T6 through T10.  IMPRESSION: No evidence of malignancy or other significant abnormality of the chest.   Electronically Signed   By: JRozetta NunneryM.D.   On: 06/21/2013 17:13     Mr BJeri CosWFXContrast  06/20/2013   CLINICAL DATA:  Altered mental status. Possible autoimmune encephalitis.  EXAM: MRI HEAD WITHOUT AND WITH CONTRAST  TECHNIQUE: Multiplanar, multiecho pulse sequences of the brain and surrounding structures were obtained without and with intravenous contrast.  CONTRAST:  152mMULTIHANCE GADOBENATE DIMEGLUMINE 529 MG/ML IV SOLN  COMPARISON:  Head CT 06/13/2013 and brain MRI 05/16/2013  FINDINGS: Images are mildly to moderately degraded by motion artifact.  Incidental note is again made of a partially empty sella. No definite residual abnormal T2 or diffusion weighted signal is identified in the thalami. Mild periventricular T2 hyperintensity is unchanged and nonspecific. No new areas of brain parenchymal signal abnormality are identified. There is mild cerebral atrophy.  There is no evidence of acute infarct. There is no evidence of mass, midline shift, or extra-axial fluid collection. Orbits are unremarkable. Major intracranial vascular flow  voids are unremarkable. Visualized paranasal sinuses and mastoid air cells are clear. There is no abnormal enhancement.  IMPRESSION: Interval resolution of thalamic signal abnormality. No evidence of new intracranial abnormality.   Electronically Signed   By: Logan Bores   On: 06/20/2013 17:26    Mr Cervical Spine Wo Contrast  06/22/2013   CLINICAL DATA:  Right hemiparesis. Possible autoimmune encephalitis.  EXAM: MRI CERVICAL SPINE WITHOUT CONTRAST  TECHNIQUE: Multiplanar, multisequence MR imaging was performed. No intravenous contrast was administered.  COMPARISON:  No comparison cervical spine MR. brain MR 06/20/2013.  FINDINGS: On this motion grade examination, artifact extends through the cervical cord however, no definitive cervical cord signal abnormality is noted. Cervical medullary junction unremarkable. Intracranial structures as detailed on recent MR of the brain.  Visualized paravertebral structures unremarkable. Both vertebral arteries are patent.  C2-3:  Negative.  C3-4:  Minimal right foraminal narrowing.  C4-5:  Minimal right foraminal narrowing.  C5-6:  Negative.  C6-7:  Negative.  C7-T1:  Negative.  T1-2:  Negative.  T2-3: Minimal Schmorl's node deformity.  Minimal bulge.  T3-4: Minimal bulge.  T4-5: Minimal bulge.  IMPRESSION: No evidence of cervical disc herniation.  Minimal degenerative changes upper thoracic spine.  Please see above.   Electronically Signed   By: Chauncey Cruel M.D.   On: 06/22/2013 19:38    US Transvaginal Non-ob  06/21/2013   CLINICAL DATA:  Altered mental status. Evaluation for possible paraneoplastic syndrome.  EXAM: TRANSABDOMINAL AND TRANSVAGINAL ULTRASOUND OF PELVIS  TECHNIQUE: Both transabdominal and transvaginal ultrasound examinations of the pelvis were performed. Transabdominal technique was performed for global imaging of the pelvis including uterus, ovaries, adnexal regions, and pelvic cul-de-sac. It was necessary to proceed with endovaginal exam following  the transabdominal exam to visualize the ovaries.  COMPARISON:  None  FINDINGS: Uterus  Removed.  Right ovary  Not visualized.  Left ovary  Not visualized.  Other findings  No free fluid.  IMPRESSION: The ovaries could not be visualized on transvaginal or transabdominal pelvic ultrasound. However, review of the CT scan of the pelvis dated 05/17/2013 does demonstrate that both ovaries appear normal, measuring 23 mm on the right and 16 mm on the left.   Electronically Signed   By: Rozetta Nunnery M.D.   On: 06/21/2013 16:45   US Pelvis Complete  06/21/2013   CLINICAL DATA:  Altered mental status. Evaluation for possible paraneoplastic syndrome.  EXAM: TRANSABDOMINAL AND TRANSVAGINAL ULTRASOUND OF PELVIS  TECHNIQUE: Both transabdominal and transvaginal ultrasound examinations of the pelvis were performed. Transabdominal technique was performed for global imaging of the pelvis including uterus, ovaries, adnexal regions, and pelvic cul-de-sac. It was necessary to proceed with endovaginal exam following the transabdominal exam to visualize the ovaries.  COMPARISON:  None  FINDINGS: Uterus  Removed.  Right ovary  Not visualized.  Left ovary  Not visualized.  Other findings  No free fluid.  IMPRESSION: The ovaries could not be visualized on transvaginal or transabdominal pelvic ultrasound. However, review of the CT scan of the pelvis dated 05/17/2013 does demonstrate that both ovaries appear normal, measuring 23 mm on the right and 16 mm on the left.   Electronically Signed   By: Rozetta Nunnery M.D.   On: 06/21/2013 16:45   Ir Fluoro Guide Cv Line Right  06/20/2013   CLINICAL DATA:  Encephalitis  and need for tunneled catheter for plasmapheresis.  EXAM: TUNNELED CENTRAL VENOUS CATHETER PLACEMENT WITH ULTRASOUND AND FLUOROSCOPIC GUIDANCE  MEDICATIONS: 1 g IV vancomycin. Vancomycin was given within two hours of incision. Vancomycin was given due to an antibiotic allergy.  ANESTHESIA/SEDATION: 2.0 mg IV Versed; 100 mcg IV  Fentanyl.  Total Moderate Sedation Time  Twenty minutes.  FLUOROSCOPY TIME:  42 seconds.  PROCEDURE: The procedure, risks, benefits, and alternatives were explained to the patient. Questions regarding the procedure were encouraged and answered. The patient understands and consents to the procedure.  The right neck and chest were prepped with chlorhexidine in a sterile fashion, and a sterile drape was applied covering the operative field. Maximum barrier sterile technique with sterile gowns and gloves were used for the procedure. Local anesthesia was provided with 1% lidocaine.  Ultrasound was used to confirm patency of the right internal jugular vein. After creating a small venotomy incision, a 21 gauge needle was advanced into the right internal jugular vein under direct, real-time ultrasound guidance. Ultrasound image documentation was performed. After securing guidewire access, an 8 Fr dilator was placed. A J-wire was kinked to measure appropriate catheter length.  A Bard Equistream tunneled hemodialysis/pheresis catheter measuring 19 cm from tip to cuff was chosen for placement. This was tunneled in a retrograde fashion from the chest wall to the venotomy incision.  At the venotomy, serial dilatation was performed and a 16 Fr peel-away sheath was placed over a guidewire. The catheter was then placed through the sheath and the sheath removed. Final catheter positioning was confirmed and documented with a fluoroscopic spot image. The catheter was aspirated, flushed with saline, and injected with appropriate volume heparin dwells.  The venotomy incision was closed with subcutaneous 3-0 Monocryl and subcuticular 4-0 Vicryl. Dermabond was applied to the incision. The catheter exit site was secured with 0-Prolene retention sutures.  COMPLICATIONS: None.  No pneumothorax.  FINDINGS: After catheter placement, the tips lie in the right atrium. The catheter aspirates normally and is ready for immediate use.  IMPRESSION:  Placement of tunneled pheresis catheter via the right internal jugular vein. The catheter tips lie in the right atrium. The catheter is ready for immediate use.   Electronically Signed   By: Aletta Edouard M.D.   On: 06/20/2013 14:27   Ir Fluoro Guide Cv Line Right  06/18/2013   EXAM: ULTRASOUND AND FLUOROSCOPIC GUIDED PICC LINE INSERTION  MEDICATIONS: None.  TECHNIQUE: The procedure, risks, benefits, and alternatives were explained to the patient's son and informed written consent was obtained. A timeout was performed prior to the initiation of the procedure.  The right upper extremity was prepped with chlorhexidine in a sterile fashion, and a sterile drape was applied covering the operative field. Maximum barrier sterile technique with sterile gowns and gloves were used for the procedure. A timeout was performed prior to the initiation of the procedure. Local anesthesia was provided with 1% lidocaine.  Under direct ultrasound guidance, the right brachial vein was accessed with a micropuncture kit after the overlying soft tissues were anesthetized with 1% lidocaine. An ultrasound image was saved for documentation purposes. A guidewire was advanced to the level of the superior caval-atrial junction for measurement purposes and the PICC line was cut to length. A peel-away sheath was placed and a 36 cm, 5 [place], dual lumen was inserted to level of the superior caval-atrial junction. A post procedure spot fluoroscopic was obtained. The catheter easily aspirated and flushed and was sutured in place. A dressing  was placed. The patient tolerated the procedure well without immediate post procedural complication.  CONTRAST:  None  FLUOROSCOPY TIME:  36 seconds.  COMPLICATIONS: None immediate  FINDINGS: After catheter placement, the tip lies within the superior cavoatrial junction. The catheter aspirates and flushes normally and is ready for immediate use.  IMPRESSION: Successful ultrasound and fluoroscopic guided  placement of a right brachial vein approach, 36 cm, 5 French, dual lumen PICC with tip at the superior caval-atrial junction. The PICC line is ready for immediate use.  INDICATION: Poor venous access, in need of intravenous access for blood draws and medication administration   Electronically Signed   By: Sandi Mariscal M.D.   On: 06/18/2013 15:51      Dg Chest Port 1 View  07/10/2013   CLINICAL DATA:  Shortness of breath.  Hypotension  EXAM: PORTABLE CHEST - 1 VIEW  COMPARISON:  DG CHEST 1V PORT dated 07/03/2013; CT CHEST W/CM dated 06/21/2013  FINDINGS: Right internal jugular dialysis catheter tip: Right atrium. Thoracic spondylosis noted. There is a band of airspace opacity in the right upper lobe just above the minor fissure. Improved aeration at the right lung base.  IMPRESSION: Improved aeration at the right lung base, with but there is a band of airspace opacity in the right upper lobe favoring atelectasis over pneumonia. Next item thoracic spondylosis.   Electronically Signed   By: Sherryl Barters M.D.   On: 07/10/2013 08:27     Dg Chest Port 1 View  07/03/2013   CLINICAL DATA:  Tubes and lines in good position, detailed above.  EXAM: PORTABLE CHEST - 1 VIEW  COMPARISON:  Chest CT 06/21/2013  FINDINGS: There is nasogastric tube which terminates near the pylorus. Right IJ catheter, tip in the upper right atrium. Right upper extremity PICC, tip at the upper cavoatrial junction.  Oral contrast noted within the gastric fundus and small bowel.  Haziness of the right base, with volume loss. Node definitive consolidation.  No cardiomegaly.  IMPRESSION: 1. Negative for pneumonia. 2. Haziness of the right lower chest, likely pleural fluid or atelectasis. 3. Tubes and lines in good position, detailed above.   Electronically Signed   By: Jorje Guild M.D.   On: 07/03/2013 03:35     Dg Abd Portable 1v  07/10/2013   CLINICAL DATA:  Assess for ileus  EXAM: PORTABLE ABDOMEN - 1 VIEW  COMPARISON:  June 30, 2013  FINDINGS: There is persistent air-filled dilated small bowel loops in the left abdomen. Air is noted in the colon. Skin staples are projected over the right abdomen. Patient is status post prior cholecystectomy.  IMPRESSION: Persistent ileus.   Electronically Signed   By: Abelardo Diesel M.D.   On: 07/10/2013 02:30     Dg Abd Portable 1v  06/30/2013   CLINICAL DATA:  NG tube placement.  EXAM: PORTABLE ABDOMEN - 1 VIEW  COMPARISON:  Single view of the abdomen 06/27/2013. Upper GI/small bowel follow-through 06/28/2013  FINDINGS: NG tube is in place in good position with the tip in this distal stomach. Contrast material in small bowel from the comparison upper GI series/small bowel follow-through. Small bowel loops are dilated. Only a small amount of loculated barium is seen in the colon.  IMPRESSION: NG tube in good position.  Small bowel obstruction.   Electronically Signed   By: Inge Rise M.D.   On: 06/30/2013 19:01     Dg Addison Bailey G Tube Plc W/fl-no Rad  06/17/2013   CLINICAL DATA:  patient to have peg tube placement on January 20th   NASO G TUBE PLACEMENT WITH FLUORO  Fluoroscopy was utilized by the requesting physician.  No radiographic  interpretation.     Dg Ugi W/small Bowel  06/28/2013   CLINICAL DATA:  53 year old female postop 2 days since percutaneous jejunostomy tube placement. Drainage from NG tube. Abdominal pain. Initial encounter.  EXAM: UPPER GI SERIES WITH SMALL BOWEL FOLLOW-THROUGH  FLUOROSCOPIC GUIDED NG TUBE PLACEMENT  TECHNIQUE: Combined double contrast and single contrast upper GI series using effervescent crystals, thick barium, and thin barium. Subsequently, serial images of the small bowel were obtained including spot views of the terminal ileum.  COMPARISON:  KUB 06/27/2013 and earlier.  FLUOROSCOPY TIME:  9 min and 18 seconds.  FINDINGS: Preprocedural scout view of the abdomen demonstrates a small volume of loculated barium in the colon. This is related to the 06/17/2013  NG tube placement/ confirmation study.  Scout view demonstrates the recently placed right an mid lower abdomen percutaneous jejunostomy. Right lower abdomen skin staples in place.  The scout view also demonstrates the patient's NG tube is looped back up into the esophagus. This was evaluated in real-time with fluoroscopy, and a wire was placed through the tube in an effort to maneuver the tip back into the stomach. However, this was unsuccessful an the tube had be retracted into the nasopharynx. Using fluoroscopic guidance, the tube was then advanced with a wire into the stomach, such that the tip and side hole are within the stomach.  Given the recent abdominal surgery, initially water-soluble contrast was planned, however prominent gastroesophageal reflux of contrast was noted early on, and the decision was made to switch contrast to thin barium. Water-soluble contrast was fully aspirated from the stomach. Subsequently, barium contrast was slowly administered.  The stomach appears small. No gastrojejunostomy is evident. After a short delay, barium emptied the stomach into the proximal duodenum. The second portion of the duodenum is dilated.  After 20 additional min significantly dilated proximal jejunum is opacified, with the small bowel caliber up to 58 mm.  At this point study was discussed with Dr. Judeth Horn . We decided to inject the percutaneous jejunostomy tube, and give the barium more time to opacified a small bowel in hopes of loose to dating the small bowel obstruction transition point.  Barium injection through the jejunostomy tube demonstrates normal small bowel loops at the jejunostomy site. This barium was then aspirated.  Subsequently a 3 hr delayed image was obtained demonstrating a gradual tapering of small bowel loops into the right lower abdomen, heading toward the vicinity of the jejunostomy. These smaller loops measure 23 mm diameter (arrow).  At this point, Dr. Hulen Skains advised that no  additional imaging was needed.  IMPRESSION: 1. High-grade small bowel obstruction. Dilated duodenum and proximal jejunum up to 58 mm diameter. 2. Small bowel loops slowly taper to the right lower abdomen in the vicinity of the percutaneous jejunostomy. 3. The jejunostomy was injected with contrast, and is situated within normal decompressed small bowel. 4. Diminutive stomach. No gastrojejunostomy is evident. Prominent gastroesophageal reflux. 5. At the beginning of the exam the patient's NG tube was malpositioned. This was withdrawn to the nasopharynx and repositioned into the stomach under fluoroscopic guidance.   Electronically Signed   By: Lars Pinks M.D.   On: 06/28/2013 16:29      CBC  Recent Labs Lab 07/10/13 0540 07/11/13 0500 07/12/13 0500 07/13/13 0500 07/14/13 0415  WBC 4.9 7.8 4.8 5.0  4.7  HGB 8.4* 6.2* 11.4* 10.7* 10.3*  HCT 25.8* 18.7* 33.5* 31.5* 30.2*  PLT 93* 96* 123* 153 144*  MCV 97.7 98.4 90.3 90.8 91.2  MCH 31.8 32.6 30.7 30.8 31.1  MCHC 32.6 33.2 34.0 34.0 34.1  RDW 17.0* 17.4* 18.6* 18.2* 17.9*    Chemistries   Recent Labs Lab 07/11/13 0500 07/12/13 0500 07/13/13 0500 07/14/13 0415 07/15/13 0500  NA 134* 139 144 146 143  K 2.5* 4.5 3.2* 3.1* 2.5*  CL 108 106 111 112 109  CO2 16* _0 GLUCOSE 306* 95 89 81 101*  BUN 5* 6 4* 3* <3*  CREATININE 0.37* 0.51 0.51 0.59 0.55  CALCIUM 5.0* 8.3* 7.7* 7.9* 7.7*  MG  --  1.8  --  2.0  --   AST  --  _1 ALT  --  _2 ALKPHOS  --  61 53 49 55  BILITOT  --  0.5 0.3 0.3 0.3   ------------------------------------------------------------------------------------------------------------------ estimated creatinine clearance is 84.7 ml/min (by C-G formula based on Cr of 0.55). ------------------------------------------------------------------------------------------------------------------ No results found for this basename: HGBA1C,  in the last 72  hours ------------------------------------------------------------------------------------------------------------------ No results found for this basename: CHOL, HDL, LDLCALC, TRIG, CHOLHDL, LDLDIRECT,  in the last 72 hours ------------------------------------------------------------------------------------------------------------------ No results found for this basename: TSH, T4TOTAL, FREET3, T3FREE, THYROIDAB,  in the last 72 hours ------------------------------------------------------------------------------------------------------------------ No results found for this basename: VITAMINB12, FOLATE, FERRITIN, TIBC, IRON, RETICCTPCT,  in the last 72 hours  Coagulation profile No results found for this basename: INR, PROTIME,  in the last 168 hours  No results found for this basename: DDIMER,  in the last 72 hours  Cardiac Enzymes No results found for this basename: CK, CKMB, TROPONINI, MYOGLOBIN,  in the last 168 hours ------------------------------------------------------------------------------------------------------------------ No components found with this basename: POCBNP,      Time Spent in minutes  35   SINGH,PRASHANT K M.D on 07/15/2013 at 11:12 AM  Between 7am to 7pm - Pager - [phone]  After 7pm go to [email or web address] - password [email or web address]  And look for the night coverage person covering for me after hours  [facility] Office  [phone]

## 2013-07-15 NOTE — Progress Notes (Signed)
Discussed with NP  NO CRUSHED MEDS VIA J TUBE - ONLY LIQUIDS  Call with ?  Mary SellaEric M. Andrey CampanileWilson, MD, FACS General, Bariatric, & Minimally Invasive Surgery [facility] [place]

## 2013-07-15 NOTE — Progress Notes (Signed)
Speech Language Pathology Treatment: Dysphagia  Patient Details Name: Joan [name] MRN: [id number] DOB: 10/24/1960 Today's Date: 07/15/2013 Time: [id number] SLP Time Calculation (min): 14 min  Assessment / Plan / Recommendation Clinical Impression  Per clarification from MD, pt is not medically able to initiate PO diet, however goal of SLP involvement is to determine if oropharyngeal swallow is adequate for PO intake when ready. Per MD request, SLP provided trials of Dys 1 textures and thin liquids with no overt s/s of aspiration observed, although pt did present with a mild cognitively-based dysphagia characterized by holding, prolonged oral transit, and decreased bolus propulsion requiring cues to clear. Pt's aspiration risk appears minimal at this time with consistencies tested, and can be further reduced by full supervision to cue for oral holding and residuals.   Given that pt is not medically ready for a PO diet, SLP will sign off at this time. Please consider re-ordering SLP services for diet tolerance when pt is able to initiate PO intake.   HPI HPI: 53 yr old female presents with a CC of Altered mental status. Pt has a PMH of peripheral neuropathy, seizures, catatonia, anxiety, HTN, and fibromyalgia. Pt was previously admitted on 12/13 thru 12/28 with toxic metabolic encephalopathy. 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 EEG showed no recurrent seizures. A 24 hour EEG showed an intermittent delta pattern felt to represent Frontal Intermittent Rhythmic Delta Activity. Pt was started on two antiepileptics. This admission, she was again found altered and seemed "comatose" . Since d/c in December, pt has had persistent confusion and [facility] She is currently confused and experiencing visual hallucinations.She is s/p J tube placement and followed with intractable nausea, vomiting. Surgery following, . ON 2/3  night pt developed fever of 101.4, became tachycardic and hypotensive. CXR, done , UA ordered and blood cultures negative so far. Her work up so far negative for infection. H&H remains stable at 8.5 , and she was found to have mild leukocytosis. Patient subsequently on 07/10/2013 was seen, she was found to be persistently hypotensive, tachycardic, febrile, despite 3 L of IV fluids her blood pressure stayed around 80 systolic, pulmonary critical care was consulted and patient was transferred to step down unit. Possible sources of infection include line infection vs. urological source, less likely aspiration pna. Per chart pts J-Tube not working. Question if pt could take oral meds. Upper Gi on 1/29 showed prominent gastro esophageal reflux with small stomach. BSE on 12/18 showed no evidence of aspiration, pt on Dys 1/thin liquids.    Pertinent Vitals N/A  SLP Plan  Discharge SLP treatment due to (comment) (medically unable to initiate PO diet at this time)    Recommendations Diet recommendations: NPO;Other(comment) (Dys 1 textures and thin liquids when able to start PO) Liquids provided via: Cup;Straw Medication Administration: Whole meds with puree (crushed if pocketing) Supervision: Staff to assist with self feeding;Full supervision/cueing for compensatory strategies Compensations: Slow rate;Small sips/bites;Check for pocketing Postural Changes and/or Swallow Maneuvers: Seated upright 90 degrees;Upright 30-60 min after meal              Oral Care Recommendations: Oral care Q4 per protocol Follow up Recommendations: Skilled Nursing facility Plan: Discharge SLP treatment due to (comment) (medically unable to initiate PO diet at this time)    GO     Maxcine HamLaura [name], M.A. CCC-SLP [phone]  Maxcine Hamaiewonsky, [name] 07/15/2013, 11:10 AM

## 2013-07-15 NOTE — Progress Notes (Signed)
13 Days Post-Op  Subjective: Tolerating TF.  Seems a bit more alert today.    Objective: Vital signs in last 24 hours: Temp:  [98 F (36.7 C)-99.2 F (37.3 C)] 98 F (36.7 C) (02/15 2026) Pulse Rate:  [104-105] 105 (02/15 2026) Resp:  [18-20] 18 (02/15 2026) BP: (125-140)/(66-67) 140/67 mmHg (02/15 2026) SpO2:  [95 %-99 %] 99 % (02/15 2026) Weight:  [82.6 kg (182 lb 1.6 oz)] 82.6 kg (182 lb 1.6 oz) (02/15 2026) Last BM Date: 07/15/13  Intake/Output from previous day: 02/15 0701 - 02/16 0700 In: 1084.3 [I.V.:344.3; NG/GT:180; IV Piggyback:300] Out: -  Intake/Output this shift:   PE  GI: soft, non-tender; bowel sounds normal; no masses, no organomegaly midline incision is healing well, steri-strips are intact. J tube in place TF at goal.   Lab Results:   Recent Labs  07/13/13 0500 07/14/13 0415  WBC 5.0 4.7  HGB 10.7* 10.3*  HCT 31.5* 30.2*  PLT 153 144*   BMET  Recent Labs  07/14/13 0415 07/15/13 0500  NA 146 143  K 3.1* 2.5*  CL 112 109  CO2 26 24  GLUCOSE 81 101*  BUN 3* <3*  CREATININE 0.59 0.55  CALCIUM 7.9* 7.7*   PT/INR No results found for this basename: LABPROT, INR,  in the last 72 hours ABG No results found for this basename: PHART, PCO2, PO2, HCO3,  in the last 72 hours  Studies/Results: Ir Replc Duoden/jejuno Tube Percut W/fluoro  07/13/2013   CLINICAL DATA:  Occluded surgical jejunostomy requiring exchange.  EXAM: JEJUNOSTOMY TUBE EXCHANGE  CONTRAST:  10 ml Omnipaque-300  FLUOROSCOPY TIME:  1 min and 24 seconds.  PROCEDURE: Informed consent was obtained by telephone from the patient's son.  The pre-existing jejunostomy and surrounding skin were prepped with Betadine. Attempt was made to inject contrast via the pre-existing tube. The tube was cut and removed over a hydrophilic guidewire. A new 12 French red rubber jejunostomy catheter was advanced over the wire.  Final catheter position was confirmed with a fluoroscopic spot image obtained after  injection of contrast. The catheter was secured at the exit site with a silk retention suture.  COMPLICATIONS: None.  FINDINGS: The pre-existing 210 French tube is occluded. A new 12 French tube was placed and advanced into the jejunum.  IMPRESSION: Replacement of occluded 10 French jejunostomy catheter. The tube was up sized to a 12 [place] catheter advanced into the jejunum over a guidewire.   Electronically Signed   By: Irish LackGlenn  Yamagata M.D.   On: 07/13/2013 15:54    Anti-infectives: Anti-infectives   Start     Dose/Rate Route Frequency Ordered Stop   07/11/13 1230  ciprofloxacin (CIPRO) IVPB 400 mg     400 mg 200 mL/hr over 60 Minutes Intravenous Every 12 hours 07/11/13 1151     07/10/13 2100  vancomycin (VANCOCIN) IVPB 750 mg/150 ml premix  Status:  Discontinued     750 mg 150 mL/hr over 60 Minutes Intravenous Every 8 hours 07/10/13 1215 07/11/13 1103   07/10/13 1400  aztreonam (AZACTAM) 1 g in dextrose 5 % 50 mL IVPB  Status:  Discontinued     1 g 100 mL/hr over 30 Minutes Intravenous 3 times per day 07/10/13 1215 07/13/13 1111   07/10/13 1230  vancomycin (VANCOCIN) IVPB 1000 mg/200 mL premix     1,000 mg 200 mL/hr over 60 Minutes Intravenous  Once 07/10/13 1215 07/10/13 1422   07/02/13 1330  ciprofloxacin (CIPRO) IVPB 400 mg  400 mg 200 mL/hr over 60 Minutes Intravenous To Surgery 07/02/13 1321 07/02/13 1326   06/23/13 1400  piperacillin-tazobactam (ZOSYN) IVPB 3.375 g  Status:  Discontinued     3.375 g 12.5 mL/hr over 240 Minutes Intravenous 3 times per day 06/23/13 1336 06/26/13 1441   06/20/13 1200  vancomycin (VANCOCIN) IVPB 1000 mg/200 mL premix     1,000 mg 200 mL/hr over 60 Minutes Intravenous  Once 06/20/13 1146 06/20/13 1433      Assessment/Plan: s/p Procedure(s):  EXPLORATORY LAPAROTOMY with revision of jejunostomy feeding tube. (N/A)  SMALL BOWEL RESECTION (N/A)  S/p J tube revision  Ir successfully exchanged the J tube 2/14 She is tolerating tube feeds Free  water flushes CRUSHED MEDICATION IS NOT ALLOWED, ONLY LIQUID MEDICATION MAY BE GIVEN PER TUBE Surgery is signing off.  Please call with questions or concerns.     LOS: 26 days    Lestat Golob ANP-BC 07/15/2013  10:06 AM

## 2013-07-15 NOTE — Progress Notes (Signed)
NUTRITION CONSULT / FOLLOW-UP  INTERVENTION:  Continue Vital AF 1.2 at goal rate of 60 ml/hr to provide 1728 kcal, 108 grams protein, 1168 ml free water. This is the most appropriate formula choice to help manage symptoms of GI intolerance.  Continue free water of 140 ml free water QID to provide an additional 560 ml free water daily; for a total of 1728 free water daily.  NUTRITION DIAGNOSIS: Inadequate oral intake related to AMS as evidenced by NPO status.  Ongoing.  Goal: Intake to meet >90% of estimated nutrition needs. Unmet.  Monitor:  Weight trend, labs, TF tolerance and adequacy  ASSESSMENT: PMHx significant for catatonia, HTN, anxiety, gastric bypass. From SNF.  Recent admission from 12/13 - 12/28 with toxic metabolic encephalopathy, hospital course was complicated with pyelonephritis and C. Difficile colitis. Pt was scheduled for PEG on 1/20, was found to be agitated and confused and was sent to ED.   Pt developed frequent amount of large stooling from vagina on 1/25 and was sent to SDU. Transferred back to Med-Surge floor on 2/9.  1/27 - Underwent open J-tube placement 1/28 - Osmolite 1.5 at 10 ml/hr initiated; pt developed vomiting after feedings were started and anti-emetics were not effective. 1/29 - Feeds were re-attempted overnight.  1/30 - NGT placed to LWIS; 250 ml output; Osmolite 1.5 resumed at 20 ml/hr 1/31 - NGT in place to LWIS; 1000 ml output; Osmolite 1.5 at 20 ml/hr 2/1 - NGT in place to LWIS; 950 ml output; pt pulled out NGT later in evening; Osmolite 1.5 at 20 ml/hr 2/2 - NGT replaced to LWIS; 300 ml output; Osmolite 1.5 at 20 ml/hr 2/3 - NGT in place to LWIS; 100 ml/hr output per RN; Osmolite 1.5 on hold 2/2 pt to surgery; J-tube revision in OR with small bowel resection 2[facility]4 [facility] per surgery do not use J-tube yet 2/5 - NGT d/c'd; trickle TF of Osmolite 1.2 at 20 ml/hr initiated per surgery 2/6 - TF of Osmolite 1.2 at 20 ml/hr - RD with  orders to increase feedings; RN reports tolerating well 2/7- RD re-consulted due to diarrhea. Change TF from Osmolite to Jevity.  2/9 - Continues with persistent diarrhea, at least 600 ml/day. Discussed with Dr. Janee Mornhompson re: changing to elemental formula, he is in agreement.  Change TF from Jevity to Vital AF 1.2. 2/10 - Diarrhea improved. C-diff negative; rotavirus positive. Pt achieved goal rate of Vital AF 1.2 at 60 ml/hr. 2/11 - Pt developed vomiting. TF held. Pt febrile. Abdominal film with mild ileus pattern. Pt with possible UTI.  2/12 - Per surgery, not obstruction and resume feedings. R IJ catheter removed 2/2 concern for infection.  Potassium low at 2.5.  Per SLP note, patient is not medically able to initiate a PO diet. Remains NPO at this time.  Received consult due to patient with profuse diarrhea with Vital TF. Patient with ongoing diarrhea since admission. Seems to have improved over the past few days. RN reports that she has had a small amount of stool output today, nothing excessive. Rectal pouch in place. Actual stool volume has not been recorded for the past few days.   TF formula was changed to Vital AF 1.2 on 2/12 to help control diarrhea. Vital AF 1.2 is a semi-elemental formula designed to help manage inflammation and symptoms of GI intolerance. Patient seems to be tolerating TF well at this time.  Height: Ht Readings from Last 1 Encounters:  07/06/13 5\' 3"  (1.6  m)    Weight: Wt Readings from Last 1 Encounters:  07/14/13 182 lb 1.6 oz (82.6 kg)  Admit wt 152 lb  BMI:  Body mass index is 32.27 kg/(m^2).  Obese Class I  Estimated Nutritional Needs: Kcal: 1600 - 1800 Protein: 75 - 90 g Fluid: 1.8 - 2 liters  Skin: abdomen incision  Diet Order: NPO   EDUCATION NEEDS: -No education needs identified at this time   Intake/Output Summary (Last 24 hours) at 07/15/13 1250 Last data filed at 07/15/13 0013  Gross per 24 hour  Intake 256.67 ml  Output      0 ml   Net 256.67 ml    Last BM: 2/16  Labs:   Recent Labs Lab 07/12/13 0500 07/13/13 0500 07/14/13 0415 07/15/13 0500  NA 139 144 146 143  K 4.5 3.2* 3.1* 2.5*  CL 106 111 112 109  CO2 22 24 26 24   BUN 6 4* 3* <3*  CREATININE 0.51 0.51 0.59 0.55  CALCIUM 8.3* 7.7* 7.9* 7.7*  MG 1.8  --  2.0  --   GLUCOSE 95 89 81 101*    CBG (last 3)   Recent Labs  07/15/13 0358 07/15/13 0757 07/15/13 1138  GLUCAP 91 99 95   Prealbumin  Date/Time Value Ref Range Status  06/24/2013  4:30 PM 11.7* 17.0 - 34.0 mg/dL Final     Performed at [facility]    Scheduled Meds: . chlorhexidine  15 mL Mouth Rinse BID  . ciprofloxacin  400 mg Intravenous Q12H  . cloNIDine  0.1 mg Transdermal Weekly  . enoxaparin (LOVENOX) injection  40 mg Subcutaneous Q24H  . ferric gluconate (FERRLECIT/NULECIT) IV  125 mg Intravenous Daily  . free water  140 mL Per Tube QID  . levETIRAcetam  1,000 mg Oral BID  . metoprolol tartrate  100 mg Oral BID  . mirtazapine  15 mg Oral QHS  . pantoprazole sodium  40 mg Per Tube Q1200  . potassium chloride  10 mEq Intravenous Q1 Hr x 6  . potassium chloride  40 mEq Oral Q6H  . potassium chloride  40 mEq Oral BID  . risperiDONE  0.5 mg Oral BID  . sodium chloride  10 mL Intravenous Q12H  . Valproic Acid  500 mg Oral TID    Continuous Infusions: . citrate dextrose    . dextrose 20 mL/hr at 07/14/13 0800  . feeding supplement (VITAL AF 1.2 CAL) 1,000 mL (07/14/13 0800)    Joaquin Courts, RD, LDN, CNSC Pager [phone] After Hours Pager [phone]

## 2013-07-15 NOTE — Progress Notes (Signed)
I went to see patient today upon family`s request but no family member present at bedside hence will try again tomorrow.

## 2013-07-16 LAB — GLUCOSE, CAPILLARY
GLUCOSE-CAPILLARY: 102 mg/dL — AB (ref 70–99)
Glucose-Capillary: 87 mg/dL (ref 70–99)

## 2013-07-16 LAB — MAGNESIUM: Magnesium: 1.9 mg/dL (ref 1.5–2.5)

## 2013-07-16 LAB — MISCELLANEOUS TEST

## 2013-07-16 LAB — POTASSIUM: POTASSIUM: 3.8 meq/L (ref 3.7–5.3)

## 2013-07-16 MED ORDER — ONDANSETRON HCL 4 MG PO TABS: 4.0000 mg | ORAL_TABLET | Freq: Three times a day (TID) | ORAL | Status: DC | PRN

## 2013-07-16 MED ORDER — RISPERIDONE 0.5 MG PO TBDP: 0.5000 mg | ORAL_TABLET | Freq: Two times a day (BID) | ORAL | Status: DC

## 2013-07-16 MED ORDER — VALPROIC ACID 250 MG PO CAPS: 500.0000 mg | ORAL_CAPSULE | Freq: Three times a day (TID) | ORAL | Status: DC

## 2013-07-16 MED ORDER — POTASSIUM CHLORIDE 20 MEQ/15ML (10%) PO LIQD: 40.0000 meq | Freq: Two times a day (BID) | ORAL | Status: DC

## 2013-07-16 MED ORDER — PANTOPRAZOLE SODIUM 40 MG PO PACK: 40.0000 mg | PACK | Freq: Every day | ORAL | Status: DC

## 2013-07-16 MED ORDER — FREE WATER: 140.0000 mL | Freq: Four times a day (QID) | Status: DC

## 2013-07-16 MED ORDER — VITAL AF 1.2 CAL PO LIQD: 1000.0000 mL | ORAL | Status: DC

## 2013-07-16 MED ORDER — FERROUS SULFATE 325 (65 FE) MG PO TABS: 325.0000 mg | ORAL_TABLET | Freq: Two times a day (BID) | ORAL | Status: DC

## 2013-07-16 MED ORDER — ATENOLOL 100 MG PO TABS: 100.0000 mg | ORAL_TABLET | Freq: Two times a day (BID) | ORAL | Status: DC

## 2013-07-16 MED ORDER — HEPARIN SOD (PORK) LOCK FLUSH 100 UNIT/ML IV SOLN
250.0000 [IU] | INTRAVENOUS | Status: DC | PRN
  Administered 2013-07-16: 15:00:00

## 2013-07-16 MED ORDER — CIPROFLOXACIN IN D5W 400 MG/200ML IV SOLN: 400.0000 mg | Freq: Two times a day (BID) | INTRAVENOUS | Status: DC

## 2013-07-16 NOTE — Discharge Instructions (Signed)
Follow with Primary MD Karlene EinsteinASANAYAKA,[name], MD in 2 days   Get CBC, CMP, checked 2 days by Primary MD and again as instructed by your Primary MD. Get a 2 view Chest X ray done next visit.   Activity: As tolerated with Full fall precautions use walker/cane & assistance as needed   Disposition SNF   Diet: Tube feeds Via J tube, Meds only by mouth   For Heart failure patients - Check your Weight same time everyday, if you gain over 2 pounds, or you develop in leg swelling, experience more shortness of breath or chest pain, call your Primary MD immediately. Follow Cardiac Low Salt Diet and 1.8 lit/day fluid restriction.   On your next visit with her primary care physician please Get Medicines reviewed and adjusted.  Please request your Prim.MD to go over all Hospital Tests and Procedure/Radiological results at the follow up, please get all Hospital records sent to your Prim MD by signing hospital release before you go home.   If you experience worsening of your admission symptoms, develop shortness of breath, life threatening emergency, suicidal or homicidal thoughts you must seek medical attention immediately by calling 911 or calling your MD immediately  if symptoms less severe.  You Must read complete instructions/literature along with all the possible adverse reactions/side effects for all the Medicines you take and that have been prescribed to you. Take any new Medicines after you have completely understood and accpet all the possible adverse reactions/side effects.   Do not drive and provide baby sitting services if your were admitted for syncope or siezures until you have seen by Primary MD or a Neurologist and advised to do so again.  Do not drive when taking Pain medications.    Do not take more than prescribed Pain, Sleep and Anxiety Medications  Special Instructions: If you have smoked or chewed Tobacco  in the last 2 yrs please stop smoking, stop any regular Alcohol  and or any  Recreational drug use.  Wear Seat belts while driving.   Please note  You were cared for by a hospitalist during your hospital stay. If you have any questions about your discharge medications or the care you received while you were in the hospital after you are discharged, you can call the unit and asked to speak with the hospitalist on call if the hospitalist that took care of you is not available. Once you are discharged, your primary care physician will handle any further medical issues. Please note that NO REFILLS for any discharge medications will be authorized once you are discharged, as it is imperative that you return to your primary care physician (or establish a relationship with a primary care physician if you do not have one) for your aftercare needs so that they can reassess your need for medications and monitor your lab values.

## 2013-07-16 NOTE — Discharge Summary (Addendum)
Joan [name], is a 53 y.o. female  DOB 08-Feb-1961  MRN [id number].  Admission date:  06/19/2013  Admitting Physician  Charlynne Cousins, MD  Discharge Date:  07/16/2013   Primary MD  Ezequiel Kayser, MD  Recommendations for primary care physician for things to follow:   Follow CBC BMP, please make sure patient follows with her gastric bypass surgeon, ID, GYN physician in a timely fashion.   Admission Diagnosis  Encephalitis malnutrition Bowel obstruction   Discharge Diagnosis   Encephalitis of unclear etiology Moderate PCM J-tube malfunction requiring revision surgery  Klebsiella UTI with bacteremia    Principal Problem:   Acute encephalopathy Active Problems:   Convulsions/seizures   Toxic metabolic encephalopathy   Psychosis   Nausea with vomiting   Malfunction of jejunostomy tube   S/P small bowel resection      Past Medical History  Diagnosis Date  . Peripheral neuropathy   . Fibromyalgia   . Hypertension   . Anxiety   . Catatonia   . Seizures     Past Surgical History  Procedure Laterality Date  . Gastric bypass    . Abdominal hysterectomy    . Jejunostomy N/A 06/25/2013    Procedure:  OPEN JEJUNOSTOMY FEEDING TUBE ;  Surgeon: Gwenyth Ober, MD;  Location: [facility];  Service: General;  Laterality: N/A;  . Laparotomy N/A 07/02/2013    Procedure: EXPLORATORY LAPAROTOMY with revision of jejunostomy feeding tube.;  Surgeon: Gwenyth Ober, MD;  Location: [facility];  Service: General;  Laterality: N/A;  . Bowel resection N/A 07/02/2013    Procedure: SMALL BOWEL RESECTION;  Surgeon: Gwenyth Ober, MD;  Location: [facility];  Service: General;  Laterality: N/A;     Discharge Condition: stable   Follow UP  Follow-up Information   Follow up with [name],[name], MD. Schedule an appointment as soon as possible for a  visit in 2 days.   Specialty:  Internal Medicine   Contact information:   [address] [place] [address] [phone]       Follow up with [name],MD, MD. Schedule an appointment as soon as possible for a visit in 1 week. (and your Gastric bypass surgeon)    Specialty:  General Surgery   Contact information:   [place] [address] [phone]       Follow up with [name], MD. Schedule an appointment as soon as possible for a visit in 1 week.   Specialties:  Neurology, Radiology   Contact information:   [address] [place] [address] [phone]       Follow up with [name], MD. Schedule an appointment as soon as possible for a visit in 1 week.   Specialty:  Infectious Diseases   Contact information:   [address] [place] [address] [phone]         Discharge Instructions  and  Discharge Medications          Discharge Orders  Future Appointments Provider Department Dept Phone   08/23/2013 4:00 PM [name], NP [facility] [phone]   Future Orders Complete By Expires   Discharge instructions  As directed    Comments:     Follow with Primary MD Ezequiel Kayser, MD in 2 days   Get CBC, CMP, checked 2 days by Primary MD and again as instructed by your Primary MD. Get a 2 view Chest X ray done next visit.   Activity: As tolerated with Full fall precautions use walker/cane & assistance as needed   Disposition SNF   Diet: Tube feeds Via J tube, Meds only by mouth   For Heart failure patients - Check your Weight same time everyday, if you gain over 2 pounds, or you develop in leg swelling, experience more shortness of breath or chest pain, call your Primary MD immediately. Follow Cardiac Low Salt Diet and 1.8 lit/day fluid restriction.   On your next visit with her primary care physician please Get Medicines reviewed and adjusted.  Please  request your Prim.MD to go over all Hospital Tests and Procedure/Radiological results at the follow up, please get all Hospital records sent to your Prim MD by signing hospital release before you go home.   If you experience worsening of your admission symptoms, develop shortness of breath, life threatening emergency, suicidal or homicidal thoughts you must seek medical attention immediately by calling 911 or calling your MD immediately  if symptoms less severe.  You Must read complete instructions/literature along with all the possible adverse reactions/side effects for all the Medicines you take and that have been prescribed to you. Take any new Medicines after you have completely understood and accpet all the possible adverse reactions/side effects.   Do not drive and provide baby sitting services if your were admitted for syncope or siezures until you have seen by Primary MD or a Neurologist and advised to do so again.  Do not drive when taking Pain medications.    Do not take more than prescribed Pain, Sleep and Anxiety Medications  Special Instructions: If you have smoked or chewed Tobacco  in the last 2 yrs please stop smoking, stop any regular Alcohol  and or any Recreational drug use.  Wear Seat belts while driving.   Please note  You were cared for by a hospitalist during your hospital stay. If you have any questions about your discharge medications or the care you received while you were in the hospital after you are discharged, you can call the unit and asked to speak with the hospitalist on call if the hospitalist that took care of you is not available. Once you are discharged, your primary care physician will handle any further medical issues. Please note that NO REFILLS for any discharge medications will be authorized once you are discharged, as it is imperative that you return to your primary care physician (or establish a relationship with a primary care physician if you do not  have one) for your aftercare needs so that they can reassess your need for medications and monitor your lab values.   Increase activity slowly  As directed        Medication List    STOP taking these medications       methocarbamol 500 MG tablet  Commonly known as:  ROBAXIN      TAKE these medications       acetaminophen 325 MG tablet  Commonly known as:  TYLENOL  Take 650 mg by mouth every  4 (four) hours as needed for mild pain or fever.     atenolol 100 MG tablet  Commonly known as:  TENORMIN  Take 1 tablet (100 mg total) by mouth 2 (two) times daily.     chlorhexidine 0.12 % solution  Commonly known as:  PERIDEX  Use as directed 15 mLs in the mouth or throat 2 (two) times daily.     ciprofloxacin 400 MG/200ML Soln  Commonly known as:  CIPRO  Inject 200 mLs (400 mg total) into the vein every 12 (twelve) hours. For 10 more days     ELDERTONIC PO  Take 15 mLs by mouth 2 (two) times daily.     ferrous sulfate 325 (65 FE) MG tablet  Take 1 tablet (325 mg total) by mouth 2 (two) times daily with a meal.     folic acid 1 MG tablet  Commonly known as:  FOLVITE  Take 1 mg by mouth daily.     free water Soln  Place 140 mLs into feeding tube 4 (four) times daily.     levETIRAcetam 1000 MG tablet  Commonly known as:  KEPPRA  Take 1,000 mg by mouth 2 (two) times daily.     loperamide 2 MG capsule  Commonly known as:  IMODIUM  Take 2 mg by mouth as needed for diarrhea or loose stools.     megestrol 400 MG/10ML suspension  Commonly known as:  MEGACE  Take 400 mg by mouth daily.     mirtazapine 30 MG disintegrating tablet  Commonly known as:  REMERON SOL-TAB  Take 30 mg by mouth at bedtime.     NUTRITIONAL SHAKE Liqd  Take 1 Can by mouth 3 (three) times daily before meals. Mighty shakes.     feeding supplement (VITAL AF 1.2 CAL) Liqd  Place 1,000 mLs into feeding tube continuous.     ondansetron 4 MG tablet  Commonly known as:  ZOFRAN  Take 1 tablet (4 mg total)  by mouth every 8 (eight) hours as needed for nausea or vomiting.     pantoprazole sodium 40 mg/20 mL Pack  Commonly known as:  PROTONIX  Place 20 mLs (40 mg total) into feeding tube daily at 12 noon.     potassium chloride 20 MEQ/15ML (10%) solution  Take 30 mLs (40 mEq total) by mouth 2 (two) times daily.     risperiDONE 0.5 MG disintegrating tablet  Commonly known as:  RISPERDAL M-TABS  Take 1 tablet (0.5 mg total) by mouth 2 (two) times daily.     thiamine 100 MG tablet  Commonly known as:  VITAMIN B-1  Take 100 mg by mouth daily.     valproic acid 250 MG capsule  Commonly known as:  DEPAKENE  Take 2 capsules (500 mg total) by mouth 3 (three) times daily. Take 52m in the morning, 258min the afternoon, and 50098mt bedtime.          Diet and Activity recommendation: See Discharge Instructions above   Consults obtained -  Gen. surgery, neurology, PC CM, GYN    Major procedures and Radiology Reports - PLEASE review detailed and final reports for all details, in brief -   SIGNIFICANT EVENTS / STUDIES:  1/21- admitted  1/23 - CT Chest . No evidence of malignancy or other significant abnormality of the chest  1/24 - MRI C-spine > No evidence of cervical disc herniation. Minimal degenerative changes upper thoracic spine  1/27 - J tube placed  2/3 - Small  bowel resection and J-tube revision.  2/11 - Abd xray > no bowel obstruction, possible mild ileus  07/05/2013 echocardiogram stable EF of 60%  11/16/2013 Lower extremity venous duplex no DVT    LINES / TUBES:  R Permacath 1/22 >>> removed on 07/10/2013  R PICC 1/20 >>>  Initial placement Jtube 1/27 >>> expiratory lap with the revision of J-tube on 07/02/2013 by general surgery  Flexiseal  J-tube clogg removed by IR on 07/13/2013    Ct Abdomen Pelvis Wo Contrast  07/10/2013   CLINICAL DATA:  Sepsis question intra-abdominal source  EXAM: CT ABDOMEN AND PELVIS WITHOUT CONTRAST  TECHNIQUE: Multidetector CT imaging  of the abdomen and pelvis was performed following the standard protocol without intravenous contrast. Sagittal and coronal MPR images reconstructed from axial data set. GI contrast administered  COMPARISON:  CT pelvis 05/17/2013; CT abdomen and pelvis 06/02/2011  FINDINGS: Small moderate-sized right pleural effusion with basilar atelectasis.  Minimal atelectasis left base.  Gallbladder surgically absent.  Prior gastric surgery.  Jejunostomy tube left mid abdomen, through which GI contrast was administered.  Dilated small bowel loops in the upper abdomen terminating at an anastomotic staple line in the left mid abdomen most compatible with small bowel obstruction ; the lack of dilatation of the remaining small bowel and colon makes ileus much less likely.  Small bowel measures up to 4.8 cm transverse mild bowel wall thickening of the dilated loops.  Distal small bowel is decompressed and unremarkable.  Small foci of gas in the left mid abdomen into the upper left pelvis appear to be within several small bowel loops which are nondistended and no definite free intraperitoneal air is identified.  Decompressed colon containing contrast and a rectal tube.  Unremarkable bladder and ovaries with surgical absence of uterus.  Small amount of free pelvic fluid.  Diffuse soft tissue edema/ anasarca.  No mass, adenopathy, or hernia.  Osseous structures unremarkable.  IMPRESSION: Dilated proximal small bowel loops proximal to the jejunostomy site most consistent with small bowel obstruction, associated with mild bowel wall thickening and duodenal dilatation.  Decompressed distal small bowel and colon.  Small amount of nonspecific free pelvic fluid.   Electronically Signed   By: Lavonia Dana M.D.   On: 07/10/2013 17:38   Dg Abd 1 View  06/27/2013   CLINICAL DATA:  Nausea, abdominal pain, vomiting  EXAM: ABDOMEN - 1 VIEW  COMPARISON:  None.  FINDINGS: There is a surgical drain present in the mid abdomen. There are surgical stated  in the right paramedian abdominal wall. There is a small amount of contrast scattered throughout the colon. There is no bowel dilatation to suggest obstruction. There is no evidence of pneumoperitoneum, portal venous gas or pneumatosis. There are no pathologic calcifications along the expected course of the ureters.The osseous structures are unremarkable.  IMPRESSION: Nonobstructive bowel gas pattern.   Electronically Signed   By: Kathreen Devoid   On: 06/27/2013 09:34   Dg Abd 1 View  06/17/2013   CLINICAL DATA:  NG tube placement.  EXAM: ABDOMEN - 1 VIEW  COMPARISON:  None.  FINDINGS: NG tube tip is in the peripyloric region in the right upper quadrant. Side port is in the mid to distal stomach.  Final image demonstrates injection of contrast filling the distal stomach and proximal duodenum.  IMPRESSION: NG tube tip in the peripyloric region.   Electronically Signed   By: Rolm Baptise M.D.   On: 06/17/2013 11:56   Ct Chest W Contrast  06/21/2013   CLINICAL DATA:  Altered mental status.  Evaluate for malignancy.  EXAM: CT CHEST WITH CONTRAST  TECHNIQUE: Multidetector CT imaging of the chest was performed during intravenous contrast administration.  CONTRAST:  17m OMNIPAQUE IOHEXOL 300 MG/ML  SOLN  COMPARISON:  Chest x-ray dated 05/11/2013  FINDINGS: Heart size and pulmonary vascularity are normal. There is no hilar or mediastinal adenopathy. The lungs are clear. No mass lesions, infiltrates, or effusions. Soft tissues of the chest are normal. Thyroid gland is normal. No acute osseous abnormality. Osteophytes fuse the thoracic spine from T6 through T10.  IMPRESSION: No evidence of malignancy or other significant abnormality of the chest.   Electronically Signed   By: JRozetta NunneryM.D.   On: 06/21/2013 17:13   Ct Angio Chest Pe W/cm &/or Wo Cm  07/11/2013   CLINICAL DATA:  Altered mental status, tachycardia, chest pain and shortness breath  EXAM: CT ANGIOGRAPHY CHEST WITH CONTRAST  TECHNIQUE: Multidetector CT  imaging of the chest was performed using the standard protocol during bolus administration of intravenous contrast. Multiplanar CT image reconstructions and MIPs were obtained to evaluate the vascular anatomy.  CONTRAST:  1010mOMNIPAQUE IOHEXOL 350 MG/ML SOLN  COMPARISON:  DG CHEST 1V PORT dated 07/11/2013; CT ABD/PELV WO CM dated 07/10/2013; CT CHEST W/CM dated 06/21/2013  FINDINGS: There are no filling defects within the pulmonary arteries to suggest acute pulmonary embolism. Acute findings of the aorta or great vessels. No pericardial fluid.  There are bilateral pleural effusions, moderate to large on the right and small on the left. There is associated right lower lobe passive atelectasis.  Limited view of the upper abdomen is unremarkable. Limited view of the skeleton demonstrates degenerative spurring.  Review of the MIP images confirms the above findings.  IMPRESSION: 1. No evidence acute pulmonary embolism. 2. Moderate to large right pleural effusion with associated passive atelectasis. 3. No pericardial fluid.   Electronically Signed   By: StSuzy Bouchard.D.   On: 07/11/2013 17:53   Mr BrJeri CosoVVontrast  06/20/2013   CLINICAL DATA:  Altered mental status. Possible autoimmune encephalitis.  EXAM: MRI HEAD WITHOUT AND WITH CONTRAST  TECHNIQUE: Multiplanar, multiecho pulse sequences of the brain and surrounding structures were obtained without and with intravenous contrast.  CONTRAST:  1575mULTIHANCE GADOBENATE DIMEGLUMINE 529 MG/ML IV SOLN  COMPARISON:  Head CT 06/13/2013 and brain MRI 05/16/2013  FINDINGS: Images are mildly to moderately degraded by motion artifact.  Incidental note is again made of a partially empty sella. No definite residual abnormal T2 or diffusion weighted signal is identified in the thalami. Mild periventricular T2 hyperintensity is unchanged and nonspecific. No new areas of brain parenchymal signal abnormality are identified. There is mild cerebral atrophy. There is no evidence  of acute infarct. There is no evidence of mass, midline shift, or extra-axial fluid collection. Orbits are unremarkable. Major intracranial vascular flow voids are unremarkable. Visualized paranasal sinuses and mastoid air cells are clear. There is no abnormal enhancement.  IMPRESSION: Interval resolution of thalamic signal abnormality. No evidence of new intracranial abnormality.   Electronically Signed   By: AllLogan BoresOn: 06/20/2013 17:26   Mr Cervical Spine Wo Contrast  06/22/2013   CLINICAL DATA:  Right hemiparesis. Possible autoimmune encephalitis.  EXAM: MRI CERVICAL SPINE WITHOUT CONTRAST  TECHNIQUE: Multiplanar, multisequence MR imaging was performed. No intravenous contrast was administered.  COMPARISON:  No comparison cervical spine MR. brain MR 06/20/2013.  FINDINGS: On this motion grade examination,  artifact extends through the cervical cord however, no definitive cervical cord signal abnormality is noted. Cervical medullary junction unremarkable. Intracranial structures as detailed on recent MR of the brain.  Visualized paravertebral structures unremarkable. Both vertebral arteries are patent.  C2-3:  Negative.  C3-4:  Minimal right foraminal narrowing.  C4-5:  Minimal right foraminal narrowing.  C5-6:  Negative.  C6-7:  Negative.  C7-T1:  Negative.  T1-2:  Negative.  T2-3: Minimal Schmorl's node deformity.  Minimal bulge.  T3-4: Minimal bulge.  T4-5: Minimal bulge.  IMPRESSION: No evidence of cervical disc herniation.  Minimal degenerative changes upper thoracic spine.  Please see above.   Electronically Signed   By: Chauncey Cruel M.D.   On: 06/22/2013 19:38   US Transvaginal Non-ob  06/21/2013   CLINICAL DATA:  Altered mental status. Evaluation for possible paraneoplastic syndrome.  EXAM: TRANSABDOMINAL AND TRANSVAGINAL ULTRASOUND OF PELVIS  TECHNIQUE: Both transabdominal and transvaginal ultrasound examinations of the pelvis were performed. Transabdominal technique was performed for global  imaging of the pelvis including uterus, ovaries, adnexal regions, and pelvic cul-de-sac. It was necessary to proceed with endovaginal exam following the transabdominal exam to visualize the ovaries.  COMPARISON:  None  FINDINGS: Uterus  Removed.  Right ovary  Not visualized.  Left ovary  Not visualized.  Other findings  No free fluid.  IMPRESSION: The ovaries could not be visualized on transvaginal or transabdominal pelvic ultrasound. However, review of the CT scan of the pelvis dated 05/17/2013 does demonstrate that both ovaries appear normal, measuring 23 mm on the right and 16 mm on the left.   Electronically Signed   By: Rozetta Nunnery M.D.   On: 06/21/2013 16:45   US Pelvis Complete  06/21/2013   CLINICAL DATA:  Altered mental status. Evaluation for possible paraneoplastic syndrome.  EXAM: TRANSABDOMINAL AND TRANSVAGINAL ULTRASOUND OF PELVIS  TECHNIQUE: Both transabdominal and transvaginal ultrasound examinations of the pelvis were performed. Transabdominal technique was performed for global imaging of the pelvis including uterus, ovaries, adnexal regions, and pelvic cul-de-sac. It was necessary to proceed with endovaginal exam following the transabdominal exam to visualize the ovaries.  COMPARISON:  None  FINDINGS: Uterus  Removed.  Right ovary  Not visualized.  Left ovary  Not visualized.  Other findings  No free fluid.  IMPRESSION: The ovaries could not be visualized on transvaginal or transabdominal pelvic ultrasound. However, review of the CT scan of the pelvis dated 05/17/2013 does demonstrate that both ovaries appear normal, measuring 23 mm on the right and 16 mm on the left.   Electronically Signed   By: Rozetta Nunnery M.D.   On: 06/21/2013 16:45   Ir Fluoro Guide Cv Line Right  06/20/2013   CLINICAL DATA:  Encephalitis and need for tunneled catheter for plasmapheresis.  EXAM: TUNNELED CENTRAL VENOUS CATHETER PLACEMENT WITH ULTRASOUND AND FLUOROSCOPIC GUIDANCE  MEDICATIONS: 1 g IV vancomycin.  Vancomycin was given within two hours of incision. Vancomycin was given due to an antibiotic allergy.  ANESTHESIA/SEDATION: 2.0 mg IV Versed; 100 mcg IV Fentanyl.  Total Moderate Sedation Time  Twenty minutes.  FLUOROSCOPY TIME:  42 seconds.  PROCEDURE: The procedure, risks, benefits, and alternatives were explained to the patient. Questions regarding the procedure were encouraged and answered. The patient understands and consents to the procedure.  The right neck and chest were prepped with chlorhexidine in a sterile fashion, and a sterile drape was applied covering the operative field. Maximum barrier sterile technique with sterile gowns and gloves were used for the  procedure. Local anesthesia was provided with 1% lidocaine.  Ultrasound was used to confirm patency of the right internal jugular vein. After creating a small venotomy incision, a 21 gauge needle was advanced into the right internal jugular vein under direct, real-time ultrasound guidance. Ultrasound image documentation was performed. After securing guidewire access, an 8 Fr dilator was placed. A J-wire was kinked to measure appropriate catheter length.  A Bard Equistream tunneled hemodialysis/pheresis catheter measuring 19 cm from tip to cuff was chosen for placement. This was tunneled in a retrograde fashion from the chest wall to the venotomy incision.  At the venotomy, serial dilatation was performed and a 16 Fr peel-away sheath was placed over a guidewire. The catheter was then placed through the sheath and the sheath removed. Final catheter positioning was confirmed and documented with a fluoroscopic spot image. The catheter was aspirated, flushed with saline, and injected with appropriate volume heparin dwells.  The venotomy incision was closed with subcutaneous 3-0 Monocryl and subcuticular 4-0 Vicryl. Dermabond was applied to the incision. The catheter exit site was secured with 0-Prolene retention sutures.  COMPLICATIONS: None.  No  pneumothorax.  FINDINGS: After catheter placement, the tips lie in the right atrium. The catheter aspirates normally and is ready for immediate use.  IMPRESSION: Placement of tunneled pheresis catheter via the right internal jugular vein. The catheter tips lie in the right atrium. The catheter is ready for immediate use.   Electronically Signed   By: Aletta Edouard M.D.   On: 06/20/2013 14:27   Ir Fluoro Guide Cv Line Right  06/18/2013   EXAM: ULTRASOUND AND FLUOROSCOPIC GUIDED PICC LINE INSERTION  MEDICATIONS: None.  TECHNIQUE: The procedure, risks, benefits, and alternatives were explained to the patient's son and informed written consent was obtained. A timeout was performed prior to the initiation of the procedure.  The right upper extremity was prepped with chlorhexidine in a sterile fashion, and a sterile drape was applied covering the operative field. Maximum barrier sterile technique with sterile gowns and gloves were used for the procedure. A timeout was performed prior to the initiation of the procedure. Local anesthesia was provided with 1% lidocaine.  Under direct ultrasound guidance, the right brachial vein was accessed with a micropuncture kit after the overlying soft tissues were anesthetized with 1% lidocaine. An ultrasound image was saved for documentation purposes. A guidewire was advanced to the level of the superior caval-atrial junction for measurement purposes and the PICC line was cut to length. A peel-away sheath was placed and a 36 cm, 5 [place], dual lumen was inserted to level of the superior caval-atrial junction. A post procedure spot fluoroscopic was obtained. The catheter easily aspirated and flushed and was sutured in place. A dressing was placed. The patient tolerated the procedure well without immediate post procedural complication.  CONTRAST:  None  FLUOROSCOPY TIME:  36 seconds.  COMPLICATIONS: None immediate  FINDINGS: After catheter placement, the tip lies within the superior  cavoatrial junction. The catheter aspirates and flushes normally and is ready for immediate use.  IMPRESSION: Successful ultrasound and fluoroscopic guided placement of a right brachial vein approach, 36 cm, 5 French, dual lumen PICC with tip at the superior caval-atrial junction. The PICC line is ready for immediate use.  INDICATION: Poor venous access, in need of intravenous access for blood draws and medication administration   Electronically Signed   By: Sandi Mariscal M.D.   On: 06/18/2013 15:51   Ir Removal Tun Cv Cath W/o Fl  07/11/2013  CLINICAL DATA:  Sepsis, concern for right internal jugular tunneled central catheter infection, finished plasmapheresis treatment, request for removal.  EXAM: REMOVAL OF TUNNELED CENTRAL VENOUS CATHETER  PROCEDURE: Risks and benefits of procedure discussed with the patient's son over the phone, verbal consent was obtained.  The right chest tunneled central catheter site was prepped with chlorhexidine. A sterile gown and gloves were worn during the procedure.  Utilizing manual manipulation, the subcutaneous cuff of the central catheter was freed. The catheter was then successfully removed in its entirety. A sterile dressing was applied over the catheter exit site.  IMPRESSION: Removal of tunneled right internal jugular central catheter utilizing manual manipulation. The procedure was uncomplicated.  Read By:  Tsosie Billing PA-C   Electronically Signed   By: Jacqulynn Cadet M.D.   On: 07/11/2013 11:39   Ir Replc Duoden/jejuno Tube Percut W/fluoro  07/13/2013   CLINICAL DATA:  Occluded surgical jejunostomy requiring exchange.  EXAM: JEJUNOSTOMY TUBE EXCHANGE  CONTRAST:  10 ml Omnipaque-300  FLUOROSCOPY TIME:  1 min and 24 seconds.  PROCEDURE: Informed consent was obtained by telephone from the patient's son.  The pre-existing jejunostomy and surrounding skin were prepped with Betadine. Attempt was made to inject contrast via the pre-existing tube. The tube was cut and  removed over a hydrophilic guidewire. A new 12 French red rubber jejunostomy catheter was advanced over the wire.  Final catheter position was confirmed with a fluoroscopic spot image obtained after injection of contrast. The catheter was secured at the exit site with a silk retention suture.  COMPLICATIONS: None.  FINDINGS: The pre-existing 6 French tube is occluded. A new 12 French tube was placed and advanced into the jejunum.  IMPRESSION: Replacement of occluded 10 French jejunostomy catheter. The tube was up sized to a 12 [place] catheter advanced into the jejunum over a guidewire.   Electronically Signed   By: Aletta Edouard M.D.   On: 07/13/2013 15:54   Ir US Guide Vasc Access Right  06/20/2013   CLINICAL DATA:  Encephalitis and need for tunneled catheter for plasmapheresis.  EXAM: TUNNELED CENTRAL VENOUS CATHETER PLACEMENT WITH ULTRASOUND AND FLUOROSCOPIC GUIDANCE  MEDICATIONS: 1 g IV vancomycin. Vancomycin was given within two hours of incision. Vancomycin was given due to an antibiotic allergy.  ANESTHESIA/SEDATION: 2.0 mg IV Versed; 100 mcg IV Fentanyl.  Total Moderate Sedation Time  Twenty minutes.  FLUOROSCOPY TIME:  42 seconds.  PROCEDURE: The procedure, risks, benefits, and alternatives were explained to the patient. Questions regarding the procedure were encouraged and answered. The patient understands and consents to the procedure.  The right neck and chest were prepped with chlorhexidine in a sterile fashion, and a sterile drape was applied covering the operative field. Maximum barrier sterile technique with sterile gowns and gloves were used for the procedure. Local anesthesia was provided with 1% lidocaine.  Ultrasound was used to confirm patency of the right internal jugular vein. After creating a small venotomy incision, a 21 gauge needle was advanced into the right internal jugular vein under direct, real-time ultrasound guidance. Ultrasound image documentation was performed. After  securing guidewire access, an 8 Fr dilator was placed. A J-wire was kinked to measure appropriate catheter length.  A Bard Equistream tunneled hemodialysis/pheresis catheter measuring 19 cm from tip to cuff was chosen for placement. This was tunneled in a retrograde fashion from the chest wall to the venotomy incision.  At the venotomy, serial dilatation was performed and a 16 Fr peel-away sheath was placed over a guidewire.  The catheter was then placed through the sheath and the sheath removed. Final catheter positioning was confirmed and documented with a fluoroscopic spot image. The catheter was aspirated, flushed with saline, and injected with appropriate volume heparin dwells.  The venotomy incision was closed with subcutaneous 3-0 Monocryl and subcuticular 4-0 Vicryl. Dermabond was applied to the incision. The catheter exit site was secured with 0-Prolene retention sutures.  COMPLICATIONS: None.  No pneumothorax.  FINDINGS: After catheter placement, the tips lie in the right atrium. The catheter aspirates normally and is ready for immediate use.  IMPRESSION: Placement of tunneled pheresis catheter via the right internal jugular vein. The catheter tips lie in the right atrium. The catheter is ready for immediate use.   Electronically Signed   By: Aletta Edouard M.D.   On: 06/20/2013 14:27   Ir US Guide Vasc Access Right  06/18/2013   EXAM: ULTRASOUND AND FLUOROSCOPIC GUIDED PICC LINE INSERTION  MEDICATIONS: None.  TECHNIQUE: The procedure, risks, benefits, and alternatives were explained to the patient's son and informed written consent was obtained. A timeout was performed prior to the initiation of the procedure.  The right upper extremity was prepped with chlorhexidine in a sterile fashion, and a sterile drape was applied covering the operative field. Maximum barrier sterile technique with sterile gowns and gloves were used for the procedure. A timeout was performed prior to the initiation of the  procedure. Local anesthesia was provided with 1% lidocaine.  Under direct ultrasound guidance, the right brachial vein was accessed with a micropuncture kit after the overlying soft tissues were anesthetized with 1% lidocaine. An ultrasound image was saved for documentation purposes. A guidewire was advanced to the level of the superior caval-atrial junction for measurement purposes and the PICC line was cut to length. A peel-away sheath was placed and a 36 cm, 5 [place], dual lumen was inserted to level of the superior caval-atrial junction. A post procedure spot fluoroscopic was obtained. The catheter easily aspirated and flushed and was sutured in place. A dressing was placed. The patient tolerated the procedure well without immediate post procedural complication.  CONTRAST:  None  FLUOROSCOPY TIME:  36 seconds.  COMPLICATIONS: None immediate  FINDINGS: After catheter placement, the tip lies within the superior cavoatrial junction. The catheter aspirates and flushes normally and is ready for immediate use.  IMPRESSION: Successful ultrasound and fluoroscopic guided placement of a right brachial vein approach, 36 cm, 5 French, dual lumen PICC with tip at the superior caval-atrial junction. The PICC line is ready for immediate use.  INDICATION: Poor venous access, in need of intravenous access for blood draws and medication administration   Electronically Signed   By: Sandi Mariscal M.D.   On: 06/18/2013 15:51   Dg Chest Port 1 View  07/11/2013   CLINICAL DATA:  Shortness of breath, evaluate pulmonary infiltrates  EXAM: PORTABLE CHEST - 1 VIEW  COMPARISON:  DG CHEST 1V PORT dated 07/10/2013; DG CHEST 1V PORT dated 07/03/2013; CT CHEST W/CM dated 06/21/2013; IR FLUORO GUIDE CV LINE*R* dated 06/20/2013; DG CHEST 1V PORT dated 05/11/2013  FINDINGS: Grossly unchanged cardiac silhouette and mediastinal contours given patient rotation. Stable position of support apparatus. Grossly unchanged right mid lung heterogeneous airspace  opacities with worsening bilateral medial basilar airspace opacities. No definite pleural effusion or pneumothorax. No evidence of edema. A minimal amount of enteric contrast is seen within in the gastric fundus. Post cholecystectomy. Grossly unchanged bones.  IMPRESSION: 1.  Stable positioning of support apparatus.  No  pneumothorax. 2. Worsening bibasilar airspace disease, possibly atelectasis though progression of multifocal infection / aspiration is not excluded. A follow-up chest radiograph in 4 to 6 weeks after treatment is recommended to ensure resolution.   Electronically Signed   By: Sandi Mariscal M.D.   On: 07/11/2013 07:47   Dg Chest Port 1 View  07/10/2013   CLINICAL DATA:  Shortness of breath.  Hypotension  EXAM: PORTABLE CHEST - 1 VIEW  COMPARISON:  DG CHEST 1V PORT dated 07/03/2013; CT CHEST W/CM dated 06/21/2013  FINDINGS: Right internal jugular dialysis catheter tip: Right atrium. Thoracic spondylosis noted. There is a band of airspace opacity in the right upper lobe just above the minor fissure. Improved aeration at the right lung base.  IMPRESSION: Improved aeration at the right lung base, with but there is a band of airspace opacity in the right upper lobe favoring atelectasis over pneumonia. Next item thoracic spondylosis.   Electronically Signed   By: Sherryl Barters M.D.   On: 07/10/2013 08:27   Dg Chest Port 1 View  07/03/2013   CLINICAL DATA:  Tubes and lines in good position, detailed above.  EXAM: PORTABLE CHEST - 1 VIEW  COMPARISON:  Chest CT 06/21/2013  FINDINGS: There is nasogastric tube which terminates near the pylorus. Right IJ catheter, tip in the upper right atrium. Right upper extremity PICC, tip at the upper cavoatrial junction.  Oral contrast noted within the gastric fundus and small bowel.  Haziness of the right base, with volume loss. Node definitive consolidation.  No cardiomegaly.  IMPRESSION: 1. Negative for pneumonia. 2. Haziness of the right lower chest, likely pleural  fluid or atelectasis. 3. Tubes and lines in good position, detailed above.   Electronically Signed   By: Jorje Guild M.D.   On: 07/03/2013 03:35   Dg Abd Portable 1v  07/12/2013   CLINICAL DATA:  Abdominal pain  EXAM: PORTABLE ABDOMEN - 1 VIEW  COMPARISON:  07/11/2013  FINDINGS: Postsurgical changes are again seen. A feeding catheter is noted. Scattered large and small bowel gas is seen. No obstructive changes are noted. No definitive free air is seen.  IMPRESSION: No significant change from the prior exam.   Electronically Signed   By: Inez Catalina M.D.   On: 07/12/2013 07:44   Dg Abd Portable 1v  07/11/2013   CLINICAL DATA:  Abdominal distention, evaluate for small bowel obstruction  EXAM: PORTABLE ABDOMEN - 1 VIEW  COMPARISON:  CT ABD/PELV WO CM dated 07/10/2013; DG ABD PORTABLE 1V dated 07/10/2013; DG ABD PORTABLE 1V dated 06/30/2013  FINDINGS: There has been passage of the majority of previously administered enteric contrast. A minimal amount of enteric contrast remains within the colon. Paucity of bowel gas without definite evidence of obstruction. Nondiagnostic evaluation pneumoperitoneum secondary supine positioning exclusion of lower thorax. No definite pneumatosis or portal venous gas.  A feeding jejunostomy tube overlies the left mid/ lower abdomen.  A vertically aligned skin staples overlie the right mid abdomen. Post cholecystectomy.  Unchanged bones.  IMPRESSION: Paucity of bowel gas, however there has been apparent passage of previously ingested enteric contrast. No definite evidence of obstruction.   Electronically Signed   By: Sandi Mariscal M.D.   On: 07/11/2013 08:26   Dg Abd Portable 1v  07/10/2013   CLINICAL DATA:  Assess for ileus  EXAM: PORTABLE ABDOMEN - 1 VIEW  COMPARISON:  June 30, 2013  FINDINGS: There is persistent air-filled dilated small bowel loops in the left abdomen. Air is noted  in the colon. Skin staples are projected over the right abdomen. Patient is status post prior  cholecystectomy.  IMPRESSION: Persistent ileus.   Electronically Signed   By: Abelardo Diesel M.D.   On: 07/10/2013 02:30   Dg Abd Portable 1v  06/30/2013   CLINICAL DATA:  NG tube placement.  EXAM: PORTABLE ABDOMEN - 1 VIEW  COMPARISON:  Single view of the abdomen 06/27/2013. Upper GI/small bowel follow-through 06/28/2013  FINDINGS: NG tube is in place in good position with the tip in this distal stomach. Contrast material in small bowel from the comparison upper GI series/small bowel follow-through. Small bowel loops are dilated. Only a small amount of loculated barium is seen in the colon.  IMPRESSION: NG tube in good position.  Small bowel obstruction.   Electronically Signed   By: Inge Rise M.D.   On: 06/30/2013 19:01   Dg Addison Bailey G Tube Plc W/fl-no Rad  06/17/2013   CLINICAL DATA: patient to have peg tube placement on January 20th   NASO G TUBE PLACEMENT WITH FLUORO  Fluoroscopy was utilized by the requesting physician.  No radiographic  interpretation.    Dg Ugi W/small Bowel  06/28/2013   CLINICAL DATA:  53 year old female postop 2 days since percutaneous jejunostomy tube placement. Drainage from NG tube. Abdominal pain. Initial encounter.  EXAM: UPPER GI SERIES WITH SMALL BOWEL FOLLOW-THROUGH  FLUOROSCOPIC GUIDED NG TUBE PLACEMENT  TECHNIQUE: Combined double contrast and single contrast upper GI series using effervescent crystals, thick barium, and thin barium. Subsequently, serial images of the small bowel were obtained including spot views of the terminal ileum.  COMPARISON:  KUB 06/27/2013 and earlier.  FLUOROSCOPY TIME:  9 min and 18 seconds.  FINDINGS: Preprocedural scout view of the abdomen demonstrates a small volume of loculated barium in the colon. This is related to the 06/17/2013 NG tube placement/ confirmation study.  Scout view demonstrates the recently placed right an mid lower abdomen percutaneous jejunostomy. Right lower abdomen skin staples in place.  The scout view also  demonstrates the patient's NG tube is looped back up into the esophagus. This was evaluated in real-time with fluoroscopy, and a wire was placed through the tube in an effort to maneuver the tip back into the stomach. However, this was unsuccessful an the tube had be retracted into the nasopharynx. Using fluoroscopic guidance, the tube was then advanced with a wire into the stomach, such that the tip and side hole are within the stomach.  Given the recent abdominal surgery, initially water-soluble contrast was planned, however prominent gastroesophageal reflux of contrast was noted early on, and the decision was made to switch contrast to thin barium. Water-soluble contrast was fully aspirated from the stomach. Subsequently, barium contrast was slowly administered.  The stomach appears small. No gastrojejunostomy is evident. After a short delay, barium emptied the stomach into the proximal duodenum. The second portion of the duodenum is dilated.  After 20 additional min significantly dilated proximal jejunum is opacified, with the small bowel caliber up to 58 mm.  At this point study was discussed with Dr. Judeth Horn . We decided to inject the percutaneous jejunostomy tube, and give the barium more time to opacified a small bowel in hopes of loose to dating the small bowel obstruction transition point.  Barium injection through the jejunostomy tube demonstrates normal small bowel loops at the jejunostomy site. This barium was then aspirated.  Subsequently a 3 hr delayed image was obtained demonstrating a gradual tapering of small bowel loops  into the right lower abdomen, heading toward the vicinity of the jejunostomy. These smaller loops measure 23 mm diameter (arrow).  At this point, Dr. Hulen Skains advised that no additional imaging was needed.  IMPRESSION: 1. High-grade small bowel obstruction. Dilated duodenum and proximal jejunum up to 58 mm diameter. 2. Small bowel loops slowly taper to the right lower abdomen in  the vicinity of the percutaneous jejunostomy. 3. The jejunostomy was injected with contrast, and is situated within normal decompressed small bowel. 4. Diminutive stomach. No gastrojejunostomy is evident. Prominent gastroesophageal reflux. 5. At the beginning of the exam the patient's NG tube was malpositioned. This was withdrawn to the nasopharynx and repositioned into the stomach under fluoroscopic guidance.   Electronically Signed   By: Lars Pinks M.D.   On: 06/28/2013 16:29    Micro Results      Recent Results (from the past 240 hour(s))  CLOSTRIDIUM DIFFICILE BY PCR     Status: None   Collection Time    07/06/13 10:27 AM      Result Value Ref Range Status   C difficile by pcr NEGATIVE  NEGATIVE Final  URINE CULTURE     Status: None   Collection Time    07/10/13  8:15 AM      Result Value Ref Range Status   Specimen Description URINE, CATHETERIZED   Final   Special Requests NONE   Final   Culture  Setup Time     Final   Value: 07/10/2013 09:13     Performed at [facility]     Final   Value: 50,000 COLONIES/ML     Performed at [facility]   Culture     Final   Value: KLEBSIELLA PNEUMONIAE     YEAST     Performed at [facility]   Report Status 07/13/2013 FINAL   Final   Organism ID, Bacteria KLEBSIELLA PNEUMONIAE   Final  CLOSTRIDIUM DIFFICILE BY PCR     Status: None   Collection Time    07/10/13  8:33 AM      Result Value Ref Range Status   C difficile by pcr NEGATIVE  NEGATIVE Final  CULTURE, BLOOD (ROUTINE X 2)     Status: None   Collection Time    07/10/13 10:55 AM      Result Value Ref Range Status   Specimen Description BLOOD LEFT ARM   Final   Special Requests BOTTLES DRAWN AEROBIC ONLY 8CC   Final   Culture  Setup Time     Final   Value: 07/10/2013 16:23     Performed at [facility]   Culture     Final   Value: KLEBSIELLA PNEUMONIAE     Note: Gram Stain Report Called to,Read Back By and Verified With: HOLLY  CHURCH 07/11/13 AT 0330 RIDK     Performed at [facility]   Report Status 07/13/2013 FINAL   Final   Organism ID, Bacteria KLEBSIELLA PNEUMONIAE   Final  MRSA PCR SCREENING     Status: None   Collection Time    07/10/13 12:55 PM      Result Value Ref Range Status   MRSA by PCR NEGATIVE  NEGATIVE Final   Comment:            The GeneXpert MRSA Assay (FDA     approved for NASAL specimens     only), is one component of a  comprehensive MRSA colonization     surveillance program. It is not     intended to diagnose MRSA     infection nor to guide or     monitor treatment for     MRSA infections.  CULTURE, BLOOD (ROUTINE X 2)     Status: None   Collection Time    07/12/13 10:40 AM      Result Value Ref Range Status   Specimen Description BLOOD LEFT FOREARM   Final   Special Requests BOTTLES DRAWN AEROBIC AND ANAEROBIC 10CC   Final   Culture  Setup Time     Final   Value: 07/12/2013 14:02     Performed at [facility]   Culture     Final   Value:        BLOOD CULTURE RECEIVED NO GROWTH TO DATE CULTURE WILL BE HELD FOR 5 DAYS BEFORE ISSUING A FINAL NEGATIVE REPORT     Performed at [facility]   Report Status PENDING   Incomplete  CULTURE, BLOOD (ROUTINE X 2)     Status: None   Collection Time    07/12/13 10:50 AM      Result Value Ref Range Status   Specimen Description BLOOD LEFT ARM   Final   Special Requests BOTTLES DRAWN AEROBIC AND ANAEROBIC 10CC   Final   Culture  Setup Time     Final   Value: 07/12/2013 14:02     Performed at [facility]   Culture     Final   Value:        BLOOD CULTURE RECEIVED NO GROWTH TO DATE CULTURE WILL BE HELD FOR 5 DAYS BEFORE ISSUING A FINAL NEGATIVE REPORT     Performed at [facility]   Report Status PENDING   Incomplete     History of present illness and  Hospital Course:     Kindly see H&P for history of present illness and admission details, please review complete Labs, Consult reports and Test  reports for all details in brief Pierce Biagini, is a 53 y.o. female,presented to the ER from SNF with a CC of Altered mental status and a J-tube placement which was scheduled for 06/18/2013. Pt has a PMH of peripheral neuropathy, seizures, catatonia, anxiety, HTN, and fibromyalgia. Pt was previously admitted on 12/13 thru 12/28 with toxic metabolic encephalopathy. 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 EEG showed no recurrent seizures. A 24 hour EEG showed an intermittent delta pattern felt to represent Frontal Intermittent Rhythmic Delta Activity. Pt was started on two antiepileptics.    This admission, she was again found altered and seemed "comatose" . Since d/c in December, pt has had persistent confusion and is confined to a wheelchair at [facility]. She is currently confused and experiencing visual hallucinations. She is s/p J tube placement on and followed with intractable nausea, vomiting. Surgery the patient and she underwent J-tube revision with on 07/02/13. J-tube subsequently clogged and the clock was removed by IR, J-tube now functioning well patient getting tube feeds via J-tube. She has no further nausea vomiting or abdominal discomfort.    Patient subsequently on 07/10/2013 was seen, she was found to be persistently hypotensive, tachycardic, febrile, despite 3 L of IV fluids her blood pressure stayed around 80 systolic, pulmonary critical care was consulted and patient was transferred to step down unit. She had Klebsiella bacteremia likely from urinary source, her old dialysis catheter was  removed, she has been placed on IV Cipro which she will continue for 10 more days via PICC line. Thereafter if clinically stable IV Cipro should be stopped and PICC line be removed.       Neuro event summary   Patient is awake and alert. She has no complaints. She has had a protracted illness. After significant decline was admitted in December  of last year. At that time was felt to be in status epilepticus. Aborting her seizures completed took high doses of medication and seizure activity was protracted. Patient did not immediately return to baseline and there was felt to be some degree of encephalopathy related to the prolonged seizure activity,. Initial abnormalities noted on MR imaging at that time were showing improvement. Due to concern over the etiology of the seizure activity patient had a full work up that included a LP to rule out encephalitis. All titers returned normal. Patient was showing clinical improvement and was therefore discharged to be followed up on an outpatient basis. Patient did not return to baseline though and was readmitted. Note according to the son she has been bedbound at least for the last 4-5 months.   With readmission serum titers for encephalitic etiologies were obtained and the patient was empirically treated with steroids and Solumedrol. Patient received greater than 5 doses along with PLEX Rx, there has been some clinical improvement in the patient's neurological picture, of note patient has been bedbound for close to 3-4 months according to the son, likely now close to her new baseline which was prior to this admission. She has had no further seizure activity and remains on anticonvulsant therapy. She will follow with her primary neurologist Dr.sethi post discharge.     Other Issues     Iron deficiency anemia. Patient to be oral Iron, will require outpatient GI followup for iron deficiency workup, in the hospital during her initial stay she required 2 units of packed RBC transfusion after which her H&H has remained stable.   History of convulsions and seizures, continue present anticonvulsant medications as per regimen above, outpatient neurology followup post discharge   History of anxiety continue previous regimen of Remeron with risperidone.    Hypertension and persistent tachycardia for the  last 6 months. Stable on beta blocker at present dose.    Malnutrition with gastric bypass surgery. Patient was sent here for J-tube placement, J-tube evens as above, currently on tube feedings and water flushes via J-tube, okay to take pills by mouth. We'll advance oral diet based on recommendations from patient's gastric bypass surgeon.     She has mild diarrhea which is C. difficile negative likely related to osmolar load from tube feeds. Use Imodium as needed along with nutritionist and put on reducing osmolar load.    ? H/O Enterovaginal fistula  -Reported prior to that this patient had some stools in her vagina.  -Discussed with radiology, no evidence of rectovaginal fistula and the previous imaging, (patient had indicated pelvic scan done previously).  - Consulted GI, and the did not feel that patient will benefit from colonoscopy for the diagnosis of fistula.  - Called and discussed with [facility] on-call, and they recommend to follow the patient as outpatient. No further workup required in the hospital.      Today   Subjective:   Joan [name] today has no headache,no chest abdominal pain,no new weakness tingling or numbness, feels much better.  Objective:   Blood pressure 142/64, pulse 99, temperature 98.9 F (37.2 C), temperature  source Oral, resp. rate 18, height 5' 3"  (1.6 m), weight 82.6 kg (182 lb 1.6 oz), SpO2 98.00%.  No intake or output data in the 24 hours ending 07/16/13 0941  Exam Awake but not oriented to time place or person, No new F.N deficits, Normal affect [place].AT,PERRAL Supple Neck,No JVD, No cervical lymphadenopathy appriciated.  Symmetrical Chest wall movement, Good air movement bilaterally, CTAB RRR,No Gallops,Rubs or new Murmurs, No Parasternal Heave +ve B.Sounds, Abd Soft, Non tender, No organomegaly appriciated, No rebound -guarding or rigidity. J-tube in place  No Cyanosis, Clubbing or edema, No new Rash or bruise  Data Review   CBC w Diff:  Lab  Results  Component Value Date   WBC 4.7 07/14/2013   HGB 10.3* 07/14/2013   HCT 30.2* 07/14/2013   PLT 144* 07/14/2013   LYMPHOPCT 33 06/19/2013   BANDSPCT 0 06/19/2013   MONOPCT 10 06/19/2013   EOSPCT 0 06/19/2013   BASOPCT 0 06/19/2013    CMP:  Lab Results  Component Value Date   NA 143 07/15/2013   K 3.8 07/16/2013   CL 109 07/15/2013   CO2 24 07/15/2013   BUN <3* 07/15/2013   CREATININE 0.55 07/15/2013   PROT 4.3* 07/15/2013   ALBUMIN 1.9* 07/15/2013   BILITOT 0.3 07/15/2013   ALKPHOS 55 07/15/2013   AST 15 07/15/2013   ALT 9 07/15/2013  .   Total Time in preparing paper work, data evaluation and todays exam - 35 minutes  Thurnell Lose M.D on 07/16/2013 at 9:41 AM  [facility] Office  [phone]

## 2013-07-17 ENCOUNTER — Emergency Department (HOSPITAL_COMMUNITY): Payer: Medicare Other

## 2013-07-17 ENCOUNTER — Encounter (HOSPITAL_COMMUNITY): Payer: Self-pay | Admitting: Emergency Medicine

## 2013-07-17 ENCOUNTER — Inpatient Hospital Stay (HOSPITAL_COMMUNITY)
Admission: EM | Admit: 2013-07-17 | Discharge: 2013-07-24 | DRG: 308 | Disposition: A | Discharge status: Skilled Nursing Facility | Payer: Medicare Other | Attending: Internal Medicine | Admitting: Internal Medicine

## 2013-07-17 DIAGNOSIS — A048 Other specified bacterial intestinal infections: Secondary | ICD-10-CM

## 2013-07-17 DIAGNOSIS — I498 Other specified cardiac arrhythmias: Principal | ICD-10-CM | POA: Diagnosis present

## 2013-07-17 DIAGNOSIS — J90 Pleural effusion, not elsewhere classified: Secondary | ICD-10-CM | POA: Diagnosis present

## 2013-07-17 DIAGNOSIS — M79609 Pain in unspecified limb: Secondary | ICD-10-CM | POA: Diagnosis present

## 2013-07-17 DIAGNOSIS — K9403 Colostomy malfunction: Secondary | ICD-10-CM | POA: Diagnosis present

## 2013-07-17 DIAGNOSIS — F29 Unspecified psychosis not due to a substance or known physiological condition: Secondary | ICD-10-CM

## 2013-07-17 DIAGNOSIS — Z79899 Other long term (current) drug therapy: Secondary | ICD-10-CM

## 2013-07-17 DIAGNOSIS — Z9884 Bariatric surgery status: Secondary | ICD-10-CM

## 2013-07-17 DIAGNOSIS — F411 Generalized anxiety disorder: Secondary | ICD-10-CM | POA: Diagnosis present

## 2013-07-17 DIAGNOSIS — E872 Acidosis, unspecified: Secondary | ICD-10-CM

## 2013-07-17 DIAGNOSIS — J9600 Acute respiratory failure, unspecified whether with hypoxia or hypercapnia: Secondary | ICD-10-CM | POA: Diagnosis not present

## 2013-07-17 DIAGNOSIS — IMO0001 Reserved for inherently not codable concepts without codable children: Secondary | ICD-10-CM | POA: Diagnosis present

## 2013-07-17 DIAGNOSIS — R259 Unspecified abnormal involuntary movements: Secondary | ICD-10-CM | POA: Diagnosis present

## 2013-07-17 DIAGNOSIS — K9413 Enterostomy malfunction: Secondary | ICD-10-CM | POA: Diagnosis present

## 2013-07-17 DIAGNOSIS — F3289 Other specified depressive episodes: Secondary | ICD-10-CM | POA: Diagnosis present

## 2013-07-17 DIAGNOSIS — G92 Toxic encephalopathy: Secondary | ICD-10-CM

## 2013-07-17 DIAGNOSIS — G40802 Other epilepsy, not intractable, without status epilepticus: Secondary | ICD-10-CM | POA: Diagnosis present

## 2013-07-17 DIAGNOSIS — A419 Sepsis, unspecified organism: Secondary | ICD-10-CM

## 2013-07-17 DIAGNOSIS — G40909 Epilepsy, unspecified, not intractable, without status epilepticus: Secondary | ICD-10-CM | POA: Diagnosis present

## 2013-07-17 DIAGNOSIS — E860 Dehydration: Secondary | ICD-10-CM

## 2013-07-17 DIAGNOSIS — Z934 Other artificial openings of gastrointestinal tract status: Secondary | ICD-10-CM

## 2013-07-17 DIAGNOSIS — R1110 Vomiting, unspecified: Secondary | ICD-10-CM

## 2013-07-17 DIAGNOSIS — R609 Edema, unspecified: Secondary | ICD-10-CM

## 2013-07-17 DIAGNOSIS — J189 Pneumonia, unspecified organism: Secondary | ICD-10-CM | POA: Diagnosis not present

## 2013-07-17 DIAGNOSIS — Z9049 Acquired absence of other specified parts of digestive tract: Secondary | ICD-10-CM

## 2013-07-17 DIAGNOSIS — R197 Diarrhea, unspecified: Secondary | ICD-10-CM

## 2013-07-17 DIAGNOSIS — B961 Klebsiella pneumoniae [K. pneumoniae] as the cause of diseases classified elsewhere: Secondary | ICD-10-CM | POA: Diagnosis present

## 2013-07-17 DIAGNOSIS — Z8661 Personal history of infections of the central nervous system: Secondary | ICD-10-CM

## 2013-07-17 DIAGNOSIS — E43 Unspecified severe protein-calorie malnutrition: Secondary | ICD-10-CM | POA: Diagnosis present

## 2013-07-17 DIAGNOSIS — E870 Hyperosmolality and hypernatremia: Secondary | ICD-10-CM

## 2013-07-17 DIAGNOSIS — R6510 Systemic inflammatory response syndrome (SIRS) of non-infectious origin without acute organ dysfunction: Secondary | ICD-10-CM | POA: Diagnosis present

## 2013-07-17 DIAGNOSIS — I10 Essential (primary) hypertension: Secondary | ICD-10-CM | POA: Diagnosis present

## 2013-07-17 DIAGNOSIS — R7881 Bacteremia: Secondary | ICD-10-CM | POA: Diagnosis not present

## 2013-07-17 DIAGNOSIS — N390 Urinary tract infection, site not specified: Secondary | ICD-10-CM

## 2013-07-17 DIAGNOSIS — K3184 Gastroparesis: Secondary | ICD-10-CM | POA: Diagnosis present

## 2013-07-17 DIAGNOSIS — Z8249 Family history of ischemic heart disease and other diseases of the circulatory system: Secondary | ICD-10-CM

## 2013-07-17 DIAGNOSIS — R1310 Dysphagia, unspecified: Secondary | ICD-10-CM | POA: Diagnosis present

## 2013-07-17 DIAGNOSIS — G9340 Encephalopathy, unspecified: Secondary | ICD-10-CM | POA: Diagnosis present

## 2013-07-17 DIAGNOSIS — F329 Major depressive disorder, single episode, unspecified: Secondary | ICD-10-CM | POA: Diagnosis present

## 2013-07-17 DIAGNOSIS — N179 Acute kidney failure, unspecified: Secondary | ICD-10-CM | POA: Diagnosis not present

## 2013-07-17 DIAGNOSIS — G928 Other toxic encephalopathy: Secondary | ICD-10-CM

## 2013-07-17 DIAGNOSIS — Z885 Allergy status to narcotic agent status: Secondary | ICD-10-CM

## 2013-07-17 DIAGNOSIS — Z888 Allergy status to other drugs, medicaments and biological substances status: Secondary | ICD-10-CM

## 2013-07-17 DIAGNOSIS — R000 Tachycardia, unspecified: Secondary | ICD-10-CM

## 2013-07-17 DIAGNOSIS — E876 Hypokalemia: Secondary | ICD-10-CM | POA: Diagnosis not present

## 2013-07-17 DIAGNOSIS — R112 Nausea with vomiting, unspecified: Secondary | ICD-10-CM | POA: Diagnosis present

## 2013-07-17 DIAGNOSIS — Z880 Allergy status to penicillin: Secondary | ICD-10-CM

## 2013-07-17 DIAGNOSIS — D649 Anemia, unspecified: Secondary | ICD-10-CM | POA: Diagnosis present

## 2013-07-17 DIAGNOSIS — D696 Thrombocytopenia, unspecified: Secondary | ICD-10-CM | POA: Diagnosis present

## 2013-07-17 DIAGNOSIS — Z903 Acquired absence of stomach [part of]: Secondary | ICD-10-CM

## 2013-07-17 DIAGNOSIS — Z833 Family history of diabetes mellitus: Secondary | ICD-10-CM

## 2013-07-17 DIAGNOSIS — G609 Hereditary and idiopathic neuropathy, unspecified: Secondary | ICD-10-CM | POA: Diagnosis present

## 2013-07-17 DIAGNOSIS — R569 Unspecified convulsions: Secondary | ICD-10-CM | POA: Diagnosis present

## 2013-07-17 DIAGNOSIS — Z7401 Bed confinement status: Secondary | ICD-10-CM

## 2013-07-17 DIAGNOSIS — R29818 Other symptoms and signs involving the nervous system: Secondary | ICD-10-CM | POA: Diagnosis present

## 2013-07-17 LAB — CBC WITH DIFFERENTIAL/PLATELET
BASOS PCT: 0 % (ref 0–1)
Basophils Absolute: 0 10*3/uL (ref 0.0–0.1)
Eosinophils Absolute: 0 10*3/uL (ref 0.0–0.7)
Eosinophils Relative: 0 % (ref 0–5)
HCT: 34.1 % — ABNORMAL LOW (ref 36.0–46.0)
Hemoglobin: 11.5 g/dL — ABNORMAL LOW (ref 12.0–15.0)
LYMPHS ABS: 1.3 10*3/uL (ref 0.7–4.0)
LYMPHS PCT: 15 % (ref 12–46)
MCH: 31.9 pg (ref 26.0–34.0)
MCHC: 33.7 g/dL (ref 30.0–36.0)
MCV: 94.7 fL (ref 78.0–100.0)
MONO ABS: 1.3 10*3/uL — AB (ref 0.1–1.0)
Monocytes Relative: 15 % — ABNORMAL HIGH (ref 3–12)
NEUTROS ABS: 6.3 10*3/uL (ref 1.7–7.7)
NEUTROS PCT: 70 % (ref 43–77)
Platelets: 130 10*3/uL — ABNORMAL LOW (ref 150–400)
RBC: 3.6 MIL/uL — AB (ref 3.87–5.11)
RDW: 18.4 % — ABNORMAL HIGH (ref 11.5–15.5)
WBC: 8.9 10*3/uL (ref 4.0–10.5)

## 2013-07-17 LAB — COMPREHENSIVE METABOLIC PANEL
ALBUMIN: 2.3 g/dL — AB (ref 3.5–5.2)
ALK PHOS: 68 U/L (ref 39–117)
ALT: 10 U/L (ref 0–35)
AST: 21 U/L (ref 0–37)
BILIRUBIN TOTAL: 0.5 mg/dL (ref 0.3–1.2)
BUN: 4 mg/dL — AB (ref 6–23)
CHLORIDE: 108 meq/L (ref 96–112)
CO2: 24 meq/L (ref 19–32)
Calcium: 8 mg/dL — ABNORMAL LOW (ref 8.4–10.5)
Creatinine, Ser: 0.55 mg/dL (ref 0.50–1.10)
GFR calc Af Amer: >90 mL/min (ref >90)
GLUCOSE: 85 mg/dL (ref 70–99)
POTASSIUM: 3.7 meq/L (ref 3.7–5.3)
Sodium: 144 mEq/L (ref 137–147)
Total Protein: 4.9 g/dL — ABNORMAL LOW (ref 6.0–8.3)

## 2013-07-17 LAB — URINALYSIS, ROUTINE W REFLEX MICROSCOPIC
Bilirubin Urine: NEGATIVE
GLUCOSE, UA: NEGATIVE mg/dL
Hgb urine dipstick: NEGATIVE
Ketones, ur: NEGATIVE mg/dL
Nitrite: NEGATIVE
PROTEIN: NEGATIVE mg/dL
Specific Gravity, Urine: 1.02 (ref 1.005–1.030)
UROBILINOGEN UA: 1 mg/dL (ref 0.0–1.0)
pH: 5 (ref 5.0–8.0)

## 2013-07-17 LAB — URINE MICROSCOPIC-ADD ON

## 2013-07-17 LAB — CLOSTRIDIUM DIFFICILE BY PCR: CDIFFPCR: NEGATIVE

## 2013-07-17 LAB — GLUCOSE, CAPILLARY: GLUCOSE-CAPILLARY: 78 mg/dL (ref 70–99)

## 2013-07-17 LAB — CG4 I-STAT (LACTIC ACID): LACTIC ACID, VENOUS: 1.02 mmol/L (ref 0.5–2.2)

## 2013-07-17 LAB — RPR: RPR: NONREACTIVE

## 2013-07-17 LAB — AMMONIA: AMMONIA: 17 umol/L (ref 11–60)

## 2013-07-17 MED ORDER — VALPROIC ACID 250 MG PO CAPS
500.0000 mg | ORAL_CAPSULE | Freq: Three times a day (TID) | ORAL | Status: DC
  Administered 2013-07-17 – 2013-07-21 (×13): 500 mg via ORAL
  Filled 2013-07-17 (×15): qty 2

## 2013-07-17 MED ORDER — IOHEXOL 350 MG/ML SOLN
100.0000 mL | Freq: Once | INTRAVENOUS | Status: AC | PRN
  Administered 2013-07-17: 70 mL via INTRAVENOUS

## 2013-07-17 MED ORDER — VITAMIN B-1 100 MG PO TABS
100.0000 mg | ORAL_TABLET | Freq: Every day | ORAL | Status: DC
  Administered 2013-07-17 – 2013-07-21 (×5): 100 mg via ORAL
  Filled 2013-07-17 (×5): qty 1

## 2013-07-17 MED ORDER — ENOXAPARIN SODIUM 40 MG/0.4ML ~~LOC~~ SOLN
40.0000 mg | SUBCUTANEOUS | Status: DC
  Administered 2013-07-17 – 2013-07-24 (×8): 40 mg via SUBCUTANEOUS
  Filled 2013-07-17 (×8): qty 0.4

## 2013-07-17 MED ORDER — MIRTAZAPINE 30 MG PO TBDP
30.0000 mg | ORAL_TABLET | Freq: Every day | ORAL | Status: DC
  Administered 2013-07-17 – 2013-07-22 (×6): 30 mg via ORAL
  Filled 2013-07-17 (×7): qty 1

## 2013-07-17 MED ORDER — CIPROFLOXACIN IN D5W 400 MG/200ML IV SOLN
400.0000 mg | Freq: Two times a day (BID) | INTRAVENOUS | Status: DC
  Administered 2013-07-17 – 2013-07-24 (×15): 400 mg via INTRAVENOUS
  Filled 2013-07-17 (×17): qty 200

## 2013-07-17 MED ORDER — ACETAMINOPHEN 325 MG PO TABS
650.0000 mg | ORAL_TABLET | Freq: Four times a day (QID) | ORAL | Status: DC | PRN
  Administered 2013-07-19 – 2013-07-22 (×6): 650 mg via ORAL
  Filled 2013-07-17 (×7): qty 2

## 2013-07-17 MED ORDER — VANCOMYCIN HCL IN DEXTROSE 1-5 GM/200ML-% IV SOLN
1000.0000 mg | Freq: Once | INTRAVENOUS | Status: AC
  Administered 2013-07-17: 1000 mg via INTRAVENOUS
  Filled 2013-07-17: qty 200

## 2013-07-17 MED ORDER — ONDANSETRON HCL 4 MG/2ML IJ SOLN
4.0000 mg | Freq: Four times a day (QID) | INTRAMUSCULAR | Status: DC | PRN
Start: 2013-07-17 — End: 2013-07-24
  Administered 2013-07-18 – 2013-07-23 (×5): 4 mg via INTRAVENOUS
  Filled 2013-07-17 (×5): qty 2

## 2013-07-17 MED ORDER — FERROUS SULFATE 325 (65 FE) MG PO TABS
325.0000 mg | ORAL_TABLET | Freq: Two times a day (BID) | ORAL | Status: DC
  Administered 2013-07-17 – 2013-07-21 (×8): 325 mg via ORAL
  Filled 2013-07-17 (×10): qty 1

## 2013-07-17 MED ORDER — FOLIC ACID 1 MG PO TABS
1.0000 mg | ORAL_TABLET | Freq: Every day | ORAL | Status: DC
Start: 2013-07-17 — End: 2013-07-21
  Administered 2013-07-17 – 2013-07-21 (×5): 1 mg via ORAL
  Filled 2013-07-17 (×5): qty 1

## 2013-07-17 MED ORDER — ACETAMINOPHEN 650 MG RE SUPP: 650.0000 mg | Freq: Four times a day (QID) | RECTAL | Status: DC | PRN

## 2013-07-17 MED ORDER — VITAL AF 1.2 CAL PO LIQD
1000.0000 mL | ORAL | Status: DC
  Administered 2013-07-17 – 2013-07-19 (×3): 1000 mL
  Filled 2013-07-17 (×9): qty 1000

## 2013-07-17 MED ORDER — LEVETIRACETAM 500 MG PO TABS
1000.0000 mg | ORAL_TABLET | Freq: Two times a day (BID) | ORAL | Status: DC
  Administered 2013-07-17 – 2013-07-21 (×9): 1000 mg via ORAL
  Filled 2013-07-17 (×10): qty 2

## 2013-07-17 MED ORDER — ATENOLOL 50 MG PO TABS
50.0000 mg | ORAL_TABLET | Freq: Two times a day (BID) | ORAL | Status: DC
  Administered 2013-07-17 – 2013-07-18 (×4): 50 mg via ORAL
  Filled 2013-07-17 (×6): qty 1

## 2013-07-17 MED ORDER — WHITE PETROLATUM GEL
Status: DC | PRN
  Filled 2013-07-17: qty 5

## 2013-07-17 MED ORDER — IOHEXOL 300 MG/ML  SOLN
50.0000 mL | Freq: Once | INTRAMUSCULAR | Status: AC | PRN
  Administered 2013-07-17: 50 mL via INTRATHECAL

## 2013-07-17 MED ORDER — POTASSIUM CHLORIDE 20 MEQ/15ML (10%) PO LIQD
40.0000 meq | Freq: Two times a day (BID) | ORAL | Status: DC
  Administered 2013-07-17 – 2013-07-19 (×6): 40 meq via ORAL
  Filled 2013-07-17 (×8): qty 30

## 2013-07-17 MED ORDER — ONDANSETRON HCL 4 MG PO TABS
4.0000 mg | ORAL_TABLET | Freq: Four times a day (QID) | ORAL | Status: DC | PRN
  Administered 2013-07-20 – 2013-07-24 (×2): 4 mg via ORAL
  Filled 2013-07-17 (×2): qty 1

## 2013-07-17 MED ORDER — ELDERTONIC PO ELIX
15.0000 mL | ORAL_SOLUTION | Freq: Every day | ORAL | Status: DC
  Administered 2013-07-17 – 2013-07-21 (×5): 15 mL via ORAL
  Filled 2013-07-17 (×5): qty 15

## 2013-07-17 MED ORDER — CHLORHEXIDINE GLUCONATE 0.12 % MT SOLN
15.0000 mL | Freq: Two times a day (BID) | OROMUCOSAL | Status: DC
  Administered 2013-07-17 – 2013-07-24 (×15): 15 mL via OROMUCOSAL
  Filled 2013-07-17 (×16): qty 15

## 2013-07-17 MED ORDER — PANTOPRAZOLE SODIUM 40 MG PO PACK
40.0000 mg | PACK | Freq: Every day | ORAL | Status: DC
  Administered 2013-07-17 – 2013-07-24 (×8): 40 mg
  Filled 2013-07-17 (×11): qty 20

## 2013-07-17 MED ORDER — SODIUM CHLORIDE 0.9 % IV BOLUS (SEPSIS)
1000.0000 mL | Freq: Once | INTRAVENOUS | Status: AC
  Administered 2013-07-17: 1000 mL via INTRAVENOUS

## 2013-07-17 MED ORDER — RISPERIDONE 0.5 MG PO TBDP
0.5000 mg | ORAL_TABLET | Freq: Two times a day (BID) | ORAL | Status: DC
  Administered 2013-07-17 – 2013-07-22 (×11): 0.5 mg via ORAL
  Filled 2013-07-17 (×15): qty 1

## 2013-07-17 NOTE — Consult Note (Signed)
Pt discussed with NP.  Imaging reviewed  Plan per NP note  Mary SellaEric M. Andrey CampanileWilson, MD, FACS General, Bariatric, & Minimally Invasive Surgery [facility], [place]

## 2013-07-17 NOTE — ED Provider Notes (Signed)
CSN: [id number]     Arrival date & time 07/17/13  0259 History   First MD Initiated Contact with Patient 07/17/13 0259     Chief Complaint  Patient presents with  . Tachycardia  . Abdominal Pain     (Consider location/radiation/quality/duration/timing/severity/associated sxs/prior Treatment) HPI  Patient is a 53 yo woman who is BIB EMS from the SNF where she resides. She was discharged from this hospital 2 days ago with diagnosis of Klebsiella bacteremia, encephalitis of unclear etiology, SBO and s/p J tube placement. The patient has had multiple extended hospitalizations over the past year, is deconditioned and currently bed bound at baseline.   She was sent to the ED for evaluation of tachycardia to the 130s and 140s and because her j-tube had become unsecured. The patient's only complaint is foot pain which is chronic and secondary to peripheral neuropathy. She denies abdominal pain. There are no reports from SNF of vomiting. The patient denies nausea and vomiting. She had a large stool shortly after arriving to the ED.   No documented fevers. The patient was discharged with a right UE PIC line receiving Cipro for Klebsiella UTI.    Past Medical History  Diagnosis Date  . Peripheral neuropathy   . Fibromyalgia   . Hypertension   . Anxiety   . Catatonia   . Seizures    Past Surgical History  Procedure Laterality Date  . Gastric bypass    . Abdominal hysterectomy    . Jejunostomy N/A 06/25/2013    Procedure:  OPEN JEJUNOSTOMY FEEDING TUBE ;  Surgeon: Cherylynn RidgesJames O [name], MD;  [facility];  Service: General;  Laterality: N/A;  . Laparotomy N/A 07/02/2013    Procedure: EXPLORATORY LAPAROTOMY with revision of jejunostomy feeding tube.;  Surgeon: Cherylynn RidgesJames O [name], MD;  [facility];  Service: General;  Laterality: N/A;  . Bowel resection N/A 07/02/2013    Procedure: SMALL BOWEL RESECTION;  Surgeon: Cherylynn RidgesJames O [name], MD;  [facility];  Service: General;  Laterality: N/A;   Family  History  Problem Relation Age of Onset  . Hypertension Mother   . Diabetes Father    History  Substance Use Topics  . Smoking status: Never Smoker   . Smokeless tobacco: Not on file  . Alcohol Use: No   OB History   Grav Para Term Preterm Abortions TAB SAB Ect Mult Living                 Review of Systems Ten point review of symptoms performed and is negative with the exception of symptoms noted above.     Allergies  Morphine and related; Paroxetine hcl; Penicillins; and Sertraline hcl  Home Medications   Current Outpatient Rx  Name  Route  Sig  Dispense  Refill  . acetaminophen (TYLENOL) 325 MG tablet   Oral   Take 650 mg by mouth every 4 (four) hours as needed for mild pain or fever.         [name]. atenolol (TENORMIN) 100 MG tablet   Oral   Take 1 tablet (100 mg total) by mouth 2 (two) times daily.         . chlorhexidine (PERIDEX) 0.12 % solution   Mouth/Throat   Use as directed 15 mLs in the mouth or throat 2 (two) times daily.         . ciprofloxacin (CIPRO) 400 MG/200ML SOLN   Intravenous   Inject 200 mLs (400 mg total) into the vein every 12 (twelve)  hours. For 10 more days   2000 mL   1   . ferrous sulfate 325 (65 FE) MG tablet   Oral   Take 1 tablet (325 mg total) by mouth 2 (two) times daily with a meal.      3   . folic acid (FOLVITE) 1 MG tablet   Oral   Take 1 mg by mouth daily.         [name] levETIRAcetam (KEPPRA) 1000 MG tablet   Oral   Take 1,000 mg by mouth 2 (two) times daily.         [name] loperamide (IMODIUM) 2 MG capsule   Oral   Take 2 mg by mouth as needed for diarrhea or loose stools.         . megestrol (MEGACE) 400 MG/10ML suspension   Oral   Take 400 mg by mouth daily.         . mirtazapine (REMERON SOL-TAB) 30 MG disintegrating tablet   Oral   Take 30 mg by mouth at bedtime.         . Multiple Vitamins-Minerals (ELDERTONIC PO)   Oral   Take 15 mLs by mouth 2 (two) times daily.         . Nutritional  Supplements (FEEDING SUPPLEMENT, VITAL AF 1.2 CAL,) LIQD   Per Tube   Place 1,000 mLs into feeding tube continuous.         . Nutritional Supplements (NUTRITIONAL SHAKE) LIQD   Oral   Take 1 Can by mouth 3 (three) times daily before meals. Mighty shakes.         [name] ondansetron (ZOFRAN) 4 MG tablet   Oral   Take 1 tablet (4 mg total) by mouth every 8 (eight) hours as needed for nausea or vomiting.   20 tablet   0   . pantoprazole sodium (PROTONIX) 40 mg/20 mL PACK   Per Tube   Place 20 mLs (40 mg total) into feeding tube daily at 12 noon.   30 each      . potassium chloride 20 MEQ/15ML (10%) solution   Oral   Take 30 mLs (40 mEq total) by mouth 2 (two) times daily.   500 mL   0   . risperiDONE (RISPERDAL M-TABS) 0.5 MG disintegrating tablet   Oral   Take 1 tablet (0.5 mg total) by mouth 2 (two) times daily.         [name] thiamine (VITAMIN B-1) 100 MG tablet   Oral   Take 100 mg by mouth daily.         [name] valproic acid (DEPAKENE) 250 MG capsule   Oral   Take 2 capsules (500 mg total) by mouth 3 (three) times daily. Take 500mg  in the morning, 250mg  in the afternoon, and 500mg  at bedtime.         . Water For Irrigation, Sterile (FREE WATER) SOLN   Per Tube   Place 140 mLs into feeding tube 4 (four) times daily.          BP 107/77  Pulse 71  Temp(Src) 97.8 F (36.6 C) (Oral)  Resp 26  SpO2 100% Physical Exam Gen: well developed and well nourished appearing, chronically ill appearing Head: NCAT Eyes: PERL, EOMI Nose: no epistaixis or rhinorrhea Mouth/throat: mucosa is moist and pink Neck: supple, no stridor Lungs: CTA B, no wheezing, rhonchi or rales CV: rapid and regular, pulse 130s, no murmur, extremities appear well perfused.  Abd: soft, notender, nondistended, j tube  in place but suture attaching tube to skin has been disrupted.  Back: normal to inspection Skin: warm and dry, mild skin breakdown over the gluteal folds bilaterally Ext: normal to  inspection, bilateral pretibial edema is symmetric,  Neuro: CN ii-xii grossly intact, moves all 4 extremeties with apparently equal strength Psyche; off  affect,  calm and cooperative. Oriented to self.   ED Course  Procedures (including critical care time) Labs Review Results for orders placed during the hospital encounter of 07/17/13 (from the past 24 hour(s))  CBC WITH DIFFERENTIAL     Status: Abnormal   Collection Time    07/17/13  3:35 AM      Result Value Ref Range   WBC 8.9  4.0 - 10.5 K/uL   RBC 3.60 (*) 3.87 - 5.11 MIL/uL   Hemoglobin 11.5 (*) 12.0 - 15.0 g/dL   HCT 09.8 (*) 11.9 - 14.7 %   MCV 94.7  78.0 - 100.0 fL   MCH 31.9  26.0 - 34.0 pg   MCHC 33.7  30.0 - 36.0 g/dL   RDW 82.9 (*) 56.2 - 13.0 %   Platelets 130 (*) 150 - 400 K/uL   Neutrophils Relative % 70  43 - 77 %   Neutro Abs 6.3  1.7 - 7.7 K/uL   Lymphocytes Relative 15  12 - 46 %   Lymphs Abs 1.3  0.7 - 4.0 K/uL   Monocytes Relative 15 (*) 3 - 12 %   Monocytes Absolute 1.3 (*) 0.1 - 1.0 K/uL   Eosinophils Relative 0  0 - 5 %   Eosinophils Absolute 0.0  0.0 - 0.7 K/uL   Basophils Relative 0  0 - 1 %   Basophils Absolute 0.0  0.0 - 0.1 K/uL  COMPREHENSIVE METABOLIC PANEL     Status: Abnormal   Collection Time    07/17/13  3:35 AM      Result Value Ref Range   Sodium 144  137 - 147 mEq/L   Potassium 3.7  3.7 - 5.3 mEq/L   Chloride 108  96 - 112 mEq/L   CO2 24  19 - 32 mEq/L   Glucose, Bld 85  70 - 99 mg/dL   BUN 4 (*) 6 - 23 mg/dL   Creatinine, Ser 8.65  0.50 - 1.10 mg/dL   Calcium 8.0 (*) 8.4 - 10.5 mg/dL   Total Protein 4.9 (*) 6.0 - 8.3 g/dL   Albumin 2.3 (*) 3.5 - 5.2 g/dL   AST 21  0 - 37 U/L   ALT 10  0 - 35 U/L   Alkaline Phosphatase 68  39 - 117 U/L   Total Bilirubin 0.5  0.3 - 1.2 mg/dL   GFR calc non Af Amer >90  >90 mL/min   GFR calc Af Amer >90  >90 mL/min  CG4 I-STAT (LACTIC ACID)     Status: None   Collection Time    07/17/13  3:53 AM      Result Value Ref Range   Lactic Acid,  Venous 1.02  0.5 - 2.2 mmol/L  URINALYSIS, ROUTINE W REFLEX MICROSCOPIC     Status: Abnormal   Collection Time    07/17/13  4:27 AM      Result Value Ref Range   Color, Urine YELLOW  YELLOW   APPearance CLEAR  CLEAR   Specific Gravity, Urine 1.020  1.005 - 1.030   pH 5.0  5.0 - 8.0   Glucose, UA NEGATIVE  NEGATIVE mg/dL  Hgb urine dipstick NEGATIVE  NEGATIVE   Bilirubin Urine NEGATIVE  NEGATIVE   Ketones, ur NEGATIVE  NEGATIVE mg/dL   Protein, ur NEGATIVE  NEGATIVE mg/dL   Urobilinogen, UA 1.0  0.0 - 1.0 mg/dL   Nitrite NEGATIVE  NEGATIVE   Leukocytes, UA TRACE (*) NEGATIVE  URINE MICROSCOPIC-ADD ON     Status: Abnormal   Collection Time    07/17/13  4:27 AM      Result Value Ref Range   Squamous Epithelial / LPF RARE  RARE   WBC, UA 3-6  <3 WBC/hpf   RBC / HPF 0-2  <3 RBC/hpf   Bacteria, UA RARE  RARE   Crystals CA OXALATE CRYSTALS (*) NEGATIVE   Urine-Other MUCOUS PRESENT     Imaging Review Dg Chest Portable 1 View  07/17/2013   CLINICAL DATA:  Abdominal pain, tachycardia.  EXAM: PORTABLE CHEST - 1 VIEW  COMPARISON:  CT ANGIO CHEST W/CM &/OR WO/CM dated 07/11/2013; DG CHEST 1V PORT dated 07/11/2013  FINDINGS: Moderate right pleural effusion, likely slightly increased since prior study. Interval removal of right hemodialysis catheter. Right PICC line is unchanged. Patchy right lung airspace disease and left basilar airspace disease medially, both slightly increased since prior study. No acute bony abnormality.  IMPRESSION: Increasing patchy bilateral airspace disease as above. Increasing moderate right effusion.   Electronically Signed   By: Charlett Nose M.D.   On: 07/17/2013 03:54   Dg Abd Portable 1v  07/17/2013   CLINICAL DATA:  J-tube placement.  EXAM: PORTABLE ABDOMEN - 1 VIEW  COMPARISON:  07/13/2013  FINDINGS: Contrast was injected through the jejunostomy tube. This opacifies small bowel loops in the upper pelvis. No evidence of contrast extravasation.  Slightly prominent  bowel loops or superiorly in the abdomen, nonspecific. Prior cholecystectomy. No free air.  IMPRESSION: Jejunostomy tube within upper pelvic small bowel loops. Mildly prominent abdominal small bowel loops which could reflect mild focal ileus although early or partial small bowel obstruction cannot be excluded.   Electronically Signed   By: Charlett Nose M.D.   On: 07/17/2013 03:57    EKG: sinus tach with rate of 128 bpm, no acute ischemic changes, normal intervals, normal axis, normal qrs complex MDM   DDX: uti with sepsis, line sepsis, pneumona, anemia, dehydration, electrolyte disturbance  ED work up is non-diagnostic. The patient has been ruled out for PE because of persistent tachycardia despite crystalloid resuscitation. U/A appears wnl. No pneumonia on CXR. Concern at this point is for possible line sepsis. We will obtain blood cultures and cover with Vancomycin. Case discussed with Dr. Farrel Conners, Hospitalist, who will see and evaluate the patient for admission.       Brandt Loosen, MD 07/17/13 [phone]

## 2013-07-17 NOTE — Evaluation (Signed)
Clinical/Bedside Swallow Evaluation Patient Details  Name: Joan PeelingLora Funderburke MRN: [id number] Date of Birth: 04/10/1961  Today's Date: 07/17/2013 Time: 1548-1600 SLP Time Calculation (min): 12 min  Past Medical History:  Past Medical History  Diagnosis Date  . Peripheral neuropathy   . Fibromyalgia   . Hypertension   . Anxiety   . Catatonia   . Seizures    Past Surgical History:  Past Surgical History  Procedure Laterality Date  . Gastric bypass    . Abdominal hysterectomy    . Jejunostomy N/A 06/25/2013    Procedure:  OPEN JEJUNOSTOMY FEEDING TUBE ;  Surgeon: Cherylynn RidgesJames O [name], MD;  [facility];  Service: General;  Laterality: N/A;  . Laparotomy N/A 07/02/2013    Procedure: EXPLORATORY LAPAROTOMY with revision of jejunostomy feeding tube.;  Surgeon: Cherylynn RidgesJames O [name], MD;  [facility];  Service: General;  Laterality: N/A;  . Bowel resection N/A 07/02/2013    Procedure: SMALL BOWEL RESECTION;  Surgeon: Cherylynn RidgesJames O [name], MD;  [facility];  Service: General;  Laterality: N/A;   HPI:  53 year old female with a history of seizure disorder, hypertension, peripheral neuropathy, meningoencephalitis of unclear etiology [facility]lodged J-tube. The patient was recently discharged from the hospital after a prolonged stay from 06/19/2013 through 07/16/2013 due to bowel obstruction with J-tube revision, bacteremia, UTI, meningoencephalitis, and protein calorie malnutrition. The patient was discharged to her skilled nursing facility with instructions for 10 additional days of ciprofloxacin through her PICC line. In the ED, the patient was noted to be tachycardic as stated above. Abdominal x-ray showed that the J-tube was in the small bowel loops. However there was concern for possible ileus and PSBO could not be excluded. Current dx include sinus tachycardia/SIRS, acute encephalopathy, pleural effusions.  Cognitive baseline unknown - pt with dysarthric speech.       Assessment / Plan / Recommendation Clinical Impression  Pt known to SLP services, having undergone clinical swallow evaluations on 07/13/13 and 05/16/13.   At time of most recent assessment,  pt appeared to be protecting airway with POs, but presented with oral deficits that prohibited mechanical solids.  Today, presentation is similar with no s/s of aspiration, but difficulty with mastication of solids and inadequate oral preparation.    If pt deemed medically able to consume POs, recommend a dysphagia 1 diet with thin liquids; crushing meds only for oral consumption (not thru J tube per surgeon's note.)  Pt requires assistance with self-feeding.    Recommend f/u at SNF to address diet advancement when appropriate.            Diet Recommendation Dysphagia 1 (Puree);Thin liquid   Medication Administration: Crushed with puree Supervision: Staff to assist with self feeding;Full supervision/cueing for compensatory strategies Compensations: Slow rate;Small sips/bites;Check for pocketing Postural Changes and/or Swallow Maneuvers: Seated upright 90 degrees;Upright 30-60 min after meal    Other  Recommendations Oral Care Recommendations: Oral care BID   Follow Up Recommendations  Skilled Nursing facility /SLP f/u next venue of care          Swallow Study Prior Functional Status       General Type of Study: Bedside swallow evaluation Previous Swallow Assessment: BSE 12/18 - Dys 1/thin; 2/14 BSE also Dys 1/thin  Diet Prior to this Study: Regular;Thin liquids Temperature Spikes Noted: No Respiratory Status: Room air History of Recent Intubation: No Behavior/Cognition: Alert;Cooperative;Confused Oral Cavity - Dentition: Missing dentition Self-Feeding Abilities: Needs assist Patient Positioning: Upright in bed  Baseline Vocal Quality: Clear;Low vocal intensity Volitional Cough: Strong Volitional Swallow: Unable to elicit    Oral/Motor/Sensory Function Overall Oral Motor/Sensory  Function: Appears within functional limits for tasks assessed   Ice Chips Ice chips: Not tested   Thin Liquid Thin Liquid: Within functional limits Presentation: Cup;Straw    Nectar Thick Nectar Thick Liquid: Not tested   Honey Thick Honey Thick Liquid: Not tested   Puree Puree: Impaired Presentation: Spoon Oral Phase Impairments: Impaired anterior to posterior transit; intermittent absence of mastication Oral Phase Functional Implications: Prolonged oral transit   Solid   Tyr Franca L. Aguilar, [place] CCC/SLP Pager [phone]     Solid: Impaired Oral Phase Impairments: Impaired mastication       Blenda Mounts Laurice 07/17/2013,4:11 PM

## 2013-07-17 NOTE — H&P (Addendum)
[facility] History and Physical  Adra Shepler WUJ:[id number] DOB: 1960/06/11 DOA: 07/17/2013   PCP: [name], MD   Chief Complaint: tachycardia, dislodged J-tube  HPI:  53 year old female with a history of seizure disorder, hypertension, peripheral neuropathy, meningoencephalitis of unclear etiology presented from [facility] with tachycardia and dislodged J-tube. The patient was recently discharged from the hospital after a prolonged stay from 06/19/2013 through 07/16/2013 due to bowel obstruction with J-tube revision, bacteremia, UTI, meningoencephalitis, and protein calorie malnutrition. The patient was discharged to her skilled nursing facility with instructions for 10 additional days of ciprofloxacin through her PICC line. The patient is pleasantly confused and is unable to provide any history at this time. History is obtained from review of the medical records and speaking with EDP. The patient was noted to be tachycardic with a heart rate in the 130-140, but the patient was afebrile and hemodynamically stable. The patient endorsed vomiting, but not clear on reliability.  In the ED, the patient was noted to be tachycardic as stated above. Abdominal x-ray showed that the J-tube was in the small bowel loops. However there was concern for possible ileus and PSBO could not be excluded. CT angiogram of the chest was negative for pulmonary embolus but revealed a stable bilateral pleural effusions, right greater than left. CBC showed mild thrombocytopenia with platelets 130,000. VBC was 8.9. BMP was unremarkable. Urinalysis did not show any pyuria. Lactic acid was 1.02. EKG showed sinus tachycardia with nonspecific T-wave changes. Assessment/Plan: Sinus tachycardia/SIRS -According to the medical record this has been a chronic problem for the last several weeks which showed some mild improvement after beta blocker was started on the last admission -This is likely due to her acute medical  illness -Concerns for DVT right upper extremity as well as intra-abdominal process -I have spoken to Dr. 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 Pt received diatech catheter on 06/20/2013 placement and received and finished plasmapheresis -reconsult neuro if no improvement -speech to eval swallowing -Check serum B12, RBC folate, RPR, ammonia Hx of Convulsions/Seizures  -Pt has a PMH of seizures and catatonia.  -MRI of the brain showed thalamic signal abnormality during previous admission. -continue current AEDs Hx of Anxiety  -Continue pt on Remeron  -Patient appears to have had a psychiatric history  -Current illness may be exacerbating psychiatric symptoms.  -Psych prescribed Risperdal .5 mg bid previous admission HTN -restart atenolol at half home dose and titrate as needed Malnutrition with history of Gastric Bypass  -has had no PO intake  -SNF sent her to hospital for J tube -Gen Surg consulted, J-tube placed and underwent revision of j tube with exploratory laparotomy on 2/3 and small bowel resection.  Thrombocytopenia  -No acute bleeding monitor -chronic Pleural effusions -Currently no respiratory distress -Oxygen saturation 100% on room air -May need to reassess need for thoracocentesis if becomes hypoxemic -Remains stable when compared to prior CT angiogram of the chest on 07/11/2013 Loose Stools -Patient had large volume yellow loose stool in the ED -Given the patient's history of C. difficile, check a PCR -Place patient on contact isolation Active Problems:   SIRS  (systemic inflammatory response  syndrome)       Past Medical History  Diagnosis Date  . Peripheral neuropathy   . Fibromyalgia   . Hypertension   . Anxiety   . Catatonia   . Seizures    Past Surgical History  Procedure Laterality Date  . Gastric bypass    . Abdominal hysterectomy    . Jejunostomy N/A 06/25/2013    Procedure:  OPEN JEJUNOSTOMY FEEDING TUBE ;  Surgeon: Cherylynn Ridges, MD;  Location: [facility] OR;  Service: General;  Laterality: N/A;  . Laparotomy N/A 07/02/2013    Procedure: EXPLORATORY LAPAROTOMY with revision of jejunostomy feeding tube.;  Surgeon: Cherylynn Ridges, MD;  Location: [facility] OR;  Service: General;  Laterality: N/A;  . Bowel resection N/A 07/02/2013    Procedure: SMALL BOWEL RESECTION;  Surgeon: Cherylynn Ridges, MD;  Location: [facility] OR;  Service: General;  Laterality: N/A;   Social History:  reports that she has never smoked. She does not have any smokeless tobacco history on file. She reports that she does not drink alcohol. Her drug history is not on file.   Family History  Problem Relation Age of Onset  . Hypertension Mother   . Diabetes Father      Allergies  Allergen Reactions  . Morphine And Related Other (See Comments)    REACTION: Causes pain  . Paroxetine Hcl Other (See Comments)    REACTION:  unknown  . Penicillins Other (See Comments)    REACTION: Makes her body hurt.  . Sertraline Hcl Other (See Comments)    REACTION:  unknown      Prior to Admission medications   Medication Sig Start Date End Date Taking? Authorizing Provider  acetaminophen (TYLENOL) 325 MG tablet Take 650 mg by mouth every 4 (four) hours as needed for mild pain or fever.    Historical Provider, MD  atenolol (TENORMIN) 100 MG tablet Take 1 tablet (100 mg total) by mouth 2 (two) times daily. 07/16/13   Leroy Sea, MD  chlorhexidine (PERIDEX) 0.12 % solution Use as directed 15 mLs in the mouth or throat 2 (two) times daily.    Historical Provider, MD  ciprofloxacin (CIPRO)  400 MG/200ML SOLN Inject 200 mLs (400 mg total) into the vein every 12 (twelve) hours. For 10 more days 07/16/13   Leroy Sea, MD  ferrous sulfate 325 (65 FE) MG tablet Take 1 tablet (325 mg total) by mouth 2 (two) times daily with a meal. 07/16/13   Leroy Sea, MD  folic acid (FOLVITE) 1 MG tablet Take 1 mg by mouth daily.    Historical Provider, MD  levETIRAcetam (KEPPRA) 1000 MG tablet Take 1,000 mg by mouth 2 (two) times daily.    Historical Provider, MD  loperamide (IMODIUM) 2 MG capsule Take 2 mg by mouth as needed for diarrhea or loose stools.    Historical Provider, MD  megestrol (MEGACE) 400 MG/10ML suspension Take 400 mg by mouth daily.    Historical Provider, MD  mirtazapine (REMERON SOL-TAB) 30 MG disintegrating tablet Take 30 mg by mouth at bedtime.    Historical Provider, MD  Multiple Vitamins-Minerals (ELDERTONIC PO) Take 15 mLs by mouth 2 (two) times daily.    Historical Provider, MD  Nutritional Supplements (FEEDING SUPPLEMENT, VITAL AF 1.2 CAL,) LIQD Place 1,000 mLs into feeding tube continuous. 07/16/13   Leroy Sea, MD  Nutritional Supplements (NUTRITIONAL SHAKE) LIQD Take 1 Can by mouth 3 (three) times daily before meals. Mighty shakes.    Historical  Provider, MD  ondansetron (ZOFRAN) 4 MG tablet Take 1 tablet (4 mg total) by mouth every 8 (eight) hours as needed for nausea or vomiting. 07/16/13   Leroy SeaPrashant K Singh, MD  pantoprazole sodium (PROTONIX) 40 mg/20 mL PACK Place 20 mLs (40 mg total) into feeding tube daily at 12 noon. 07/16/13   Leroy SeaPrashant K Singh, MD  potassium chloride 20 MEQ/15ML (10%) solution Take 30 mLs (40 mEq total) by mouth 2 (two) times daily. 07/16/13   Leroy SeaPrashant K Singh, MD  risperiDONE (RISPERDAL M-TABS) 0.5 MG disintegrating tablet Take 1 tablet (0.5 mg total) by mouth 2 (two) times daily. 07/16/13   Leroy SeaPrashant K Singh, MD  thiamine (VITAMIN B-1) 100 MG tablet Take 100 mg by mouth daily.    Historical Provider, MD  valproic acid (DEPAKENE) 250 MG  capsule Take 2 capsules (500 mg total) by mouth 3 (three) times daily. Take 500mg  in the morning, 250mg  in the afternoon, and 500mg  at bedtime. 07/16/13   Leroy SeaPrashant K Singh, MD  Water For Irrigation, Sterile (FREE WATER) SOLN Place 140 mLs into feeding tube 4 (four) times daily. 07/16/13   Leroy SeaPrashant K Singh, MD    Review of Systems:  Unreliable due to the patient's acute encephalopathy  Physical Exam: Filed Vitals:   07/17/13 0430 07/17/13 0515 07/17/13 0530 07/17/13 0601  BP: 107/77   134/92  Pulse: 71 120 123   Temp:      TempSrc:      Resp: 26 23 33   SpO2: 100% 100% 100%    General:  A&O x 1, NAD, nontoxic, pleasant/cooperative Head/Eye: No conjunctival hemorrhage, no icterus, [place]/AT, No nystagmus ENT:  No icterus,  No thrush, good dentition, no pharyngeal exudate Neck:  No masses, no lymphadenpathy, no bruits CV:  RRR, no rub, no gallop, no S3 Lung:  Bibasilar crackles. No wheezing. Good air movement. Abdomen: soft/NT, +BS, nondistended, no peritoneal signs Ext: No cyanosis, No rashes, No petechiae, No lymphangitis, 2+ edema   Labs on Admission:  Basic Metabolic Panel:  Recent Labs Lab 07/12/13 0500 07/13/13 0500 07/14/13 0415 07/15/13 0500  07/16/13 0550 07/17/13 0335  NA 139 144 146 143  --   --  144  K 4.5 3.2* 3.1* 2.5*  < > 3.8 3.7  CL 106 111 112 109  --   --  108  CO2 22 24 26 24   --   --  24  GLUCOSE 95 89 81 101*  --   --  85  BUN 6 4* 3* <3*  --   --  4*  CREATININE 0.51 0.51 0.59 0.55  --   --  0.55  CALCIUM 8.3* 7.7* 7.9* 7.7*  --   --  8.0*  MG 1.8  --  2.0  --   --  1.9  --   < > = values in this interval not displayed. Liver Function Tests:  Recent Labs Lab 07/12/13 0500 07/13/13 0500 07/14/13 0415 07/15/13 0500 07/17/13 0335  AST 19 15 16 15 21   ALT 10 9 9 9 10   ALKPHOS 61 53 49 55 68  BILITOT 0.5 0.3 0.3 0.3 0.5  PROT 4.6* 4.2* 4.0* 4.3* 4.9*  ALBUMIN 2.0* 1.8* 1.8* 1.9* 2.3*   No results found for this basename: LIPASE, AMYLASE,  in  the last 168 hours No results found for this basename: AMMONIA,  in the last 168 hours CBC:  Recent Labs Lab 07/11/13 0500 07/12/13 0500 07/13/13 0500 07/14/13 0415 07/17/13 0335  WBC 7.8  4.8 5.0 4.7 8.9  NEUTROABS  --   --   --   --  6.3  HGB 6.2* 11.4* 10.7* 10.3* 11.5*  HCT 18.7* 33.5* 31.5* 30.2* 34.1*  MCV 98.4 90.3 90.8 91.2 94.7  PLT 96* 123* 153 144* 130*   Cardiac Enzymes: No results found for this basename: CKTOTAL, CKMB, CKMBINDEX, TROPONINI,  in the last 168 hours BNP: No components found with this basename: POCBNP,  CBG:  Recent Labs Lab 07/15/13 1138 07/15/13 1710 07/15/13 2235 07/16/13 0026 07/16/13 0810  GLUCAP 95 87 81 102* 87    Radiological Exams on Admission: Ct Angio Chest Pe W/cm &/or Wo Cm  07/17/2013   CLINICAL DATA:  Tachycardia.  Upper chest pain, shortness of breath.  EXAM: CT ANGIOGRAPHY CHEST WITH CONTRAST  TECHNIQUE: Multidetector CT imaging of the chest was performed using the standard protocol during bolus administration of intravenous contrast. Multiplanar CT image reconstructions and MIPs were obtained to evaluate the vascular anatomy.  CONTRAST:  70mL OMNIPAQUE IOHEXOL 350 MG/ML SOLN  COMPARISON:  DG CHEST 1V PORT dated 07/17/2013; CT ANGIO CHEST W/CM &/OR WO/CM dated 07/11/2013  FINDINGS: No filling defects in the pulmonary arteries to suggest pulmonary emboli. Heart is normal size. Aorta is normal caliber. No dissection.  Moderate left pleural effusion and large right pleural effusion. These are stable since prior study. Compressive atelectasis in the lower lobes bilaterally. No mediastinal, hilar or axillary adenopathy. Chest wall soft tissues are unremarkable. Imaging into the upper abdomen shows no acute findings.  Review of the MIP images confirms the above findings.  IMPRESSION: No evidence of pulmonary embolus.  Continued bilateral effusions, right greater than left, unchanged. Compressive atelectasis in the lower lobes.  No change since  prior study.   Electronically Signed   By: Charlett Nose M.D.   On: 07/17/2013 06:48   Dg Chest Portable 1 View  07/17/2013   CLINICAL DATA:  Abdominal pain, tachycardia.  EXAM: PORTABLE CHEST - 1 VIEW  COMPARISON:  CT ANGIO CHEST W/CM &/OR WO/CM dated 07/11/2013; DG CHEST 1V PORT dated 07/11/2013  FINDINGS: Moderate right pleural effusion, likely slightly increased since prior study. Interval removal of right hemodialysis catheter. Right PICC line is unchanged. Patchy right lung airspace disease and left basilar airspace disease medially, both slightly increased since prior study. No acute bony abnormality.  IMPRESSION: Increasing patchy bilateral airspace disease as above. Increasing moderate right effusion.   Electronically Signed   By: Charlett Nose M.D.   On: 07/17/2013 03:54   Dg Abd Portable 1v  07/17/2013   CLINICAL DATA:  J-tube placement.  EXAM: PORTABLE ABDOMEN - 1 VIEW  COMPARISON:  07/13/2013  FINDINGS: Contrast was injected through the jejunostomy tube. This opacifies small bowel loops in the upper pelvis. No evidence of contrast extravasation.  Slightly prominent bowel loops or superiorly in the abdomen, nonspecific. Prior cholecystectomy. No free air.  IMPRESSION: Jejunostomy tube within upper pelvic small bowel loops. Mildly prominent abdominal small bowel loops which could reflect mild focal ileus although early or partial small bowel obstruction cannot be excluded.   Electronically Signed   By: Charlett Nose M.D.   On: 07/17/2013 03:57    EKG: Independently reviewed. Sinus tachycardia, nonspecific T-wave changes    Time spent:80 minutes Code Status:   Presumptive FULL Family Communication:   No Family at bedside   Doye Montilla, DO  [facility] Pager [phone]  If 7PM-7AM, please contact night-coverage [email or web address] Password [facility] 07/17/2013, 8:39 AM

## 2013-07-17 NOTE — Progress Notes (Signed)
INITIAL NUTRITION ASSESSMENT  DOCUMENTATION CODES Per approved criteria  -Not Applicable   INTERVENTION: Initiate Vital AF 1.2 @ 40 ml/hr via J-tube and increase by 10 ml every 4 hours to goal rate of 60 ml/hr.  At goal rate, tube feeding regimen will provide 1728 kcal, 108 grams of protein, and 1168 ml of H2O.  Recommend SLP evaluation; pt was to remain NPO 2/16 with recommendations for Dys 1 diet Encourage PO intake   NUTRITION DIAGNOSIS: Inadequate oral intake related to poor appetite/dysphagia as evidenced by meal completion <20% and NPO status PTA.   Goal: Pt to meet >/= 90% of their estimated nutrition needs   Monitor:  PO intake, TF tolerance/rate, Weight trends, labs  Reason for Assessment: Consult  53 y.o. female  Admitting Dx: <principal problem not specified>  ASSESSMENT: 53 year old female with a history of seizure disorder, hypertension, peripheral neuropathy, meningoencephalitis of unclear etiology [facility]cardia and dislodged J-tube. The patient was recently discharged from the hospital after a prolonged stay from 06/19/2013 through 07/16/2013 due to bowel obstruction with J-tube revision, bacteremia, UTI, meningoencephalitis, and protein calorie malnutrition. The patient was discharged to her skilled nursing facility with instructions for 10 additional days of ciprofloxacin through her PICC line. In the ED, the patient was noted to be tachycardic as stated above. Abdominal x-ray showed that the J-tube was in the small bowel loops. However there was concern for possible ileus and PSBO could not be excluded.  RD consulted for enteral/ tube feeding initiation and management. Per surgical NP note, pt is S/p Jejunostomy tube placement, revision and IR exchange.  She has a benign abdominal exam. She just had a large soft bowel movement. NP recommends resuming TF at goal rate.  Per paper chart, pt was receiving TF PTA with nothing by mouth except  for medications. Pt is currently on a heart health diet. Per SLP note on 2/16, pt not medically able to initiate PO diet but, recommend Dys 1 textures and thin liquids when able to start PO. Question appropriateness of current diet order.  Lunch tray at bedside at time of visit, about 20% consumed, but pt spit some food out per nursing notes. Pt states she is unsure how much she usually weighs because her weight changes so much.    Nutrition Focused Physical Exam:  Subcutaneous Fat:  Orbital Region: wnl Upper Arm Region: wnl, edema Thoracic and Lumbar Region: NA  Muscle:  Temple Region: wnl Clavicle Bone Region: wnl Clavicle and Acromion Bone Region: wnl Scapular Bone Region: NA Dorsal Hand: mild wasting Patellar Region: wnl Anterior Thigh Region: wnl Posterior Calf Region: wnl, edema  Edema: +2 generalized edema, +3 RUE edema, +2 RLE and LLE edema   Height: Ht Readings from Last 1 Encounters:  07/06/13 5\' 3"  (1.6 m)    Weight: Wt Readings from Last 1 Encounters:  07/17/13 175 lb 14.4 oz (79.788 kg)    Ideal Body Weight: 115 lbs  % Ideal Body Weight: 152%  Wt Readings from Last 10 Encounters:  07/17/13 175 lb 14.4 oz (79.788 kg)  07/16/13 182 lb 12.2 oz (82.9 kg)  07/16/13 182 lb 12.2 oz (82.9 kg)  07/16/13 182 lb 12.2 oz (82.9 kg)  05/17/13 154 lb 12.2 oz (70.2 kg)    Usual Body Weight: unknown  % Usual Body Weight: NA  BMI:  Body mass index is 31.17 kg/(m^2).  Estimated Nutritional Needs: Kcal: 1760-1970 Protein: 85-100 grams Fluid: 2.2 L/day  Skin: +2 generalized edema, +3  RUE edema, +2 RLE and LLE edema; multiple abdominal incisions  Diet Order: Cardiac  EDUCATION NEEDS: -No education needs identified at this time   Intake/Output Summary (Last 24 hours) at 07/17/13 1358 Last data filed at 07/17/13 1341  Gross per 24 hour  Intake   1320 ml  Output      0 ml  Net   1320 ml    Last BM: 2/17, diarrhea  Labs:   Recent Labs Lab  07/12/13 0500  07/14/13 0415 07/15/13 0500 07/15/13 1830 07/16/13 0550 07/17/13 0335  NA 139  < > 146 143  --   --  144  K 4.5  < > 3.1* 2.5* 4.5 3.8 3.7  CL 106  < > 112 109  --   --  108  CO2 22  < > 26 24  --   --  24  BUN 6  < > 3* <3*  --   --  4*  CREATININE 0.51  < > 0.59 0.55  --   --  0.55  CALCIUM 8.3*  < > 7.9* 7.7*  --   --  8.0*  MG 1.8  --  2.0  --   --  1.9  --   GLUCOSE 95  < > 81 101*  --   --  85  < > = values in this interval not displayed.  CBG (last 3)   Recent Labs  07/15/13 2235 07/16/13 0026 07/16/13 0810  GLUCAP 81 102* 87    Scheduled Meds: . atenolol  50 mg Oral BID  . chlorhexidine  15 mL Mouth/Throat BID  . ciprofloxacin  400 mg Intravenous Q12H  . enoxaparin (LOVENOX) injection  40 mg Subcutaneous Q24H  . ferrous sulfate  325 mg Oral BID WC  . folic acid  1 mg Oral Daily  . geriatric multivitamins-minerals  15 mL Oral Daily  . levETIRAcetam  1,000 mg Oral BID  . mirtazapine  30 mg Oral QHS  . pantoprazole sodium  40 mg Per Tube Q1200  . potassium chloride  40 mEq Oral BID  . risperiDONE  0.5 mg Oral BID  . thiamine  100 mg Oral Daily  . valproic acid  500 mg Oral TID    Continuous Infusions:   Past Medical History  Diagnosis Date  . Peripheral neuropathy   . Fibromyalgia   . Hypertension   . Anxiety   . Catatonia   . Seizures     Past Surgical History  Procedure Laterality Date  . Gastric bypass    . Abdominal hysterectomy    . Jejunostomy N/A 06/25/2013    Procedure:  OPEN JEJUNOSTOMY FEEDING TUBE ;  Surgeon: Cherylynn Ridges, MD;  Location: [facility] OR;  Service: General;  Laterality: N/A;  . Laparotomy N/A 07/02/2013    Procedure: EXPLORATORY LAPAROTOMY with revision of jejunostomy feeding tube.;  Surgeon: Cherylynn Ridges, MD;  Location: [facility] OR;  Service: General;  Laterality: N/A;  . Bowel resection N/A 07/02/2013    Procedure: SMALL BOWEL RESECTION;  Surgeon: Cherylynn Ridges, MD;  Location: [facility] OR;  Service: General;  Laterality: N/A;     Ian Malkin RD, LDN Inpatient Clinical Dietitian Pager: [phone] After Hours Pager: [phone]

## 2013-07-17 NOTE — ED Notes (Signed)
Lab called to inquire about blood work resulted.

## 2013-07-17 NOTE — Progress Notes (Signed)
Orthopedic Tech Progress Note Patient Details:  Joan [name] 02/28/1961 [id number]  Ortho Devices Type of Ortho Device: Abdominal binder Ortho Device/Splint Interventions: Application   Cammer, [name] 07/17/2013, 10:14 AM

## 2013-07-17 NOTE — ED Notes (Signed)
Pt had large liquid/grainy yellow stool-- placed on isolation precautions. ADL done-- blister noted on lower right thigh, reddened areas to buttocks/perineal area

## 2013-07-17 NOTE — ED Notes (Signed)
CT called and informed pt's PICC line can be used for access for CT angiogram.

## 2013-07-17 NOTE — ED Notes (Signed)
MD informed of pt's BP

## 2013-07-17 NOTE — ED Notes (Signed)
Pt soiled. Pt's feces bright yellow in color, thick, and chunky. Pt presents with skin breakdown in her vaginal area and on her buttocks. MD informed.

## 2013-07-17 NOTE — Consult Note (Signed)
Reason for Consult: SBO versus ileus Referring Physician: Dr. Shanon Brow Tat   HPI: Joan [name] is a 53 year old nursing home resident with a past medical history of seizure disorder, ESRD, htn, anxiety, encephalopathy, PCM, HTN who was recently discharged from [facility] following a prolonged hospitalization course. We were asked to place a feeding tube due to poor oral intake, history of gastrectomy and dysphagia.  She had a Jejunostomy tube placed which subsequently needed revision.  The feeding tube then had to be exchanged by interventional radiology due to being clogged.  She presents to [facility] today with tachycardia.  She had a abdominal XR which suggested a possible SBO versus ileus and therefore we have been asked to see the patient.  When i arrived to the patients room, she had a large soft incontinent bowel movement.  Nursing was asked to see the patient.  The patient was unable to provide any history.  The J tube was clamped.     Past Medical History  Diagnosis Date  . Peripheral neuropathy   . Fibromyalgia   . Hypertension   . Anxiety   . Catatonia   . Seizures     Past Surgical History  Procedure Laterality Date  . Gastric bypass    . Abdominal hysterectomy    . Jejunostomy N/A 06/25/2013    Procedure:  OPEN JEJUNOSTOMY FEEDING TUBE ;  Surgeon: Gwenyth Ober, MD;  Location: [place];  Service: General;  Laterality: N/A;  . Laparotomy N/A 07/02/2013    Procedure: EXPLORATORY LAPAROTOMY with revision of jejunostomy feeding tube.;  Surgeon: Gwenyth Ober, MD;  Location: [place];  Service: General;  Laterality: N/A;  . Bowel resection N/A 07/02/2013    Procedure: SMALL BOWEL RESECTION;  Surgeon: Gwenyth Ober, MD;  Location: [facility] OR;  Service: General;  Laterality: N/A;    Family History  Problem Relation Age of Onset  . Hypertension Mother   . Diabetes Father     Social History:  reports that she has never smoked. She does not have any smokeless tobacco history on file. She reports that she does  not drink alcohol. Her drug history is not on file.  Allergies:  Allergies  Allergen Reactions  . Morphine And Related Other (See Comments)    REACTION: Causes pain  . Paroxetine Hcl Other (See Comments)    REACTION:  unknown  . Penicillins Other (See Comments)    REACTION: Makes her body hurt.  . Sertraline Hcl Other (See Comments)    REACTION:  unknown    Medications:  No current facility-administered medications on file prior to encounter.   Current Outpatient Prescriptions on File Prior to Encounter  Medication Sig Dispense Refill  . acetaminophen (TYLENOL) 325 MG tablet Take 650 mg by mouth every 4 (four) hours as needed for mild pain or fever.      [name] atenolol (TENORMIN) 100 MG tablet Take 1 tablet (100 mg total) by mouth 2 (two) times daily.      . chlorhexidine (PERIDEX) 0.12 % solution Use as directed 15 mLs in the mouth or throat 2 (two) times daily.      . ciprofloxacin (CIPRO) 400 MG/200ML SOLN Inject 200 mLs (400 mg total) into the vein every 12 (twelve) hours. For 10 more days  2000 mL  1  . ferrous sulfate 325 (65 FE) MG tablet Take 1 tablet (325 mg total) by mouth 2 (two) times daily with a meal.    3  . folic acid (FOLVITE)  1 MG tablet Take 1 mg by mouth daily.      [name] levETIRAcetam (KEPPRA) 1000 MG tablet Take 1,000 mg by mouth 2 (two) times daily.      [name] loperamide (IMODIUM) 2 MG capsule Take 2 mg by mouth as needed for diarrhea or loose stools.      . megestrol (MEGACE) 400 MG/10ML suspension Take 400 mg by mouth daily.      . mirtazapine (REMERON SOL-TAB) 30 MG disintegrating tablet Take 30 mg by mouth at bedtime.      . Multiple Vitamins-Minerals (ELDERTONIC PO) Take 15 mLs by mouth 2 (two) times daily.      . Nutritional Supplements (FEEDING SUPPLEMENT, VITAL AF 1.2 CAL,) LIQD Place 1,000 mLs into feeding tube continuous.      . Nutritional Supplements (NUTRITIONAL SHAKE) LIQD Take 1 Can by mouth 3 (three) times daily before meals. Mighty shakes.      [name]  ondansetron (ZOFRAN) 4 MG tablet Take 1 tablet (4 mg total) by mouth every 8 (eight) hours as needed for nausea or vomiting.  20 tablet  0  . pantoprazole sodium (PROTONIX) 40 mg/20 mL PACK Place 20 mLs (40 mg total) into feeding tube daily at 12 noon.  30 each    . potassium chloride 20 MEQ/15ML (10%) solution Take 30 mLs (40 mEq total) by mouth 2 (two) times daily.  500 mL  0  . risperiDONE (RISPERDAL M-TABS) 0.5 MG disintegrating tablet Take 1 tablet (0.5 mg total) by mouth 2 (two) times daily.      [name] thiamine (VITAMIN B-1) 100 MG tablet Take 100 mg by mouth daily.      [name] valproic acid (DEPAKENE) 250 MG capsule Take 2 capsules (500 mg total) by mouth 3 (three) times daily. Take 551m in the morning, 2575min the afternoon, and 50058mt bedtime.      . Water For Irrigation, Sterile (FREE WATER) SOLN Place 140 mLs into feeding tube 4 (four) times daily.         Results for orders placed during the hospital encounter of 07/17/13 (from the past 48 hour(s))  CBC WITH DIFFERENTIAL     Status: Abnormal   Collection Time    07/17/13  3:35 AM      Result Value Ref Range   WBC 8.9  4.0 - 10.5 K/uL   RBC 3.60 (*) 3.87 - 5.11 MIL/uL   Hemoglobin 11.5 (*) 12.0 - 15.0 g/dL   HCT 34.1 (*) 36.0 - 46.0 %   MCV 94.7  78.0 - 100.0 fL   MCH 31.9  26.0 - 34.0 pg   MCHC 33.7  30.0 - 36.0 g/dL   RDW 18.4 (*) 11.5 - 15.5 %   Platelets 130 (*) 150 - 400 K/uL   Neutrophils Relative % 70  43 - 77 %   Neutro Abs 6.3  1.7 - 7.7 K/uL   Lymphocytes Relative 15  12 - 46 %   Lymphs Abs 1.3  0.7 - 4.0 K/uL   Monocytes Relative 15 (*) 3 - 12 %   Monocytes Absolute 1.3 (*) 0.1 - 1.0 K/uL   Eosinophils Relative 0  0 - 5 %   Eosinophils Absolute 0.0  0.0 - 0.7 K/uL   Basophils Relative 0  0 - 1 %   Basophils Absolute 0.0  0.0 - 0.1 K/uL  COMPREHENSIVE METABOLIC PANEL     Status: Abnormal   Collection Time    07/17/13  3:35 AM  Result Value Ref Range   Sodium 144  137 - 147 mEq/L   Potassium 3.7  3.7 - 5.3  mEq/L   Chloride 108  96 - 112 mEq/L   CO2 24  19 - 32 mEq/L   Glucose, Bld 85  70 - 99 mg/dL   BUN 4 (*) 6 - 23 mg/dL   Creatinine, Ser 0.55  0.50 - 1.10 mg/dL   Calcium 8.0 (*) 8.4 - 10.5 mg/dL   Total Protein 4.9 (*) 6.0 - 8.3 g/dL   Albumin 2.3 (*) 3.5 - 5.2 g/dL   AST 21  0 - 37 U/L   ALT 10  0 - 35 U/L   Alkaline Phosphatase 68  39 - 117 U/L   Total Bilirubin 0.5  0.3 - 1.2 mg/dL   GFR calc non Af Amer >90  >90 mL/min   GFR calc Af Amer >90  >90 mL/min   Comment: (NOTE)     The eGFR has been calculated using the CKD EPI equation.     This calculation has not been validated in all clinical situations.     eGFR's persistently <90 mL/min signify possible Chronic Kidney     Disease.  CG4 I-STAT (LACTIC ACID)     Status: None   Collection Time    07/17/13  3:53 AM      Result Value Ref Range   Lactic Acid, Venous 1.02  0.5 - 2.2 mmol/L  URINALYSIS, ROUTINE W REFLEX MICROSCOPIC     Status: Abnormal   Collection Time    07/17/13  4:27 AM      Result Value Ref Range   Color, Urine YELLOW  YELLOW   APPearance CLEAR  CLEAR   Specific Gravity, Urine 1.020  1.005 - 1.030   pH 5.0  5.0 - 8.0   Glucose, UA NEGATIVE  NEGATIVE mg/dL   Hgb urine dipstick NEGATIVE  NEGATIVE   Bilirubin Urine NEGATIVE  NEGATIVE   Ketones, ur NEGATIVE  NEGATIVE mg/dL   Protein, ur NEGATIVE  NEGATIVE mg/dL   Urobilinogen, UA 1.0  0.0 - 1.0 mg/dL   Nitrite NEGATIVE  NEGATIVE   Leukocytes, UA TRACE (*) NEGATIVE  URINE MICROSCOPIC-ADD ON     Status: Abnormal   Collection Time    07/17/13  4:27 AM      Result Value Ref Range   Squamous Epithelial / LPF RARE  RARE   WBC, UA 3-6  <3 WBC/hpf   RBC / HPF 0-2  <3 RBC/hpf   Bacteria, UA RARE  RARE   Crystals CA OXALATE CRYSTALS (*) NEGATIVE   Urine-Other MUCOUS PRESENT     Comment: LESS THAN 10 mL OF URINE SUBMITTED    Ct Angio Chest Pe W/cm &/or Wo Cm  07/17/2013   CLINICAL DATA:  Tachycardia.  Upper chest pain, shortness of breath.  EXAM: CT  ANGIOGRAPHY CHEST WITH CONTRAST  TECHNIQUE: Multidetector CT imaging of the chest was performed using the standard protocol during bolus administration of intravenous contrast. Multiplanar CT image reconstructions and MIPs were obtained to evaluate the vascular anatomy.  CONTRAST:  61m OMNIPAQUE IOHEXOL 350 MG/ML SOLN  COMPARISON:  DG CHEST 1V PORT dated 07/17/2013; CT ANGIO CHEST W/CM &/OR WO/CM dated 07/11/2013  FINDINGS: No filling defects in the pulmonary arteries to suggest pulmonary emboli. Heart is normal size. Aorta is normal caliber. No dissection.  Moderate left pleural effusion and large right pleural effusion. These are stable since prior study. Compressive atelectasis in the lower lobes bilaterally.  No mediastinal, hilar or axillary adenopathy. Chest wall soft tissues are unremarkable. Imaging into the upper abdomen shows no acute findings.  Review of the MIP images confirms the above findings.  IMPRESSION: No evidence of pulmonary embolus.  Continued bilateral effusions, right greater than left, unchanged. Compressive atelectasis in the lower lobes.  No change since prior study.   Electronically Signed   By: Rolm Baptise M.D.   On: 07/17/2013 06:48   Dg Chest Portable 1 View  07/17/2013   CLINICAL DATA:  Abdominal pain, tachycardia.  EXAM: PORTABLE CHEST - 1 VIEW  COMPARISON:  CT ANGIO CHEST W/CM &/OR WO/CM dated 07/11/2013; DG CHEST 1V PORT dated 07/11/2013  FINDINGS: Moderate right pleural effusion, likely slightly increased since prior study. Interval removal of right hemodialysis catheter. Right PICC line is unchanged. Patchy right lung airspace disease and left basilar airspace disease medially, both slightly increased since prior study. No acute bony abnormality.  IMPRESSION: Increasing patchy bilateral airspace disease as above. Increasing moderate right effusion.   Electronically Signed   By: Rolm Baptise M.D.   On: 07/17/2013 03:54   Dg Abd Portable 1v  07/17/2013   CLINICAL DATA:  J-tube  placement.  EXAM: PORTABLE ABDOMEN - 1 VIEW  COMPARISON:  07/13/2013  FINDINGS: Contrast was injected through the jejunostomy tube. This opacifies small bowel loops in the upper pelvis. No evidence of contrast extravasation.  Slightly prominent bowel loops or superiorly in the abdomen, nonspecific. Prior cholecystectomy. No free air.  IMPRESSION: Jejunostomy tube within upper pelvic small bowel loops. Mildly prominent abdominal small bowel loops which could reflect mild focal ileus although early or partial small bowel obstruction cannot be excluded.   Electronically Signed   By: Rolm Baptise M.D.   On: 07/17/2013 03:57    Review of Systems  Unable to perform ROS  Blood pressure 134/92, pulse 125, temperature 97.8 F (36.6 C), temperature source Oral, resp. rate 23, SpO2 100.00%. Physical Exam  Constitutional: She appears well-developed and well-nourished. No distress.  Cardiovascular: Regular rhythm, normal heart sounds and intact distal pulses.  Exam reveals no gallop and no friction rub.   No murmur heard. tachycardic  Respiratory: Breath sounds normal.  GI: Soft. Bowel sounds are normal. She exhibits no distension and no mass. There is no tenderness. There is no rebound and no guarding.  J tube-stitch in place, will need to be reinforced.  Abdominal binder in place.    Skin: She is not diaphoretic.    Assessment/Plan: Questionable ileus versus SBO on imaging S/p Jejunostomy tube placement, revision and IR exchange She has a benign abdominal exam.  She just had a large soft bowel movement.  I am not able to obtain additional information from the patient due to mental status.  Would recommend resuming tube feeding at goal rate.  Residuals do not need to be checked.  ABSOLUTELY NO CRUSHED MEDICATION PER TUBE.  270m TID water flushes per tube. May give liquid medication via J tube.   I will secure the J tube once more today.  Continue with abdominal binder to prevent the patient from pulling  the feeding tube out.  Thank you for the consult.  Sure will sign off.  Please call with questions or concerns.    Jaycub Noorani ANP-BC 07/17/2013, 11:14 AM

## 2013-07-17 NOTE — ED Notes (Addendum)
[facility]  PICC line in place to right arm.  Pt presents with tachycardia 130's and hyperventilation that occurred tonight, EMS reports pt was anxious about transport upon their arrival.  Facility reports pt g-tube was on a pump that fell over tonight and dislodged tube, tube in place upon arrival to ED, pt reports abdominal tenderness around site.

## 2013-07-17 NOTE — Progress Notes (Signed)
*  PRELIMINARY RESULTS* Vascular Ultrasound Right upper extremity venous duplex has been completed.  Preliminary findings: No evidence of DVT or SVT.   Farrel DemarkJill Eunice, RDMS, RVT  07/17/2013, 5:56 PM

## 2013-07-17 NOTE — Progress Notes (Signed)
Pt transported from ED to unit, alert and oriented to self, IV team notified about PICC, NAD noted, will continue to monitor

## 2013-07-18 DIAGNOSIS — I10 Essential (primary) hypertension: Secondary | ICD-10-CM

## 2013-07-18 DIAGNOSIS — K9403 Colostomy malfunction: Secondary | ICD-10-CM

## 2013-07-18 DIAGNOSIS — K9413 Enterostomy malfunction: Secondary | ICD-10-CM

## 2013-07-18 LAB — CBC
HCT: 31.9 % — ABNORMAL LOW (ref 36.0–46.0)
Hemoglobin: 10.8 g/dL — ABNORMAL LOW (ref 12.0–15.0)
MCH: 32.1 pg (ref 26.0–34.0)
MCHC: 33.9 g/dL (ref 30.0–36.0)
MCV: 94.9 fL (ref 78.0–100.0)
PLATELETS: 143 10*3/uL — AB (ref 150–400)
RBC: 3.36 MIL/uL — AB (ref 3.87–5.11)
RDW: 19.1 % — ABNORMAL HIGH (ref 11.5–15.5)
WBC: 7.7 10*3/uL (ref 4.0–10.5)

## 2013-07-18 LAB — GLUCOSE, CAPILLARY
Glucose-Capillary: 107 mg/dL — ABNORMAL HIGH (ref 70–99)
Glucose-Capillary: 109 mg/dL — ABNORMAL HIGH (ref 70–99)
Glucose-Capillary: 79 mg/dL (ref 70–99)
Glucose-Capillary: 75 mg/dL (ref 70–99)

## 2013-07-18 LAB — BASIC METABOLIC PANEL
BUN: 3 mg/dL — AB (ref 6–23)
CALCIUM: 7.9 mg/dL — AB (ref 8.4–10.5)
CO2: 26 mEq/L (ref 19–32)
Chloride: 106 mEq/L (ref 96–112)
Creatinine, Ser: 0.54 mg/dL (ref 0.50–1.10)
GFR calc Af Amer: >90 mL/min (ref >90)
Glucose, Bld: 86 mg/dL (ref 70–99)
Potassium: 3.7 mEq/L (ref 3.7–5.3)
SODIUM: 142 meq/L (ref 137–147)

## 2013-07-18 LAB — CULTURE, BLOOD (ROUTINE X 2)
CULTURE: NO GROWTH
Culture: NO GROWTH

## 2013-07-18 LAB — URINE CULTURE
Colony Count: NO GROWTH
Culture: NO GROWTH
Special Requests: NORMAL

## 2013-07-18 LAB — FOLATE RBC: RBC Folate: 693 ng/mL — ABNORMAL HIGH (ref >280)

## 2013-07-18 LAB — VITAMIN B12: Vitamin B-12: 667 pg/mL (ref 211–911)

## 2013-07-18 LAB — TSH: TSH: 3.282 u[IU]/mL (ref 0.350–4.500)

## 2013-07-18 MED ORDER — SODIUM CHLORIDE 0.9 % IJ SOLN
10.0000 mL | INTRAMUSCULAR | Status: DC | PRN
  Administered 2013-07-19 – 2013-07-22 (×3): 10 mL
  Administered 2013-07-23: 30 mL
  Administered 2013-07-23: 20 mL

## 2013-07-18 MED ORDER — VITAL AF 1.2 CAL PO LIQD: 1000.0000 mL | ORAL | Status: DC

## 2013-07-18 MED ORDER — FREE WATER: 200.0000 mL | Freq: Three times a day (TID) | Status: DC

## 2013-07-18 MED ORDER — FREE WATER
200.0000 mL | Freq: Three times a day (TID) | Status: DC
  Administered 2013-07-18 – 2013-07-24 (×15): 200 mL

## 2013-07-18 MED ORDER — SODIUM CHLORIDE 0.9 % IJ SOLN
10.0000 mL | Freq: Two times a day (BID) | INTRAMUSCULAR | Status: DC
  Administered 2013-07-18: 20 mL

## 2013-07-18 MED ORDER — IOHEXOL 300 MG/ML  SOLN: 25.0000 mL | INTRAMUSCULAR | Status: AC

## 2013-07-18 MED ORDER — CIPROFLOXACIN IN D5W 400 MG/200ML IV SOLN: 400.0000 mg | Freq: Two times a day (BID) | INTRAVENOUS | Status: DC

## 2013-07-18 NOTE — Discharge Summary (Signed)
Physician Discharge Summary  Joan [name] ZOX:[id number] DOB: July 23, 1960 DOA: 07/17/2013  PCP: [name], MD  Admit date: 07/17/2013 Discharge date: 07/19/13 Recommendations for Outpatient Follow-up:  1. Pt will need to follow up with PCP in 2 weeks post discharge 2. Please obtain BMP to evaluate electrolytes and kidney function 3. Please also check CBC to evaluate Hg and Hct levels 4. Continue enteral feedings-- Vital AF 1.2 @ 60cc/hr 5. Free water 200cc per J-tube tid 6. Pt may have dysphagia 1 diet with thin liquids by mouth 7. Ciprofloxacin 400 mg IV every 12 hours with the last dose on 07/26/2013 8. Discontinue PICC line after last dose of ciprofloxacin   Discharge Diagnoses:  Active Problems:   Acute encephalopathy   Convulsions/seizures   Essential hypertension, benign   Nausea with vomiting   Malfunction of jejunostomy tube   SIRS (systemic inflammatory response syndrome) Sinus tachycardia/SIRS  -Tachycardia improving with the restart of atenolol-->the patient was switched to metoprolol tartrate 50 mg twice a day -According to the medical record this has been a chronic problem for the last several weeks which showed some mild improvement after beta blocker was started on the last admission  -This is likely due to her acute medical illness  -Right upper extremity duplex negative for DVT  -appreciate Dr. Andrey Campanile, general surgery assistance  -abdominal binder at all times  -restart enteral feeds at 60cc/hr  -Continue ciprofloxacin IV--plan to continue until 07/26/2013 if repeat blood cultures are negative--this will signify 14 days of antibiotic therapy from the last negative culture  -Blood cultures--negative  -duplex RUE to r/o DVT  -CT angiogram chest is negative for pulmonary embolus  -Urinalysis negative for pyuria Acute encephalopathy  -Unclear what the patient's baseline is; however after speaking to the patient's son the patient is near her baseline.  The  patient's mentation significantly improved from the time of admission. 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 Pt received diatech catheter on 06/20/2013 placement and received and finished plasmapheresis   -speech to eval swallowing--dysphagia 1 diet with thin liquids  -Check serum B12--667  - RBC folate--693 - RPR--NR  -ammonia--17  -Updated information revealed that the patient did not receive any medications during her short stay [name]--question seizure with postictal state as reason for patient's acute encephalopathy  -The patient has outpatient neurology appointment on 08/23/2013 which she should keep Hx of Convulsions/Seizures  -Pt has a PMH of seizures and catatonia.  - 06/20/13 MRI of the brain showed resolution of thalamic signal abnormality  -continue current AEDs  Hx of Anxiety  -Continue pt on Remeron  -Patient appears to have had a psychiatric history  -Appears to be stable at this time  -Psych prescribed Risperdal 0.5 mg bid previous admission  HTN  -restart atenolol at half home dose and titrate as needed  Malnutrition with history of Gastric Bypass  - encourage PO intake with TF  -J-tube resutured and stable  -Gen Surg consulted, J-tube placed and underwent revision of j tube with exploratory laparotomy on 2/3 and small bowel resection.  -The patient will continue on her enteral feeding Vital AF 1.2@60cc /hr--which the patient tolerated without any further vomiting during the hospitalization -She is able to take a dysphagia 1 diet by mouth which I encouraged the patient to do Bacteremia  -On her previous admission, the patient had Klebsiella pneumoniae bacteremia for which she was discharged on ciprofloxacin  -She was continued on ciprofloxacin throughout her hospitalization  -  Continue ciprofloxacin as discussed above  -Repeat blood cultures have remained negative.  Thrombocytopenia  -No acute  bleeding monitor  -chronic  Pleural effusions  -Currently no respiratory distress  -Oxygen saturation 97-100% on room air  -Remains stable when compared to prior CT angiogram of the chest on 07/11/2013  Loose Stools  -Patient had large volume yellow loose stool in the ED  -C. difficile PCR negative  Family Communication: Pt at beside  Disposition Plan: SNF 07/19/13 if stable   Discharge Condition: Stable  Disposition:  skilled nursing facility  Diet: Dysphagia 1 diet with thin liquids Wt Readings from Last 3 Encounters:  07/19/13 80.831 kg (178 lb 3.2 oz)  07/16/13 82.9 kg (182 lb 12.2 oz)  07/16/13 82.9 kg (182 lb 12.2 oz)    History of present illness:   53 year old female with a history of seizure disorder, hypertension, peripheral neuropathy, meningoencephalitis of unclear etiology presented from [facility] with tachycardia and dislodged J-tube. The patient was recently discharged from the hospital after a prolonged stay from 06/19/2013 through 07/16/2013 due to bowel obstruction with J-tube revision, bacteremia, UTI, meningoencephalitis, and protein calorie malnutrition. The patient was discharged to her skilled nursing facility with instructions for 10 additional days of ciprofloxacin through her PICC line. The patient is pleasantly confused and is unable to provide any history at this time. History is obtained from review of the medical records and speaking with EDP. The patient was noted to be tachycardic with a heart rate in the 130-140, but the patient was afebrile and hemodynamically stable. The patient endorsed vomiting, but not clear on reliability. The patient also had a recent hospitalization from 05/11/2013 through 05/27/2003 came during which time the patient was diagnosed with meningoencephalitis, but CSF titers were negative. The patient was confused and hallucinating at that time and there was concern for seizure. The patient was started on antiepileptic medications.  She was readmitted in January 2015 and repeat CSF titers were negative for viral bacterial causes. In the ED, the patient was noted to be tachycardic as stated above. Abdominal x-ray showed that the J-tube was in the small bowel loops. However there was concern for possible ileus and PSBO could not be excluded. CT angiogram of the chest was negative for pulmonary embolus but revealed a stable bilateral pleural effusions, right greater than left. CBC showed mild thrombocytopenia with platelets 130,000. VBC was 8.9. BMP was unremarkable. Urinalysis did not show any pyuria. Lactic acid was 1.02. EKG showed sinus tachycardia with nonspecific T-wave changes. During admission, the patient's mentation significantly improved. Surgery was consulted and the patient's jejunostomy tube was resecured by general surgery. 2 feeds were restarted and the patient tolerated them without difficulty. Workup for her acute encephalopathy was essentially negative as discussed above. Later during the hospitalization, the information was obtained from her skilled nursing facility which stated that the patient had not been started on any of her medications during her short stay. Therefore it was thought that the patient may have had a seizure resulting in a postictal state contributing to her encephalopathy.     Discharge Exam: Filed Vitals:   07/19/13 0537  BP: 123/99  Pulse: 94  Temp: 98 F (36.7 C)  Resp: 20   Filed Vitals:   07/18/13 1310 07/18/13 2053 07/19/13 0224 07/19/13 0537  BP: 105/57 147/89 156/95 123/99  Pulse: 97 60 90 94  Temp: 98.1 F (36.7 C) 98 F (36.7 C) 97.9 F (36.6 C) 98 F (36.7 C)  TempSrc: Oral Oral Oral  Oral  Resp: 22 20 21 20   Weight:    80.831 kg (178 lb 3.2 oz)  SpO2:  92% 93% 91%   General: Awake and alert, NAD, pleasant, cooperative Cardiovascular: RRR, no rub, no gallop, no S3 Respiratory: Diminished breath sounds at the bases but clear to auscultation otherwise. No wheezing.  Good air movement.  Abdomen:soft, nontender, nondistended, positive bowel sounds; jejunostomy tube intact anchored with sutures. No induration or drainage. Extremities: 1+LE edema, No lymphangitis, no petechiae  Discharge Instructions      Discharge Orders   Future Appointments Provider Department Dept Phone   08/23/2013 4:00 PM Ronal Fear, NP [facility] [phone]   Future Orders Complete By Expires   Increase activity slowly  As directed        Medication List    STOP taking these medications       ciprofloxacin 400 MG/40ML Soln injection  Commonly known as:  CIPRO     potassium chloride 20 MEQ/15ML (10%) solution      TAKE these medications       acetaminophen 325 MG tablet  Commonly known as:  TYLENOL  Take 650 mg by mouth every 4 (four) hours as needed for mild pain or fever.     chlorhexidine 0.12 % solution  Commonly known as:  PERIDEX  Use as directed 15 mLs in the mouth or throat 2 (two) times daily.     ciprofloxacin 400 MG/200ML Soln  Commonly known as:  CIPRO  Inject 200 mLs (400 mg total) into the vein every 12 (twelve) hours. Stop after last dose on 07/26/13     feeding supplement (VITAL AF 1.2 CAL) Liqd  Place 1,000 mLs into feeding tube continuous.     ferrous sulfate 325 (65 FE) MG tablet  Take 1 tablet (325 mg total) by mouth 2 (two) times daily with a meal.     free water Soln  Place 200 mLs into feeding tube every 8 (eight) hours.     free water Soln  Place 200 mLs into feeding tube every 8 (eight) hours.     levETIRAcetam 1000 MG tablet  Commonly known as:  KEPPRA  Take 1,000 mg by mouth 2 (two) times daily.     loperamide 2 MG capsule  Commonly known as:  IMODIUM  Take 4 mg by mouth 3 (three) times daily as needed for diarrhea or loose stools.     metoprolol 50 MG tablet  Commonly known as:  LOPRESSOR  Take 1 tablet (50 mg total) by mouth 2 (two) times daily.     mirtazapine 15 MG disintegrating tablet   Commonly known as:  REMERON SOL-TAB  Take 15 mg by mouth at bedtime.     ondansetron 4 MG tablet  Commonly known as:  ZOFRAN  Take 1 tablet (4 mg total) by mouth every 8 (eight) hours as needed for nausea or vomiting.     pantoprazole sodium 40 mg/20 mL Pack  Commonly known as:  PROTONIX  Place 20 mLs (40 mg total) into feeding tube daily at 12 noon.     risperiDONE 0.5 MG disintegrating tablet  Commonly known as:  RISPERDAL M-TABS  Take 1 tablet (0.5 mg total) by mouth 2 (two) times daily.     valproic acid 250 MG capsule  Commonly known as:  DEPAKENE  Take 2 capsules (500 mg total) by mouth 3 (three) times daily.     Valproic Acid 250 MG/5ML Syrp syrup  Commonly known as:  DEPAKENE  Take 500 mg by mouth 3 (three) times daily.         The results of significant diagnostics from this hospitalization (including imaging, microbiology, ancillary and laboratory) are listed below for reference.    Significant Diagnostic Studies: Ct Abdomen Pelvis Wo Contrast  07/10/2013   CLINICAL DATA:  Sepsis question intra-abdominal source  EXAM: CT ABDOMEN AND PELVIS WITHOUT CONTRAST  TECHNIQUE: Multidetector CT imaging of the abdomen and pelvis was performed following the standard protocol without intravenous contrast. Sagittal and coronal MPR images reconstructed from axial data set. GI contrast administered  COMPARISON:  CT pelvis 05/17/2013; CT abdomen and pelvis 06/02/2011  FINDINGS: Small moderate-sized right pleural effusion with basilar atelectasis.  Minimal atelectasis left base.  Gallbladder surgically absent.  Prior gastric surgery.  Jejunostomy tube left mid abdomen, through which GI contrast was administered.  Dilated small bowel loops in the upper abdomen terminating at an anastomotic staple line in the left mid abdomen most compatible with small bowel obstruction ; the lack of dilatation of the remaining small bowel and colon makes ileus much less likely.  Small bowel measures up to 4.8  cm transverse mild bowel wall thickening of the dilated loops.  Distal small bowel is decompressed and unremarkable.  Small foci of gas in the left mid abdomen into the upper left pelvis appear to be within several small bowel loops which are nondistended and no definite free intraperitoneal air is identified.  Decompressed colon containing contrast and a rectal tube.  Unremarkable bladder and ovaries with surgical absence of uterus.  Small amount of free pelvic fluid.  Diffuse soft tissue edema/ anasarca.  No mass, adenopathy, or hernia.  Osseous structures unremarkable.  IMPRESSION: Dilated proximal small bowel loops proximal to the jejunostomy site most consistent with small bowel obstruction, associated with mild bowel wall thickening and duodenal dilatation.  Decompressed distal small bowel and colon.  Small amount of nonspecific free pelvic fluid.   Electronically Signed   By: Ulyses Southward M.D.   On: 07/10/2013 17:38   Dg Abd 1 View  06/27/2013   CLINICAL DATA:  Nausea, abdominal pain, vomiting  EXAM: ABDOMEN - 1 VIEW  COMPARISON:  None.  FINDINGS: There is a surgical drain present in the mid abdomen. There are surgical stated in the right paramedian abdominal wall. There is a small amount of contrast scattered throughout the colon. There is no bowel dilatation to suggest obstruction. There is no evidence of pneumoperitoneum, portal venous gas or pneumatosis. There are no pathologic calcifications along the expected course of the ureters.The osseous structures are unremarkable.  IMPRESSION: Nonobstructive bowel gas pattern.   Electronically Signed   By: Elige Ko   On: 06/27/2013 09:34   Ct Chest W Contrast  06/21/2013   CLINICAL DATA:  Altered mental status.  Evaluate for malignancy.  EXAM: CT CHEST WITH CONTRAST  TECHNIQUE: Multidetector CT imaging of the chest was performed during intravenous contrast administration.  CONTRAST:  75mL OMNIPAQUE IOHEXOL 300 MG/ML  SOLN  COMPARISON:  Chest x-ray dated  05/11/2013  FINDINGS: Heart size and pulmonary vascularity are normal. There is no hilar or mediastinal adenopathy. The lungs are clear. No mass lesions, infiltrates, or effusions. Soft tissues of the chest are normal. Thyroid gland is normal. No acute osseous abnormality. Osteophytes fuse the thoracic spine from T6 through T10.  IMPRESSION: No evidence of malignancy or other significant abnormality of the chest.   Electronically Signed   By: Geanie Cooley M.D.   On:  06/21/2013 17:13   Ct Angio Chest Pe W/cm &/or Wo Cm  07/17/2013   CLINICAL DATA:  Tachycardia.  Upper chest pain, shortness of breath.  EXAM: CT ANGIOGRAPHY CHEST WITH CONTRAST  TECHNIQUE: Multidetector CT imaging of the chest was performed using the standard protocol during bolus administration of intravenous contrast. Multiplanar CT image reconstructions and MIPs were obtained to evaluate the vascular anatomy.  CONTRAST:  70mL OMNIPAQUE IOHEXOL 350 MG/ML SOLN  COMPARISON:  DG CHEST 1V PORT dated 07/17/2013; CT ANGIO CHEST W/CM &/OR WO/CM dated 07/11/2013  FINDINGS: No filling defects in the pulmonary arteries to suggest pulmonary emboli. Heart is normal size. Aorta is normal caliber. No dissection.  Moderate left pleural effusion and large right pleural effusion. These are stable since prior study. Compressive atelectasis in the lower lobes bilaterally. No mediastinal, hilar or axillary adenopathy. Chest wall soft tissues are unremarkable. Imaging into the upper abdomen shows no acute findings.  Review of the MIP images confirms the above findings.  IMPRESSION: No evidence of pulmonary embolus.  Continued bilateral effusions, right greater than left, unchanged. Compressive atelectasis in the lower lobes.  No change since prior study.   Electronically Signed   By: Charlett Nose M.D.   On: 07/17/2013 06:48   Ct Angio Chest Pe W/cm &/or Wo Cm  07/11/2013   CLINICAL DATA:  Altered mental status, tachycardia, chest pain and shortness breath  EXAM: CT  ANGIOGRAPHY CHEST WITH CONTRAST  TECHNIQUE: Multidetector CT imaging of the chest was performed using the standard protocol during bolus administration of intravenous contrast. Multiplanar CT image reconstructions and MIPs were obtained to evaluate the vascular anatomy.  CONTRAST:  OMNIPAQUE IOHEXOL 350 MG/ML SOLN  COMPARISON:  DG CHEST 1V PORT dated 07/11/2013; CT ABD/PELV WO CM dated 07/10/2013; CT CHEST W/CM dated 06/21/2013  FINDINGS: There are no filling defects within the pulmonary arteries to suggest acute pulmonary embolism. Acute findings of the aorta or great vessels. No pericardial fluid.  There are bilateral pleural effusions, moderate to large on the right and small on the left. There is associated right lower lobe passive atelectasis.  Limited view of the upper abdomen is unremarkable. Limited view of the skeleton demonstrates degenerative spurring.  Review of the MIP images confirms the above findings.  IMPRESSION: 1. No evidence acute pulmonary embolism. 2. Moderate to large right pleural effusion with associated passive atelectasis. 3. No pericardial fluid.   Electronically Signed   By: Genevive Bi M.D.   On: 07/11/2013 17:53   Mr Laqueta Jean ZO Contrast  06/20/2013   CLINICAL DATA:  Altered mental status. Possible autoimmune encephalitis.  EXAM: MRI HEAD WITHOUT AND WITH CONTRAST  TECHNIQUE: Multiplanar, multiecho pulse sequences of the brain and surrounding structures were obtained without and with intravenous contrast.  CONTRAST:  15mL MULTIHANCE GADOBENATE DIMEGLUMINE 529 MG/ML IV SOLN  COMPARISON:  Head CT 06/13/2013 and brain MRI 05/16/2013  FINDINGS: Images are mildly to moderately degraded by motion artifact.  Incidental note is again made of a partially empty sella. No definite residual abnormal T2 or diffusion weighted signal is identified in the thalami. Mild periventricular T2 hyperintensity is unchanged and nonspecific. No new areas of brain parenchymal signal abnormality are  identified. There is mild cerebral atrophy. There is no evidence of acute infarct. There is no evidence of mass, midline shift, or extra-axial fluid collection. Orbits are unremarkable. Major intracranial vascular flow voids are unremarkable. Visualized paranasal sinuses and mastoid air cells are clear. There is no abnormal enhancement.  IMPRESSION: Interval resolution of thalamic signal abnormality. No evidence of new intracranial abnormality.   Electronically Signed   By: Sebastian Ache   On: 06/20/2013 17:26   Mr Cervical Spine Wo Contrast  06/22/2013   CLINICAL DATA:  Right hemiparesis. Possible autoimmune encephalitis.  EXAM: MRI CERVICAL SPINE WITHOUT CONTRAST  TECHNIQUE: Multiplanar, multisequence MR imaging was performed. No intravenous contrast was administered.  COMPARISON:  No comparison cervical spine MR. brain MR 06/20/2013.  FINDINGS: On this motion grade examination, artifact extends through the cervical cord however, no definitive cervical cord signal abnormality is noted. Cervical medullary junction unremarkable. Intracranial structures as detailed on recent MR of the brain.  Visualized paravertebral structures unremarkable. Both vertebral arteries are patent.  C2-3:  Negative.  C3-4:  Minimal right foraminal narrowing.  C4-5:  Minimal right foraminal narrowing.  C5-6:  Negative.  C6-7:  Negative.  C7-T1:  Negative.  T1-2:  Negative.  T2-3: Minimal Schmorl's node deformity.  Minimal bulge.  T3-4: Minimal bulge.  T4-5: Minimal bulge.  IMPRESSION: No evidence of cervical disc herniation.  Minimal degenerative changes upper thoracic spine.  Please see above.   Electronically Signed   By: Bridgett Larsson M.D.   On: 06/22/2013 19:38   US Transvaginal Non-ob  06/21/2013   CLINICAL DATA:  Altered mental status. Evaluation for possible paraneoplastic syndrome.  EXAM: TRANSABDOMINAL AND TRANSVAGINAL ULTRASOUND OF PELVIS  TECHNIQUE: Both transabdominal and transvaginal ultrasound examinations of the pelvis  were performed. Transabdominal technique was performed for global imaging of the pelvis including uterus, ovaries, adnexal regions, and pelvic cul-de-sac. It was necessary to proceed with endovaginal exam following the transabdominal exam to visualize the ovaries.  COMPARISON:  None  FINDINGS: Uterus  Removed.  Right ovary  Not visualized.  Left ovary  Not visualized.  Other findings  No free fluid.  IMPRESSION: The ovaries could not be visualized on transvaginal or transabdominal pelvic ultrasound. However, review of the CT scan of the pelvis dated 05/17/2013 does demonstrate that both ovaries appear normal, measuring 23 mm on the right and 16 mm on the left.   Electronically Signed   By: Geanie Cooley M.D.   On: 06/21/2013 16:45   US Pelvis Complete  06/21/2013   CLINICAL DATA:  Altered mental status. Evaluation for possible paraneoplastic syndrome.  EXAM: TRANSABDOMINAL AND TRANSVAGINAL ULTRASOUND OF PELVIS  TECHNIQUE: Both transabdominal and transvaginal ultrasound examinations of the pelvis were performed. Transabdominal technique was performed for global imaging of the pelvis including uterus, ovaries, adnexal regions, and pelvic cul-de-sac. It was necessary to proceed with endovaginal exam following the transabdominal exam to visualize the ovaries.  COMPARISON:  None  FINDINGS: Uterus  Removed.  Right ovary  Not visualized.  Left ovary  Not visualized.  Other findings  No free fluid.  IMPRESSION: The ovaries could not be visualized on transvaginal or transabdominal pelvic ultrasound. However, review of the CT scan of the pelvis dated 05/17/2013 does demonstrate that both ovaries appear normal, measuring 23 mm on the right and 16 mm on the left.   Electronically Signed   By: Geanie Cooley M.D.   On: 06/21/2013 16:45   Ir Fluoro Guide Cv Line Right  06/20/2013   CLINICAL DATA:  Encephalitis and need for tunneled catheter for plasmapheresis.  EXAM: TUNNELED CENTRAL VENOUS CATHETER PLACEMENT WITH ULTRASOUND  AND FLUOROSCOPIC GUIDANCE  MEDICATIONS: 1 g IV vancomycin. Vancomycin was given within two hours of incision. Vancomycin was given due to an antibiotic allergy.  ANESTHESIA/SEDATION: 2.0 mg IV  Versed; 100 mcg IV Fentanyl.  Total Moderate Sedation Time  Twenty minutes.  FLUOROSCOPY TIME:  42 seconds.  PROCEDURE: The procedure, risks, benefits, and alternatives were explained to the patient. Questions regarding the procedure were encouraged and answered. The patient understands and consents to the procedure.  The right neck and chest were prepped with chlorhexidine in a sterile fashion, and a sterile drape was applied covering the operative field. Maximum barrier sterile technique with sterile gowns and gloves were used for the procedure. Local anesthesia was provided with 1% lidocaine.  Ultrasound was used to confirm patency of the right internal jugular vein. After creating a small venotomy incision, a 21 gauge needle was advanced into the right internal jugular vein under direct, real-time ultrasound guidance. Ultrasound image documentation was performed. After securing guidewire access, an 8 Fr dilator was placed. A J-wire was kinked to measure appropriate catheter length.  A Bard Equistream tunneled hemodialysis/pheresis catheter measuring 19 cm from tip to cuff was chosen for placement. This was tunneled in a retrograde fashion from the chest wall to the venotomy incision.  At the venotomy, serial dilatation was performed and a 16 Fr peel-away sheath was placed over a guidewire. The catheter was then placed through the sheath and the sheath removed. Final catheter positioning was confirmed and documented with a fluoroscopic spot image. The catheter was aspirated, flushed with saline, and injected with appropriate volume heparin dwells.  The venotomy incision was closed with subcutaneous 3-0 Monocryl and subcuticular 4-0 Vicryl. Dermabond was applied to the incision. The catheter exit site was secured with  0-Prolene retention sutures.  COMPLICATIONS: None.  No pneumothorax.  FINDINGS: After catheter placement, the tips lie in the right atrium. The catheter aspirates normally and is ready for immediate use.  IMPRESSION: Placement of tunneled pheresis catheter via the right internal jugular vein. The catheter tips lie in the right atrium. The catheter is ready for immediate use.   Electronically Signed   By: Irish Lack M.D.   On: 06/20/2013 14:27   Ir Removal Tun Cv Cath W/o Fl  07/11/2013   CLINICAL DATA:  Sepsis, concern for right internal jugular tunneled central catheter infection, finished plasmapheresis treatment, request for removal.  EXAM: REMOVAL OF TUNNELED CENTRAL VENOUS CATHETER  PROCEDURE: Risks and benefits of procedure discussed with the patient's son over the phone, verbal consent was obtained.  The right chest tunneled central catheter site was prepped with chlorhexidine. A sterile gown and gloves were worn during the procedure.  Utilizing manual manipulation, the subcutaneous cuff of the central catheter was freed. The catheter was then successfully removed in its entirety. A sterile dressing was applied over the catheter exit site.  IMPRESSION: Removal of tunneled right internal jugular central catheter utilizing manual manipulation. The procedure was uncomplicated.  Read By:  Pattricia Boss PA-C   Electronically Signed   By: Malachy Moan M.D.   On: 07/11/2013 11:39   Ir Replc Duoden/jejuno Tube Percut W/fluoro  07/13/2013   CLINICAL DATA:  Occluded surgical jejunostomy requiring exchange.  EXAM: JEJUNOSTOMY TUBE EXCHANGE  CONTRAST:  10 ml Omnipaque-300  FLUOROSCOPY TIME:  1 min and 24 seconds.  PROCEDURE: Informed consent was obtained by telephone from the patient's son.  The pre-existing jejunostomy and surrounding skin were prepped with Betadine. Attempt was made to inject contrast via the pre-existing tube. The tube was cut and removed over a hydrophilic guidewire. A new 12 French  red rubber jejunostomy catheter was advanced over the wire.  Final catheter position  was confirmed with a fluoroscopic spot image obtained after injection of contrast. The catheter was secured at the exit site with a silk retention suture.  COMPLICATIONS: None.  FINDINGS: The pre-existing 63 French tube is occluded. A new 12 French tube was placed and advanced into the jejunum.  IMPRESSION: Replacement of occluded 10 French jejunostomy catheter. The tube was up sized to a 12 [place] catheter advanced into the jejunum over a guidewire.   Electronically Signed   By: Irish Lack M.D.   On: 07/13/2013 15:54   Ir US Guide Vasc Access Right  06/20/2013   CLINICAL DATA:  Encephalitis and need for tunneled catheter for plasmapheresis.  EXAM: TUNNELED CENTRAL VENOUS CATHETER PLACEMENT WITH ULTRASOUND AND FLUOROSCOPIC GUIDANCE  MEDICATIONS: 1 g IV vancomycin. Vancomycin was given within two hours of incision. Vancomycin was given due to an antibiotic allergy.  ANESTHESIA/SEDATION: 2.0 mg IV Versed; 100 mcg IV Fentanyl.  Total Moderate Sedation Time  Twenty minutes.  FLUOROSCOPY TIME:  42 seconds.  PROCEDURE: The procedure, risks, benefits, and alternatives were explained to the patient. Questions regarding the procedure were encouraged and answered. The patient understands and consents to the procedure.  The right neck and chest were prepped with chlorhexidine in a sterile fashion, and a sterile drape was applied covering the operative field. Maximum barrier sterile technique with sterile gowns and gloves were used for the procedure. Local anesthesia was provided with 1% lidocaine.  Ultrasound was used to confirm patency of the right internal jugular vein. After creating a small venotomy incision, a 21 gauge needle was advanced into the right internal jugular vein under direct, real-time ultrasound guidance. Ultrasound image documentation was performed. After securing guidewire access, an 8 Fr dilator was placed. A  J-wire was kinked to measure appropriate catheter length.  A Bard Equistream tunneled hemodialysis/pheresis catheter measuring 19 cm from tip to cuff was chosen for placement. This was tunneled in a retrograde fashion from the chest wall to the venotomy incision.  At the venotomy, serial dilatation was performed and a 16 Fr peel-away sheath was placed over a guidewire. The catheter was then placed through the sheath and the sheath removed. Final catheter positioning was confirmed and documented with a fluoroscopic spot image. The catheter was aspirated, flushed with saline, and injected with appropriate volume heparin dwells.  The venotomy incision was closed with subcutaneous 3-0 Monocryl and subcuticular 4-0 Vicryl. Dermabond was applied to the incision. The catheter exit site was secured with 0-Prolene retention sutures.  COMPLICATIONS: None.  No pneumothorax.  FINDINGS: After catheter placement, the tips lie in the right atrium. The catheter aspirates normally and is ready for immediate use.  IMPRESSION: Placement of tunneled pheresis catheter via the right internal jugular vein. The catheter tips lie in the right atrium. The catheter is ready for immediate use.   Electronically Signed   By: Irish Lack M.D.   On: 06/20/2013 14:27   Dg Chest Portable 1 View  07/17/2013   CLINICAL DATA:  Abdominal pain, tachycardia.  EXAM: PORTABLE CHEST - 1 VIEW  COMPARISON:  CT ANGIO CHEST W/CM &/OR WO/CM dated 07/11/2013; DG CHEST 1V PORT dated 07/11/2013  FINDINGS: Moderate right pleural effusion, likely slightly increased since prior study. Interval removal of right hemodialysis catheter. Right PICC line is unchanged. Patchy right lung airspace disease and left basilar airspace disease medially, both slightly increased since prior study. No acute bony abnormality.  IMPRESSION: Increasing patchy bilateral airspace disease as above. Increasing moderate right effusion.   Electronically  Signed   By: Charlett Nose M.D.   On:  07/17/2013 03:54   Dg Chest Port 1 View  07/11/2013   CLINICAL DATA:  Shortness of breath, evaluate pulmonary infiltrates  EXAM: PORTABLE CHEST - 1 VIEW  COMPARISON:  DG CHEST 1V PORT dated 07/10/2013; DG CHEST 1V PORT dated 07/03/2013; CT CHEST W/CM dated 06/21/2013; IR FLUORO GUIDE CV LINE*R* dated 06/20/2013; DG CHEST 1V PORT dated 05/11/2013  FINDINGS: Grossly unchanged cardiac silhouette and mediastinal contours given patient rotation. Stable position of support apparatus. Grossly unchanged right mid lung heterogeneous airspace opacities with worsening bilateral medial basilar airspace opacities. No definite pleural effusion or pneumothorax. No evidence of edema. A minimal amount of enteric contrast is seen within in the gastric fundus. Post cholecystectomy. Grossly unchanged bones.  IMPRESSION: 1.  Stable positioning of support apparatus.  No pneumothorax. 2. Worsening bibasilar airspace disease, possibly atelectasis though progression of multifocal infection / aspiration is not excluded. A follow-up chest radiograph in 4 to 6 weeks after treatment is recommended to ensure resolution.   Electronically Signed   By: Simonne Come M.D.   On: 07/11/2013 07:47   Dg Chest Port 1 View  07/10/2013   CLINICAL DATA:  Shortness of breath.  Hypotension  EXAM: PORTABLE CHEST - 1 VIEW  COMPARISON:  DG CHEST 1V PORT dated 07/03/2013; CT CHEST W/CM dated 06/21/2013  FINDINGS: Right internal jugular dialysis catheter tip: Right atrium. Thoracic spondylosis noted. There is a band of airspace opacity in the right upper lobe just above the minor fissure. Improved aeration at the right lung base.  IMPRESSION: Improved aeration at the right lung base, with but there is a band of airspace opacity in the right upper lobe favoring atelectasis over pneumonia. Next item thoracic spondylosis.   Electronically Signed   By: Herbie Baltimore M.D.   On: 07/10/2013 08:27   Dg Chest Port 1 View  07/03/2013   CLINICAL DATA:  Tubes and lines in  good position, detailed above.  EXAM: PORTABLE CHEST - 1 VIEW  COMPARISON:  Chest CT 06/21/2013  FINDINGS: There is nasogastric tube which terminates near the pylorus. Right IJ catheter, tip in the upper right atrium. Right upper extremity PICC, tip at the upper cavoatrial junction.  Oral contrast noted within the gastric fundus and small bowel.  Haziness of the right base, with volume loss. Node definitive consolidation.  No cardiomegaly.  IMPRESSION: 1. Negative for pneumonia. 2. Haziness of the right lower chest, likely pleural fluid or atelectasis. 3. Tubes and lines in good position, detailed above.   Electronically Signed   By: Tiburcio Pea M.D.   On: 07/03/2013 03:35   Dg Abd Portable 1v  07/17/2013   CLINICAL DATA:  J-tube placement.  EXAM: PORTABLE ABDOMEN - 1 VIEW  COMPARISON:  07/13/2013  FINDINGS: Contrast was injected through the jejunostomy tube. This opacifies small bowel loops in the upper pelvis. No evidence of contrast extravasation.  Slightly prominent bowel loops or superiorly in the abdomen, nonspecific. Prior cholecystectomy. No free air.  IMPRESSION: Jejunostomy tube within upper pelvic small bowel loops. Mildly prominent abdominal small bowel loops which could reflect mild focal ileus although early or partial small bowel obstruction cannot be excluded.   Electronically Signed   By: Charlett Nose M.D.   On: 07/17/2013 03:57   Dg Abd Portable 1v  07/12/2013   CLINICAL DATA:  Abdominal pain  EXAM: PORTABLE ABDOMEN - 1 VIEW  COMPARISON:  07/11/2013  FINDINGS: Postsurgical changes are again seen.  A feeding catheter is noted. Scattered large and small bowel gas is seen. No obstructive changes are noted. No definitive free air is seen.  IMPRESSION: No significant change from the prior exam.   Electronically Signed   By: Alcide CleverMark  Lukens M.D.   On: 07/12/2013 07:44   Dg Abd Portable 1v  07/11/2013   CLINICAL DATA:  Abdominal distention, evaluate for small bowel obstruction  EXAM: PORTABLE  ABDOMEN - 1 VIEW  COMPARISON:  CT ABD/PELV WO CM dated 07/10/2013; DG ABD PORTABLE 1V dated 07/10/2013; DG ABD PORTABLE 1V dated 06/30/2013  FINDINGS: There has been passage of the majority of previously administered enteric contrast. A minimal amount of enteric contrast remains within the colon. Paucity of bowel gas without definite evidence of obstruction. Nondiagnostic evaluation pneumoperitoneum secondary supine positioning exclusion of lower thorax. No definite pneumatosis or portal venous gas.  A feeding jejunostomy tube overlies the left mid/ lower abdomen.  A vertically aligned skin staples overlie the right mid abdomen. Post cholecystectomy.  Unchanged bones.  IMPRESSION: Paucity of bowel gas, however there has been apparent passage of previously ingested enteric contrast. No definite evidence of obstruction.   Electronically Signed   By: Simonne ComeJohn  Watts M.D.   On: 07/11/2013 08:26   Dg Abd Portable 1v  07/10/2013   CLINICAL DATA:  Assess for ileus  EXAM: PORTABLE ABDOMEN - 1 VIEW  COMPARISON:  June 30, 2013  FINDINGS: There is persistent air-filled dilated small bowel loops in the left abdomen. Air is noted in the colon. Skin staples are projected over the right abdomen. Patient is status post prior cholecystectomy.  IMPRESSION: Persistent ileus.   Electronically Signed   By: Sherian ReinWei-Chen  Lin M.D.   On: 07/10/2013 02:30   Dg Abd Portable 1v  06/30/2013   CLINICAL DATA:  NG tube placement.  EXAM: PORTABLE ABDOMEN - 1 VIEW  COMPARISON:  Single view of the abdomen 06/27/2013. Upper GI/small bowel follow-through 06/28/2013  FINDINGS: NG tube is in place in good position with the tip in this distal stomach. Contrast material in small bowel from the comparison upper GI series/small bowel follow-through. Small bowel loops are dilated. Only a small amount of loculated barium is seen in the colon.  IMPRESSION: NG tube in good position.  Small bowel obstruction.   Electronically Signed   By: Drusilla Kannerhomas  Dalessio M.D.   On:  06/30/2013 19:01   Dg Ugi W/small Bowel  06/28/2013   CLINICAL DATA:  53 year old female postop 2 days since percutaneous jejunostomy tube placement. Drainage from NG tube. Abdominal pain. Initial encounter.  EXAM: UPPER GI SERIES WITH SMALL BOWEL FOLLOW-THROUGH  FLUOROSCOPIC GUIDED NG TUBE PLACEMENT  TECHNIQUE: Combined double contrast and single contrast upper GI series using effervescent crystals, thick barium, and thin barium. Subsequently, serial images of the small bowel were obtained including spot views of the terminal ileum.  COMPARISON:  KUB 06/27/2013 and earlier.  FLUOROSCOPY TIME:  9 min and 18 seconds.  FINDINGS: Preprocedural scout view of the abdomen demonstrates a small volume of loculated barium in the colon. This is related to the 06/17/2013 NG tube placement/ confirmation study.  Scout view demonstrates the recently placed right an mid lower abdomen percutaneous jejunostomy. Right lower abdomen skin staples in place.  The scout view also demonstrates the patient's NG tube is looped back up into the esophagus. This was evaluated in real-time with fluoroscopy, and a wire was placed through the tube in an effort to maneuver the tip back into the stomach. However, this  was unsuccessful an the tube had be retracted into the nasopharynx. Using fluoroscopic guidance, the tube was then advanced with a wire into the stomach, such that the tip and side hole are within the stomach.  Given the recent abdominal surgery, initially water-soluble contrast was planned, however prominent gastroesophageal reflux of contrast was noted early on, and the decision was made to switch contrast to thin barium. Water-soluble contrast was fully aspirated from the stomach. Subsequently, barium contrast was slowly administered.  The stomach appears small. No gastrojejunostomy is evident. After a short delay, barium emptied the stomach into the proximal duodenum. The second portion of the duodenum is dilated.  After 20  additional min significantly dilated proximal jejunum is opacified, with the small bowel caliber up to 58 mm.  At this point study was discussed with Dr. Jimmye Norman . We decided to inject the percutaneous jejunostomy tube, and give the barium more time to opacified a small bowel in hopes of loose to dating the small bowel obstruction transition point.  Barium injection through the jejunostomy tube demonstrates normal small bowel loops at the jejunostomy site. This barium was then aspirated.  Subsequently a 3 hr delayed image was obtained demonstrating a gradual tapering of small bowel loops into the right lower abdomen, heading toward the vicinity of the jejunostomy. These smaller loops measure 23 mm diameter (arrow).  At this point, Dr. Lindie Spruce advised that no additional imaging was needed.  IMPRESSION: 1. High-grade small bowel obstruction. Dilated duodenum and proximal jejunum up to 58 mm diameter. 2. Small bowel loops slowly taper to the right lower abdomen in the vicinity of the percutaneous jejunostomy. 3. The jejunostomy was injected with contrast, and is situated within normal decompressed small bowel. 4. Diminutive stomach. No gastrojejunostomy is evident. Prominent gastroesophageal reflux. 5. At the beginning of the exam the patient's NG tube was malpositioned. This was withdrawn to the nasopharynx and repositioned into the stomach under fluoroscopic guidance.   Electronically Signed   By: Augusto Gamble M.D.   On: 06/28/2013 16:29     Microbiology: Recent Results (from the past 240 hour(s))  URINE CULTURE     Status: None   Collection Time    07/10/13  8:15 AM      Result Value Ref Range Status   Specimen Description URINE, CATHETERIZED   Final   Special Requests NONE   Final   Culture  Setup Time     Final   Value: 07/10/2013 09:13     Performed at [facility] Count     Final   Value: 50,000 COLONIES/ML     Performed at [facility]   Culture     Final   Value:  KLEBSIELLA PNEUMONIAE     YEAST     Performed at [facility]   Report Status 07/13/2013 FINAL   Final   Organism ID, Bacteria KLEBSIELLA PNEUMONIAE   Final  CLOSTRIDIUM DIFFICILE BY PCR     Status: None   Collection Time    07/10/13  8:33 AM      Result Value Ref Range Status   C difficile by pcr NEGATIVE  NEGATIVE Final  CULTURE, BLOOD (ROUTINE X 2)     Status: None   Collection Time    07/10/13 10:55 AM      Result Value Ref Range Status   Specimen Description BLOOD LEFT ARM   Final   Special Requests BOTTLES DRAWN AEROBIC ONLY 8CC   Final  Culture  Setup Time     Final   Value: 07/10/2013 16:23     Performed at [facility]   Culture     Final   Value: KLEBSIELLA PNEUMONIAE     Note: Gram Stain Report Called to,Read Back By and Verified With: HOLLY CHURCH 07/11/13 AT 0330 RIDK     Performed at [facility]   Report Status 07/13/2013 FINAL   Final   Organism ID, Bacteria KLEBSIELLA PNEUMONIAE   Final  MRSA PCR SCREENING     Status: None   Collection Time    07/10/13 12:55 PM      Result Value Ref Range Status   MRSA by PCR NEGATIVE  NEGATIVE Final   Comment:            The GeneXpert MRSA Assay (FDA     approved for NASAL specimens     only), is one component of a     comprehensive MRSA colonization     surveillance program. It is not     intended to diagnose MRSA     infection nor to guide or     monitor treatment for     MRSA infections.  CULTURE, BLOOD (ROUTINE X 2)     Status: None   Collection Time    07/12/13 10:40 AM      Result Value Ref Range Status   Specimen Description BLOOD LEFT FOREARM   Final   Special Requests BOTTLES DRAWN AEROBIC AND ANAEROBIC 10CC   Final   Culture  Setup Time     Final   Value: 07/12/2013 14:02     Performed at [facility]   Culture     Final   Value: NO GROWTH 5 DAYS     Performed at [facility]   Report Status 07/18/2013 FINAL   Final  CULTURE, BLOOD (ROUTINE X 2)     Status: None    Collection Time    07/12/13 10:50 AM      Result Value Ref Range Status   Specimen Description BLOOD LEFT ARM   Final   Special Requests BOTTLES DRAWN AEROBIC AND ANAEROBIC 10CC   Final   Culture  Setup Time     Final   Value: 07/12/2013 14:02     Performed at [facility]   Culture     Final   Value: NO GROWTH 5 DAYS     Performed at [facility]   Report Status 07/18/2013 FINAL   Final  CULTURE, BLOOD (ROUTINE X 2)     Status: None   Collection Time    07/17/13  3:35 AM      Result Value Ref Range Status   Specimen Description BLOOD RIGHT PICC LINE   Final   Special Requests BOTTLES DRAWN AEROBIC AND ANAEROBIC 10CC   Final   Culture  Setup Time     Final   Value: 07/17/2013 08:11     Performed at [facility]   Culture     Final   Value:        BLOOD CULTURE RECEIVED NO GROWTH TO DATE CULTURE WILL BE HELD FOR 5 DAYS BEFORE ISSUING A FINAL NEGATIVE REPORT     Performed at [facility]   Report Status PENDING   Incomplete  CULTURE, BLOOD (ROUTINE X 2)     Status: None   Collection Time    07/17/13  3:52 AM      Result  Value Ref Range Status   Specimen Description BLOOD LEFT ARM   Final   Special Requests BOTTLES DRAWN AEROBIC ONLY 10CC   Final   Culture  Setup Time     Final   Value: 07/17/2013 08:11     Performed at [facility]   Culture     Final   Value:        BLOOD CULTURE RECEIVED NO GROWTH TO DATE CULTURE WILL BE HELD FOR 5 DAYS BEFORE ISSUING A FINAL NEGATIVE REPORT     Performed at [facility]   Report Status PENDING   Incomplete  URINE CULTURE     Status: None   Collection Time    07/17/13  4:27 AM      Result Value Ref Range Status   Specimen Description URINE, CATHETERIZED   Final   Special Requests Normal   Final   Culture  Setup Time     Final   Value: 07/16/2013 03:22     Performed at [facility] Count     Final   Value: NO GROWTH     Performed at [facility]   Culture      Final   Value: NO GROWTH     Performed at [facility]   Report Status 07/18/2013 FINAL   Final  CLOSTRIDIUM DIFFICILE BY PCR     Status: None   Collection Time    07/17/13 10:30 AM      Result Value Ref Range Status   C difficile by pcr NEGATIVE  NEGATIVE Final     Labs: Basic Metabolic Panel:  Recent Labs Lab 07/14/13 0415 07/15/13 0500  07/16/13 0550 07/17/13 0335 07/18/13 0615 07/19/13 0455  NA 146 143  --   --  144 142 143  K 3.1* 2.5*  < > 3.8 3.7 3.7 4.4  CL 112 109  --   --  108 106 107  CO2 26 24  --   --  24 26 25   GLUCOSE 81 101*  --   --  85 86 93  BUN 3* <3*  --   --  4* 3* 3*  CREATININE 0.59 0.55  --   --  0.55 0.54 0.53  CALCIUM 7.9* 7.7*  --   --  8.0* 7.9* 8.0*  MG 2.0  --   --  1.9  --   --   --   < > = values in this interval not displayed. Liver Function Tests:  Recent Labs Lab 07/13/13 0500 07/14/13 0415 07/15/13 0500 07/17/13 0335  AST 15 16 15 21   ALT 9 9 9 10   ALKPHOS 53 49 55 68  BILITOT 0.3 0.3 0.3 0.5  PROT 4.2* 4.0* 4.3* 4.9*  ALBUMIN 1.8* 1.8* 1.9* 2.3*   No results found for this basename: LIPASE, AMYLASE,  in the last 168 hours  Recent Labs Lab 07/17/13 1320  AMMONIA 17   CBC:  Recent Labs Lab 07/13/13 0500 07/14/13 0415 07/17/13 0335 07/18/13 0615 07/19/13 0455  WBC 5.0 4.7 8.9 7.7 6.1  NEUTROABS  --   --  6.3  --   --   HGB 10.7* 10.3* 11.5* 10.8* 10.2*  HCT 31.5* 30.2* 34.1* 31.9* 31.2*  MCV 90.8 91.2 94.7 94.9 96.6  PLT 153 144* 130* 143* 137*   Cardiac Enzymes: No results found for this basename: CKTOTAL, CKMB, CKMBINDEX, TROPONINI,  in the last 168 hours BNP: No components found with this basename:  POCBNP,  CBG:  Recent Labs Lab 07/18/13 0353 07/18/13 1613 07/18/13 2142 07/19/13 0009 07/19/13 0357  GLUCAP 109* 79 75 91 90    Time coordinating discharge:  Greater than 30 minutes  Signed:  Etosha Wetherell, DO [facility] Pager: [phone] 07/19/2013, 10:31 AM

## 2013-07-18 NOTE — Progress Notes (Signed)
RN in room during hourly rounding. Pt. Awake and alert stating she felt nauseated. RN at pt. Beside when pt. Began vomiting up bile. Pts. Continuous Tube feeding stopped. On call NP, Coralie CarpenL. Easterwood, made aware. Orders to keep pt. NPO obtained. RN to administer PRN antiemtic to pt. For nausea and continue to monitor for changes in condition. Weiland Tomich, Cheryll DessertKaren Cherrell

## 2013-07-18 NOTE — Progress Notes (Signed)
Clinical Social Work Department BRIEF PSYCHOSOCIAL ASSESSMENT 07/18/2013  Patient:  Joan [name],Joan [name]     Account Number:  [id number]     Admit date:  07/17/2013  Clinical Social Worker:  Illene SilverRAKE,Matej Sappenfield, LCSW  Date/Time:  07/18/2013 10:19 PM  Referred by:  CSW  Date Referred:  07/18/2013 Referred for  SNF Placement   [facility]   Interview type:  Family Other interview type:    PSYCHOSOCIAL DATA Living Status:  FACILITY Admitted from facility:  [name] Level of care:  Skilled Nursing Facility Primary support name:  Joan [name] Primary support relationship to patient:  CHILD, ADULT Degree of support available:   Undetermined.    CURRENT CONCERNS Current Concerns  Post-Acute Placement   Other Concerns:    SOCIAL WORK ASSESSMENT / PLAN CSW spoke with pt's son Joan [name] via phone.  Joan [name] informed of pt's ? d/c back to [name] in the am.  [name] was recieving there.  [facility] updated and faxed out for bed offers.  Unit CSW informed of plan and will f/u in the am.   Assessment/plan status:  Psychosocial Support/Ongoing Assessment of Needs Other assessment/ plan:   Information/referral to community resources:    PATIENT'S/FAMILY'S RESPONSE TO PLAN OF CARE: Pt frustrated that he had not heard from anyone re: pt's d/c until now.  Appreciated CSW call and understanding of his concerns about [facility] not caring for his mother appropriately.  Emotional support offered.

## 2013-07-18 NOTE — Progress Notes (Signed)
[facility] PROGRESS NOTE  Joan [name] ZOX:[id number] DOB: 1961/04/22 DOA: 07/17/2013 PCP: [name], MD  Assessment/Plan: Sinus tachycardia/SIRS  -Tachycardia improving with the restart of atenolol -According to the medical record this has been a chronic problem for the last several weeks which showed some mild improvement after beta blocker was started on the last admission  -This is likely due to her acute medical illness  -Right upper extremity duplex negative for DVT -appreciate Dr. 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 Pt received diatech catheter on 06/20/2013 placement and received and finished plasmapheresis  -Mentation much improved today -speech to eval swallowing--dysphagia 1 diet with thin liquids -Check serum B12--667 - RBC folate--pending - RPR--NR -ammonia--17  -Updated information revealed that the patient did not receive any medications during her short stay [name]--question seizure with postictal state as reason for patient's acute encephalopathy Hx of Convulsions/Seizures  -Pt has a PMH of seizures and catatonia.  - 06/20/13 MRI of the brain showed resolution of thalamic signal abnormality  -continue current AEDs  Hx of Anxiety  -Continue pt on Remeron  -Patient appears to have had a psychiatric history  -Appears to be stable at this time -Psych prescribed Risperdal .5 mg bid previous admission  HTN  -restart atenolol at half home dose and titrate as needed  Malnutrition with history of Gastric Bypass  -  encourage PO intake with TF -J-tube resutured and stable -Gen Surg consulted, J-tube placed and underwent revision of j tube with exploratory laparotomy on 2/3 and small bowel resection.  Thrombocytopenia  -No acute bleeding monitor  -chronic  Pleural effusions  -Currently no respiratory distress  -Oxygen saturation 100% on room air  -May need to reassess need for thoracocentesis if becomes hypoxemic  -Remains stable when compared to prior CT angiogram of the chest on 07/11/2013  Loose Stools  -Patient had large volume yellow loose stool in the ED  -C. difficile PCR negative    Family Communication:   Pt at beside Disposition Plan:   SNF 07/19/13 if stable       Procedures/Studies: Ct Abdomen Pelvis Wo Contrast  07/10/2013   CLINICAL DATA:  Sepsis question intra-abdominal source  EXAM: CT ABDOMEN AND PELVIS WITHOUT CONTRAST  TECHNIQUE: Multidetector CT imaging of the abdomen and pelvis was performed following the standard protocol without intravenous contrast. Sagittal and coronal MPR images reconstructed from axial data set. GI contrast administered  COMPARISON:  CT pelvis 05/17/2013; CT abdomen and pelvis 06/02/2011  FINDINGS: Small moderate-sized right pleural effusion with basilar atelectasis.  Minimal atelectasis left base.  Gallbladder surgically absent.  Prior gastric surgery.  Jejunostomy tube left mid abdomen, through which GI contrast was administered.  Dilated small bowel loops in the upper abdomen terminating at an anastomotic staple line in the left mid abdomen most compatible with small bowel obstruction ; the lack of dilatation of the remaining small bowel and colon makes ileus much less likely.  Small bowel measures up to 4.8 cm transverse mild bowel wall thickening of the dilated loops.  Distal small bowel is decompressed and unremarkable.  Small foci of gas in the left mid abdomen into the  upper left pelvis appear to be within several small bowel loops which are  nondistended and no definite free intraperitoneal air is identified.  Decompressed colon containing contrast and a rectal tube.  Unremarkable bladder and ovaries with surgical absence of uterus.  Small amount of free pelvic fluid.  Diffuse soft tissue edema/ anasarca.  No mass, adenopathy, or hernia.  Osseous structures unremarkable.  IMPRESSION: Dilated proximal small bowel loops proximal to the jejunostomy site most consistent with small bowel obstruction, associated with mild bowel wall thickening and duodenal dilatation.  Decompressed distal small bowel and colon.  Small amount of nonspecific free pelvic fluid.   Electronically Signed   By: Ulyses Southward M.D.   On: 07/10/2013 17:38   Dg Abd 1 View  06/27/2013   CLINICAL DATA:  Nausea, abdominal pain, vomiting  EXAM: ABDOMEN - 1 VIEW  COMPARISON:  None.  FINDINGS: There is a surgical drain present in the mid abdomen. There are surgical stated in the right paramedian abdominal wall. There is a small amount of contrast scattered throughout the colon. There is no bowel dilatation to suggest obstruction. There is no evidence of pneumoperitoneum, portal venous gas or pneumatosis. There are no pathologic calcifications along the expected course of the ureters.The osseous structures are unremarkable.  IMPRESSION: Nonobstructive bowel gas pattern.   Electronically Signed   By: Elige Ko   On: 06/27/2013 09:34   Ct Chest W Contrast  06/21/2013   CLINICAL DATA:  Altered mental status.  Evaluate for malignancy.  EXAM: CT CHEST WITH CONTRAST  TECHNIQUE: Multidetector CT imaging of the chest was performed during intravenous contrast administration.  CONTRAST:  75mL OMNIPAQUE IOHEXOL 300 MG/ML  SOLN  COMPARISON:  Chest x-ray dated 05/11/2013  FINDINGS: Heart size and pulmonary vascularity are normal. There is no hilar or mediastinal adenopathy. The lungs are clear. No mass lesions, infiltrates, or effusions. Soft tissues of the chest are normal. Thyroid gland is normal.  No acute osseous abnormality. Osteophytes fuse the thoracic spine from T6 through T10.  IMPRESSION: No evidence of malignancy or other significant abnormality of the chest.   Electronically Signed   By: Geanie Cooley M.D.   On: 06/21/2013 17:13   Ct Angio Chest Pe W/cm &/or Wo Cm  07/17/2013   CLINICAL DATA:  Tachycardia.  Upper chest pain, shortness of breath.  EXAM: CT ANGIOGRAPHY CHEST WITH CONTRAST  TECHNIQUE: Multidetector CT imaging of the chest was performed using the standard protocol during bolus administration of intravenous contrast. Multiplanar CT image reconstructions and MIPs were obtained to evaluate the vascular anatomy.  CONTRAST:  70mL OMNIPAQUE IOHEXOL 350 MG/ML SOLN  COMPARISON:  DG CHEST 1V PORT dated 07/17/2013; CT ANGIO CHEST W/CM &/OR WO/CM dated 07/11/2013  FINDINGS: No filling defects in the pulmonary arteries to suggest pulmonary emboli. Heart is normal size. Aorta is normal caliber. No dissection.  Moderate left pleural effusion and large right pleural effusion. These are stable since prior study. Compressive atelectasis in the lower lobes bilaterally. No mediastinal, hilar or axillary adenopathy. Chest wall soft tissues are unremarkable. Imaging into the upper abdomen shows no acute findings.  Review of the MIP images confirms the above findings.  IMPRESSION: No evidence of pulmonary embolus.  Continued bilateral effusions, right greater than left, unchanged. Compressive atelectasis in the lower lobes.  No change since prior study.   Electronically Signed   By: Charlett Nose M.D.   On: 07/17/2013 06:48   Ct Angio Chest Pe W/cm &/or Wo Cm  07/11/2013  CLINICAL DATA:  Altered mental status, tachycardia, chest pain and shortness breath  EXAM: CT ANGIOGRAPHY CHEST WITH CONTRAST  TECHNIQUE: Multidetector CT imaging of the chest was performed using the standard protocol during bolus administration of intravenous contrast. Multiplanar CT image reconstructions and MIPs were obtained to  evaluate the vascular anatomy.  CONTRAST:  OMNIPAQUE IOHEXOL 350 MG/ML SOLN  COMPARISON:  DG CHEST 1V PORT dated 07/11/2013; CT ABD/PELV WO CM dated 07/10/2013; CT CHEST W/CM dated 06/21/2013  FINDINGS: There are no filling defects within the pulmonary arteries to suggest acute pulmonary embolism. Acute findings of the aorta or great vessels. No pericardial fluid.  There are bilateral pleural effusions, moderate to large on the right and small on the left. There is associated right lower lobe passive atelectasis.  Limited view of the upper abdomen is unremarkable. Limited view of the skeleton demonstrates degenerative spurring.  Review of the MIP images confirms the above findings.  IMPRESSION: 1. No evidence acute pulmonary embolism. 2. Moderate to large right pleural effusion with associated passive atelectasis. 3. No pericardial fluid.   Electronically Signed   By: Genevive Bi M.D.   On: 07/11/2013 17:53   Mr Laqueta Jean ZO Contrast  06/20/2013   CLINICAL DATA:  Altered mental status. Possible autoimmune encephalitis.  EXAM: MRI HEAD WITHOUT AND WITH CONTRAST  TECHNIQUE: Multiplanar, multiecho pulse sequences of the brain and surrounding structures were obtained without and with intravenous contrast.  CONTRAST:  15mL MULTIHANCE GADOBENATE DIMEGLUMINE 529 MG/ML IV SOLN  COMPARISON:  Head CT 06/13/2013 and brain MRI 05/16/2013  FINDINGS: Images are mildly to moderately degraded by motion artifact.  Incidental note is again made of a partially empty sella. No definite residual abnormal T2 or diffusion weighted signal is identified in the thalami. Mild periventricular T2 hyperintensity is unchanged and nonspecific. No new areas of brain parenchymal signal abnormality are identified. There is mild cerebral atrophy. There is no evidence of acute infarct. There is no evidence of mass, midline shift, or extra-axial fluid collection. Orbits are unremarkable. Major intracranial vascular flow voids are unremarkable.  Visualized paranasal sinuses and mastoid air cells are clear. There is no abnormal enhancement.  IMPRESSION: Interval resolution of thalamic signal abnormality. No evidence of new intracranial abnormality.   Electronically Signed   By: Sebastian Ache   On: 06/20/2013 17:26   Mr Cervical Spine Wo Contrast  06/22/2013   CLINICAL DATA:  Right hemiparesis. Possible autoimmune encephalitis.  EXAM: MRI CERVICAL SPINE WITHOUT CONTRAST  TECHNIQUE: Multiplanar, multisequence MR imaging was performed. No intravenous contrast was administered.  COMPARISON:  No comparison cervical spine MR. brain MR 06/20/2013.  FINDINGS: On this motion grade examination, artifact extends through the cervical cord however, no definitive cervical cord signal abnormality is noted. Cervical medullary junction unremarkable. Intracranial structures as detailed on recent MR of the brain.  Visualized paravertebral structures unremarkable. Both vertebral arteries are patent.  C2-3:  Negative.  C3-4:  Minimal right foraminal narrowing.  C4-5:  Minimal right foraminal narrowing.  C5-6:  Negative.  C6-7:  Negative.  C7-T1:  Negative.  T1-2:  Negative.  T2-3: Minimal Schmorl's node deformity.  Minimal bulge.  T3-4: Minimal bulge.  T4-5: Minimal bulge.  IMPRESSION: No evidence of cervical disc herniation.  Minimal degenerative changes upper thoracic spine.  Please see above.   Electronically Signed   By: Bridgett Larsson M.D.   On: 06/22/2013 19:38   US Transvaginal Non-ob  06/21/2013   CLINICAL DATA:  Altered mental status. Evaluation for possible  paraneoplastic syndrome.  EXAM: TRANSABDOMINAL AND TRANSVAGINAL ULTRASOUND OF PELVIS  TECHNIQUE: Both transabdominal and transvaginal ultrasound examinations of the pelvis were performed. Transabdominal technique was performed for global imaging of the pelvis including uterus, ovaries, adnexal regions, and pelvic cul-de-sac. It was necessary to proceed with endovaginal exam following the transabdominal exam to  visualize the ovaries.  COMPARISON:  None  FINDINGS: Uterus  Removed.  Right ovary  Not visualized.  Left ovary  Not visualized.  Other findings  No free fluid.  IMPRESSION: The ovaries could not be visualized on transvaginal or transabdominal pelvic ultrasound. However, review of the CT scan of the pelvis dated 05/17/2013 does demonstrate that both ovaries appear normal, measuring 23 mm on the right and 16 mm on the left.   Electronically Signed   By: Geanie Cooley M.D.   On: 06/21/2013 16:45   US Pelvis Complete  06/21/2013   CLINICAL DATA:  Altered mental status. Evaluation for possible paraneoplastic syndrome.  EXAM: TRANSABDOMINAL AND TRANSVAGINAL ULTRASOUND OF PELVIS  TECHNIQUE: Both transabdominal and transvaginal ultrasound examinations of the pelvis were performed. Transabdominal technique was performed for global imaging of the pelvis including uterus, ovaries, adnexal regions, and pelvic cul-de-sac. It was necessary to proceed with endovaginal exam following the transabdominal exam to visualize the ovaries.  COMPARISON:  None  FINDINGS: Uterus  Removed.  Right ovary  Not visualized.  Left ovary  Not visualized.  Other findings  No free fluid.  IMPRESSION: The ovaries could not be visualized on transvaginal or transabdominal pelvic ultrasound. However, review of the CT scan of the pelvis dated 05/17/2013 does demonstrate that both ovaries appear normal, measuring 23 mm on the right and 16 mm on the left.   Electronically Signed   By: Geanie Cooley M.D.   On: 06/21/2013 16:45   Ir Fluoro Guide Cv Line Right  06/20/2013   CLINICAL DATA:  Encephalitis and need for tunneled catheter for plasmapheresis.  EXAM: TUNNELED CENTRAL VENOUS CATHETER PLACEMENT WITH ULTRASOUND AND FLUOROSCOPIC GUIDANCE  MEDICATIONS: 1 g IV vancomycin. Vancomycin was given within two hours of incision. Vancomycin was given due to an antibiotic allergy.  ANESTHESIA/SEDATION: 2.0 mg IV Versed; 100 mcg IV Fentanyl.  Total Moderate  Sedation Time  Twenty minutes.  FLUOROSCOPY TIME:  42 seconds.  PROCEDURE: The procedure, risks, benefits, and alternatives were explained to the patient. Questions regarding the procedure were encouraged and answered. The patient understands and consents to the procedure.  The right neck and chest were prepped with chlorhexidine in a sterile fashion, and a sterile drape was applied covering the operative field. Maximum barrier sterile technique with sterile gowns and gloves were used for the procedure. Local anesthesia was provided with 1% lidocaine.  Ultrasound was used to confirm patency of the right internal jugular vein. After creating a small venotomy incision, a 21 gauge needle was advanced into the right internal jugular vein under direct, real-time ultrasound guidance. Ultrasound image documentation was performed. After securing guidewire access, an 8 Fr dilator was placed. A J-wire was kinked to measure appropriate catheter length.  A Bard Equistream tunneled hemodialysis/pheresis catheter measuring 19 cm from tip to cuff was chosen for placement. This was tunneled in a retrograde fashion from the chest wall to the venotomy incision.  At the venotomy, serial dilatation was performed and a 16 Fr peel-away sheath was placed over a guidewire. The catheter was then placed through the sheath and the sheath removed. Final catheter positioning was confirmed and documented with a fluoroscopic spot  image. The catheter was aspirated, flushed with saline, and injected with appropriate volume heparin dwells.  The venotomy incision was closed with subcutaneous 3-0 Monocryl and subcuticular 4-0 Vicryl. Dermabond was applied to the incision. The catheter exit site was secured with 0-Prolene retention sutures.  COMPLICATIONS: None.  No pneumothorax.  FINDINGS: After catheter placement, the tips lie in the right atrium. The catheter aspirates normally and is ready for immediate use.  IMPRESSION: Placement of tunneled  pheresis catheter via the right internal jugular vein. The catheter tips lie in the right atrium. The catheter is ready for immediate use.   Electronically Signed   By: Irish LackGlenn  Yamagata M.D.   On: 06/20/2013 14:27   Ir Removal Tun Cv Cath W/o Fl  07/11/2013   CLINICAL DATA:  Sepsis, concern for right internal jugular tunneled central catheter infection, finished plasmapheresis treatment, request for removal.  EXAM: REMOVAL OF TUNNELED CENTRAL VENOUS CATHETER  PROCEDURE: Risks and benefits of procedure discussed with the patient's son over the phone, verbal consent was obtained.  The right chest tunneled central catheter site was prepped with chlorhexidine. A sterile gown and gloves were worn during the procedure.  Utilizing manual manipulation, the subcutaneous cuff of the central catheter was freed. The catheter was then successfully removed in its entirety. A sterile dressing was applied over the catheter exit site.  IMPRESSION: Removal of tunneled right internal jugular central catheter utilizing manual manipulation. The procedure was uncomplicated.  Read By:  Pattricia BossKoreen Morgan PA-C   Electronically Signed   By: Malachy MoanHeath  McCullough M.D.   On: 07/11/2013 11:39   Ir Replc Duoden/jejuno Tube Percut W/fluoro  07/13/2013   CLINICAL DATA:  Occluded surgical jejunostomy requiring exchange.  EXAM: JEJUNOSTOMY TUBE EXCHANGE  CONTRAST:  10 ml Omnipaque-300  FLUOROSCOPY TIME:  1 min and 24 seconds.  PROCEDURE: Informed consent was obtained by telephone from the patient's son.  The pre-existing jejunostomy and surrounding skin were prepped with Betadine. Attempt was made to inject contrast via the pre-existing tube. The tube was cut and removed over a hydrophilic guidewire. A new 12 French red rubber jejunostomy catheter was advanced over the wire.  Final catheter position was confirmed with a fluoroscopic spot image obtained after injection of contrast. The catheter was secured at the exit site with a silk retention suture.   COMPLICATIONS: None.  FINDINGS: The pre-existing 2110 French tube is occluded. A new 12 French tube was placed and advanced into the jejunum.  IMPRESSION: Replacement of occluded 10 French jejunostomy catheter. The tube was up sized to a 12 [place] catheter advanced into the jejunum over a guidewire.   Electronically Signed   By: Irish LackGlenn  Yamagata M.D.   On: 07/13/2013 15:54   Ir [place] Guide Vasc Access Right  06/20/2013   CLINICAL DATA:  Encephalitis and need for tunneled catheter for plasmapheresis.  EXAM: TUNNELED CENTRAL VENOUS CATHETER PLACEMENT WITH ULTRASOUND AND FLUOROSCOPIC GUIDANCE  MEDICATIONS: 1 g IV vancomycin. Vancomycin was given within two hours of incision. Vancomycin was given due to an antibiotic allergy.  ANESTHESIA/SEDATION: 2.0 mg IV Versed; 100 mcg IV Fentanyl.  Total Moderate Sedation Time  Twenty minutes.  FLUOROSCOPY TIME:  42 seconds.  PROCEDURE: The procedure, risks, benefits, and alternatives were explained to the patient. Questions regarding the procedure were encouraged and answered. The patient understands and consents to the procedure.  The right neck and chest were prepped with chlorhexidine in a sterile fashion, and a sterile drape was applied covering the operative field. Maximum barrier sterile  technique with sterile gowns and gloves were used for the procedure. Local anesthesia was provided with 1% lidocaine.  Ultrasound was used to confirm patency of the right internal jugular vein. After creating a small venotomy incision, a 21 gauge needle was advanced into the right internal jugular vein under direct, real-time ultrasound guidance. Ultrasound image documentation was performed. After securing guidewire access, an 8 Fr dilator was placed. A J-wire was kinked to measure appropriate catheter length.  A Bard Equistream tunneled hemodialysis/pheresis catheter measuring 19 cm from tip to cuff was chosen for placement. This was tunneled in a retrograde fashion from the chest wall to the  venotomy incision.  At the venotomy, serial dilatation was performed and a 16 Fr peel-away sheath was placed over a guidewire. The catheter was then placed through the sheath and the sheath removed. Final catheter positioning was confirmed and documented with a fluoroscopic spot image. The catheter was aspirated, flushed with saline, and injected with appropriate volume heparin dwells.  The venotomy incision was closed with subcutaneous 3-0 Monocryl and subcuticular 4-0 Vicryl. Dermabond was applied to the incision. The catheter exit site was secured with 0-Prolene retention sutures.  COMPLICATIONS: None.  No pneumothorax.  FINDINGS: After catheter placement, the tips lie in the right atrium. The catheter aspirates normally and is ready for immediate use.  IMPRESSION: Placement of tunneled pheresis catheter via the right internal jugular vein. The catheter tips lie in the right atrium. The catheter is ready for immediate use.   Electronically Signed   By: Irish Lack M.D.   On: 06/20/2013 14:27   Dg Chest Portable 1 View  07/17/2013   CLINICAL DATA:  Abdominal pain, tachycardia.  EXAM: PORTABLE CHEST - 1 VIEW  COMPARISON:  CT ANGIO CHEST W/CM &/OR WO/CM dated 07/11/2013; DG CHEST 1V PORT dated 07/11/2013  FINDINGS: Moderate right pleural effusion, likely slightly increased since prior study. Interval removal of right hemodialysis catheter. Right PICC line is unchanged. Patchy right lung airspace disease and left basilar airspace disease medially, both slightly increased since prior study. No acute bony abnormality.  IMPRESSION: Increasing patchy bilateral airspace disease as above. Increasing moderate right effusion.   Electronically Signed   By: Charlett Nose M.D.   On: 07/17/2013 03:54   Dg Chest Port 1 View  07/11/2013   CLINICAL DATA:  Shortness of breath, evaluate pulmonary infiltrates  EXAM: PORTABLE CHEST - 1 VIEW  COMPARISON:  DG CHEST 1V PORT dated 07/10/2013; DG CHEST 1V PORT dated 07/03/2013; CT CHEST  W/CM dated 06/21/2013; IR FLUORO GUIDE CV LINE*R* dated 06/20/2013; DG CHEST 1V PORT dated 05/11/2013  FINDINGS: Grossly unchanged cardiac silhouette and mediastinal contours given patient rotation. Stable position of support apparatus. Grossly unchanged right mid lung heterogeneous airspace opacities with worsening bilateral medial basilar airspace opacities. No definite pleural effusion or pneumothorax. No evidence of edema. A minimal amount of enteric contrast is seen within in the gastric fundus. Post cholecystectomy. Grossly unchanged bones.  IMPRESSION: 1.  Stable positioning of support apparatus.  No pneumothorax. 2. Worsening bibasilar airspace disease, possibly atelectasis though progression of multifocal infection / aspiration is not excluded. A follow-up chest radiograph in 4 to 6 weeks after treatment is recommended to ensure resolution.   Electronically Signed   By: Simonne Come M.D.   On: 07/11/2013 07:47   Dg Chest Port 1 View  07/10/2013   CLINICAL DATA:  Shortness of breath.  Hypotension  EXAM: PORTABLE CHEST - 1 VIEW  COMPARISON:  DG CHEST 1V PORT  dated 07/03/2013; CT CHEST W/CM dated 06/21/2013  FINDINGS: Right internal jugular dialysis catheter tip: Right atrium. Thoracic spondylosis noted. There is a band of airspace opacity in the right upper lobe just above the minor fissure. Improved aeration at the right lung base.  IMPRESSION: Improved aeration at the right lung base, with but there is a band of airspace opacity in the right upper lobe favoring atelectasis over pneumonia. Next item thoracic spondylosis.   Electronically Signed   By: Herbie Baltimore M.D.   On: 07/10/2013 08:27   Dg Chest Port 1 View  07/03/2013   CLINICAL DATA:  Tubes and lines in good position, detailed above.  EXAM: PORTABLE CHEST - 1 VIEW  COMPARISON:  Chest CT 06/21/2013  FINDINGS: There is nasogastric tube which terminates near the pylorus. Right IJ catheter, tip in the upper right atrium. Right upper extremity PICC,  tip at the upper cavoatrial junction.  Oral contrast noted within the gastric fundus and small bowel.  Haziness of the right base, with volume loss. Node definitive consolidation.  No cardiomegaly.  IMPRESSION: 1. Negative for pneumonia. 2. Haziness of the right lower chest, likely pleural fluid or atelectasis. 3. Tubes and lines in good position, detailed above.   Electronically Signed   By: Tiburcio Pea M.D.   On: 07/03/2013 03:35   Dg Abd Portable 1v  07/17/2013   CLINICAL DATA:  J-tube placement.  EXAM: PORTABLE ABDOMEN - 1 VIEW  COMPARISON:  07/13/2013  FINDINGS: Contrast was injected through the jejunostomy tube. This opacifies small bowel loops in the upper pelvis. No evidence of contrast extravasation.  Slightly prominent bowel loops or superiorly in the abdomen, nonspecific. Prior cholecystectomy. No free air.  IMPRESSION: Jejunostomy tube within upper pelvic small bowel loops. Mildly prominent abdominal small bowel loops which could reflect mild focal ileus although early or partial small bowel obstruction cannot be excluded.   Electronically Signed   By: Charlett Nose M.D.   On: 07/17/2013 03:57   Dg Abd Portable 1v  07/12/2013   CLINICAL DATA:  Abdominal pain  EXAM: PORTABLE ABDOMEN - 1 VIEW  COMPARISON:  07/11/2013  FINDINGS: Postsurgical changes are again seen. A feeding catheter is noted. Scattered large and small bowel gas is seen. No obstructive changes are noted. No definitive free air is seen.  IMPRESSION: No significant change from the prior exam.   Electronically Signed   By: Alcide Clever M.D.   On: 07/12/2013 07:44   Dg Abd Portable 1v  07/11/2013   CLINICAL DATA:  Abdominal distention, evaluate for small bowel obstruction  EXAM: PORTABLE ABDOMEN - 1 VIEW  COMPARISON:  CT ABD/PELV WO CM dated 07/10/2013; DG ABD PORTABLE 1V dated 07/10/2013; DG ABD PORTABLE 1V dated 06/30/2013  FINDINGS: There has been passage of the majority of previously administered enteric contrast. A minimal amount  of enteric contrast remains within the colon. Paucity of bowel gas without definite evidence of obstruction. Nondiagnostic evaluation pneumoperitoneum secondary supine positioning exclusion of lower thorax. No definite pneumatosis or portal venous gas.  A feeding jejunostomy tube overlies the left mid/ lower abdomen.  A vertically aligned skin staples overlie the right mid abdomen. Post cholecystectomy.  Unchanged bones.  IMPRESSION: Paucity of bowel gas, however there has been apparent passage of previously ingested enteric contrast. No definite evidence of obstruction.   Electronically Signed   By: Simonne Come M.D.   On: 07/11/2013 08:26   Dg Abd Portable 1v  07/10/2013   CLINICAL DATA:  Assess for  ileus  EXAM: PORTABLE ABDOMEN - 1 VIEW  COMPARISON:  June 30, 2013  FINDINGS: There is persistent air-filled dilated small bowel loops in the left abdomen. Air is noted in the colon. Skin staples are projected over the right abdomen. Patient is status post prior cholecystectomy.  IMPRESSION: Persistent ileus.   Electronically Signed   By: Sherian Rein M.D.   On: 07/10/2013 02:30   Dg Abd Portable 1v  06/30/2013   CLINICAL DATA:  NG tube placement.  EXAM: PORTABLE ABDOMEN - 1 VIEW  COMPARISON:  Single view of the abdomen 06/27/2013. Upper GI/small bowel follow-through 06/28/2013  FINDINGS: NG tube is in place in good position with the tip in this distal stomach. Contrast material in small bowel from the comparison upper GI series/small bowel follow-through. Small bowel loops are dilated. Only a small amount of loculated barium is seen in the colon.  IMPRESSION: NG tube in good position.  Small bowel obstruction.   Electronically Signed   By: Drusilla Kanner M.D.   On: 06/30/2013 19:01   Dg Ugi W/small Bowel  06/28/2013   CLINICAL DATA:  53 year old female postop 2 days since percutaneous jejunostomy tube placement. Drainage from NG tube. Abdominal pain. Initial encounter.  EXAM: UPPER GI SERIES WITH SMALL  BOWEL FOLLOW-THROUGH  FLUOROSCOPIC GUIDED NG TUBE PLACEMENT  TECHNIQUE: Combined double contrast and single contrast upper GI series using effervescent crystals, thick barium, and thin barium. Subsequently, serial images of the small bowel were obtained including spot views of the terminal ileum.  COMPARISON:  KUB 06/27/2013 and earlier.  FLUOROSCOPY TIME:  9 min and 18 seconds.  FINDINGS: Preprocedural scout view of the abdomen demonstrates a small volume of loculated barium in the colon. This is related to the 06/17/2013 NG tube placement/ confirmation study.  Scout view demonstrates the recently placed right an mid lower abdomen percutaneous jejunostomy. Right lower abdomen skin staples in place.  The scout view also demonstrates the patient's NG tube is looped back up into the esophagus. This was evaluated in real-time with fluoroscopy, and a wire was placed through the tube in an effort to maneuver the tip back into the stomach. However, this was unsuccessful an the tube had be retracted into the nasopharynx. Using fluoroscopic guidance, the tube was then advanced with a wire into the stomach, such that the tip and side hole are within the stomach.  Given the recent abdominal surgery, initially water-soluble contrast was planned, however prominent gastroesophageal reflux of contrast was noted early on, and the decision was made to switch contrast to thin barium. Water-soluble contrast was fully aspirated from the stomach. Subsequently, barium contrast was slowly administered.  The stomach appears small. No gastrojejunostomy is evident. After a short delay, barium emptied the stomach into the proximal duodenum. The second portion of the duodenum is dilated.  After 20 additional min significantly dilated proximal jejunum is opacified, with the small bowel caliber up to 58 mm.  At this point study was discussed with Dr. Jimmye Norman . We decided to inject the percutaneous jejunostomy tube, and give the barium more  time to opacified a small bowel in hopes of loose to dating the small bowel obstruction transition point.  Barium injection through the jejunostomy tube demonstrates normal small bowel loops at the jejunostomy site. This barium was then aspirated.  Subsequently a 3 hr delayed image was obtained demonstrating a gradual tapering of small bowel loops into the right lower abdomen, heading toward the vicinity of the jejunostomy. These smaller loops  measure 23 mm diameter (arrow).  At this point, Dr. Lindie Spruce advised that no additional imaging was needed.  IMPRESSION: 1. High-grade small bowel obstruction. Dilated duodenum and proximal jejunum up to 58 mm diameter. 2. Small bowel loops slowly taper to the right lower abdomen in the vicinity of the percutaneous jejunostomy. 3. The jejunostomy was injected with contrast, and is situated within normal decompressed small bowel. 4. Diminutive stomach. No gastrojejunostomy is evident. Prominent gastroesophageal reflux. 5. At the beginning of the exam the patient's NG tube was malpositioned. This was withdrawn to the nasopharynx and repositioned into the stomach under fluoroscopic guidance.   Electronically Signed   By: Augusto Gamble M.D.   On: 06/28/2013 16:29         Subjective: Patient is much more alert today. She denies any headache, visual disturbance, fevers, chills, chest pain, shortness breath, coughing, hemoptysis, dysuria. She complains of intermittent epigastric abdominal discomfort. She had one episode of emesis late last night without any emesis currently.  Objective: Filed Vitals:   07/17/13 1143 07/17/13 1959 07/18/13 1130 07/18/13 1310  BP: 145/91 135/82 130/105 105/57  Pulse:  88 82 97  Temp:  98.2 F (36.8 C) 97.8 F (36.6 C) 98.1 F (36.7 C)  TempSrc:  Oral Oral Oral  Resp:   20 22  Weight:      SpO2:  98% 96%     Intake/Output Summary (Last 24 hours) at 07/18/13 1606 Last data filed at 07/18/13 1342  Gross per 24 hour  Intake   1170 ml   Output      0 ml  Net   1170 ml   Weight change:  Exam:   General:  Pt is alert, follows commands appropriately, not in acute distress  HEENT: No icterus, No thrush, No meningismus, [place]/AT  Cardiovascular: RRR, S1/S2, no rubs, no gallops  Respiratory: Bibasilar crackles without any wheezing. Good air movement.  Abdomen: Soft/+BS, non tender, non distended, no guarding; mild epigastric tenderness to palpation without any guarding or peritoneal signs. Jejunostomy tube site is without any erythema. Sutures are intact.  Extremities: 1+LE edema, No lymphangitis, No petechiae, No rashes, no synovitis  Data Reviewed: Basic Metabolic Panel:  Recent Labs Lab 07/12/13 0500 07/13/13 0500 07/14/13 0415 07/15/13 0500 07/15/13 1830 07/16/13 0550 07/17/13 0335 07/18/13 0615  NA 139 144 146 143  --   --  144 142  K 4.5 3.2* 3.1* 2.5* 4.5 3.8 3.7 3.7  CL 106 111 112 109  --   --  108 106  CO2 22 24 26 24   --   --  24 26  GLUCOSE 95 89 81 101*  --   --  85 86  BUN 6 4* 3* <3*  --   --  4* 3*  CREATININE 0.51 0.51 0.59 0.55  --   --  0.55 0.54  CALCIUM 8.3* 7.7* 7.9* 7.7*  --   --  8.0* 7.9*  MG 1.8  --  2.0  --   --  1.9  --   --    Liver Function Tests:  Recent Labs Lab 07/12/13 0500 07/13/13 0500 07/14/13 0415 07/15/13 0500 07/17/13 0335  AST 19 15 16 15 21   ALT 10 9 9 9 10   ALKPHOS 61 53 49 55 68  BILITOT 0.5 0.3 0.3 0.3 0.5  PROT 4.6* 4.2* 4.0* 4.3* 4.9*  ALBUMIN 2.0* 1.8* 1.8* 1.9* 2.3*   No results found for this basename: LIPASE, AMYLASE,  in the last 168 hours  Recent Labs Lab 07/17/13 1320  AMMONIA 17   CBC:  Recent Labs Lab 07/12/13 0500 07/13/13 0500 07/14/13 0415 07/17/13 0335 07/18/13 0615  WBC 4.8 5.0 4.7 8.9 7.7  NEUTROABS  --   --   --  6.3  --   HGB 11.4* 10.7* 10.3* 11.5* 10.8*  HCT 33.5* 31.5* 30.2* 34.1* 31.9*  MCV 90.3 90.8 91.2 94.7 94.9  PLT 123* 153 144* 130* 143*   Cardiac Enzymes: No results found for this basename: CKTOTAL,  CKMB, CKMBINDEX, TROPONINI,  in the last 168 hours BNP: No components found with this basename: POCBNP,  CBG:  Recent Labs Lab 07/16/13 0026 07/16/13 0810 07/17/13 2020 07/18/13 0003 07/18/13 0353  GLUCAP 102* 87 78 107* 109*    Recent Results (from the past 240 hour(s))  URINE CULTURE     Status: None   Collection Time    07/10/13  8:15 AM      Result Value Ref Range Status   Specimen Description URINE, CATHETERIZED   Final   Special Requests NONE   Final   Culture  Setup Time     Final   Value: 07/10/2013 09:13     Performed at [facility] Count     Final   Value: 50,000 COLONIES/ML     Performed at [facility]   Culture     Final   Value: KLEBSIELLA PNEUMONIAE     YEAST     Performed at [facility]   Report Status 07/13/2013 FINAL   Final   Organism ID, Bacteria KLEBSIELLA PNEUMONIAE   Final  CLOSTRIDIUM DIFFICILE BY PCR     Status: None   Collection Time    07/10/13  8:33 AM      Result Value Ref Range Status   C difficile by pcr NEGATIVE  NEGATIVE Final  CULTURE, BLOOD (ROUTINE X 2)     Status: None   Collection Time    07/10/13 10:55 AM      Result Value Ref Range Status   Specimen Description BLOOD LEFT ARM   Final   Special Requests BOTTLES DRAWN AEROBIC ONLY 8CC   Final   Culture  Setup Time     Final   Value: 07/10/2013 16:23     Performed at [facility]   Culture     Final   Value: KLEBSIELLA PNEUMONIAE     Note: Gram Stain Report Called to,Read Back By and Verified With: HOLLY CHURCH 07/11/13 AT 0330 RIDK     Performed at [facility]   Report Status 07/13/2013 FINAL   Final   Organism ID, Bacteria KLEBSIELLA PNEUMONIAE   Final  MRSA PCR SCREENING     Status: None   Collection Time    07/10/13 12:55 PM      Result Value Ref Range Status   MRSA by PCR NEGATIVE  NEGATIVE Final   Comment:            The GeneXpert MRSA Assay (FDA     approved for NASAL specimens     only), is one component of a      comprehensive MRSA colonization     surveillance program. It is not     intended to diagnose MRSA     infection nor to guide or     monitor treatment for     MRSA infections.  CULTURE, BLOOD (ROUTINE X 2)     Status: None   Collection Time  07/12/13 10:40 AM      Result Value Ref Range Status   Specimen Description BLOOD LEFT FOREARM   Final   Special Requests BOTTLES DRAWN AEROBIC AND ANAEROBIC 10CC   Final   Culture  Setup Time     Final   Value: 07/12/2013 14:02     Performed at [facility]   Culture     Final   Value: NO GROWTH 5 DAYS     Performed at [facility]   Report Status 07/18/2013 FINAL   Final  CULTURE, BLOOD (ROUTINE X 2)     Status: None   Collection Time    07/12/13 10:50 AM      Result Value Ref Range Status   Specimen Description BLOOD LEFT ARM   Final   Special Requests BOTTLES DRAWN AEROBIC AND ANAEROBIC 10CC   Final   Culture  Setup Time     Final   Value: 07/12/2013 14:02     Performed at [facility]   Culture     Final   Value: NO GROWTH 5 DAYS     Performed at [facility]   Report Status 07/18/2013 FINAL   Final  CULTURE, BLOOD (ROUTINE X 2)     Status: None   Collection Time    07/17/13  3:35 AM      Result Value Ref Range Status   Specimen Description BLOOD RIGHT PICC LINE   Final   Special Requests BOTTLES DRAWN AEROBIC AND ANAEROBIC 10CC   Final   Culture  Setup Time     Final   Value: 07/17/2013 08:11     Performed at [facility]   Culture     Final   Value:        BLOOD CULTURE RECEIVED NO GROWTH TO DATE CULTURE WILL BE HELD FOR 5 DAYS BEFORE ISSUING A FINAL NEGATIVE REPORT     Performed at [facility]   Report Status PENDING   Incomplete  CULTURE, BLOOD (ROUTINE X 2)     Status: None   Collection Time    07/17/13  3:52 AM      Result Value Ref Range Status   Specimen Description BLOOD LEFT ARM   Final   Special Requests BOTTLES DRAWN AEROBIC ONLY 10CC   Final   Culture   Setup Time     Final   Value: 07/17/2013 08:11     Performed at [facility]   Culture     Final   Value:        BLOOD CULTURE RECEIVED NO GROWTH TO DATE CULTURE WILL BE HELD FOR 5 DAYS BEFORE ISSUING A FINAL NEGATIVE REPORT     Performed at [facility]   Report Status PENDING   Incomplete  URINE CULTURE     Status: None   Collection Time    07/17/13  4:27 AM      Result Value Ref Range Status   Specimen Description URINE, CATHETERIZED   Final   Special Requests Normal   Final   Culture  Setup Time     Final   Value: 07/16/2013 03:22     Performed at [facility] Count     Final   Value: NO GROWTH     Performed at [facility]   Culture     Final   Value: NO GROWTH     Performed at [facility]   Report  Status 07/18/2013 FINAL   Final  CLOSTRIDIUM DIFFICILE BY PCR     Status: None   Collection Time    07/17/13 10:30 AM      Result Value Ref Range Status   C difficile by pcr NEGATIVE  NEGATIVE Final     Scheduled Meds: . atenolol  50 mg Oral BID  . chlorhexidine  15 mL Mouth/Throat BID  . ciprofloxacin  400 mg Intravenous Q12H  . enoxaparin (LOVENOX) injection  40 mg Subcutaneous Q24H  . ferrous sulfate  325 mg Oral BID WC  . folic acid  1 mg Oral Daily  . geriatric multivitamins-minerals  15 mL Oral Daily  . levETIRAcetam  1,000 mg Oral BID  . mirtazapine  30 mg Oral QHS  . pantoprazole sodium  40 mg Per Tube Q1200  . potassium chloride  40 mEq Oral BID  . risperiDONE  0.5 mg Oral BID  . sodium chloride  10-40 mL Intracatheter Q12H  . thiamine  100 mg Oral Daily  . valproic acid  500 mg Oral TID   Continuous Infusions: . feeding supplement (VITAL AF 1.2 CAL) 1,000 mL (07/18/13 1518)     Linley Moxley, DO  [facility] Pager [phone]  If 7PM-7AM, please contact night-coverage [email or web address] Password [email or web address] 07/18/2013, 4:06 PM   LOS: 1 day

## 2013-07-18 NOTE — Progress Notes (Signed)
1800 CBG result 123 no sign of fypoglycemia . PT ask for INsulin coverage non found. Made Dr pt aware PT got mad andbelligernt verbally tthreatened Me that her sister will woopp Isaid any form threatening is not permitted by the institution. Consulting civil engineerCharge RN and co nurses aware

## 2013-07-18 NOTE — Progress Notes (Addendum)
Pt. With CT of abdomen with contrast scheduled this am. Pt. Currently NPO due to nausea and vomiting. On call NP, L.. Easterwood notified. RN instructed to inform oncoming RN of pt. Situation and to contact surgery before CT is completed. Hold on CT of abdomen.

## 2013-07-19 ENCOUNTER — Inpatient Hospital Stay (HOSPITAL_COMMUNITY): Payer: Medicare Other

## 2013-07-19 DIAGNOSIS — R112 Nausea with vomiting, unspecified: Secondary | ICD-10-CM

## 2013-07-19 LAB — BASIC METABOLIC PANEL
BUN: 3 mg/dL — ABNORMAL LOW (ref 6–23)
CHLORIDE: 107 meq/L (ref 96–112)
CO2: 25 meq/L (ref 19–32)
Calcium: 8 mg/dL — ABNORMAL LOW (ref 8.4–10.5)
Creatinine, Ser: 0.53 mg/dL (ref 0.50–1.10)
GFR calc Af Amer: >90 mL/min (ref >90)
GFR calc non Af Amer: >90 mL/min (ref >90)
Glucose, Bld: 93 mg/dL (ref 70–99)
Potassium: 4.4 mEq/L (ref 3.7–5.3)
SODIUM: 143 meq/L (ref 137–147)

## 2013-07-19 LAB — GLUCOSE, CAPILLARY
Glucose-Capillary: 87 mg/dL (ref 70–99)
Glucose-Capillary: 87 mg/dL (ref 70–99)
Glucose-Capillary: 90 mg/dL (ref 70–99)
Glucose-Capillary: 90 mg/dL (ref 70–99)
Glucose-Capillary: 91 mg/dL (ref 70–99)
Glucose-Capillary: 93 mg/dL (ref 70–99)
Glucose-Capillary: 93 mg/dL (ref 70–99)

## 2013-07-19 LAB — CBC
HEMATOCRIT: 31.2 % — AB (ref 36.0–46.0)
Hemoglobin: 10.2 g/dL — ABNORMAL LOW (ref 12.0–15.0)
MCH: 31.6 pg (ref 26.0–34.0)
MCHC: 32.7 g/dL (ref 30.0–36.0)
MCV: 96.6 fL (ref 78.0–100.0)
Platelets: 137 10*3/uL — ABNORMAL LOW (ref 150–400)
RBC: 3.23 MIL/uL — ABNORMAL LOW (ref 3.87–5.11)
RDW: 19.6 % — ABNORMAL HIGH (ref 11.5–15.5)
WBC: 6.1 10*3/uL (ref 4.0–10.5)

## 2013-07-19 MED ORDER — METOPROLOL TARTRATE 50 MG PO TABS
50.0000 mg | ORAL_TABLET | Freq: Two times a day (BID) | ORAL | Status: DC
  Administered 2013-07-19 – 2013-07-20 (×4): 50 mg via ORAL
  Filled 2013-07-19 (×6): qty 1

## 2013-07-19 MED ORDER — METOPROLOL TARTRATE 50 MG PO TABS: 50.0000 mg | ORAL_TABLET | Freq: Two times a day (BID) | ORAL | Status: DC

## 2013-07-19 MED ORDER — METOCLOPRAMIDE HCL 5 MG/ML IJ SOLN
5.0000 mg | Freq: Three times a day (TID) | INTRAMUSCULAR | Status: DC
  Administered 2013-07-19 – 2013-07-22 (×9): 5 mg via INTRAVENOUS
  Filled 2013-07-19 (×12): qty 1

## 2013-07-19 MED ORDER — VALPROIC ACID 250 MG PO CAPS: 500.0000 mg | ORAL_CAPSULE | Freq: Three times a day (TID) | ORAL | Status: DC

## 2013-07-19 NOTE — Clinical Social Work Placement (Addendum)
    Clinical Social Work Department CLINICAL SOCIAL WORK PLACEMENT NOTE  Patient:  Joan [name],Joan [name]  Account Number:  [id number] Admit date:  07/17/2013  Clinical Social Worker:  Lupita LeashNNA Jaamal Farooqui, LCSWA  Date/time:  07/19/2013 04:47 PM  Clinical Social Work is seeking post-discharge placement for this patient at the following level of care:   SKILLED NURSING   (*CSW will update this form in Epic as items are completed)   07/18/2013  Patient/family provided with [facility] Department of Clinical Social Work's list of facilities offering this level of care within the geographic area requested by the patient (or if unable, by the patient's family).  07/18/2013  Patient/family informed of their freedom to choose among providers that offer the needed level of care, that participate in Medicare, Medicaid or managed care program needed by the patient, have an available bed and are willing to accept the patient.    Patient/family informed of [facility]' ownership interest in [facility], as well as of the fact that they are under no obligation to receive care at this facility.  PASARR submitted to EDS on  PASARR number received from EDS on   FL2 transmitted to all facilities in geographic area requested by pt/family on  07/18/2013 FL2 transmitted to all facilities within larger geographic area on   Patient informed that his/her managed care company has contracts with or will negotiate with  certain facilities, including the following:   Has existing pasarr     Patient/family informed of bed offers received:  07/19/2013 [facility] Physician recommends and patient chooses bed at    [facility]   Patient to be transferred to facility by [facility])  The following physician request were entered in Epic:   Additional Comments: [facility]  [facility] but son was insistent that patient not return there as he felt that she was not well managed at the facility.  [facility] and [facility]. Patient is poorly responsive and unable to participate in plan of care.  Nurisng notified to call report. CSW signing off.  Lorri Frederickonna T. Sian Joles, LCSWA  [phone]

## 2013-07-19 NOTE — Evaluation (Signed)
Physical Therapy Evaluation Patient Details Name: Joan [name] MRN: [id number] DOB: 11/11/1960 Today's Date: 07/19/2013 Time: [id number] PT Time Calculation (min): 21 min  PT Assessment / Plan / Recommendation History of Present Illness  53 year old female with a history of seizure disorder, hypertension, peripheral neuropathy, meningoencephalitis of unclear etiology [facility] with tachycardia and dislodged J-tube. The patient was recently discharged from the hospital after a prolonged stay from 06/19/2013 through 07/16/2013 due to bowel obstruction with J-tube revision, bacteremia, UTI, meningoencephalitis, and protein calorie malnutrition. The patient was discharged to her skilled nursing facility with instructions for 10 additional days of ciprofloxacin through her PICC line  Clinical Impression  Pt familiar from recent admission. Pt with gown, linen, and abdominal binder soiled on arrival with assist for rolling and bed mobility to assist with pericare and linen change with RN notified. Did not attempt sitting or transfer OOB today due to limited bed mobility and need for additional binder. Pt with below deficits and will benefit from acute therapy to trail to maximize mobility to decrease burden of care at discharge. Recommend OOB daily with lift for nursing. Pt also with sacral area noted to begin breakdown with barrier cream applied and RN aware.     PT Assessment  Patient needs continued PT services    Follow Up Recommendations  SNF    Does the patient have the potential to tolerate intense rehabilitation      Barriers to Discharge        Equipment Recommendations  None recommended by PT    Recommendations for Other Services     Frequency Min 2X/week    Precautions / Restrictions Precautions Precautions: Fall   Pertinent Vitals/Pain HR 101-108 with activity Pt somewhat anxious when foot dropped off bed with rolling.      Mobility  Bed Mobility Overal bed  mobility: Needs Assistance Bed Mobility: Rolling Rolling: Mod assist  Performed bil directions x 2 with pt able to maintain sidelying with rail and verbal cues.    Exercises     PT Diagnosis: Generalized weakness;Altered mental status  PT Problem List: Decreased strength;Decreased cognition;Decreased range of motion;Decreased activity tolerance;Decreased mobility;Cardiopulmonary status limiting activity PT Treatment Interventions: Functional mobility training;Therapeutic activities;Therapeutic exercise;Patient/family education;Balance training     PT Goals(Current goals can be found in the care plan section) Acute Rehab PT Goals PT Goal Formulation: Patient unable to participate in goal setting Time For Goal Achievement: 08/02/13 Potential to Achieve Goals: Fair  Visit Information  Last PT Received On: 07/19/13 Assistance Needed: +1 (bed mobliltiy, 2 for transfers OOB) History of Present Illness: 53 year old female with a history of seizure disorder, hypertension, peripheral neuropathy, meningoencephalitis of unclear etiology [facility] tachycardia and dislodged J-tube. The patient was recently discharged from the hospital after a prolonged stay from 06/19/2013 through 07/16/2013 due to bowel obstruction with J-tube revision, bacteremia, UTI, meningoencephalitis, and protein calorie malnutrition. The patient was discharged to her skilled nursing facility with instructions for 10 additional days of ciprofloxacin through her PICC line       Prior Functioning  Home Living Family/patient expects to be discharged to:: Skilled nursing facility Prior Function Level of Independence: Needs assistance Gait / Transfers Assistance Needed: pt reports 2 person assist to [facility] / Homemaking Assistance Needed: total assist for ADLs and staff does all housework Communication Communication: No difficulties Dominant Hand: Right    Cognition   Cognition Arousal/Alertness: Awake/alert Behavior During Therapy: Flat affect Overall Cognitive Status: No family/caregiver  present to determine baseline cognitive functioning Area of Impairment: Orientation;Safety/judgement;Following commands Orientation Level: Time;Situation;Place Memory: Decreased short-term memory Following Commands: Follows one step commands with increased time Safety/Judgement: Decreased awareness of safety;Decreased awareness of deficits    Extremity/Trunk Assessment Upper Extremity Assessment Upper Extremity Assessment: Generalized weakness Lower Extremity Assessment Lower Extremity Assessment: Generalized weakness   Balance    End of Session PT - End of Session Activity Tolerance: Patient tolerated treatment well Patient left: in bed;with nursing/sitter in room Nurse Communication: Mobility status;Need for lift equipment  GP     Joan [name] 07/19/2013, 10:33 AM Joan [name], PT [phone]

## 2013-07-19 NOTE — Progress Notes (Signed)
I was contacted by RN that pt vomited x 2 this pm with bilious material.  Pt c/o abdominal pain, but denies f/c, cp, sob.  +BM noted this pm  98.7 F (37.1 C) 98 --  20 115/111 mmHg-- 98 %  RRR Diminish BS at bases without wheeze Soft/+BS, nondistended, lower abd tender without rebound or peritoneal signs Ext--1+ edema  --cancel discharge --2 view abd xray --check UA with reflex to urine culture --slow TF to 30cc/hr -start reglan -zofran ordered.  DTat

## 2013-07-19 NOTE — Progress Notes (Signed)
CSW spoke to patient's son- Doylene BodeMarcus Powell and bed offers provided as he does not with patient to [facility].  [facility] and d/c was planned for today via EMS. Prior to pick up- nursing reported that patient has had 2 episodes of vomiting and Dr. Arbutus Leasat notified.  He has cancelled d/c for today- will re-evaluate in the a.m. Will ask weekend CSW to follow up with MD to determine medical stability.  Notified patient's so [name] of above.  Lorri Frederickonna T. [place] P[place], LCSWA  [phone]

## 2013-07-19 NOTE — Progress Notes (Signed)
Pt had 2 episodes of bile emesis. C/O pain in stomach.  Dr. Arbutus Leasat in to see pt and orders in progress.  Pt medicated with zofran IV and Tylenol po for nausea and pain.  Discharge to [facility] cancelled. Pt informed.

## 2013-07-20 ENCOUNTER — Inpatient Hospital Stay (HOSPITAL_COMMUNITY): Payer: Medicare Other

## 2013-07-20 LAB — CBC
HCT: 30.6 % — ABNORMAL LOW (ref 36.0–46.0)
Hemoglobin: 10 g/dL — ABNORMAL LOW (ref 12.0–15.0)
MCH: 31.3 pg (ref 26.0–34.0)
MCHC: 32.7 g/dL (ref 30.0–36.0)
MCV: 95.9 fL (ref 78.0–100.0)
PLATELETS: 136 10*3/uL — AB (ref 150–400)
RBC: 3.19 MIL/uL — ABNORMAL LOW (ref 3.87–5.11)
RDW: 19.7 % — AB (ref 11.5–15.5)
WBC: 6.8 10*3/uL (ref 4.0–10.5)

## 2013-07-20 LAB — URINALYSIS, ROUTINE W REFLEX MICROSCOPIC
Bilirubin Urine: NEGATIVE
GLUCOSE, UA: NEGATIVE mg/dL
Hgb urine dipstick: NEGATIVE
Ketones, ur: NEGATIVE mg/dL
LEUKOCYTES UA: NEGATIVE
Nitrite: NEGATIVE
PROTEIN: NEGATIVE mg/dL
Specific Gravity, Urine: 1.023 (ref 1.005–1.030)
Urobilinogen, UA: 1 mg/dL (ref 0.0–1.0)
pH: 5.5 (ref 5.0–8.0)

## 2013-07-20 LAB — GLUCOSE, CAPILLARY
GLUCOSE-CAPILLARY: 98 mg/dL (ref 70–99)
GLUCOSE-CAPILLARY: 85 mg/dL (ref 70–99)
Glucose-Capillary: 78 mg/dL (ref 70–99)
Glucose-Capillary: 99 mg/dL (ref 70–99)
Glucose-Capillary: 87 mg/dL (ref 70–99)

## 2013-07-20 LAB — COMPREHENSIVE METABOLIC PANEL
ALBUMIN: 2.2 g/dL — AB (ref 3.5–5.2)
ALK PHOS: 78 U/L (ref 39–117)
ALT: 10 U/L (ref 0–35)
AST: 22 U/L (ref 0–37)
BUN: 5 mg/dL — ABNORMAL LOW (ref 6–23)
CO2: 28 mEq/L (ref 19–32)
Calcium: 8.1 mg/dL — ABNORMAL LOW (ref 8.4–10.5)
Chloride: 105 mEq/L (ref 96–112)
Creatinine, Ser: 0.53 mg/dL (ref 0.50–1.10)
GFR calc non Af Amer: >90 mL/min (ref >90)
GLUCOSE: 85 mg/dL (ref 70–99)
Potassium: 4.5 mEq/L (ref 3.7–5.3)
Sodium: 141 mEq/L (ref 137–147)
Total Bilirubin: 0.3 mg/dL (ref 0.3–1.2)
Total Protein: 4.8 g/dL — ABNORMAL LOW (ref 6.0–8.3)

## 2013-07-20 LAB — LIPASE, BLOOD: Lipase: 15 U/L (ref 11–59)

## 2013-07-20 LAB — VALPROIC ACID LEVEL: VALPROIC ACID LVL: 28.5 ug/mL — AB (ref 50.0–100.0)

## 2013-07-20 MED ORDER — SODIUM CHLORIDE 0.9 % IV SOLN
INTRAVENOUS | Status: DC
  Administered 2013-07-20: 13:00:00 via INTRAVENOUS

## 2013-07-20 MED ORDER — IOHEXOL 300 MG/ML  SOLN
100.0000 mL | Freq: Once | INTRAMUSCULAR | Status: AC | PRN
  Administered 2013-07-20: 100 mL via INTRAVENOUS

## 2013-07-20 MED ORDER — TRAMADOL HCL 50 MG PO TABS
50.0000 mg | ORAL_TABLET | Freq: Once | ORAL | Status: AC
Start: 2013-07-20 — End: 2013-07-20
  Administered 2013-07-20: 50 mg via ORAL
  Filled 2013-07-20: qty 1

## 2013-07-20 NOTE — Progress Notes (Signed)
While giving pt. CT Contrast orally pt. Became nauseous, I notified MD and he advised to stop/pause her tube feeding and give pt. Zofran and Reglan and then give the Contrast thru her G. Tube. I gave pt. I.V. Zofran and Reglan and put Contrast in tube a little at a time. Pt. Still became nauseous with Contrast being put in w/gravity and vomited a moderate amount of bile colored and smelling fluid. MD and CT notified and I was advised to push the Contrast even slower thru her tube intermittently until completed. Pt. Showed no other signs or symptoms of acute distress.

## 2013-07-20 NOTE — Progress Notes (Signed)
[facility] PROGRESS NOTE  Kimmberly Wisser ZOX:[id number] DOB: [birth year] DOA: 07/17/2013 PCP: [name], MD  Assessment/Plan: Sinus tachycardia/SIRS  -Tachycardia improving with the restart of atenolol-->the patient was switched to metoprolol tartrate 50 mg twice a day  -According to the medical record this has been a chronic problem for the last several weeks which showed some mild improvement after beta blocker was started on the last admission  -This is likely due to her acute medical illness  -Right upper extremity duplex negative for DVT  -appreciate Dr. Andrey Campanile, general surgery assistance  -abdominal binder at all times  -restart enteral feeds at 60cc/hr--[t had another episode of emesis last night -Continue ciprofloxacin IV--plan to continue until 07/26/2013 if repeat blood cultures are negative--this will signify 14 days of antibiotic therapy from the last negative culture  -Blood cultures--negative  -duplex RUE to r/o DVT  -CT angiogram chest is negative for pulmonary embolus  -Urinalysis negative for pyuria  Acute encephalopathy  -Unclear what the patient's baseline is; however after speaking to the patient's son the patient is near her baseline. The patient's mentation significantly improved from the time of admission.  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 Pt received diatech catheter on 06/20/2013 placement and received and finished plasmapheresis  -speech to eval swallowing--dysphagia 1 diet with thin liquids  -Check serum B12--667  - RBC folate--693  - RPR--NR  -ammonia--17  -Updated information revealed that the patient did not receive any medications during her short stay [name]--question seizure with postictal state as reason for patient's acute encephalopathy  -The patient has outpatient neurology appointment on 52 which she should keep  Recurrent vomiting -ABD xray shows few  scatter fluid levels--?ileus -pt has had BMs -CT abdomen -add reglan -check VPA level and lipase Hx of Convulsions/Seizures  -Pt has a PMH of seizures and catatonia.  - 06/20/13 MRI of the brain showed resolution of thalamic signal abnormality  -continue current AEDs  Hx of Anxiety  -Continue pt on Remeron  -Patient appears to have had a psychiatric history  -Appears to be stable at this time  -Psych prescribed Risperdal 0.5 mg bid previous admission  HTN  -restart atenolol at half home dose and titrate as needed  Malnutrition with history of Gastric Bypass  - encourage PO intake with TF  -J-tube resutured and stable  -Gen Surg consulted, J-tube placed and underwent revision of j tube with exploratory laparotomy on 2/3 and small bowel resection.  -The patient will continue on her enteral feeding Vital AF 1.2@60cc /hr--which the patient tolerated without any further vomiting during the hospitalization  -She is able to take a dysphagia 1 diet by mouth which I encouraged the patient to do  Bacteremia  -On her previous admission, the patient had Klebsiella pneumoniae bacteremia for which she was discharged on ciprofloxacin  -She was continued on ciprofloxacin throughout her hospitalization  -Continue ciprofloxacin as discussed above  -Repeat blood cultures have remained negative.  Thrombocytopenia  -No acute bleeding monitor  -chronic  Pleural effusions  -Currently no respiratory distress  -Oxygen saturation 97-100% on room air  -Remains stable when compared to prior CT angiogram of the chest on 07/11/2013  -repeat CXR 07/19/13 shows improvement of effusions Loose Stools  -Patient had large volume yellow loose stool in the ED  -C. difficile PCR negative  Family Communication: Pt at beside  Disposition Plan: SN[facility] 52 if stable           Procedures/Studies:  Ct Abdomen Pelvis Wo Contrast  07/10/2013   CLINICAL DATA:  Sepsis question intra-abdominal source  EXAM: CT  ABDOMEN AND PELVIS WITHOUT CONTRAST  TECHNIQUE: Multidetector CT imaging of the abdomen and pelvis was performed following the standard protocol without intravenous contrast. Sagittal and coronal MPR images reconstructed from axial data set. GI contrast administered  COMPARISON:  CT pelvis 05/17/2013; CT abdomen and pelvis 06/02/2011  FINDINGS: Small moderate-sized right pleural effusion with basilar atelectasis.  Minimal atelectasis left base.  Gallbladder surgically absent.  Prior gastric surgery.  Jejunostomy tube left mid abdomen, through which GI contrast was administered.  Dilated small bowel loops in the upper abdomen terminating at an anastomotic staple line in the left mid abdomen most compatible with small bowel obstruction ; the lack of dilatation of the remaining small bowel and colon makes ileus much less likely.  Small bowel measures up to 4.8 cm transverse mild bowel wall thickening of the dilated loops.  Distal small bowel is decompressed and unremarkable.  Small foci of gas in the left mid abdomen into the upper left pelvis appear to be within several small bowel loops which are nondistended and no definite free intraperitoneal air is identified.  Decompressed colon containing contrast and a rectal tube.  Unremarkable bladder and ovaries with surgical absence of uterus.  Small amount of free pelvic fluid.  Diffuse soft tissue edema/ anasarca.  No mass, adenopathy, or hernia.  Osseous structures unremarkable.  IMPRESSION: Dilated proximal small bowel loops proximal to the jejunostomy site most consistent with small bowel obstruction, associated with mild bowel wall thickening and duodenal dilatation.  Decompressed distal small bowel and colon.  Small amount of nonspecific free pelvic fluid.   Electronically Signed   By: Ulyses Southward M.D.   On: 07/10/2013 17:38   Dg Abd 1 View  06/27/2013   CLINICAL DATA:  Nausea, abdominal pain, vomiting  EXAM: ABDOMEN - 1 VIEW  COMPARISON:  None.  FINDINGS: There  is a surgical drain present in the mid abdomen. There are surgical stated in the right paramedian abdominal wall. There is a small amount of contrast scattered throughout the colon. There is no bowel dilatation to suggest obstruction. There is no evidence of pneumoperitoneum, portal venous gas or pneumatosis. There are no pathologic calcifications along the expected course of the ureters.The osseous structures are unremarkable.  IMPRESSION: Nonobstructive bowel gas pattern.   Electronically Signed   By: Elige Ko   On: 06/27/2013 09:34   Ct Chest W Contrast  06/21/2013   CLINICAL DATA:  Altered mental status.  Evaluate for malignancy.  EXAM: CT CHEST WITH CONTRAST  TECHNIQUE: Multidetector CT imaging of the chest was performed during intravenous contrast administration.  CONTRAST:  75mL OMNIPAQUE IOHEXOL 300 MG/ML  SOLN  COMPARISON:  Chest x-ray dated 05/11/2013  FINDINGS: Heart size and pulmonary vascularity are normal. There is no hilar or mediastinal adenopathy. The lungs are clear. No mass lesions, infiltrates, or effusions. Soft tissues of the chest are normal. Thyroid gland is normal. No acute osseous abnormality. Osteophytes fuse the thoracic spine from T6 through T10.  IMPRESSION: No evidence of malignancy or other significant abnormality of the chest.   Electronically Signed   By: Geanie Cooley M.D.   On: 06/21/2013 17:13   Ct Angio Chest Pe W/cm &/or Wo Cm  07/17/2013   CLINICAL DATA:  Tachycardia.  Upper chest pain, shortness of breath.  EXAM: CT ANGIOGRAPHY CHEST WITH CONTRAST  TECHNIQUE: Multidetector CT imaging of the chest was performed  using the standard protocol during bolus administration of intravenous contrast. Multiplanar CT image reconstructions and MIPs were obtained to evaluate the vascular anatomy.  CONTRAST:  70mL OMNIPAQUE IOHEXOL 350 MG/ML SOLN  COMPARISON:  DG CHEST 1V PORT dated 07/17/2013; CT ANGIO CHEST W/CM &/OR WO/CM dated 07/11/2013  FINDINGS: No filling defects in the  pulmonary arteries to suggest pulmonary emboli. Heart is normal size. Aorta is normal caliber. No dissection.  Moderate left pleural effusion and large right pleural effusion. These are stable since prior study. Compressive atelectasis in the lower lobes bilaterally. No mediastinal, hilar or axillary adenopathy. Chest wall soft tissues are unremarkable. Imaging into the upper abdomen shows no acute findings.  Review of the MIP images confirms the above findings.  IMPRESSION: No evidence of pulmonary embolus.  Continued bilateral effusions, right greater than left, unchanged. Compressive atelectasis in the lower lobes.  No change since prior study.   Electronically Signed   By: Charlett Nose M.D.   On: 07/17/2013 06:48   Ct Angio Chest Pe W/cm &/or Wo Cm  07/11/2013   CLINICAL DATA:  Altered mental status, tachycardia, chest pain and shortness breath  EXAM: CT ANGIOGRAPHY CHEST WITH CONTRAST  TECHNIQUE: Multidetector CT imaging of the chest was performed using the standard protocol during bolus administration of intravenous contrast. Multiplanar CT image reconstructions and MIPs were obtained to evaluate the vascular anatomy.  CONTRAST:  OMNIPAQUE IOHEXOL 350 MG/ML SOLN  COMPARISON:  DG CHEST 1V PORT dated 07/11/2013; CT ABD/PELV WO CM dated 07/10/2013; CT CHEST W/CM dated 06/21/2013  FINDINGS: There are no filling defects within the pulmonary arteries to suggest acute pulmonary embolism. Acute findings of the aorta or great vessels. No pericardial fluid.  There are bilateral pleural effusions, moderate to large on the right and small on the left. There is associated right lower lobe passive atelectasis.  Limited view of the upper abdomen is unremarkable. Limited view of the skeleton demonstrates degenerative spurring.  Review of the MIP images confirms the above findings.  IMPRESSION: 1. No evidence acute pulmonary embolism. 2. Moderate to large right pleural effusion with associated passive atelectasis. 3. No  pericardial fluid.   Electronically Signed   By: Genevive Bi M.D.   On: 07/11/2013 17:53   Mr Laqueta Jean ZO Contrast  06/20/2013   CLINICAL DATA:  Altered mental status. Possible autoimmune encephalitis.  EXAM: MRI HEAD WITHOUT AND WITH CONTRAST  TECHNIQUE: Multiplanar, multiecho pulse sequences of the brain and surrounding structures were obtained without and with intravenous contrast.  CONTRAST:  15mL MULTIHANCE GADOBENATE DIMEGLUMINE 529 MG/ML IV SOLN  COMPARISON:  Head CT 06/13/2013 and brain MRI 05/16/2013  FINDINGS: Images are mildly to moderately degraded by motion artifact.  Incidental note is again made of a partially empty sella. No definite residual abnormal T2 or diffusion weighted signal is identified in the thalami. Mild periventricular T2 hyperintensity is unchanged and nonspecific. No new areas of brain parenchymal signal abnormality are identified. There is mild cerebral atrophy. There is no evidence of acute infarct. There is no evidence of mass, midline shift, or extra-axial fluid collection. Orbits are unremarkable. Major intracranial vascular flow voids are unremarkable. Visualized paranasal sinuses and mastoid air cells are clear. There is no abnormal enhancement.  IMPRESSION: Interval resolution of thalamic signal abnormality. No evidence of new intracranial abnormality.   Electronically Signed   By: Sebastian Ache   On: 06/20/2013 17:26   Mr Cervical Spine Wo Contrast  06/22/2013   CLINICAL DATA:  Right hemiparesis.  Possible autoimmune encephalitis.  EXAM: MRI CERVICAL SPINE WITHOUT CONTRAST  TECHNIQUE: Multiplanar, multisequence MR imaging was performed. No intravenous contrast was administered.  COMPARISON:  No comparison cervical spine MR. brain MR 06/20/2013.  FINDINGS: On this motion grade examination, artifact extends through the cervical cord however, no definitive cervical cord signal abnormality is noted. Cervical medullary junction unremarkable. Intracranial structures as  detailed on recent MR of the brain.  Visualized paravertebral structures unremarkable. Both vertebral arteries are patent.  C2-3:  Negative.  C3-4:  Minimal right foraminal narrowing.  C4-5:  Minimal right foraminal narrowing.  C5-6:  Negative.  C6-7:  Negative.  C7-T1:  Negative.  T1-2:  Negative.  T2-3: Minimal Schmorl's node deformity.  Minimal bulge.  T3-4: Minimal bulge.  T4-5: Minimal bulge.  IMPRESSION: No evidence of cervical disc herniation.  Minimal degenerative changes upper thoracic spine.  Please see above.   Electronically Signed   By: Bridgett Larsson M.D.   On: 06/22/2013 19:38   US Transvaginal Non-ob  06/21/2013   CLINICAL DATA:  Altered mental status. Evaluation for possible paraneoplastic syndrome.  EXAM: TRANSABDOMINAL AND TRANSVAGINAL ULTRASOUND OF PELVIS  TECHNIQUE: Both transabdominal and transvaginal ultrasound examinations of the pelvis were performed. Transabdominal technique was performed for global imaging of the pelvis including uterus, ovaries, adnexal regions, and pelvic cul-de-sac. It was necessary to proceed with endovaginal exam following the transabdominal exam to visualize the ovaries.  COMPARISON:  None  FINDINGS: Uterus  Removed.  Right ovary  Not visualized.  Left ovary  Not visualized.  Other findings  No free fluid.  IMPRESSION: The ovaries could not be visualized on transvaginal or transabdominal pelvic ultrasound. However, review of the CT scan of the pelvis dated 05/17/2013 does demonstrate that both ovaries appear normal, measuring 23 mm on the right and 16 mm on the left.   Electronically Signed   By: Geanie Cooley M.D.   On: 06/21/2013 16:45   US Pelvis Complete  06/21/2013   CLINICAL DATA:  Altered mental status. Evaluation for possible paraneoplastic syndrome.  EXAM: TRANSABDOMINAL AND TRANSVAGINAL ULTRASOUND OF PELVIS  TECHNIQUE: Both transabdominal and transvaginal ultrasound examinations of the pelvis were performed. Transabdominal technique was performed for  global imaging of the pelvis including uterus, ovaries, adnexal regions, and pelvic cul-de-sac. It was necessary to proceed with endovaginal exam following the transabdominal exam to visualize the ovaries.  COMPARISON:  None  FINDINGS: Uterus  Removed.  Right ovary  Not visualized.  Left ovary  Not visualized.  Other findings  No free fluid.  IMPRESSION: The ovaries could not be visualized on transvaginal or transabdominal pelvic ultrasound. However, review of the CT scan of the pelvis dated 05/17/2013 does demonstrate that both ovaries appear normal, measuring 23 mm on the right and 16 mm on the left.   Electronically Signed   By: Geanie Cooley M.D.   On: 06/21/2013 16:45   Ir Fluoro Guide Cv Line Right  06/20/2013   CLINICAL DATA:  Encephalitis and need for tunneled catheter for plasmapheresis.  EXAM: TUNNELED CENTRAL VENOUS CATHETER PLACEMENT WITH ULTRASOUND AND FLUOROSCOPIC GUIDANCE  MEDICATIONS: 1 g IV vancomycin. Vancomycin was given within two hours of incision. Vancomycin was given due to an antibiotic allergy.  ANESTHESIA/SEDATION: 2.0 mg IV Versed; 100 mcg IV Fentanyl.  Total Moderate Sedation Time  Twenty minutes.  FLUOROSCOPY TIME:  42 seconds.  PROCEDURE: The procedure, risks, benefits, and alternatives were explained to the patient. Questions regarding the procedure were encouraged and answered. The patient understands and  consents to the procedure.  The right neck and chest were prepped with chlorhexidine in a sterile fashion, and a sterile drape was applied covering the operative field. Maximum barrier sterile technique with sterile gowns and gloves were used for the procedure. Local anesthesia was provided with 1% lidocaine.  Ultrasound was used to confirm patency of the right internal jugular vein. After creating a small venotomy incision, a 21 gauge needle was advanced into the right internal jugular vein under direct, real-time ultrasound guidance. Ultrasound image documentation was performed.  After securing guidewire access, an 8 Fr dilator was placed. A J-wire was kinked to measure appropriate catheter length.  A Bard Equistream tunneled hemodialysis/pheresis catheter measuring 19 cm from tip to cuff was chosen for placement. This was tunneled in a retrograde fashion from the chest wall to the venotomy incision.  At the venotomy, serial dilatation was performed and a 16 Fr peel-away sheath was placed over a guidewire. The catheter was then placed through the sheath and the sheath removed. Final catheter positioning was confirmed and documented with a fluoroscopic spot image. The catheter was aspirated, flushed with saline, and injected with appropriate volume heparin dwells.  The venotomy incision was closed with subcutaneous 3-0 Monocryl and subcuticular 4-0 Vicryl. Dermabond was applied to the incision. The catheter exit site was secured with 0-Prolene retention sutures.  COMPLICATIONS: None.  No pneumothorax.  FINDINGS: After catheter placement, the tips lie in the right atrium. The catheter aspirates normally and is ready for immediate use.  IMPRESSION: Placement of tunneled pheresis catheter via the right internal jugular vein. The catheter tips lie in the right atrium. The catheter is ready for immediate use.   Electronically Signed   By: Irish Lack M.D.   On: 06/20/2013 14:27   Ir Removal Tun Cv Cath W/o Fl  07/11/2013   CLINICAL DATA:  Sepsis, concern for right internal jugular tunneled central catheter infection, finished plasmapheresis treatment, request for removal.  EXAM: REMOVAL OF TUNNELED CENTRAL VENOUS CATHETER  PROCEDURE: Risks and benefits of procedure discussed with the patient's son over the phone, verbal consent was obtained.  The right chest tunneled central catheter site was prepped with chlorhexidine. A sterile gown and gloves were worn during the procedure.  Utilizing manual manipulation, the subcutaneous cuff of the central catheter was freed. The catheter was then  successfully removed in its entirety. A sterile dressing was applied over the catheter exit site.  IMPRESSION: Removal of tunneled right internal jugular central catheter utilizing manual manipulation. The procedure was uncomplicated.  Read By:  Pattricia Boss PA-C   Electronically Signed   By: Malachy Moan M.D.   On: 07/11/2013 11:39   Ir Replc Duoden/jejuno Tube Percut W/fluoro  07/13/2013   CLINICAL DATA:  Occluded surgical jejunostomy requiring exchange.  EXAM: JEJUNOSTOMY TUBE EXCHANGE  CONTRAST:  10 ml Omnipaque-300  FLUOROSCOPY TIME:  1 min and 24 seconds.  PROCEDURE: Informed consent was obtained by telephone from the patient's son.  The pre-existing jejunostomy and surrounding skin were prepped with Betadine. Attempt was made to inject contrast via the pre-existing tube. The tube was cut and removed over a hydrophilic guidewire. A new 12 French red rubber jejunostomy catheter was advanced over the wire.  Final catheter position was confirmed with a fluoroscopic spot image obtained after injection of contrast. The catheter was secured at the exit site with a silk retention suture.  COMPLICATIONS: None.  FINDINGS: The pre-existing 4 French tube is occluded. A new 12 French tube was placed  and advanced into the jejunum.  IMPRESSION: Replacement of occluded 10 French jejunostomy catheter. The tube was up sized to a 12 [place] catheter advanced into the jejunum over a guidewire.   Electronically Signed   By: Irish Lack M.D.   On: 07/13/2013 15:54   Ir US Guide Vasc Access Right  06/20/2013   CLINICAL DATA:  Encephalitis and need for tunneled catheter for plasmapheresis.  EXAM: TUNNELED CENTRAL VENOUS CATHETER PLACEMENT WITH ULTRASOUND AND FLUOROSCOPIC GUIDANCE  MEDICATIONS: 1 g IV vancomycin. Vancomycin was given within two hours of incision. Vancomycin was given due to an antibiotic allergy.  ANESTHESIA/SEDATION: 2.0 mg IV Versed; 100 mcg IV Fentanyl.  Total Moderate Sedation Time  Twenty  minutes.  FLUOROSCOPY TIME:  42 seconds.  PROCEDURE: The procedure, risks, benefits, and alternatives were explained to the patient. Questions regarding the procedure were encouraged and answered. The patient understands and consents to the procedure.  The right neck and chest were prepped with chlorhexidine in a sterile fashion, and a sterile drape was applied covering the operative field. Maximum barrier sterile technique with sterile gowns and gloves were used for the procedure. Local anesthesia was provided with 1% lidocaine.  Ultrasound was used to confirm patency of the right internal jugular vein. After creating a small venotomy incision, a 21 gauge needle was advanced into the right internal jugular vein under direct, real-time ultrasound guidance. Ultrasound image documentation was performed. After securing guidewire access, an 8 Fr dilator was placed. A J-wire was kinked to measure appropriate catheter length.  A Bard Equistream tunneled hemodialysis/pheresis catheter measuring 19 cm from tip to cuff was chosen for placement. This was tunneled in a retrograde fashion from the chest wall to the venotomy incision.  At the venotomy, serial dilatation was performed and a 16 Fr peel-away sheath was placed over a guidewire. The catheter was then placed through the sheath and the sheath removed. Final catheter positioning was confirmed and documented with a fluoroscopic spot image. The catheter was aspirated, flushed with saline, and injected with appropriate volume heparin dwells.  The venotomy incision was closed with subcutaneous 3-0 Monocryl and subcuticular 4-0 Vicryl. Dermabond was applied to the incision. The catheter exit site was secured with 0-Prolene retention sutures.  COMPLICATIONS: None.  No pneumothorax.  FINDINGS: After catheter placement, the tips lie in the right atrium. The catheter aspirates normally and is ready for immediate use.  IMPRESSION: Placement of tunneled pheresis catheter via the  right internal jugular vein. The catheter tips lie in the right atrium. The catheter is ready for immediate use.   Electronically Signed   By: Irish Lack M.D.   On: 06/20/2013 14:27   Dg Chest Port 1 View  07/20/2013   CLINICAL DATA:  Possible aspiration.  EXAM: PORTABLE CHEST - 1 VIEW  COMPARISON:  Chest radiograph and CTA of the chest performed 07/17/2013  FINDINGS: The patient's right PICC is noted ending about the distal SVC.  The previously noted bilateral pleural effusions have improved. Bibasilar airspace opacity likely reflects residual atelectasis, without definite evidence of aspiration. No pneumothorax is seen.  The cardiomediastinal silhouette is borderline normal in size. No acute osseous abnormalities are identified.  IMPRESSION: Previously noted bilateral pleural effusions have improved. Bibasilar airspace opacity likely reflects residual atelectasis, without definite evidence of aspiration.   Electronically Signed   By: Roanna Raider M.D.   On: 07/20/2013 03:35   Dg Chest Portable 1 View  07/17/2013   CLINICAL DATA:  Abdominal pain, tachycardia.  EXAM:  PORTABLE CHEST - 1 VIEW  COMPARISON:  CT ANGIO CHEST W/CM &/OR WO/CM dated 07/11/2013; DG CHEST 1V PORT dated 07/11/2013  FINDINGS: Moderate right pleural effusion, likely slightly increased since prior study. Interval removal of right hemodialysis catheter. Right PICC line is unchanged. Patchy right lung airspace disease and left basilar airspace disease medially, both slightly increased since prior study. No acute bony abnormality.  IMPRESSION: Increasing patchy bilateral airspace disease as above. Increasing moderate right effusion.   Electronically Signed   By: Charlett Nose M.D.   On: 07/17/2013 03:54   Dg Chest Port 1 View  07/11/2013   CLINICAL DATA:  Shortness of breath, evaluate pulmonary infiltrates  EXAM: PORTABLE CHEST - 1 VIEW  COMPARISON:  DG CHEST 1V PORT dated 07/10/2013; DG CHEST 1V PORT dated 07/03/2013; CT CHEST W/CM dated  06/21/2013; IR FLUORO GUIDE CV LINE*R* dated 06/20/2013; DG CHEST 1V PORT dated 05/11/2013  FINDINGS: Grossly unchanged cardiac silhouette and mediastinal contours given patient rotation. Stable position of support apparatus. Grossly unchanged right mid lung heterogeneous airspace opacities with worsening bilateral medial basilar airspace opacities. No definite pleural effusion or pneumothorax. No evidence of edema. A minimal amount of enteric contrast is seen within in the gastric fundus. Post cholecystectomy. Grossly unchanged bones.  IMPRESSION: 1.  Stable positioning of support apparatus.  No pneumothorax. 2. Worsening bibasilar airspace disease, possibly atelectasis though progression of multifocal infection / aspiration is not excluded. A follow-up chest radiograph in 4 to 6 weeks after treatment is recommended to ensure resolution.   Electronically Signed   By: Simonne Come M.D.   On: 07/11/2013 07:47   Dg Chest Port 1 View  07/10/2013   CLINICAL DATA:  Shortness of breath.  Hypotension  EXAM: PORTABLE CHEST - 1 VIEW  COMPARISON:  DG CHEST 1V PORT dated 07/03/2013; CT CHEST W/CM dated 06/21/2013  FINDINGS: Right internal jugular dialysis catheter tip: Right atrium. Thoracic spondylosis noted. There is a band of airspace opacity in the right upper lobe just above the minor fissure. Improved aeration at the right lung base.  IMPRESSION: Improved aeration at the right lung base, with but there is a band of airspace opacity in the right upper lobe favoring atelectasis over pneumonia. Next item thoracic spondylosis.   Electronically Signed   By: Herbie Baltimore M.D.   On: 07/10/2013 08:27   Dg Chest Port 1 View  07/03/2013   CLINICAL DATA:  Tubes and lines in good position, detailed above.  EXAM: PORTABLE CHEST - 1 VIEW  COMPARISON:  Chest CT 06/21/2013  FINDINGS: There is nasogastric tube which terminates near the pylorus. Right IJ catheter, tip in the upper right atrium. Right upper extremity PICC, tip at the  upper cavoatrial junction.  Oral contrast noted within the gastric fundus and small bowel.  Haziness of the right base, with volume loss. Node definitive consolidation.  No cardiomegaly.  IMPRESSION: 1. Negative for pneumonia. 2. Haziness of the right lower chest, likely pleural fluid or atelectasis. 3. Tubes and lines in good position, detailed above.   Electronically Signed   By: Tiburcio Pea M.D.   On: 07/03/2013 03:35   Dg Abd 2 Views  07/19/2013   CLINICAL DATA:  Abdominal pain and vomiting, recent bowel resection.  EXAM: ABDOMEN - 2 VIEW  COMPARISON:  DG ABD PORTABLE 1V dated 07/17/2013  FINDINGS: Bowel gas pattern appears nondilated and nonobstructive, with an overall paucity of bowel gas. A few scattered air-fluid levels seen on the lateral decubitus views. No intraperitoneal  free air.  Jejunostomy tube projects and left lower quadrant, no residual enteric contrast. Surgical staple line projects in left mid abdomen. Surgical clips in the right upper quadrant likely reflect cholecystectomy. Soft tissue planes and included osseous structures are nonsuspicious.  IMPRESSION: A few scattered air-fluid levels may reflect mild ileus, less likely or early bowel obstruction.  Jejunostomy tube in place without residual enteric contrast.   Electronically Signed   By: Awilda Metro   On: 07/19/2013 22:27   Dg Abd Portable 1v  07/17/2013   CLINICAL DATA:  J-tube placement.  EXAM: PORTABLE ABDOMEN - 1 VIEW  COMPARISON:  07/13/2013  FINDINGS: Contrast was injected through the jejunostomy tube. This opacifies small bowel loops in the upper pelvis. No evidence of contrast extravasation.  Slightly prominent bowel loops or superiorly in the abdomen, nonspecific. Prior cholecystectomy. No free air.  IMPRESSION: Jejunostomy tube within upper pelvic small bowel loops. Mildly prominent abdominal small bowel loops which could reflect mild focal ileus although early or partial small bowel obstruction cannot be excluded.    Electronically Signed   By: Charlett Nose M.D.   On: 07/17/2013 03:57   Dg Abd Portable 1v  07/12/2013   CLINICAL DATA:  Abdominal pain  EXAM: PORTABLE ABDOMEN - 1 VIEW  COMPARISON:  07/11/2013  FINDINGS: Postsurgical changes are again seen. A feeding catheter is noted. Scattered large and small bowel gas is seen. No obstructive changes are noted. No definitive free air is seen.  IMPRESSION: No significant change from the prior exam.   Electronically Signed   By: Alcide Clever M.D.   On: 07/12/2013 07:44   Dg Abd Portable 1v  07/11/2013   CLINICAL DATA:  Abdominal distention, evaluate for small bowel obstruction  EXAM: PORTABLE ABDOMEN - 1 VIEW  COMPARISON:  CT ABD/PELV WO CM dated 07/10/2013; DG ABD PORTABLE 1V dated 07/10/2013; DG ABD PORTABLE 1V dated 06/30/2013  FINDINGS: There has been passage of the majority of previously administered enteric contrast. A minimal amount of enteric contrast remains within the colon. Paucity of bowel gas without definite evidence of obstruction. Nondiagnostic evaluation pneumoperitoneum secondary supine positioning exclusion of lower thorax. No definite pneumatosis or portal venous gas.  A feeding jejunostomy tube overlies the left mid/ lower abdomen.  A vertically aligned skin staples overlie the right mid abdomen. Post cholecystectomy.  Unchanged bones.  IMPRESSION: Paucity of bowel gas, however there has been apparent passage of previously ingested enteric contrast. No definite evidence of obstruction.   Electronically Signed   By: Simonne Come M.D.   On: 07/11/2013 08:26   Dg Abd Portable 1v  07/10/2013   CLINICAL DATA:  Assess for ileus  EXAM: PORTABLE ABDOMEN - 1 VIEW  COMPARISON:  June 30, 2013  FINDINGS: There is persistent air-filled dilated small bowel loops in the left abdomen. Air is noted in the colon. Skin staples are projected over the right abdomen. Patient is status post prior cholecystectomy.  IMPRESSION: Persistent ileus.   Electronically Signed   By:  Sherian Rein M.D.   On: 07/10/2013 02:30   Dg Abd Portable 1v  06/30/2013   CLINICAL DATA:  NG tube placement.  EXAM: PORTABLE ABDOMEN - 1 VIEW  COMPARISON:  Single view of the abdomen 06/27/2013. Upper GI/small bowel follow-through 06/28/2013  FINDINGS: NG tube is in place in good position with the tip in this distal stomach. Contrast material in small bowel from the comparison upper GI series/small bowel follow-through. Small bowel loops are dilated. Only a small  amount of loculated barium is seen in the colon.  IMPRESSION: NG tube in good position.  Small bowel obstruction.   Electronically Signed   By: Drusilla Kanner M.D.   On: 06/30/2013 19:01   Dg Ugi W/small Bowel  06/28/2013   CLINICAL DATA:  53 year old female postop 2 days since percutaneous jejunostomy tube placement. Drainage from NG tube. Abdominal pain. Initial encounter.  EXAM: UPPER GI SERIES WITH SMALL BOWEL FOLLOW-THROUGH  FLUOROSCOPIC GUIDED NG TUBE PLACEMENT  TECHNIQUE: Combined double contrast and single contrast upper GI series using effervescent crystals, thick barium, and thin barium. Subsequently, serial images of the small bowel were obtained including spot views of the terminal ileum.  COMPARISON:  KUB 06/27/2013 and earlier.  FLUOROSCOPY TIME:  9 min and 18 seconds.  FINDINGS: Preprocedural scout view of the abdomen demonstrates a small volume of loculated barium in the colon. This is related to the 06/17/2013 NG tube placement/ confirmation study.  Scout view demonstrates the recently placed right an mid lower abdomen percutaneous jejunostomy. Right lower abdomen skin staples in place.  The scout view also demonstrates the patient's NG tube is looped back up into the esophagus. This was evaluated in real-time with fluoroscopy, and a wire was placed through the tube in an effort to maneuver the tip back into the stomach. However, this was unsuccessful an the tube had be retracted into the nasopharynx. Using fluoroscopic guidance,  the tube was then advanced with a wire into the stomach, such that the tip and side hole are within the stomach.  Given the recent abdominal surgery, initially water-soluble contrast was planned, however prominent gastroesophageal reflux of contrast was noted early on, and the decision was made to switch contrast to thin barium. Water-soluble contrast was fully aspirated from the stomach. Subsequently, barium contrast was slowly administered.  The stomach appears small. No gastrojejunostomy is evident. After a short delay, barium emptied the stomach into the proximal duodenum. The second portion of the duodenum is dilated.  After 20 additional min significantly dilated proximal jejunum is opacified, with the small bowel caliber up to 58 mm.  At this point study was discussed with Dr. Jimmye Norman . We decided to inject the percutaneous jejunostomy tube, and give the barium more time to opacified a small bowel in hopes of loose to dating the small bowel obstruction transition point.  Barium injection through the jejunostomy tube demonstrates normal small bowel loops at the jejunostomy site. This barium was then aspirated.  Subsequently a 3 hr delayed image was obtained demonstrating a gradual tapering of small bowel loops into the right lower abdomen, heading toward the vicinity of the jejunostomy. These smaller loops measure 23 mm diameter (arrow).  At this point, Dr. Lindie Spruce advised that no additional imaging was needed.  IMPRESSION: 1. High-grade small bowel obstruction. Dilated duodenum and proximal jejunum up to 58 mm diameter. 2. Small bowel loops slowly taper to the right lower abdomen in the vicinity of the percutaneous jejunostomy. 3. The jejunostomy was injected with contrast, and is situated within normal decompressed small bowel. 4. Diminutive stomach. No gastrojejunostomy is evident. Prominent gastroesophageal reflux. 5. At the beginning of the exam the patient's NG tube was malpositioned. This was  withdrawn to the nasopharynx and repositioned into the stomach under fluoroscopic guidance.   Electronically Signed   By: Augusto Gamble M.D.   On: 06/28/2013 16:29         Subjective: Patient had 2 episodes of emesis yesterday and another episode last night. No  further emesis this morning. No residuals noted on tube feeds. The patient denies any chest discomfort, shortness breath, abdominal pain, dysuria, hematuria. Denies any headache or dizziness.  Objective: Filed Vitals:   07/19/13 1203 07/19/13 1506 07/19/13 2244 07/20/13 0521  BP: 120/98 115/111 130/84 124/88  Pulse: 96 98 117   Temp:  98.7 F (37.1 C) 98.4 F (36.9 C) 98.2 F (36.8 C)  TempSrc:  Axillary Oral Oral  Resp:  22 20 20   Weight:    79.062 kg (174 lb 4.8 oz)  SpO2:  98% 100% 100%    Intake/Output Summary (Last 24 hours) at 07/20/13 1155 Last data filed at 07/20/13 0827  Gross per 24 hour  Intake   1567 ml  Output      0 ml  Net   1567 ml   Weight change: -1.769 kg (-3 lb 14.4 oz) Exam:   General:  Pt is alert, follows commands appropriately, not in acute distress  HEENT: No icterus, No thrush,[place]/AT  Cardiovascular: RRR, S1/S2, no rubs, no gallops  Respiratory: CTA bilaterally, no wheezing, no crackles, no rhonchi  Abdomen: Soft/+BS, non tender, non distended, no guarding  Extremities: 1+ edema, No lymphangitis, No petechiae, No rashes, no synovitis  Data Reviewed: Basic Metabolic Panel:  Recent Labs Lab 07/14/13 0415 07/15/13 0500  07/16/13 0550 07/17/13 0335 07/18/13 0615 07/19/13 0455 07/20/13 0510  NA 146 143  --   --  144 142 143 141  K 3.1* 2.5*  < > 3.8 3.7 3.7 4.4 4.5  CL 112 109  --   --  108 106 107 105  CO2 26 24  --   --  24 26 25 28   GLUCOSE 81 101*  --   --  85 86 93 85  BUN 3* <3*  --   --  4* 3* 3* 5*  CREATININE 0.59 0.55  --   --  0.55 0.54 0.53 0.53  CALCIUM 7.9* 7.7*  --   --  8.0* 7.9* 8.0* 8.1*  MG 2.0  --   --  1.9  --   --   --   --   < > = values in this  interval not displayed. Liver Function Tests:  Recent Labs Lab 07/14/13 0415 07/15/13 0500 07/17/13 0335 07/20/13 0510  AST 16 15 21 22   ALT 9 9 10 10   ALKPHOS 49 55 68 78  BILITOT 0.3 0.3 0.5 0.3  PROT 4.0* 4.3* 4.9* 4.8*  ALBUMIN 1.8* 1.9* 2.3* 2.2*   No results found for this basename: LIPASE, AMYLASE,  in the last 168 hours  Recent Labs Lab 07/17/13 1320  AMMONIA 17   CBC:  Recent Labs Lab 07/14/13 0415 07/17/13 0335 07/18/13 0615 07/19/13 0455 07/20/13 0510  WBC 4.7 8.9 7.7 6.1 6.8  NEUTROABS  --  6.3  --   --   --   HGB 10.3* 11.5* 10.8* 10.2* 10.0*  HCT 30.2* 34.1* 31.9* 31.2* 30.6*  MCV 91.2 94.7 94.9 96.6 95.9  PLT 144* 130* 143* 137* 136*   Cardiac Enzymes: No results found for this basename: CKTOTAL, CKMB, CKMBINDEX, TROPONINI,  in the last 168 hours BNP: No components found with this basename: POCBNP,  CBG:  Recent Labs Lab 07/19/13 2111 07/19/13 2356 07/20/13 0540 07/20/13 0907 07/20/13 1129  GLUCAP 87 93 78 98 85    Recent Results (from the past 240 hour(s))  MRSA PCR SCREENING     Status: None   Collection Time    07/10/13  12:55 PM      Result Value Ref Range Status   MRSA by PCR NEGATIVE  NEGATIVE Final   Comment:            The GeneXpert MRSA Assay (FDA     approved for NASAL specimens     only), is one component of a     comprehensive MRSA colonization     surveillance program. It is not     intended to diagnose MRSA     infection nor to guide or     monitor treatment for     MRSA infections.  CULTURE, BLOOD (ROUTINE X 2)     Status: None   Collection Time    07/12/13 10:40 AM      Result Value Ref Range Status   Specimen Description BLOOD LEFT FOREARM   Final   Special Requests BOTTLES DRAWN AEROBIC AND ANAEROBIC 10CC   Final   Culture  Setup Time     Final   Value: 07/12/2013 14:02     Performed at [facility]   Culture     Final   Value: NO GROWTH 5 DAYS     Performed at [facility]   Report  Status 07/18/2013 FINAL   Final  CULTURE, BLOOD (ROUTINE X 2)     Status: None   Collection Time    07/12/13 10:50 AM      Result Value Ref Range Status   Specimen Description BLOOD LEFT ARM   Final   Special Requests BOTTLES DRAWN AEROBIC AND ANAEROBIC 10CC   Final   Culture  Setup Time     Final   Value: 07/12/2013 14:02     Performed at [facility]   Culture     Final   Value: NO GROWTH 5 DAYS     Performed at [facility]   Report Status 07/18/2013 FINAL   Final  CULTURE, BLOOD (ROUTINE X 2)     Status: None   Collection Time    07/17/13  3:35 AM      Result Value Ref Range Status   Specimen Description BLOOD RIGHT PICC LINE   Final   Special Requests BOTTLES DRAWN AEROBIC AND ANAEROBIC 10CC   Final   Culture  Setup Time     Final   Value: 07/17/2013 08:11     Performed at [facility]   Culture     Final   Value:        BLOOD CULTURE RECEIVED NO GROWTH TO DATE CULTURE WILL BE HELD FOR 5 DAYS BEFORE ISSUING A FINAL NEGATIVE REPORT     Performed at [facility]   Report Status PENDING   Incomplete  CULTURE, BLOOD (ROUTINE X 2)     Status: None   Collection Time    07/17/13  3:52 AM      Result Value Ref Range Status   Specimen Description BLOOD LEFT ARM   Final   Special Requests BOTTLES DRAWN AEROBIC ONLY 10CC   Final   Culture  Setup Time     Final   Value: 07/17/2013 08:11     Performed at [facility]   Culture     Final   Value:        BLOOD CULTURE RECEIVED NO GROWTH TO DATE CULTURE WILL BE HELD FOR 5 DAYS BEFORE ISSUING A FINAL NEGATIVE REPORT     Performed at [facility]   Report Status PENDING  Incomplete  URINE CULTURE     Status: None   Collection Time    07/17/13  4:27 AM      Result Value Ref Range Status   Specimen Description URINE, CATHETERIZED   Final   Special Requests Normal   Final   Culture  Setup Time     Final   Value: 07/16/2013 03:22     Performed at [facility] Count      Final   Value: NO GROWTH     Performed at [facility]   Culture     Final   Value: NO GROWTH     Performed at [facility]   Report Status 07/18/2013 FINAL   Final  CLOSTRIDIUM DIFFICILE BY PCR     Status: None   Collection Time    07/17/13 10:30 AM      Result Value Ref Range Status   C difficile by pcr NEGATIVE  NEGATIVE Final     Scheduled Meds: . chlorhexidine  15 mL Mouth/Throat BID  . ciprofloxacin  400 mg Intravenous Q12H  . enoxaparin (LOVENOX) injection  40 mg Subcutaneous Q24H  . ferrous sulfate  325 mg Oral BID WC  . folic acid  1 mg Oral Daily  . free water  200 mL Per Tube 3 times per day  . geriatric multivitamins-minerals  15 mL Oral Daily  . levETIRAcetam  1,000 mg Oral BID  . metoCLOPramide (REGLAN) injection  5 mg Intravenous 3 times per day  . metoprolol tartrate  50 mg Oral BID  . mirtazapine  30 mg Oral QHS  . pantoprazole sodium  40 mg Per Tube Q1200  . risperiDONE  0.5 mg Oral BID  . sodium chloride  10-40 mL Intracatheter Q12H  . thiamine  100 mg Oral Daily  . valproic acid  500 mg Oral TID   Continuous Infusions: . feeding supplement (VITAL AF 1.2 CAL) 1,000 mL (07/19/13 2031)     Altheria Shadoan, DO  [facility] Pager [phone]  If 7PM-7AM, please contact night-coverage [email or web address] Password [facility] 07/20/2013, 11:55 AM   LOS: 3 days

## 2013-07-20 NOTE — Clinical Social Work Note (Signed)
DC was postponed yesterday due to vomiting episode.  CSW called to inquire re: possible w/[facility] dc [facility].  RN to update CSW once MD rounds.    Vickii PennaGina Gerald [place], [place] [phone]

## 2013-07-20 NOTE — Progress Notes (Signed)
Pt. Vomited a small amount while taking her morning medication. MD notified of vomiting episode. Pt. Did not show signs of distress following the vomiting episode.

## 2013-07-20 NOTE — Progress Notes (Signed)
I was notified of pt. Being ST >140, Nurse Tech was giving pt. Bath and advised that when rolling pt., pt. Became scared thinking she was going to fall and that's why her heart rate increased. Once Tech finished rolling pt. Back on her back her HR immediately decreased below 130 and continued to decline. Tube feeding was paused while Tech bathed pt and restarted when she finished. Pt. Showed no signs or symptoms of distress.

## 2013-07-20 NOTE — Progress Notes (Signed)
1st cup of Contrast was not finished being given to pt. Until end of my shift. I reported to oncoming nurse of 2nd cup of contrast that needed to be given thru pt.'s feeding tube and to notify MD on call and CT when it was completed so Tube feeding can be restarted and so CT can complete CT scan. Oncoming nurse verified understanding.

## 2013-07-21 DIAGNOSIS — R1110 Vomiting, unspecified: Secondary | ICD-10-CM

## 2013-07-21 LAB — GLUCOSE, CAPILLARY
GLUCOSE-CAPILLARY: 76 mg/dL (ref 70–99)
GLUCOSE-CAPILLARY: 86 mg/dL (ref 70–99)
Glucose-Capillary: 87 mg/dL (ref 70–99)
Glucose-Capillary: 82 mg/dL (ref 70–99)
Glucose-Capillary: 96 mg/dL (ref 70–99)
Glucose-Capillary: 76 mg/dL (ref 70–99)

## 2013-07-21 LAB — BASIC METABOLIC PANEL
BUN: 6 mg/dL (ref 6–23)
CALCIUM: 7.7 mg/dL — AB (ref 8.4–10.5)
CO2: 27 meq/L (ref 19–32)
CREATININE: 0.61 mg/dL (ref 0.50–1.10)
Chloride: 104 mEq/L (ref 96–112)
GFR calc Af Amer: >90 mL/min (ref >90)
GFR calc non Af Amer: >90 mL/min (ref >90)
GLUCOSE: 85 mg/dL (ref 70–99)
Potassium: 3.9 mEq/L (ref 3.7–5.3)
Sodium: 138 mEq/L (ref 137–147)

## 2013-07-21 LAB — CBC
HEMATOCRIT: 29.4 % — AB (ref 36.0–46.0)
Hemoglobin: 9.8 g/dL — ABNORMAL LOW (ref 12.0–15.0)
MCH: 32.1 pg (ref 26.0–34.0)
MCHC: 33.3 g/dL (ref 30.0–36.0)
MCV: 96.4 fL (ref 78.0–100.0)
Platelets: 146 10*3/uL — ABNORMAL LOW (ref 150–400)
RBC: 3.05 MIL/uL — ABNORMAL LOW (ref 3.87–5.11)
RDW: 19.5 % — AB (ref 11.5–15.5)
WBC: 6.4 10*3/uL (ref 4.0–10.5)

## 2013-07-21 LAB — URINE CULTURE
Colony Count: NO GROWTH
Culture: NO GROWTH

## 2013-07-21 LAB — HEPATIC FUNCTION PANEL
ALT: 11 U/L (ref 0–35)
AST: 22 U/L (ref 0–37)
Albumin: 2 g/dL — ABNORMAL LOW (ref 3.5–5.2)
Alkaline Phosphatase: 76 U/L (ref 39–117)
Bilirubin, Direct: <0.2 mg/dL (ref 0.0–0.3)
Total Bilirubin: 0.3 mg/dL (ref 0.3–1.2)
Total Protein: 4.5 g/dL — ABNORMAL LOW (ref 6.0–8.3)

## 2013-07-21 MED ORDER — METOPROLOL TARTRATE 50 MG PO TABS
75.0000 mg | ORAL_TABLET | Freq: Two times a day (BID) | ORAL | Status: DC
  Administered 2013-07-21: 75 mg via ORAL
  Filled 2013-07-21 (×2): qty 1

## 2013-07-21 MED ORDER — METOPROLOL TARTRATE 1 MG/ML IV SOLN
2.5000 mg | Freq: Four times a day (QID) | INTRAVENOUS | Status: DC
  Administered 2013-07-21 – 2013-07-22 (×3): 2.5 mg via INTRAVENOUS
  Filled 2013-07-21 (×6): qty 5

## 2013-07-21 MED ORDER — VITAL AF 1.2 CAL PO LIQD
1000.0000 mL | ORAL | Status: DC
  Administered 2013-07-21: 1000 mL
  Filled 2013-07-21 (×2): qty 1000

## 2013-07-21 MED ORDER — ALBUMIN HUMAN 5 % IV SOLN
25.0000 g | Freq: Once | INTRAVENOUS | Status: AC
  Administered 2013-07-21: 25 g via INTRAVENOUS
  Filled 2013-07-21: qty 500

## 2013-07-21 MED ORDER — SODIUM CHLORIDE 0.9 % IV SOLN
INTRAVENOUS | Status: DC
  Administered 2013-07-21: 11:00:00 via INTRAVENOUS

## 2013-07-21 MED ORDER — SODIUM CHLORIDE 0.9 % IV SOLN
1000.0000 mg | Freq: Two times a day (BID) | INTRAVENOUS | Status: DC
  Administered 2013-07-21 – 2013-07-22 (×2): 1000 mg via INTRAVENOUS
  Filled 2013-07-21 (×3): qty 10

## 2013-07-21 MED ORDER — VALPROATE SODIUM 500 MG/5ML IV SOLN
500.0000 mg | Freq: Three times a day (TID) | INTRAVENOUS | Status: DC
  Administered 2013-07-21 – 2013-07-22 (×3): 500 mg via INTRAVENOUS
  Filled 2013-07-21 (×5): qty 5

## 2013-07-21 NOTE — Progress Notes (Signed)
[facility] PROGRESS NOTE  Joan [name] ZOX:[id number] DOB: [birth year] DOA: 07/17/2013 PCP: [name], MD  Assessment/Plan: Sinus tachycardia/SIRS  -Tachycardia improving with the restart of atenolol-->the patient was switched to metoprolol tartrate  -increase metoprolol tartrate to 75mg  bid -According to the medical record this has been a chronic problem for the last several weeks which showed some mild improvement after beta blocker was started on the last admission  -This is likely due to her acute medical illness  -Right upper extremity duplex negative for DVT  -appreciate Dr. Andrey Campanile, general surgery assistance  -abdominal binder at all times  -restart enteral feeds at 30cc/hr and incr by 10cc/hr every 4 hours -Continue ciprofloxacin IV--plan to continue until 07/26/2013 if repeat blood cultures are negative--this will signify 14 days of antibiotic therapy from the last negative culture  -Blood cultures--negative  -duplex RUE to r/o DVT  -CT angiogram chest is negative for pulmonary embolus  -Urinalysis negative for pyuria  Acute encephalopathy  -Unclear what the patient's baseline is; however after speaking to the patient's son the patient is near her baseline. The patient's mentation significantly improved from the time of admission.  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 Pt received diatech catheter on 06/20/2013 placement and received and finished plasmapheresis  -speech to eval swallowing--dysphagia 1 diet with thin liquids  -Check serum B12--667  - RBC folate--693  - RPR--NR  -ammonia--17  -Updated information revealed that the patient did not receive any medications during her short stay [name]--question seizure with postictal state as reason for patient's acute encephalopathy  -The patient has outpatient neurology appointment on 08/23/2013 which she should keep  Recurrent vomiting  -ABD  xray shows few scatter fluid levels--?ileus  -pt has had BMs  -CT abdomen--negative for bowel obstruction. Chronic proximal jejunal dilatation. Small bilateral pleural effusions  -continue reglan  -check VPA level--28.5 - lipase--15 -Restart enteral feeds at 30 cc per hour and increase by 10 cc per hour every 4 hours to goal rate of 60  -give 500cc albumin, start NS IV Hx of Convulsions/Seizures  -Pt has a PMH of seizures and catatonia.  - 06/20/13 MRI of the brain showed resolution of thalamic signal abnormality  -continue current AEDs  Hx of Anxiety  -Continue pt on Remeron  -Patient appears to have had a psychiatric history  -Appears to be stable at this time  -Psych prescribed Risperdal 0.5 mg bid previous admission  HTN  -Increase metoprolol to 75 mg twice a day Malnutrition with history of Gastric Bypass  - encourage PO intake with TF  -J-tube resutured and stable  -Gen Surg consulted, J-tube placed and underwent revision of j tube with exploratory laparotomy on 2/3 and small bowel resection.   -She is able to take a dysphagia 1 diet by mouth which I encouraged the patient to do  Bacteremia  -On her previous admission, the patient had Klebsiella pneumoniae bacteremia for which she was discharged on ciprofloxacin  -She was continued on ciprofloxacin throughout her hospitalization  -Continue ciprofloxacin through 07/26/2013  -Repeat blood cultures have remained negative.  Thrombocytopenia  -No acute bleeding monitor  -chronic  Pleural effusions  -Currently no respiratory distress  -Oxygen saturation 97-100% on room air  -Remains stable when compared to prior CT angiogram of the chest on 07/11/2013  -repeat CXR 07/19/13 shows improvement of effusions  Loose Stools  -Patient had large volume yellow loose stool in the ED  -C. difficile PCR negative  Family Communication: Pt at beside  Disposition Plan: SNF 07/22/13 if stable        Procedures/Studies: Ct Abdomen Pelvis  Wo Contrast  07/10/2013   CLINICAL DATA:  Sepsis question intra-abdominal source  EXAM: CT ABDOMEN AND PELVIS WITHOUT CONTRAST  TECHNIQUE: Multidetector CT imaging of the abdomen and pelvis was performed following the standard protocol without intravenous contrast. Sagittal and coronal MPR images reconstructed from axial data set. GI contrast administered  COMPARISON:  CT pelvis 05/17/2013; CT abdomen and pelvis 06/02/2011  FINDINGS: Small moderate-sized right pleural effusion with basilar atelectasis.  Minimal atelectasis left base.  Gallbladder surgically absent.  Prior gastric surgery.  Jejunostomy tube left mid abdomen, through which GI contrast was administered.  Dilated small bowel loops in the upper abdomen terminating at an anastomotic staple line in the left mid abdomen most compatible with small bowel obstruction ; the lack of dilatation of the remaining small bowel and colon makes ileus much less likely.  Small bowel measures up to 4.8 cm transverse mild bowel wall thickening of the dilated loops.  Distal small bowel is decompressed and unremarkable.  Small foci of gas in the left mid abdomen into the upper left pelvis appear to be within several small bowel loops which are nondistended and no definite free intraperitoneal air is identified.  Decompressed colon containing contrast and a rectal tube.  Unremarkable bladder and ovaries with surgical absence of uterus.  Small amount of free pelvic fluid.  Diffuse soft tissue edema/ anasarca.  No mass, adenopathy, or hernia.  Osseous structures unremarkable.  IMPRESSION: Dilated proximal small bowel loops proximal to the jejunostomy site most consistent with small bowel obstruction, associated with mild bowel wall thickening and duodenal dilatation.  Decompressed distal small bowel and colon.  Small amount of nonspecific free pelvic fluid.   Electronically Signed   By: Ulyses Southward M.D.   On: 07/10/2013 17:38   Dg Abd 1 View  06/27/2013   CLINICAL DATA:   Nausea, abdominal pain, vomiting  EXAM: ABDOMEN - 1 VIEW  COMPARISON:  None.  FINDINGS: There is a surgical drain present in the mid abdomen. There are surgical stated in the right paramedian abdominal wall. There is a small amount of contrast scattered throughout the colon. There is no bowel dilatation to suggest obstruction. There is no evidence of pneumoperitoneum, portal venous gas or pneumatosis. There are no pathologic calcifications along the expected course of the ureters.The osseous structures are unremarkable.  IMPRESSION: Nonobstructive bowel gas pattern.   Electronically Signed   By: Elige Ko   On: 06/27/2013 09:34   Ct Chest W Contrast  06/21/2013   CLINICAL DATA:  Altered mental status.  Evaluate for malignancy.  EXAM: CT CHEST WITH CONTRAST  TECHNIQUE: Multidetector CT imaging of the chest was performed during intravenous contrast administration.  CONTRAST:  75mL OMNIPAQUE IOHEXOL 300 MG/ML  SOLN  COMPARISON:  Chest x-ray dated 05/11/2013  FINDINGS: Heart size and pulmonary vascularity are normal. There is no hilar or mediastinal adenopathy. The lungs are clear. No mass lesions, infiltrates, or effusions. Soft tissues of the chest are normal. Thyroid gland is normal. No acute osseous abnormality. Osteophytes fuse the thoracic spine from T6 through T10.  IMPRESSION: No evidence of malignancy or other significant abnormality of the chest.   Electronically Signed   By: Geanie Cooley M.D.   On: 06/21/2013 17:13   Ct Angio Chest Pe W/cm &/or Wo Cm  07/17/2013   CLINICAL DATA:  Tachycardia.  Upper chest pain,  shortness of breath.  EXAM: CT ANGIOGRAPHY CHEST WITH CONTRAST  TECHNIQUE: Multidetector CT imaging of the chest was performed using the standard protocol during bolus administration of intravenous contrast. Multiplanar CT image reconstructions and MIPs were obtained to evaluate the vascular anatomy.  CONTRAST:  70mL OMNIPAQUE IOHEXOL 350 MG/ML SOLN  COMPARISON:  DG CHEST 1V PORT dated  07/17/2013; CT ANGIO CHEST W/CM &/OR WO/CM dated 07/11/2013  FINDINGS: No filling defects in the pulmonary arteries to suggest pulmonary emboli. Heart is normal size. Aorta is normal caliber. No dissection.  Moderate left pleural effusion and large right pleural effusion. These are stable since prior study. Compressive atelectasis in the lower lobes bilaterally. No mediastinal, hilar or axillary adenopathy. Chest wall soft tissues are unremarkable. Imaging into the upper abdomen shows no acute findings.  Review of the MIP images confirms the above findings.  IMPRESSION: No evidence of pulmonary embolus.  Continued bilateral effusions, right greater than left, unchanged. Compressive atelectasis in the lower lobes.  No change since prior study.   Electronically Signed   By: Charlett Nose M.D.   On: 07/17/2013 06:48   Ct Angio Chest Pe W/cm &/or Wo Cm  07/11/2013   CLINICAL DATA:  Altered mental status, tachycardia, chest pain and shortness breath  EXAM: CT ANGIOGRAPHY CHEST WITH CONTRAST  TECHNIQUE: Multidetector CT imaging of the chest was performed using the standard protocol during bolus administration of intravenous contrast. Multiplanar CT image reconstructions and MIPs were obtained to evaluate the vascular anatomy.  CONTRAST:  OMNIPAQUE IOHEXOL 350 MG/ML SOLN  COMPARISON:  DG CHEST 1V PORT dated 07/11/2013; CT ABD/PELV WO CM dated 07/10/2013; CT CHEST W/CM dated 06/21/2013  FINDINGS: There are no filling defects within the pulmonary arteries to suggest acute pulmonary embolism. Acute findings of the aorta or great vessels. No pericardial fluid.  There are bilateral pleural effusions, moderate to large on the right and small on the left. There is associated right lower lobe passive atelectasis.  Limited view of the upper abdomen is unremarkable. Limited view of the skeleton demonstrates degenerative spurring.  Review of the MIP images confirms the above findings.  IMPRESSION: 1. No evidence acute pulmonary  embolism. 2. Moderate to large right pleural effusion with associated passive atelectasis. 3. No pericardial fluid.   Electronically Signed   By: Genevive Bi M.D.   On: 07/11/2013 17:53   Mr Cervical Spine Wo Contrast  06/22/2013   CLINICAL DATA:  Right hemiparesis. Possible autoimmune encephalitis.  EXAM: MRI CERVICAL SPINE WITHOUT CONTRAST  TECHNIQUE: Multiplanar, multisequence MR imaging was performed. No intravenous contrast was administered.  COMPARISON:  No comparison cervical spine MR. brain MR 06/20/2013.  FINDINGS: On this motion grade examination, artifact extends through the cervical cord however, no definitive cervical cord signal abnormality is noted. Cervical medullary junction unremarkable. Intracranial structures as detailed on recent MR of the brain.  Visualized paravertebral structures unremarkable. Both vertebral arteries are patent.  C2-3:  Negative.  C3-4:  Minimal right foraminal narrowing.  C4-5:  Minimal right foraminal narrowing.  C5-6:  Negative.  C6-7:  Negative.  C7-T1:  Negative.  T1-2:  Negative.  T2-3: Minimal Schmorl's node deformity.  Minimal bulge.  T3-4: Minimal bulge.  T4-5: Minimal bulge.  IMPRESSION: No evidence of cervical disc herniation.  Minimal degenerative changes upper thoracic spine.  Please see above.   Electronically Signed   By: Bridgett Larsson M.D.   On: 06/22/2013 19:38   US Transvaginal Non-ob  06/21/2013   CLINICAL DATA:  Altered  mental status. Evaluation for possible paraneoplastic syndrome.  EXAM: TRANSABDOMINAL AND TRANSVAGINAL ULTRASOUND OF PELVIS  TECHNIQUE: Both transabdominal and transvaginal ultrasound examinations of the pelvis were performed. Transabdominal technique was performed for global imaging of the pelvis including uterus, ovaries, adnexal regions, and pelvic cul-de-sac. It was necessary to proceed with endovaginal exam following the transabdominal exam to visualize the ovaries.  COMPARISON:  None  FINDINGS: Uterus  Removed.  Right ovary   Not visualized.  Left ovary  Not visualized.  Other findings  No free fluid.  IMPRESSION: The ovaries could not be visualized on transvaginal or transabdominal pelvic ultrasound. However, review of the CT scan of the pelvis dated 05/17/2013 does demonstrate that both ovaries appear normal, measuring 23 mm on the right and 16 mm on the left.   Electronically Signed   By: Geanie Cooley M.D.   On: 06/21/2013 16:45   US Pelvis Complete  06/21/2013   CLINICAL DATA:  Altered mental status. Evaluation for possible paraneoplastic syndrome.  EXAM: TRANSABDOMINAL AND TRANSVAGINAL ULTRASOUND OF PELVIS  TECHNIQUE: Both transabdominal and transvaginal ultrasound examinations of the pelvis were performed. Transabdominal technique was performed for global imaging of the pelvis including uterus, ovaries, adnexal regions, and pelvic cul-de-sac. It was necessary to proceed with endovaginal exam following the transabdominal exam to visualize the ovaries.  COMPARISON:  None  FINDINGS: Uterus  Removed.  Right ovary  Not visualized.  Left ovary  Not visualized.  Other findings  No free fluid.  IMPRESSION: The ovaries could not be visualized on transvaginal or transabdominal pelvic ultrasound. However, review of the CT scan of the pelvis dated 05/17/2013 does demonstrate that both ovaries appear normal, measuring 23 mm on the right and 16 mm on the left.   Electronically Signed   By: Geanie Cooley M.D.   On: 06/21/2013 16:45   Ct Abdomen Pelvis W Contrast  07/21/2013   CLINICAL DATA:  Tachycardia and dislodged J-tube. Abdominal pain and vomiting.  EXAM: CT ABDOMEN AND PELVIS WITH CONTRAST  TECHNIQUE: Multidetector CT imaging of the abdomen and pelvis was performed using the standard protocol following bolus administration of intravenous contrast.  CONTRAST:  OMNIPAQUE IOHEXOL 300 MG/ML  SOLN  COMPARISON:  DG ABD 2 VIEWS dated 07/19/2013; CT ABD/PELV WO CM dated 07/10/2013  FINDINGS: Small bilateral pleural effusions are seen,  right greater than left, with associated bibasilar airspace opacification likely reflecting atelectasis.  The liver and spleen are unremarkable in appearance. The patient is status post cholecystectomy, with clips noted along the gallbladder fossa. The pancreas and adrenal glands are unremarkable.  The kidneys are unremarkable in appearance. There is no evidence of hydronephrosis. No renal or ureteral stones are seen. Minimal nonspecific perinephric stranding is noted on the left side.  A replacement J-tube is seen ending at the jejunum at the left lower quadrant. A bowel suture line is noted along the jejunum, with distention of more proximal jejunum to 4.7 cm in diameter. There is no definite evidence of bowel obstruction; contrast progresses to the level of the rectum.  No free fluid is identified. The small bowel is unremarkable in appearance. Postoperative change is noted about the stomach. No acute vascular abnormalities are seen. Postoperative change is seen along the midline anterior abdominal wall.  The appendix is normal in caliber and filled with contrast, without evidence for appendicitis. The colon is largely decompressed and unremarkable in appearance.  The bladder is mildly distended and grossly unremarkable. The patient is status post hysterectomy. The ovaries are  relatively symmetric; no suspicious adnexal masses are seen. No inguinal lymphadenopathy is seen.  Mild diffuse soft tissue edema is seen along the abdominal wall and proximal lower extremities, raising question for mild anasarca.  No acute osseous abnormalities are identified.  IMPRESSION: 1. Replacement J-tube seen ending at the jejunum at the left lower quadrant. Likely chronic distention of more proximal jejunum to 4.7 cm in diameter, with associated bowel suture line, but no evidence of bowel obstruction. Ingested contrast progresses to the level of the rectum. 2. Small bilateral pleural effusions, right greater than left, with  associated bibasilar airspace opacification likely reflecting atelectasis. 3. Mild diffuse soft tissue edema along the abdominal wall and proximal lower extremities, raising question for mild anasarca.   Electronically Signed   By: Roanna Raider M.D.   On: 07/21/2013 05:55   Ir Removal Tun Cv Cath W/o Fl  07/11/2013   CLINICAL DATA:  Sepsis, concern for right internal jugular tunneled central catheter infection, finished plasmapheresis treatment, request for removal.  EXAM: REMOVAL OF TUNNELED CENTRAL VENOUS CATHETER  PROCEDURE: Risks and benefits of procedure discussed with the patient's son over the phone, verbal consent was obtained.  The right chest tunneled central catheter site was prepped with chlorhexidine. A sterile gown and gloves were worn during the procedure.  Utilizing manual manipulation, the subcutaneous cuff of the central catheter was freed. The catheter was then successfully removed in its entirety. A sterile dressing was applied over the catheter exit site.  IMPRESSION: Removal of tunneled right internal jugular central catheter utilizing manual manipulation. The procedure was uncomplicated.  Read By:  Pattricia Boss PA-C   Electronically Signed   By: Malachy Moan M.D.   On: 07/11/2013 11:39   Ir Replc Duoden/jejuno Tube Percut W/fluoro  07/13/2013   CLINICAL DATA:  Occluded surgical jejunostomy requiring exchange.  EXAM: JEJUNOSTOMY TUBE EXCHANGE  CONTRAST:  10 ml Omnipaque-300  FLUOROSCOPY TIME:  1 min and 24 seconds.  PROCEDURE: Informed consent was obtained by telephone from the patient's son.  The pre-existing jejunostomy and surrounding skin were prepped with Betadine. Attempt was made to inject contrast via the pre-existing tube. The tube was cut and removed over a hydrophilic guidewire. A new 12 French red rubber jejunostomy catheter was advanced over the wire.  Final catheter position was confirmed with a fluoroscopic spot image obtained after injection of contrast. The  catheter was secured at the exit site with a silk retention suture.  COMPLICATIONS: None.  FINDINGS: The pre-existing 48 French tube is occluded. A new 12 French tube was placed and advanced into the jejunum.  IMPRESSION: Replacement of occluded 10 French jejunostomy catheter. The tube was up sized to a 12 [place] catheter advanced into the jejunum over a guidewire.   Electronically Signed   By: Irish Lack M.D.   On: 07/13/2013 15:54   Dg Chest Port 1 View  07/20/2013   CLINICAL DATA:  Possible aspiration.  EXAM: PORTABLE CHEST - 1 VIEW  COMPARISON:  Chest radiograph and CTA of the chest performed 07/17/2013  FINDINGS: The patient's right PICC is noted ending about the distal SVC.  The previously noted bilateral pleural effusions have improved. Bibasilar airspace opacity likely reflects residual atelectasis, without definite evidence of aspiration. No pneumothorax is seen.  The cardiomediastinal silhouette is borderline normal in size. No acute osseous abnormalities are identified.  IMPRESSION: Previously noted bilateral pleural effusions have improved. Bibasilar airspace opacity likely reflects residual atelectasis, without definite evidence of aspiration.   Electronically Signed  By: Roanna RaiderJeffery  Chang M.D.   On: 07/20/2013 03:35   Dg Chest Portable 1 View  07/17/2013   CLINICAL DATA:  Abdominal pain, tachycardia.  EXAM: PORTABLE CHEST - 1 VIEW  COMPARISON:  CT ANGIO CHEST W/CM &/OR WO/CM dated 07/11/2013; DG CHEST 1V PORT dated 07/11/2013  FINDINGS: Moderate right pleural effusion, likely slightly increased since prior study. Interval removal of right hemodialysis catheter. Right PICC line is unchanged. Patchy right lung airspace disease and left basilar airspace disease medially, both slightly increased since prior study. No acute bony abnormality.  IMPRESSION: Increasing patchy bilateral airspace disease as above. Increasing moderate right effusion.   Electronically Signed   By: Charlett NoseKevin  Dover M.D.   On:  07/17/2013 03:54   Dg Chest Port 1 View  07/11/2013   CLINICAL DATA:  Shortness of breath, evaluate pulmonary infiltrates  EXAM: PORTABLE CHEST - 1 VIEW  COMPARISON:  DG CHEST 1V PORT dated 07/10/2013; DG CHEST 1V PORT dated 07/03/2013; CT CHEST W/CM dated 06/21/2013; IR FLUORO GUIDE CV LINE*R* dated 06/20/2013; DG CHEST 1V PORT dated 05/11/2013  FINDINGS: Grossly unchanged cardiac silhouette and mediastinal contours given patient rotation. Stable position of support apparatus. Grossly unchanged right mid lung heterogeneous airspace opacities with worsening bilateral medial basilar airspace opacities. No definite pleural effusion or pneumothorax. No evidence of edema. A minimal amount of enteric contrast is seen within in the gastric fundus. Post cholecystectomy. Grossly unchanged bones.  IMPRESSION: 1.  Stable positioning of support apparatus.  No pneumothorax. 2. Worsening bibasilar airspace disease, possibly atelectasis though progression of multifocal infection / aspiration is not excluded. A follow-up chest radiograph in 4 to 6 weeks after treatment is recommended to ensure resolution.   Electronically Signed   By: Simonne ComeJohn  Watts M.D.   On: 07/11/2013 07:47   Dg Chest Port 1 View  07/10/2013   CLINICAL DATA:  Shortness of breath.  Hypotension  EXAM: PORTABLE CHEST - 1 VIEW  COMPARISON:  DG CHEST 1V PORT dated 07/03/2013; CT CHEST W/CM dated 06/21/2013  FINDINGS: Right internal jugular dialysis catheter tip: Right atrium. Thoracic spondylosis noted. There is a band of airspace opacity in the right upper lobe just above the minor fissure. Improved aeration at the right lung base.  IMPRESSION: Improved aeration at the right lung base, with but there is a band of airspace opacity in the right upper lobe favoring atelectasis over pneumonia. Next item thoracic spondylosis.   Electronically Signed   By: Herbie BaltimoreWalt  Liebkemann M.D.   On: 07/10/2013 08:27   Dg Chest Port 1 View  07/03/2013   CLINICAL DATA:  Tubes and lines in  good position, detailed above.  EXAM: PORTABLE CHEST - 1 VIEW  COMPARISON:  Chest CT 06/21/2013  FINDINGS: There is nasogastric tube which terminates near the pylorus. Right IJ catheter, tip in the upper right atrium. Right upper extremity PICC, tip at the upper cavoatrial junction.  Oral contrast noted within the gastric fundus and small bowel.  Haziness of the right base, with volume loss. Node definitive consolidation.  No cardiomegaly.  IMPRESSION: 1. Negative for pneumonia. 2. Haziness of the right lower chest, likely pleural fluid or atelectasis. 3. Tubes and lines in good position, detailed above.   Electronically Signed   By: Tiburcio PeaJonathan  Watts M.D.   On: 07/03/2013 03:35   Dg Abd 2 Views  07/19/2013   CLINICAL DATA:  Abdominal pain and vomiting, recent bowel resection.  EXAM: ABDOMEN - 2 VIEW  COMPARISON:  DG ABD PORTABLE 1V dated 07/17/2013  FINDINGS: Bowel gas pattern appears nondilated and nonobstructive, with an overall paucity of bowel gas. A few scattered air-fluid levels seen on the lateral decubitus views. No intraperitoneal free air.  Jejunostomy tube projects and left lower quadrant, no residual enteric contrast. Surgical staple line projects in left mid abdomen. Surgical clips in the right upper quadrant likely reflect cholecystectomy. Soft tissue planes and included osseous structures are nonsuspicious.  IMPRESSION: A few scattered air-fluid levels may reflect mild ileus, less likely or early bowel obstruction.  Jejunostomy tube in place without residual enteric contrast.   Electronically Signed   By: Awilda Metro   On: 07/19/2013 22:27   Dg Abd Portable 1v  07/17/2013   CLINICAL DATA:  J-tube placement.  EXAM: PORTABLE ABDOMEN - 1 VIEW  COMPARISON:  07/13/2013  FINDINGS: Contrast was injected through the jejunostomy tube. This opacifies small bowel loops in the upper pelvis. No evidence of contrast extravasation.  Slightly prominent bowel loops or superiorly in the abdomen, nonspecific.  Prior cholecystectomy. No free air.  IMPRESSION: Jejunostomy tube within upper pelvic small bowel loops. Mildly prominent abdominal small bowel loops which could reflect mild focal ileus although early or partial small bowel obstruction cannot be excluded.   Electronically Signed   By: Charlett Nose M.D.   On: 07/17/2013 03:57   Dg Abd Portable 1v  07/12/2013   CLINICAL DATA:  Abdominal pain  EXAM: PORTABLE ABDOMEN - 1 VIEW  COMPARISON:  07/11/2013  FINDINGS: Postsurgical changes are again seen. A feeding catheter is noted. Scattered large and small bowel gas is seen. No obstructive changes are noted. No definitive free air is seen.  IMPRESSION: No significant change from the prior exam.   Electronically Signed   By: Alcide Clever M.D.   On: 07/12/2013 07:44   Dg Abd Portable 1v  07/11/2013   CLINICAL DATA:  Abdominal distention, evaluate for small bowel obstruction  EXAM: PORTABLE ABDOMEN - 1 VIEW  COMPARISON:  CT ABD/PELV WO CM dated 07/10/2013; DG ABD PORTABLE 1V dated 07/10/2013; DG ABD PORTABLE 1V dated 06/30/2013  FINDINGS: There has been passage of the majority of previously administered enteric contrast. A minimal amount of enteric contrast remains within the colon. Paucity of bowel gas without definite evidence of obstruction. Nondiagnostic evaluation pneumoperitoneum secondary supine positioning exclusion of lower thorax. No definite pneumatosis or portal venous gas.  A feeding jejunostomy tube overlies the left mid/ lower abdomen.  A vertically aligned skin staples overlie the right mid abdomen. Post cholecystectomy.  Unchanged bones.  IMPRESSION: Paucity of bowel gas, however there has been apparent passage of previously ingested enteric contrast. No definite evidence of obstruction.   Electronically Signed   By: Simonne Come M.D.   On: 07/11/2013 08:26   Dg Abd Portable 1v  07/10/2013   CLINICAL DATA:  Assess for ileus  EXAM: PORTABLE ABDOMEN - 1 VIEW  COMPARISON:  June 30, 2013  FINDINGS: There is  persistent air-filled dilated small bowel loops in the left abdomen. Air is noted in the colon. Skin staples are projected over the right abdomen. Patient is status post prior cholecystectomy.  IMPRESSION: Persistent ileus.   Electronically Signed   By: Sherian Rein M.D.   On: 07/10/2013 02:30   Dg Abd Portable 1v  06/30/2013   CLINICAL DATA:  NG tube placement.  EXAM: PORTABLE ABDOMEN - 1 VIEW  COMPARISON:  Single view of the abdomen 06/27/2013. Upper GI/small bowel follow-through 06/28/2013  FINDINGS: NG tube is in place in good position  with the tip in this distal stomach. Contrast material in small bowel from the comparison upper GI series/small bowel follow-through. Small bowel loops are dilated. Only a small amount of loculated barium is seen in the colon.  IMPRESSION: NG tube in good position.  Small bowel obstruction.   Electronically Signed   By: Drusilla Kanner M.D.   On: 06/30/2013 19:01   Dg Ugi W/small Bowel  06/28/2013   CLINICAL DATA:  53 year old female postop 2 days since percutaneous jejunostomy tube placement. Drainage from NG tube. Abdominal pain. Initial encounter.  EXAM: UPPER GI SERIES WITH SMALL BOWEL FOLLOW-THROUGH  FLUOROSCOPIC GUIDED NG TUBE PLACEMENT  TECHNIQUE: Combined double contrast and single contrast upper GI series using effervescent crystals, thick barium, and thin barium. Subsequently, serial images of the small bowel were obtained including spot views of the terminal ileum.  COMPARISON:  KUB 06/27/2013 and earlier.  FLUOROSCOPY TIME:  9 min and 18 seconds.  FINDINGS: Preprocedural scout view of the abdomen demonstrates a small volume of loculated barium in the colon. This is related to the 06/17/2013 NG tube placement/ confirmation study.  Scout view demonstrates the recently placed right an mid lower abdomen percutaneous jejunostomy. Right lower abdomen skin staples in place.  The scout view also demonstrates the patient's NG tube is looped back up into the esophagus.  This was evaluated in real-time with fluoroscopy, and a wire was placed through the tube in an effort to maneuver the tip back into the stomach. However, this was unsuccessful an the tube had be retracted into the nasopharynx. Using fluoroscopic guidance, the tube was then advanced with a wire into the stomach, such that the tip and side hole are within the stomach.  Given the recent abdominal surgery, initially water-soluble contrast was planned, however prominent gastroesophageal reflux of contrast was noted early on, and the decision was made to switch contrast to thin barium. Water-soluble contrast was fully aspirated from the stomach. Subsequently, barium contrast was slowly administered.  The stomach appears small. No gastrojejunostomy is evident. After a short delay, barium emptied the stomach into the proximal duodenum. The second portion of the duodenum is dilated.  After 20 additional min significantly dilated proximal jejunum is opacified, with the small bowel caliber up to 58 mm.  At this point study was discussed with Dr. Jimmye Norman . We decided to inject the percutaneous jejunostomy tube, and give the barium more time to opacified a small bowel in hopes of loose to dating the small bowel obstruction transition point.  Barium injection through the jejunostomy tube demonstrates normal small bowel loops at the jejunostomy site. This barium was then aspirated.  Subsequently a 3 hr delayed image was obtained demonstrating a gradual tapering of small bowel loops into the right lower abdomen, heading toward the vicinity of the jejunostomy. These smaller loops measure 23 mm diameter (arrow).  At this point, Dr. Lindie Spruce advised that no additional imaging was needed.  IMPRESSION: 1. High-grade small bowel obstruction. Dilated duodenum and proximal jejunum up to 58 mm diameter. 2. Small bowel loops slowly taper to the right lower abdomen in the vicinity of the percutaneous jejunostomy. 3. The jejunostomy was  injected with contrast, and is situated within normal decompressed small bowel. 4. Diminutive stomach. No gastrojejunostomy is evident. Prominent gastroesophageal reflux. 5. At the beginning of the exam the patient's NG tube was malpositioned. This was withdrawn to the nasopharynx and repositioned into the stomach under fluoroscopic guidance.   Electronically Signed   By: Augusto Gamble  M.D.   On: 06/28/2013 16:29         Subjective: Patient had no further vomiting overnight. She is pleasantly confused. She denies any chest discomfort, shortness breath, nausea, vomiting, abdominal pain, dysuria. Denies any headache or dizziness.  Objective: Filed Vitals:   07/20/13 1227 07/20/13 1408 07/20/13 2121 07/21/13 0539  BP: 115/69 104/61 111/52 120/84  Pulse: 124 106 122 101  Temp:  98 F (36.7 C) 99.4 F (37.4 C) 99 F (37.2 C)  TempSrc:  Axillary Oral Oral  Resp:  20 20 20   Weight:    78.26 kg (172 lb 8.5 oz)  SpO2:  100% 100% 100%    Intake/Output Summary (Last 24 hours) at 07/21/13 1040 Last data filed at 07/21/13 4401  Gross per 24 hour  Intake      0 ml  Output      0 ml  Net      0 ml   Weight change: -0.802 kg (-1 lb 12.3 oz) Exam:   General:  Pt is alert, follows commands appropriately, not in acute distress  HEENT: No icterus, No thrush,  [place]/AT  Cardiovascular: RRR, S1/S2, no rubs, no gallops  Respiratory: CTA bilaterally, no wheezing, no crackles, no rhonchi  Abdomen: Soft/+BS, non tender, non distended, no guarding  Extremities: 1+ LE edema, No lymphangitis, No petechiae, No rashes, no synovitis  Data Reviewed: Basic Metabolic Panel:  Recent Labs Lab 07/16/13 0550 07/17/13 0335 07/18/13 0615 07/19/13 0455 07/20/13 0510 07/21/13 0500  NA  --  144 142 143 141 138  K 3.8 3.7 3.7 4.4 4.5 3.9  CL  --  108 106 107 105 104  CO2  --  24 26 25 28 27   GLUCOSE  --  85 86 93 85 85  BUN  --  4* 3* 3* 5* 6  CREATININE  --  0.55 0.54 0.53 0.53 0.61  CALCIUM  --   8.0* 7.9* 8.0* 8.1* 7.7*  MG 1.9  --   --   --   --   --    Liver Function Tests:  Recent Labs Lab 07/15/13 0500 07/17/13 0335 07/20/13 0510 07/21/13 0500  AST 15 21 22 22   ALT 9 10 10 11   ALKPHOS 55 68 78 76  BILITOT 0.3 0.5 0.3 0.3  PROT 4.3* 4.9* 4.8* 4.5*  ALBUMIN 1.9* 2.3* 2.2* 2.0*    Recent Labs Lab 07/20/13 0510  LIPASE 15    Recent Labs Lab 07/17/13 1320  AMMONIA 17   CBC:  Recent Labs Lab 07/17/13 0335 07/18/13 0615 07/19/13 0455 07/20/13 0510 07/21/13 0500  WBC 8.9 7.7 6.1 6.8 6.4  NEUTROABS 6.3  --   --   --   --   HGB 11.5* 10.8* 10.2* 10.0* 9.8*  HCT 34.1* 31.9* 31.2* 30.6* 29.4*  MCV 94.7 94.9 96.6 95.9 96.4  PLT 130* 143* 137* 136* 146*   Cardiac Enzymes: No results found for this basename: CKTOTAL, CKMB, CKMBINDEX, TROPONINI,  in the last 168 hours BNP: No components found with this basename: POCBNP,  CBG:  Recent Labs Lab 07/20/13 1639 07/20/13 2037 07/21/13 0010 07/21/13 0405 07/21/13 0752  GLUCAP 99 87 87 76 82    Recent Results (from the past 240 hour(s))  CULTURE, BLOOD (ROUTINE X 2)     Status: None   Collection Time    07/12/13 10:40 AM      Result Value Ref Range Status   Specimen Description BLOOD LEFT FOREARM   Final  Special Requests BOTTLES DRAWN AEROBIC AND ANAEROBIC 10CC   Final   Culture  Setup Time     Final   Value: 07/12/2013 14:02     Performed at [facility]   Culture     Final   Value: NO GROWTH 5 DAYS     Performed at [facility]   Report Status 07/18/2013 FINAL   Final  CULTURE, BLOOD (ROUTINE X 2)     Status: None   Collection Time    07/12/13 10:50 AM      Result Value Ref Range Status   Specimen Description BLOOD LEFT ARM   Final   Special Requests BOTTLES DRAWN AEROBIC AND ANAEROBIC 10CC   Final   Culture  Setup Time     Final   Value: 07/12/2013 14:02     Performed at [facility]   Culture     Final   Value: NO GROWTH 5 DAYS     Performed at [facility]   Report Status 07/18/2013 FINAL   Final  CULTURE, BLOOD (ROUTINE X 2)     Status: None   Collection Time    07/17/13  3:35 AM      Result Value Ref Range Status   Specimen Description BLOOD RIGHT PICC LINE   Final   Special Requests BOTTLES DRAWN AEROBIC AND ANAEROBIC 10CC   Final   Culture  Setup Time     Final   Value: 07/17/2013 08:11     Performed at [facility]   Culture     Final   Value:        BLOOD CULTURE RECEIVED NO GROWTH TO DATE CULTURE WILL BE HELD FOR 5 DAYS BEFORE ISSUING A FINAL NEGATIVE REPORT     Performed at [facility]   Report Status PENDING   Incomplete  CULTURE, BLOOD (ROUTINE X 2)     Status: None   Collection Time    07/17/13  3:52 AM      Result Value Ref Range Status   Specimen Description BLOOD LEFT ARM   Final   Special Requests BOTTLES DRAWN AEROBIC ONLY 10CC   Final   Culture  Setup Time     Final   Value: 07/17/2013 08:11     Performed at [facility]   Culture     Final   Value:        BLOOD CULTURE RECEIVED NO GROWTH TO DATE CULTURE WILL BE HELD FOR 5 DAYS BEFORE ISSUING A FINAL NEGATIVE REPORT     Performed at [facility]   Report Status PENDING   Incomplete  URINE CULTURE     Status: None   Collection Time    07/17/13  4:27 AM      Result Value Ref Range Status   Specimen Description URINE, CATHETERIZED   Final   Special Requests Normal   Final   Culture  Setup Time     Final   Value: 07/16/2013 03:22     Performed at [facility] Count     Final   Value: NO GROWTH     Performed at [facility]   Culture     Final   Value: NO GROWTH     Performed at [facility]   Report Status 07/18/2013 FINAL   Final  CLOSTRIDIUM DIFFICILE BY PCR     Status: None   Collection Time    07/17/13  10:30 AM      Result Value Ref Range Status   C difficile by pcr NEGATIVE  NEGATIVE Final     Scheduled Meds: . albumin human  25 g Intravenous Once  . chlorhexidine  15 mL  Mouth/Throat BID  . ciprofloxacin  400 mg Intravenous Q12H  . enoxaparin (LOVENOX) injection  40 mg Subcutaneous Q24H  . ferrous sulfate  325 mg Oral BID WC  . folic acid  1 mg Oral Daily  . free water  200 mL Per Tube 3 times per day  . geriatric multivitamins-minerals  15 mL Oral Daily  . levETIRAcetam  1,000 mg Oral BID  . metoCLOPramide (REGLAN) injection  5 mg Intravenous 3 times per day  . metoprolol tartrate  75 mg Oral BID  . mirtazapine  30 mg Oral QHS  . pantoprazole sodium  40 mg Per Tube Q1200  . risperiDONE  0.5 mg Oral BID  . sodium chloride  10-40 mL Intracatheter Q12H  . thiamine  100 mg Oral Daily  . valproic acid  500 mg Oral TID   Continuous Infusions: . sodium chloride    . feeding supplement (VITAL AF 1.2 CAL) 1,000 mL (07/19/13 2031)  . feeding supplement (VITAL AF 1.2 CAL)       September Mormile, DO  [facility] Pager [phone]  If 7PM-7AM, please contact night-coverage [email or web address] Password [email or web address] 07/21/2013, 10:40 AM   LOS: 4 days

## 2013-07-21 NOTE — Progress Notes (Signed)
Pt. Began to vomit bile after her tube feeding had been restarted for about 2 hours+, I suctioned pt.'s mouth and cleaned pt. Up and changed gown and linen. I contacted M.D. And advised him of pt. Vomiting. I gave pt. Nausea medication. MD advised me to put in an order for a NG tube to be inserted and put it on intermittent low suction. When I tried to insert NG tube pt. Refused and stated she would only allow the tube to be inserted if she was put to sleep first. I notified MD of pt.'s refusal. I educated pt. Of the reason MD order NG tube to help with her her abdominal obstruction, but pt. Still refused. MD stated he would talk to pt. About why she needs the NG tube.

## 2013-07-21 NOTE — Progress Notes (Signed)
Enteral feeds were restarted at 30 cc per hour today. Unfortunately, the patient began having vomiting again after the feeds were started. As noted, the patient had a CT of the abdomen and pelvis on for the 21st 2015 which did not show obvious obstruction but showed jejunal dilatation proximal to the J-tube. Enteral feeds were stopped. I contacted general surgery consult. They recommended placing an NG tube to low intermittent suction. An order was placed, but the patient refused the NG tube. I want to talk to the patient discussed the risks, benefits, and alternatives. I explained to the patient that she will have recurrent vomiting when the tube feeds are restarted without the NG.  She understood and does not want the NG tube  DTat

## 2013-07-21 NOTE — Progress Notes (Signed)
MD on call notified of completed Ct of abdomen. Tube feeding on hold until further notice.

## 2013-07-21 NOTE — Progress Notes (Signed)
Patient ID: Joan [name], female   DOB: 01/20/1961, 53 y.o.   MRN: [id number] Patient known to surgery service after placement of jejunostomy tube by Dr Lindie SpruceWyatt.  This was complicated by obstruction at site and required revision with sbr.  Yesterday she was begun to be fed via j tube and developed emesis soon after. She has ct that shows dilated jejunum up to either the j tube site or staple line and contrast quickly passed through to the rectum.  Her abdomen is soft and nontender, j tube appears to be in place, wound clean. I don't see that j tube feeds should cause emesis and may be from another etiology.  dont think she needs ng after reviewing information myself.  I think she can be fed via the j tube regardless of nausea/emesis unless it appears to be her tube feeds.  Would restart slowly.

## 2013-07-22 LAB — CBC
HCT: 29.5 % — ABNORMAL LOW (ref 36.0–46.0)
HEMOGLOBIN: 9.7 g/dL — AB (ref 12.0–15.0)
MCH: 31.6 pg (ref 26.0–34.0)
MCHC: 32.9 g/dL (ref 30.0–36.0)
MCV: 96.1 fL (ref 78.0–100.0)
Platelets: 151 10*3/uL (ref 150–400)
RBC: 3.07 MIL/uL — ABNORMAL LOW (ref 3.87–5.11)
RDW: 19.7 % — ABNORMAL HIGH (ref 11.5–15.5)
WBC: 6.8 10*3/uL (ref 4.0–10.5)

## 2013-07-22 LAB — GLUCOSE, CAPILLARY
GLUCOSE-CAPILLARY: 97 mg/dL (ref 70–99)
GLUCOSE-CAPILLARY: 79 mg/dL (ref 70–99)
GLUCOSE-CAPILLARY: 81 mg/dL (ref 70–99)
GLUCOSE-CAPILLARY: 92 mg/dL (ref 70–99)
Glucose-Capillary: 70 mg/dL (ref 70–99)
Glucose-Capillary: 73 mg/dL (ref 70–99)

## 2013-07-22 LAB — BASIC METABOLIC PANEL WITH GFR
BUN: 5 mg/dL — ABNORMAL LOW (ref 6–23)
CO2: 26 meq/L (ref 19–32)
Calcium: 8.1 mg/dL — ABNORMAL LOW (ref 8.4–10.5)
Chloride: 105 meq/L (ref 96–112)
Creatinine, Ser: 0.55 mg/dL (ref 0.50–1.10)
GFR calc Af Amer: >90 mL/min
GFR calc non Af Amer: >90 mL/min
Glucose, Bld: 80 mg/dL (ref 70–99)
Potassium: 3.5 meq/L — ABNORMAL LOW (ref 3.7–5.3)
Sodium: 141 meq/L (ref 137–147)

## 2013-07-22 MED ORDER — MAGNESIUM SULFATE 40 MG/ML IJ SOLN: 2.0000 g | Freq: Once | INTRAMUSCULAR | Status: DC

## 2013-07-22 MED ORDER — METOCLOPRAMIDE HCL 5 MG/ML IJ SOLN
10.0000 mg | Freq: Three times a day (TID) | INTRAMUSCULAR | Status: DC
  Administered 2013-07-22 – 2013-07-23 (×2): 10 mg via INTRAVENOUS
  Filled 2013-07-22 (×5): qty 2

## 2013-07-22 MED ORDER — VITAL AF 1.2 CAL PO LIQD
1000.0000 mL | ORAL | Status: DC
  Administered 2013-07-23: 1000 mL
  Filled 2013-07-22 (×3): qty 1000

## 2013-07-22 MED ORDER — LEVETIRACETAM 100 MG/ML PO SOLN
1000.0000 mg | Freq: Two times a day (BID) | ORAL | Status: DC
  Administered 2013-07-22 – 2013-07-24 (×4): 1000 mg via ORAL
  Filled 2013-07-22 (×5): qty 10

## 2013-07-22 MED ORDER — METOPROLOL TARTRATE 50 MG PO TABS
75.0000 mg | ORAL_TABLET | Freq: Two times a day (BID) | ORAL | Status: DC
  Administered 2013-07-22 – 2013-07-24 (×4): 75 mg via ORAL
  Filled 2013-07-22 (×5): qty 1

## 2013-07-22 MED ORDER — VALPROIC ACID 250 MG/5ML PO SYRP
500.0000 mg | ORAL_SOLUTION | Freq: Three times a day (TID) | ORAL | Status: DC
  Administered 2013-07-22 – 2013-07-24 (×6): 500 mg via ORAL
  Filled 2013-07-22 (×8): qty 10

## 2013-07-22 NOTE — Progress Notes (Signed)
No sign of obstruction; Restart tube feeds, advance as tolerated No indications for surgical intervention at this time.  Wilmon ArmsMatthew K. Corliss Skainssuei, MD, [facility]  General/ Trauma Surgery  07/22/2013 2:25 PM

## 2013-07-22 NOTE — Progress Notes (Signed)
Patient ID: Joan [name], female   DOB: 10/18/1960, 53 y.o.   MRN: [id number]    Subjective: Pt sitting up.  TF just now being restarted.  No further emesis per RN  Objective: Vital signs in last 24 hours: Temp:  [97.9 F (36.6 C)-98.9 F (37.2 C)] 97.9 F (36.6 C) (02/23 0534) Pulse Rate:  [93-143] 128 (02/23 1022) Resp:  [20] 20 (02/23 0534) BP: (106-180)/(60-96) 123/73 mmHg (02/23 1022) SpO2:  [90 %-100 %] 100 % (02/23 1022) Weight:  [172 lb 6.4 oz (78.2 kg)] 172 lb 6.4 oz (78.2 kg) (02/23 0424) Last BM Date: 07/21/13  Intake/Output from previous day:   Intake/Output this shift:    PE: Abd: soft, NT, ND, J-tube in place with no issues  Lab Results:   Recent Labs  07/21/13 0500 07/22/13 0500  WBC 6.4 6.8  HGB 9.8* 9.7*  HCT 29.4* 29.5*  PLT 146* 151   BMET  Recent Labs  07/21/13 0500 07/22/13 0500  NA 138 141  K 3.9 3.5*  CL 104 105  CO2 27 26  GLUCOSE 85 80  BUN 6 5*  CREATININE 0.61 0.55  CALCIUM 7.7* 8.1*   PT/INR No results found for this basename: LABPROT, INR,  in the last 72 hours CMP     Component Value Date/Time   NA 141 07/22/2013 0500   K 3.5* 07/22/2013 0500   CL 105 07/22/2013 0500   CO2 26 07/22/2013 0500   GLUCOSE 80 07/22/2013 0500   BUN 5* 07/22/2013 0500   CREATININE 0.55 07/22/2013 0500   CALCIUM 8.1* 07/22/2013 0500   PROT 4.5* 07/21/2013 0500   ALBUMIN 2.0* 07/21/2013 0500   AST 22 07/21/2013 0500   ALT 11 07/21/2013 0500   ALKPHOS 76 07/21/2013 0500   BILITOT 0.3 07/21/2013 0500   GFRNONAA >90 07/22/2013 0500   GFRAA >90 07/22/2013 0500   Lipase     Component Value Date/Time   LIPASE 15 07/20/2013 0510       Studies/Results: Ct Abdomen Pelvis W Contrast  07/21/2013   CLINICAL DATA:  Tachycardia and dislodged J-tube. Abdominal pain and vomiting.  EXAM: CT ABDOMEN AND PELVIS WITH CONTRAST  TECHNIQUE: Multidetector CT imaging of the abdomen and pelvis was performed using the standard protocol following bolus administration of  intravenous contrast.  CONTRAST:  100mL OMNIPAQUE IOHEXOL 300 MG/ML  SOLN  COMPARISON:  DG ABD 2 VIEWS dated 07/19/2013; CT ABD/PELV WO CM dated 07/10/2013  FINDINGS: Small bilateral pleural effusions are seen, right greater than left, with associated bibasilar airspace opacification likely reflecting atelectasis.  The liver and spleen are unremarkable in appearance. The patient is status post cholecystectomy, with clips noted along the gallbladder fossa. The pancreas and adrenal glands are unremarkable.  The kidneys are unremarkable in appearance. There is no evidence of hydronephrosis. No renal or ureteral stones are seen. Minimal nonspecific perinephric stranding is noted on the left side.  A replacement J-tube is seen ending at the jejunum at the left lower quadrant. A bowel suture line is noted along the jejunum, with distention of more proximal jejunum to 4.7 cm in diameter. There is no definite evidence of bowel obstruction; contrast progresses to the level of the rectum.  No free fluid is identified. The small bowel is unremarkable in appearance. Postoperative change is noted about the stomach. No acute vascular abnormalities are seen. Postoperative change is seen along the midline anterior abdominal wall.  The appendix is normal in caliber and filled with contrast, without evidence for  appendicitis. The colon is largely decompressed and unremarkable in appearance.  The bladder is mildly distended and grossly unremarkable. The patient is status post hysterectomy. The ovaries are relatively symmetric; no suspicious adnexal masses are seen. No inguinal lymphadenopathy is seen.  Mild diffuse soft tissue edema is seen along the abdominal wall and proximal lower extremities, raising question for mild anasarca.  No acute osseous abnormalities are identified.  IMPRESSION: 1. Replacement J-tube seen ending at the jejunum at the left lower quadrant. Likely chronic distention of more proximal jejunum to 4.7 cm in  diameter, with associated bowel suture line, but no evidence of bowel obstruction. Ingested contrast progresses to the level of the rectum. 2. Small bilateral pleural effusions, right greater than left, with associated bibasilar airspace opacification likely reflecting atelectasis. 3. Mild diffuse soft tissue edema along the abdominal wall and proximal lower extremities, raising question for mild anasarca.   Electronically Signed   By: Roanna Raider M.D.   On: 07/21/2013 05:55    Anti-infectives: Anti-infectives   Start     Dose/Rate Route Frequency Ordered Stop   07/18/13 0000  ciprofloxacin (CIPRO) 400 MG/200ML SOLN     400 mg 200 mL/hr over 60 Minutes Intravenous Every 12 hours 07/18/13 1921     07/17/13 1300  ciprofloxacin (CIPRO) IVPB 400 mg     400 mg 200 mL/hr over 60 Minutes Intravenous Every 12 hours 07/17/13 1217     07/17/13 0800  vancomycin (VANCOCIN) IVPB 1000 mg/200 mL premix     1,000 mg 200 mL/hr over 60 Minutes Intravenous  Once 07/17/13 0754 07/17/13 0954       Assessment/Plan  1. S/p J-tube placement and revision 2. Intermittent N/V  Plan: 1. Restart TFs and advance slowly.  Her dilatation on her scan may be chronic.  However, despite this, her contrast goes straight through to her colon.  She has no evidence of an obstruction.  No need for NGT.  She may intermittently have nausea and emesis, but as long as this isn't TFs, she can continue to have these running.  No further surgical indications.  Please call as needed.   LOS: 5 days    Temitayo Covalt E 07/22/2013, 10:40 AM Pager: [phone]

## 2013-07-22 NOTE — Progress Notes (Signed)
Medicare Important Message given. Tj Kitchings J. Delsin Copen, RN, BSN, NCM [phone].   

## 2013-07-22 NOTE — Progress Notes (Signed)
Tube feeding stopped overnight.  Currently not infusing.  MD has been made aware.  Ordered to resume feeding at 4630mL/hr.   Nino Glowourtney Akyla Vavrek RN

## 2013-07-22 NOTE — Progress Notes (Signed)
Sinus tachycardia/SIRS  -Tachycardia improving with the restart of atenolol-->the patient was switched to metoprolol tartrate  -increase metoprolol tartrate to 75mg  bid  -According to the medical record this has been a chronic problem for the last several weeks which showed some mild improvement after beta blocker was started on the last admission  -This is likely due to her acute medical illness  -Right upper extremity duplex negative for DVT  -appreciate Dr. Andrey CampanileWilson, general surgery assistance  -abdominal binder at all times  -Continue ciprofloxacin IV--plan to continue until 07/26/2013 if repeat blood cultures are negative--this will signify 14 days of antibiotic therapy from the last negative culture  -Blood cultures--negative  -duplex RUE to r/o DVT  -CT angiogram chest is negative for pulmonary embolus  -Urinalysis negative for pyuria  Acute encephalopathy  -Unclear what the patient's baseline is; however after speaking to the patient's son the patient is near her baseline. The patient's mentation significantly improved from the time of admission.  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 Pt received diatech catheter on 06/20/2013 placement and received and finished plasmapheresis  -speech to eval swallowing--dysphagia 1 diet with thin liquids  -Check serum B12--667  - RBC folate--693  - RPR--NR  -ammonia--17  -Updated information revealed that the patient did not receive any medications during her short stay [name]--question seizure with postictal state as reason for patient's acute encephalopathy  -The patient has outpatient neurology appointment on 08/23/2013 which she should keep  Recurrent vomiting/TF intolerance/Gastroparesis -ABD xray shows few scatter fluid levels--?ileus  -pt has had BMs  -CT abdomen--negative for bowel obstruction. Chronic proximal jejunal dilatation. Small bilateral pleural effusions   -continue reglan--increase to 10mg  q 8hr -check VPA level--28.5  - lipase--15 -appreciate surgical evaluation-->restart TF  -Restart enteral feeds at 30 cc per hour--leave at 40cc/hr overnight -restart po meds Hx of Convulsions/Seizures  -Pt has a PMH of seizures and catatonia.  - 06/20/13 MRI of the brain showed resolution of thalamic signal abnormality  -continue current AEDs  Hx of Anxiety  -Continue pt on Remeron  -Patient appears to have had a psychiatric history  -Appears to be stable at this time  -Psych prescribed Risperdal 0.5 mg bid previous admission  HTN  -Increase metoprolol to 75 mg twice a day  Malnutrition with history of Gastric Bypass/partial gastrectomy  - encourage PO intake with TF  -J-tube resutured and stable  -Gen Surg consulted, J-tube placed and underwent revision of j tube with exploratory laparotomy on 2/3 and small bowel resection.  -She is able to take a dysphagia 1 diet by mouth which I encouraged the patient to do  Bacteremia  -On her previous admission, the patient had Klebsiella pneumoniae bacteremia for which she was discharged on ciprofloxacin  -She was continued on ciprofloxacin throughout her hospitalization  -Continue ciprofloxacin through 07/26/2013  -Repeat blood cultures have remained negative.  Thrombocytopenia  -No acute bleeding monitor  -chronic  Pleural effusions  -Currently no respiratory distress  -Oxygen saturation 97-100% on room air  -Remains stable when compared to prior CT angiogram of the chest on 07/11/2013  -repeat CXR 07/19/13 shows improvement of effusions  Loose Stools  -Patient had large volume yellow loose stool in the ED  -C. difficile PCR negative  Family Communication: Pt at beside  Disposition Plan: SNF 07/22/13 if stable

## 2013-07-22 NOTE — Progress Notes (Signed)
Nutrition Follow-up  Intervention: Continue Vital AF 1.2 @ 30 ml/hr via J-tube and increase by 10 ml every 4 hours as tolerated to goal rate of 60 ml/hr.  At goal rate, tube feeding regimen will provide 1728 kcal, 108 grams of protein, and 1168 ml of H2O.  Encourage PO intake as tolerated  Nutrition Diagnosis: Inadequate oral intake related to poor appetite/dysphagia as evidenced by meal completion <20% and NPO status PTA; ongoing  Goal: Pt to meet >/= 90% of their estimated nutrition needs; currently unmet  Monitor:  PO intake, TF tolerance/rate, Weight trends, labs   ASSESSMENT: 53 year old female with a history of seizure disorder, hypertension, peripheral neuropathy, meningoencephalitis of unclear etiology presented from [facility] with tachycardia and dislodged J-tube. The patient was recently discharged from the hospital after a prolonged stay from 06/19/2013 through 07/16/2013 due to bowel obstruction with J-tube revision, bacteremia, UTI, meningoencephalitis, and protein calorie malnutrition. The patient was discharged to her skilled nursing facility with instructions for 10 additional days of ciprofloxacin through her PICC line. In the ED, the patient was noted to be tachycardic as stated above. Abdominal x-ray showed that the J-tube was in the small bowel loops. However there was concern for possible ileus and PSBO could not be excluded.   Per MD note, pt had emesis with tube feedings. Enteral feeds were restarted yesterday at 30 ml/hr and pt began having vomiting again. Order for NG tube was placed but. Pt refused the NG tube. Per surgery note, don't think j tube feeds should cause emesis, may be from another etiology. Per nursing notes, meal completion continues to be 0%. Pt's weight has dropped 3 lbs in the past 5 days. Upper extremity edema has improved.    Height: Ht Readings from Last 1 Encounters:  07/06/13 5\' 3"  (1.6 m)    Weight: Wt Readings from Last 1 Encounters:   07/22/13 172 lb 6.4 oz (78.2 kg)   BMI:  Body mass index is 30.55 kg/(m^2).  Estimated Nutritional Needs: Kcal: 1760-1970 Protein: 85-100 grams Fluid: 2.2 L/day  Skin: +2 generalized edema, +1 RUE edema, +2 RLE and LLE edema; multiple abdominal incisions  Diet Order: Dysphagia  EDUCATION NEEDS: -No education needs identified at this time   Intake/Output Summary (Last 24 hours) at 07/22/13 1021 Last data filed at 07/22/13 0815  Gross per 24 hour  Intake      0 ml  Output      0 ml  Net      0 ml  +6.3 L since admission  Last BM: 2/22, diarrhea  Labs:   Recent Labs Lab 07/16/13 0550  07/20/13 0510 07/21/13 0500 07/22/13 0500  NA  --   < > 141 138 141  K 3.8  < > 4.5 3.9 3.5*  CL  --   < > 105 104 105  CO2  --   < > 28 27 26   BUN  --   < > 5* 6 5*  CREATININE  --   < > 0.53 0.61 0.55  CALCIUM  --   < > 8.1* 7.7* 8.1*  MG 1.9  --   --   --   --   GLUCOSE  --   < > 85 85 80  < > = values in this interval not displayed.  CBG (last 3)   Recent Labs  07/22/13 0023 07/22/13 0413 07/22/13 0803  GLUCAP 70 97 73    Scheduled Meds: . chlorhexidine  15 mL Mouth/Throat BID  .  ciprofloxacin  400 mg Intravenous Q12H  . enoxaparin (LOVENOX) injection  40 mg Subcutaneous Q24H  . free water  200 mL Per Tube 3 times per day  . levETIRAcetam  1,000 mg Intravenous Q12H  . metoCLOPramide (REGLAN) injection  5 mg Intravenous 3 times per day  . metoprolol  2.5 mg Intravenous 4 times per day  . mirtazapine  30 mg Oral QHS  . pantoprazole sodium  40 mg Per Tube Q1200  . risperiDONE  0.5 mg Oral BID  . sodium chloride  10-40 mL Intracatheter Q12H  . valproate sodium  500 mg Intravenous 3 times per day    Continuous Infusions: . sodium chloride 100 mL/hr at 07/21/13 1045  . feeding supplement (VITAL AF 1.2 CAL) 1,000 mL (07/21/13 1045)    Past Medical History  Diagnosis Date  . Peripheral neuropathy   . Fibromyalgia   . Hypertension   . Anxiety   . Catatonia    . Seizures     Past Surgical History  Procedure Laterality Date  . Gastric bypass    . Abdominal hysterectomy    . Jejunostomy N/A 06/25/2013    Procedure:  OPEN JEJUNOSTOMY FEEDING TUBE ;  Surgeon: Cherylynn RidgesJames O [name], MD;  [facility];  Service: General;  Laterality: N/A;  . Laparotomy N/A 07/02/2013    Procedure: EXPLORATORY LAPAROTOMY with revision of jejunostomy feeding tube.;  Surgeon: Cherylynn RidgesJames O [name], MD;  [facility];  Service: General;  Laterality: N/A;  . Bowel resection N/A 07/02/2013    Procedure: SMALL BOWEL RESECTION;  Surgeon: Cherylynn RidgesJames O [name], MD;  [facility];  Service: General;  Laterality: N/A;    Ian Malkineanne Barnett RD, LDN Inpatient Clinical Dietitian Pager: [phone] After Hours Pager: [phone]

## 2013-07-22 NOTE — Care Management Note (Signed)
    Page 1 of 1   07/22/2013     1:45:40 PM   CARE MANAGEMENT NOTE 07/22/2013  Patient:  Joan [name],Joan [name]   Account Number:  [id number]  Date Initiated:  07/22/2013  Documentation initiated by:  Oletta CohnWOOD,Jora Galluzzo  Subjective/Objective Assessment:   53 year old female with a history of seizure disorder, hypertension, peripheral neuropathy, meningoencephalitis of unclear etiology [facility] and dislodged J-tube     Action/Plan:   Gen Surg consulted//Social Work consult for different SNF placement   Anticipated DC Date:  07/24/2013   Anticipated DC Plan:  SKILLED NURSING FACILITY  In-house referral  Clinical Social Worker      DC Planning Services  CM consult      Choice offered to / List presented to:             Status of service:  Completed, signed off Medicare Important Message given?  YES (If response is "NO", the following Medicare IM given date fields will be blank) Date Medicare IM given:  07/22/2013 Date Additional Medicare IM given:    Discharge Disposition:    Per UR Regulation:    If discussed at Long Length of Stay Meetings, dates discussed:   07/22/2013    Comments:  07/22/13 [name], RN, BSN, NCM [phone] CSW spoke to patient's son- Joan [name] and bed offers provided as he does not with [facility].  [facility]  Medicare IM given.

## 2013-07-23 ENCOUNTER — Inpatient Hospital Stay (HOSPITAL_COMMUNITY): Payer: Medicare Other

## 2013-07-23 LAB — BASIC METABOLIC PANEL
BUN: 5 mg/dL — ABNORMAL LOW (ref 6–23)
CALCIUM: 8.2 mg/dL — AB (ref 8.4–10.5)
CO2: 28 mEq/L (ref 19–32)
Chloride: 105 mEq/L (ref 96–112)
Creatinine, Ser: 0.56 mg/dL (ref 0.50–1.10)
GFR calc Af Amer: >90 mL/min (ref >90)
GLUCOSE: 92 mg/dL (ref 70–99)
Potassium: 3.4 mEq/L — ABNORMAL LOW (ref 3.7–5.3)
Sodium: 144 mEq/L (ref 137–147)

## 2013-07-23 LAB — HEPATIC FUNCTION PANEL
ALT: 9 U/L (ref 0–35)
AST: 15 U/L (ref 0–37)
Albumin: 2.5 g/dL — ABNORMAL LOW (ref 3.5–5.2)
Alkaline Phosphatase: 79 U/L (ref 39–117)
BILIRUBIN TOTAL: 0.3 mg/dL (ref 0.3–1.2)
Bilirubin, Direct: <0.2 mg/dL (ref 0.0–0.3)
Total Protein: 4.9 g/dL — ABNORMAL LOW (ref 6.0–8.3)

## 2013-07-23 LAB — CULTURE, BLOOD (ROUTINE X 2)
CULTURE: NO GROWTH
Culture: NO GROWTH

## 2013-07-23 LAB — CBC
HCT: 30.4 % — ABNORMAL LOW (ref 36.0–46.0)
HEMOGLOBIN: 10.1 g/dL — AB (ref 12.0–15.0)
MCH: 32.1 pg (ref 26.0–34.0)
MCHC: 33.2 g/dL (ref 30.0–36.0)
MCV: 96.5 fL (ref 78.0–100.0)
Platelets: 167 10*3/uL (ref 150–400)
RBC: 3.15 MIL/uL — ABNORMAL LOW (ref 3.87–5.11)
RDW: 20.1 % — AB (ref 11.5–15.5)
WBC: 8 10*3/uL (ref 4.0–10.5)

## 2013-07-23 LAB — GLUCOSE, CAPILLARY
GLUCOSE-CAPILLARY: 93 mg/dL (ref 70–99)
GLUCOSE-CAPILLARY: 94 mg/dL (ref 70–99)
GLUCOSE-CAPILLARY: 98 mg/dL (ref 70–99)
Glucose-Capillary: 84 mg/dL (ref 70–99)
Glucose-Capillary: 85 mg/dL (ref 70–99)
Glucose-Capillary: 89 mg/dL (ref 70–99)

## 2013-07-23 LAB — VALPROIC ACID LEVEL: Valproic Acid Lvl: 47.9 ug/mL — ABNORMAL LOW (ref 50.0–100.0)

## 2013-07-23 LAB — MAGNESIUM: Magnesium: 1.9 mg/dL (ref 1.5–2.5)

## 2013-07-23 LAB — AMMONIA: Ammonia: 23 umol/L (ref 11–60)

## 2013-07-23 NOTE — Progress Notes (Signed)
Pt c/o of some abdominal pain, Tylenol given with some relieve, Pt still having some nausea and vomiting, nausea medication given. Pt assisted to be clean and bed cover changed. We'll continue with POC.

## 2013-07-23 NOTE — Progress Notes (Signed)
Pt has decreased LOC this am in comparison to mental status on previous day.  Pt is lethargic and requires physical stimulation to arouse.  Vitals taken.  Pt BP 124/104, 100% on RA, HR 119.  MD has been made aware and is at bedside to assess.  New orders given at this time. Nino Glowourtney Asmaa Tirpak RN

## 2013-07-23 NOTE — Progress Notes (Signed)
UR completed Myranda Pavone K. Torina Ey, RN, BSN, MSHL, CCM  07/23/2013 3:59 PM

## 2013-07-23 NOTE — Progress Notes (Signed)
Physical Therapy Treatment Patient Details Name: Pietra Zuluaga MRN: [id number] DOB: 12/30/1960 Today's Date: 07/23/2013 Time: 1400-1440 PT Time Calculation (min): 40 min  PT Assessment / Plan / Recommendation  History of Present Illness 53 year old female with a history of seizure disorder, hypertension, peripheral neuropathy, meningoencephalitis of unclear etiology presented from [facility] with tachycardia and dislodged J-tube. The patient was recently discharged from the hospital after a prolonged stay from 06/19/2013 through 07/16/2013 due to bowel obstruction with J-tube revision, bacteremia, UTI, meningoencephalitis, and protein calorie malnutrition. The patient was discharged to her skilled nursing facility with instructions for 10 additional days of ciprofloxacin through her PICC line   PT Comments   Pt incontinent of stools upon arrival, pt unaware however after PT brought it to her attention pt thought it was vomit. After clean up pt able to reach sitting EOB with max assist, yelled out due to fear of falling and demonstrated strong posterior lean initially. Pt reports she does not feel much of her body and does not know where it is (when sitting up). Tremors worsen with volitional movement UE>LE. Slowly progressing towards goals, will continue to follow.  Follow Up Recommendations  SNF           Equipment Recommendations  None recommended by PT       Frequency Min 2X/week   Progress towards PT Goals Progress towards PT goals: Progressing toward goals  Plan Current plan remains appropriate    Precautions / Restrictions Precautions Precautions: Fall Precaution Comments: Confusion, significant weakness, poor motor control Required Braces or Orthoses: Other Brace/Splint Other Brace/Splint: abdominal binder Restrictions Weight Bearing Restrictions: No   Pertinent Vitals/Pain Abdominal binder region - repositioned binder, RN in room and aware    Mobility  Bed Mobility Overal  bed mobility: Needs Assistance Bed Mobility: Rolling;Supine to Sit;Sit to Supine Rolling: Max assist;+2 for physical assistance Supine to sit: Max assist Sit to supine: +2 for physical assistance General bed mobility comments: max assist for rolling, PT facilitating appropriate UE/LE mechanics and placement cues for "leading" with her head to assist with rolling. Pt yelling out in fear immediately upon sitting  - fearful of falling and with significant posterior lean.  Transfers General transfer comment: Unable to attempt transfer due to posterior lean in sitting, pt has great potential to slide out of chair due to relative extension and confusion.  Ambulation/Gait Ambulation/Gait assistance:  (unable to safely perform)     PT Goals (current goals can now be found in the care plan section) Acute Rehab PT Goals Time For Goal Achievement: 08/02/13 Potential to Achieve Goals: Fair  Visit Information  Last PT Received On: 07/23/13 Assistance Needed: +2 History of Present Illness: 53 year old female with a history of seizure disorder, hypertension, peripheral neuropathy, meningoencephalitis of unclear etiology presented from [facility] with tachycardia and dislodged J-tube. The patient was recently discharged from the hospital after a prolonged stay from 06/19/2013 through 07/16/2013 due to bowel obstruction with J-tube revision, bacteremia, UTI, meningoencephalitis, and protein calorie malnutrition. The patient was discharged to her skilled nursing facility with instructions for 10 additional days of ciprofloxacin through her PICC line    Subjective Data   "I don't know if I can sit up."   Cognition  Cognition Arousal/Alertness: Awake/alert Behavior During Therapy: Flat affect Overall Cognitive Status: No family/caregiver present to determine baseline cognitive functioning Area of Impairment: Orientation;Safety/judgement;Following commands;Awareness;Problem solving Orientation Level:  Time;Situation;Place;Disoriented to Memory: Decreased short-term memory Following Commands: Follows one step commands with increased time Safety/Judgement: Decreased  awareness of safety;Decreased awareness of deficits Problem Solving: Slow processing;Decreased initiation;Requires verbal cues;Requires tactile cues;Difficulty sequencing    Balance  Balance Overall balance assessment: Needs assistance Sitting-balance support: Bilateral upper extremity supported;Feet supported Sitting balance-Leahy Scale: Poor Sitting balance - Comments: Pt sat EOB ~10 min initially with significant posteiror lean requiring max assist progressing to min/mod assist after dynamic balance and reaching tasks. Pt did well bringing UE down to elbow on bed increasing somatosensory input. Pt states she can not feel her feet on the floor, decreased sensation through trunk "I don't know where I am."  Postural control: Posterior lean  End of Session PT - End of Session Activity Tolerance: Other (comment) (limited by fear and cognition) Patient left: in bed;with nursing/sitter in room Nurse Communication: Mobility status;Need for lift equipment   GP     Wilhemina BonitoGillispie, Atoya Andrew Cheek 07/23/2013, 3:21 PM

## 2013-07-23 NOTE — Progress Notes (Signed)
[facility] PROGRESS NOTE  Joan [name] ZOX:[id number] DOB: [birth year] DOA: 07/17/2013 PCP: [name], MD  Interim history 53 year old female with a history of seizure disorder, hypertension, peripheral neuropathy, meningoencephalitis of unclear etiology presented from [facility] with tachycardia and dislodged J-tube. The patient was recently discharged from the hospital after a prolonged stay from 06/19/2013 through 07/16/2013 due to bowel obstruction with J-tube revision, bacteremia, UTI, autoimmune meningoencephalitis, and protein calorie malnutrition. The patient returns from  SNF due to  tachycardia and dislodged J-tube and confusion. The patient's mentation returned to baseline. The J-tube has been resecured, and multiple radiographic studies have verified proper placement. During the hospitalization, the patient has had difficulty tolerating her tube feeds resulting in vomiting. Surgery was reconsulted and did not feel that the patient had obstruction. He stated to these can continue despite vomiting. Reglan was started which seemed to help. Tube feeds restarted at a slower rate and currently being slowly titrated up to goal rate of 60 cc per hour.  On the morning of 07/23/13, the patient was more encephalopathic and somnolent which may be related to medications. See below.  As to finish IV Ciprosessment/Plan: Acute encephalopathy  -The patient's mentation significantly improved from the time of admission -07/23/13 AM very somnolent but aroused with voice--?meds vs metabolic cause --->check UA, VPA level, hepatic panel, CT brain --->d/c all sedative meds--remeron, risperdal for now --->??related to increase reglan dose--dose increased to 10mg  q8 on 07/22/13--hold reglan fornow  -CBG=84 -Hx Autoimmune encephalitis initially managed by neurology on the last admission, who had signed off on 2/3. Pt received diatech catheter on 06/20/2013 placement and received and finished  plasmapheresis--may need reconsult if no improvement -speech to eval swallowing--dysphagia 1 diet with thin liquids  -Check serum B12--667  - RBC folate--693  - RPR--NR  -ammonia--17  -TSH--3.282 -previous UAs/urine cultures negative -Updated information revealed that the patient did not receive any medications during her short stay [name]--question seizure with postictal state as reason for patient's acute encephalopathy  -The patient has outpatient neurology appointment on 08/23/2013 which she should keep  Sinus tachycardia/SIRS  -Tachycardia improving with the restart of metoprolol -increase metoprolol tartrate to 75mg  bid  -According to the medical record this has been a chronic problem for the last several weeks which showed some mild improvement after beta blocker was started on the last admission  -This is likely due to her acute medical illness  -Right upper extremity duplex negative for DVT  -appreciate Dr. Andrey Campanile, general surgery assistance  -abdominal binder at all times  -Continue ciprofloxacin IV--plan to continue until 07/26/2013 if repeat blood cultures are negative--this will signify 14 days of antibiotic therapy from the last negative culture  -Blood cultures--negative  -duplex RUE to r/o DVT  -CT angiogram chest is negative for pulmonary embolus  -Urinalysis negative for pyuria  Recurrent vomiting/TF intolerance/Gastroparesis  -ABD xray shows few scatter fluid levels--?ileus  -pt has had BMs  -CT abdomen--negative for bowel obstruction. Chronic proximal jejunal dilatation. Small bilateral pleural effusions  -check VPA level--28.5  - lipase--15  -appreciate surgical evaluation-->restart TF  -Restart enteral feeds at 30 cc per hour--now at 40cc/hr-->slowly increase to target rate of 60c/hr -may need to restart reglan when mental status improves  Hx of Convulsions/Seizures  -Pt has a PMH of seizures and catatonia.  - 06/20/13 MRI of the brain showed resolution  of thalamic signal abnormality  -continue current AEDs  Hx of Anxiety  -Continue pt on Remeron--stopped 07/23/13 due to AMS  -Patient appears to have  had a psychiatric history  -Appears to be stable at this time  -Psych prescribed Risperdal 0.5 mg bid previous admission--stopped on 07/23/13 due to AMS  HTN  -Increase metoprolol to 75 mg twice a day  Malnutrition with history of Gastric Bypass/partial gastrectomy  - encourage PO intake with TF  -J-tube resutured and stable  -Gen Surg consulted, J-tube placed and underwent revision of j tube with exploratory laparotomy on 2/3 and small bowel resection.  -She is able to take a dysphagia 1 diet by mouth which I encouraged the patient to do  Bacteremia  -On her previous admission, the patient had Klebsiella pneumoniae bacteremia for which she was discharged on ciprofloxacin  -She was continued on ciprofloxacin throughout her hospitalization  -Continue ciprofloxacin through 07/26/2013  -Repeat blood cultures have remained negative.  Thrombocytopenia  -No acute bleeding monitor  -chronic  Pleural effusions  -Currently no respiratory distress  -Oxygen saturation 97-100% on room air  -Remains stable when compared to prior CT angiogram of the chest on 07/11/2013  -repeat CXR 07/19/13 shows improvement of effusions  Loose Stools  -Patient had large volume yellow loose stool in the ED  -C. difficile PCR negative  Family Communication: updated son on phone Disposition Plan: SNF when stable       Procedures/Studies: Ct Abdomen Pelvis Wo Contrast  07/10/2013   CLINICAL DATA:  Sepsis question intra-abdominal source  EXAM: CT ABDOMEN AND PELVIS WITHOUT CONTRAST  TECHNIQUE: Multidetector CT imaging of the abdomen and pelvis was performed following the standard protocol without intravenous contrast. Sagittal and coronal MPR images reconstructed from axial data set. GI contrast administered  COMPARISON:  CT pelvis 05/17/2013; CT abdomen and pelvis  06/02/2011  FINDINGS: Small moderate-sized right pleural effusion with basilar atelectasis.  Minimal atelectasis left base.  Gallbladder surgically absent.  Prior gastric surgery.  Jejunostomy tube left mid abdomen, through which GI contrast was administered.  Dilated small bowel loops in the upper abdomen terminating at an anastomotic staple line in the left mid abdomen most compatible with small bowel obstruction ; the lack of dilatation of the remaining small bowel and colon makes ileus much less likely.  Small bowel measures up to 4.8 cm transverse mild bowel wall thickening of the dilated loops.  Distal small bowel is decompressed and unremarkable.  Small foci of gas in the left mid abdomen into the upper left pelvis appear to be within several small bowel loops which are nondistended and no definite free intraperitoneal air is identified.  Decompressed colon containing contrast and a rectal tube.  Unremarkable bladder and ovaries with surgical absence of uterus.  Small amount of free pelvic fluid.  Diffuse soft tissue edema/ anasarca.  No mass, adenopathy, or hernia.  Osseous structures unremarkable.  IMPRESSION: Dilated proximal small bowel loops proximal to the jejunostomy site most consistent with small bowel obstruction, associated with mild bowel wall thickening and duodenal dilatation.  Decompressed distal small bowel and colon.  Small amount of nonspecific free pelvic fluid.   Electronically Signed   By: Ulyses Southward M.D.   On: 07/10/2013 17:38   Dg Abd 1 View  06/27/2013   CLINICAL DATA:  Nausea, abdominal pain, vomiting  EXAM: ABDOMEN - 1 VIEW  COMPARISON:  None.  FINDINGS: There is a surgical drain present in the mid abdomen. There are surgical stated in the right paramedian abdominal wall. There is a small amount of contrast scattered throughout the colon. There is no bowel dilatation to suggest obstruction. There is no evidence of  pneumoperitoneum, portal venous gas or pneumatosis. There are no  pathologic calcifications along the expected course of the ureters.The osseous structures are unremarkable.  IMPRESSION: Nonobstructive bowel gas pattern.   Electronically Signed   By: Elige Ko   On: 06/27/2013 09:34   Ct Angio Chest Pe W/cm &/or Wo Cm  07/17/2013   CLINICAL DATA:  Tachycardia.  Upper chest pain, shortness of breath.  EXAM: CT ANGIOGRAPHY CHEST WITH CONTRAST  TECHNIQUE: Multidetector CT imaging of the chest was performed using the standard protocol during bolus administration of intravenous contrast. Multiplanar CT image reconstructions and MIPs were obtained to evaluate the vascular anatomy.  CONTRAST:  70mL OMNIPAQUE IOHEXOL 350 MG/ML SOLN  COMPARISON:  DG CHEST 1V PORT dated 07/17/2013; CT ANGIO CHEST W/CM &/OR WO/CM dated 07/11/2013  FINDINGS: No filling defects in the pulmonary arteries to suggest pulmonary emboli. Heart is normal size. Aorta is normal caliber. No dissection.  Moderate left pleural effusion and large right pleural effusion. These are stable since prior study. Compressive atelectasis in the lower lobes bilaterally. No mediastinal, hilar or axillary adenopathy. Chest wall soft tissues are unremarkable. Imaging into the upper abdomen shows no acute findings.  Review of the MIP images confirms the above findings.  IMPRESSION: No evidence of pulmonary embolus.  Continued bilateral effusions, right greater than left, unchanged. Compressive atelectasis in the lower lobes.  No change since prior study.   Electronically Signed   By: Charlett Nose M.D.   On: 07/17/2013 06:48   Ct Angio Chest Pe W/cm &/or Wo Cm  07/11/2013   CLINICAL DATA:  Altered mental status, tachycardia, chest pain and shortness breath  EXAM: CT ANGIOGRAPHY CHEST WITH CONTRAST  TECHNIQUE: Multidetector CT imaging of the chest was performed using the standard protocol during bolus administration of intravenous contrast. Multiplanar CT image reconstructions and MIPs were obtained to evaluate the vascular  anatomy.  CONTRAST:  OMNIPAQUE IOHEXOL 350 MG/ML SOLN  COMPARISON:  DG CHEST 1V PORT dated 07/11/2013; CT ABD/PELV WO CM dated 07/10/2013; CT CHEST W/CM dated 06/21/2013  FINDINGS: There are no filling defects within the pulmonary arteries to suggest acute pulmonary embolism. Acute findings of the aorta or great vessels. No pericardial fluid.  There are bilateral pleural effusions, moderate to large on the right and small on the left. There is associated right lower lobe passive atelectasis.  Limited view of the upper abdomen is unremarkable. Limited view of the skeleton demonstrates degenerative spurring.  Review of the MIP images confirms the above findings.  IMPRESSION: 1. No evidence acute pulmonary embolism. 2. Moderate to large right pleural effusion with associated passive atelectasis. 3. No pericardial fluid.   Electronically Signed   By: Genevive Bi M.D.   On: 07/11/2013 17:53   Ct Abdomen Pelvis W Contrast  07/21/2013   CLINICAL DATA:  Tachycardia and dislodged J-tube. Abdominal pain and vomiting.  EXAM: CT ABDOMEN AND PELVIS WITH CONTRAST  TECHNIQUE: Multidetector CT imaging of the abdomen and pelvis was performed using the standard protocol following bolus administration of intravenous contrast.  CONTRAST:  OMNIPAQUE IOHEXOL 300 MG/ML  SOLN  COMPARISON:  DG ABD 2 VIEWS dated 07/19/2013; CT ABD/PELV WO CM dated 07/10/2013  FINDINGS: Small bilateral pleural effusions are seen, right greater than left, with associated bibasilar airspace opacification likely reflecting atelectasis.  The liver and spleen are unremarkable in appearance. The patient is status post cholecystectomy, with clips noted along the gallbladder fossa. The pancreas and adrenal glands are unremarkable.  The kidneys are  unremarkable in appearance. There is no evidence of hydronephrosis. No renal or ureteral stones are seen. Minimal nonspecific perinephric stranding is noted on the left side.  A replacement J-tube is seen  ending at the jejunum at the left lower quadrant. A bowel suture line is noted along the jejunum, with distention of more proximal jejunum to 4.7 cm in diameter. There is no definite evidence of bowel obstruction; contrast progresses to the level of the rectum.  No free fluid is identified. The small bowel is unremarkable in appearance. Postoperative change is noted about the stomach. No acute vascular abnormalities are seen. Postoperative change is seen along the midline anterior abdominal wall.  The appendix is normal in caliber and filled with contrast, without evidence for appendicitis. The colon is largely decompressed and unremarkable in appearance.  The bladder is mildly distended and grossly unremarkable. The patient is status post hysterectomy. The ovaries are relatively symmetric; no suspicious adnexal masses are seen. No inguinal lymphadenopathy is seen.  Mild diffuse soft tissue edema is seen along the abdominal wall and proximal lower extremities, raising question for mild anasarca.  No acute osseous abnormalities are identified.  IMPRESSION: 1. Replacement J-tube seen ending at the jejunum at the left lower quadrant. Likely chronic distention of more proximal jejunum to 4.7 cm in diameter, with associated bowel suture line, but no evidence of bowel obstruction. Ingested contrast progresses to the level of the rectum. 2. Small bilateral pleural effusions, right greater than left, with associated bibasilar airspace opacification likely reflecting atelectasis. 3. Mild diffuse soft tissue edema along the abdominal wall and proximal lower extremities, raising question for mild anasarca.   Electronically Signed   By: Roanna RaiderJeffery  Chang M.D.   On: 07/21/2013 05:55   Ir Removal Tun Cv Cath W/o Fl  07/11/2013   CLINICAL DATA:  Sepsis, concern for right internal jugular tunneled central catheter infection, finished plasmapheresis treatment, request for removal.  EXAM: REMOVAL OF TUNNELED CENTRAL VENOUS CATHETER   PROCEDURE: Risks and benefits of procedure discussed with the patient's son over the phone, verbal consent was obtained.  The right chest tunneled central catheter site was prepped with chlorhexidine. A sterile gown and gloves were worn during the procedure.  Utilizing manual manipulation, the subcutaneous cuff of the central catheter was freed. The catheter was then successfully removed in its entirety. A sterile dressing was applied over the catheter exit site.  IMPRESSION: Removal of tunneled right internal jugular central catheter utilizing manual manipulation. The procedure was uncomplicated.  Read By:  Pattricia BossKoreen Morgan PA-C   Electronically Signed   By: Malachy MoanHeath  McCullough M.D.   On: 07/11/2013 11:39   Ir Replc Duoden/jejuno Tube Percut W/fluoro  07/13/2013   CLINICAL DATA:  Occluded surgical jejunostomy requiring exchange.  EXAM: JEJUNOSTOMY TUBE EXCHANGE  CONTRAST:  10 ml Omnipaque-300  FLUOROSCOPY TIME:  1 min and 24 seconds.  PROCEDURE: Informed consent was obtained by telephone from the patient's son.  The pre-existing jejunostomy and surrounding skin were prepped with Betadine. Attempt was made to inject contrast via the pre-existing tube. The tube was cut and removed over a hydrophilic guidewire. A new 12 French red rubber jejunostomy catheter was advanced over the wire.  Final catheter position was confirmed with a fluoroscopic spot image obtained after injection of contrast. The catheter was secured at the exit site with a silk retention suture.  COMPLICATIONS: None.  FINDINGS: The pre-existing 6910 French tube is occluded. A new 12 French tube was placed and advanced into the jejunum.  IMPRESSION: Replacement of occluded 10 French jejunostomy catheter. The tube was up sized to a 12 [place] catheter advanced into the jejunum over a guidewire.   Electronically Signed   By: Irish Lack M.D.   On: 07/13/2013 15:54   Dg Chest Port 1 View  07/20/2013   CLINICAL DATA:  Possible aspiration.  EXAM: PORTABLE  CHEST - 1 VIEW  COMPARISON:  Chest radiograph and CTA of the chest performed 07/17/2013  FINDINGS: The patient's right PICC is noted ending about the distal SVC.  The previously noted bilateral pleural effusions have improved. Bibasilar airspace opacity likely reflects residual atelectasis, without definite evidence of aspiration. No pneumothorax is seen.  The cardiomediastinal silhouette is borderline normal in size. No acute osseous abnormalities are identified.  IMPRESSION: Previously noted bilateral pleural effusions have improved. Bibasilar airspace opacity likely reflects residual atelectasis, without definite evidence of aspiration.   Electronically Signed   By: Roanna Raider M.D.   On: 07/20/2013 03:35   Dg Chest Portable 1 View  07/17/2013   CLINICAL DATA:  Abdominal pain, tachycardia.  EXAM: PORTABLE CHEST - 1 VIEW  COMPARISON:  CT ANGIO CHEST W/CM &/OR WO/CM dated 07/11/2013; DG CHEST 1V PORT dated 07/11/2013  FINDINGS: Moderate right pleural effusion, likely slightly increased since prior study. Interval removal of right hemodialysis catheter. Right PICC line is unchanged. Patchy right lung airspace disease and left basilar airspace disease medially, both slightly increased since prior study. No acute bony abnormality.  IMPRESSION: Increasing patchy bilateral airspace disease as above. Increasing moderate right effusion.   Electronically Signed   By: Charlett Nose M.D.   On: 07/17/2013 03:54   Dg Chest Port 1 View  07/11/2013   CLINICAL DATA:  Shortness of breath, evaluate pulmonary infiltrates  EXAM: PORTABLE CHEST - 1 VIEW  COMPARISON:  DG CHEST 1V PORT dated 07/10/2013; DG CHEST 1V PORT dated 07/03/2013; CT CHEST W/CM dated 06/21/2013; IR FLUORO GUIDE CV LINE*R* dated 06/20/2013; DG CHEST 1V PORT dated 05/11/2013  FINDINGS: Grossly unchanged cardiac silhouette and mediastinal contours given patient rotation. Stable position of support apparatus. Grossly unchanged right mid lung heterogeneous airspace  opacities with worsening bilateral medial basilar airspace opacities. No definite pleural effusion or pneumothorax. No evidence of edema. A minimal amount of enteric contrast is seen within in the gastric fundus. Post cholecystectomy. Grossly unchanged bones.  IMPRESSION: 1.  Stable positioning of support apparatus.  No pneumothorax. 2. Worsening bibasilar airspace disease, possibly atelectasis though progression of multifocal infection / aspiration is not excluded. A follow-up chest radiograph in 4 to 6 weeks after treatment is recommended to ensure resolution.   Electronically Signed   By: Simonne Come M.D.   On: 07/11/2013 07:47   Dg Chest Port 1 View  07/10/2013   CLINICAL DATA:  Shortness of breath.  Hypotension  EXAM: PORTABLE CHEST - 1 VIEW  COMPARISON:  DG CHEST 1V PORT dated 07/03/2013; CT CHEST W/CM dated 06/21/2013  FINDINGS: Right internal jugular dialysis catheter tip: Right atrium. Thoracic spondylosis noted. There is a band of airspace opacity in the right upper lobe just above the minor fissure. Improved aeration at the right lung base.  IMPRESSION: Improved aeration at the right lung base, with but there is a band of airspace opacity in the right upper lobe favoring atelectasis over pneumonia. Next item thoracic spondylosis.   Electronically Signed   By: Herbie Baltimore M.D.   On: 07/10/2013 08:27   Dg Chest Port 1 View  07/03/2013   CLINICAL DATA:  Tubes and lines in good position, detailed above.  EXAM: PORTABLE CHEST - 1 VIEW  COMPARISON:  Chest CT 06/21/2013  FINDINGS: There is nasogastric tube which terminates near the pylorus. Right IJ catheter, tip in the upper right atrium. Right upper extremity PICC, tip at the upper cavoatrial junction.  Oral contrast noted within the gastric fundus and small bowel.  Haziness of the right base, with volume loss. Node definitive consolidation.  No cardiomegaly.  IMPRESSION: 1. Negative for pneumonia. 2. Haziness of the right lower chest, likely pleural  fluid or atelectasis. 3. Tubes and lines in good position, detailed above.   Electronically Signed   By: Tiburcio Pea M.D.   On: 07/03/2013 03:35   Dg Abd 2 Views  07/19/2013   CLINICAL DATA:  Abdominal pain and vomiting, recent bowel resection.  EXAM: ABDOMEN - 2 VIEW  COMPARISON:  DG ABD PORTABLE 1V dated 07/17/2013  FINDINGS: Bowel gas pattern appears nondilated and nonobstructive, with an overall paucity of bowel gas. A few scattered air-fluid levels seen on the lateral decubitus views. No intraperitoneal free air.  Jejunostomy tube projects and left lower quadrant, no residual enteric contrast. Surgical staple line projects in left mid abdomen. Surgical clips in the right upper quadrant likely reflect cholecystectomy. Soft tissue planes and included osseous structures are nonsuspicious.  IMPRESSION: A few scattered air-fluid levels may reflect mild ileus, less likely or early bowel obstruction.  Jejunostomy tube in place without residual enteric contrast.   Electronically Signed   By: Awilda Metro   On: 07/19/2013 22:27   Dg Abd Portable 1v  07/17/2013   CLINICAL DATA:  J-tube placement.  EXAM: PORTABLE ABDOMEN - 1 VIEW  COMPARISON:  07/13/2013  FINDINGS: Contrast was injected through the jejunostomy tube. This opacifies small bowel loops in the upper pelvis. No evidence of contrast extravasation.  Slightly prominent bowel loops or superiorly in the abdomen, nonspecific. Prior cholecystectomy. No free air.  IMPRESSION: Jejunostomy tube within upper pelvic small bowel loops. Mildly prominent abdominal small bowel loops which could reflect mild focal ileus although early or partial small bowel obstruction cannot be excluded.   Electronically Signed   By: Charlett Nose M.D.   On: 07/17/2013 03:57   Dg Abd Portable 1v  07/12/2013   CLINICAL DATA:  Abdominal pain  EXAM: PORTABLE ABDOMEN - 1 VIEW  COMPARISON:  07/11/2013  FINDINGS: Postsurgical changes are again seen. A feeding catheter is noted.  Scattered large and small bowel gas is seen. No obstructive changes are noted. No definitive free air is seen.  IMPRESSION: No significant change from the prior exam.   Electronically Signed   By: Alcide Clever M.D.   On: 07/12/2013 07:44   Dg Abd Portable 1v  07/11/2013   CLINICAL DATA:  Abdominal distention, evaluate for small bowel obstruction  EXAM: PORTABLE ABDOMEN - 1 VIEW  COMPARISON:  CT ABD/PELV WO CM dated 07/10/2013; DG ABD PORTABLE 1V dated 07/10/2013; DG ABD PORTABLE 1V dated 06/30/2013  FINDINGS: There has been passage of the majority of previously administered enteric contrast. A minimal amount of enteric contrast remains within the colon. Paucity of bowel gas without definite evidence of obstruction. Nondiagnostic evaluation pneumoperitoneum secondary supine positioning exclusion of lower thorax. No definite pneumatosis or portal venous gas.  A feeding jejunostomy tube overlies the left mid/ lower abdomen.  A vertically aligned skin staples overlie the right mid abdomen. Post cholecystectomy.  Unchanged bones.  IMPRESSION: Paucity of bowel gas, however there has been apparent passage  of previously ingested enteric contrast. No definite evidence of obstruction.   Electronically Signed   By: Simonne Come M.D.   On: 07/11/2013 08:26   Dg Abd Portable 1v  07/10/2013   CLINICAL DATA:  Assess for ileus  EXAM: PORTABLE ABDOMEN - 1 VIEW  COMPARISON:  June 30, 2013  FINDINGS: There is persistent air-filled dilated small bowel loops in the left abdomen. Air is noted in the colon. Skin staples are projected over the right abdomen. Patient is status post prior cholecystectomy.  IMPRESSION: Persistent ileus.   Electronically Signed   By: Sherian Rein M.D.   On: 07/10/2013 02:30   Dg Abd Portable 1v  06/30/2013   CLINICAL DATA:  NG tube placement.  EXAM: PORTABLE ABDOMEN - 1 VIEW  COMPARISON:  Single view of the abdomen 06/27/2013. Upper GI/small bowel follow-through 06/28/2013  FINDINGS: NG tube is in place  in good position with the tip in this distal stomach. Contrast material in small bowel from the comparison upper GI series/small bowel follow-through. Small bowel loops are dilated. Only a small amount of loculated barium is seen in the colon.  IMPRESSION: NG tube in good position.  Small bowel obstruction.   Electronically Signed   By: Drusilla Kanner M.D.   On: 06/30/2013 19:01   Dg Ugi W/small Bowel  06/28/2013   CLINICAL DATA:  53 year old female postop 2 days since percutaneous jejunostomy tube placement. Drainage from NG tube. Abdominal pain. Initial encounter.  EXAM: UPPER GI SERIES WITH SMALL BOWEL FOLLOW-THROUGH  FLUOROSCOPIC GUIDED NG TUBE PLACEMENT  TECHNIQUE: Combined double contrast and single contrast upper GI series using effervescent crystals, thick barium, and thin barium. Subsequently, serial images of the small bowel were obtained including spot views of the terminal ileum.  COMPARISON:  [facility] 06/27/2013 and earlier.  FLUOROSCOPY TIME:  9 min and 18 seconds.  FINDINGS: Preprocedural scout view of the abdomen demonstrates a small volume of loculated barium in the colon. This is related to the 06/17/2013 NG tube placement/ confirmation study.  Scout view demonstrates the recently placed right an mid lower abdomen percutaneous jejunostomy. Right lower abdomen skin staples in place.  The scout view also demonstrates the patient's NG tube is looped back up into the esophagus. This was evaluated in real-time with fluoroscopy, and a wire was placed through the tube in an effort to maneuver the tip back into the stomach. However, this was unsuccessful an the tube had be retracted into the nasopharynx. Using fluoroscopic guidance, the tube was then advanced with a wire into the stomach, such that the tip and side hole are within the stomach.  Given the recent abdominal surgery, initially water-soluble contrast was planned, however prominent gastroesophageal reflux of contrast was noted early on, and the  decision was made to switch contrast to thin barium. Water-soluble contrast was fully aspirated from the stomach. Subsequently, barium contrast was slowly administered.  The stomach appears small. No gastrojejunostomy is evident. After a short delay, barium emptied the stomach into the proximal duodenum. The second portion of the duodenum is dilated.  After 20 additional min significantly dilated proximal jejunum is opacified, with the small bowel caliber up to 58 mm.  At this point study was discussed with Dr. Jimmye Norman . We decided to inject the percutaneous jejunostomy tube, and give the barium more time to opacified a small bowel in hopes of loose to dating the small bowel obstruction transition point.  Barium injection through the jejunostomy tube demonstrates normal small bowel loops  at the jejunostomy site. This barium was then aspirated.  Subsequently a 3 hr delayed image was obtained demonstrating a gradual tapering of small bowel loops into the right lower abdomen, heading toward the vicinity of the jejunostomy. These smaller loops measure 23 mm diameter (arrow).  At this point, Dr. Lindie Spruce advised that no additional imaging was needed.  IMPRESSION: 1. High-grade small bowel obstruction. Dilated duodenum and proximal jejunum up to 58 mm diameter. 2. Small bowel loops slowly taper to the right lower abdomen in the vicinity of the percutaneous jejunostomy. 3. The jejunostomy was injected with contrast, and is situated within normal decompressed small bowel. 4. Diminutive stomach. No gastrojejunostomy is evident. Prominent gastroesophageal reflux. 5. At the beginning of the exam the patient's NG tube was malpositioned. This was withdrawn to the nasopharynx and repositioned into the stomach under fluoroscopic guidance.   Electronically Signed   By: Augusto Gamble M.D.   On: 06/28/2013 16:29         Subjective:   Objective: Filed Vitals:   07/22/13 1510 07/22/13 2047 07/23/13 0154 07/23/13 0526  BP:  136/72 118/58  124/53  Pulse: 130 102  108  Temp: 98.6 F (37 C) 98.8 F (37.1 C)  98.1 F (36.7 C)  TempSrc: Oral Oral  Oral  Resp: 20 20  20   Weight:   76.431 kg (168 lb 8 oz)   SpO2: 100% 95%  100%    Intake/Output Summary (Last 24 hours) at 07/23/13 0948 Last data filed at 07/23/13 0914  Gross per 24 hour  Intake 5220.01 ml  Output      1 ml  Net 5219.01 ml   Weight change: -1.769 kg (-3 lb 14.4 oz) Exam:   General:  Pt is alert, follows commands appropriately, not in acute distress  HEENT: No icterus, No thrush, No neck mass, [place]/AT  Cardiovascular: RRR, S1/S2, no rubs, no gallops  Respiratory: CTA bilaterally, no wheezing, no crackles, no rhonchi  Abdomen: Soft/+BS, non tender, non distended, no guarding  Extremities: No edema, No lymphangitis, No petechiae, No rashes, no synovitis  Data Reviewed: Basic Metabolic Panel:  Recent Labs Lab 07/19/13 0455 07/20/13 0510 07/21/13 0500 07/22/13 0500 07/23/13 0415  NA 143 141 138 141 144  K 4.4 4.5 3.9 3.5* 3.4*  CL 107 105 104 105 105  CO2 25 28 27 26 28   GLUCOSE 93 85 85 80 92  BUN 3* 5* 6 5* 5*  CREATININE 0.53 0.53 0.61 0.55 0.56  CALCIUM 8.0* 8.1* 7.7* 8.1* 8.2*  MG  --   --   --   --  1.9   Liver Function Tests:  Recent Labs Lab 07/17/13 0335 07/20/13 0510 07/21/13 0500  AST 21 22 22   ALT 10 10 11   ALKPHOS 68 78 76  BILITOT 0.5 0.3 0.3  PROT 4.9* 4.8* 4.5*  ALBUMIN 2.3* 2.2* 2.0*    Recent Labs Lab 07/20/13 0510  LIPASE 15    Recent Labs Lab 07/17/13 1320  AMMONIA 17   CBC:  Recent Labs Lab 07/17/13 0335  07/19/13 0455 07/20/13 0510 07/21/13 0500 07/22/13 0500 07/23/13 0825  WBC 8.9  < > 6.1 6.8 6.4 6.8 8.0  NEUTROABS 6.3  --   --   --   --   --   --   HGB 11.5*  < > 10.2* 10.0* 9.8* 9.7* 10.1*  HCT 34.1*  < > 31.2* 30.6* 29.4* 29.5* 30.4*  MCV 94.7  < > 96.6 95.9 96.4 96.1 96.5  PLT 130*  < > 137* 136* 146* 151 167  < > = values in this interval not  displayed. Cardiac Enzymes: No results found for this basename: CKTOTAL, CKMB, CKMBINDEX, TROPONINI,  in the last 168 hours BNP: No components found with this basename: POCBNP,  CBG:  Recent Labs Lab 07/22/13 1133 07/22/13 1612 07/22/13 2009 07/23/13 0014 07/23/13 0435  GLUCAP 79 81 92 94 93    Recent Results (from the past 240 hour(s))  CULTURE, BLOOD (ROUTINE X 2)     Status: None   Collection Time    07/17/13  3:35 AM      Result Value Ref Range Status   Specimen Description BLOOD RIGHT PICC LINE   Final   Special Requests BOTTLES DRAWN AEROBIC AND ANAEROBIC 10CC   Final   Culture  Setup Time     Final   Value: 07/17/2013 08:11     Performed at [facility]   Culture     Final   Value: NO GROWTH 5 DAYS     Performed at [facility]   Report Status 07/23/2013 FINAL   Final  CULTURE, BLOOD (ROUTINE X 2)     Status: None   Collection Time    07/17/13  3:52 AM      Result Value Ref Range Status   Specimen Description BLOOD LEFT ARM   Final   Special Requests BOTTLES DRAWN AEROBIC ONLY 10CC   Final   Culture  Setup Time     Final   Value: 07/17/2013 08:11     Performed at [facility]   Culture     Final   Value: NO GROWTH 5 DAYS     Performed at [facility]   Report Status 07/23/2013 FINAL   Final  URINE CULTURE     Status: None   Collection Time    07/17/13  4:27 AM      Result Value Ref Range Status   Specimen Description URINE, CATHETERIZED   Final   Special Requests Normal   Final   Culture  Setup Time     Final   Value: 07/16/2013 03:22     Performed at [facility] Count     Final   Value: NO GROWTH     Performed at [facility]   Culture     Final   Value: NO GROWTH     Performed at [facility]   Report Status 07/18/2013 FINAL   Final  CLOSTRIDIUM DIFFICILE BY PCR     Status: None   Collection Time    07/17/13 10:30 AM      Result Value Ref Range Status   C difficile by pcr  NEGATIVE  NEGATIVE Final  URINE CULTURE     Status: None   Collection Time    07/20/13  2:15 AM      Result Value Ref Range Status   Specimen Description URINE, CATHETERIZED   Final   Special Requests Cipro Immunocompromised   Final   Culture  Setup Time     Final   Value: 07/20/2013 12:13     Performed at [facility] Count     Final   Value: NO GROWTH     Performed at [facility]   Culture     Final   Value: NO GROWTH     Performed at [facility]   Report Status 07/21/2013 FINAL  Final     Scheduled Meds: . chlorhexidine  15 mL Mouth/Throat BID  . ciprofloxacin  400 mg Intravenous Q12H  . enoxaparin (LOVENOX) injection  40 mg Subcutaneous Q24H  . free water  200 mL Per Tube 3 times per day  . levETIRAcetam  1,000 mg Oral BID  . metoprolol tartrate  75 mg Oral BID  . pantoprazole sodium  40 mg Per Tube Q1200  . sodium chloride  10-40 mL Intracatheter Q12H  . Valproic Acid  500 mg Oral 3 times per day   Continuous Infusions: . sodium chloride 100 mL/hr at 07/21/13 1045  . feeding supplement (VITAL AF 1.2 CAL) 1,000 mL (07/22/13 1455)     Dyrell Tuccillo, DO  [facility] Pager [phone]  If 7PM-7AM, please contact night-coverage [email or web address] Password [facility] 07/23/2013, 9:48 AM   LOS: 6 days

## 2013-07-24 ENCOUNTER — Inpatient Hospital Stay (HOSPITAL_COMMUNITY)
Admission: EM | Admit: 2013-07-24 | Discharge: 2013-07-30 | Disposition: A | Discharge status: Nursing Home (Includes ICF) | Payer: Medicare Other | Source: Home / Self Care | Attending: Internal Medicine | Admitting: Internal Medicine

## 2013-07-24 DIAGNOSIS — F411 Generalized anxiety disorder: Secondary | ICD-10-CM | POA: Diagnosis present

## 2013-07-24 DIAGNOSIS — R1110 Vomiting, unspecified: Secondary | ICD-10-CM

## 2013-07-24 DIAGNOSIS — J9600 Acute respiratory failure, unspecified whether with hypoxia or hypercapnia: Secondary | ICD-10-CM | POA: Diagnosis present

## 2013-07-24 DIAGNOSIS — G9340 Encephalopathy, unspecified: Secondary | ICD-10-CM | POA: Diagnosis present

## 2013-07-24 DIAGNOSIS — Z880 Allergy status to penicillin: Secondary | ICD-10-CM

## 2013-07-24 DIAGNOSIS — R000 Tachycardia, unspecified: Secondary | ICD-10-CM

## 2013-07-24 DIAGNOSIS — R197 Diarrhea, unspecified: Secondary | ICD-10-CM

## 2013-07-24 DIAGNOSIS — Z8249 Family history of ischemic heart disease and other diseases of the circulatory system: Secondary | ICD-10-CM

## 2013-07-24 DIAGNOSIS — N179 Acute kidney failure, unspecified: Secondary | ICD-10-CM | POA: Diagnosis present

## 2013-07-24 DIAGNOSIS — Z9049 Acquired absence of other specified parts of digestive tract: Secondary | ICD-10-CM

## 2013-07-24 DIAGNOSIS — F3289 Other specified depressive episodes: Secondary | ICD-10-CM | POA: Diagnosis present

## 2013-07-24 DIAGNOSIS — N390 Urinary tract infection, site not specified: Secondary | ICD-10-CM

## 2013-07-24 DIAGNOSIS — E43 Unspecified severe protein-calorie malnutrition: Secondary | ICD-10-CM | POA: Diagnosis present

## 2013-07-24 DIAGNOSIS — E870 Hyperosmolality and hypernatremia: Secondary | ICD-10-CM

## 2013-07-24 DIAGNOSIS — F329 Major depressive disorder, single episode, unspecified: Secondary | ICD-10-CM

## 2013-07-24 DIAGNOSIS — Z8661 Personal history of infections of the central nervous system: Secondary | ICD-10-CM

## 2013-07-24 DIAGNOSIS — R1310 Dysphagia, unspecified: Secondary | ICD-10-CM

## 2013-07-24 DIAGNOSIS — Z888 Allergy status to other drugs, medicaments and biological substances status: Secondary | ICD-10-CM

## 2013-07-24 DIAGNOSIS — E872 Acidosis, unspecified: Secondary | ICD-10-CM

## 2013-07-24 DIAGNOSIS — Z934 Other artificial openings of gastrointestinal tract status: Secondary | ICD-10-CM

## 2013-07-24 DIAGNOSIS — Z833 Family history of diabetes mellitus: Secondary | ICD-10-CM

## 2013-07-24 DIAGNOSIS — G40802 Other epilepsy, not intractable, without status epilepticus: Secondary | ICD-10-CM

## 2013-07-24 DIAGNOSIS — J90 Pleural effusion, not elsewhere classified: Secondary | ICD-10-CM | POA: Diagnosis present

## 2013-07-24 DIAGNOSIS — A419 Sepsis, unspecified organism: Secondary | ICD-10-CM

## 2013-07-24 DIAGNOSIS — Z79899 Other long term (current) drug therapy: Secondary | ICD-10-CM

## 2013-07-24 DIAGNOSIS — G609 Hereditary and idiopathic neuropathy, unspecified: Secondary | ICD-10-CM

## 2013-07-24 DIAGNOSIS — Z903 Acquired absence of stomach [part of]: Secondary | ICD-10-CM

## 2013-07-24 DIAGNOSIS — R112 Nausea with vomiting, unspecified: Secondary | ICD-10-CM

## 2013-07-24 DIAGNOSIS — I10 Essential (primary) hypertension: Secondary | ICD-10-CM

## 2013-07-24 DIAGNOSIS — R7881 Bacteremia: Secondary | ICD-10-CM

## 2013-07-24 DIAGNOSIS — D696 Thrombocytopenia, unspecified: Secondary | ICD-10-CM

## 2013-07-24 DIAGNOSIS — R259 Unspecified abnormal involuntary movements: Secondary | ICD-10-CM

## 2013-07-24 DIAGNOSIS — D649 Anemia, unspecified: Secondary | ICD-10-CM | POA: Diagnosis present

## 2013-07-24 DIAGNOSIS — B961 Klebsiella pneumoniae [K. pneumoniae] as the cause of diseases classified elsewhere: Secondary | ICD-10-CM | POA: Diagnosis present

## 2013-07-24 DIAGNOSIS — R569 Unspecified convulsions: Secondary | ICD-10-CM

## 2013-07-24 DIAGNOSIS — A048 Other specified bacterial intestinal infections: Secondary | ICD-10-CM

## 2013-07-24 DIAGNOSIS — Z885 Allergy status to narcotic agent status: Secondary | ICD-10-CM

## 2013-07-24 DIAGNOSIS — J189 Pneumonia, unspecified organism: Secondary | ICD-10-CM | POA: Diagnosis present

## 2013-07-24 DIAGNOSIS — Z9884 Bariatric surgery status: Secondary | ICD-10-CM

## 2013-07-24 DIAGNOSIS — I498 Other specified cardiac arrhythmias: Principal | ICD-10-CM

## 2013-07-24 DIAGNOSIS — IMO0001 Reserved for inherently not codable concepts without codable children: Secondary | ICD-10-CM

## 2013-07-24 DIAGNOSIS — K3184 Gastroparesis: Secondary | ICD-10-CM

## 2013-07-24 DIAGNOSIS — E860 Dehydration: Secondary | ICD-10-CM

## 2013-07-24 DIAGNOSIS — K9413 Enterostomy malfunction: Secondary | ICD-10-CM

## 2013-07-24 DIAGNOSIS — F29 Unspecified psychosis not due to a substance or known physiological condition: Secondary | ICD-10-CM

## 2013-07-24 DIAGNOSIS — R6510 Systemic inflammatory response syndrome (SIRS) of non-infectious origin without acute organ dysfunction: Secondary | ICD-10-CM

## 2013-07-24 DIAGNOSIS — E876 Hypokalemia: Secondary | ICD-10-CM | POA: Diagnosis not present

## 2013-07-24 DIAGNOSIS — R29818 Other symptoms and signs involving the nervous system: Secondary | ICD-10-CM

## 2013-07-24 DIAGNOSIS — G92 Toxic encephalopathy: Secondary | ICD-10-CM

## 2013-07-24 DIAGNOSIS — G928 Other toxic encephalopathy: Secondary | ICD-10-CM

## 2013-07-24 LAB — CBC WITH DIFFERENTIAL/PLATELET
BASOS PCT: 0 % (ref 0–1)
Basophils Absolute: 0 10*3/uL (ref 0.0–0.1)
Eosinophils Absolute: 0 10*3/uL (ref 0.0–0.7)
Eosinophils Relative: 0 % (ref 0–5)
HEMATOCRIT: 34.4 % — AB (ref 36.0–46.0)
HEMOGLOBIN: 11.3 g/dL — AB (ref 12.0–15.0)
LYMPHS ABS: 1.6 10*3/uL (ref 0.7–4.0)
Lymphocytes Relative: 21 % (ref 12–46)
MCH: 31.9 pg (ref 26.0–34.0)
MCHC: 32.8 g/dL (ref 30.0–36.0)
MCV: 97.2 fL (ref 78.0–100.0)
MONOS PCT: 10 % (ref 3–12)
Monocytes Absolute: 0.7 10*3/uL (ref 0.1–1.0)
NEUTROS ABS: 5.1 10*3/uL (ref 1.7–7.7)
Neutrophils Relative %: 69 % (ref 43–77)
Platelets: 176 10*3/uL (ref 150–400)
RBC: 3.54 MIL/uL — ABNORMAL LOW (ref 3.87–5.11)
RDW: 19.7 % — ABNORMAL HIGH (ref 11.5–15.5)
WBC: 7.5 10*3/uL (ref 4.0–10.5)

## 2013-07-24 LAB — URINALYSIS, ROUTINE W REFLEX MICROSCOPIC
Bilirubin Urine: NEGATIVE
Glucose, UA: NEGATIVE mg/dL
Hgb urine dipstick: NEGATIVE
Ketones, ur: NEGATIVE mg/dL
LEUKOCYTES UA: NEGATIVE
Nitrite: NEGATIVE
PROTEIN: NEGATIVE mg/dL
SPECIFIC GRAVITY, URINE: 1.014 (ref 1.005–1.030)
UROBILINOGEN UA: 1 mg/dL (ref 0.0–1.0)
pH: 6 (ref 5.0–8.0)

## 2013-07-24 LAB — GLUCOSE, CAPILLARY
GLUCOSE-CAPILLARY: 92 mg/dL (ref 70–99)
Glucose-Capillary: 85 mg/dL (ref 70–99)
Glucose-Capillary: 90 mg/dL (ref 70–99)
Glucose-Capillary: 92 mg/dL (ref 70–99)

## 2013-07-24 LAB — BASIC METABOLIC PANEL
BUN: 6 mg/dL (ref 6–23)
CHLORIDE: 104 meq/L (ref 96–112)
CO2: 29 meq/L (ref 19–32)
Calcium: 8.3 mg/dL — ABNORMAL LOW (ref 8.4–10.5)
Creatinine, Ser: 0.49 mg/dL — ABNORMAL LOW (ref 0.50–1.10)
GFR calc Af Amer: >90 mL/min (ref >90)
GFR calc non Af Amer: >90 mL/min (ref >90)
Glucose, Bld: 94 mg/dL (ref 70–99)
Potassium: 3.4 mEq/L — ABNORMAL LOW (ref 3.7–5.3)
Sodium: 144 mEq/L (ref 137–147)

## 2013-07-24 LAB — VALPROIC ACID LEVEL: Valproic Acid Lvl: 52.3 ug/mL (ref 50.0–100.0)

## 2013-07-24 MED ORDER — LORAZEPAM 2 MG/ML IJ SOLN
1.0000 mg | Freq: Once | INTRAMUSCULAR | Status: AC
  Administered 2013-07-24: 1 mg via INTRAVENOUS
  Filled 2013-07-24: qty 1

## 2013-07-24 MED ORDER — METOPROLOL TARTRATE 50 MG PO TABS: 75.0000 mg | ORAL_TABLET | Freq: Two times a day (BID) | ORAL | Status: DC

## 2013-07-24 MED ORDER — FREE WATER: 200.0000 mL | Freq: Four times a day (QID) | Status: DC

## 2013-07-24 MED ORDER — VITAL AF 1.2 CAL PO LIQD: 1000.0000 mL | ORAL | Status: DC

## 2013-07-24 MED ORDER — SODIUM CHLORIDE 0.9 % IV BOLUS (SEPSIS)
1000.0000 mL | Freq: Once | INTRAVENOUS | Status: AC
  Administered 2013-07-24: 1000 mL via INTRAVENOUS

## 2013-07-24 NOTE — ED Notes (Signed)
Per EMS: [facility] reports 1 tonic/clonic seizure x 10 minutes. Hx of the same. Pt recently discharged to this facility today. PT AO at baseline. 148/90. CBG 84. 20 RR. 130 ST.

## 2013-07-24 NOTE — ED Notes (Signed)
Pt unsure if she took [place] today. Denies any pain but states "I just don't feel well"

## 2013-07-24 NOTE — ED Notes (Signed)
Dr Zavitz at bedside  

## 2013-07-24 NOTE — Progress Notes (Signed)
Per MD order, PICC line removed. Cath intact at 36cm. Vaseline pressure gauze to site, pressure held x 5min. No bleeding to site. Pt instructed to keep dressing CDI x 24 hours. If bleeding occurs hold pressure, and contact nursing staff. Pt does not have any questions. Consuello Masseimmons, Mustafa Potts M

## 2013-07-24 NOTE — Progress Notes (Signed)
Patient was transported by EMS with stretcher around 1700 she had no c/o pain and no signs of distress and remained on room air.

## 2013-07-24 NOTE — ED Notes (Signed)
Pt has repeated motion of left and right arm jerking. Pt remains AO at baseline, but states "this is part of when I am having a seizure"

## 2013-07-24 NOTE — Discharge Summary (Signed)
Physician Discharge Summary  Joan [name] ZOX:[id number] DOB: Oct 07, 1960 DOA: 07/17/2013  PCP: Joan Einstein, MD  Admit date: 07/17/2013 Discharge date: 07/24/2013  Time spent: >30 minutes  Recommendations for Outpatient Follow-up:  1. Slowly advance tube feedings 2. BMET to follow electrolytes and renal function 3. Please maintain good hydration  Discharge Diagnoses:  Active Problems:   Acute encephalopathy   Convulsions/seizures   Essential hypertension, benign   Nausea with vomiting   Malfunction of jejunostomy tube   SIRS (systemic inflammatory response syndrome)   Recurrent vomiting   Discharge Condition: stable and improved. Will discharge back to SNF for rehabilitation and further care.  Diet recommendation: continue tube feedings; slowly advance to goal. Heart healthy (low sodium for HTN).  Filed Weights   07/22/13 0424 07/23/13 0154 07/24/13 0536  Weight: 78.2 kg (172 lb 6.4 oz) 76.431 kg (168 lb 8 oz) 77.429 kg (170 lb 11.2 oz)    History of present illness:  53 y.o. female who was accepted to a MED-Surg floor from [facility] facility on 06/19/2013.  On review of chart it is noted that patient was scheduled for percutaneous G-tube placement on 1/20 patient was agitated confused and was sent to the emergency room for evaluation of sinus tachycardia and hypertensive urgency-a PICC line was placed IV saline bolus was given.  It is noted that she was admitted recently 12/13-12/28 with toxic metabolic encephalopathy-CSF suggestive meningoencephalitis but bacterial and viral CSF studies were negative-CSF tau was 1433 24 EGD showed no recurrent seizures and generalized slowing-hospital course was complicated with pyelonephritis, C. difficile colitis which were all treated.  Patient was reportedly altered at the nursing home and seemed to be "comatose". Per the son who is at bedside currently, it appears that patient was comatose at home as well prior to coming to the  hospital last hospital admission 12/13. 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 Amada Jupiter of neurology today on admission note was her very well and he states that he thinks that she is having minor status epilepticus which is not apparent grossly. The EEG performed on last admission showed this. He is recommending that we keep her in hospital and consider plasmapheresis as well as suppressive steroids to reduce the seizure foci.  Patient is completely incoherent although she is alert. She rambles and has significant tangentiality when discussing things. She's not hearing any voices denies any suicidal intent on direct line of questioning, but it is very difficult to get her to stay on topic.  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 Pt received diatech catheter on 06/20/2013 placement and received and finished plasmapheresis- -speech to eval swallowing--dysphagia 1 diet with thin liquids  -Checked serum B12--667  - RBC folate--693  - RPR--NR  -ammonia--17  -TSH--3.282  -previous UAs/urine cultures negative  -Updated information revealed that the patient did not receive any medications during her short stay [name]--question seizure with postictal state as reason for patient's acute encephalopathy  -The patient has outpatient neurology appointment on 08/23/2013 which she should keep   Sinus tachycardia/SIRS  -Tachycardia improving with the restart and adjustment of metoprolol  -According to the medical record this has been a chronic problem for the last several weeks which showed some  improvement after beta blocker was started on the last admission  -This is likely due to her acute  medical illness  -Right upper extremity duplex negative for DVT  -abdominal binder at all times (will help with pain and distension) -Continue ciprofloxacin IV--plan to continue until 07/26/2013 this will signify 14 days of antibiotic therapy from the last negative culture  -Blood cultures--negative  -CT angiogram chest is negative for pulmonary embolus  -Urinalysis negative for UTI -maintain good hydration   Recurrent vomiting/TF intolerance/Gastroparesis  -ABD xray shows few scatter fluid levels--?ileus  -pt has had BMs  -CT abdomen--negative for bowel obstruction. Chronic proximal jejunal dilatation. Small bilateral pleural effusions  -checked VPA level--28.5  - lipase--15  -appreciate surgical evaluation and recommendations--> at this point no obstruction seen; resume TF's and advance them slowly to target rate of 60cc/hr  -may need to restart reglan low dose if needed; but so far no further vomiting appreciated  Hx of Convulsions/Seizures  -Pt has a PMH of seizures and catatonia.  -06/20/13 MRI of the brain showed resolution of thalamic signal abnormality  -continue current AEDs  -CT head neg  Hx of Anxiety/depression and delirium  -Continue pt on Remeron and risperdal -will recommend constant reorientation -avoid benzodiazepines and narcotics  HTN  -Increase metoprolol to 75 mg twice a day  -HR better controlled  Malnutrition (severe protein calorie malnutrition) with history of Gastric Bypass/partial gastrectomy  - encourage PO intake with TF  -J-tube resutured/respositioned and stable at discharge -Gen Surg consulted, J-tube placed and underwent revision of j tube with exploratory laparotomy on 2/3 and small bowel resection.  -She is able to take a dysphagia 1 diet by mouth which I encouraged the patient to do  -continue tube feedings  Bacteremia  -On her previous admission, the patient had Klebsiella pneumoniae bacteremia for which she was discharged on ciprofloxacin   -She was continued on ciprofloxacin throughout her hospitalization  -Continue ciprofloxacin through 07/26/2013  -Repeated blood cultures have remained negative.   Thrombocytopenia  -No acute bleeding appreciated -chronic -close follow up as an outpatient  Pleural effusions  -Currently no respiratory distress  -Oxygen saturation 97-100% on room air  -repeat CXR 07/19/13 shows improvement of effusions  -no wheezing, no SOB, no rales or crackles on exam  Loose Stools  -Patient had large volume yellow loose stool in the ED on admission.  -C. difficile PCR negative  -stool more form at discharge -appears to be 2/2 to abx's and TF's   Procedures:  See below for x-ray reports   Consultations:  General surgery   Discharge Exam: Filed Vitals:   07/24/13 1017  BP: 127/66  Pulse: 110  Temp: 99.5 F (37.5 C)  Resp: 20    General: afebrile, AAOX3 (slight confusion regarding themes and topics; mixing out her answers partially; but base on records essentially at baseline) Cardiovascular: S1 and S2, no rubs, gallops or murmurs, slight tachycardic  Respiratory: CTA bilaterally Abdomen: with J tube in place; positive BS, no distension and no tenderness Extremities: no edema, no cyanosis or clubbing   Discharge Instructions  Discharge Orders   Future Appointments Provider Department Dept Phone   08/23/2013 4:00 PM Ronal Fear, NP [facility] [phone]   Future Orders Complete By Expires   Diet - low sodium heart healthy  As directed    Discharge instructions  As directed    Comments:     Take medications as prescribed Arrange follow up with PCP in 2 weeks Slowly advance rate  of Tube feedings; watch residuals (goal is for 60cc/hr) Maintain good hydration Dysphagia 1 thin liquids (type of diet); speech therapy to follow and dictate advance in diet as tolerated Physical rehabilitation as per facility protocol   Increase activity slowly  As directed         Medication List    STOP taking these medications       ciprofloxacin 400 MG/40ML Soln injection  Commonly known as:  CIPRO     potassium chloride 20 MEQ/15ML (10%) solution      TAKE these medications       acetaminophen 325 MG tablet  Commonly known as:  TYLENOL  Take 650 mg by mouth every 4 (four) hours as needed for mild pain or fever.     chlorhexidine 0.12 % solution  Commonly known as:  PERIDEX  Use as directed 15 mLs in the mouth or throat 2 (two) times daily.     ciprofloxacin 400 MG/200ML Soln  Commonly known as:  CIPRO  Inject 200 mLs (400 mg total) into the vein every 12 (twelve) hours. Stop after last dose on 07/26/13     feeding supplement (VITAL AF 1.2 CAL) Liqd  Place 1,000 mLs into feeding tube continuous. Slowly advance rate to a goal of 60cc/hr; follow residual amount     ferrous sulfate 325 (65 FE) MG tablet  Take 1 tablet (325 mg total) by mouth 2 (two) times daily with a meal.     free water Soln  Place 200 mLs into feeding tube every 6 (six) hours.     levETIRAcetam 1000 MG tablet  Commonly known as:  KEPPRA  Take 1,000 mg by mouth 2 (two) times daily.     loperamide 2 MG capsule  Commonly known as:  IMODIUM  Take 4 mg by mouth 3 (three) times daily as needed for diarrhea or loose stools.     metoprolol 50 MG tablet  Commonly known as:  LOPRESSOR  Take 1.5 tablets (75 mg total) by mouth 2 (two) times daily.     mirtazapine 15 MG disintegrating tablet  Commonly known as:  REMERON SOL-TAB  Take 15 mg by mouth at bedtime.     ondansetron 4 MG tablet  Commonly known as:  ZOFRAN  Take 1 tablet (4 mg total) by mouth every 8 (eight) hours as needed for nausea or vomiting.     pantoprazole sodium 40 mg/20 mL Pack  Commonly known as:  PROTONIX  Place 20 mLs (40 mg total) into feeding tube daily at 12 noon.     risperiDONE 0.5 MG disintegrating tablet  Commonly known as:  RISPERDAL M-TABS  Take 1 tablet (0.5 mg total) by mouth 2 (two) times  daily.     valproic acid 250 MG capsule  Commonly known as:  DEPAKENE  Take 2 capsules (500 mg total) by mouth 3 (three) times daily.     Valproic Acid 250 MG/5ML Syrp syrup  Commonly known as:  DEPAKENE  Take 500 mg by mouth 3 (three) times daily.       Allergies  Allergen Reactions  . Morphine And Related Other (See Comments)    REACTION: Causes pain  . Paroxetine Hcl Other (See Comments)    REACTION:  unknown  . Penicillins Other (See Comments)    REACTION: Makes her body hurt.  . Sertraline Hcl Other (See Comments)    REACTION:  unknown       Follow-up Information   Follow up with [facility], [place]. Schedule  an appointment as soon as possible for a visit in 2 weeks.   Specialty:  Internal Medicine   Contact information:   [address] [place] [address] [phone]        The results of significant diagnostics from this hospitalization (including imaging, microbiology, ancillary and laboratory) are listed below for reference.    Significant Diagnostic Studies: Ct Abdomen Pelvis Wo Contrast  07/10/2013   CLINICAL DATA:  Sepsis question intra-abdominal source  EXAM: CT ABDOMEN AND PELVIS WITHOUT CONTRAST  TECHNIQUE: Multidetector CT imaging of the abdomen and pelvis was performed following the standard protocol without intravenous contrast. Sagittal and coronal MPR images reconstructed from axial data set. GI contrast administered  COMPARISON:  CT pelvis 05/17/2013; CT abdomen and pelvis 06/02/2011  FINDINGS: Small moderate-sized right pleural effusion with basilar atelectasis.  Minimal atelectasis left base.  Gallbladder surgically absent.  Prior gastric surgery.  Jejunostomy tube left mid abdomen, through which GI contrast was administered.  Dilated small bowel loops in the upper abdomen terminating at an anastomotic staple line in the left mid abdomen most compatible with small bowel obstruction ; the lack of dilatation of the remaining small bowel and colon  makes ileus much less likely.  Small bowel measures up to 4.8 cm transverse mild bowel wall thickening of the dilated loops.  Distal small bowel is decompressed and unremarkable.  Small foci of gas in the left mid abdomen into the upper left pelvis appear to be within several small bowel loops which are nondistended and no definite free intraperitoneal air is identified.  Decompressed colon containing contrast and a rectal tube.  Unremarkable bladder and ovaries with surgical absence of uterus.  Small amount of free pelvic fluid.  Diffuse soft tissue edema/ anasarca.  No mass, adenopathy, or hernia.  Osseous structures unremarkable.  IMPRESSION: Dilated proximal small bowel loops proximal to the jejunostomy site most consistent with small bowel obstruction, associated with mild bowel wall thickening and duodenal dilatation.  Decompressed distal small bowel and colon.  Small amount of nonspecific free pelvic fluid.   Electronically Signed   By: Ulyses Southward M.D.   On: 07/10/2013 17:38   Dg Abd 1 View  06/27/2013   CLINICAL DATA:  Nausea, abdominal pain, vomiting  EXAM: ABDOMEN - 1 VIEW  COMPARISON:  None.  FINDINGS: There is a surgical drain present in the mid abdomen. There are surgical stated in the right paramedian abdominal wall. There is a small amount of contrast scattered throughout the colon. There is no bowel dilatation to suggest obstruction. There is no evidence of pneumoperitoneum, portal venous gas or pneumatosis. There are no pathologic calcifications along the expected course of the ureters.The osseous structures are unremarkable.  IMPRESSION: Nonobstructive bowel gas pattern.   Electronically Signed   By: Elige Ko   On: 06/27/2013 09:34   Ct Head Wo Contrast  07/23/2013   CLINICAL DATA:  Mental status change.  EXAM: CT HEAD WITHOUT CONTRAST  TECHNIQUE: Contiguous axial images were obtained from the base of the skull through the vertex without intravenous contrast.  COMPARISON:  MR C SPINE W/O  CM dated 06/22/2013  FINDINGS: No mass. No hydrocephalus. No hemorrhage. Orbits are intact. Paranasal sinuses are clear where visualized. Mastoids are clear. No acute bony abnormality.  IMPRESSION: No acute or focal abnormality.   Electronically Signed   By: Maisie Fus  Register   On: 07/23/2013 09:58   Ct Angio Chest Pe W/cm &/or Wo Cm  07/17/2013   CLINICAL DATA:  Tachycardia.  Upper chest pain, shortness of breath.  EXAM: CT ANGIOGRAPHY CHEST WITH CONTRAST  TECHNIQUE: Multidetector CT imaging of the chest was performed using the standard protocol during bolus administration of intravenous contrast. Multiplanar CT image reconstructions and MIPs were obtained to evaluate the vascular anatomy.  CONTRAST:  70mL OMNIPAQUE IOHEXOL 350 MG/ML SOLN  COMPARISON:  DG CHEST 1V PORT dated 07/17/2013; CT ANGIO CHEST W/CM &/OR WO/CM dated 07/11/2013  FINDINGS: No filling defects in the pulmonary arteries to suggest pulmonary emboli. Heart is normal size. Aorta is normal caliber. No dissection.  Moderate left pleural effusion and large right pleural effusion. These are stable since prior study. Compressive atelectasis in the lower lobes bilaterally. No mediastinal, hilar or axillary adenopathy. Chest wall soft tissues are unremarkable. Imaging into the upper abdomen shows no acute findings.  Review of the MIP images confirms the above findings.  IMPRESSION: No evidence of pulmonary embolus.  Continued bilateral effusions, right greater than left, unchanged. Compressive atelectasis in the lower lobes.  No change since prior study.   Electronically Signed   By: Charlett Nose M.D.   On: 07/17/2013 06:48   Ct Angio Chest Pe W/cm &/or Wo Cm  07/11/2013   CLINICAL DATA:  Altered mental status, tachycardia, chest pain and shortness breath  EXAM: CT ANGIOGRAPHY CHEST WITH CONTRAST  TECHNIQUE: Multidetector CT imaging of the chest was performed using the standard protocol during bolus administration of intravenous contrast. Multiplanar CT  image reconstructions and MIPs were obtained to evaluate the vascular anatomy.  CONTRAST:  OMNIPAQUE IOHEXOL 350 MG/ML SOLN  COMPARISON:  DG CHEST 1V PORT dated 07/11/2013; CT ABD/PELV WO CM dated 07/10/2013; CT CHEST W/CM dated 06/21/2013  FINDINGS: There are no filling defects within the pulmonary arteries to suggest acute pulmonary embolism. Acute findings of the aorta or great vessels. No pericardial fluid.  There are bilateral pleural effusions, moderate to large on the right and small on the left. There is associated right lower lobe passive atelectasis.  Limited view of the upper abdomen is unremarkable. Limited view of the skeleton demonstrates degenerative spurring.  Review of the MIP images confirms the above findings.  IMPRESSION: 1. No evidence acute pulmonary embolism. 2. Moderate to large right pleural effusion with associated passive atelectasis. 3. No pericardial fluid.   Electronically Signed   By: Genevive Bi M.D.   On: 07/11/2013 17:53   Ct Abdomen Pelvis W Contrast  07/21/2013   CLINICAL DATA:  Tachycardia and dislodged J-tube. Abdominal pain and vomiting.  EXAM: CT ABDOMEN AND PELVIS WITH CONTRAST  TECHNIQUE: Multidetector CT imaging of the abdomen and pelvis was performed using the standard protocol following bolus administration of intravenous contrast.  CONTRAST:  OMNIPAQUE IOHEXOL 300 MG/ML  SOLN  COMPARISON:  DG ABD 2 VIEWS dated 07/19/2013; CT ABD/PELV WO CM dated 07/10/2013  FINDINGS: Small bilateral pleural effusions are seen, right greater than left, with associated bibasilar airspace opacification likely reflecting atelectasis.  The liver and spleen are unremarkable in appearance. The patient is status post cholecystectomy, with clips noted along the gallbladder fossa. The pancreas and adrenal glands are unremarkable.  The kidneys are unremarkable in appearance. There is no evidence of hydronephrosis. No renal or ureteral stones are seen. Minimal nonspecific perinephric  stranding is noted on the left side.  A replacement J-tube is seen ending at the jejunum at the left lower quadrant. A bowel suture line is noted along the jejunum, with distention of more proximal jejunum to 4.7 cm in  diameter. There is no definite evidence of bowel obstruction; contrast progresses to the level of the rectum.  No free fluid is identified. The small bowel is unremarkable in appearance. Postoperative change is noted about the stomach. No acute vascular abnormalities are seen. Postoperative change is seen along the midline anterior abdominal wall.  The appendix is normal in caliber and filled with contrast, without evidence for appendicitis. The colon is largely decompressed and unremarkable in appearance.  The bladder is mildly distended and grossly unremarkable. The patient is status post hysterectomy. The ovaries are relatively symmetric; no suspicious adnexal masses are seen. No inguinal lymphadenopathy is seen.  Mild diffuse soft tissue edema is seen along the abdominal wall and proximal lower extremities, raising question for mild anasarca.  No acute osseous abnormalities are identified.  IMPRESSION: 1. Replacement J-tube seen ending at the jejunum at the left lower quadrant. Likely chronic distention of more proximal jejunum to 4.7 cm in diameter, with associated bowel suture line, but no evidence of bowel obstruction. Ingested contrast progresses to the level of the rectum. 2. Small bilateral pleural effusions, right greater than left, with associated bibasilar airspace opacification likely reflecting atelectasis. 3. Mild diffuse soft tissue edema along the abdominal wall and proximal lower extremities, raising question for mild anasarca.   Electronically Signed   By: Roanna Raider M.D.   On: 07/21/2013 05:55   Ir Removal Tun Cv Cath W/o Fl  07/11/2013   CLINICAL DATA:  Sepsis, concern for right internal jugular tunneled central catheter infection, finished plasmapheresis treatment,  request for removal.  EXAM: REMOVAL OF TUNNELED CENTRAL VENOUS CATHETER  PROCEDURE: Risks and benefits of procedure discussed with the patient's son over the phone, verbal consent was obtained.  The right chest tunneled central catheter site was prepped with chlorhexidine. A sterile gown and gloves were worn during the procedure.  Utilizing manual manipulation, the subcutaneous cuff of the central catheter was freed. The catheter was then successfully removed in its entirety. A sterile dressing was applied over the catheter exit site.  IMPRESSION: Removal of tunneled right internal jugular central catheter utilizing manual manipulation. The procedure was uncomplicated.  Read By:  Pattricia Boss PA-C   Electronically Signed   By: Malachy Moan M.D.   On: 07/11/2013 11:39   Ir Replc Duoden/jejuno Tube Percut W/fluoro  07/13/2013   CLINICAL DATA:  Occluded surgical jejunostomy requiring exchange.  EXAM: JEJUNOSTOMY TUBE EXCHANGE  CONTRAST:  10 ml Omnipaque-300  FLUOROSCOPY TIME:  1 min and 24 seconds.  PROCEDURE: Informed consent was obtained by telephone from the patient's son.  The pre-existing jejunostomy and surrounding skin were prepped with Betadine. Attempt was made to inject contrast via the pre-existing tube. The tube was cut and removed over a hydrophilic guidewire. A new 12 French red rubber jejunostomy catheter was advanced over the wire.  Final catheter position was confirmed with a fluoroscopic spot image obtained after injection of contrast. The catheter was secured at the exit site with a silk retention suture.  COMPLICATIONS: None.  FINDINGS: The pre-existing 97 French tube is occluded. A new 12 French tube was placed and advanced into the jejunum.  IMPRESSION: Replacement of occluded 10 French jejunostomy catheter. The tube was up sized to a 12 [place] catheter advanced into the jejunum over a guidewire.   Electronically Signed   By: Irish Lack M.D.   On: 07/13/2013 15:54   Dg Chest Port 1  View  07/20/2013   CLINICAL DATA:  Possible aspiration.  EXAM: PORTABLE  CHEST - 1 VIEW  COMPARISON:  Chest radiograph and CTA of the chest performed 07/17/2013  FINDINGS: The patient's right PICC is noted ending about the distal SVC.  The previously noted bilateral pleural effusions have improved. Bibasilar airspace opacity likely reflects residual atelectasis, without definite evidence of aspiration. No pneumothorax is seen.  The cardiomediastinal silhouette is borderline normal in size. No acute osseous abnormalities are identified.  IMPRESSION: Previously noted bilateral pleural effusions have improved. Bibasilar airspace opacity likely reflects residual atelectasis, without definite evidence of aspiration.   Electronically Signed   By: Roanna Raider M.D.   On: 07/20/2013 03:35   Dg Chest Portable 1 View  07/17/2013   CLINICAL DATA:  Abdominal pain, tachycardia.  EXAM: PORTABLE CHEST - 1 VIEW  COMPARISON:  CT ANGIO CHEST W/CM &/OR WO/CM dated 07/11/2013; DG CHEST 1V PORT dated 07/11/2013  FINDINGS: Moderate right pleural effusion, likely slightly increased since prior study. Interval removal of right hemodialysis catheter. Right PICC line is unchanged. Patchy right lung airspace disease and left basilar airspace disease medially, both slightly increased since prior study. No acute bony abnormality.  IMPRESSION: Increasing patchy bilateral airspace disease as above. Increasing moderate right effusion.   Electronically Signed   By: Charlett Nose M.D.   On: 07/17/2013 03:54   Dg Chest Port 1 View  07/11/2013   CLINICAL DATA:  Shortness of breath, evaluate pulmonary infiltrates  EXAM: PORTABLE CHEST - 1 VIEW  COMPARISON:  DG CHEST 1V PORT dated 07/10/2013; DG CHEST 1V PORT dated 07/03/2013; CT CHEST W/CM dated 06/21/2013; IR FLUORO GUIDE CV LINE*R* dated 06/20/2013; DG CHEST 1V PORT dated 05/11/2013  FINDINGS: Grossly unchanged cardiac silhouette and mediastinal contours given patient rotation. Stable position of  support apparatus. Grossly unchanged right mid lung heterogeneous airspace opacities with worsening bilateral medial basilar airspace opacities. No definite pleural effusion or pneumothorax. No evidence of edema. A minimal amount of enteric contrast is seen within in the gastric fundus. Post cholecystectomy. Grossly unchanged bones.  IMPRESSION: 1.  Stable positioning of support apparatus.  No pneumothorax. 2. Worsening bibasilar airspace disease, possibly atelectasis though progression of multifocal infection / aspiration is not excluded. A follow-up chest radiograph in 4 to 6 weeks after treatment is recommended to ensure resolution.   Electronically Signed   By: Simonne Come M.D.   On: 07/11/2013 07:47   Dg Chest Port 1 View  07/10/2013   CLINICAL DATA:  Shortness of breath.  Hypotension  EXAM: PORTABLE CHEST - 1 VIEW  COMPARISON:  DG CHEST 1V PORT dated 07/03/2013; CT CHEST W/CM dated 06/21/2013  FINDINGS: Right internal jugular dialysis catheter tip: Right atrium. Thoracic spondylosis noted. There is a band of airspace opacity in the right upper lobe just above the minor fissure. Improved aeration at the right lung base.  IMPRESSION: Improved aeration at the right lung base, with but there is a band of airspace opacity in the right upper lobe favoring atelectasis over pneumonia. Next item thoracic spondylosis.   Electronically Signed   By: Herbie Baltimore M.D.   On: 07/10/2013 08:27   Dg Chest Port 1 View  07/03/2013   CLINICAL DATA:  Tubes and lines in good position, detailed above.  EXAM: PORTABLE CHEST - 1 VIEW  COMPARISON:  Chest CT 06/21/2013  FINDINGS: There is nasogastric tube which terminates near the pylorus. Right IJ catheter, tip in the upper right atrium. Right upper extremity PICC, tip at the upper cavoatrial junction.  Oral contrast noted within the gastric fundus and  small bowel.  Haziness of the right base, with volume loss. Node definitive consolidation.  No cardiomegaly.  IMPRESSION: 1.  Negative for pneumonia. 2. Haziness of the right lower chest, likely pleural fluid or atelectasis. 3. Tubes and lines in good position, detailed above.   Electronically Signed   By: Tiburcio PeaJonathan  Watts M.D.   On: 07/03/2013 03:35   Dg Abd 2 Views  07/19/2013   CLINICAL DATA:  Abdominal pain and vomiting, recent bowel resection.  EXAM: ABDOMEN - 2 VIEW  COMPARISON:  DG ABD PORTABLE 1V dated 07/17/2013  FINDINGS: Bowel gas pattern appears nondilated and nonobstructive, with an overall paucity of bowel gas. A few scattered air-fluid levels seen on the lateral decubitus views. No intraperitoneal free air.  Jejunostomy tube projects and left lower quadrant, no residual enteric contrast. Surgical staple line projects in left mid abdomen. Surgical clips in the right upper quadrant likely reflect cholecystectomy. Soft tissue planes and included osseous structures are nonsuspicious.  IMPRESSION: A few scattered air-fluid levels may reflect mild ileus, less likely or early bowel obstruction.  Jejunostomy tube in place without residual enteric contrast.   Electronically Signed   By: Awilda Metroourtnay  Bloomer   On: 07/19/2013 22:27   Dg Abd Portable 1v  07/17/2013   CLINICAL DATA:  J-tube placement.  EXAM: PORTABLE ABDOMEN - 1 VIEW  COMPARISON:  07/13/2013  FINDINGS: Contrast was injected through the jejunostomy tube. This opacifies small bowel loops in the upper pelvis. No evidence of contrast extravasation.  Slightly prominent bowel loops or superiorly in the abdomen, nonspecific. Prior cholecystectomy. No free air.  IMPRESSION: Jejunostomy tube within upper pelvic small bowel loops. Mildly prominent abdominal small bowel loops which could reflect mild focal ileus although early or partial small bowel obstruction cannot be excluded.   Electronically Signed   By: Charlett NoseKevin  Dover M.D.   On: 07/17/2013 03:57   Dg Abd Portable 1v  07/12/2013   CLINICAL DATA:  Abdominal pain  EXAM: PORTABLE ABDOMEN - 1 VIEW  COMPARISON:  07/11/2013   FINDINGS: Postsurgical changes are again seen. A feeding catheter is noted. Scattered large and small bowel gas is seen. No obstructive changes are noted. No definitive free air is seen.  IMPRESSION: No significant change from the prior exam.   Electronically Signed   By: Alcide CleverMark  Lukens M.D.   On: 07/12/2013 07:44   Dg Abd Portable 1v  07/11/2013   CLINICAL DATA:  Abdominal distention, evaluate for small bowel obstruction  EXAM: PORTABLE ABDOMEN - 1 VIEW  COMPARISON:  CT ABD/PELV WO CM dated 07/10/2013; DG ABD PORTABLE 1V dated 07/10/2013; DG ABD PORTABLE 1V dated 06/30/2013  FINDINGS: There has been passage of the majority of previously administered enteric contrast. A minimal amount of enteric contrast remains within the colon. Paucity of bowel gas without definite evidence of obstruction. Nondiagnostic evaluation pneumoperitoneum secondary supine positioning exclusion of lower thorax. No definite pneumatosis or portal venous gas.  A feeding jejunostomy tube overlies the left mid/ lower abdomen.  A vertically aligned skin staples overlie the right mid abdomen. Post cholecystectomy.  Unchanged bones.  IMPRESSION: Paucity of bowel gas, however there has been apparent passage of previously ingested enteric contrast. No definite evidence of obstruction.   Electronically Signed   By: Simonne ComeJohn  Watts M.D.   On: 07/11/2013 08:26   Dg Abd Portable 1v  07/10/2013   CLINICAL DATA:  Assess for ileus  EXAM: PORTABLE ABDOMEN - 1 VIEW  COMPARISON:  June 30, 2013  FINDINGS: There is persistent  air-filled dilated small bowel loops in the left abdomen. Air is noted in the colon. Skin staples are projected over the right abdomen. Patient is status post prior cholecystectomy.  IMPRESSION: Persistent ileus.   Electronically Signed   By: Sherian Rein M.D.   On: 07/10/2013 02:30   Dg Abd Portable 1v  06/30/2013   CLINICAL DATA:  NG tube placement.  EXAM: PORTABLE ABDOMEN - 1 VIEW  COMPARISON:  Single view of the abdomen 06/27/2013.  Upper GI/small bowel follow-through 06/28/2013  FINDINGS: NG tube is in place in good position with the tip in this distal stomach. Contrast material in small bowel from the comparison upper GI series/small bowel follow-through. Small bowel loops are dilated. Only a small amount of loculated barium is seen in the colon.  IMPRESSION: NG tube in good position.  Small bowel obstruction.   Electronically Signed   By: Drusilla Kanner M.D.   On: 06/30/2013 19:01   Dg Ugi W/small Bowel  06/28/2013   CLINICAL DATA:  53 year old female postop 2 days since percutaneous jejunostomy tube placement. Drainage from NG tube. Abdominal pain. Initial encounter.  EXAM: UPPER GI SERIES WITH SMALL BOWEL FOLLOW-THROUGH  FLUOROSCOPIC GUIDED NG TUBE PLACEMENT  TECHNIQUE: Combined double contrast and single contrast upper GI series using effervescent crystals, thick barium, and thin barium. Subsequently, serial images of the small bowel were obtained including spot views of the terminal ileum.  COMPARISON:  KUB 06/27/2013 and earlier.  FLUOROSCOPY TIME:  9 min and 18 seconds.  FINDINGS: Preprocedural scout view of the abdomen demonstrates a small volume of loculated barium in the colon. This is related to the 06/17/2013 NG tube placement/ confirmation study.  Scout view demonstrates the recently placed right an mid lower abdomen percutaneous jejunostomy. Right lower abdomen skin staples in place.  The scout view also demonstrates the patient's NG tube is looped back up into the esophagus. This was evaluated in real-time with fluoroscopy, and a wire was placed through the tube in an effort to maneuver the tip back into the stomach. However, this was unsuccessful an the tube had be retracted into the nasopharynx. Using fluoroscopic guidance, the tube was then advanced with a wire into the stomach, such that the tip and side hole are within the stomach.  Given the recent abdominal surgery, initially water-soluble contrast was planned,  however prominent gastroesophageal reflux of contrast was noted early on, and the decision was made to switch contrast to thin barium. Water-soluble contrast was fully aspirated from the stomach. Subsequently, barium contrast was slowly administered.  The stomach appears small. No gastrojejunostomy is evident. After a short delay, barium emptied the stomach into the proximal duodenum. The second portion of the duodenum is dilated.  After 20 additional min significantly dilated proximal jejunum is opacified, with the small bowel caliber up to 58 mm.  At this point study was discussed with Dr. Jimmye Norman . We decided to inject the percutaneous jejunostomy tube, and give the barium more time to opacified a small bowel in hopes of loose to dating the small bowel obstruction transition point.  Barium injection through the jejunostomy tube demonstrates normal small bowel loops at the jejunostomy site. This barium was then aspirated.  Subsequently a 3 hr delayed image was obtained demonstrating a gradual tapering of small bowel loops into the right lower abdomen, heading toward the vicinity of the jejunostomy. These smaller loops measure 23 mm diameter (arrow).  At this point, Dr. Lindie Spruce advised that no additional imaging was needed.  IMPRESSION: 1. High-grade small bowel obstruction. Dilated duodenum and proximal jejunum up to 58 mm diameter. 2. Small bowel loops slowly taper to the right lower abdomen in the vicinity of the percutaneous jejunostomy. 3. The jejunostomy was injected with contrast, and is situated within normal decompressed small bowel. 4. Diminutive stomach. No gastrojejunostomy is evident. Prominent gastroesophageal reflux. 5. At the beginning of the exam the patient's NG tube was malpositioned. This was withdrawn to the nasopharynx and repositioned into the stomach under fluoroscopic guidance.   Electronically Signed   By: Augusto Gamble M.D.   On: 06/28/2013 16:29    Microbiology: Recent Results (from  the past 240 hour(s))  CULTURE, BLOOD (ROUTINE X 2)     Status: None   Collection Time    07/17/13  3:35 AM      Result Value Ref Range Status   Specimen Description BLOOD RIGHT PICC LINE   Final   Special Requests BOTTLES DRAWN AEROBIC AND ANAEROBIC 10CC   Final   Culture  Setup Time     Final   Value: 07/17/2013 08:11     Performed at [facility]   Culture     Final   Value: NO GROWTH 5 DAYS     Performed at [facility]   Report Status 07/23/2013 FINAL   Final  CULTURE, BLOOD (ROUTINE X 2)     Status: None   Collection Time    07/17/13  3:52 AM      Result Value Ref Range Status   Specimen Description BLOOD LEFT ARM   Final   Special Requests BOTTLES DRAWN AEROBIC ONLY 10CC   Final   Culture  Setup Time     Final   Value: 07/17/2013 08:11     Performed at [facility]   Culture     Final   Value: NO GROWTH 5 DAYS     Performed at [facility]   Report Status 07/23/2013 FINAL   Final  URINE CULTURE     Status: None   Collection Time    07/17/13  4:27 AM      Result Value Ref Range Status   Specimen Description URINE, CATHETERIZED   Final   Special Requests Normal   Final   Culture  Setup Time     Final   Value: 07/16/2013 03:22     Performed at [facility] Count     Final   Value: NO GROWTH     Performed at [facility]   Culture     Final   Value: NO GROWTH     Performed at [facility]   Report Status 07/18/2013 FINAL   Final  CLOSTRIDIUM DIFFICILE BY PCR     Status: None   Collection Time    07/17/13 10:30 AM      Result Value Ref Range Status   C difficile by pcr NEGATIVE  NEGATIVE Final  URINE CULTURE     Status: None   Collection Time    07/20/13  2:15 AM      Result Value Ref Range Status   Specimen Description URINE, CATHETERIZED   Final   Special Requests Cipro Immunocompromised   Final   Culture  Setup Time     Final   Value: 07/20/2013 12:13     Performed at [facility] Count     Final   Value: NO GROWTH  Performed at [facility]     Final   Value: NO GROWTH     Performed at [facility]   Report Status 07/21/2013 FINAL   Final     Labs: Basic Metabolic Panel:  Recent Labs Lab 07/19/13 0455 07/20/13 0510 07/21/13 0500 07/22/13 0500 07/23/13 0415  NA 143 141 138 141 144  K 4.4 4.5 3.9 3.5* 3.4*  CL 107 105 104 105 105  CO2 25 28 27 26 28   GLUCOSE 93 85 85 80 92  BUN 3* 5* 6 5* 5*  CREATININE 0.53 0.53 0.61 0.55 0.56  CALCIUM 8.0* 8.1* 7.7* 8.1* 8.2*  MG  --   --   --   --  1.9   Liver Function Tests:  Recent Labs Lab 07/20/13 0510 07/21/13 0500 07/23/13 1110  AST 22 22 15   ALT 10 11 9   ALKPHOS 78 76 79  BILITOT 0.3 0.3 0.3  PROT 4.8* 4.5* 4.9*  ALBUMIN 2.2* 2.0* 2.5*    Recent Labs Lab 07/20/13 0510  LIPASE 15    Recent Labs Lab 07/23/13 1110  AMMONIA 23   CBC:  Recent Labs Lab 07/19/13 0455 07/20/13 0510 07/21/13 0500 07/22/13 0500 07/23/13 0825  WBC 6.1 6.8 6.4 6.8 8.0  HGB 10.2* 10.0* 9.8* 9.7* 10.1*  HCT 31.2* 30.6* 29.4* 29.5* 30.4*  MCV 96.6 95.9 96.4 96.1 96.5  PLT 137* 136* 146* 151 167   CBG:  Recent Labs Lab 07/23/13 1636 07/23/13 1932 07/24/13 0053 07/24/13 0518 07/24/13 1148  GLUCAP 98 89 92 85 90    Signed:  Ndeye Tenorio  [facility] 07/24/2013, 1:45 PM

## 2013-07-24 NOTE — ED Notes (Signed)
Pt on bedpan.

## 2013-07-25 ENCOUNTER — Inpatient Hospital Stay (HOSPITAL_COMMUNITY): Payer: Medicare Other

## 2013-07-25 ENCOUNTER — Emergency Department (HOSPITAL_COMMUNITY): Payer: Medicare Other

## 2013-07-25 ENCOUNTER — Encounter (HOSPITAL_COMMUNITY): Payer: Self-pay | Admitting: Internal Medicine

## 2013-07-25 DIAGNOSIS — R000 Tachycardia, unspecified: Secondary | ICD-10-CM

## 2013-07-25 DIAGNOSIS — J189 Pneumonia, unspecified organism: Secondary | ICD-10-CM | POA: Diagnosis present

## 2013-07-25 DIAGNOSIS — D649 Anemia, unspecified: Secondary | ICD-10-CM

## 2013-07-25 DIAGNOSIS — R569 Unspecified convulsions: Secondary | ICD-10-CM | POA: Diagnosis present

## 2013-07-25 LAB — HEPATIC FUNCTION PANEL
ALT: 16 U/L (ref 0–35)
AST: 46 U/L — ABNORMAL HIGH (ref 0–37)
Albumin: 2.7 g/dL — ABNORMAL LOW (ref 3.5–5.2)
Alkaline Phosphatase: 82 U/L (ref 39–117)
TOTAL PROTEIN: 5.5 g/dL — AB (ref 6.0–8.3)
Total Bilirubin: 0.4 mg/dL (ref 0.3–1.2)

## 2013-07-25 LAB — GLUCOSE, CAPILLARY
GLUCOSE-CAPILLARY: 77 mg/dL (ref 70–99)
Glucose-Capillary: 89 mg/dL (ref 70–99)
Glucose-Capillary: 91 mg/dL (ref 70–99)

## 2013-07-25 LAB — COMPREHENSIVE METABOLIC PANEL
ALK PHOS: 68 U/L (ref 39–117)
ALT: 11 U/L (ref 0–35)
AST: 24 U/L (ref 0–37)
Albumin: 2.2 g/dL — ABNORMAL LOW (ref 3.5–5.2)
BUN: 6 mg/dL (ref 6–23)
CALCIUM: 7.7 mg/dL — AB (ref 8.4–10.5)
CO2: 26 mEq/L (ref 19–32)
Chloride: 109 mEq/L (ref 96–112)
Creatinine, Ser: 0.4 mg/dL — ABNORMAL LOW (ref 0.50–1.10)
GFR calc non Af Amer: >90 mL/min (ref >90)
GLUCOSE: 84 mg/dL (ref 70–99)
POTASSIUM: 3.4 meq/L — AB (ref 3.7–5.3)
Sodium: 144 mEq/L (ref 137–147)
TOTAL PROTEIN: 4.6 g/dL — AB (ref 6.0–8.3)
Total Bilirubin: 0.3 mg/dL (ref 0.3–1.2)

## 2013-07-25 LAB — URINALYSIS, ROUTINE W REFLEX MICROSCOPIC
BILIRUBIN URINE: NEGATIVE
Glucose, UA: NEGATIVE mg/dL
Ketones, ur: NEGATIVE mg/dL
Nitrite: NEGATIVE
PH: 7 (ref 5.0–8.0)
Protein, ur: NEGATIVE mg/dL
SPECIFIC GRAVITY, URINE: 1.015 (ref 1.005–1.030)
Urobilinogen, UA: 2 mg/dL — ABNORMAL HIGH (ref 0.0–1.0)

## 2013-07-25 LAB — URINE MICROSCOPIC-ADD ON

## 2013-07-25 LAB — CBC WITH DIFFERENTIAL/PLATELET
BASOS ABS: 0 10*3/uL (ref 0.0–0.1)
BASOS PCT: 0 % (ref 0–1)
Eosinophils Absolute: 0 10*3/uL (ref 0.0–0.7)
Eosinophils Relative: 0 % (ref 0–5)
HEMATOCRIT: 31.4 % — AB (ref 36.0–46.0)
Hemoglobin: 10.4 g/dL — ABNORMAL LOW (ref 12.0–15.0)
Lymphocytes Relative: 13 % (ref 12–46)
Lymphs Abs: 1.5 10*3/uL (ref 0.7–4.0)
MCH: 32.5 pg (ref 26.0–34.0)
MCHC: 33.1 g/dL (ref 30.0–36.0)
MCV: 98.1 fL (ref 78.0–100.0)
Monocytes Absolute: 1.2 10*3/uL — ABNORMAL HIGH (ref 0.1–1.0)
Monocytes Relative: 10 % (ref 3–12)
NEUTROS ABS: 8.8 10*3/uL — AB (ref 1.7–7.7)
NEUTROS PCT: 77 % (ref 43–77)
Platelets: 154 10*3/uL (ref 150–400)
RBC: 3.2 MIL/uL — ABNORMAL LOW (ref 3.87–5.11)
RDW: 19.7 % — AB (ref 11.5–15.5)
WBC: 11.4 10*3/uL — ABNORMAL HIGH (ref 4.0–10.5)

## 2013-07-25 LAB — URINE CULTURE
Colony Count: NO GROWTH
Culture: NO GROWTH

## 2013-07-25 LAB — CBG MONITORING, ED: Glucose-Capillary: 82 mg/dL (ref 70–99)

## 2013-07-25 LAB — LIPASE, BLOOD: LIPASE: 11 U/L (ref 11–59)

## 2013-07-25 LAB — MRSA PCR SCREENING: MRSA by PCR: NEGATIVE

## 2013-07-25 LAB — LEVETIRACETAM LEVEL: LEVETIRACETAM: 19.2 ug/mL (ref 5.0–30.0)

## 2013-07-25 LAB — I-STAT CG4 LACTIC ACID, ED: Lactic Acid, Venous: 0.77 mmol/L (ref 0.5–2.2)

## 2013-07-25 MED ORDER — VALPROIC ACID 250 MG PO CAPS
500.0000 mg | ORAL_CAPSULE | Freq: Three times a day (TID) | ORAL | Status: DC
  Filled 2013-07-25 (×3): qty 2

## 2013-07-25 MED ORDER — SODIUM CHLORIDE 0.9 % IJ SOLN
3.0000 mL | Freq: Two times a day (BID) | INTRAMUSCULAR | Status: DC
  Administered 2013-07-25 – 2013-07-30 (×10): 3 mL via INTRAVENOUS

## 2013-07-25 MED ORDER — POTASSIUM CHLORIDE 20 MEQ/15ML (10%) PO LIQD
40.0000 meq | Freq: Once | ORAL | Status: AC
  Administered 2013-07-25: 40 meq via ORAL
  Filled 2013-07-25: qty 30

## 2013-07-25 MED ORDER — CHLORHEXIDINE GLUCONATE 0.12 % MT SOLN
15.0000 mL | Freq: Two times a day (BID) | OROMUCOSAL | Status: DC
  Administered 2013-07-25 – 2013-07-30 (×10): 15 mL via OROMUCOSAL
  Filled 2013-07-25 (×12): qty 15

## 2013-07-25 MED ORDER — ACETAMINOPHEN 325 MG PO TABS
650.0000 mg | ORAL_TABLET | Freq: Four times a day (QID) | ORAL | Status: DC | PRN
  Administered 2013-07-28: 650 mg via ORAL
  Filled 2013-07-25: qty 2

## 2013-07-25 MED ORDER — SODIUM CHLORIDE 0.9 % IV SOLN: INTRAVENOUS | Status: DC

## 2013-07-25 MED ORDER — LORAZEPAM 2 MG/ML IJ SOLN
INTRAMUSCULAR | Status: AC
  Filled 2013-07-25: qty 1

## 2013-07-25 MED ORDER — FERROUS SULFATE 325 (65 FE) MG PO TABS
325.0000 mg | ORAL_TABLET | Freq: Two times a day (BID) | ORAL | Status: DC
  Administered 2013-07-25 – 2013-07-27 (×5): 325 mg via ORAL
  Filled 2013-07-25 (×8): qty 1

## 2013-07-25 MED ORDER — ACETAMINOPHEN 650 MG RE SUPP: 650.0000 mg | Freq: Four times a day (QID) | RECTAL | Status: DC | PRN

## 2013-07-25 MED ORDER — VALPROATE SODIUM 500 MG/5ML IV SOLN
750.0000 mg | Freq: Once | INTRAVENOUS | Status: AC
  Administered 2013-07-25: 750 mg via INTRAVENOUS
  Filled 2013-07-25: qty 7.5

## 2013-07-25 MED ORDER — DEXTROSE 5 % IV SOLN
1.0000 g | Freq: Three times a day (TID) | INTRAVENOUS | Status: DC
  Administered 2013-07-25 – 2013-07-30 (×17): 1 g via INTRAVENOUS
  Filled 2013-07-25 (×18): qty 1

## 2013-07-25 MED ORDER — PANTOPRAZOLE SODIUM 40 MG PO PACK
40.0000 mg | PACK | Freq: Every day | ORAL | Status: DC
  Administered 2013-07-25 – 2013-07-30 (×6): 40 mg
  Filled 2013-07-25 (×7): qty 20

## 2013-07-25 MED ORDER — VANCOMYCIN HCL 10 G IV SOLR
1500.0000 mg | Freq: Once | INTRAVENOUS | Status: AC
  Administered 2013-07-25: 1500 mg via INTRAVENOUS
  Filled 2013-07-25: qty 1500

## 2013-07-25 MED ORDER — SODIUM CHLORIDE 0.9 % IV SOLN
1000.0000 mg | Freq: Two times a day (BID) | INTRAVENOUS | Status: DC
  Filled 2013-07-25: qty 10

## 2013-07-25 MED ORDER — RISPERIDONE 0.5 MG PO TBDP
0.5000 mg | ORAL_TABLET | Freq: Two times a day (BID) | ORAL | Status: DC
  Administered 2013-07-25 – 2013-07-30 (×11): 0.5 mg via ORAL
  Filled 2013-07-25 (×13): qty 1

## 2013-07-25 MED ORDER — ONDANSETRON HCL 4 MG PO TABS: 4.0000 mg | ORAL_TABLET | Freq: Four times a day (QID) | ORAL | Status: DC | PRN

## 2013-07-25 MED ORDER — FREE WATER
200.0000 mL | Freq: Four times a day (QID) | Status: DC
  Administered 2013-07-25 – 2013-07-30 (×20): 200 mL

## 2013-07-25 MED ORDER — ONDANSETRON HCL 4 MG PO TABS: 4.0000 mg | ORAL_TABLET | Freq: Three times a day (TID) | ORAL | Status: DC | PRN

## 2013-07-25 MED ORDER — VALPROIC ACID 250 MG PO CAPS
750.0000 mg | ORAL_CAPSULE | Freq: Three times a day (TID) | ORAL | Status: DC
  Administered 2013-07-25 – 2013-07-26 (×5): 750 mg via ORAL
  Filled 2013-07-25 (×6): qty 3

## 2013-07-25 MED ORDER — VITAL AF 1.2 CAL PO LIQD
1000.0000 mL | ORAL | Status: DC
  Administered 2013-07-25 – 2013-07-30 (×8): 1000 mL
  Filled 2013-07-25 (×11): qty 1000

## 2013-07-25 MED ORDER — MIRTAZAPINE 15 MG PO TBDP
15.0000 mg | ORAL_TABLET | Freq: Every day | ORAL | Status: DC
  Administered 2013-07-25 – 2013-07-28 (×4): 15 mg via ORAL
  Filled 2013-07-25 (×5): qty 1

## 2013-07-25 MED ORDER — SODIUM CHLORIDE 0.9 % IV SOLN
INTRAVENOUS | Status: AC
  Administered 2013-07-25 – 2013-07-26 (×3): via INTRAVENOUS

## 2013-07-25 MED ORDER — SODIUM CHLORIDE 0.9 % IV SOLN
500.0000 mg | Freq: Once | INTRAVENOUS | Status: AC
  Administered 2013-07-25: 500 mg via INTRAVENOUS
  Filled 2013-07-25: qty 5

## 2013-07-25 MED ORDER — SODIUM CHLORIDE 0.9 % IV BOLUS (SEPSIS)
1000.0000 mL | Freq: Once | INTRAVENOUS | Status: AC
  Administered 2013-07-25: 1000 mL via INTRAVENOUS

## 2013-07-25 MED ORDER — ONDANSETRON HCL 4 MG/2ML IJ SOLN: 4.0000 mg | Freq: Three times a day (TID) | INTRAMUSCULAR | Status: DC | PRN

## 2013-07-25 MED ORDER — METOPROLOL TARTRATE 50 MG PO TABS
75.0000 mg | ORAL_TABLET | Freq: Two times a day (BID) | ORAL | Status: DC
  Administered 2013-07-25 – 2013-07-29 (×9): 75 mg via ORAL
  Filled 2013-07-25 (×10): qty 1

## 2013-07-25 MED ORDER — SODIUM CHLORIDE 0.9 % IV SOLN
1500.0000 mg | Freq: Two times a day (BID) | INTRAVENOUS | Status: DC
  Administered 2013-07-25 – 2013-07-30 (×11): 1500 mg via INTRAVENOUS
  Filled 2013-07-25 (×12): qty 15

## 2013-07-25 MED ORDER — DEXTROSE 5 % IV SOLN
2.0000 g | Freq: Once | INTRAVENOUS | Status: AC
  Administered 2013-07-25: 2 g via INTRAVENOUS

## 2013-07-25 MED ORDER — LORAZEPAM 2 MG/ML IJ SOLN
2.0000 mg | Freq: Once | INTRAMUSCULAR | Status: AC
  Administered 2013-07-25: 2 mg via INTRAVENOUS

## 2013-07-25 MED ORDER — ONDANSETRON HCL 4 MG/2ML IJ SOLN
4.0000 mg | Freq: Four times a day (QID) | INTRAMUSCULAR | Status: DC | PRN
  Administered 2013-07-25: 4 mg via INTRAVENOUS
  Filled 2013-07-25: qty 2

## 2013-07-25 MED ORDER — VANCOMYCIN HCL IN DEXTROSE 1-5 GM/200ML-% IV SOLN
1000.0000 mg | Freq: Two times a day (BID) | INTRAVENOUS | Status: DC
Start: 2013-07-25 — End: 2013-07-28
  Administered 2013-07-25 – 2013-07-28 (×7): 1000 mg via INTRAVENOUS
  Filled 2013-07-25 (×8): qty 200

## 2013-07-25 MED ORDER — LORAZEPAM 2 MG/ML IJ SOLN
INTRAMUSCULAR | Status: DC
Start: 2013-07-25 — End: 2013-07-25
  Filled 2013-07-25: qty 1

## 2013-07-25 NOTE — ED Notes (Signed)
Pt began to have seizure like activity, tonic-clonic lasting about one minute, witnessed by this RN and Susy FrizzleMatt, EMT. HR increased to 140, pt suctioned. Dr. Jodi MourningZavitz made aware as well as Dr. Toniann FailKakrakandy.

## 2013-07-25 NOTE — ED Provider Notes (Addendum)
CSN: [id number]     Arrival date & time 07/24/13  2240 History   First MD Initiated Contact with Patient 07/24/13 2256     Chief Complaint  Patient presents with  . Seizures     (Consider location/radiation/quality/duration/timing/severity/associated sxs/prior Treatment) HPI Comments: 53 yo female with encephalopathy, AKI, HTN, J tube placement recently, NH living presents after witnessed generalized seizure PTA 10 min, similar to previous.  Unsure if taking meds, keppra.  Pt post ictal on arrival, gradually improving.  Pt just does not feel well, no specific complaints.  No new meds.  No fevers or neck stiffness.  No current abx.    Patient is a 52 y.o. female presenting with seizures. The history is provided by the patient and the EMS personnel.  Seizures   Past Medical History  Diagnosis Date  . Peripheral neuropathy   . Fibromyalgia   . Hypertension   . Anxiety   . Catatonia   . Seizures    Past Surgical History  Procedure Laterality Date  . Gastric bypass    . Abdominal hysterectomy    . Jejunostomy N/A 06/25/2013    Procedure:  OPEN JEJUNOSTOMY FEEDING TUBE ;  Surgeon: Cherylynn Ridges, MD;  Location: [facility] OR;  Service: General;  Laterality: N/A;  . Laparotomy N/A 07/02/2013    Procedure: EXPLORATORY LAPAROTOMY with revision of jejunostomy feeding tube.;  Surgeon: Cherylynn Ridges, MD;  Location: [facility] OR;  Service: General;  Laterality: N/A;  . Bowel resection N/A 07/02/2013    Procedure: SMALL BOWEL RESECTION;  Surgeon: Cherylynn Ridges, MD;  Location: [facility] OR;  Service: General;  Laterality: N/A;   Family History  Problem Relation Age of Onset  . Hypertension Mother   . Diabetes Father    History  Substance Use Topics  . Smoking status: Never Smoker   . Smokeless tobacco: Not on file  . Alcohol Use: No   OB History   Grav Para Term Preterm Abortions TAB SAB Ect Mult Living                 Review of Systems  Constitutional: Positive for appetite change and fatigue. Negative  for fever and chills.  HENT: Negative for congestion.   Eyes: Negative for visual disturbance.  Respiratory: Negative for shortness of breath.   Cardiovascular: Negative for chest pain.  Gastrointestinal: Negative for vomiting and abdominal pain.  Genitourinary: Negative for dysuria and flank pain.  Musculoskeletal: Negative for back pain, neck pain and neck stiffness.  Skin: Negative for rash.  Neurological: Positive for seizures and weakness (general). Negative for light-headedness and headaches.      Allergies  Morphine and related; Paroxetine hcl; Penicillins; and Sertraline hcl  Home Medications   Current Outpatient Rx  Name  Route  Sig  Dispense  Refill  . acetaminophen (TYLENOL) 325 MG tablet   Oral   Take 650 mg by mouth every 4 (four) hours as needed for mild pain or fever.         . chlorhexidine (PERIDEX) 0.12 % solution   Mouth/Throat   Use as directed 15 mLs in the mouth or throat 2 (two) times daily.         . ciprofloxacin (CIPRO) 400 MG/200ML SOLN   Intravenous   Inject 200 mLs (400 mg total) into the vein every 12 (twelve) hours. Stop after last dose on 07/26/13   200 mL   16   . ferrous sulfate 325 (65 FE) MG tablet   Oral  Take 1 tablet (325 mg total) by mouth 2 (two) times daily with a meal.      3   . levETIRAcetam (KEPPRA) 1000 MG tablet   Oral   Take 1,000 mg by mouth 2 (two) times daily.         [name]. loperamide (IMODIUM) 2 MG capsule   Oral   Take 4 mg by mouth 3 (three) times daily as needed for diarrhea or loose stools.          . metoprolol (LOPRESSOR) 50 MG tablet   Oral   Take 1.5 tablets (75 mg total) by mouth 2 (two) times daily.   60 tablet   0   . mirtazapine (REMERON SOL-TAB) 15 MG disintegrating tablet   Oral   Take 15 mg by mouth at bedtime.         . Nutritional Supplements (FEEDING SUPPLEMENT, VITAL AF 1.2 CAL,) LIQD   Per Tube   Place 1,000 mLs into feeding tube continuous. Slowly advance rate to a goal of  60cc/hr; follow residual amount   1000 mL   0   . ondansetron (ZOFRAN) 4 MG tablet   Oral   Take 1 tablet (4 mg total) by mouth every 8 (eight) hours as needed for nausea or vomiting.   20 tablet   0   . pantoprazole sodium (PROTONIX) 40 mg/20 mL PACK   Per Tube   Place 20 mLs (40 mg total) into feeding tube daily at 12 noon.   30 each      . risperiDONE (RISPERDAL M-TABS) 0.5 MG disintegrating tablet   Oral   Take 1 tablet (0.5 mg total) by mouth 2 (two) times daily.         [name]. valproic acid (DEPAKENE) 250 MG capsule   Oral   Take 2 capsules (500 mg total) by mouth 3 (three) times daily.   90 capsule   0   . Valproic Acid (DEPAKENE) 250 MG/5ML SYRP syrup   Oral   Take 500 mg by mouth 3 (three) times daily.         . Water For Irrigation, Sterile (FREE WATER) SOLN   Per Tube   Place 200 mLs into feeding tube every 6 (six) hours.          BP 121/50  Pulse 114  Temp(Src) 100 F (37.8 C) (Rectal)  Resp 19  SpO2 100% Physical Exam  Nursing note and vitals reviewed. Constitutional: She is oriented to person, place, and time. She appears well-developed and well-nourished.  HENT:  Head: Normocephalic and atraumatic.  Dry mm  Eyes: Conjunctivae are normal. Right eye exhibits no discharge. Left eye exhibits no discharge.  Neck: Normal range of motion. Neck supple. No tracheal deviation present.  Cardiovascular: Regular rhythm.  Tachycardia present.   Pulmonary/Chest: Effort normal. She has rales (few bilateral bases).  Abdominal: Soft. She exhibits no distension. There is no tenderness (J tube in place, no induration/ tenderness or rash surrounding, no discharge-). There is no guarding.  Musculoskeletal: She exhibits no edema.  Neurological: She is alert and oriented to person, place, and time. GCS eye subscore is 4. GCS verbal subscore is 5. GCS motor subscore is 6.  General weakness bilateral 4+ Sensation intact bilateral  eomfi perrl Neck supple Normal  speech Mild fatigue- post ictal  Skin: Skin is warm. No rash noted.  Psychiatric: She has a normal mood and affect.    ED Course  Procedures (including critical care time) Labs Review Labs  Reviewed  CBC WITH DIFFERENTIAL - Abnormal; Notable for the following:    RBC 3.54 (*)    Hemoglobin 11.3 (*)    HCT 34.4 (*)    RDW 19.7 (*)    All other components within normal limits  BASIC METABOLIC PANEL - Abnormal; Notable for the following:    Potassium 3.4 (*)    Creatinine, Ser 0.49 (*)    Calcium 8.3 (*)    All other components within normal limits  URINALYSIS, ROUTINE W REFLEX MICROSCOPIC - Abnormal; Notable for the following:    Hgb urine dipstick LARGE (*)    Urobilinogen, UA 2.0 (*)    Leukocytes, UA TRACE (*)    All other components within normal limits  CULTURE, BLOOD (ROUTINE X 2)  CULTURE, BLOOD (ROUTINE X 2)  VALPROIC ACID LEVEL  URINE MICROSCOPIC-ADD ON  LEVETIRACETAM LEVEL  CBG MONITORING, ED  I-STAT CG4 LACTIC ACID, ED   Imaging Review Ct Head Wo Contrast  07/23/2013   CLINICAL DATA:  Mental status change.  EXAM: CT HEAD WITHOUT CONTRAST  TECHNIQUE: Contiguous axial images were obtained from the base of the skull through the vertex without intravenous contrast.  COMPARISON:  MR C SPINE W/O CM dated 06/22/2013  FINDINGS: No mass. No hydrocephalus. No hemorrhage. Orbits are intact. Paranasal sinuses are clear where visualized. Mastoids are clear. No acute bony abnormality.  IMPRESSION: No acute or focal abnormality.   Electronically Signed   By: Maisie Fus  Register   On: 07/23/2013 09:58    EKG Interpretation    Date/Time:  Wednesday July 24 2013 22:47:41 EST Ventricular Rate:  137 PR Interval:  130 QRS Duration: 71 QT Interval:  307 QTC Calculation: 463 R Axis:   64 Text Interpretation:  Sinus tachycardia Baseline artifact Confirmed by Teriah Muela  MD, Saivon Prowse ([id number]) on 07/25/2013 1:17:39 AM            MDM   Final diagnoses:  HCAP (healthcare-associated  pneumonia)  Seizure   Post ictal on arrival. Plan to look for source of infection/ metabolic.   Pt returned to baseline, holding on CT head at this time.  Fluid bolus in ED.  Hx of sinus tachycardia, ekg and fluids. Keppra bolus 500 mg in ED.  Repeat fluids given.  EKG reviewed.  Pt remained tachycardic, general fatigue, low grade fever, infiltrate on xray. HCAP abx/ cultures. Admitted to tele, spoke with Dr Toniann Fail   Results and differential diagnosis were discussed with the patient. Close follow up outpatient was discussed, patient comfortable with the plan.         Enid Skeens, MD 07/25/13 [phone]  While waiting for bed placement pt had brief 1 min seizure.  No ativan required. 2nd 500 mg keppra ordered.  Admission previously placed.    Enid Skeens, MD 07/25/13 [phone]

## 2013-07-25 NOTE — ED Notes (Signed)
Dr. Kakrakandy at bedside. 

## 2013-07-25 NOTE — Procedures (Signed)
EEG report.  Brief clinical history:  [age] year old female with a history of seizures on Depakote and Keppra. Discharged from the hospital today and noted to have a seizure at her accepting facility. Had further seizures in the ED despite additional Keppra doses.  Patient has a history of status epilepticus but does not appear to be in status at this time with her being able to follow commands and answer questions at baseline. Last valproic acid level was 52.  Technique: this is a 17 channel routine scalp EEG performed at the bedside with bipolar and monopolar montages arranged in accordance to the international 10/20 system of electrode placement. One channel was dedicated to EKG recording.  The study was performed during wakefulness and drowsiness. No activating procedures performed.  Description:In the wakeful state, the best background consisted of a medium amplitude, posterior dominant, poorly sustained, symmetric and reactive 8 Hz rhythm. Drowsiness demonstrated dropout of the alpha rhythm. No focal or generalized epileptiform discharges noted.  No slowing seen.  EKG showed sinus rhythm.  Impression: this is a normal awake and drowsy EEG. Please, be aware that a normal EEG does not exclude the possibility of epilepsy.  Clinical correlation is advised.  [name] Portelasvaldo Wenceslaus Gist, MD

## 2013-07-25 NOTE — ED Notes (Signed)
Report attempted x 1

## 2013-07-25 NOTE — Progress Notes (Signed)
Orthopedic Tech Progress Note Patient Details:  Joan [name] 07/15/1960 [id number]  Ortho Devices Type of Ortho Device: Abdominal binder Ortho Device/Splint Interventions: Casandra DoffingOrdered   Yasemin Rabon Craig 07/25/2013, 7:26 PM

## 2013-07-25 NOTE — Progress Notes (Signed)
ANTIBIOTIC CONSULT NOTE - INITIAL  Pharmacy Consult for Vancomycin and Cefepime Indication: rule out pneumonia  Allergies  Allergen Reactions  . Morphine And Related Other (See Comments)    REACTION: Causes pain  . Paroxetine Hcl Other (See Comments)    REACTION:  unknown  . Penicillins Other (See Comments)    REACTION: Makes her body hurt.  . Sertraline Hcl Other (See Comments)    REACTION:  unknown    Patient Measurements: Height: 5' 2.99" (160 cm) Weight: 170 lb 10.2 oz (77.4 kg) IBW/kg (Calculated) : 52.38  Vital Signs: Temp: 100 F (37.8 C) (02/25 2348) Temp src: Rectal (02/25 2348) BP: 153/108 mmHg (02/26 0315) Pulse Rate: 117 (02/26 0315) Intake/Output from previous day:   Intake/Output from this shift:    Labs:  Recent Labs  07/22/13 0500 07/23/13 0415 07/23/13 0825 07/24/13 2304  WBC 6.8  --  8.0 7.5  HGB 9.7*  --  10.1* 11.3*  PLT 151  --  167 176  CREATININE 0.55 0.56  --  0.49*   Estimated Creatinine Clearance: 82 ml/min (by C-G formula based on Cr of 0.49). No results found for this basename: VANCOTROUGH, Leodis BinetVANCOPEAK, VANCORANDOM, GENTTROUGH, GENTPEAK, GENTRANDOM, TOBRATROUGH, TOBRAPEAK, TOBRARND, AMIKACINPEAK, AMIKACINTROU, AMIKACIN,  in the last 72 hours   Microbiology: Recent Results (from the past 720 hour(s))  CULTURE, BLOOD (ROUTINE X 2)     Status: None   Collection Time    07/03/13  6:40 AM      Result Value Ref Range Status   Specimen Description BLOOD LEFT HAND   Final   Special Requests BOTTLES DRAWN AEROBIC ONLY 2CC   Final   Culture  Setup Time     Final   Value: 07/03/2013 13:19     [facility]formed [facility]   Culture     Final   Value: NO GROWTH 5 DAYS     Performed at [facility]   Report Status 07/09/2013 FINAL   Final  CULTURE, BLOOD (ROUTINE X 2)     Status: None   Collection Time    07/03/13  6:55 AM      Result Value Ref Range Status   Specimen Description BLOOD LEFT ARM   Final   Special Requests  BOTTLES DRAWN AEROBIC ONLY 1CC   Final   Culture  Setup Time     Final   Value: 07/03/2013 13:19     Performed at [facility]   Culture     Final   Value: NO GROWTH 5 DAYS     Performed at [facility]   Report Status 07/09/2013 FINAL   Final  CLOSTRIDIUM DIFFICILE BY PCR     Status: None   Collection Time    07/06/13 10:27 AM      Result Value Ref Range Status   C difficile by pcr NEGATIVE  NEGATIVE Final  URINE CULTURE     Status: None   Collection Time    07/10/13  8:15 AM      Result Value Ref Range Status   Specimen Description URINE, CATHETERIZED   Final   Special Requests NONE   Final   Culture  Setup Time     Final   Value: 07/10/2013 09:13     Performed at [facility]   Colony Count     Final   Value: 50,000 COLONIES/ML     Performed at [facility]   Culture     Final  Value: KLEBSIELLA PNEUMONIAE     YEAST     Performed at [facility]   Report Status 07/13/2013 FINAL   Final   Organism ID, Bacteria KLEBSIELLA PNEUMONIAE   Final  CLOSTRIDIUM DIFFICILE BY PCR     Status: None   Collection Time    07/10/13  8:33 AM      Result Value Ref Range Status   C difficile by pcr NEGATIVE  NEGATIVE Final  CULTURE, BLOOD (ROUTINE X 2)     Status: None   Collection Time    07/10/13 10:55 AM      Result Value Ref Range Status   Specimen Description BLOOD LEFT ARM   Final   Special Requests BOTTLES DRAWN AEROBIC ONLY 8CC   Final   Culture  Setup Time     Final   Value: 07/10/2013 16:23     Performed at [facility]   Culture     Final   Value: KLEBSIELLA PNEUMONIAE     Note: Gram Stain Report Called to,Read Back By and Verified With: HOLLY CHURCH 07/11/13 AT 0330 RIDK     Performed at [facility]   Report Status 07/13/2013 FINAL   Final   Organism ID, Bacteria KLEBSIELLA PNEUMONIAE   Final  MRSA PCR SCREENING     Status: None   Collection Time    07/10/13 12:55 PM      Result Value Ref Range Status   MRSA by  PCR NEGATIVE  NEGATIVE Final   Comment:            The GeneXpert MRSA Assay (FDA     approved for NASAL specimens     only), is one component of a     comprehensive MRSA colonization     surveillance program. It is not     intended to diagnose MRSA     infection nor to guide or     monitor treatment for     MRSA infections.  CULTURE, BLOOD (ROUTINE X 2)     Status: None   Collection Time    07/12/13 10:40 AM      Result Value Ref Range Status   Specimen Description BLOOD LEFT FOREARM   Final   Special Requests BOTTLES DRAWN AEROBIC AND ANAEROBIC 10CC   Final   Culture  Setup Time     Final   Value: 07/12/2013 14:02     Performed at [facility]   Culture     Final   Value: NO GROWTH 5 DAYS     Performed at [facility]   Report Status 07/18/2013 FINAL   Final  CULTURE, BLOOD (ROUTINE X 2)     Status: None   Collection Time    07/12/13 10:50 AM      Result Value Ref Range Status   Specimen Description BLOOD LEFT ARM   Final   Special Requests BOTTLES DRAWN AEROBIC AND ANAEROBIC 10CC   Final   Culture  Setup Time     Final   Value: 07/12/2013 14:02     Performed at [facility]   Culture     Final   Value: NO GROWTH 5 DAYS     Performed at [facility]   Report Status 07/18/2013 FINAL   Final  CULTURE, BLOOD (ROUTINE X 2)     Status: None   Collection Time    07/17/13  3:35 AM      Result Value Ref Range  Status   Specimen Description BLOOD RIGHT PICC LINE   Final   Special Requests BOTTLES DRAWN AEROBIC AND ANAEROBIC 10CC   Final   Culture  Setup Time     Final   Value: 07/17/2013 08:11     Performed at [facility]   Culture     Final   Value: NO GROWTH 5 DAYS     Performed at [facility]   Report Status 07/23/2013 FINAL   Final  CULTURE, BLOOD (ROUTINE X 2)     Status: None   Collection Time    07/17/13  3:52 AM      Result Value Ref Range Status   Specimen Description BLOOD LEFT ARM   Final   Special Requests  BOTTLES DRAWN AEROBIC ONLY 10CC   Final   Culture  Setup Time     Final   Value: 07/17/2013 08:11     Performed at [facility]   Culture     Final   Value: NO GROWTH 5 DAYS     Performed at [facility]   Report Status 07/23/2013 FINAL   Final  URINE CULTURE     Status: None   Collection Time    07/17/13  4:27 AM      Result Value Ref Range Status   Specimen Description URINE, CATHETERIZED   Final   Special Requests Normal   Final   Culture  Setup Time     Final   Value: 07/16/2013 03:22     Performed at [facility] Count     Final   Value: NO GROWTH     Performed at [facility]   Culture     Final   Value: NO GROWTH     Performed at [facility]   Report Status 07/18/2013 FINAL   Final  CLOSTRIDIUM DIFFICILE BY PCR     Status: None   Collection Time    07/17/13 10:30 AM      Result Value Ref Range Status   C difficile by pcr NEGATIVE  NEGATIVE Final  URINE CULTURE     Status: None   Collection Time    07/20/13  2:15 AM      Result Value Ref Range Status   Specimen Description URINE, CATHETERIZED   Final   Special Requests Cipro Immunocompromised   Final   Culture  Setup Time     Final   Value: 07/20/2013 12:13     Performed at [facility] Count     Final   Value: NO GROWTH     Performed at [facility]   Culture     Final   Value: NO GROWTH     Performed at [facility]   Report Status 07/21/2013 FINAL   Final    Medical History: Past Medical History  Diagnosis Date  . Peripheral neuropathy   . Fibromyalgia   . Hypertension   . Anxiety   . Catatonia   . Seizures     Medications:  APAP  Iron  Keppra  Loperamide  Lopressor  Remeron  Zofran  Protonix  Risperdal  Depakene    Assessment: 53 yo female with seizures, possible PNA, for empiric antibiotics.  Vancomycin 1500 mg IV given in ED at 0340  Goal of Therapy:  Vancomycin trough level 15-20 mcg/ml  Plan:  Vancomycin  1 g IV q12h Cefepime 1 g IV q8h  Egbert Seidel, Gary Fleet 07/25/2013,3:57 AM

## 2013-07-25 NOTE — ED Notes (Signed)
Portable x-ray and phlebotomist at bedside.

## 2013-07-25 NOTE — Progress Notes (Addendum)
[facility] Progress Note  TEAM 1 - Stepdown/ICU TEAM   Joan PeelingLora Follette [id number] DOB: 02/25/1961 DOA: 07/24/2013 PCP: Karlene EinsteinASANAYAKA,[name], MD  Brief narrative: Joan [name] is a 53 y.o. female presenting on 07/24/2013 with female history of seizures and who was recently admitted for encephalopathy and was treated for possible autoimmune encephalitis, had complicated course following J-tube placement with bowel obstruction was brought from the nursing home after patient was found to have generalized tonic-clonic seizures which lasted for around 10 minutes. Patient was post ictal after the seizures and was initially found to be lethargic in the ER. Patient also was found to be mildly febrile. Later patient became more alert awake. Chest x-ray shows possible pneumonic process. In the ER patient was given 500 mg of IV Keppra and was started on vancomycin and cefepime for seizures. Patient did have almost a year after the Keppra was started. I have consulted Dr. Thad Rangereynolds from neurology. As per the nursing home staff patient did not have any nausea vomiting abdominal pain chest pain or shortness of breath.   Subjective: Lethargic   Assessment/Plan:  Recurrent Seizures  -Pt has a PMH of seizures and catatonia.  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 Pt received diatech catheter on 06/20/2013 placement and received and finished plasmapheresis-  Possible HCAP - cont Vanc and Cefepime  - J tube/ Gastroparesis  - cont Vital and free water  Hx of Anxiety/depression and delirium  -as outpt,  pt on Remeron and risperdal  -attempt to avoid benzodiazepines and narcotics   HTN  -metoprolol  75 mg twice a day  -HR better controlled   Malnutrition (severe protein calorie malnutrition) with history of Gastric  Bypass/partial gastrectomy  -Gen Surg placed J-tube  and underwent revision of j tube with exploratory laparotomy on 2/3 and small bowel resection.  -She was able to take a dysphagia 1 diet by mouth  -continue tube feedings   Bacteremia  -for Klebsiella pneumoniae bacteremia  -Plan was to continue ciprofloxacin through 07/26/2013   Thrombocytopenia  -improved to normal  Pleural effusions  -noted to have resolved on CXR performed yesterday  Code Status: Full code Family Communication: none Disposition Plan: follow in SDU  Consultants: Neuro  Procedures: None  Antibiotics: Vanc and Cefepime 2/26 Cipro -   DVT prophylaxis: SCDs  Objective: Filed Weights   07/25/13 0315  Weight: 77.4 kg (170 lb 10.2 oz)   Blood pressure 116/65, pulse 96, temperature 98.1 F (36.7 C), temperature source Oral, resp. rate 26, height 5' 2.99" (1.6 m), weight 77.4 kg (170 lb 10.2 oz), SpO2 100.00%.  Intake/Output Summary (Last 24 hours) at 07/25/13 1140 Last data filed at 07/25/13 1026  Gross per 24 hour  Intake    365 ml  Output      0 ml  Net    365 ml     Exam: General: No acute respiratory distress- lethargic and will not open eyes Lungs: Clear to auscultation bilaterally without wheezes or crackles Cardiovascular: Regular rate and rhythm without murmur gallop or rub normal S1 and S2 Abdomen: Nontender, nondistended, soft, bowel sounds positive, no rebound, no ascites, no appreciable mass- J tube intact Extremities: No significant cyanosis, clubbing, or edema bilateral lower extremities  Data Reviewed: Basic Metabolic Panel:  Recent Labs Lab 07/21/13 0500 07/22/13 0500 07/23/13 0415 07/24/13 2304 07/25/13 0642  NA 138 141  144 144 144  K 3.9 3.5* 3.4* 3.4* 3.4*  CL 104 105 105 104 109  CO2 27 26 28 29 26   GLUCOSE 85 80 92 94 84  BUN 6 5* 5* 6 6  CREATININE 0.61 0.55 0.56 0.49* 0.40*  CALCIUM 7.7* 8.1* 8.2* 8.3* 7.7*  MG  --   --  1.9  --   --    Liver Function  Tests:  Recent Labs Lab 07/20/13 0510 07/21/13 0500 07/23/13 1110 07/24/13 2304 07/25/13 0642  AST 22 22 15  46* 24  ALT 10 11 9 16 11   ALKPHOS 78 76 79 82 68  BILITOT 0.3 0.3 0.3 0.4 0.3  PROT 4.8* 4.5* 4.9* 5.5* 4.6*  ALBUMIN 2.2* 2.0* 2.5* 2.7* 2.2*    Recent Labs Lab 07/20/13 0510 07/24/13 2304  LIPASE 15 11    Recent Labs Lab 07/23/13 1110  AMMONIA 23   CBC:  Recent Labs Lab 07/21/13 0500 07/22/13 0500 07/23/13 0825 07/24/13 2304 07/25/13 0642  WBC 6.4 6.8 8.0 7.5 11.4*  NEUTROABS  --   --   --  5.1 8.8*  HGB 9.8* 9.7* 10.1* 11.3* 10.4*  HCT 29.4* 29.5* 30.4* 34.4* 31.4*  MCV 96.4 96.1 96.5 97.2 98.1  PLT 146* 151 167 176 154   Cardiac Enzymes: No results found for this basename: CKTOTAL, CKMB, CKMBINDEX, TROPONINI,  in the last 168 hours BNP (last 3 results) No results found for this basename: PROBNP,  in the last 8760 hours CBG:  Recent Labs Lab 07/24/13 0939 07/24/13 1148 07/24/13 1613 07/25/13 0420 07/25/13 0621  GLUCAP 89 90 92 82 77    Recent Results (from the past 240 hour(s))  CULTURE, BLOOD (ROUTINE X 2)     Status: None   Collection Time    07/17/13  3:35 AM      Result Value Ref Range Status   Specimen Description BLOOD RIGHT PICC LINE   Final   Special Requests BOTTLES DRAWN AEROBIC AND ANAEROBIC 10CC   Final   Culture  Setup Time     Final   Value: 07/17/2013 08:11     Performed at [facility]   Culture     Final   Value: NO GROWTH 5 DAYS     Performed at [facility]   Report Status 07/23/2013 FINAL   Final  CULTURE, BLOOD (ROUTINE X 2)     Status: None   Collection Time    07/17/13  3:52 AM      Result Value Ref Range Status   Specimen Description BLOOD LEFT ARM   Final   Special Requests BOTTLES DRAWN AEROBIC ONLY 10CC   Final   Culture  Setup Time     Final   Value: 07/17/2013 08:11     Performed at [facility]   Culture     Final   Value: NO GROWTH 5 DAYS     Performed at [facility]   Report Status 07/23/2013 FINAL   Final  URINE CULTURE     Status: None   Collection Time    07/17/13  4:27 AM      Result Value Ref Range Status   Specimen Description URINE, CATHETERIZED   Final   Special Requests Normal   Final   Culture  Setup Time     Final   Value: 07/16/2013 03:22     Performed at [facility] Count     Final  Value: NO GROWTH     Performed at [facility]   Culture     Final   Value: NO GROWTH     Performed at [facility]   Report Status 07/18/2013 FINAL   Final  CLOSTRIDIUM DIFFICILE BY PCR     Status: None   Collection Time    07/17/13 10:30 AM      Result Value Ref Range Status   C difficile by pcr NEGATIVE  NEGATIVE Final  URINE CULTURE     Status: None   Collection Time    07/20/13  2:15 AM      Result Value Ref Range Status   Specimen Description URINE, CATHETERIZED   Final   Special Requests Cipro Immunocompromised   Final   Culture  Setup Time     Final   Value: 07/20/2013 12:13     Performed at [facility] Count     Final   Value: NO GROWTH     Performed at [facility]   Culture     Final   Value: NO GROWTH     Performed at [facility]   Report Status 07/21/2013 FINAL   Final  URINE CULTURE     Status: None   Collection Time    07/24/13  1:28 AM      Result Value Ref Range Status   Specimen Description URINE, CATHETERIZED   Final   Special Requests NONE   Final   Culture  Setup Time     Final   Value: 07/24/2013 08:19     Performed at [facility] Count     Final   Value: NO GROWTH     Performed at [facility]   Culture     Final   Value: NO GROWTH     Performed at [facility]   Report Status 07/25/2013 FINAL   Final  MRSA PCR SCREENING     Status: None   Collection Time    07/25/13  5:39 AM      Result Value Ref Range Status   MRSA by PCR NEGATIVE  NEGATIVE Final   Comment:            The GeneXpert MRSA  Assay (FDA     approved for NASAL specimens     only), is one component of a     comprehensive MRSA colonization     surveillance program. It is not     intended to diagnose MRSA     infection nor to guide or     monitor treatment for     MRSA infections.     Studies:  Recent x-ray studies have been reviewed in detail by the Attending Physician  Scheduled Meds:  Scheduled Meds: . ceFEPime (MAXIPIME) IV  1 g Intravenous Q8H  . chlorhexidine  15 mL Mouth/Throat BID  . ferrous sulfate  325 mg Oral BID WC  . free water  200 mL Per Tube Q6H  . levETIRAcetam  1,500 mg Intravenous Q12H  . metoprolol  75 mg Oral BID  . mirtazapine  15 mg Oral QHS  . pantoprazole sodium  40 mg Per Tube Q1200  . risperiDONE  0.5 mg Oral BID  . sodium chloride  3 mL Intravenous Q12H  . valproic acid  750 mg Oral TID  . vancomycin  1,000 mg Intravenous Q12H   Continuous Infusions: . sodium chloride 100 mL/hr at  07/25/13 0848  . feeding supplement (VITAL AF 1.2 CAL) 1,000 mL (07/25/13 1138)    Time spent on care of this patient: >35 min   Calvert Cantor, MD  [facility] Office  [phone] Pager - Text Page per Amion as per below:  On-Call/Text Page:      [email or web address]      password [email or web address]  If 7PM-7AM, please contact night-coverage [email or web address] Password [email or web address] 07/25/2013, 11:40 AM   LOS: 1 day

## 2013-07-25 NOTE — Progress Notes (Signed)
INITIAL NUTRITION ASSESSMENT  DOCUMENTATION CODES Per approved criteria  -Not Applicable   INTERVENTION: Continue Vital AF 1.2 @ goal rate of 60 ml/hr via J-tube. At goal rate, tube feeding regimen will provide 1728 kcal, 108 grams of protein, and 1166 ml of H2O.  Free water flushes per MD, 200 ml every 6 hours, provides additional 800 ml of free water to provide a total of 1966 ml.  RD to continue to monitor nutrition care plan  NUTRITION DIAGNOSIS: Inadequate oral intake related to inability to eat as evidenced by NPO status.   Goal: Pt to meet >/= 90% of their estimated nutrition needs   Monitor:  TF initiation/rate, weight trends, labs, I/O's  Reason for Assessment: Tube feeding/ Malnutrition Screening Tool  53 y.o. female  Admitting Dx: Seizures  ASSESSMENT: 53 y.o. 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 Patient was post ictal after the seizures and was initially found to be lethargic in the ER. Patient also was found to be mildly febrile. Chest x-ray shows possible pneumonic process.  As per the nursing home staff patient did not have any nausea vomiting abdominal pain chest pain or shortness of breath.   RD familiar with pt from previous admission. Pt asleep at time of visit with no tube feeds running. TF is now running with Vital AF 1.2 @ 60 ml/hr. This provides 1728 kcal and 108 grams of protein which meets 102% of minimum estimated energy needs and 108% of maximum estimated protein needs.  When asked about her usual body weight during previous admission, pt stated her weight fluctuates.   Labs: low potassium, low calcium, low hemoglobin  Height: Ht Readings from Last 1  Encounters:  07/25/13 5' 2.99" (1.6 m)    Weight: Wt Readings from Last 1 Encounters:  07/25/13 170 lb 10.2 oz (77.4 kg)    Ideal Body Weight: 115 lbs  % Ideal Body Weight: 148%  Wt Readings from Last 10 Encounters:  07/25/13 170 lb 10.2 oz (77.4 kg)  07/24/13 170 lb 11.2 oz (77.429 kg)  07/16/13 182 lb 12.2 oz (82.9 kg)  07/16/13 182 lb 12.2 oz (82.9 kg)  07/16/13 182 lb 12.2 oz (82.9 kg)  05/17/13 154 lb 12.2 oz (70.2 kg)    Usual Body Weight: unknown  % Usual Body Weight: NA  BMI:  Body mass index is 30.23 kg/(m^2).  Estimated Nutritional Needs: Kcal: 1700-1900 Protein: 85-100 grams Fluid: 1.9 L/day  Skin: +2 generalized edema; +1 RUE, LUE, RLE, and LLE edema; abdominal incision  Diet Order:    EDUCATION NEEDS: -No education needs identified at this time   Intake/Output Summary (Last 24 hours) at 07/25/13 1054 Last data filed at 07/25/13 1026  Gross per 24 hour  Intake    365 ml  Output      0 ml  Net    365 ml    Last BM: 2/25   Labs:   Recent Labs Lab 07/23/13 0415 07/24/13 2304 07/25/13 0642  NA 144 144 144  K 3.4* 3.4* 3.4*  CL 105 104 109  CO2 28 29 26   BUN 5* 6 6  CREATININE 0.56 0.49* 0.40*  CALCIUM 8.2* 8.3* 7.7*  MG 1.9  --   --   GLUCOSE 92 94 84  CBG (last 3)   Recent Labs  07/24/13 1613 07/25/13 0420 07/25/13 0621  GLUCAP 92 82 77    Scheduled Meds: . ceFEPime (MAXIPIME) IV  1 g Intravenous Q8H  . chlorhexidine  15 mL Mouth/Throat BID  . ferrous sulfate  325 mg Oral BID WC  . free water  200 mL Per Tube Q6H  . levETIRAcetam  1,500 mg Intravenous Q12H  . metoprolol  75 mg Oral BID  . mirtazapine  15 mg Oral QHS  . pantoprazole sodium  40 mg Per Tube Q1200  . risperiDONE  0.5 mg Oral BID  . sodium chloride  3 mL Intravenous Q12H  . valproic acid  750 mg Oral TID  . vancomycin  1,000 mg Intravenous Q12H    Continuous Infusions: . sodium chloride 100 mL/hr at 07/25/13 0848  . feeding supplement (VITAL AF 1.2  CAL)      Past Medical History  Diagnosis Date  . Peripheral neuropathy   . Fibromyalgia   . Hypertension   . Anxiety   . Catatonia   . Seizures     Past Surgical History  Procedure Laterality Date  . Gastric bypass    . Abdominal hysterectomy    . Jejunostomy N/A 06/25/2013    Procedure:  OPEN JEJUNOSTOMY FEEDING TUBE ;  Surgeon: Cherylynn Ridges, MD;  Location: [facility] OR;  Service: General;  Laterality: N/A;  . Laparotomy N/A 07/02/2013    Procedure: EXPLORATORY LAPAROTOMY with revision of jejunostomy feeding tube.;  Surgeon: Cherylynn Ridges, MD;  Location: [facility] OR;  Service: General;  Laterality: N/A;  . Bowel resection N/A 07/02/2013    Procedure: SMALL BOWEL RESECTION;  Surgeon: Cherylynn Ridges, MD;  Location: [facility] OR;  Service: General;  Laterality: N/A;    Ian Malkin RD, LDN Inpatient Clinical Dietitian Pager: [phone] After Hours Pager: [phone]

## 2013-07-25 NOTE — Progress Notes (Signed)
Bedside EEG completed. 

## 2013-07-25 NOTE — H&P (Signed)
[facility] History and Physical  Allysson Rinehimer ZOX:[id number] DOB: 04-18-1961 DOA: 07/24/2013  Referring physician: ER physician. PCP: [name], MD   Chief Complaint: Seizures.  HPI: Joan [name] is a 53 y.o. 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 Patient was post ictal after the seizures and was initially found to be lethargic in the ER. Patient also was found to be mildly febrile. Later patient became more alert awake. Chest x-ray shows possible pneumonic process. In the ER patient was given 500 mg of IV Keppra and was started on vancomycin and cefepime for seizures. Patient did have almost a year after the Keppra was started. I have consulted Dr. Thad Ranger from neurology. As per the nursing home staff patient did not have any nausea vomiting abdominal pain chest pain or shortness of breath.   Review of Systems: As presented in the history of presenting illness, rest negative.  Past Medical History  Diagnosis Date  . Peripheral neuropathy   . Fibromyalgia   . Hypertension   . Anxiety   . Catatonia   . Seizures    Past Surgical History  Procedure Laterality Date  . Gastric bypass    . Abdominal hysterectomy    . Jejunostomy N/A 06/25/2013    Procedure:  OPEN JEJUNOSTOMY FEEDING TUBE ;  Surgeon: Cherylynn Ridges, MD;  Location: [facility] OR;  Service: General;  Laterality: N/A;  . Laparotomy N/A 07/02/2013    Procedure: EXPLORATORY LAPAROTOMY with revision of jejunostomy feeding tube.;  Surgeon: Cherylynn Ridges, MD;  Location: [facility] OR;  Service: General;  Laterality: N/A;  . Bowel resection N/A 07/02/2013    Procedure: SMALL BOWEL RESECTION;  Surgeon: Cherylynn Ridges, MD;  Location: [facility] OR;  Service:  General;  Laterality: N/A;   Social History:  reports that she has never smoked. She does not have any smokeless tobacco history on file. She reports that she does not drink alcohol. Her drug history is not on file. Where does patient live nursing home. Can patient participate in ADLs? No.  Allergies  Allergen Reactions  . Morphine And Related Other (See Comments)    REACTION: Causes pain  . Paroxetine Hcl Other (See Comments)    REACTION:  unknown  . Penicillins Other (See Comments)    REACTION: Makes her body hurt.  . Sertraline Hcl Other (See Comments)    REACTION:  unknown    Family History:  Family History  Problem Relation Age of Onset  . Hypertension Mother   . Diabetes Father       Prior to Admission medications   Medication Sig Start Date End Date Taking? Authorizing Provider  acetaminophen (TYLENOL) 325 MG tablet Take 650 mg by mouth every 4 (four) hours as needed for mild pain or fever.   Yes Historical Provider, MD  chlorhexidine (PERIDEX) 0.12 % solution Use as directed 15 mLs in the mouth or throat 2 (two) times daily.   Yes Historical Provider, MD  ferrous sulfate 325 (65 FE) MG tablet Take 1 tablet (325 mg total) by mouth 2 (two) times daily with a meal. 07/16/13  Yes Leroy Sea, MD  levETIRAcetam (KEPPRA) 1000 MG tablet Take 1,000 mg by mouth 2 (two) times daily.   Yes Historical Provider, MD  loperamide (IMODIUM) 2  MG capsule Take 4 mg by mouth 3 (three) times daily as needed for diarrhea or loose stools.    Yes Historical Provider, MD  metoprolol (LOPRESSOR) 50 MG tablet Take 1.5 tablets (75 mg total) by mouth 2 (two) times daily. 07/24/13  Yes Vassie Lollarlos Madera, MD  mirtazapine (REMERON SOL-TAB) 15 MG disintegrating tablet Take 15 mg by mouth at bedtime.   Yes Historical Provider, MD  Nutritional Supplements (FEEDING SUPPLEMENT, VITAL AF 1.2 CAL,) LIQD Place 1,000 mLs into feeding tube continuous. Slowly advance rate to a goal of 60cc/hr; follow residual amount  07/24/13  Yes Vassie Lollarlos Madera, MD  ondansetron (ZOFRAN) 4 MG tablet Take 1 tablet (4 mg total) by mouth every 8 (eight) hours as needed for nausea or vomiting. 07/16/13  Yes Leroy SeaPrashant K Singh, MD  pantoprazole sodium (PROTONIX) 40 mg/20 mL PACK Place 20 mLs (40 mg total) into feeding tube daily at 12 noon. 07/16/13  Yes Leroy SeaPrashant K Singh, MD  risperiDONE (RISPERDAL M-TABS) 0.5 MG disintegrating tablet Take 1 tablet (0.5 mg total) by mouth 2 (two) times daily. 07/16/13  Yes Leroy SeaPrashant K Singh, MD  valproic acid (DEPAKENE) 250 MG capsule Take 2 capsules (500 mg total) by mouth 3 (three) times daily. 07/19/13  Yes Catarina Hartshornavid Tat, MD  Valproic Acid (DEPAKENE) 250 MG/5ML SYRP syrup Take 500 mg by mouth 3 (three) times daily.   Yes Historical Provider, MD  Water For Irrigation, Sterile (FREE WATER) SOLN Place 200 mLs into feeding tube every 6 (six) hours. 07/24/13  Yes Vassie Lollarlos Madera, MD    Physical Exam: Filed Vitals:   07/25/13 0130 07/25/13 0215 07/25/13 0245 07/25/13 0315  BP: 129/105 112/95 123/96 153/108  Pulse: 113 113 118 117  Temp:      TempSrc:      Resp: 18 20 24 24   SpO2: 100% 100% 100% 100%     General:  Well-developed and nourished.  Eyes: Anicteric no pallor.  ENT: No discharge from the ears eyes nose mouth.  Neck: No mass felt.  Cardiovascular: S1-S2 heard.  Respiratory: No rhonchi or crepitations.  Abdomen: Soft nontender PEG tube in place.  Skin: No rash.  Musculoskeletal: No edema.  Psychiatric: Alert awake follows commands.  Neurologic: Patient has jerky movements of the right upper extremity. Follows commands. Decreased movement of the lower extremities.  Labs on Admission:  Basic Metabolic Panel:  Recent Labs Lab 07/20/13 0510 07/21/13 0500 07/22/13 0500 07/23/13 0415 07/24/13 2304  NA 141 138 141 144 144  K 4.5 3.9 3.5* 3.4* 3.4*  CL 105 104 105 105 104  CO2 28 27 26 28 29   GLUCOSE 85 85 80 92 94  BUN 5* 6 5* 5* 6  CREATININE 0.53 0.61 0.55 0.56 0.49*   CALCIUM 8.1* 7.7* 8.1* 8.2* 8.3*  MG  --   --   --  1.9  --    Liver Function Tests:  Recent Labs Lab 07/20/13 0510 07/21/13 0500 07/23/13 1110 07/24/13 2304  AST 22 22 15  46*  ALT 10 11 9 16   ALKPHOS 78 76 79 82  BILITOT 0.3 0.3 0.3 0.4  PROT 4.8* 4.5* 4.9* 5.5*  ALBUMIN 2.2* 2.0* 2.5* 2.7*    Recent Labs Lab 07/20/13 0510 07/24/13 2304  LIPASE 15 11    Recent Labs Lab 07/23/13 1110  AMMONIA 23   CBC:  Recent Labs Lab 07/20/13 0510 07/21/13 0500 07/22/13 0500 07/23/13 0825 07/24/13 2304  WBC 6.8 6.4 6.8 8.0 7.5  NEUTROABS  --   --   --   --  5.1  HGB 10.0* 9.8* 9.7* 10.1* 11.3*  HCT 30.6* 29.4* 29.5* 30.4* 34.4*  MCV 95.9 96.4 96.1 96.5 97.2  PLT 136* 146* 151 167 176   Cardiac Enzymes: No results found for this basename: CKTOTAL, CKMB, CKMBINDEX, TROPONINI,  in the last 168 hours  BNP (last 3 results) No results found for this basename: PROBNP,  in the last 8760 hours CBG:  Recent Labs Lab 07/23/13 1932 07/24/13 0053 07/24/13 0518 07/24/13 1148 07/24/13 1613  GLUCAP 89 92 85 90 92    Radiological Exams on Admission: Ct Head Wo Contrast  07/23/2013   CLINICAL DATA:  Mental status change.  EXAM: CT HEAD WITHOUT CONTRAST  TECHNIQUE: Contiguous axial images were obtained from the base of the skull through the vertex without intravenous contrast.  COMPARISON:  MR C SPINE W/O CM dated 06/22/2013  FINDINGS: No mass. No hydrocephalus. No hemorrhage. Orbits are intact. Paranasal sinuses are clear where visualized. Mastoids are clear. No acute bony abnormality.  IMPRESSION: No acute or focal abnormality.   Electronically Signed   By: Maisie Fus  Register   On: 07/23/2013 09:58   Dg Chest Port 1 View  07/25/2013   CLINICAL DATA:  Fever and seizure  EXAM: PORTABLE CHEST - 1 VIEW  COMPARISON:  Prior radiograph from 07/20/2013  FINDINGS: Cardiac and mediastinal silhouettes are stable in size and contour, and remain within normal limits. Right-sided PICC catheter  has been removed.  Lungs are normally inflated. Previously seen bilateral pleural effusions are no longer visualized. There is persistent patchy left basilar opacity, suspicious for possible infiltrate or atelectasis. No other focal airspace disease identified. No pulmonary edema or pneumothorax.  Degenerative changes noted within the thoracic spine.  IMPRESSION: 1. Persistent patchy left basilar opacity. While this finding may in part reflect residual atelectasis, possible infiltrate could be considered given the history of fever. 2. Interval resolution in bilateral pleural effusions.   Electronically Signed   By: Rise Mu M.D.   On: 07/25/2013 01:38     Assessment/Plan Principal Problem:   Seizures Active Problems:   HCAP (healthcare-associated pneumonia)   Tachycardia   Anemia   1. Seizures - patient had another seizure after coming to the ER. This lasted for less than a minute. At this time neurologist has recommended to increase Keppra to 1500 mg twice a day and Depakote to 750 milligrams 3 times a day. Closely observe. 2. Possible health care associated pneumonia - patient has been placed on vancomycin and cefepime. Closely observe respiratory status. 3. Sinus tachycardia - patient does have history of sinus tachycardia. Check TSH. Continue IV fluids and closely observe. 4. Anemia - follow CBC. 5. PEG tube feeding.    Code Status: Full code.   Disposition Plan: Admit to inpatient.    Vianny Schraeder N. [facility] Pager [phone].  If 7PM-7AM, please contact night-coverage [email or web address] Password [facility] 07/25/2013, 3:53 AM

## 2013-07-25 NOTE — Consult Note (Signed)
Reason for Consult:Seizure Referring Physician: Toniann Fail  CC: Seizures  HPI: Joan [name] is an 53 y.o. female that was discharged from the hospital on 07/24/2013 after a protracted illness.  After significant decline was admitted in December of last year. At that time was felt to be in status epilepticus. Aborting her seizures required high doses of antiepileptic medications.  Patient did not immediately return to baseline and there was felt to be some degree of encephalopathy related to the prolonged seizure activity,. Initial abnormalities noted on MR imaging at that time were showing improvement. Due to concern over the etiology of the seizure activity patient had a full work up that included a LP to rule out encephalitis. All titers returned normal. Patient was showing clinical improvement and was therefore discharged to be followed up on an outpatient basis. Patient did not return to baseline though and was readmitted. With readmission serum titers for encephalitic etiologies were obtained and the patient was empirically treated with steroids and Solumedrol. Hospitalization was complicated by infection.  Patient was discharged on 2/25 and returned tonight after 10 minutes of seizure activity was noted at the facility.  After being given 500mg  of Keppra further seizure activity was noted in the ED.    Past Medical History  Diagnosis Date  . Peripheral neuropathy   . Fibromyalgia   . Hypertension   . Anxiety   . Catatonia   . Seizures     Past Surgical History  Procedure Laterality Date  . Gastric bypass    . Abdominal hysterectomy    . Jejunostomy N/A 06/25/2013    Procedure:  OPEN JEJUNOSTOMY FEEDING TUBE ;  Surgeon: Cherylynn Ridges, MD;  Location: [facility] OR;  Service: General;  Laterality: N/A;  . Laparotomy N/A 07/02/2013    Procedure: EXPLORATORY LAPAROTOMY with revision of jejunostomy feeding tube.;  Surgeon: Cherylynn Ridges, MD;  Location: [facility] OR;  Service: General;  Laterality: N/A;  . Bowel  resection N/A 07/02/2013    Procedure: SMALL BOWEL RESECTION;  Surgeon: Cherylynn Ridges, MD;  Location: [facility] OR;  Service: General;  Laterality: N/A;    Family History  Problem Relation Age of Onset  . Hypertension Mother   . Diabetes Father     Social History:  reports that she has never smoked. She does not have any smokeless tobacco history on file. She reports that she does not drink alcohol. Her drug history is not on file.  Allergies  Allergen Reactions  . Morphine And Related Other (See Comments)    REACTION: Causes pain  . Paroxetine Hcl Other (See Comments)    REACTION:  unknown  . Penicillins Other (See Comments)    REACTION: Makes her body hurt.  . Sertraline Hcl Other (See Comments)    REACTION:  unknown    Medications: I have reviewed the patient's current medications. Prior to Admission:  Current outpatient prescriptions: acetaminophen (TYLENOL) 325 MG tablet, Take 650 mg by mouth every 4 (four) hours as needed for mild pain or fever., Disp: chlorhexidine (PERIDEX) 0.12 % solution, Use as directed 15 mLs in the mouth or throat 2 (two) times daily., Disp: , Rfl: ;  ferrous sulfate 325 (65 FE) MG tablet, Take 1 tablet (325 mg total) by mouth 2 (two) times daily with a meal., Disp: , Rfl: 3 levETIRAcetam (KEPPRA) 1000 MG tablet, Take 1,000 mg by mouth 2 (two) times daily., Disp: , Rfl: ;   loperamide (IMODIUM) 2 MG capsule, Take 4 mg by mouth 3 (three) times  daily as needed for diarrhea or loose stools. , Disp: metoprolol (LOPRESSOR) 50 MG tablet, Take 1.5 tablets (75 mg total) by mouth 2 (two) times daily., Disp: 60 tablet, Rfl: 0 mirtazapine (REMERON SOL-TAB) 15 MG disintegrating tablet, Take 15 mg by mouth at bedtime., Disp: , Rfl: ;   Nutritional Supplements (FEEDING SUPPLEMENT, VITAL AF 1.2 CAL,) LIQD, Place 1,000 mLs into feeding tube continuous. Slowly advance rate to a goal of 60cc/hr; follow residual amount, Disp: 1000 mL, Rfl: 0 ondansetron (ZOFRAN) 4 MG tablet, Take 1  tablet (4 mg total) by mouth every 8 (eight) hours as needed for nausea or vomiting., Disp: 20 tablet, Rfl: 0;   pantoprazole sodium (PROTONIX) 40 mg/20 mL PACK, Place 20 mLs (40 mg total) into feeding tube daily at 12 noon., Disp: 30 each, Rfl: ;   risperiDONE (RISPERDAL M-TABS) 0.5 MG disintegrating tablet, Take 1 tablet (0.5 mg total) by mouth 2 (two) times daily., Disp: , Rfl:  valproic acid (DEPAKENE) 250 MG capsule, Take 2 capsules (500 mg total) by mouth 3 (three) times daily., Disp: 90 capsule, Valproic Acid (DEPAKENE) 250 MG/5ML SYRP syrup, Take 500 mg by mouth 3 (three) times daily., Disp: , Rfl: ;   Water For Irrigation, Sterile (FREE WATER) SOLN, Place 200 mLs into feeding tube every 6 (six) hours., Disp: , Rfl:   ROS: History obtained from the patient  General ROS: reports just not feeling well Psychological ROS: negative for - behavioral disorder, hallucinations, memory difficulties, mood swings or suicidal ideation Ophthalmic ROS: negative for - blurry vision, double vision, eye pain or loss of vision ENT ROS: negative for - epistaxis, nasal discharge, oral lesions, sore throat, tinnitus or vertigo Allergy and Immunology ROS: negative for - hives or itchy/watery eyes Hematological and Lymphatic ROS: negative for - bleeding problems, bruising or swollen lymph nodes Endocrine ROS: negative for - galactorrhea, hair pattern changes, polydipsia/polyuria or temperature intolerance Respiratory ROS: negative for - cough, hemoptysis, shortness of breath or wheezing Cardiovascular ROS: negative for - chest pain, dyspnea on exertion, edema or irregular heartbeat Gastrointestinal ROS: negative for - abdominal pain, diarrhea, hematemesis, nausea/vomiting or stool incontinence Genito-Urinary ROS: negative for - dysuria, hematuria, incontinence or urinary frequency/urgency Musculoskeletal ROS: leg pain Neurological ROS: as noted in HPI Dermatological ROS: negative for rash and skin lesion  changes  Physical Examination: Blood pressure 153/108, pulse 117, temperature 100 F (37.8 C), temperature source Rectal, resp. rate 24, height 5' 2.99" (1.6 m), weight 77.4 kg (170 lb 10.2 oz), SpO2 100.00%.  Neurologic Examination Mental Status: Alert and awake.  Not fully oriented.  Speech fluent without evidence of aphasia.  Able to follow simple commands without difficulty. Cranial Nerves: II: Discs flat bilaterally; Visual fields grossly normal, pupils equal, round, reactive to light and accommodation [name],IV, VI: ptosis not present, extra-ocular motions intact bilaterally V,VII: smile symmetric, reports a cold sensation on the right side of her face VIII: hearing normal bilaterally IX,X: gag reflex reduced XI: bilateral shoulder shrug XII: midline tongue extension Motor: Is able to lift the left arm off the bed a small degree and maintain.  Makes a a weak hand grip.  Unable to lift the right arm off the bed and maintain.  Unable to perform a hand grip.  Unable to lift the legs off the bed.  Does wiggle the toes bilaterally Sensory: Reports a cold sensation on the right side of the body Deep Tendon Reflexes: 2+ in the upper extremities.  Due to pain will not allow me to  perform in the lower extremities.  Plantars: Right: mute   Left: mute Cerebellar: Unable to test Gait: Unable to test CV: pulses palpable throughout     Laboratory Studies:   Basic Metabolic Panel:  Recent Labs Lab 07/20/13 0510 07/21/13 0500 07/22/13 0500 07/23/13 0415 07/24/13 2304  NA 141 138 141 144 144  K 4.5 3.9 3.5* 3.4* 3.4*  CL 105 104 105 105 104  CO2 28 27 26 28 29   GLUCOSE 85 85 80 92 94  BUN 5* 6 5* 5* 6  CREATININE 0.53 0.61 0.55 0.56 0.49*  CALCIUM 8.1* 7.7* 8.1* 8.2* 8.3*  MG  --   --   --  1.9  --     Liver Function Tests:  Recent Labs Lab 07/20/13 0510 07/21/13 0500 07/23/13 1110 07/24/13 2304  AST 22 22 15  46*  ALT 10 11 9 16   ALKPHOS 78 76 79 82  BILITOT 0.3 0.3 0.3  0.4  PROT 4.8* 4.5* 4.9* 5.5*  ALBUMIN 2.2* 2.0* 2.5* 2.7*    Recent Labs Lab 07/20/13 0510 07/24/13 2304  LIPASE 15 11    Recent Labs Lab 07/23/13 1110  AMMONIA 23    CBC:  Recent Labs Lab 07/20/13 0510 07/21/13 0500 07/22/13 0500 07/23/13 0825 07/24/13 2304  WBC 6.8 6.4 6.8 8.0 7.5  NEUTROABS  --   --   --   --  5.1  HGB 10.0* 9.8* 9.7* 10.1* 11.3*  HCT 30.6* 29.4* 29.5* 30.4* 34.4*  MCV 95.9 96.4 96.1 96.5 97.2  PLT 136* 146* 151 167 176    Cardiac Enzymes: No results found for this basename: CKTOTAL, CKMB, CKMBINDEX, TROPONINI,  in the last 168 hours  BNP: No components found with this basename: POCBNP,   CBG:  Recent Labs Lab 07/24/13 0053 07/24/13 0518 07/24/13 1148 07/24/13 1613 07/25/13 0420  GLUCAP 92 85 90 92 82    Microbiology: Results for orders placed during the hospital encounter of 07/17/13  CULTURE, BLOOD (ROUTINE X 2)     Status: None   Collection Time    07/17/13  3:35 AM      Result Value Ref Range Status   Specimen Description BLOOD RIGHT PICC LINE   Final   Special Requests BOTTLES DRAWN AEROBIC AND ANAEROBIC 10CC   Final   Culture  Setup Time     Final   Value: 07/17/2013 08:11     Performed at [facility]   Culture     Final   Value: NO GROWTH 5 DAYS     Performed at [facility]   Report Status 07/23/2013 FINAL   Final  CULTURE, BLOOD (ROUTINE X 2)     Status: None   Collection Time    07/17/13  3:52 AM      Result Value Ref Range Status   Specimen Description BLOOD LEFT ARM   Final   Special Requests BOTTLES DRAWN AEROBIC ONLY 10CC   Final   Culture  Setup Time     Final   Value: 07/17/2013 08:11     Performed at [facility]   Culture     Final   Value: NO GROWTH 5 DAYS     Performed at [facility]   Report Status 07/23/2013 FINAL   Final  URINE CULTURE     Status: None   Collection Time    07/17/13  4:27 AM      Result Value Ref Range Status   Specimen Description URINE,  CATHETERIZED   Final   Special Requests Normal   Final   Culture  Setup Time     Final   Value: 07/16/2013 03:22     [facility]   Colony Count     Final   Value: NO GROWTH     [facility]   Culture     Final   Value: NO GROWTH     [facility]   Report Status 07/18/2013 FINAL   Final  CLOSTRIDIUM DIFFICILE BY PCR     Status: None   Collection Time    07/17/13 10:30 AM      Result Value Ref Range Status   C difficile by pcr NEGATIVE  NEGATIVE Final  URINE CULTURE     Status: None   Collection Time    07/20/13  2:15 AM      Result Value Ref Range Status   Specimen Description URINE, CATHETERIZED   Final   Special Requests Cipro Immunocompromised   Final   Culture  Setup Time     Final   Value: 07/20/2013 12:13     [facility]   Colony Count     Final   Value: NO GROWTH     [facility]   Culture     Final   Value: NO GROWTH     [facility] at [facility]   Report Status 07/21/2013 FINAL   Final    Coagulation Studies: No results found for this basename: LABPROT, INR,  in the last 72 hours  Urinalysis:  Recent Labs Lab 07/24/13 0128 07/25/13 0013  COLORURINE YELLOW YELLOW  LABSPEC 1.014 1.015  PHURINE 6.0 7.0  GLUCOSEU NEGATIVE NEGATIVE  HGBUR NEGATIVE LARGE*  BILIRUBINUR NEGATIVE NEGATIVE  KETONESUR NEGATIVE NEGATIVE  PROTEINUR NEGATIVE NEGATIVE  UROBILINOGEN 1.0 2.0*  NITRITE NEGATIVE NEGATIVE  LEUKOCYTESUR NEGATIVE TRACE*    Lipid Panel:  No results found for this basename: chol, trig, hdl, cholhdl, vldl, ldlcalc    HgbA1C:  Lab Results  Component Value Date   HGBA1C 4.7 07/12/2013    Urine Drug Screen:     Component Value Date/Time   LABOPIA NONE DETECTED 06/13/2013 2111   COCAINSCRNUR NONE DETECTED 06/13/2013 2111   LABBENZ NONE DETECTED 06/13/2013 2111   AMPHETMU NONE DETECTED 06/13/2013 2111   THCU NONE DETECTED 06/13/2013 2111    LABBARB NONE DETECTED 06/13/2013 2111    Alcohol Level: No results found for this basename: ETH,  in the last 168 hours  Other results: EKG: sinus tachycardia at 137 bpm.  Imaging: Ct Head Wo Contrast  07/23/2013   CLINICAL DATA:  Mental status change.  EXAM: CT HEAD WITHOUT CONTRAST  TECHNIQUE: Contiguous axial images were obtained from the base of the skull through the vertex without intravenous contrast.  COMPARISON:  MR C SPINE W/O CM dated 06/22/2013  FINDINGS: No mass. No hydrocephalus. No hemorrhage. Orbits are intact. Paranasal sinuses are clear where visualized. Mastoids are clear. No acute bony abnormality.  IMPRESSION: No acute or focal abnormality.   Electronically Signed   By: Maisie Fushomas  Register   On: 07/23/2013 09:58   Dg Chest Port 1 View  07/25/2013   CLINICAL DATA:  Fever and seizure  EXAM: PORTABLE CHEST - 1 VIEW  COMPARISON:  Prior radiograph from 07/20/2013  FINDINGS: Cardiac and mediastinal silhouettes are stable in size and contour, and remain within normal limits. Right-sided PICC catheter has been removed.  Lungs are normally inflated. Previously seen bilateral pleural effusions are no longer visualized. There is persistent patchy left basilar opacity, suspicious for possible infiltrate or atelectasis. No other focal airspace disease identified. No pulmonary edema or pneumothorax.  Degenerative changes noted within the thoracic spine.  IMPRESSION: 1. Persistent patchy left basilar opacity. While this finding may in part reflect residual atelectasis, possible infiltrate could be considered given the history of fever. 2. Interval resolution in bilateral pleural effusions.   Electronically Signed   By: Rise Mu M.D.   On: 07/25/2013 01:38     Assessment/Plan: 53 year old female with a history of seizures on Depakote and Keppra.  Discharged from the hospital today and noted to have a seizure at her accepting facility.  Had further seizures in the ED despite additional  Keppra doses.   Patient has a history of status epilepticus but does not appear to be in status at this time with her being able to follow commands and answer questions at baseline.  Last valproic acid level was 52. Last head CT was performed on 2/24 and reviewed.  It showed no acute changes.    Recommendations: 1.  Increase maintenance Keppra to 1500mg  BID 2.  Increase maintenance Depakote to 750mg  TID.  Would give 750mg  Depacon now. 3.  Seizure precautions 4.  EEG 5.  Depakote level on 07/26/13 in AM  Thana Farr, MD [facility] [phone] 07/25/2013, 4:30 AM

## 2013-07-26 ENCOUNTER — Inpatient Hospital Stay (HOSPITAL_COMMUNITY): Payer: Medicare Other

## 2013-07-26 LAB — BASIC METABOLIC PANEL
BUN: 7 mg/dL (ref 6–23)
CHLORIDE: 106 meq/L (ref 96–112)
CO2: 25 meq/L (ref 19–32)
Calcium: 8.3 mg/dL — ABNORMAL LOW (ref 8.4–10.5)
Creatinine, Ser: 0.42 mg/dL — ABNORMAL LOW (ref 0.50–1.10)
GFR calc Af Amer: >90 mL/min (ref >90)
GFR calc non Af Amer: >90 mL/min (ref >90)
Glucose, Bld: 88 mg/dL (ref 70–99)
POTASSIUM: 3.6 meq/L — AB (ref 3.7–5.3)
SODIUM: 141 meq/L (ref 137–147)

## 2013-07-26 LAB — GLUCOSE, CAPILLARY
GLUCOSE-CAPILLARY: 103 mg/dL — AB (ref 70–99)
GLUCOSE-CAPILLARY: 90 mg/dL (ref 70–99)
GLUCOSE-CAPILLARY: 95 mg/dL (ref 70–99)
Glucose-Capillary: 97 mg/dL (ref 70–99)
Glucose-Capillary: 89 mg/dL (ref 70–99)

## 2013-07-26 MED ORDER — LORAZEPAM 2 MG/ML IJ SOLN
1.0000 mg | INTRAMUSCULAR | Status: AC
  Administered 2013-07-26: 1 mg via INTRAVENOUS
  Filled 2013-07-26: qty 1

## 2013-07-26 MED ORDER — LORAZEPAM 2 MG/ML IJ SOLN
1.0000 mg | INTRAMUSCULAR | Status: AC
  Administered 2013-07-26: 1 mg via INTRAVENOUS

## 2013-07-26 MED ORDER — SODIUM CHLORIDE 0.9 % IV SOLN
100.0000 mg | Freq: Two times a day (BID) | INTRAVENOUS | Status: DC
  Administered 2013-07-26: 100 mg via INTRAVENOUS
  Filled 2013-07-26 (×4): qty 10

## 2013-07-26 MED ORDER — SODIUM CHLORIDE 0.9 % IV SOLN
200.0000 mg | INTRAVENOUS | Status: AC
  Administered 2013-07-26: 200 mg via INTRAVENOUS
  Filled 2013-07-26: qty 20

## 2013-07-26 MED ORDER — WHITE PETROLATUM GEL
Status: AC
  Administered 2013-07-26: 0.2
  Filled 2013-07-26: qty 5

## 2013-07-26 MED ORDER — LORAZEPAM 2 MG/ML IJ SOLN
INTRAMUSCULAR | Status: AC
  Administered 2013-07-26: 2 mg
  Filled 2013-07-26: qty 1

## 2013-07-26 MED ORDER — VALPROATE SODIUM 500 MG/5ML IV SOLN
500.0000 mg | INTRAVENOUS | Status: AC
  Administered 2013-07-26: 500 mg via INTRAVENOUS
  Filled 2013-07-26: qty 5

## 2013-07-26 MED ORDER — VALPROIC ACID 250 MG/5ML PO SYRP
750.0000 mg | ORAL_SOLUTION | Freq: Three times a day (TID) | ORAL | Status: DC
  Administered 2013-07-26 – 2013-07-30 (×12): 750 mg
  Filled 2013-07-26 (×15): qty 15

## 2013-07-26 NOTE — Progress Notes (Signed)
[facility] Progress Note [place] TEAM 1 - Stepdown/ICU TEAM   Joan [name] VWU:[id number] DOB: 15-Oct-1960 DOA: 07/24/2013 PCP: [name], MD  Brief narrative: Joan [name] is a 53 y.o. female presenting on 07/24/2013 with female history of seizures and who was recently admitted for encephalopathy and was treated for possible autoimmune encephalitis, had complicated course following J-tube placement with bowel obstruction was brought from the nursing home after patient was found to have generalized tonic-clonic seizures which lasted for around 10 minutes. Patient was post ictal after the seizures and was initially found to be lethargic in the ER. Patient also was found to be mildly febrile. Later patient became more alert awake. Chest x-ray shows possible pneumonic process. In the ER patient was given 500 mg of IV Keppra and was started on vancomycin and cefepime for seizures. Patient did have almost a year after the Keppra was started. I have consulted Dr. Thad Ranger from neurology. As per the nursing home staff patient did not have any nausea vomiting abdominal pain chest pain or shortness of breath.   Subjective: Sleepy but answering questions with shaking and nodding head  Assessment/Plan:  Recurrent Seizures  -Pt has a PMH of seizures and catatonia.  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 Pt received diatech catheter on 06/20/2013 placement and received and finished plasmapheresis-  Possible HCAP - cont Vanc and Cefepime- repeat CXR today  - J tube/ Gastroparesis  - cont Vital and free water  Hx of Anxiety/depression and delirium  -as outpt,  pt on Remeron and risperdal  -attempt to avoid benzodiazepines and narcotics   HTN  -metoprolol  75 mg twice a day  -HR  better controlled   Malnutrition (severe protein calorie malnutrition) with history of Gastric Bypass/partial gastrectomy  -Gen Surg placed J-tube  and underwent revision of j tube with exploratory laparotomy on 2/3 and small bowel resection.  -She was able to take a dysphagia 1 diet by mouth - will resume when alert enough -continue tube feedings   Bacteremia  -for Klebsiella pneumoniae bacteremia  -Plan was to continue ciprofloxacin through 07/26/2013 -  Thrombocytopenia  -improved to normal  Pleural effusions  -noted to have resolved on CXR performed yesterday  Code Status: Full code Family Communication: none Disposition Plan: follow in SDU  Consultants: Neuro  Procedures: None  Antibiotics: Anti-infectives   Start     Dose/Rate Route Frequency Ordered Stop   07/25/13 1800  vancomycin (VANCOCIN) IVPB 1000 mg/200 mL premix     1,000 mg 200 mL/hr over 60 Minutes Intravenous Every 12 hours 07/25/13 0555     07/25/13 1000  ceFEPIme (MAXIPIME) 1 g in dextrose 5 % 50 mL IVPB     1 g 100 mL/hr over 30 Minutes Intravenous Every 8 hours 07/25/13 0555     07/25/13 0230  ceFEPIme (MAXIPIME) 2 g in dextrose 5 % 50 mL IVPB     2 g 100 mL/hr over 30 Minutes Intravenous  Once 07/25/13 0219 07/25/13 0338   07/25/13 0230  vancomycin (VANCOCIN) 1,500 mg in sodium chloride 0.9 % 500 mL IVPB     1,500 mg 250 mL/hr over 120 Minutes Intravenous  Once 07/25/13 0220 07/25/13 0540       DVT prophylaxis: SCDs  Objective: Filed Weights   07/25/13 0315 07/26/13 0500  Weight: 77.4 kg (  170 lb 10.2 oz) 83.4 kg (183 lb 13.8 oz)   Blood pressure 120/64, pulse 99, temperature 98.9 F (37.2 C), temperature source Axillary, resp. rate 21, height 5' 2.99" (1.6 m), weight 83.4 kg (183 lb 13.8 oz), SpO2 100.00%.  Intake/Output Summary (Last 24 hours) at 07/26/13 1534 Last data filed at 07/26/13 1448  Gross per 24 hour  Intake   4103 ml  Output   1375 ml  Net   2728 ml      Exam: General: No acute respiratory distress- lethargic and will not open eyes Lungs: Clear to auscultation bilaterally without wheezes or crackles Cardiovascular: Regular rate and rhythm without murmur gallop or rub normal S1 and S2 Abdomen: Nontender, nondistended, soft, bowel sounds positive, no rebound, no ascites, no appreciable mass- J tube intact Extremities: No significant cyanosis, clubbing, or edema bilateral lower extremities  Data Reviewed: Basic Metabolic Panel:  Recent Labs Lab 07/22/13 0500 07/23/13 0415 07/24/13 2304 07/25/13 0642 07/26/13 0233  NA 141 144 144 144 141  K 3.5* 3.4* 3.4* 3.4* 3.6*  CL 105 105 104 109 106  CO2 26 28 29 26 25   GLUCOSE 80 92 94 84 88  BUN 5* 5* 6 6 7   CREATININE 0.55 0.56 0.49* 0.40* 0.42*  CALCIUM 8.1* 8.2* 8.3* 7.7* 8.3*  MG  --  1.9  --   --   --    Liver Function Tests:  Recent Labs Lab 07/20/13 0510 07/21/13 0500 07/23/13 1110 07/24/13 2304 07/25/13 0642  AST 22 22 15  46* 24  ALT 10 11 9 16 11   ALKPHOS 78 76 79 82 68  BILITOT 0.3 0.3 0.3 0.4 0.3  PROT 4.8* 4.5* 4.9* 5.5* 4.6*  ALBUMIN 2.2* 2.0* 2.5* 2.7* 2.2*    Recent Labs Lab 07/20/13 0510 07/24/13 2304  LIPASE 15 11    Recent Labs Lab 07/23/13 1110  AMMONIA 23   CBC:  Recent Labs Lab 07/21/13 0500 07/22/13 0500 07/23/13 0825 07/24/13 2304 07/25/13 0642  WBC 6.4 6.8 8.0 7.5 11.4*  NEUTROABS  --   --   --  5.1 8.8*  HGB 9.8* 9.7* 10.1* 11.3* 10.4*  HCT 29.4* 29.5* 30.4* 34.4* 31.4*  MCV 96.4 96.1 96.5 97.2 98.1  PLT 146* 151 167 176 154   Cardiac Enzymes: No results found for this basename: CKTOTAL, CKMB, CKMBINDEX, TROPONINI,  in the last 168 hours BNP (last 3 results) No results found for this basename: PROBNP,  in the last 8760 hours CBG:  Recent Labs Lab 07/25/13 1643 07/26/13 0021 07/26/13 0535 07/26/13 0822 07/26/13 1246  GLUCAP 91 95 103* 97 90    Recent Results (from the past 240 hour(s))  CULTURE, BLOOD (ROUTINE  X 2)     Status: None   Collection Time    07/17/13  3:35 AM      Result Value Ref Range Status   Specimen Description BLOOD RIGHT PICC LINE   Final   Special Requests BOTTLES DRAWN AEROBIC AND ANAEROBIC 10CC   Final   Culture  Setup Time     Final   Value: 07/17/2013 08:11     Performed at [facility]   Culture     Final   Value: NO GROWTH 5 DAYS     Performed at [facility]   Report Status 07/23/2013 FINAL   Final  CULTURE, BLOOD (ROUTINE X 2)     Status: None   Collection Time    07/17/13  3:52 AM  Result Value Ref Range Status   Specimen Description BLOOD LEFT ARM   Final   Special Requests BOTTLES DRAWN AEROBIC ONLY 10CC   Final   Culture  Setup Time     Final   Value: 07/17/2013 08:11     Performed at [facility]   Culture     Final   Value: NO GROWTH 5 DAYS     Performed at [facility]   Report Status 07/23/2013 FINAL   Final  URINE CULTURE     Status: None   Collection Time    07/17/13  4:27 AM      Result Value Ref Range Status   Specimen Description URINE, CATHETERIZED   Final   Special Requests Normal   Final   Culture  Setup Time     Final   Value: 07/16/2013 03:22     Performed at [facility] Count     Final   Value: NO GROWTH     Performed at [facility]   Culture     Final   Value: NO GROWTH     Performed at [facility]   Report Status 07/18/2013 FINAL   Final  CLOSTRIDIUM DIFFICILE BY PCR     Status: None   Collection Time    07/17/13 10:30 AM      Result Value Ref Range Status   C difficile by pcr NEGATIVE  NEGATIVE Final  URINE CULTURE     Status: None   Collection Time    07/20/13  2:15 AM      Result Value Ref Range Status   Specimen Description URINE, CATHETERIZED   Final   Special Requests Cipro Immunocompromised   Final   Culture  Setup Time     Final   Value: 07/20/2013 12:13     Performed at [facility] Count     Final   Value: NO GROWTH      Performed at [facility]   Culture     Final   Value: NO GROWTH     Performed at [facility]   Report Status 07/21/2013 FINAL   Final  URINE CULTURE     Status: None   Collection Time    07/24/13  1:28 AM      Result Value Ref Range Status   Specimen Description URINE, CATHETERIZED   Final   Special Requests NONE   Final   Culture  Setup Time     Final   Value: 07/24/2013 08:19     Performed at [facility] Count     Final   Value: NO GROWTH     Performed at [facility]   Culture     Final   Value: NO GROWTH     Performed at [facility]   Report Status 07/25/2013 FINAL   Final  CULTURE, BLOOD (ROUTINE X 2)     Status: None   Collection Time    07/25/13  2:38 AM      Result Value Ref Range Status   Specimen Description BLOOD RIGHT ARM   Final   Special Requests BOTTLES DRAWN AEROBIC ONLY [facility]   Final   Culture  Setup Time     Final   Value: 07/25/2013 09:54     Performed at [facility]   Culture     Final   Value:  BLOOD CULTURE RECEIVED NO GROWTH TO DATE CULTURE WILL BE HELD FOR 5 DAYS BEFORE ISSUING A FINAL NEGATIVE REPORT     [facility]   Report Status PENDING   Incomplete  CULTURE, BLOOD (ROUTINE X 2)     Status: None   Collection Time    07/25/13  2:45 AM      Result Value Ref Range Status   Specimen Description BLOOD RIGHT HAND   Final   Special Requests     Final   Value: BOTTLES DRAWN AEROBIC AND ANAEROBIC 6CC AEROBIC 4CC ANAEROBIC   Culture  Setup Time     Final   Value: 07/25/2013 09:54     [facility]   Culture     Final   Value:        BLOOD CULTURE RECEIVED NO GROWTH TO DATE CULTURE WILL BE HELD FOR 5 DAYS BEFORE ISSUING A FINAL NEGATIVE REPORT     [facility]   Report Status PENDING   Incomplete  MRSA PCR SCREENING     Status: None   Collection Time    07/25/13  5:39 AM      Result Value Ref Range Status   MRSA by PCR  NEGATIVE  NEGATIVE Final   Comment:            The GeneXpert MRSA Assay (FDA     approved for NASAL specimens     only), is one component of a     comprehensive MRSA colonization     surveillance program. It is not     intended to diagnose MRSA     infection nor to guide or     monitor treatment for     MRSA infections.     Studies:  Recent x-ray studies have been reviewed in detail by the Attending Physician  Scheduled Meds:  Scheduled Meds: . ceFEPime (MAXIPIME) IV  1 g Intravenous Q8H  . chlorhexidine  15 mL Mouth/Throat BID  . ferrous sulfate  325 mg Oral BID WC  . free water  200 mL Per Tube Q6H  . lacosamide (VIMPAT) IV  100 mg Intravenous Q12H  . levETIRAcetam  1,500 mg Intravenous Q12H  . metoprolol  75 mg Oral BID  . mirtazapine  15 mg Oral QHS  . pantoprazole sodium  40 mg Per Tube Q1200  . risperiDONE  0.5 mg Oral BID  . sodium chloride  3 mL Intravenous Q12H  . valproic acid  750 mg Oral TID  . vancomycin  1,000 mg Intravenous Q12H   Continuous Infusions: . feeding supplement (VITAL AF 1.2 CAL) 1,000 mL (07/26/13 0527)    Time spent on care of this patient: >35 min   Calvert CantorIZWAN,Othman Masur, MD  [facility] Office  [phone] Pager - Text Page per Loretha StaplerAmion as per below:  On-Call/Text Page:      [email or web address]      password [email or web address]  If 7PM-7AM, please contact night-coverage [email or web address] Password [email or web address] 07/26/2013, 3:34 PM   LOS: 2 days

## 2013-07-26 NOTE — Progress Notes (Signed)
Pt aroused by RN hourly. Alert X 2with right hand repetitive twitching on awakening. No twitching  "Why do you do that" "It's my nerves". Ativan given @ 11am per order with repeat EEG.

## 2013-07-26 NOTE — Progress Notes (Signed)
STAT eeg completed 

## 2013-07-26 NOTE — Progress Notes (Addendum)
NEURO HOSPITALIST PROGRESS NOTE   SUBJECTIVE:                                                                                                                        When asked how she is, she repeats " I am trying" multiple times.  She is able to tell me she is in the hospital and her age but unable to answer questions such as --why she is in the hospital or if the rhythmic jerking of her right arm is normal for her. Slow to answer questions.   OBJECTIVE:                                                                                                                           Vital signs in last 24 hours: Temp:  [97.8 F (36.6 C)-98.7 F (37.1 C)] 97.8 F (36.6 C) (02/27 0931) Pulse Rate:  [74-115] 115 (02/27 0931) Resp:  [14-27] 20 (02/27 0931) BP: (100-153)/(37-88) 141/37 mmHg (02/27 0931) SpO2:  [99 %-100 %] 100 % (02/27 0931) Weight:  [83.4 kg (183 lb 13.8 oz)] 83.4 kg (183 lb 13.8 oz) (02/27 0500)  Intake/Output from previous day: 02/26 0701 - 02/27 0700 In: 3010 [I.V.:2213; NG/GT:382; IV Piggyback:415] Out: 1000 [Urine:1000] Intake/Output this shift: Total I/O In: 900 [NG/GT:900] Out: 200 [Urine:200] Nutritional status:    Past Medical History  Diagnosis Date  . Peripheral neuropathy   . Fibromyalgia   . Hypertension   . Anxiety   . Catatonia   . Seizures      Neurologic Exam:  Mental Status: Alert, oriented to hospital but then will continue to say "I am trying"  Slow to respond and unable to answer complex question.  Speech fluent without evidence of aphasia.  Able to follow simple commands without difficulty. Cranial Nerves: II: Visual fields grossly normal, pupils equal, round, reactive to light and accommodation [name],IV, VI: ptosis not present, extra-ocular motions intact bilaterally V,VII: smile symmetric, facial light touch sensation normal bilaterally VIII: hearing normal bilaterally IX,X: gag reflex present XI:  bilateral shoulder shrug XII: midline tongue extension without atrophy or fasciculations  Motor: Able to move her left arm antigravity with weak grip, right arm shows rhythmic jerking which patient is unable to  stop and when I place my hand on her arm the movement continues. Able to lift both legs off the bed antigravity but unable to overcome resistance.  Tone and bulk:normal tone throughout; no atrophy noted Sensory: Pinprick and light touch intact throughout, bilaterally Deep Tendon Reflexes:  Right: Upper Extremity   Left: Upper extremity   biceps (C-5 to C-6) 2/4   biceps (C-5 to C-6) 2/4 tricep (C7) 2/4    triceps (C7) 2/4 Brachioradialis (C6) 2/4  Brachioradialis (C6) 2/4  Lower Extremity Lower Extremity  quadriceps (L-2 to L-4) 1/4   quadriceps (L-2 to L-4) 1/4 Achilles (S1) 0/4   Achilles (S1) 0/4  Plantars: Mute bilaterally    Lab Results: Basic Metabolic Panel:  Recent Labs Lab 07/22/13 0500 07/23/13 0415 07/24/13 2304 07/25/13 0642 07/26/13 0233  NA 141 144 144 144 141  K 3.5* 3.4* 3.4* 3.4* 3.6*  CL 105 105 104 109 106  CO2 26 28 29 26 25   GLUCOSE 80 92 94 84 88  BUN 5* 5* 6 6 7   CREATININE 0.55 0.56 0.49* 0.40* 0.42*  CALCIUM 8.1* 8.2* 8.3* 7.7* 8.3*  MG  --  1.9  --   --   --     Liver Function Tests:  Recent Labs Lab 07/20/13 0510 07/21/13 0500 07/23/13 1110 07/24/13 2304 07/25/13 0642  AST 22 22 15  46* 24  ALT 10 11 9 16 11   ALKPHOS 78 76 79 82 68  BILITOT 0.3 0.3 0.3 0.4 0.3  PROT 4.8* 4.5* 4.9* 5.5* 4.6*  ALBUMIN 2.2* 2.0* 2.5* 2.7* 2.2*    Recent Labs Lab 07/20/13 0510 07/24/13 2304  LIPASE 15 11    Recent Labs Lab 07/23/13 1110  AMMONIA 23    CBC:  Recent Labs Lab 07/21/13 0500 07/22/13 0500 07/23/13 0825 07/24/13 2304 07/25/13 0642  WBC 6.4 6.8 8.0 7.5 11.4*  NEUTROABS  --   --   --  5.1 8.8*  HGB 9.8* 9.7* 10.1* 11.3* 10.4*  HCT 29.4* 29.5* 30.4* 34.4* 31.4*  MCV 96.4 96.1 96.5 97.2 98.1  PLT 146* 151 167  176 154    Cardiac Enzymes: No results found for this basename: CKTOTAL, CKMB, CKMBINDEX, TROPONINI,  in the last 168 hours  Lipid Panel: No results found for this basename: CHOL, TRIG, HDL, CHOLHDL, VLDL, LDLCALC,  in the last 168 hours  CBG:  Recent Labs Lab 07/25/13 0621 07/25/13 1643 07/26/13 0021 07/26/13 0535 07/26/13 0822  GLUCAP 77 91 95 103* 97    Microbiology: Results for orders placed during the hospital encounter of 07/24/13  MRSA PCR SCREENING     Status: None   Collection Time    07/25/13  5:39 AM      Result Value Ref Range Status   MRSA by PCR NEGATIVE  NEGATIVE Final   Comment:            The GeneXpert MRSA Assay (FDA     approved for NASAL specimens     only), is one component of a     comprehensive MRSA colonization     surveillance program. It is not     intended to diagnose MRSA     infection nor to guide or     monitor treatment for     MRSA infections.    Coagulation Studies: No results found for this basename: LABPROT, INR,  in the last 72 hours  Imaging: Dg Abd 1 View  07/25/2013   CLINICAL DATA:  Nausea and vomiting.  EXAM: ABDOMEN - 1 VIEW  COMPARISON:  CT ABD/PELVIS W CM dated 07/20/2013; DG ABD 2 VIEWS dated 07/19/2013  FINDINGS: The visualized bowel gas pattern is unremarkable. Scattered air and stool filled loops of colon are seen; no abnormal dilatation of small bowel loops is seen to suggest small bowel obstruction. No free intra-abdominal air is identified, though evaluation for free air is limited on a single supine view. Clips are noted within the right upper quadrant, reflecting prior cholecystectomy.  The patient's J-tube is seen overlying the lower abdomen, with an associated mildly distended loop of jejunum.  The visualized osseous structures are within normal limits; the sacroiliac joints are unremarkable in appearance.  IMPRESSION: Unremarkable bowel gas pattern; no free intra-abdominal air seen. Mild jejunal distention in  association with the patient's J-tube is is thought to be transient in nature.   Electronically Signed   By: Roanna Raider M.D.   On: 07/25/2013 06:39   Dg Chest Port 1 View  07/25/2013   CLINICAL DATA:  Fever and seizure  EXAM: PORTABLE CHEST - 1 VIEW  COMPARISON:  Prior radiograph from 07/20/2013  FINDINGS: Cardiac and mediastinal silhouettes are stable in size and contour, and remain within normal limits. Right-sided PICC catheter has been removed.  Lungs are normally inflated. Previously seen bilateral pleural effusions are no longer visualized. There is persistent patchy left basilar opacity, suspicious for possible infiltrate or atelectasis. No other focal airspace disease identified. No pulmonary edema or pneumothorax.  Degenerative changes noted within the thoracic spine.  IMPRESSION: 1. Persistent patchy left basilar opacity. While this finding may in part reflect residual atelectasis, possible infiltrate could be considered given the history of fever. 2. Interval resolution in bilateral pleural effusions.   Electronically Signed   By: Rise Mu M.D.   On: 07/25/2013 01:38       MEDICATIONS                                                                                                                        Scheduled: . ceFEPime (MAXIPIME) IV  1 g Intravenous Q8H  . chlorhexidine  15 mL Mouth/Throat BID  . ferrous sulfate  325 mg Oral BID WC  . free water  200 mL Per Tube Q6H  . levETIRAcetam  1,500 mg Intravenous Q12H  . LORazepam  1 mg Intravenous STAT  . metoprolol  75 mg Oral BID  . mirtazapine  15 mg Oral QHS  . pantoprazole sodium  40 mg Per Tube Q1200  . risperiDONE  0.5 mg Oral BID  . sodium chloride  3 mL Intravenous Q12H  . valproate sodium  500 mg Intravenous STAT  . valproic acid  750 mg Oral TID  . vancomycin  1,000 mg Intravenous Q12H    ASSESSMENT/PLAN:  53 year old female with a history of seizures on Depakote and Keppra.  Recently readmitted for seizure activity noted while in facility. On Keppra 1500 mg bid and recently increased Depakote to 750 mg TID. On exam today patient shows rhythmic jerking of right arm and slow to respond.  Concern is for complex partial seizure.  STAT EEG has been ordered, bolus of 500 mg Depakote ordered and 1 mg Ativan given.  Will make further adjustments based on EEG.   Addendum: patient showed sharp discharges over the left hemisphere and continues to show rhythmic right arm movement after bolus of 500 mg Depakote.  Will add bolus of Vimpat 200 mg now and 100 mg BID.   Assessment and plan discussed with with attending physician and they are in agreement.    Felicie MornDavid Smith PA-C [facility] [phone]  07/26/2013, 10:47 AM

## 2013-07-26 NOTE — Procedures (Signed)
EEG report.  Brief clinical history: 53 y/o with repetitive jerking movements right arm, concerning for right focal motor seizures.  Technique: this is a 17 channel routine scalp EEG performed at the bedside with bipolar and monopolar montages arranged in accordance to the international 10/20 system of electrode placement. One channel was dedicated to EKG recording.  The study was performed during wakefulness and drowsiness. No activating procedures performed.  Description:In the wakeful state, the best background consisted of a medium amplitude, posterior dominant, well sustained, symmetric and reactive 9 Hz rhythm. Drowsiness demonstrated dropout of the alpha rhythm. No focal or generalized epileptiform discharges noted.  No slowing seen.  EKG showed sinus rhythm.  Impression: this is a normal awake and drowsy EEG. Please, be aware that a normal EEG does not exclude the possibility of epilepsy.  Clinical correlation is advised.  [name] Portelasvaldo Camilo, MD

## 2013-07-27 DIAGNOSIS — G9340 Encephalopathy, unspecified: Secondary | ICD-10-CM

## 2013-07-27 DIAGNOSIS — R569 Unspecified convulsions: Secondary | ICD-10-CM

## 2013-07-27 LAB — BASIC METABOLIC PANEL
BUN: 7 mg/dL (ref 6–23)
CALCIUM: 8.2 mg/dL — AB (ref 8.4–10.5)
CHLORIDE: 107 meq/L (ref 96–112)
CO2: 27 mEq/L (ref 19–32)
CREATININE: 0.36 mg/dL — AB (ref 0.50–1.10)
GFR calc non Af Amer: >90 mL/min (ref >90)
GLUCOSE: 76 mg/dL (ref 70–99)
POTASSIUM: 3.6 meq/L — AB (ref 3.7–5.3)
Sodium: 142 mEq/L (ref 137–147)

## 2013-07-27 LAB — GLUCOSE, CAPILLARY
GLUCOSE-CAPILLARY: 86 mg/dL (ref 70–99)
Glucose-Capillary: 80 mg/dL (ref 70–99)
Glucose-Capillary: 99 mg/dL (ref 70–99)

## 2013-07-27 MED ORDER — LACOSAMIDE 200 MG/20ML IV SOLN
150.0000 mg | Freq: Two times a day (BID) | INTRAVENOUS | Status: DC
  Administered 2013-07-27 – 2013-07-30 (×7): 150 mg via INTRAVENOUS
  Filled 2013-07-27 (×14): qty 15

## 2013-07-27 MED ORDER — SODIUM CHLORIDE 0.9 % IV SOLN
100.0000 mg | Freq: Once | INTRAVENOUS | Status: AC
  Administered 2013-07-27: 100 mg via INTRAVENOUS
  Filled 2013-07-27: qty 10

## 2013-07-27 MED ORDER — LORAZEPAM BOLUS VIA INFUSION
2.0000 mg | Freq: Once | INTRAVENOUS | Status: DC
  Administered 2013-07-27: 2 mg via INTRAVENOUS
  Filled 2013-07-27: qty 2

## 2013-07-27 MED ORDER — LORAZEPAM 2 MG/ML IJ SOLN
2.0000 mg | Freq: Once | INTRAMUSCULAR | Status: AC
  Administered 2013-07-27: 2 mg via INTRAVENOUS
  Filled 2013-07-27: qty 1

## 2013-07-27 MED ORDER — FERROUS SULFATE 300 (60 FE) MG/5ML PO SYRP
300.0000 mg | ORAL_SOLUTION | Freq: Two times a day (BID) | ORAL | Status: DC
  Administered 2013-07-27 – 2013-07-30 (×7): 300 mg
  Filled 2013-07-27 (×8): qty 5

## 2013-07-27 NOTE — Progress Notes (Signed)
NEURO HOSPITALIST PROGRESS NOTE   SUBJECTIVE:                                                                                                                        She continues having rhythmic, intermittent jerking movements right arm as well as tremor-like movements left arm despite receiving IV lorazepam 1 mg x 2 doses yesterday as well extra 500 mg IV Depacon, and loading dose 200 mg Vimpat. EEG yesterday revealed no evidence of frank epileptiform discharges or electrographic seizures. On keppra 1,500 mg BID,  Depakote 750 mg TID, and vimpat 100 mg added yesterday. 2/25/ levetiracetam and valproic acid levels therapeutic 19.2 and 52.3 respectively.   OBJECTIVE:                                                                                                                           Vital signs in last 24 hours: Temp:  [97.8 F (36.6 C)-98.9 F (37.2 C)] 98.7 F (37.1 C) (02/28 0815) Pulse Rate:  [93-115] 111 (02/28 0815) Resp:  [13-21] 13 (02/28 0815) BP: (90-141)/(37-90) 125/90 mmHg (02/28 0815) SpO2:  [100 %] 100 % (02/28 0815) Weight:  [82.5 kg (181 lb 14.1 oz)] 82.5 kg (181 lb 14.1 oz) (02/28 0102)  Intake/Output from previous day: 02/27 0701 - 02/28 0700 In: 4070 [NG/GT:2960; IV Piggyback:1030] Out: 1325 [Urine:1325] Intake/Output this shift:   Nutritional status:    Past Medical History  Diagnosis Date  . Peripheral neuropathy   . Fibromyalgia   . Hypertension   . Anxiety   . Catatonia   . Seizures     Neurologic Exam:  Mental Status:  Alert, oriented to hospital. No dysarthria or dysphasia. Able to follow simple commands without difficulty.  Cranial Nerves:  II: Visual fields grossly normal, pupils equal, round, reactive to light and accommodation  [name],IV, VI: ptosis not present, extra-ocular motions intact bilaterally  V,VII: smile symmetric, facial light touch sensation normal bilaterally  VIII: hearing normal  bilaterally  IX,X: gag reflex present  XI: bilateral shoulder shrug  XII: midline tongue extension without atrophy or fasciculations  Motor:  Able to move her left arm antigravity with weak grip, right arm shows rhythmic jerking which patient is unable to stop and  when I place my hand on her arm the movement continues. Able to lift both legs off the bed antigravity but unable to overcome resistance.  Tone and bulk:normal tone throughout; no atrophy noted  Sensory: Pinprick and light touch intact throughout, bilaterally  Deep Tendon Reflexes:  Right: Upper Extremity Left: Upper extremity  biceps (C-5 to C-6) 2/4 biceps (C-5 to C-6) 2/4  tricep (C7) 2/4 triceps (C7) 2/4  Brachioradialis (C6) 2/4 Brachioradialis (C6) 2/4  Lower Extremity Lower Extremity  quadriceps (L-2 to L-4) 1/4 quadriceps (L-2 to L-4) 1/4  Achilles (S1) 0/4 Achilles (S1) 0/4  Plantars:  Mute bilaterally   Lab Results: No results found for this basename: cbc, bmp, coags, chol, tri, ldl, hga1c   Lipid Panel No results found for this basename: CHOL, TRIG, HDL, CHOLHDL, VLDL, LDLCALC,  in the last 72 hours  Studies/Results: Dg Chest Port 1 View  07/26/2013   CLINICAL DATA:  History of pneumonia, weakness, lethargy  EXAM: PORTABLE CHEST - 1 VIEW  COMPARISON:  Portable chest x-ray of 07/25/2013  FINDINGS: There is more opacity at both lung bases most consistent with developing pneumonia. Some of the opacity may be due to atelectasis and possible small effusions. Cardiomegaly is stable. There are degenerative changes throughout the thoracic spine.  IMPRESSION: Increase in opacity at the lung bases suspicious for pneumonia. Cannot exclude small effusions.   Electronically Signed   By: Dwyane DeePaul  Barry M.D.   On: 07/26/2013 16:15    MEDICATIONS                                                                                                                       I have reviewed the patient's current medications.  ASSESSMENT/PLAN:                                                                                                            Right focal motor SE?[name]. Did not see anything impressive on EEG yesterday but it is worth to mention that focal seizures may not be evident on EEG. Will give 2 mg IV ativan now. Extra dose IV vimpat 100 mg now and increase maintenance dose to 300 mg daily. Continue depakote. Will follow up.  [name] Portelasvaldo Vester Titsworth, MD [facility] [phone]  07/27/2013, 9:30 AM

## 2013-07-27 NOTE — Progress Notes (Signed)
[facility] Progress Note [place] TEAM 1 - Stepdown/ICU TEAM   Candida Peeling ZOX:[id number] DOB: [birth year] DOA: 07/24/2013 PCP: [name], MD  Brief narrative: Joan [name] is a 53 y.o. female presenting on 07/24/2013 with female history of seizures and who was recently admitted for encephalopathy and was treated for possible autoimmune encephalitis, had complicated course following J-tube placement with bowel obstruction was brought from the nursing home after patient was found to have generalized tonic-clonic seizures which lasted for around 10 minutes. Patient was post ictal after the seizures and was initially found to be lethargic in the ER. Patient also was found to be mildly febrile. Later patient became more alert awake. Chest x-ray shows possible pneumonic process. In the ER patient was given 500 mg of IV Keppra and was started on vancomycin and cefepime for seizures. Patient did have almost a year after the Keppra was started. I have consulted Dr. Thad Ranger from neurology. As per the nursing home staff patient did not have any nausea vomiting abdominal pain chest pain or shortness of breath.   Subjective: Lethargic today  Assessment/Plan:  Recurrent Seizures  -Pt has a PMH of seizures and catatonia.  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 Pt received diatech catheter on 06/20/2013 placement and received and finished plasmapheresis-  Possible HCAP - cont Vanc and Cefepime- repeat CXR does reveal b/l infiltrates increased from prior xray  - J tube/ Gastroparesis  - cont Vital and free water  Hx of Anxiety/depression and delirium  -as outpt,  pt on Remeron and risperdal  -attempt to avoid benzodiazepines and narcotics   HTN  -metoprolol  75 mg twice a day  -HR better controlled    Malnutrition (severe protein calorie malnutrition) with history of Gastric Bypass/partial gastrectomy  -Gen Surg placed J-tube  and underwent revision of j tube with exploratory laparotomy on 2/3 and small bowel resection.  -She was able to take a dysphagia 1 diet by mouth - will resume when alert enough -continue tube feedings   Bacteremia  -for Klebsiella pneumoniae bacteremia  -Plan was to continue ciprofloxacin through 07/26/2013 -  Thrombocytopenia  -improved to normal  Pleural effusions  -noted to have resolved on CXR performed yesterday  Code Status: Full code Family Communication: none Disposition Plan: follow in SDU  Consultants: Neuro  Procedures: None  Antibiotics: Anti-infectives   Start     Dose/Rate Route Frequency Ordered Stop   07/25/13 1800  vancomycin (VANCOCIN) IVPB 1000 mg/200 mL premix     1,000 mg 200 mL/hr over 60 Minutes Intravenous Every 12 hours 07/25/13 0555     07/25/13 1000  ceFEPIme (MAXIPIME) 1 g in dextrose 5 % 50 mL IVPB     1 g 100 mL/hr over 30 Minutes Intravenous Every 8 hours 07/25/13 0555     07/25/13 0230  ceFEPIme (MAXIPIME) 2 g in dextrose 5 % 50 mL IVPB     2 g 100 mL/hr over 30 Minutes Intravenous  Once 07/25/13 0219 07/25/13 0338   07/25/13 0230  vancomycin (VANCOCIN) 1,500 mg in sodium chloride 0.9 % 500 mL IVPB     1,500 mg 250 mL/hr over 120 Minutes Intravenous  Once 07/25/13 0220 07/25/13 0540       DVT prophylaxis: SCDs  Objective: Filed Weights   07/25/13 0315 07/26/13 0500 07/27/13 0102  Weight: 77.4 kg (170 lb  10.2 oz) 83.4 kg (183 lb 13.8 oz) 82.5 kg (181 lb 14.1 oz)   Blood pressure 111/63, pulse 95, temperature 98.4 F (36.9 C), temperature source Axillary, resp. rate 7, height 5' 2.99" (1.6 m), weight 82.5 kg (181 lb 14.1 oz), SpO2 100.00%.  Intake/Output Summary (Last 24 hours) at 07/27/13 1240 Last data filed at 07/27/13 1212  Gross per 24 hour  Intake   2455 ml  Output   1775 ml  Net    680 ml      Exam: General: No acute respiratory distress- lethargic and will not open eyes Lungs: Clear to auscultation bilaterally without wheezes or crackles Cardiovascular: Regular rate and rhythm without murmur gallop or rub normal S1 and S2 Abdomen: Nontender, nondistended, soft, bowel sounds positive, no rebound, no ascites, no appreciable mass- J tube intact Extremities: No significant cyanosis, clubbing, or edema bilateral lower extremities  Data Reviewed: Basic Metabolic Panel:  Recent Labs Lab 07/23/13 0415 07/24/13 2304 07/25/13 0642 07/26/13 0233 07/27/13 0932  NA 144 144 144 141 142  K 3.4* 3.4* 3.4* 3.6* 3.6*  CL 105 104 109 106 107  CO2 28 29 26 25 27   GLUCOSE 92 94 84 88 76  BUN 5* 6 6 7 7   CREATININE 0.56 0.49* 0.40* 0.42* 0.36*  CALCIUM 8.2* 8.3* 7.7* 8.3* 8.2*  MG 1.9  --   --   --   --    Liver Function Tests:  Recent Labs Lab 07/21/13 0500 07/23/13 1110 07/24/13 2304 07/25/13 0642  AST 22 15 46* 24  ALT 11 9 16 11   ALKPHOS 76 79 82 68  BILITOT 0.3 0.3 0.4 0.3  PROT 4.5* 4.9* 5.5* 4.6*  ALBUMIN 2.0* 2.5* 2.7* 2.2*    Recent Labs Lab 07/24/13 2304  LIPASE 11    Recent Labs Lab 07/23/13 1110  AMMONIA 23   CBC:  Recent Labs Lab 07/21/13 0500 07/22/13 0500 07/23/13 0825 07/24/13 2304 07/25/13 0642  WBC 6.4 6.8 8.0 7.5 11.4*  NEUTROABS  --   --   --  5.1 8.8*  HGB 9.8* 9.7* 10.1* 11.3* 10.4*  HCT 29.4* 29.5* 30.4* 34.4* 31.4*  MCV 96.4 96.1 96.5 97.2 98.1  PLT 146* 151 167 176 154   Cardiac Enzymes: No results found for this basename: CKTOTAL, CKMB, CKMBINDEX, TROPONINI,  in the last 168 hours BNP (last 3 results) No results found for this basename: PROBNP,  in the last 8760 hours CBG:  Recent Labs Lab 07/26/13 0822 07/26/13 1246 07/26/13 1645 07/27/13 0120 07/27/13 0619  GLUCAP 97 90 89 80 99    Recent Results (from the past 240 hour(s))  URINE CULTURE     Status: None   Collection Time    07/20/13  2:15 AM       Result Value Ref Range Status   Specimen Description URINE, CATHETERIZED   Final   Special Requests Cipro Immunocompromised   Final   Culture  Setup Time     Final   Value: 07/20/2013 12:13     Performed at [facility] Count     Final   Value: NO GROWTH     Performed at [facility]   Culture     Final   Value: NO GROWTH     Performed at [facility]   Report Status 07/21/2013 FINAL   Final  URINE CULTURE     Status: None   Collection Time    07/24/13  1:28 AM  Result Value Ref Range Status   Specimen Description URINE, CATHETERIZED   Final   Special Requests NONE   Final   Culture  Setup Time     Final   Value: 07/24/2013 08:19     Performed at [facility]   Colony Count     Final   Value: NO GROWTH     Performed at [facility]   Culture     Final   Value: NO GROWTH     Performed at [facility]   Report Status 07/25/2013 FINAL   Final  CULTURE, BLOOD (ROUTINE X 2)     Status: None   Collection Time    07/25/13  2:38 AM      Result Value Ref Range Status   Specimen Description BLOOD RIGHT ARM   Final   Special Requests BOTTLES DRAWN AEROBIC ONLY [facility]   Final   Culture  Setup Time     Final   Value: 07/25/2013 09:54     Performed at [facility]   Culture     Final   Value:        BLOOD CULTURE RECEIVED NO GROWTH TO DATE CULTURE WILL BE HELD FOR 5 DAYS BEFORE ISSUING A FINAL NEGATIVE REPORT     Performed at [facility]   Report Status PENDING   Incomplete  CULTURE, BLOOD (ROUTINE X 2)     Status: None   Collection Time    07/25/13  2:45 AM      Result Value Ref Range Status   Specimen Description BLOOD RIGHT HAND   Final   Special Requests     Final   Value: BOTTLES DRAWN AEROBIC AND ANAEROBIC 6CC AEROBIC 4CC ANAEROBIC   Culture  Setup Time     Final   Value: 07/25/2013 09:54     Performed at [facility]   Culture     Final   Value:        BLOOD CULTURE RECEIVED NO GROWTH TO  DATE CULTURE WILL BE HELD FOR 5 DAYS BEFORE ISSUING A FINAL NEGATIVE REPORT     Performed at [facility]   Report Status PENDING   Incomplete  MRSA PCR SCREENING     Status: None   Collection Time    07/25/13  5:39 AM      Result Value Ref Range Status   MRSA by PCR NEGATIVE  NEGATIVE Final   Comment:            The GeneXpert MRSA Assay (FDA     approved for NASAL specimens     only), is one component of a     comprehensive MRSA colonization     surveillance program. It is not     intended to diagnose MRSA     infection nor to guide or     monitor treatment for     MRSA infections.     Studies:  Recent x-ray studies have been reviewed in detail by the Attending Physician  Scheduled Meds:  Scheduled Meds: . ceFEPime (MAXIPIME) IV  1 g Intravenous Q8H  . chlorhexidine  15 mL Mouth/Throat BID  . ferrous sulfate  325 mg Oral BID WC  . free water  200 mL Per Tube Q6H  . lacosamide (VIMPAT) IV  150 mg Intravenous Q12H  . levETIRAcetam  1,500 mg Intravenous Q12H  . metoprolol  75 mg Oral BID  . mirtazapine  15 mg Oral QHS  .  pantoprazole sodium  40 mg Per Tube Q1200  . risperiDONE  0.5 mg Oral BID  . sodium chloride  3 mL Intravenous Q12H  . Valproic Acid  750 mg Per Tube 3 times per day  . vancomycin  1,000 mg Intravenous Q12H   Continuous Infusions: . feeding supplement (VITAL AF 1.2 CAL) 1,000 mL (07/27/13 0126)    Time spent on care of this patient: >35 min   Calvert CantorIZWAN,Laketa Sandoz, MD  [facility] Office  [phone] Pager - Text Page per Loretha StaplerAmion as per below:  On-Call/Text Page:      [email or web address]      password [email or web address]  If 7PM-7AM, please contact night-coverage [email or web address] Password [email or web address] 07/27/2013, 12:40 PM   LOS: 3 days

## 2013-07-28 LAB — CBC
HEMATOCRIT: 29.6 % — AB (ref 36.0–46.0)
Hemoglobin: 9.8 g/dL — ABNORMAL LOW (ref 12.0–15.0)
MCH: 32.2 pg (ref 26.0–34.0)
MCHC: 33.1 g/dL (ref 30.0–36.0)
MCV: 97.4 fL (ref 78.0–100.0)
Platelets: 143 10*3/uL — ABNORMAL LOW (ref 150–400)
RBC: 3.04 MIL/uL — ABNORMAL LOW (ref 3.87–5.11)
RDW: 19.5 % — ABNORMAL HIGH (ref 11.5–15.5)
WBC: 8.1 10*3/uL (ref 4.0–10.5)

## 2013-07-28 LAB — GLUCOSE, CAPILLARY
GLUCOSE-CAPILLARY: 93 mg/dL (ref 70–99)
GLUCOSE-CAPILLARY: 98 mg/dL (ref 70–99)
GLUCOSE-CAPILLARY: 83 mg/dL (ref 70–99)
Glucose-Capillary: 100 mg/dL — ABNORMAL HIGH (ref 70–99)
Glucose-Capillary: 85 mg/dL (ref 70–99)

## 2013-07-28 LAB — BASIC METABOLIC PANEL
BUN: 7 mg/dL (ref 6–23)
CO2: 27 mEq/L (ref 19–32)
CREATININE: 0.33 mg/dL — AB (ref 0.50–1.10)
Calcium: 8.1 mg/dL — ABNORMAL LOW (ref 8.4–10.5)
Chloride: 108 mEq/L (ref 96–112)
GFR calc non Af Amer: >90 mL/min (ref >90)
Glucose, Bld: 91 mg/dL (ref 70–99)
Potassium: 3.3 mEq/L — ABNORMAL LOW (ref 3.7–5.3)
Sodium: 144 mEq/L (ref 137–147)

## 2013-07-28 LAB — MAGNESIUM: MAGNESIUM: 1.9 mg/dL (ref 1.5–2.5)

## 2013-07-28 LAB — VANCOMYCIN, TROUGH: Vancomycin Tr: 13 ug/mL (ref 10.0–20.0)

## 2013-07-28 MED ORDER — SODIUM CHLORIDE 0.9 % IV SOLN
1250.0000 mg | Freq: Two times a day (BID) | INTRAVENOUS | Status: DC
Start: 2013-07-29 — End: 2013-07-30
  Administered 2013-07-29 – 2013-07-30 (×4): 1250 mg via INTRAVENOUS
  Filled 2013-07-28 (×5): qty 1250

## 2013-07-28 MED ORDER — POTASSIUM CHLORIDE 20 MEQ/15ML (10%) PO LIQD: 40.0000 meq | Freq: Two times a day (BID) | ORAL | Status: DC

## 2013-07-28 MED ORDER — POTASSIUM CHLORIDE 20 MEQ/15ML (10%) PO LIQD
40.0000 meq | Freq: Two times a day (BID) | ORAL | Status: DC
  Administered 2013-07-28 – 2013-07-29 (×3): 40 meq via ORAL
  Filled 2013-07-28 (×4): qty 30

## 2013-07-28 NOTE — Progress Notes (Addendum)
ANTIBIOTIC CONSULT NOTE - FOLLOW UP  Pharmacy Consult for Cefepime/Vancomycin Indication: pneumonia  Allergies  Allergen Reactions  . Morphine And Related Other (See Comments)    REACTION: Causes pain  . Paroxetine Hcl Other (See Comments)    REACTION:  unknown  . Penicillins Other (See Comments)    REACTION: Makes her body hurt.  . Sertraline Hcl Other (See Comments)    REACTION:  unknown    Patient Measurements: Height: 5' 2.99" (160 cm) Weight: 181 lb 14.1 oz (82.5 kg) IBW/kg (Calculated) : 52.38  Vital Signs: Temp: 98.6 F (37 C) (03/01 1200) Temp src: Axillary (03/01 1200) BP: 120/65 mmHg (03/01 0800) Pulse Rate: 120 (03/01 0800) Intake/Output from previous day: 02/28 0701 - 03/01 0700 In: 2623 [I.V.:3; NG/GT:1840; IV Piggyback:660] Out: 1450 [Urine:1450] Intake/Output from this shift: Total I/O In: 750 [NG/GT:500; IV Piggyback:250] Out: 350 [Urine:350]  Labs:  Recent Labs  07/26/13 0233 07/27/13 0932 07/28/13 0914  WBC  --   --  8.1  HGB  --   --  9.8*  PLT  --   --  143*  CREATININE 0.42* 0.36* 0.33*   Estimated Creatinine Clearance: 84.6 ml/min (by C-G formula based on Cr of 0.33). No results found for this basename: VANCOTROUGH, Leodis Binet, VANCORANDOM, GENTTROUGH, GENTPEAK, GENTRANDOM, TOBRATROUGH, TOBRAPEAK, TOBRARND, AMIKACINPEAK, AMIKACINTROU, AMIKACIN,  in the last 72 hours   Microbiology: Recent Results (from the past 720 hour(s))  CULTURE, BLOOD (ROUTINE X 2)     Status: None   Collection Time    07/03/13  6:40 AM      Result Value Ref Range Status   Specimen Description BLOOD LEFT HAND   Final   Special Requests BOTTLES DRAWN AEROBIC ONLY 2CC   Final   Culture  Setup Time     Final   Value: 07/03/2013 13:19     Performed at [facility]   Culture     Final   Value: NO GROWTH 5 DAYS     Performed at [facility]   Report Status 07/09/2013 FINAL   Final  CULTURE, BLOOD (ROUTINE X 2)     Status: None   Collection Time     07/03/13  6:55 AM      Result Value Ref Range Status   Specimen Description BLOOD LEFT ARM   Final   Special Requests BOTTLES DRAWN AEROBIC ONLY 1CC   Final   Culture  Setup Time     Final   Value: 07/03/2013 13:19     Performed at [facility]   Culture     Final   Value: NO GROWTH 5 DAYS     Performed at [facility]   Report Status 07/09/2013 FINAL   Final  CLOSTRIDIUM DIFFICILE BY PCR     Status: None   Collection Time    07/06/13 10:27 AM      Result Value Ref Range Status   C difficile by pcr NEGATIVE  NEGATIVE Final  URINE CULTURE     Status: None   Collection Time    07/10/13  8:15 AM      Result Value Ref Range Status   Specimen Description URINE, CATHETERIZED   Final   Special Requests NONE   Final   Culture  Setup Time     Final   Value: 07/10/2013 09:13     Performed at [facility] Count     Final   Value: 50,000 COLONIES/ML  Performed at [facility]     Final   Value: KLEBSIELLA PNEUMONIAE     YEAST     Performed at [facility]   Report Status 07/13/2013 FINAL   Final   Organism ID, Bacteria KLEBSIELLA PNEUMONIAE   Final  CLOSTRIDIUM DIFFICILE BY PCR     Status: None   Collection Time    07/10/13  8:33 AM      Result Value Ref Range Status   C difficile by pcr NEGATIVE  NEGATIVE Final  CULTURE, BLOOD (ROUTINE X 2)     Status: None   Collection Time    07/10/13 10:55 AM      Result Value Ref Range Status   Specimen Description BLOOD LEFT ARM   Final   Special Requests BOTTLES DRAWN AEROBIC ONLY 8CC   Final   Culture  Setup Time     Final   Value: 07/10/2013 16:23     Performed at [facility]   Culture     Final   Value: KLEBSIELLA PNEUMONIAE     Note: Gram Stain Report Called to,Read Back By and Verified With: HOLLY CHURCH 07/11/13 AT 0330 RIDK     Performed at [facility]   Report Status 07/13/2013 FINAL   Final   Organism ID, Bacteria KLEBSIELLA PNEUMONIAE   Final   MRSA PCR SCREENING     Status: None   Collection Time    07/10/13 12:55 PM      Result Value Ref Range Status   MRSA by PCR NEGATIVE  NEGATIVE Final   Comment:            The GeneXpert MRSA Assay (FDA     approved for NASAL specimens     only), is one component of a     comprehensive MRSA colonization     surveillance program. It is not     intended to diagnose MRSA     infection nor to guide or     monitor treatment for     MRSA infections.  CULTURE, BLOOD (ROUTINE X 2)     Status: None   Collection Time    07/12/13 10:40 AM      Result Value Ref Range Status   Specimen Description BLOOD LEFT FOREARM   Final   Special Requests BOTTLES DRAWN AEROBIC AND ANAEROBIC 10CC   Final   Culture  Setup Time     Final   Value: 07/12/2013 14:02     Performed at [facility]   Culture     Final   Value: NO GROWTH 5 DAYS     Performed at [facility]   Report Status 07/18/2013 FINAL   Final  CULTURE, BLOOD (ROUTINE X 2)     Status: None   Collection Time    07/12/13 10:50 AM      Result Value Ref Range Status   Specimen Description BLOOD LEFT ARM   Final   Special Requests BOTTLES DRAWN AEROBIC AND ANAEROBIC 10CC   Final   Culture  Setup Time     Final   Value: 07/12/2013 14:02     Performed at [facility]   Culture     Final   Value: NO GROWTH 5 DAYS     Performed at [facility]   Report Status 07/18/2013 FINAL   Final  CULTURE, BLOOD (ROUTINE X 2)     Status: None   Collection Time  07/17/13  3:35 AM      Result Value Ref Range Status   Specimen Description BLOOD RIGHT PICC LINE   Final   Special Requests BOTTLES DRAWN AEROBIC AND ANAEROBIC 10CC   Final   Culture  Setup Time     Final   Value: 07/17/2013 08:11     Performed at [facility]   Culture     Final   Value: NO GROWTH 5 DAYS     Performed at [facility]   Report Status 07/23/2013 FINAL   Final  CULTURE, BLOOD (ROUTINE X 2)     Status: None   Collection Time     07/17/13  3:52 AM      Result Value Ref Range Status   Specimen Description BLOOD LEFT ARM   Final   Special Requests BOTTLES DRAWN AEROBIC ONLY 10CC   Final   Culture  Setup Time     Final   Value: 07/17/2013 08:11     Performed at [facility]   Culture     Final   Value: NO GROWTH 5 DAYS     Performed at [facility]   Report Status 07/23/2013 FINAL   Final  URINE CULTURE     Status: None   Collection Time    07/17/13  4:27 AM      Result Value Ref Range Status   Specimen Description URINE, CATHETERIZED   Final   Special Requests Normal   Final   Culture  Setup Time     Final   Value: 07/16/2013 03:22     Performed at [facility] Count     Final   Value: NO GROWTH     Performed at [facility]   Culture     Final   Value: NO GROWTH     Performed at [facility]   Report Status 07/18/2013 FINAL   Final  CLOSTRIDIUM DIFFICILE BY PCR     Status: None   Collection Time    07/17/13 10:30 AM      Result Value Ref Range Status   C difficile by pcr NEGATIVE  NEGATIVE Final  URINE CULTURE     Status: None   Collection Time    07/20/13  2:15 AM      Result Value Ref Range Status   Specimen Description URINE, CATHETERIZED   Final   Special Requests Cipro Immunocompromised   Final   Culture  Setup Time     Final   Value: 07/20/2013 12:13     Performed at [facility] Count     Final   Value: NO GROWTH     Performed at [facility]   Culture     Final   Value: NO GROWTH     Performed at [facility]   Report Status 07/21/2013 FINAL   Final  URINE CULTURE     Status: None   Collection Time    07/24/13  1:28 AM      Result Value Ref Range Status   Specimen Description URINE, CATHETERIZED   Final   Special Requests NONE   Final   Culture  Setup Time     Final   Value: 07/24/2013 08:19     Performed at [facility] Count     Final   Value: NO GROWTH     Performed at  [facility]  Culture     Final   Value: NO GROWTH     [facility]   Report Status 07/25/2013 FINAL   Final  CULTURE, BLOOD (ROUTINE X 2)     Status: None   Collection Time    07/25/13  2:38 AM      Result Value Ref Range Status   Specimen Description BLOOD RIGHT ARM   Final   Special Requests BOTTLES DRAWN AEROBIC ONLY [facility]9CC   Final   Culture  Setup Time     Final   Value: 07/25/2013 09:54     [facility]   Culture     Final   Value:        BLOOD CULTURE RECEIVED NO GROWTH TO DATE CULTURE WILL BE HELD FOR 5 DAYS BEFORE ISSUING A FINAL NEGATIVE REPORT     [facility]   Report Status PENDING   Incomplete  CULTURE, BLOOD (ROUTINE X 2)     Status: None   Collection Time    07/25/13  2:45 AM      Result Value Ref Range Status   Specimen Description BLOOD RIGHT HAND   Final   Special Requests     Final   Value: BOTTLES DRAWN AEROBIC AND ANAEROBIC 6CC AEROBIC 4CC ANAEROBIC   Culture  Setup Time     Final   Value: 07/25/2013 09:54     [facility]   Culture     Final   Value:        BLOOD CULTURE RECEIVED NO GROWTH TO DATE CULTURE WILL BE HELD FOR 5 DAYS BEFORE ISSUING A FINAL NEGATIVE REPORT     [facility]   Report Status PENDING   Incomplete  MRSA PCR SCREENING     Status: None   Collection Time    07/25/13  5:39 AM      Result Value Ref Range Status   MRSA by PCR NEGATIVE  NEGATIVE Final   Comment:            The GeneXpert MRSA Assay (FDA     approved for NASAL specimens     only), is one component of a     comprehensive MRSA colonization     surveillance program. It is not     intended to diagnose MRSA     infection nor to guide or     monitor treatment for     MRSA infections.    Anti-infectives   Start     Dose/Rate Route Frequency Ordered Stop   07/25/13 1800  vancomycin (VANCOCIN) IVPB 1000 mg/200 mL premix     1,000 mg 200 mL/hr over 60 Minutes  Intravenous Every 12 hours 07/25/13 0555     07/25/13 1000  ceFEPIme (MAXIPIME) 1 g in dextrose 5 % 50 mL IVPB     1 g 100 mL/hr over 30 Minutes Intravenous Every 8 hours 07/25/13 0555     07/25/13 0230  ceFEPIme (MAXIPIME) 2 g in dextrose 5 % 50 mL IVPB     2 g 100 mL/hr over 30 Minutes Intravenous  Once 07/25/13 0219 07/25/13 0338   07/25/13 0230  vancomycin (VANCOCIN) 1,500 mg in sodium chloride 0.9 % 500 mL IVPB     1,500 mg 250 mL/hr over 120 Minutes Intravenous  Once 07/25/13 0220 07/25/13 0540      Assessment: 53 yo F presenting w/ seizure from NH. Currently on day  4 of Vanc/Cefepime for possible pneumonia. Recent CXR showing increased opacity at lung bases suspicious for PNA. Patient is afebrile, wbc wnl, and renal function stable.   2/26 Vanc>> 2/26 Cefepime>> 2/25 urine>>negative 2/26 blood> ngtd  Goal of Therapy:  Vancomycin trough level 15-20 mcg/ml  Plan:  Continue Cefepime 1g IV q8h (per MD plan for 5-7days) Continue Vancomycin 1g IV q12h Will check VT prior to eveing dose at 1730 Monitor clinical progression, cultures, and renal function   Forestine Na M 07/28/2013,12:21 PM   PM addendum  VT 13 < goal 15-20 Dose already hung tonight  Will increase Vancomycin 1250mg  q12 starting 3/2 am  Leota Sauers Pharm.D. CPP, BCPS Clinical Pharmacist [phone] 07/28/2013 6:52 PM

## 2013-07-28 NOTE — Progress Notes (Signed)
NEURO HOSPITALIST PROGRESS NOTE   SUBJECTIVE:                                                                                                                        Sleeping comfortably. Nursing staff indicated that the right arm jerking movement are much improved this morning. No other neurological developments.  On keppra 1,500 mg BID, Depakote 750 mg TID, and vimpat 100 BID.  OBJECTIVE:                                                                                                                           Vital signs in last 24 hours: Temp:  [97.9 F (36.6 C)-99.7 F (37.6 C)] 97.9 F (36.6 C) (03/01 0800) Pulse Rate:  [95-122] 120 (03/01 0800) Resp:  [7-19] 15 (03/01 0800) BP: (97-121)/(61-69) 120/65 mmHg (03/01 0800) SpO2:  [100 %] 100 % (03/01 0800)  Intake/Output from previous day: 02/28 0701 - 03/01 0700 In: 2623 [I.V.:3; NG/GT:1840; IV Piggyback:660] Out: 1450 [Urine:1450] Intake/Output this shift: Total I/O In: 750 [NG/GT:500; IV Piggyback:250] Out: -  Nutritional status:    Neurologic Exam:  Patient resting, will defer neuro-exam.  Lab Results: No results found for this basename: cbc, bmp, coags, chol, tri, ldl, hga1c   Lipid Panel No results found for this basename: CHOL, TRIG, HDL, CHOLHDL, VLDL, LDLCALC,  in the last 72 hours  Studies/Results: Dg Chest Port 1 View  07/26/2013   CLINICAL DATA:  History of pneumonia, weakness, lethargy  EXAM: PORTABLE CHEST - 1 VIEW  COMPARISON:  Portable chest x-ray of 07/25/2013  FINDINGS: There is more opacity at both lung bases most consistent with developing pneumonia. Some of the opacity may be due to atelectasis and possible small effusions. Cardiomegaly is stable. There are degenerative changes throughout the thoracic spine.  IMPRESSION: Increase in opacity at the lung bases suspicious for pneumonia. Cannot exclude small effusions.   Electronically Signed   By: Dwyane Dee M.D.    On: 07/26/2013 16:15    MEDICATIONS  I have reviewed the patient's current medications.  ASSESSMENT/PLAN:                                                                                                            Right focal motor SE?[name]. Improving. Continue current AED regimen. Will follow up  [name] Portelasvaldo Camilo, MD [facility] [phone]  07/28/2013, 11:06 AM

## 2013-07-28 NOTE — Progress Notes (Addendum)
[facility] Progress Note [place] TEAM 1 - Stepdown/ICU TEAM   Joan PeelingLora Dorris VHQ:[id number] DOB: 07/19/1960 DOA: 07/24/2013 PCP: Karlene EinsteinASANAYAKA,[name], MD  Brief narrative: Joan [name] is a 53 y.o. female presenting on 07/24/2013 with female history of seizures and who was recently admitted for encephalopathy and was treated for possible autoimmune encephalitis, had complicated course following J-tube placement with bowel obstruction was brought from the nursing home after patient was found to have generalized tonic-clonic seizures which lasted for around 10 minutes. Patient was post ictal after the seizures and was initially found to be lethargic in the ER. Patient also was found to be mildly febrile. Later patient became more alert awake. Chest x-ray shows possible pneumonic process. In the ER patient was given 500 mg of IV Keppra and was started on vancomycin and cefepime for seizures. Patient did have almost a year after the Keppra was started. I have consulted Dr. Thad Rangereynolds from neurology. As per the nursing home staff patient did not have any nausea vomiting abdominal pain chest pain or shortness of breath.   Subjective: Alert, talking but slowly- no complaints.   Assessment/Plan:  Recurrent Seizures - RUE focal seizures -Pt has a PMH of seizures and catatonia.  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 Pt received diatech catheter on 06/20/2013 placement and received and finished plasmapheresis-  Possible HCAP - cont Vanc and Cefepime x 5-7 days - repeat CXR does reveal b/l infiltrates increased from prior xray  Hypokalemia - replacing- check Mg  Thrombocytopenia - slightly low plt today- follow   - J tube/ Gastroparesis  - cont Vital and free water  Hx of Anxiety/depression and delirium   -as outpt,  pt on Remeron and risperdal  -attempt to avoid benzodiazepines and narcotics   HTN  -metoprolol  75 mg twice a day  -HR better controlled   Malnutrition (severe protein calorie malnutrition) with history of Gastric Bypass/partial gastrectomy  -Gen Surg placed J-tube  and underwent revision of j tube with exploratory laparotomy on 2/3 and small bowel resection.  -She was able to take a dysphagia 1 diet by mouth - will resume when alert enough -continue tube feedings   Bacteremia  - Klebsiella pneumoniae bacteremia  -Plan was to continue ciprofloxacin through 07/26/2013 - -repeat blood cx negative  Thrombocytopenia  -improved to normal  Pleural effusions  -noted to have resolved on CXR   Code Status: Full code Family Communication: none Disposition Plan: transfer to neuro floor  Consultants: Neuro  Procedures: None  Antibiotics: Anti-infectives   Start     Dose/Rate Route Frequency Ordered Stop   07/25/13 1800  vancomycin (VANCOCIN) IVPB 1000 mg/200 mL premix     1,000 mg 200 mL/hr over 60 Minutes Intravenous Every 12 hours 07/25/13 0555     07/25/13 1000  ceFEPIme (MAXIPIME) 1 g in dextrose 5 % 50 mL IVPB     1 g 100 mL/hr over 30 Minutes Intravenous Every 8 hours 07/25/13 0555     07/25/13 0230  ceFEPIme (MAXIPIME) 2 g in dextrose 5 % 50 mL IVPB     2 g 100 mL/hr over 30 Minutes Intravenous  Once 07/25/13 0219 07/25/13 0338   07/25/13 0230  vancomycin (VANCOCIN) 1,500 mg in sodium chloride 0.9 % 500 mL IVPB     1,500 mg 250 mL/hr over 120 Minutes Intravenous  Once  07/25/13 0220 07/25/13 0540       DVT prophylaxis: SCDs  Objective: Filed Weights   07/25/13 0315 07/26/13 0500 07/27/13 0102  Weight: 77.4 kg (170 lb 10.2 oz) 83.4 kg (183 lb 13.8 oz) 82.5 kg (181 lb 14.1 oz)   Blood pressure 120/65, pulse 120, temperature 97.9 F (36.6 C), temperature source Oral, resp. rate 15, height 5' 2.99" (1.6 m), weight 82.5 kg (181 lb 14.1 oz), SpO2  100.00%.  Intake/Output Summary (Last 24 hours) at 07/28/13 1122 Last data filed at 07/28/13 1104  Gross per 24 hour  Intake   2768 ml  Output   1450 ml  Net   1318 ml     Exam: General: No acute respiratory distress- alert and oriented x 3Lungs: Clear to auscultation bilaterally without wheezes or crackles Cardiovascular: Regular rate and rhythm without murmur gallop or rub normal S1 and S2 Abdomen: Nontender, nondistended, soft, bowel sounds positive, no rebound, no ascites, no appreciable mass- J tube intact Extremities: No significant cyanosis, clubbing, or edema bilateral lower extremities Neuro:  slight jerking movement of right arm which starts after I start talking to her   Data Reviewed: Basic Metabolic Panel:  Recent Labs Lab 07/23/13 0415 07/24/13 2304 07/25/13 0642 07/26/13 0233 07/27/13 0932 07/28/13 0914  NA 144 144 144 141 142 144  K 3.4* 3.4* 3.4* 3.6* 3.6* 3.3*  CL 105 104 109 106 107 108  CO2 28 29 26 25 27 27   GLUCOSE 92 94 84 88 76 91  BUN 5* 6 6 7 7 7   CREATININE 0.56 0.49* 0.40* 0.42* 0.36* 0.33*  CALCIUM 8.2* 8.3* 7.7* 8.3* 8.2* 8.1*  MG 1.9  --   --   --   --   --    Liver Function Tests:  Recent Labs Lab 07/23/13 1110 07/24/13 2304 07/25/13 0642  AST 15 46* 24  ALT 9 16 11   ALKPHOS 79 82 68  BILITOT 0.3 0.4 0.3  PROT 4.9* 5.5* 4.6*  ALBUMIN 2.5* 2.7* 2.2*    Recent Labs Lab 07/24/13 2304  LIPASE 11    Recent Labs Lab 07/23/13 1110  AMMONIA 23   CBC:  Recent Labs Lab 07/22/13 0500 07/23/13 0825 07/24/13 2304 07/25/13 0642 07/28/13 0914  WBC 6.8 8.0 7.5 11.4* 8.1  NEUTROABS  --   --  5.1 8.8*  --   HGB 9.7* 10.1* 11.3* 10.4* 9.8*  HCT 29.5* 30.4* 34.4* 31.4* 29.6*  MCV 96.1 96.5 97.2 98.1 97.4  PLT 151 167 176 154 143*   Cardiac Enzymes: No results found for this basename: CKTOTAL, CKMB, CKMBINDEX, TROPONINI,  in the last 168 hours BNP (last 3 results) No results found for this basename: PROBNP,  in the last  8760 hours CBG:  Recent Labs Lab 07/27/13 0120 07/27/13 0619 07/27/13 1216 07/28/13 0104 07/28/13 0818  GLUCAP 80 99 86 93 98    Recent Results (from the past 240 hour(s))  URINE CULTURE     Status: None   Collection Time    07/20/13  2:15 AM      Result Value Ref Range Status   Specimen Description URINE, CATHETERIZED   Final   Special Requests Cipro Immunocompromised   Final   Culture  Setup Time     Final   Value: 07/20/2013 12:13     Performed at [facility] Count     Final   Value: NO GROWTH     Performed at [facility]  Culture     Final   Value: NO GROWTH     Performed at [facility]   Report Status 07/21/2013 FINAL   Final  URINE CULTURE     Status: None   Collection Time    07/24/13  1:28 AM      Result Value Ref Range Status   Specimen Description URINE, CATHETERIZED   Final   Special Requests NONE   Final   Culture  Setup Time     Final   Value: 07/24/2013 08:19     Performed at [facility]   Colony Count     Final   Value: NO GROWTH     Performed at [facility]   Culture     Final   Value: NO GROWTH     Performed at [facility]   Report Status 07/25/2013 FINAL   Final  CULTURE, BLOOD (ROUTINE X 2)     Status: None   Collection Time    07/25/13  2:38 AM      Result Value Ref Range Status   Specimen Description BLOOD RIGHT ARM   Final   Special Requests BOTTLES DRAWN AEROBIC ONLY [facility]   Final   Culture  Setup Time     Final   Value: 07/25/2013 09:54     Performed at [facility]   Culture     Final   Value:        BLOOD CULTURE RECEIVED NO GROWTH TO DATE CULTURE WILL BE HELD FOR 5 DAYS BEFORE ISSUING A FINAL NEGATIVE REPORT     Performed at [facility]   Report Status PENDING   Incomplete  CULTURE, BLOOD (ROUTINE X 2)     Status: None   Collection Time    07/25/13  2:45 AM      Result Value Ref Range Status   Specimen Description BLOOD RIGHT HAND   Final   Special  Requests     Final   Value: BOTTLES DRAWN AEROBIC AND ANAEROBIC 6CC AEROBIC 4CC ANAEROBIC   Culture  Setup Time     Final   Value: 07/25/2013 09:54     Performed at [facility]   Culture     Final   Value:        BLOOD CULTURE RECEIVED NO GROWTH TO DATE CULTURE WILL BE HELD FOR 5 DAYS BEFORE ISSUING A FINAL NEGATIVE REPORT     Performed at [facility]   Report Status PENDING   Incomplete  MRSA PCR SCREENING     Status: None   Collection Time    07/25/13  5:39 AM      Result Value Ref Range Status   MRSA by PCR NEGATIVE  NEGATIVE Final   Comment:            The GeneXpert MRSA Assay (FDA     approved for NASAL specimens     only), is one component of a     comprehensive MRSA colonization     surveillance program. It is not     intended to diagnose MRSA     infection nor to guide or     monitor treatment for     MRSA infections.     Studies:  Recent x-ray studies have been reviewed in detail by the Attending Physician  Scheduled Meds:  Scheduled Meds: . ceFEPime (MAXIPIME) IV  1 g Intravenous Q8H  . chlorhexidine  15 mL Mouth/Throat BID  .  ferrous sulfate  300 mg Per Tube BID WC  . free water  200 mL Per Tube Q6H  . lacosamide (VIMPAT) IV  150 mg Intravenous Q12H  . levETIRAcetam  1,500 mg Intravenous Q12H  . metoprolol  75 mg Oral BID  . mirtazapine  15 mg Oral QHS  . pantoprazole sodium  40 mg Per Tube Q1200  . risperiDONE  0.5 mg Oral BID  . sodium chloride  3 mL Intravenous Q12H  . Valproic Acid  750 mg Per Tube 3 times per day  . vancomycin  1,000 mg Intravenous Q12H   Continuous Infusions: . feeding supplement (VITAL AF 1.2 CAL) 1,000 mL (07/27/13 2016)    Time spent on care of this patient: 25 min   Calvert Cantor, MD  [facility] Office  [phone] Pager - Text Page per Loretha Stapler as per below:  On-Call/Text Page:      [email or web address]      password [email or web address]  If 7PM-7AM, please contact night-coverage [email or web address] Password  [email or web address] 07/28/2013, 11:22 AM   LOS: 4 days

## 2013-07-29 LAB — BASIC METABOLIC PANEL
BUN: 8 mg/dL (ref 6–23)
CO2: 28 mEq/L (ref 19–32)
Calcium: 8 mg/dL — ABNORMAL LOW (ref 8.4–10.5)
Chloride: 109 mEq/L (ref 96–112)
Creatinine, Ser: 0.34 mg/dL — ABNORMAL LOW (ref 0.50–1.10)
GFR calc Af Amer: >90 mL/min (ref >90)
GFR calc non Af Amer: >90 mL/min (ref >90)
GLUCOSE: 103 mg/dL — AB (ref 70–99)
POTASSIUM: 3.3 meq/L — AB (ref 3.7–5.3)
Sodium: 146 mEq/L (ref 137–147)

## 2013-07-29 LAB — GLUCOSE, CAPILLARY
Glucose-Capillary: 93 mg/dL (ref 70–99)
Glucose-Capillary: 91 mg/dL (ref 70–99)
Glucose-Capillary: 79 mg/dL (ref 70–99)

## 2013-07-29 MED ORDER — POTASSIUM CHLORIDE 20 MEQ/15ML (10%) PO LIQD
40.0000 meq | Freq: Two times a day (BID) | ORAL | Status: DC
  Administered 2013-07-29 – 2013-07-30 (×2): 40 meq
  Filled 2013-07-29 (×2): qty 30

## 2013-07-29 MED ORDER — ACETAMINOPHEN 650 MG RE SUPP: 650.0000 mg | Freq: Four times a day (QID) | RECTAL | Status: DC | PRN

## 2013-07-29 MED ORDER — ONDANSETRON HCL 4 MG PO TABS: 4.0000 mg | ORAL_TABLET | Freq: Four times a day (QID) | ORAL | Status: DC | PRN

## 2013-07-29 MED ORDER — ONDANSETRON HCL 4 MG/2ML IJ SOLN: 4.0000 mg | Freq: Four times a day (QID) | INTRAMUSCULAR | Status: DC | PRN

## 2013-07-29 MED ORDER — METOPROLOL TARTRATE 50 MG PO TABS
75.0000 mg | ORAL_TABLET | Freq: Two times a day (BID) | ORAL | Status: DC
  Administered 2013-07-29 – 2013-07-30 (×2): 75 mg via JEJUNOSTOMY
  Filled 2013-07-29 (×3): qty 1

## 2013-07-29 MED ORDER — ACETAMINOPHEN 325 MG PO TABS: 650.0000 mg | ORAL_TABLET | Freq: Four times a day (QID) | ORAL | Status: DC | PRN

## 2013-07-29 MED ORDER — MIRTAZAPINE 15 MG PO TBDP
15.0000 mg | ORAL_TABLET | Freq: Every day | ORAL | Status: DC
  Administered 2013-07-29: 15 mg via JEJUNOSTOMY
  Filled 2013-07-29 (×2): qty 1

## 2013-07-29 NOTE — Progress Notes (Signed)
Clinical Social Work Department BRIEF PSYCHOSOCIAL ASSESSMENT 07/29/2013  Patient:  Joan [name],Joan [name]     Account Number:  [id number]     Admit date:  07/24/2013  Clinical Social Worker:  Varney BilesANDERSON,Maddie Brazier, LCSWA  Date/Time:  07/29/2013 11:18 AM  Referred by:  Physician  Date Referred:  07/29/2013 Referred for  SNF Placement   Other Referral:   Interview type:  Patient Other interview type:    PSYCHOSOCIAL DATA Living Status:  FACILITY [facility] Level of care:  Skilled Nursing Facility Primary support name:  Doylene BodeMarcus [name] ([phone]) Primary support relationship to patient:  CHILD, ADULT Degree of support available:   Good--pt receives support from children and was resident at [facility] prior to hospitalization.    CURRENT CONCERNS Current Concerns  Post-Acute Placement   Other Concerns:    SOCIAL WORK ASSESSMENT / PLAN CSW called pt's son [name], as pt is disoriented x3 per the chart. [name] explained [facility] but that the family was unhappy with the care she was receiving so [facility], per [name], before coming back in to the hospital. [name] explained he feels like he is playing catch-up, trying to figure out where his mom is because she is so frequently in and out of the hospital and the switch in facilities did not make it any easier to keep track of her whereabouts. CSW provided support and explained that CSW will keep [name] updated, even if she transfers to a different unit in the hospital. [name] states he appreciates this, and CSW confirmed that the plan is to [facility] [place] [facility] when ready for discharge. [name] confirmed and asked if there are already plans for this, and CSW assured him that CSW has not been informed of impending discharge yet but CSW will update [name] if anything changes. [name] thanked CSW again.    Assessment/plan status:  Psychosocial Support/Ongoing Assessment of Needs Other assessment/ plan:   Information/referral to community resources:   SNF ([facility]).    PATIENT'S/FAMILY'S RESPONSE TO PLAN OF CARE: Good--pt's son [name] explained he has been stressed because his mother recently switched facilities and she was only there for a few hours before being rushed back to the hospital. [name] states he only was informed of her hospitalization a few days ago, and he feels that he has been in the dark concerning her whereabouts because she is frequently transported from one level of care facility to another. CSW provided support and assured him that CSW will update Joan discharge or if pt is transferred while in the hospital. [name] thanked CSW and states he appreciates this.       Maryclare LabradorJulie Ellyce Lafevers, MSW, [facility]WA Clinical Social Worker [phone]

## 2013-07-29 NOTE — Progress Notes (Signed)
[facility]  Joan [name] ZOX:[id number] DOB: 26-Dec-1960 DOA: 07/24/2013 PCP: [name], MD  Brief narrative: 53 year old female patient with complicated past medical history. Initially was admitted December 2014 with toxic metabolic encephalopathy. CSF at that time suggestive of meningeal encephalitis but bacterial viral CSF studies were negative. Patient's mentation remained altered but alert at time of discharge from that hospitalization. She returned to the hospital on 06/19/2013 because of hypertensive urgency. She apparently had been scheduled for a percutaneous G-tube placement on 06/18/2013 but was too confused and agitated to undergo this procedure. Apparently has a past surgical history of gastric bypass.  She had a protracted hospitalization from 06/19/2013 to 07/16/2013. Discharge diagnoses included persistent encephalitis of unclear etiology, J-tube malfunction/SBO requiring revision surgery and Klebsiella UTI with bacteremia.  She was readmitted to the hospital on 07/17/2013 with seizures and malfunctioning of the jejunostomy tube. MRI during that admission showed resolution of a prior thalamic signal abnormality but apparently altered mentation and catatonia had persisted. It was noted that the previous jejunostomy tube had been placed on 07/02/2013 (after exploratory laparotomy with small bowel resection) had malfunctioned and was replaced by surgery during that admission and at time of discharge she was tolerating tube feeding of vital AF at 60 cc per hour. Since she had then been readmitted so close to her previous discharge the discharge medication of Cipro for the previously diagnosed Klebsiella bacteremia had been continued. She again was discharged 07/19/2013.  She once again returned to the hospital on 07/25/2013 with recurrent seizures after witnessed generalized tonic clonic seizure duration for 10 minutes. Upon her presentation to the  ER she was postictal and febrile. She was given Keppra in the ER and initial chest x-ray was suggestive of possible pneumonia process. Dr. Thad Ranger with neurology was consulted. According to the nursing home record patient had not been experiencing any nausea, vomiting, abdominal pain, chest pain or shortness of breath prior to admission.   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  -diatech catheter placed on 06/20/2013 with subsequent plasmapheresis -now completed and no indications at this time to repeat this tx   Acute hypoxic respiratory failure / Possible HCAP  - cont Vanc and Cefepime for now  - CXR 2/27 questioned b/l infiltrates, but are not convincing - repeat CXR in AM and consider stopping abx if no clear infiltrate noted   J tube for chronic Gastroparesis  - cont Vital and free water   Hx of Anxiety/depression and delirium  -as outpt, pt on Remeron and risperdal  -Recommend avoidance of benzodiazepines and narcotics   HTN  -metoprolol 75 mg twice a day  -HR better controlled   Severe protein calorie malnutrition with history of Gastric Bypass / partial gastrectomy / dysphagia  -Gen Surg placed J-tube and underwent revision of j tube with exploratory laparotomy on 2/3 and small bowel resection.  -Since apparently has severe gastroparesis not sure efficacy of resuming oral diet if becomes alert unless for comfort only -continue tube feedings   Recent Klebsiella Bacteremia  -Plan was to continue ciprofloxacin  through 07/26/2013    -Repeat blood cultures 2/26 no growth to date  Thrombocytopenia  -Resolved  Pleural effusions  -noted to have resolved on CXR performed this admission  DVT prophylaxis: SCDs Code Status: Full Family Communication: No family at bedside Disposition Plan/Expected LOS: Stepdown - evaluate for LTAC  Consultants: Neurology  Procedures: None  Antibiotics: Cefepime 2/25 >>> Vancomycin 2/25 >>> Ciprofloxacin started last admission >>> 2/27  HPI/Subjective: Patient lethargic and does not open eyes - no spontaneous movement noted.  Objective: Blood pressure 109/56, pulse 116, temperature 99.2 F (37.3 C), temperature source Axillary, resp. rate 14, height 5' 2.99" (1.6 m), weight 181 lb 14.1 oz (82.5 kg), SpO2 100.00%.  Intake/Output Summary (Last 24 hours) at 07/29/13 1204 Last data filed at 07/29/13 1042  Gross per 24 hour  Intake   2439 ml  Output   1675 ml  Net    764 ml   Exam: General: No acute respiratory distress evident  Lungs: course to auscultation bilaterally without wheezes or crackles,  2L Cardiovascular: Regular rate and rhythm without murmur gallop or rub normal S1 and S2, no peripheral edema or JVD Abdomen: Nontender, nondistended, soft, bowel sounds positive, no rebound, no ascites, no appreciable mass-J-tube with tube feeding Musculoskeletal: No significant cyanosis, clubbing of bilateral lower extremities Neurological: obtunded  Scheduled Meds:  Scheduled Meds: . ceFEPime (MAXIPIME) IV  1 g Intravenous Q8H  . chlorhexidine  15 mL Mouth/Throat BID  . ferrous sulfate  300 mg Per Tube BID WC  . free water  200 mL Per Tube Q6H  . lacosamide (VIMPAT) IV  150 mg Intravenous Q12H  . levETIRAcetam  1,500 mg Intravenous Q12H  . metoprolol  75 mg Oral BID  . mirtazapine  15 mg Oral QHS  . pantoprazole sodium  40 mg Per Tube Q1200  . potassium chloride  40 mEq Oral BID  . risperiDONE  0.5 mg Oral BID  . sodium chloride  3 mL Intravenous Q12H  . Valproic  Acid  750 mg Per Tube 3 times per day  . vancomycin  1,250 mg Intravenous Q12H   Continuous Infusions: . feeding supplement (VITAL AF 1.2 CAL) 1,000 mL (07/28/13 1715)    Data Reviewed: Basic Metabolic Panel:  Recent Labs Lab 07/23/13 0415  07/25/13 1610 07/26/13 0233 07/27/13 0932 07/28/13 0914 07/29/13 0657  NA 144  < > 144 141 142 144 146  K 3.4*  < > 3.4* 3.6* 3.6* 3.3* 3.3*  CL 105  < > 109 106 107 108 109  CO2 28  < > 26 25 27 27 28   GLUCOSE 92  < > 84 88 76 91 103*  BUN 5*  < > 6 7 7 7 8   CREATININE 0.56  < > 0.40* 0.42* 0.36* 0.33* 0.34*  CALCIUM 8.2*  < > 7.7* 8.3* 8.2* 8.1* 8.0*  MG 1.9  --   --   --   --  1.9  --   < > = values in this interval not displayed.  Liver Function Tests:  Recent Labs Lab 07/23/13 1110 07/24/13 2304 07/25/13 0642  AST 15 46* 24  ALT 9 16 11   ALKPHOS 79 82 68  BILITOT 0.3 0.4 0.3  PROT 4.9* 5.5* 4.6*  ALBUMIN 2.5* 2.7* 2.2*    Recent Labs Lab 07/24/13 2304  LIPASE 11    Recent Labs Lab 07/23/13 1110  AMMONIA 23   CBC:  Recent Labs Lab 07/23/13 0825 07/24/13 2304 07/25/13 0642 07/28/13 0914  WBC  8.0 7.5 11.4* 8.1  NEUTROABS  --  5.1 8.8*  --   HGB 10.1* 11.3* 10.4* 9.8*  HCT 30.4* 34.4* 31.4* 29.6*  MCV 96.5 97.2 98.1 97.4  PLT 167 176 154 143*   CBG:  Recent Labs Lab 07/28/13 0818 07/28/13 1200 07/28/13 1751 07/28/13 2347 07/29/13 0605  GLUCAP 98 85 83 100* 93    Recent Results (from the past 240 hour(s))  URINE CULTURE     Status: None   Collection Time    07/20/13  2:15 AM      Result Value Ref Range Status   Specimen Description URINE, CATHETERIZED   Final   Special Requests Cipro Immunocompromised   Final   Culture  Setup Time     Final   Value: 07/20/2013 12:13     Performed at [facility]   [place] Count     Final   Value: NO GROWTH     [facility]formed at [facility]   Culture     Final   Value: NO GROWTH     Performed at [facility]   Report Status  07/21/2013 FINAL   Final  URINE CULTURE     Status: None   Collection Time    07/24/13  1:28 AM      Result Value Ref Range Status   Specimen Description URINE, CATHETERIZED   Final   Special Requests NONE   Final   Culture  Setup Time     Final   Value: 07/24/2013 08:19     Performed at [facility]   Colony Count     Final   Value: NO GROWTH     Performed at [facility]   Culture     Final   Value: NO GROWTH     Performed at [facility]   Report Status 07/25/2013 FINAL   Final  CULTURE, BLOOD (ROUTINE X 2)     Status: None   Collection Time    07/25/13  2:38 AM      Result Value Ref Range Status   Specimen Description BLOOD RIGHT ARM   Final   Special Requests BOTTLES DRAWN AEROBIC ONLY [facility]   Final   Culture  Setup Time     Final   Value: 07/25/2013 09:54     Performed at [facility]   Culture     Final   Value:        BLOOD CULTURE RECEIVED NO GROWTH TO DATE CULTURE WILL BE HELD FOR 5 DAYS BEFORE ISSUING A FINAL NEGATIVE REPORT     Performed at [facility]   Report Status PENDING   Incomplete  CULTURE, BLOOD (ROUTINE X 2)     Status: None   Collection Time    07/25/13  2:45 AM      Result Value Ref Range Status   Specimen Description BLOOD RIGHT HAND   Final   Special Requests     Final   Value: BOTTLES DRAWN AEROBIC AND ANAEROBIC 6CC AEROBIC 4CC ANAEROBIC   Culture  Setup Time     Final   Value: 07/25/2013 09:54     Performed at [facility]   Culture     Final   Value:        BLOOD CULTURE RECEIVED NO GROWTH TO DATE CULTURE WILL BE HELD FOR 5 DAYS BEFORE ISSUING A FINAL NEGATIVE REPORT     Performed at [facility]   Report  Status PENDING   Incomplete  MRSA PCR SCREENING     Status: None   Collection Time    07/25/13  5:39 AM      Result Value Ref Range Status   MRSA by PCR NEGATIVE  NEGATIVE Final   Comment:            The GeneXpert MRSA Assay (FDA     approved for NASAL specimens     only), is one  component of a     comprehensive MRSA colonization     surveillance program. It is not     intended to diagnose MRSA     infection nor to guide or     monitor treatment for     MRSA infections.     Studies:  Recent x-ray studies have been reviewed in detail by the Attending Physician  Time spent : 35 mins     Junious Silk, ANP [facility] Office  [phone] Pager [phone]  **If unable to reach the above provider after paging please contact the Flow Manager @ [phone]  On-Call/Text Page:      [email or web address]      password [email or web address]  If 7PM-7AM, please contact night-coverage [email or web address] Password [email or web address] 07/29/2013, 12:04 PM   LOS: 5 days   I have personally examined this patient and reviewed the entire database. I have reviewed the above note, made any necessary editorial changes, and agree with its content.  Lonia Blood, MD [facility]

## 2013-07-30 ENCOUNTER — Inpatient Hospital Stay (HOSPITAL_COMMUNITY): Payer: Medicare Other

## 2013-07-30 DIAGNOSIS — J189 Pneumonia, unspecified organism: Secondary | ICD-10-CM

## 2013-07-30 DIAGNOSIS — R569 Unspecified convulsions: Secondary | ICD-10-CM

## 2013-07-30 DIAGNOSIS — D649 Anemia, unspecified: Secondary | ICD-10-CM

## 2013-07-30 LAB — GLUCOSE, CAPILLARY
GLUCOSE-CAPILLARY: 100 mg/dL — AB (ref 70–99)
GLUCOSE-CAPILLARY: 99 mg/dL (ref 70–99)
Glucose-Capillary: 99 mg/dL (ref 70–99)

## 2013-07-30 LAB — BASIC METABOLIC PANEL
BUN: 6 mg/dL (ref 6–23)
CO2: 29 meq/L (ref 19–32)
Calcium: 8.3 mg/dL — ABNORMAL LOW (ref 8.4–10.5)
Chloride: 109 mEq/L (ref 96–112)
Creatinine, Ser: 0.4 mg/dL — ABNORMAL LOW (ref 0.50–1.10)
GFR calc Af Amer: >90 mL/min (ref >90)
GFR calc non Af Amer: >90 mL/min (ref >90)
Glucose, Bld: 98 mg/dL (ref 70–99)
POTASSIUM: 5.1 meq/L (ref 3.7–5.3)
SODIUM: 145 meq/L (ref 137–147)

## 2013-07-30 MED ORDER — VALPROIC ACID 250 MG/5ML PO SYRP: 750.0000 mg | ORAL_SOLUTION | Freq: Three times a day (TID) | ORAL | Status: DC

## 2013-07-30 MED ORDER — RISPERIDONE 0.5 MG PO TBDP: 0.5000 mg | ORAL_TABLET | Freq: Two times a day (BID) | ORAL | Status: DC

## 2013-07-30 MED ORDER — ACETAMINOPHEN 325 MG PO TABS: 650.0000 mg | ORAL_TABLET | Freq: Four times a day (QID) | ORAL | Status: DC | PRN

## 2013-07-30 MED ORDER — FREE WATER: 200.0000 mL | Freq: Four times a day (QID) | Status: DC

## 2013-07-30 MED ORDER — METOPROLOL TARTRATE 25 MG PO TABS: 75.0000 mg | ORAL_TABLET | Freq: Two times a day (BID) | ORAL | Status: DC

## 2013-07-30 MED ORDER — SODIUM CHLORIDE 0.9 % IV SOLN: 150.0000 mg | Freq: Two times a day (BID) | INTRAVENOUS | Status: DC

## 2013-07-30 MED ORDER — DEXTROSE 5 % IV SOLN: 1.0000 g | Freq: Three times a day (TID) | INTRAVENOUS | Status: DC

## 2013-07-30 MED ORDER — MIRTAZAPINE 15 MG PO TBDP: 15.0000 mg | ORAL_TABLET | Freq: Every day | ORAL | Status: DC

## 2013-07-30 MED ORDER — ACETAMINOPHEN 650 MG RE SUPP: 650.0000 mg | Freq: Four times a day (QID) | RECTAL | Status: DC | PRN

## 2013-07-30 MED ORDER — FERROUS SULFATE 300 (60 FE) MG/5ML PO SYRP: 300.0000 mg | ORAL_SOLUTION | Freq: Two times a day (BID) | ORAL | Status: DC

## 2013-07-30 MED ORDER — VITAL AF 1.2 CAL PO LIQD: 1000.0000 mL | ORAL | Status: DC

## 2013-07-30 MED ORDER — ONDANSETRON HCL 4 MG PO TABS: 4.0000 mg | ORAL_TABLET | Freq: Four times a day (QID) | ORAL | Status: DC | PRN

## 2013-07-30 MED ORDER — LEVETIRACETAM 500 MG/5ML IV SOLN: 1500.0000 mg | Freq: Two times a day (BID) | INTRAVENOUS | Status: DC

## 2013-07-30 MED ORDER — PANTOPRAZOLE SODIUM 40 MG PO PACK: 40.0000 mg | PACK | Freq: Every day | ORAL | Status: DC

## 2013-07-30 MED ORDER — VANCOMYCIN HCL 10 G IV SOLR: 1250.0000 mg | Freq: Two times a day (BID) | INTRAVENOUS | Status: DC

## 2013-07-30 MED ORDER — ONDANSETRON HCL 4 MG/2ML IJ SOLN: 4.0000 mg | Freq: Four times a day (QID) | INTRAMUSCULAR | Status: DC | PRN

## 2013-07-30 NOTE — Progress Notes (Signed)
Report called to [facility] Michelle Javier RN. PTAR to be notified for transport 

## 2013-07-30 NOTE — Progress Notes (Signed)
[facility]  Joan [name] ZOX:[id number] DOB: 1960/06/27 DOA: 07/24/2013 PCP: [name], MD  Brief narrative: 53 year old female patient with complicated past medical history. Initially was admitted December 2014 with toxic metabolic encephalopathy. CSF at that time suggestive of meningeal encephalitis but bacterial viral CSF studies were negative. Patient's mentation remained altered but alert at time of discharge from that hospitalization. She returned to the hospital on 06/19/2013 because of hypertensive urgency. She apparently had been scheduled for a percutaneous G-tube placement on 06/18/2013 but was too confused and agitated to undergo this procedure. Apparently has a past surgical history of gastric bypass.  She had a protracted hospitalization from 06/19/2013 to 07/16/2013. Discharge diagnoses included persistent encephalitis of unclear etiology, J-tube malfunction/SBO requiring revision surgery and Klebsiella UTI with bacteremia.  She was readmitted to the hospital on 07/17/2013 with seizures and malfunctioning of the jejunostomy tube. MRI during that admission showed resolution of a prior thalamic signal abnormality but apparently altered mentation and catatonia had persisted. It was noted that the previous jejunostomy tube had been placed on 07/02/2013 (after exploratory laparotomy with small bowel resection) had malfunctioned and was replaced by surgery during that admission and at time of discharge she was tolerating tube feeding of vital AF at 60 cc per hour. Since she had then been readmitted so close to her previous discharge the discharge medication of Cipro for the previously diagnosed Klebsiella bacteremia had been continued. She again was discharged 07/19/2013.  She once again returned to the hospital on 07/25/2013 with recurrent seizures after witnessed generalized tonic clonic seizure duration for 10 minutes. Upon her presentation to the  ER she was postictal and febrile. She was given Keppra in the ER and initial chest x-ray was suggestive of possible pneumonia process. Dr. Thad Ranger with neurology was consulted. According to the nursing home record patient had not been experiencing any nausea, vomiting, abdominal pain, chest pain or shortness of breath prior to admission.   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  -Since apparently has  severe  gastroparesis not sure efficacy of resuming oral diet if becomes alert unless for comfort only -continue tube feedings   Recent Klebsiella Bacteremia  -Was on ciprofloxacin through 07/26/2013  -Repeat blood cultures 2/26 no growth to date  Thrombocytopenia  -Resolved  Pleural effusions  -noted to have resolved on CXR performed this admission  DVT prophylaxis: SCDs Code Status: Full Family Communication: No family at bedside Disposition Plan/Expected LOS: Stepdown - LTAC when appropriate  Consultants: Neurology  Procedures: None  Antibiotics: Cefepime 2/25 >>> Vancomycin 2/25 >>> Ciprofloxacin started last admission >>> 2/27  HPI/Subjective: Patient awake and able to tell us she is in the hospital. No complaints verbalized  Objective: Blood pressure 109/71, pulse 105, temperature 98.1 F (36.7 C), temperature source Oral, resp. rate 16, height 5' 2.99" (1.6 m), weight 181 lb 14.1 oz (82.5 kg), SpO2 100.00%.  Intake/Output Summary (Last 24 hours) at 07/30/13 1235 Last data filed at 07/30/13 1151  Gross per 24 hour  Intake    720 ml  Output   2525 ml  Net  -1805 ml   Exam: General: No acute respiratory distress evident  Lungs: course to auscultation bilaterally without wheezes or crackles,  3 L Cardiovascular: Regular rate and rhythm without murmur gallop or rub normal S1 and S2, no peripheral edema or JVD Abdomen: Nontender, nondistended, soft, bowel sounds positive, no rebound, no ascites, no appreciable mass-J-tube with tube feeding Musculoskeletal: No significant cyanosis, clubbing of bilateral lower extremities Neurological:   Scheduled Meds:  Scheduled Meds: . ceFEPime (MAXIPIME) IV  1 g Intravenous Q8H  . chlorhexidine  15 mL Mouth/Throat BID  . ferrous sulfate  300 mg Per Tube BID WC  . free water  200 mL Per Tube Q6H  . lacosamide (VIMPAT) IV  150 mg Intravenous Q12H  . levETIRAcetam  1,500 mg Intravenous Q12H  . metoprolol  75 mg Per J Tube BID  .  mirtazapine  15 mg Per J Tube QHS  . pantoprazole sodium  40 mg Per Tube Q1200  . potassium chloride  40 mEq Per Tube BID  . risperiDONE  0.5 mg Oral BID  . sodium chloride  3 mL Intravenous Q12H  . Valproic Acid  750 mg Per Tube 3 times per day  . vancomycin  1,250 mg Intravenous Q12H   Continuous Infusions: . feeding supplement (VITAL AF 1.2 CAL) 1,000 mL (07/29/13 1711)    Data Reviewed: Basic Metabolic Panel:  Recent Labs Lab 07/26/13 0233 07/27/13 0932 07/28/13 0914 07/29/13 0657 07/30/13 0244  NA 141 142 144 146 145  K 3.6* 3.6* 3.3* 3.3* 5.1  CL 106 107 108 109 109  CO2 25 27 27 28 29   GLUCOSE 88 76 91 103* 98  BUN 7 7 7 8 6   CREATININE 0.42* 0.36* 0.33* 0.34* 0.40*  CALCIUM 8.3* 8.2* 8.1* 8.0* 8.3*  MG  --   --  1.9  --   --     Liver Function Tests:  Recent Labs Lab 07/24/13 2304 07/25/13 0642  AST 46* 24  ALT 16 11  ALKPHOS 82 68  BILITOT 0.4 0.3  PROT 5.5* 4.6*  ALBUMIN 2.7* 2.2*    Recent Labs Lab 07/24/13 2304  LIPASE 11   No results found for this basename: AMMONIA,  in the last 168 hours CBC:  Recent Labs Lab 07/24/13 2304 07/25/13 0642 07/28/13 0914  WBC 7.5 11.4* 8.1  NEUTROABS 5.1 8.8*  --   HGB 11.3* 10.4* 9.8*  HCT 34.4* 31.4* 29.6*  MCV 97.2 98.1 97.4  PLT 176 154 143*   CBG:  Recent Labs Lab 07/29/13 1234 07/29/13 1850 07/30/13 0013 07/30/13 0800 07/30/13 1157  GLUCAP 91 79 99 100* 99    Recent Results (from the past 240 hour(s))  URINE CULTURE     Status: None   Collection Time    07/24/13  1:28 AM      Result Value Ref Range Status   Specimen Description URINE, CATHETERIZED   Final   Special Requests NONE   Final   Culture  Setup Time     Final   Value: 07/24/2013 08:19     Performed at [facility] Count     Final   Value: NO GROWTH     Performed at [facility]   Culture     Final   Value: NO GROWTH     Performed at [facility]   Report Status 07/25/2013 FINAL    Final  CULTURE, BLOOD (ROUTINE X 2)     Status: None   Collection Time    07/25/13  2:38 AM      Result Value Ref Range Status   Specimen Description BLOOD RIGHT ARM   Final   Special Requests BOTTLES DRAWN AEROBIC ONLY [facility]   Final   Culture  Setup Time     Final   Value: 07/25/2013 09:54     Performed at [facility]   Culture     Final   Value:        BLOOD CULTURE RECEIVED NO GROWTH TO DATE CULTURE WILL BE HELD FOR 5 DAYS BEFORE ISSUING A FINAL NEGATIVE REPORT     Performed at [facility]   Report Status PENDING   Incomplete  CULTURE, BLOOD (ROUTINE X 2)     Status: None   Collection Time    07/25/13  2:45 AM      Result Value Ref Range Status   Specimen Description BLOOD RIGHT HAND   Final   Special Requests     Final   Value: BOTTLES DRAWN AEROBIC AND ANAEROBIC 6CC AEROBIC 4CC ANAEROBIC   Culture  Setup Time     Final   Value: 07/25/2013 09:54     Performed at [facility]   Culture     Final   Value:        BLOOD CULTURE RECEIVED NO GROWTH TO DATE CULTURE WILL BE HELD FOR 5 DAYS BEFORE ISSUING A FINAL NEGATIVE REPORT     Performed at [facility]   Report Status PENDING   Incomplete  MRSA PCR SCREENING     Status: None   Collection Time    07/25/13  5:39 AM      Result Value Ref Range Status   MRSA by PCR NEGATIVE  NEGATIVE Final   Comment:            The GeneXpert MRSA Assay (FDA     approved for NASAL specimens     only), is one component of a     comprehensive MRSA colonization     surveillance program. It is not     intended to diagnose MRSA     infection nor to guide or     monitor treatment for     MRSA infections.     Studies:  Recent x-ray studies have been reviewed in detail by the Attending Physician  Time spent : 35 mins  Junious SilkAllison Ellis, ANP [facility] Office  [phone] Pager [phone]  **If unable to reach the above provider after paging please contact the Flow Manager @  [phone]  On-Call/Text Page:      [email or web address] [email or web address]      password [email or web address]  If 7PM-7AM, please contact night-coverage [email or web address] Password [email or web address] 07/30/2013, 12:35 PM   LOS: 6 days    I have examined the patient, reviewed the chart and modified the above note which I agree with.   Prestyn Stanco,MD [phone] 07/30/2013, 1:52 PM

## 2013-07-30 NOTE — Progress Notes (Signed)
Report called [facility]. PTAR to be notified for transport

## 2013-07-30 NOTE — Discharge Summary (Signed)
Physician Discharge Summary  Joan [name] NFA:[id number] DOB: January 18, 1961 DOA: 07/24/2013  PCP: [name], MD  Admit date: 07/24/2013 Discharge date: 07/30/2013 to LTAC  Time spent: 30 minutes  Recommendations for Outpatient Follow-up:  1. With PCP 1-2 weeks after discharge or follow up at discretion of SNF physician if returns to SNF once discharged from [facility] facility 2. Continue Foley catheter and Flexi-Seal-removable at discretion of accepting facility MD as patient progresses 3. Pharmacy recommends vancomycin trough dosing on 07/31/2013  Discharge Diagnoses:  Principal Problem:   Seizures- recurrent and possibly exacerbated by Cipro Active Problems:   Acute hypoxic respiratory failure HCAP (healthcare-associated pneumonia)- resolving   Tachycardia- persistent on beta blocker   Anemia   Persistent encephalopathy   Chronic gastroparesis requiring J tube   HTN   Severe protein calorie malnutrition   Recent Klebsiella bacteremia- repeat cultures this admit negative  Discharge Condition: stable  Diet recommendation: Tube feeding per J tube- see medications list  Filed Weights   07/27/13 0102 07/29/13 0401 07/30/13 0418  Weight: 181 lb 14.1 oz (82.5 kg) 181 lb 14.1 oz (82.5 kg) 181 lb 14.1 oz (82.5 kg)    History of present illness:  53 year old female patient with complicated past medical history. Initially was admitted December 2014 with toxic metabolic encephalopathy. CSF at that time suggestive of meningeal encephalitis but bacterial viral CSF studies were negative. Patient's mentation remained altered but alert at time of discharge from that hospitalization. She returned to the hospital on 06/19/2013 because of hypertensive urgency. She apparently had been scheduled for a percutaneous G-tube placement on 06/18/2013 but was too confused and agitated to undergo this procedure. Apparently has a past surgical history of gastric bypass.   She had a protracted hospitalization  from 06/19/2013 to 07/16/2013. Discharge diagnoses included persistent encephalitis of unclear etiology, J-tube malfunction/SBO requiring revision surgery and Klebsiella UTI with bacteremia.   She was readmitted to the hospital on 07/17/2013 with seizures and malfunctioning of the jejunostomy tube. MRI during that admission showed resolution of a prior thalamic signal abnormality but apparently altered mentation and catatonia had persisted. It was noted that the previous jejunostomy tube had been placed on 07/02/2013 (after exploratory laparotomy with small bowel resection) had malfunctioned and was replaced by surgery during that admission and at time of discharge she was tolerating tube feeding of vital AF at 60 cc per hour. Since she had then been readmitted so close to her previous discharge the discharge medication of Cipro for the previously diagnosed Klebsiella bacteremia had been continued. She again was discharged 07/19/2013.   She once again returned to the hospital on 07/25/2013 with recurrent seizures after witnessed generalized tonic clonic seizure duration for 10 minutes. Upon her presentation to the ER she was postictal and febrile. She was given Keppra in the ER and initial chest x-ray was suggestive of possible pneumonia process. Dr. Thad Ranger with neurology was consulted. According to the nursing home record patient had not been experiencing any nausea, vomiting, abdominal pain, chest pain or shortness of breath prior to admission.   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  -diatech catheter placed on 06/20/2013 during previous admission with subsequent plasmapheresis -now completed and no indications at this time to repeat this tx  -today has once again awakened and verbal with very flat affect   Acute hypoxic respiratory failure / Likely HCAP  - cont Vancomycin and Cefepime  -the pharmacist states that patient will need a vancomycin trough on 07/31/2013 -still requiring O2  - CXR 2/27 questioned b/l infiltrates, but are not convincing - repeat CXR 3/3 with progression in bilateral ASD so continue anbx's for now   Persistent tachycardia  -concern over inducing tachycardia related CM  -continue current beta blocker -may need to check urine and serum osmo and urine Na to see if needs additional volume or free water per tube  J tube for chronic Gastroparesis  - cont Vital AF and free water   Hx of Anxiety/depression and delirium  -as outpt, pt on Remeron and risperdal  -Recommend avoidance of benzodiazepines and narcotics   HTN  -metoprolol 75 mg twice a day  -HR better controlled but still in low 100s  Severe protein calorie malnutrition with history of Gastric Bypass / partial gastrectomy / dysphagia  -Gen Surg placed J-tube and underwent revision of j tube with exploratory laparotomy on 2/3 and small bowel resection.  -Since apparently has severe gastroparesis not sure efficacy of resuming oral diet if becomes alert unless for comfort only  -continue tube feedings   Recent Klebsiella Bacteremia  -Was on ciprofloxacin through 07/26/2013  -Repeat blood cultures 2/26 no growth to date   Thrombocytopenia  -Resolved   Pleural effusions  -noted to have resolved on CXR performed this admission   Procedures:  None  Consultations:  Neurology  Discharge Exam: Filed Vitals:   07/30/13 1150  BP: 109/71  Pulse: 105  Temp: 98.1 F (36.7 C)  Resp: 16   General: No acute respiratory distress evident  Lungs:  course to auscultation bilaterally without wheezes or crackles, 3 L  Cardiovascular: Regular rate and rhythm without murmur gallop or rub normal S1 and S2, no peripheral edema or JVD  Abdomen: Nontender, nondistended, soft, bowel sounds positive, no rebound, no ascites, no appreciable mass-J-tube with tube feeding  Musculoskeletal: No significant cyanosis, clubbing of bilateral lower extremities  Neurological: Awake today with very flat affect and limited spontaneous movement   Discharge Instructions      Discharge Orders   Future Appointments Provider Department Dept Phone   08/23/2013 4:00 PM Ronal Fear, NP [facility] [phone]   Future Orders Complete By Expires   Diet general  As directed    Scheduling Instructions:     TUBE FEEDING PER J TUBE   Increase activity slowly  As directed    Scheduling Instructions:     Has not been ambulatory for several months but recommend attempt PT/OT at [facility]       Medication List    STOP taking these medications       ciprofloxacin 400 MG/200ML Soln  Commonly known as:  CIPRO     ferrous sulfate 325 (65 FE) MG tablet  Replaced by:  ferrous sulfate 300 (60 FE) MG/5ML syrup     levETIRAcetam 1000 MG tablet  Commonly known as:  KEPPRA     loperamide 2 MG capsule  Commonly known as:  IMODIUM     valproic acid 250 MG capsule  Commonly known as:  DEPAKENE      TAKE these medications  acetaminophen 325 MG tablet  Commonly known as:  TYLENOL  2 tablets (650 mg total) by Per J Tube route every 6 (six) hours as needed for mild pain (or Fever >/= 101).     acetaminophen 650 MG suppository  Commonly known as:  TYLENOL  Place 1 suppository (650 mg total) rectally every 6 (six) hours as needed for mild pain (or Fever >/= 101).     chlorhexidine 0.12 % solution  Commonly known as:  PERIDEX  Use as directed 15 mLs in the mouth or throat 2 (two) times daily.     dextrose 5 % SOLN 50 mL with ceFEPIme 1 G SOLR 1 g   Inject 1 g into the vein every 8 (eight) hours.     feeding supplement (VITAL AF 1.2 CAL) Liqd  Place 1,000 mLs into feeding tube continuous.     ferrous sulfate 300 (60 FE) MG/5ML syrup  Place 5 mLs (300 mg total) into feeding tube 2 (two) times daily with a meal.     free water Soln  Place 200 mLs into feeding tube every 6 (six) hours.     metoprolol tartrate 25 MG tablet  Commonly known as:  LOPRESSOR  3 tablets (75 mg total) by Per J Tube route 2 (two) times daily.     mirtazapine 15 MG disintegrating tablet  Commonly known as:  REMERON SOL-TAB  1 tablet (15 mg total) by Per J Tube route at bedtime.     ondansetron 4 MG tablet  Commonly known as:  ZOFRAN  1 tablet (4 mg total) by Per J Tube route every 6 (six) hours as needed for nausea.     ondansetron 4 MG/2ML Soln injection  Commonly known as:  ZOFRAN  Inject 2 mLs (4 mg total) into the vein every 6 (six) hours as needed for nausea.     pantoprazole sodium 40 mg/20 mL Pack  Commonly known as:  PROTONIX  Place 20 mLs (40 mg total) into feeding tube daily at 12 noon.     risperiDONE 0.5 MG disintegrating tablet  Commonly known as:  RISPERDAL M-TABS  Take 1 tablet (0.5 mg total) by mouth 2 (two) times daily.     sodium chloride 0.9 % SOLN 100 mL with levETIRAcetam 500 MG/5ML SOLN 1,500 mg  Inject 1,500 mg into the vein every 12 (twelve) hours.     sodium chloride 0.9 % SOLN 25 mL with lacosamide 200 MG/20ML SOLN 150 mg  Inject 150 mg into the vein every 12 (twelve) hours.     sodium chloride 0.9 % SOLN 250 mL with vancomycin 10 G SOLR 1,250 mg  Inject 1,250 mg into the vein every 12 (twelve) hours.     Valproic Acid 250 MG/5ML Syrp syrup  Commonly known as:  DEPAKENE  Place 15 mLs (750 mg total) into feeding tube every 8 (eight) hours.       Allergies  Allergen Reactions  . Morphine And Related Other (See Comments)    REACTION: Causes pain  . Paroxetine Hcl Other (See Comments)    REACTION:  unknown  .  Penicillins Other (See Comments)    REACTION: Makes her body hurt.  . Sertraline Hcl Other (See Comments)    REACTION:  unknown      The results of significant diagnostics from this hospitalization (including imaging, microbiology, ancillary and laboratory) are listed below for reference.    Significant Diagnostic Studies: Ct Abdomen Pelvis Wo Contrast  07/10/2013   CLINICAL DATA:  Sepsis question intra-abdominal source  EXAM: CT ABDOMEN AND PELVIS WITHOUT CONTRAST  TECHNIQUE: Multidetector CT imaging of the abdomen and pelvis was performed following the standard protocol without intravenous contrast. Sagittal and coronal MPR images reconstructed from axial data set. GI contrast administered  COMPARISON:  CT pelvis 05/17/2013; CT abdomen and pelvis 06/02/2011  FINDINGS: Small moderate-sized right pleural effusion with basilar atelectasis.  Minimal atelectasis left base.  Gallbladder surgically absent.  Prior gastric surgery.  Jejunostomy tube left mid abdomen, through which GI contrast was administered.  Dilated small bowel loops in the upper abdomen terminating at an anastomotic staple line in the left mid abdomen most compatible with small bowel obstruction ; the lack of dilatation of the remaining small bowel and colon makes ileus much less likely.  Small bowel measures up to 4.8 cm transverse mild bowel wall thickening of the dilated loops.  Distal small bowel is decompressed and unremarkable.  Small foci of gas in the left mid abdomen into the upper left pelvis appear to be within several small bowel loops which are nondistended and no definite free intraperitoneal air is identified.  Decompressed colon containing contrast and a rectal tube.  Unremarkable bladder and ovaries with surgical absence of uterus.  Small amount of free pelvic fluid.  Diffuse soft tissue edema/ anasarca.  No mass, adenopathy, or hernia.  Osseous structures unremarkable.  IMPRESSION: Dilated proximal small bowel loops  proximal to the jejunostomy site most consistent with small bowel obstruction, associated with mild bowel wall thickening and duodenal dilatation.  Decompressed distal small bowel and colon.  Small amount of nonspecific free pelvic fluid.   Electronically Signed   By: Ulyses Southward M.D.   On: 07/10/2013 17:38   Dg Abd 1 View  07/25/2013   CLINICAL DATA:  Nausea and vomiting.  EXAM: ABDOMEN - 1 VIEW  COMPARISON:  CT ABD/PELVIS W CM dated 07/20/2013; DG ABD 2 VIEWS dated 07/19/2013  FINDINGS: The visualized bowel gas pattern is unremarkable. Scattered air and stool filled loops of colon are seen; no abnormal dilatation of small bowel loops is seen to suggest small bowel obstruction. No free intra-abdominal air is identified, though evaluation for free air is limited on a single supine view. Clips are noted within the right upper quadrant, reflecting prior cholecystectomy.  The patient's J-tube is seen overlying the lower abdomen, with an associated mildly distended loop of jejunum.  The visualized osseous structures are within normal limits; the sacroiliac joints are unremarkable in appearance.  IMPRESSION: Unremarkable bowel gas pattern; no free intra-abdominal air seen. Mild jejunal distention in association with the patient's J-tube is is thought to be transient in nature.   Electronically Signed   By: Roanna Raider M.D.   On: 07/25/2013 06:39   Ct Head Wo Contrast  07/23/2013   CLINICAL DATA:  Mental status change.  EXAM: CT HEAD WITHOUT CONTRAST  TECHNIQUE: Contiguous axial images were obtained from the base of the skull through the vertex without intravenous contrast.  COMPARISON:  MR C SPINE W/O CM dated 06/22/2013  FINDINGS: No mass. No hydrocephalus. No hemorrhage. Orbits are intact. Paranasal sinuses are clear where visualized. Mastoids are clear. No acute bony abnormality.  IMPRESSION: No acute or focal abnormality.   Electronically Signed   By: Maisie Fus  Register   On: 07/23/2013 09:58   Ct Angio Chest  Pe W/cm &/or Wo Cm  07/17/2013   CLINICAL DATA:  Tachycardia.  Upper chest pain, shortness of breath.  EXAM: CT ANGIOGRAPHY CHEST WITH CONTRAST  TECHNIQUE: Multidetector CT imaging of the chest was performed using the standard protocol during bolus administration of intravenous contrast. Multiplanar CT image reconstructions and MIPs were obtained to evaluate the vascular anatomy.  CONTRAST:  70mL OMNIPAQUE IOHEXOL 350 MG/ML SOLN  COMPARISON:  DG CHEST 1V PORT dated 07/17/2013; CT ANGIO CHEST W/CM &/OR WO/CM dated 07/11/2013  FINDINGS: No filling defects in the pulmonary arteries to suggest pulmonary emboli. Heart is normal size. Aorta is normal caliber. No dissection.  Moderate left pleural effusion and large right pleural effusion. These are stable since prior study. Compressive atelectasis in the lower lobes bilaterally. No mediastinal, hilar or axillary adenopathy. Chest wall soft tissues are unremarkable. Imaging into the upper abdomen shows no acute findings.  Review of the MIP images confirms the above findings.  IMPRESSION: No evidence of pulmonary embolus.  Continued bilateral effusions, right greater than left, unchanged. Compressive atelectasis in the lower lobes.  No change since prior study.   Electronically Signed   By: Charlett Nose M.D.   On: 07/17/2013 06:48   Ct Angio Chest Pe W/cm &/or Wo Cm  07/11/2013   CLINICAL DATA:  Altered mental status, tachycardia, chest pain and shortness breath  EXAM: CT ANGIOGRAPHY CHEST WITH CONTRAST  TECHNIQUE: Multidetector CT imaging of the chest was performed using the standard protocol during bolus administration of intravenous contrast. Multiplanar CT image reconstructions and MIPs were obtained to evaluate the vascular anatomy.  CONTRAST:  OMNIPAQUE IOHEXOL 350 MG/ML SOLN  COMPARISON:  DG CHEST 1V PORT dated 07/11/2013; CT ABD/PELV WO CM dated 07/10/2013; CT CHEST W/CM dated 06/21/2013  FINDINGS: There are no filling defects within the pulmonary arteries to  suggest acute pulmonary embolism. Acute findings of the aorta or great vessels. No pericardial fluid.  There are bilateral pleural effusions, moderate to large on the right and small on the left. There is associated right lower lobe passive atelectasis.  Limited view of the upper abdomen is unremarkable. Limited view of the skeleton demonstrates degenerative spurring.  Review of the MIP images confirms the above findings.  IMPRESSION: 1. No evidence acute pulmonary embolism. 2. Moderate to large right pleural effusion with associated passive atelectasis. 3. No pericardial fluid.   Electronically Signed   By: Genevive Bi M.D.   On: 07/11/2013 17:53   Ct Abdomen Pelvis W Contrast  07/21/2013   CLINICAL DATA:  Tachycardia and dislodged J-tube. Abdominal pain and vomiting.  EXAM: CT ABDOMEN AND PELVIS WITH CONTRAST  TECHNIQUE: Multidetector CT imaging of the abdomen and pelvis was performed using the standard protocol following bolus administration of intravenous contrast.  CONTRAST:  OMNIPAQUE IOHEXOL 300 MG/ML  SOLN  COMPARISON:  DG ABD 2 VIEWS dated 07/19/2013; CT ABD/PELV WO CM dated 07/10/2013  FINDINGS: Small bilateral pleural effusions are seen, right greater than left, with associated bibasilar airspace opacification likely reflecting atelectasis.  The liver and spleen are unremarkable in appearance. The patient is status post cholecystectomy, with clips noted along the gallbladder fossa. The pancreas and adrenal glands are unremarkable.  The kidneys are unremarkable in appearance. There is no evidence of hydronephrosis. No renal or ureteral stones are seen. Minimal nonspecific perinephric stranding is noted on the left side.  A replacement J-tube is seen ending at the jejunum at the left lower quadrant. A bowel suture line is noted along the jejunum, with distention of more proximal jejunum to 4.7 cm in diameter. There is no definite evidence of bowel obstruction; contrast progresses to the level of  the  rectum.  No free fluid is identified. The small bowel is unremarkable in appearance. Postoperative change is noted about the stomach. No acute vascular abnormalities are seen. Postoperative change is seen along the midline anterior abdominal wall.  The appendix is normal in caliber and filled with contrast, without evidence for appendicitis. The colon is largely decompressed and unremarkable in appearance.  The bladder is mildly distended and grossly unremarkable. The patient is status post hysterectomy. The ovaries are relatively symmetric; no suspicious adnexal masses are seen. No inguinal lymphadenopathy is seen.  Mild diffuse soft tissue edema is seen along the abdominal wall and proximal lower extremities, raising question for mild anasarca.  No acute osseous abnormalities are identified.  IMPRESSION: 1. Replacement J-tube seen ending at the jejunum at the left lower quadrant. Likely chronic distention of more proximal jejunum to 4.7 cm in diameter, with associated bowel suture line, but no evidence of bowel obstruction. Ingested contrast progresses to the level of the rectum. 2. Small bilateral pleural effusions, right greater than left, with associated bibasilar airspace opacification likely reflecting atelectasis. 3. Mild diffuse soft tissue edema along the abdominal wall and proximal lower extremities, raising question for mild anasarca.   Electronically Signed   By: Roanna Raider M.D.   On: 07/21/2013 05:55   Ir Removal Tun Cv Cath W/o Fl  07/11/2013   CLINICAL DATA:  Sepsis, concern for right internal jugular tunneled central catheter infection, finished plasmapheresis treatment, request for removal.  EXAM: REMOVAL OF TUNNELED CENTRAL VENOUS CATHETER  PROCEDURE: Risks and benefits of procedure discussed with the patient's son over the phone, verbal consent was obtained.  The right chest tunneled central catheter site was prepped with chlorhexidine. A sterile gown and gloves were worn during the  procedure.  Utilizing manual manipulation, the subcutaneous cuff of the central catheter was freed. The catheter was then successfully removed in its entirety. A sterile dressing was applied over the catheter exit site.  IMPRESSION: Removal of tunneled right internal jugular central catheter utilizing manual manipulation. The procedure was uncomplicated.  Read By:  Pattricia Boss PA-C   Electronically Signed   By: Malachy Moan M.D.   On: 07/11/2013 11:39   Ir Replc Duoden/jejuno Tube Percut W/fluoro  07/13/2013   CLINICAL DATA:  Occluded surgical jejunostomy requiring exchange.  EXAM: JEJUNOSTOMY TUBE EXCHANGE  CONTRAST:  10 ml Omnipaque-300  FLUOROSCOPY TIME:  1 min and 24 seconds.  PROCEDURE: Informed consent was obtained by telephone from the patient's son.  The pre-existing jejunostomy and surrounding skin were prepped with Betadine. Attempt was made to inject contrast via the pre-existing tube. The tube was cut and removed over a hydrophilic guidewire. A new 12 French red rubber jejunostomy catheter was advanced over the wire.  Final catheter position was confirmed with a fluoroscopic spot image obtained after injection of contrast. The catheter was secured at the exit site with a silk retention suture.  COMPLICATIONS: None.  FINDINGS: The pre-existing 2 French tube is occluded. A new 12 French tube was placed and advanced into the jejunum.  IMPRESSION: Replacement of occluded 10 French jejunostomy catheter. The tube was up sized to a 12 [place] catheter advanced into the jejunum over a guidewire.   Electronically Signed   By: Irish Lack M.D.   On: 07/13/2013 15:54   Dg Chest Port 1 View  07/30/2013   CLINICAL DATA:  Followup infiltrates.  Cough  EXAM: PORTABLE CHEST - 1 VIEW  COMPARISON:  07/26/2013  FINDINGS: Progression of bibasilar airspace disease.  This may be atelectasis or pneumonia. Increase in small right effusion. Negative for edema.  IMPRESSION: Progression of bibasilar consolidation    Electronically Signed   By: Marlan Palau M.D.   On: 07/30/2013 08:10   Dg Chest Port 1 View  07/26/2013   CLINICAL DATA:  History of pneumonia, weakness, lethargy  EXAM: PORTABLE CHEST - 1 VIEW  COMPARISON:  Portable chest x-ray of 07/25/2013  FINDINGS: There is more opacity at both lung bases most consistent with developing pneumonia. Some of the opacity may be due to atelectasis and possible small effusions. Cardiomegaly is stable. There are degenerative changes throughout the thoracic spine.  IMPRESSION: Increase in opacity at the lung bases suspicious for pneumonia. Cannot exclude small effusions.   Electronically Signed   By: Dwyane Dee M.D.   On: 07/26/2013 16:15   Dg Chest Port 1 View  07/25/2013   CLINICAL DATA:  Fever and seizure  EXAM: PORTABLE CHEST - 1 VIEW  COMPARISON:  Prior radiograph from 07/20/2013  FINDINGS: Cardiac and mediastinal silhouettes are stable in size and contour, and remain within normal limits. Right-sided PICC catheter has been removed.  Lungs are normally inflated. Previously seen bilateral pleural effusions are no longer visualized. There is persistent patchy left basilar opacity, suspicious for possible infiltrate or atelectasis. No other focal airspace disease identified. No pulmonary edema or pneumothorax.  Degenerative changes noted within the thoracic spine.  IMPRESSION: 1. Persistent patchy left basilar opacity. While this finding may in part reflect residual atelectasis, possible infiltrate could be considered given the history of fever. 2. Interval resolution in bilateral pleural effusions.   Electronically Signed   By: Rise Mu M.D.   On: 07/25/2013 01:38   Dg Chest Port 1 View  07/20/2013   CLINICAL DATA:  Possible aspiration.  EXAM: PORTABLE CHEST - 1 VIEW  COMPARISON:  Chest radiograph and CTA of the chest performed 07/17/2013  FINDINGS: The patient's right PICC is noted ending about the distal SVC.  The previously noted bilateral pleural  effusions have improved. Bibasilar airspace opacity likely reflects residual atelectasis, without definite evidence of aspiration. No pneumothorax is seen.  The cardiomediastinal silhouette is borderline normal in size. No acute osseous abnormalities are identified.  IMPRESSION: Previously noted bilateral pleural effusions have improved. Bibasilar airspace opacity likely reflects residual atelectasis, without definite evidence of aspiration.   Electronically Signed   By: Roanna Raider M.D.   On: 07/20/2013 03:35   Dg Chest Portable 1 View  07/17/2013   CLINICAL DATA:  Abdominal pain, tachycardia.  EXAM: PORTABLE CHEST - 1 VIEW  COMPARISON:  CT ANGIO CHEST W/CM &/OR WO/CM dated 07/11/2013; DG CHEST 1V PORT dated 07/11/2013  FINDINGS: Moderate right pleural effusion, likely slightly increased since prior study. Interval removal of right hemodialysis catheter. Right PICC line is unchanged. Patchy right lung airspace disease and left basilar airspace disease medially, both slightly increased since prior study. No acute bony abnormality.  IMPRESSION: Increasing patchy bilateral airspace disease as above. Increasing moderate right effusion.   Electronically Signed   By: Charlett Nose M.D.   On: 07/17/2013 03:54   Dg Chest Port 1 View  07/11/2013   CLINICAL DATA:  Shortness of breath, evaluate pulmonary infiltrates  EXAM: PORTABLE CHEST - 1 VIEW  COMPARISON:  DG CHEST 1V PORT dated 07/10/2013; DG CHEST 1V PORT dated 07/03/2013; CT CHEST W/CM dated 06/21/2013; IR FLUORO GUIDE CV LINE*R* dated 06/20/2013; DG CHEST 1V PORT dated 05/11/2013  FINDINGS: Grossly unchanged cardiac silhouette and mediastinal  contours given patient rotation. Stable position of support apparatus. Grossly unchanged right mid lung heterogeneous airspace opacities with worsening bilateral medial basilar airspace opacities. No definite pleural effusion or pneumothorax. No evidence of edema. A minimal amount of enteric contrast is seen within in the  gastric fundus. Post cholecystectomy. Grossly unchanged bones.  IMPRESSION: 1.  Stable positioning of support apparatus.  No pneumothorax. 2. Worsening bibasilar airspace disease, possibly atelectasis though progression of multifocal infection / aspiration is not excluded. A follow-up chest radiograph in 4 to 6 weeks after treatment is recommended to ensure resolution.   Electronically Signed   By: Simonne Come M.D.   On: 07/11/2013 07:47   Dg Chest Port 1 View  07/10/2013   CLINICAL DATA:  Shortness of breath.  Hypotension  EXAM: PORTABLE CHEST - 1 VIEW  COMPARISON:  DG CHEST 1V PORT dated 07/03/2013; CT CHEST W/CM dated 06/21/2013  FINDINGS: Right internal jugular dialysis catheter tip: Right atrium. Thoracic spondylosis noted. There is a band of airspace opacity in the right upper lobe just above the minor fissure. Improved aeration at the right lung base.  IMPRESSION: Improved aeration at the right lung base, with but there is a band of airspace opacity in the right upper lobe favoring atelectasis over pneumonia. Next item thoracic spondylosis.   Electronically Signed   By: Herbie Baltimore M.D.   On: 07/10/2013 08:27   Dg Chest Port 1 View  07/03/2013   CLINICAL DATA:  Tubes and lines in good position, detailed above.  EXAM: PORTABLE CHEST - 1 VIEW  COMPARISON:  Chest CT 06/21/2013  FINDINGS: There is nasogastric tube which terminates near the pylorus. Right IJ catheter, tip in the upper right atrium. Right upper extremity PICC, tip at the upper cavoatrial junction.  Oral contrast noted within the gastric fundus and small bowel.  Haziness of the right base, with volume loss. Node definitive consolidation.  No cardiomegaly.  IMPRESSION: 1. Negative for pneumonia. 2. Haziness of the right lower chest, likely pleural fluid or atelectasis. 3. Tubes and lines in good position, detailed above.   Electronically Signed   By: Tiburcio Pea M.D.   On: 07/03/2013 03:35   Dg Abd 2 Views  07/19/2013   CLINICAL DATA:   Abdominal pain and vomiting, recent bowel resection.  EXAM: ABDOMEN - 2 VIEW  COMPARISON:  DG ABD PORTABLE 1V dated 07/17/2013  FINDINGS: Bowel gas pattern appears nondilated and nonobstructive, with an overall paucity of bowel gas. A few scattered air-fluid levels seen on the lateral decubitus views. No intraperitoneal free air.  Jejunostomy tube projects and left lower quadrant, no residual enteric contrast. Surgical staple line projects in left mid abdomen. Surgical clips in the right upper quadrant likely reflect cholecystectomy. Soft tissue planes and included osseous structures are nonsuspicious.  IMPRESSION: A few scattered air-fluid levels may reflect mild ileus, less likely or early bowel obstruction.  Jejunostomy tube in place without residual enteric contrast.   Electronically Signed   By: Awilda Metro   On: 07/19/2013 22:27   Dg Abd Portable 1v  07/17/2013   CLINICAL DATA:  J-tube placement.  EXAM: PORTABLE ABDOMEN - 1 VIEW  COMPARISON:  07/13/2013  FINDINGS: Contrast was injected through the jejunostomy tube. This opacifies small bowel loops in the upper pelvis. No evidence of contrast extravasation.  Slightly prominent bowel loops or superiorly in the abdomen, nonspecific. Prior cholecystectomy. No free air.  IMPRESSION: Jejunostomy tube within upper pelvic small bowel loops. Mildly prominent abdominal small bowel loops  which could reflect mild focal ileus although early or partial small bowel obstruction cannot be excluded.   Electronically Signed   By: Charlett NoseKevin  Dover M.D.   On: 07/17/2013 03:57   Dg Abd Portable 1v  07/12/2013   CLINICAL DATA:  Abdominal pain  EXAM: PORTABLE ABDOMEN - 1 VIEW  COMPARISON:  07/11/2013  FINDINGS: Postsurgical changes are again seen. A feeding catheter is noted. Scattered large and small bowel gas is seen. No obstructive changes are noted. No definitive free air is seen.  IMPRESSION: No significant change from the prior exam.   Electronically Signed   By: Alcide CleverMark   Lukens M.D.   On: 07/12/2013 07:44   Dg Abd Portable 1v  07/11/2013   CLINICAL DATA:  Abdominal distention, evaluate for small bowel obstruction  EXAM: PORTABLE ABDOMEN - 1 VIEW  COMPARISON:  CT ABD/PELV WO CM dated 07/10/2013; DG ABD PORTABLE 1V dated 07/10/2013; DG ABD PORTABLE 1V dated 06/30/2013  FINDINGS: There has been passage of the majority of previously administered enteric contrast. A minimal amount of enteric contrast remains within the colon. Paucity of bowel gas without definite evidence of obstruction. Nondiagnostic evaluation pneumoperitoneum secondary supine positioning exclusion of lower thorax. No definite pneumatosis or portal venous gas.  A feeding jejunostomy tube overlies the left mid/ lower abdomen.  A vertically aligned skin staples overlie the right mid abdomen. Post cholecystectomy.  Unchanged bones.  IMPRESSION: Paucity of bowel gas, however there has been apparent passage of previously ingested enteric contrast. No definite evidence of obstruction.   Electronically Signed   By: Simonne ComeJohn  Watts M.D.   On: 07/11/2013 08:26   Dg Abd Portable 1v  07/10/2013   CLINICAL DATA:  Assess for ileus  EXAM: PORTABLE ABDOMEN - 1 VIEW  COMPARISON:  June 30, 2013  FINDINGS: There is persistent air-filled dilated small bowel loops in the left abdomen. Air is noted in the colon. Skin staples are projected over the right abdomen. Patient is status post prior cholecystectomy.  IMPRESSION: Persistent ileus.   Electronically Signed   By: Sherian ReinWei-Chen  Lin M.D.   On: 07/10/2013 02:30   Dg Abd Portable 1v  06/30/2013   CLINICAL DATA:  NG tube placement.  EXAM: PORTABLE ABDOMEN - 1 VIEW  COMPARISON:  Single view of the abdomen 06/27/2013. Upper GI/small bowel follow-through 06/28/2013  FINDINGS: NG tube is in place in good position with the tip in this distal stomach. Contrast material in small bowel from the comparison upper GI series/small bowel follow-through. Small bowel loops are dilated. Only a small amount  of loculated barium is seen in the colon.  IMPRESSION: NG tube in good position.  Small bowel obstruction.   Electronically Signed   By: Drusilla Kannerhomas  Dalessio M.D.   On: 06/30/2013 19:01    Microbiology: Recent Results (from the past 240 hour(s))  URINE CULTURE     Status: None   Collection Time    07/24/13  1:28 AM      Result Value Ref Range Status   Specimen Description URINE, CATHETERIZED   Final   Special Requests NONE   Final   Culture  Setup Time     Final   Value: 07/24/2013 08:19     Performed at [facility]   Colony Count     Final   Value: NO GROWTH     [facility]formed at [facility]   Culture     Final   Value: NO GROWTH     Performed at [facility]   Report Status 07/25/2013 FINAL   Final  CULTURE, BLOOD (ROUTINE X 2)     Status: None   Collection Time    07/25/13  2:38 AM      Result Value Ref Range Status   Specimen Description BLOOD RIGHT ARM   Final   Special Requests BOTTLES DRAWN AEROBIC ONLY [facility]   Final   Culture  Setup Time     Final   Value: 07/25/2013 09:54     Performed at [facility]   Culture     Final   Value:        BLOOD CULTURE RECEIVED NO GROWTH TO DATE CULTURE WILL BE HELD FOR 5 DAYS BEFORE ISSUING A FINAL NEGATIVE REPORT     Performed at [facility]   Report Status PENDING   Incomplete  CULTURE, BLOOD (ROUTINE X 2)     Status: None   Collection Time    07/25/13  2:45 AM      Result Value Ref Range Status   Specimen Description BLOOD RIGHT HAND   Final   Special Requests     Final   Value: BOTTLES DRAWN AEROBIC AND ANAEROBIC 6CC AEROBIC 4CC ANAEROBIC   Culture  Setup Time     Final   Value: 07/25/2013 09:54     Performed at [facility]   Culture     Final   Value:        BLOOD CULTURE RECEIVED NO GROWTH TO DATE CULTURE WILL BE HELD FOR 5 DAYS BEFORE ISSUING A FINAL NEGATIVE REPORT     Performed at [facility]   Report Status PENDING   Incomplete  MRSA PCR SCREENING     Status: None    Collection Time    07/25/13  5:39 AM      Result Value Ref Range Status   MRSA by PCR NEGATIVE  NEGATIVE Final   Comment:            The GeneXpert MRSA Assay (FDA     approved for NASAL specimens     only), is one component of a     comprehensive MRSA colonization     surveillance program. It is not     intended to diagnose MRSA     infection nor to guide or     monitor treatment for     MRSA infections.     Labs: Basic Metabolic Panel:  Recent Labs Lab 07/26/13 0233 07/27/13 0932 07/28/13 0914 07/29/13 0657 07/30/13 0244  NA 141 142 144 146 145  K 3.6* 3.6* 3.3* 3.3* 5.1  CL 106 107 108 109 109  CO2 25 27 27 28 29   GLUCOSE 88 76 91 103* 98  BUN 7 7 7 8 6   CREATININE 0.42* 0.36* 0.33* 0.34* 0.40*  CALCIUM 8.3* 8.2* 8.1* 8.0* 8.3*  MG  --   --  1.9  --   --    Liver Function Tests:  Recent Labs Lab 07/24/13 2304 07/25/13 0642  AST 46* 24  ALT 16 11  ALKPHOS 82 68  BILITOT 0.4 0.3  PROT 5.5* 4.6*  ALBUMIN 2.7* 2.2*    Recent Labs Lab 07/24/13 2304  LIPASE 11   No results found for this basename: AMMONIA,  in the last 168 hours CBC:  Recent Labs Lab 07/24/13 2304 07/25/13 0642 07/28/13 0914  WBC 7.5 11.4* 8.1  NEUTROABS 5.1 8.8*  --   HGB 11.3* 10.4* 9.8*  HCT 34.4*  31.4* 29.6*  MCV 97.2 98.1 97.4  PLT 176 154 143*   Cardiac Enzymes: No results found for this basename: CKTOTAL, CKMB, CKMBINDEX, TROPONINI,  in the last 168 hours BNP: BNP (last 3 results) No results found for this basename: PROBNP,  in the last 8760 hours CBG:  Recent Labs Lab 07/29/13 1234 07/29/13 1850 07/30/13 0013 07/30/13 0800 07/30/13 1157  GLUCAP 91 79 99 100* 99       Signed:  ELLIS,ALLISON L. ANP [facility] 07/30/2013, 2:11 PM   I have examined the patient, reviewed the chart and modified the above note which I agree with.   Adriel Desrosier,MD Pager # [email or web address] 08/30/2013, 3:14 PM

## 2013-07-30 NOTE — Progress Notes (Signed)
RNCM informed CSW [facility]. CSW signing off.   Maryclare LabradorJulie Caelin Rayl, MSW, [facility] Clinical Social Worker [phone]

## 2013-07-31 LAB — CULTURE, BLOOD (ROUTINE X 2)
CULTURE: NO GROWTH
Culture: NO GROWTH

## 2013-08-20 ENCOUNTER — Telehealth: Payer: Self-pay | Admitting: *Deleted

## 2013-08-20 NOTE — Telephone Encounter (Signed)
Diagnosis was not definite. Will address at upcoming visit

## 2013-08-20 NOTE — Telephone Encounter (Signed)
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  F/u for this.  ? False positive due to diagnosed with encephalopathy?  Has questions about pts progress (and other problems associated (would she have progressive dementia, myoclonus, vision, cerebellar problems)??  Faythe GheeSee's L Lam, NP 08-23-13.  [phone].  LMVM for Tammy about upcoming pt appt on the 27th.

## 2013-08-23 ENCOUNTER — Ambulatory Visit: Payer: BC Managed Care – PPO | Admitting: Nurse Practitioner

## 2013-08-23 ENCOUNTER — Encounter (HOSPITAL_COMMUNITY): Payer: Self-pay | Admitting: Emergency Medicine

## 2013-08-23 ENCOUNTER — Observation Stay (HOSPITAL_COMMUNITY)
Admission: EM | Admit: 2013-08-23 | Discharge: 2013-08-27 | Disposition: A | Discharge status: Skilled Nursing Facility | Payer: BC Managed Care – PPO | Attending: Internal Medicine | Admitting: Internal Medicine

## 2013-08-23 DIAGNOSIS — Z9884 Bariatric surgery status: Secondary | ICD-10-CM | POA: Insufficient documentation

## 2013-08-23 DIAGNOSIS — G928 Other toxic encephalopathy: Secondary | ICD-10-CM

## 2013-08-23 DIAGNOSIS — Y9210 Unspecified residential institution as the place of occurrence of the external cause: Secondary | ICD-10-CM | POA: Insufficient documentation

## 2013-08-23 DIAGNOSIS — Z9049 Acquired absence of other specified parts of digestive tract: Secondary | ICD-10-CM

## 2013-08-23 DIAGNOSIS — G9340 Encephalopathy, unspecified: Secondary | ICD-10-CM

## 2013-08-23 DIAGNOSIS — E872 Acidosis, unspecified: Secondary | ICD-10-CM

## 2013-08-23 DIAGNOSIS — J189 Pneumonia, unspecified organism: Secondary | ICD-10-CM

## 2013-08-23 DIAGNOSIS — K9413 Enterostomy malfunction: Secondary | ICD-10-CM

## 2013-08-23 DIAGNOSIS — G92 Toxic encephalopathy: Secondary | ICD-10-CM

## 2013-08-23 DIAGNOSIS — L8991 Pressure ulcer of unspecified site, stage 1: Secondary | ICD-10-CM | POA: Insufficient documentation

## 2013-08-23 DIAGNOSIS — G609 Hereditary and idiopathic neuropathy, unspecified: Secondary | ICD-10-CM

## 2013-08-23 DIAGNOSIS — R1110 Vomiting, unspecified: Secondary | ICD-10-CM

## 2013-08-23 DIAGNOSIS — R569 Unspecified convulsions: Secondary | ICD-10-CM

## 2013-08-23 DIAGNOSIS — E860 Dehydration: Secondary | ICD-10-CM

## 2013-08-23 DIAGNOSIS — R532 Functional quadriplegia: Secondary | ICD-10-CM | POA: Insufficient documentation

## 2013-08-23 DIAGNOSIS — F411 Generalized anxiety disorder: Secondary | ICD-10-CM | POA: Insufficient documentation

## 2013-08-23 DIAGNOSIS — E870 Hyperosmolality and hypernatremia: Secondary | ICD-10-CM

## 2013-08-23 DIAGNOSIS — G40802 Other epilepsy, not intractable, without status epilepticus: Secondary | ICD-10-CM | POA: Insufficient documentation

## 2013-08-23 DIAGNOSIS — R112 Nausea with vomiting, unspecified: Secondary | ICD-10-CM

## 2013-08-23 DIAGNOSIS — A419 Sepsis, unspecified organism: Secondary | ICD-10-CM

## 2013-08-23 DIAGNOSIS — IMO0001 Reserved for inherently not codable concepts without codable children: Secondary | ICD-10-CM | POA: Insufficient documentation

## 2013-08-23 DIAGNOSIS — R404 Transient alteration of awareness: Secondary | ICD-10-CM | POA: Insufficient documentation

## 2013-08-23 DIAGNOSIS — D649 Anemia, unspecified: Secondary | ICD-10-CM

## 2013-08-23 DIAGNOSIS — IMO0002 Reserved for concepts with insufficient information to code with codable children: Principal | ICD-10-CM | POA: Insufficient documentation

## 2013-08-23 DIAGNOSIS — A048 Other specified bacterial intestinal infections: Secondary | ICD-10-CM

## 2013-08-23 DIAGNOSIS — I10 Essential (primary) hypertension: Secondary | ICD-10-CM

## 2013-08-23 DIAGNOSIS — K9420 Gastrostomy complication, unspecified: Secondary | ICD-10-CM

## 2013-08-23 DIAGNOSIS — G9349 Other encephalopathy: Secondary | ICD-10-CM | POA: Insufficient documentation

## 2013-08-23 DIAGNOSIS — E876 Hypokalemia: Secondary | ICD-10-CM

## 2013-08-23 DIAGNOSIS — B370 Candidal stomatitis: Secondary | ICD-10-CM | POA: Insufficient documentation

## 2013-08-23 DIAGNOSIS — R000 Tachycardia, unspecified: Secondary | ICD-10-CM

## 2013-08-23 DIAGNOSIS — N390 Urinary tract infection, site not specified: Secondary | ICD-10-CM

## 2013-08-23 DIAGNOSIS — K9423 Gastrostomy malfunction: Secondary | ICD-10-CM

## 2013-08-23 DIAGNOSIS — R197 Diarrhea, unspecified: Secondary | ICD-10-CM

## 2013-08-23 DIAGNOSIS — Y833 Surgical operation with formation of external stoma as the cause of abnormal reaction of the patient, or of later complication, without mention of misadventure at the time of the procedure: Secondary | ICD-10-CM | POA: Insufficient documentation

## 2013-08-23 DIAGNOSIS — N179 Acute kidney failure, unspecified: Secondary | ICD-10-CM

## 2013-08-23 DIAGNOSIS — L89109 Pressure ulcer of unspecified part of back, unspecified stage: Secondary | ICD-10-CM | POA: Insufficient documentation

## 2013-08-23 DIAGNOSIS — R6510 Systemic inflammatory response syndrome (SIRS) of non-infectious origin without acute organ dysfunction: Secondary | ICD-10-CM

## 2013-08-23 DIAGNOSIS — D696 Thrombocytopenia, unspecified: Secondary | ICD-10-CM | POA: Insufficient documentation

## 2013-08-23 DIAGNOSIS — F29 Unspecified psychosis not due to a substance or known physiological condition: Secondary | ICD-10-CM

## 2013-08-23 LAB — CBC WITH DIFFERENTIAL/PLATELET
BASOS ABS: 0 10*3/uL (ref 0.0–0.1)
BASOS PCT: 0 % (ref 0–1)
EOS ABS: 0 10*3/uL (ref 0.0–0.7)
Eosinophils Relative: 1 % (ref 0–5)
HCT: 38.6 % (ref 36.0–46.0)
Hemoglobin: 13.2 g/dL (ref 12.0–15.0)
Lymphocytes Relative: 27 % (ref 12–46)
Lymphs Abs: 1.9 10*3/uL (ref 0.7–4.0)
MCH: 33.3 pg (ref 26.0–34.0)
MCHC: 34.2 g/dL (ref 30.0–36.0)
MCV: 97.5 fL (ref 78.0–100.0)
MONOS PCT: 12 % (ref 3–12)
Monocytes Absolute: 0.8 10*3/uL (ref 0.1–1.0)
NEUTROS ABS: 4.3 10*3/uL (ref 1.7–7.7)
Neutrophils Relative %: 61 % (ref 43–77)
PLATELETS: 160 10*3/uL (ref 150–400)
RBC: 3.96 MIL/uL (ref 3.87–5.11)
RDW: 14.4 % (ref 11.5–15.5)
WBC: 7.1 10*3/uL (ref 4.0–10.5)

## 2013-08-23 LAB — VALPROIC ACID LEVEL: VALPROIC ACID LVL: 59.5 ug/mL (ref 50.0–100.0)

## 2013-08-23 LAB — COMPREHENSIVE METABOLIC PANEL
ALBUMIN: 3.1 g/dL — AB (ref 3.5–5.2)
ALK PHOS: 59 U/L (ref 39–117)
ALT: 18 U/L (ref 0–35)
AST: 31 U/L (ref 0–37)
BUN: 15 mg/dL (ref 6–23)
CO2: 29 mEq/L (ref 19–32)
Calcium: 9.7 mg/dL (ref 8.4–10.5)
Chloride: 97 mEq/L (ref 96–112)
Creatinine, Ser: 0.33 mg/dL — ABNORMAL LOW (ref 0.50–1.10)
GFR calc Af Amer: >90 mL/min (ref >90)
GFR calc non Af Amer: >90 mL/min (ref >90)
Glucose, Bld: 83 mg/dL (ref 70–99)
POTASSIUM: 4.6 meq/L (ref 3.7–5.3)
SODIUM: 139 meq/L (ref 137–147)
TOTAL PROTEIN: 6.6 g/dL (ref 6.0–8.3)
Total Bilirubin: 0.3 mg/dL (ref 0.3–1.2)

## 2013-08-23 LAB — MRSA PCR SCREENING: MRSA BY PCR: NEGATIVE

## 2013-08-23 MED ORDER — HALOPERIDOL LACTATE 5 MG/ML IJ SOLN
1.0000 mg | Freq: Two times a day (BID) | INTRAMUSCULAR | Status: DC
  Administered 2013-08-23 – 2013-08-27 (×8): 1 mg via INTRAVENOUS
  Filled 2013-08-23: qty 0.2
  Filled 2013-08-23: qty 1
  Filled 2013-08-23 (×3): qty 0.2
  Filled 2013-08-23: qty 1
  Filled 2013-08-23 (×4): qty 0.2
  Filled 2013-08-23: qty 1

## 2013-08-23 MED ORDER — LORAZEPAM 2 MG/ML IJ SOLN
0.5000 mg | Freq: Four times a day (QID) | INTRAMUSCULAR | Status: DC | PRN
  Administered 2013-08-25 – 2013-08-26 (×2): 1 mg via INTRAVENOUS
  Filled 2013-08-23 (×2): qty 1

## 2013-08-23 MED ORDER — ENOXAPARIN SODIUM 40 MG/0.4ML ~~LOC~~ SOLN
40.0000 mg | SUBCUTANEOUS | Status: DC
  Administered 2013-08-23: 40 mg via SUBCUTANEOUS
  Filled 2013-08-23 (×2): qty 0.4

## 2013-08-23 MED ORDER — PANTOPRAZOLE SODIUM 40 MG IV SOLR
40.0000 mg | Freq: Two times a day (BID) | INTRAVENOUS | Status: DC
  Administered 2013-08-23 – 2013-08-27 (×8): 40 mg via INTRAVENOUS
  Filled 2013-08-23 (×10): qty 40

## 2013-08-23 MED ORDER — LACOSAMIDE 200 MG/20ML IV SOLN
200.0000 mg | Freq: Two times a day (BID) | INTRAVENOUS | Status: DC
  Administered 2013-08-23 – 2013-08-27 (×8): 200 mg via INTRAVENOUS
  Filled 2013-08-23 (×16): qty 20

## 2013-08-23 MED ORDER — SODIUM CHLORIDE 0.9 % IV SOLN
1500.0000 mg | Freq: Two times a day (BID) | INTRAVENOUS | Status: DC
  Administered 2013-08-23 – 2013-08-27 (×8): 1500 mg via INTRAVENOUS
  Filled 2013-08-23 (×11): qty 15

## 2013-08-23 MED ORDER — METOPROLOL TARTRATE 1 MG/ML IV SOLN: 12.5000 mg | Freq: Four times a day (QID) | INTRAVENOUS | Status: DC

## 2013-08-23 MED ORDER — MORPHINE SULFATE 2 MG/ML IJ SOLN
2.0000 mg | Freq: Four times a day (QID) | INTRAMUSCULAR | Status: DC | PRN
  Administered 2013-08-24 – 2013-08-27 (×2): 2 mg via INTRAVENOUS
  Filled 2013-08-23 (×3): qty 1

## 2013-08-23 MED ORDER — VALPROATE SODIUM 500 MG/5ML IV SOLN
750.0000 mg | Freq: Three times a day (TID) | INTRAVENOUS | Status: DC
  Administered 2013-08-23 – 2013-08-27 (×12): 750 mg via INTRAVENOUS
  Filled 2013-08-23 (×18): qty 7.5

## 2013-08-23 MED ORDER — DEXTROSE-NACL 5-0.45 % IV SOLN
INTRAVENOUS | Status: DC
  Administered 2013-08-23 – 2013-08-24 (×2): via INTRAVENOUS

## 2013-08-23 MED ORDER — METOPROLOL TARTRATE 1 MG/ML IV SOLN
5.0000 mg | Freq: Four times a day (QID) | INTRAVENOUS | Status: DC
  Administered 2013-08-24 – 2013-08-27 (×11): 5 mg via INTRAVENOUS
  Filled 2013-08-23 (×14): qty 5

## 2013-08-23 NOTE — H&P (Addendum)
Hospitalist Admission History and Physical  Patient name: Joan PeelingLora Featherly Medical record number: [id number] Date of birth: 10/14/1960 Age: 53 y.o. Gender: female  Primary Care Provider: Karlene EinsteinASANAYAKA,[name], MD  Chief Complaint: displaced feeding tube  History of Present Illness:This is a 53 y.o. year old female with multiple medical problems including seizure, chronic encephalopathy, small bowel resection with jejunostomy feeding tube presenting with displaced feeding tube from SNF. Level V caveat as patient has chronic encephalopathy. Patient is noted to have a rather extensive recent medical history with noted recent admission February 25 through March 3 for acute hypoxic respiratory failure secondary to HCAP, recurrent seizure,  Tachycardia. Please see discharge summary for full details. Patient was discharged to the skilled nursing facility with a jejunostomy tube in place for enteral feedings as well as medications. Per report, jejunostomy tube fell out earlier today. There has been no reported seizure activity, respiratory distress, diarrhea, vomiting. In the ER, emergency room physician discussed case with IR. They plan to replace the J-tube first thing in the morning. Case was also discussed with on-call surgery. They feel that IR replacement of J-tube would be a better procedure as patient has had an extensive prior abdominal surgery history. However, they will operate if necessary if interventional radiology is unable to replace the J-tube. Labs including a CBC, CMP, Valproate level are currently pending. Patient is otherwise hemodynamically stable.  Patient Active Problem List   Diagnosis Date Noted  . Complication of feeding tube 08/23/2013  . HCAP (healthcare-associated pneumonia) 07/25/2013  . Seizures 07/25/2013  . Tachycardia 07/25/2013  . Anemia 07/25/2013  . Seizure 07/25/2013  . Recurrent vomiting 07/21/2013  . SIRS (systemic inflammatory response syndrome) 07/17/2013  . Malfunction of  jejunostomy tube 07/03/2013  . S/P small bowel resection 07/03/2013  . Nausea with vomiting 06/27/2013  . Psychosis 06/21/2013  . Toxic metabolic encephalopathy 06/19/2013  . Unspecified hereditary and idiopathic peripheral neuropathy 06/14/2013  . Diarrhea 06/05/2013  . Essential hypertension, benign 05/31/2013  . Hypokalemia 05/25/2013  . Hypomagnesemia 05/25/2013  . Clostridial gastroenteritis 05/20/2013  . Convulsions/seizures 05/20/2013  . UTI (lower urinary tract infection) 05/11/2013  . Sepsis 05/11/2013  . Acute encephalopathy 05/11/2013  . Dehydration 05/11/2013  . Hypernatremia 05/11/2013  . Metabolic acidosis 05/11/2013  . AKI (acute kidney injury) 05/11/2013   Past Medical History: Past Medical History  Diagnosis Date  . Peripheral neuropathy   . Fibromyalgia   . Hypertension   . Anxiety   . Catatonia   . Seizures     Past Surgical History: Past Surgical History  Procedure Laterality Date  . Gastric bypass    . Abdominal hysterectomy    . Jejunostomy N/A 06/25/2013    Procedure:  OPEN JEJUNOSTOMY FEEDING TUBE ;  Surgeon: Cherylynn RidgesJames O [name], MD;  [facility];  Service: General;  Laterality: N/A;  . Laparotomy N/A 07/02/2013    Procedure: EXPLORATORY LAPAROTOMY with revision of jejunostomy feeding tube.;  Surgeon: Cherylynn RidgesJames O [name], MD;  [facility];  Service: General;  Laterality: N/A;  . Bowel resection N/A 07/02/2013    Procedure: SMALL BOWEL RESECTION;  Surgeon: Cherylynn RidgesJames O [name], MD;  [facility];  Service: General;  Laterality: N/A;    Social History: History   Social History  . Marital Status: Single    Spouse Name: N/A    Number of Children: N/A  . Years of Education: N/A   Social History Main Topics  . Smoking status: Never Smoker   . Smokeless tobacco: None  .  Alcohol Use: No  . Drug Use: None  . Sexual Activity: None   Other Topics Concern  . None   Social History Narrative  . None    Family History: Family History  Problem Relation  Age of Onset  . Hypertension Mother   . Diabetes Father     Allergies: Allergies  Allergen Reactions  . Morphine And Related Other (See Comments)    REACTION: Causes pain  . Paroxetine Hcl Other (See Comments)    REACTION:  unknown  . Penicillins Other (See Comments)    REACTION: Makes her body hurt.  . Sertraline Hcl Other (See Comments)    REACTION:  unknown    Current Facility-Administered Medications  Medication Dose Route Frequency Provider Last Rate Last Dose  . dextrose 5 %-0.45 % sodium chloride infusion   Intravenous Continuous Doree Albee, MD      . enoxaparin (LOVENOX) injection 40 mg  40 mg Subcutaneous Q24H Doree Albee, MD      . haloperidol lactate (HALDOL) injection 1 mg  1 mg Intravenous Q12H Doree Albee, MD      . lacosamide (VIMPAT) 200 mg in sodium chloride 0.9 % 25 mL IVPB  200 mg Intravenous Q12H Doree Albee, MD      . levETIRAcetam (KEPPRA) 1,500 mg in sodium chloride 0.9 % 100 mL IVPB  1,500 mg Intravenous Q12H Doree Albee, MD      . LORazepam (ATIVAN) injection 0.5-1 mg  0.5-1 mg Intravenous Q6H PRN Doree Albee, MD      . morphine 2 MG/ML injection 2-4 mg  2-4 mg Intravenous Q6H PRN Doree Albee, MD      . pantoprazole (PROTONIX) injection 40 mg  40 mg Intravenous Q12H Doree Albee, MD       Current Outpatient Prescriptions  Medication Sig Dispense Refill  . acetaminophen (TYLENOL) 325 MG tablet 2 tablets (650 mg total) by Per J Tube route every 6 (six) hours as needed for mild pain (or Fever >/= 101).      . cholestyramine (QUESTRAN) 4 G packet Take 4 g by mouth every 8 (eight) hours.      . clonazePAM (KLONOPIN) 0.5 MG tablet Take 0.5 mg by mouth every 8 (eight) hours as needed for anxiety.      . enoxaparin (LOVENOX) 40 MG/0.4ML injection Inject 40 mg into the skin daily.      [name] HYDROcodone-acetaminophen (NORCO/VICODIN) 5-325 MG per tablet Take 1 tablet by mouth every 6 (six) hours as needed for moderate pain.      [name] lacosamide (VIMPAT) 200  MG TABS tablet Place 200 mg into feeding tube 2 (two) times daily.      [name] levETIRAcetam (KEPPRA) 500 MG tablet Place 1,500 mg into feeding tube 2 (two) times daily.      . metoprolol (LOPRESSOR) 25 MG tablet 3 tablets (75 mg total) by Per J Tube route 2 (two) times daily.      . mirtazapine (REMERON SOL-TAB) 15 MG disintegrating tablet 1 tablet (15 mg total) by Per J Tube route at bedtime.      . Nutritional Supplements (FEEDING SUPPLEMENT, VITAL AF 1.2 CAL,) LIQD Place 1,000 mLs into feeding tube continuous.      [name] oxycodone (OXY-IR) 5 MG capsule Take 5 mg by mouth every 8 (eight) hours as needed for pain.      . pantoprazole sodium (PROTONIX) 40 mg/20 mL PACK Place 20 mLs (40 mg total) into feeding tube daily at 12 noon.  30 each    .  risperiDONE (RISPERDAL M-TABS) 0.5 MG disintegrating tablet Take 1 tablet (0.5 mg total) by mouth 2 (two) times daily.      . Valproic Acid (DEPAKENE) 250 MG/5ML SYRP syrup Place 15 mLs (750 mg total) into feeding tube every 8 (eight) hours.  600 mL    . Water For Irrigation, Sterile (FREE WATER) SOLN Place 200 mLs into feeding tube every 6 (six) hours.       Review Of Systems: 12 point ROS negative except as noted above in HPI.  Physical Exam: Filed Vitals:   08/23/13 1807  BP:   Pulse: 97  Temp:   Resp: 18    General: NAD, intermittently responsive to questions/commands  HEENT: PERRLA and extra ocular movement intact Heart: S1, S2 normal, no murmur, rub or gallop, regular rate and rhythm Lungs: clear to auscultation, no wheezes or rales and unlabored breathing Abdomen: Postsurgical changes noted in the abdomen. Mild stage I sacral decub ulcer.  Extremities: extremities normal, atraumatic, no cyanosis or edema mild bilateral popliteal tenderness to palpation. Skin:no rashes, no ecchymoses Neurology: Generalized tremor in the upper extremities, minimally cooperative to exam. No focal deficits noted  Labs and Imaging: Lab Results  Component Value  Date/Time   NA 145 07/30/2013  2:44 AM   K 5.1 07/30/2013  2:44 AM   CL 109 07/30/2013  2:44 AM   CO2 29 07/30/2013  2:44 AM   BUN 6 07/30/2013  2:44 AM   CREATININE 0.40* 07/30/2013  2:44 AM   GLUCOSE 98 07/30/2013  2:44 AM   Lab Results  Component Value Date   WBC 8.1 07/28/2013   HGB 9.8* 07/28/2013   HCT 29.6* 07/28/2013   MCV 97.4 07/28/2013   PLT 143* 07/28/2013    No results found.   Assessment and Plan: Shaia Porath is a 53 y.o. year old female presenting with displaced jejunostomy feeding tube.   Feeding Tube: Per report, interventional radiology is aware of case. Plan per J-tube replacement in the morning. Will otherwise convert chronic medications to IV equivalents. Case was discussed with on-call pharmacy. Will make patient n.p.o. in the interim. D5 half-normal saline at maintenance rate in the interim.  Seizure disorder: We'll convert to call some IV Keppra to IV equivalents.  HTN: convert metoprolol to IV dosing 1:2.5.   Psychosis: IV haldol. Clinically stable currently.   Sacral Decub ulcer: Stage 1 clinically. Wound care consult.   FEN/GI: NPO. As above. Reolace flexiseal prn.   Prophylaxis: lovenox. Noted bilateral popliteal tenderness. Check LE u/s to r/o DVT. Wells Score 2+  Disposition: pending further evaluation  Code Status:Full Code        Doree Albee MD  Pager: [phone]

## 2013-08-23 NOTE — Progress Notes (Signed)
  Pt admitted to the unit. Pt is stable, alert and oriented per baseline. Oriented to room, staff, and call bell. Educated to call for any assistance. Bed in lowest position, call bell within reach- will continue to monitor. 

## 2013-08-23 NOTE — ED Provider Notes (Signed)
CSN: [id number]     Arrival date & time 08/23/13  1612 History   First MD Initiated Contact with Patient 08/23/13 1627     Chief Complaint  Patient presents with  . Peg tube placement       HPI  Patient presents from skilled nursing facility with her visual feeding tube out. Complex recent medical history including toxic metabolic encephalopathy. She is n.p.o. Fed and given medications and hydration exclusively to the jejunal feeding tube he was out. There were multiple temperature place at the skilled nursing facilities were unsuccessful and she was transferred here.  Past Medical History  Diagnosis Date  . Peripheral neuropathy   . Fibromyalgia   . Hypertension   . Anxiety   . Catatonia   . Seizures    Past Surgical History  Procedure Laterality Date  . Gastric bypass    . Abdominal hysterectomy    . Jejunostomy N/A 06/25/2013    Procedure:  OPEN JEJUNOSTOMY FEEDING TUBE ;  Surgeon: Cherylynn Ridges, MD;  Location: [facility] OR;  Service: General;  Laterality: N/A;  . Laparotomy N/A 07/02/2013    Procedure: EXPLORATORY LAPAROTOMY with revision of jejunostomy feeding tube.;  Surgeon: Cherylynn Ridges, MD;  Location: [facility] OR;  Service: General;  Laterality: N/A;  . Bowel resection N/A 07/02/2013    Procedure: SMALL BOWEL RESECTION;  Surgeon: Cherylynn Ridges, MD;  Location: [facility] OR;  Service: General;  Laterality: N/A;   Family History  Problem Relation Age of Onset  . Hypertension Mother   . Diabetes Father    History  Substance Use Topics  . Smoking status: Never Smoker   . Smokeless tobacco: Not on file  . Alcohol Use: No   OB History   Grav Para Term Preterm Abortions TAB SAB Ect Mult Living                 Review of Systems  Unable to perform ROS: Mental status change      Allergies  Morphine and related; Paroxetine hcl; Penicillins; and Sertraline hcl  Home Medications   Current Outpatient Rx  Name  Route  Sig  Dispense  Refill  . acetaminophen (TYLENOL) 325 MG tablet  Per J Tube   2 tablets (650 mg total) by Per J Tube route every 6 (six) hours as needed for mild pain (or Fever >/= 101).         . cholestyramine (QUESTRAN) 4 G packet   Oral   Take 4 g by mouth every 8 (eight) hours.         . clonazePAM (KLONOPIN) 0.5 MG tablet   Oral   Take 0.5 mg by mouth every 8 (eight) hours as needed for anxiety.         . enoxaparin (LOVENOX) 40 MG/0.4ML injection   Subcutaneous   Inject 40 mg into the skin daily.         [name] HYDROcodone-acetaminophen (NORCO/VICODIN) 5-325 MG per tablet   Oral   Take 1 tablet by mouth every 6 (six) hours as needed for moderate pain.         [name] lacosamide (VIMPAT) 200 MG TABS tablet   Per Tube   Place 200 mg into feeding tube 2 (two) times daily.         [name] levETIRAcetam (KEPPRA) 500 MG tablet   Per Tube   Place 1,500 mg into feeding tube 2 (two) times daily.         . metoprolol (LOPRESSOR) 25  MG tablet   Per J Tube   3 tablets (75 mg total) by Per J Tube route 2 (two) times daily.         . mirtazapine (REMERON SOL-TAB) 15 MG disintegrating tablet   Per J Tube   1 tablet (15 mg total) by Per J Tube route at bedtime.         . Nutritional Supplements (FEEDING SUPPLEMENT, VITAL AF 1.2 CAL,) LIQD   Per Tube   Place 1,000 mLs into feeding tube continuous.         [name] oxycodone (OXY-IR) 5 MG capsule   Oral   Take 5 mg by mouth every 8 (eight) hours as needed for pain.         . pantoprazole sodium (PROTONIX) 40 mg/20 mL PACK   Per Tube   Place 20 mLs (40 mg total) into feeding tube daily at 12 noon.   30 each      . risperiDONE (RISPERDAL M-TABS) 0.5 MG disintegrating tablet   Oral   Take 1 tablet (0.5 mg total) by mouth 2 (two) times daily.         . Valproic Acid (DEPAKENE) 250 MG/5ML SYRP syrup   Per Tube   Place 15 mLs (750 mg total) into feeding tube every 8 (eight) hours.   600 mL      . Water For Irrigation, Sterile (FREE WATER) SOLN   Per Tube   Place 200 mLs into feeding  tube every 6 (six) hours.          BP 142/92  Pulse 107  Temp(Src) 97.7 F (36.5 C) (Oral)  Resp 16  SpO2 100% Physical Exam  Constitutional: She is oriented to person, place, and time. She appears well-developed and well-nourished. No distress.  Calm. She can answer simple questions.  HENT:  Head: Normocephalic.  Eyes: Conjunctivae are normal. Pupils are equal, round, and reactive to light. No scleral icterus.  Neck: Normal range of motion. Neck supple. No thyromegaly present.  Cardiovascular: Normal rate and regular rhythm.  Exam reveals no gallop and no friction rub.   No murmur heard. Pulmonary/Chest: Effort normal and breath sounds normal. No respiratory distress. She has no wheezes. She has no rales.  Abdominal: Soft. Bowel sounds are normal. She exhibits no distension. There is no tenderness. There is no rebound.    Musculoskeletal: Normal range of motion.  Neurological: She is alert and oriented to person, place, and time.  Skin: Skin is warm and dry. No rash noted.  Psychiatric: She has a normal mood and affect. Her behavior is normal.    ED Course  Procedures (including critical care time) Labs Review Labs Reviewed  COMPREHENSIVE METABOLIC PANEL - Abnormal; Notable for the following:    Creatinine, Ser 0.33 (*)    Albumin 3.1 (*)    All other components within normal limits  CBC WITH DIFFERENTIAL  VALPROIC ACID LEVEL  COMPREHENSIVE METABOLIC PANEL  CBC WITH DIFFERENTIAL   Imaging Review No results found.   EKG Interpretation None      MDM   Final diagnoses:  PEG tube malfunction    After multiple attempts was unable to pass multiple sizes of Foley catheters. There is a subcutaneous track had it was present with the first attempt. She has had multiple attempts at replacement of her skilled nursing facility. With ultrasound is able to visualize these passing a subcutaneous tract. These further attempts were stopped. I discussed the case with Dr.  Janee Morn general surgery,  and with Dr.Yamagata of IR, and Dr. Alvester MorinNewton of [facility]. Patient is n.p.o. All of her hydration, sustenance, and medications are through her jejunal feeding tube. Should be admitted. She undergo attempt at wire placement and dilatation and passage of jejunal feeding tube in invasive radiology tomorrow morning    Rolland PorterMark Zacarias Krauter, MD 08/23/13 [phone]

## 2013-08-23 NOTE — ED Notes (Signed)
Dr. Fayrene FearingJames attempted placing 14 fr and 10 fr catheters with US without success. Pt in NAD

## 2013-08-23 NOTE — ED Notes (Signed)
[facility] Patient had pediatric peg tube in place (size unknown). Tube was pulled out, Facility tried to replace tube with another pediatric tube and were unsuccessful. Facility requests adult size peg tube insertion.

## 2013-08-23 NOTE — ED Notes (Signed)
Attempted to call [facility] to x3 without success in reaching RN on unit.

## 2013-08-23 NOTE — ED Notes (Signed)
Patient sleeping

## 2013-08-23 NOTE — Progress Notes (Signed)
Patient requested all four side rails be up on her bed.  Patient's primary nurse notified.  Will continue to monitor patient.

## 2013-08-23 NOTE — ED Notes (Signed)
MD made aware of patients pain and potential for IV access. sts will talk to general surgery and update RN

## 2013-08-24 ENCOUNTER — Observation Stay (HOSPITAL_COMMUNITY): Payer: BC Managed Care – PPO

## 2013-08-24 DIAGNOSIS — D649 Anemia, unspecified: Secondary | ICD-10-CM

## 2013-08-24 DIAGNOSIS — M79609 Pain in unspecified limb: Secondary | ICD-10-CM

## 2013-08-24 DIAGNOSIS — K9423 Gastrostomy malfunction: Secondary | ICD-10-CM

## 2013-08-24 DIAGNOSIS — N179 Acute kidney failure, unspecified: Secondary | ICD-10-CM

## 2013-08-24 DIAGNOSIS — G9340 Encephalopathy, unspecified: Secondary | ICD-10-CM

## 2013-08-24 DIAGNOSIS — M7989 Other specified soft tissue disorders: Secondary | ICD-10-CM

## 2013-08-24 LAB — CBC WITH DIFFERENTIAL/PLATELET
BASOS PCT: 0 % (ref 0–1)
Basophils Absolute: 0 10*3/uL (ref 0.0–0.1)
EOS ABS: 0 10*3/uL (ref 0.0–0.7)
EOS PCT: 0 % (ref 0–5)
HCT: 35.6 % — ABNORMAL LOW (ref 36.0–46.0)
Hemoglobin: 12.1 g/dL (ref 12.0–15.0)
LYMPHS ABS: 1.2 10*3/uL (ref 0.7–4.0)
Lymphocytes Relative: 17 % (ref 12–46)
MCH: 33.1 pg (ref 26.0–34.0)
MCHC: 34 g/dL (ref 30.0–36.0)
MCV: 97.3 fL (ref 78.0–100.0)
Monocytes Absolute: 1.2 10*3/uL — ABNORMAL HIGH (ref 0.1–1.0)
Monocytes Relative: 17 % — ABNORMAL HIGH (ref 3–12)
Neutro Abs: 4.8 10*3/uL (ref 1.7–7.7)
Neutrophils Relative %: 66 % (ref 43–77)
PLATELETS: 130 10*3/uL — AB (ref 150–400)
RBC: 3.66 MIL/uL — ABNORMAL LOW (ref 3.87–5.11)
RDW: 14.4 % (ref 11.5–15.5)
WBC: 7.2 10*3/uL (ref 4.0–10.5)

## 2013-08-24 LAB — COMPREHENSIVE METABOLIC PANEL
ALBUMIN: 2.6 g/dL — AB (ref 3.5–5.2)
ALK PHOS: 53 U/L (ref 39–117)
ALT: 15 U/L (ref 0–35)
AST: 22 U/L (ref 0–37)
BILIRUBIN TOTAL: 0.3 mg/dL (ref 0.3–1.2)
BUN: 13 mg/dL (ref 6–23)
CHLORIDE: 99 meq/L (ref 96–112)
CO2: 27 mEq/L (ref 19–32)
CREATININE: 0.34 mg/dL — AB (ref 0.50–1.10)
Calcium: 9.2 mg/dL (ref 8.4–10.5)
GFR calc Af Amer: >90 mL/min (ref >90)
GFR calc non Af Amer: >90 mL/min (ref >90)
Glucose, Bld: 86 mg/dL (ref 70–99)
POTASSIUM: 3.7 meq/L (ref 3.7–5.3)
SODIUM: 140 meq/L (ref 137–147)
Total Protein: 5.7 g/dL — ABNORMAL LOW (ref 6.0–8.3)

## 2013-08-24 MED ORDER — IOHEXOL 300 MG/ML  SOLN
50.0000 mL | Freq: Once | INTRAMUSCULAR | Status: AC | PRN
  Administered 2013-08-24: 20 mL

## 2013-08-24 MED ORDER — FLUCONAZOLE IN SODIUM CHLORIDE 200-0.9 MG/100ML-% IV SOLN
200.0000 mg | INTRAVENOUS | Status: DC
  Administered 2013-08-24 – 2013-08-26 (×3): 200 mg via INTRAVENOUS
  Filled 2013-08-24 (×4): qty 100

## 2013-08-24 NOTE — Progress Notes (Signed)
On-call provider notified that PICC line is not viable per IV team and needs to be discontinued due to risk of infection. Will continue to monitor.

## 2013-08-24 NOTE — Progress Notes (Signed)
Patient needs chest xray to confirm placement for her PICC line that she came with. Patient also has oral thrush and will need something after the surgery. Note has been placed for the MD.

## 2013-08-24 NOTE — Progress Notes (Signed)
Utilization review completed.  P.J. Rami Budhu,RN,BSN Case Manager 

## 2013-08-24 NOTE — Consult Note (Signed)
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 Wound type: MASD, specifically, IAD Pressure Ulcer POA: No Measurement: 3cm x 0.5cm x 0.2cm Wound bed: clean, pink, moist Drainage (amount, consistency, odor) Scant serous Periwound:intact, macerated.  Patient is wet with double incontinence (fecal and urinary) Dressing procedure/placement/frequency: I will forego a topical dressing in the presence of double incontinence in favor of timely incontinence care using our house perineal cleanser and topical protective barrier cream which is zinc-based.  Turning and repositioning off of the supine position is critical and is recommended every 2 hours and PRN for incontinence care. WOC nursing team will not follow, but will remain available to this patient, the nursing and medical team.  Please re-consult if needed. Thanks, Ladona MowLaurie Benney [place], MSN, RN, GNP, [place], CWON-AP [phone])

## 2013-08-24 NOTE — Progress Notes (Signed)
[facility] PROGRESS NOTE  Joan PeelingLora [name] JYN:[id number] DOB: 07/31/1960 DOA: 08/23/2013 PCP: Karlene EinsteinASANAYAKA,[name], MD  Brief narrative: 53 year old female with multiple medical problems including seizure, chronic encephalopathy, small bowel resection with jejunostomy feeding tube presenting with displaced feeding tube from SNF. IR consulted for J tube replacement.   Assessment/Plan:  Active Problems:   Displaced feeding tube - per IR, able to re cannulate,  3/28; may use feeding tube   Thrush - will use fluconazole 200 mg IV daily   Hypertension - metoprolol 5 mg IV every 6 hours   H/O seizure disorder - may continue Keppra 1500 mg every 12 hours, Vimpat 200 mg IV every 12 hours, valproate 750 mg IV Q 8 hours   Functional quadriplegia - due to multiple medical co-morbidities - will return to SNF when stable, liekey in am   Code Status: full code Family Communication: no family at the bedside Disposition Plan: remains inpatient  Manson PasseyEVINE, [name], MD  [facility] Pager [phone]  If 7PM-7AM, please contact night-coverage [email or web address] Password [email or web address] 08/24/2013, 6:07 PM   LOS: 1 day   Consultants:  IR   Procedures:  J tube replacement 3/28  Antibiotics:  None   HPI/Subjective: No acute overnight events.   Objective: Filed Vitals:   08/24/13 0234 08/24/13 0536 08/24/13 0600 08/24/13 1324  BP:  112/70  124/79  Pulse:  125 104 112  Temp:  99.7 F (37.6 C)  98.7 F (37.1 C)  TempSrc:  Oral  Oral  Resp:  18  18  Height:      Weight: 67.3 kg (148 lb 5.9 oz)     SpO2:  99%  100%    Intake/Output Summary (Last 24 hours) at 08/24/13 1807 Last data filed at 08/24/13 0815  Gross per 24 hour  Intake 1228.75 ml  Output      0 ml  Net 1228.75 ml    Exam:   General:  Pt is sleeping, no acute distress  Cardiovascular: Regular rate and rhythm, S1/S2 apprecaited  Respiratory: Clear to auscultation bilaterally, no wheezing  Abdomen: Soft, non tender, J  tube  Extremities: pulses DP and PT palpable bilaterally  Neuro: Grossly nonfocal  Data Reviewed: Basic Metabolic Panel:  Recent Labs Lab 08/23/13 1836 08/24/13 0550  NA 139 140  K 4.6 3.7  CL 97 99  CO2 29 27  GLUCOSE 83 86  BUN 15 13  CREATININE 0.33* 0.34*  CALCIUM 9.7 9.2   Liver Function Tests:  Recent Labs Lab 08/23/13 1836 08/24/13 0550  AST 31 22  ALT 18 15  ALKPHOS 59 53  BILITOT 0.3 0.3  PROT 6.6 5.7*  ALBUMIN 3.1* 2.6*   No results found for this basename: LIPASE, AMYLASE,  in the last 168 hours No results found for this basename: AMMONIA,  in the last 168 hours CBC:  Recent Labs Lab 08/23/13 1836 08/24/13 0550  WBC 7.1 7.2  NEUTROABS 4.3 4.8  HGB 13.2 12.1  HCT 38.6 35.6*  MCV 97.5 97.3  PLT 160 130*   Cardiac Enzymes: No results found for this basename: CKTOTAL, CKMB, CKMBINDEX, TROPONINI,  in the last 168 hours BNP: No components found with this basename: POCBNP,  CBG: No results found for this basename: GLUCAP,  in the last 168 hours  MRSA PCR SCREENING     Status: None   Collection Time    08/23/13  8:32 PM      Result Value Ref Range Status   MRSA by PCR NEGATIVE  NEGATIVE Final     Studies: Ir Replc Duoden/jejuno Tube Percut W/fluoro 08/24/2013     IMPRESSION: Replacement of jejunostomy tube with ability to catheterize the old tube tract. A 10 French red rubber jejunostomy catheter was advanced into the jejunum.   Electronically Signed   By: Irish Lack M.D.   On: 08/24/2013 13:16    Scheduled Meds: . enoxaparin  40 mg Subcutaneous Q24H  . fluconazole (DIFLUCAN)  200 mg Intravenous Q24H  . haloperidol lactate  1 mg Intravenous Q12H  . lacosamide (VIMPAT) IV  200 mg Intravenous Q12H  . levETIRAcetam  1,500 mg Intravenous Q12H  . metoprolol  5 mg Intravenous 4 times per day  . pantoprazole  40 mg Intravenous Q12H  . valproate sodium  750 mg Intravenous 3 times per day   Continuous Infusions: . dextrose 5 % and 0.45%  NaCl 75 mL/hr at 08/24/13 1233

## 2013-08-24 NOTE — Progress Notes (Addendum)
Order placed for central line removal.

## 2013-08-24 NOTE — Progress Notes (Signed)
VASCULAR LAB PRELIMINARY  PRELIMINARY  PRELIMINARY  PRELIMINARY  Bilateral lower extremity venous Dopplers completed.    Preliminary report:  There is no DVT or SVT noted in the bilateral lower extremities.  Shandie Bertz, RVT 08/24/2013, 10:42 AM

## 2013-08-24 NOTE — Procedures (Signed)
Procedure:  Jejunostomy tube replacement Findings:  Able to recannulate J-tube tract under fluoro with catheter and wire.  Unable to advance 12 Fr tube through tract.  New 10 Fr red rubber catheter advanced into jejunum.  OK to use new tube.

## 2013-08-24 NOTE — Progress Notes (Signed)
IV team has been notified about patient's central line.

## 2013-08-25 DIAGNOSIS — R569 Unspecified convulsions: Secondary | ICD-10-CM

## 2013-08-25 LAB — COMPREHENSIVE METABOLIC PANEL
ALT: 12 U/L (ref 0–35)
AST: 22 U/L (ref 0–37)
Albumin: 2.6 g/dL — ABNORMAL LOW (ref 3.5–5.2)
Alkaline Phosphatase: 51 U/L (ref 39–117)
BUN: 9 mg/dL (ref 6–23)
CO2: 28 meq/L (ref 19–32)
CREATININE: 0.38 mg/dL — AB (ref 0.50–1.10)
Calcium: 9.1 mg/dL (ref 8.4–10.5)
Chloride: 98 mEq/L (ref 96–112)
GFR calc Af Amer: >90 mL/min (ref >90)
Glucose, Bld: 83 mg/dL (ref 70–99)
Potassium: 3.7 mEq/L (ref 3.7–5.3)
Sodium: 138 mEq/L (ref 137–147)
Total Bilirubin: 0.3 mg/dL (ref 0.3–1.2)
Total Protein: 5.9 g/dL — ABNORMAL LOW (ref 6.0–8.3)

## 2013-08-25 LAB — CBC WITH DIFFERENTIAL/PLATELET
Basophils Absolute: 0 10*3/uL (ref 0.0–0.1)
Basophils Relative: 0 % (ref 0–1)
Eosinophils Absolute: 0.1 10*3/uL (ref 0.0–0.7)
Eosinophils Relative: 2 % (ref 0–5)
HCT: 34.6 % — ABNORMAL LOW (ref 36.0–46.0)
Hemoglobin: 11.7 g/dL — ABNORMAL LOW (ref 12.0–15.0)
LYMPHS ABS: 1.7 10*3/uL (ref 0.7–4.0)
Lymphocytes Relative: 27 % (ref 12–46)
MCH: 33.6 pg (ref 26.0–34.0)
MCHC: 33.8 g/dL (ref 30.0–36.0)
MCV: 99.4 fL (ref 78.0–100.0)
Monocytes Absolute: 0.8 10*3/uL (ref 0.1–1.0)
Monocytes Relative: 12 % (ref 3–12)
Neutro Abs: 3.7 10*3/uL (ref 1.7–7.7)
Neutrophils Relative %: 59 % (ref 43–77)
Platelets: 138 10*3/uL — ABNORMAL LOW (ref 150–400)
RBC: 3.48 MIL/uL — AB (ref 3.87–5.11)
RDW: 14.4 % (ref 11.5–15.5)
WBC: 6.3 10*3/uL (ref 4.0–10.5)

## 2013-08-25 LAB — GLUCOSE, CAPILLARY
GLUCOSE-CAPILLARY: 82 mg/dL (ref 70–99)
Glucose-Capillary: 74 mg/dL (ref 70–99)

## 2013-08-25 MED ORDER — VITAL AF 1.2 CAL PO LIQD
1000.0000 mL | ORAL | Status: DC
  Administered 2013-08-25 – 2013-08-26 (×2): 1000 mL
  Filled 2013-08-25 (×3): qty 1000

## 2013-08-25 MED ORDER — VITAL AF 1.2 CAL PO LIQD: 1000.0000 mL | ORAL | Status: DC

## 2013-08-25 MED ORDER — SODIUM CHLORIDE 0.9 % IV SOLN
INTRAVENOUS | Status: DC
  Administered 2013-08-25: 14:00:00 via INTRAVENOUS

## 2013-08-25 NOTE — Progress Notes (Signed)
[facility] PROGRESS NOTE  Jimmi Sidener WUJ:[id number] DOB: Apr 10, 1961 DOA: 08/23/2013 PCP: [name], MD  Brief narrative: 53 year old female with multiple medical problems including seizure, chronic encephalopathy, small bowel resection with jejunostomy feeding tube presenting with displaced feeding tube from SNF. IR consulted for J tube replacement, they were able to re-cannulate the tube. [facility] remains where the patient will be discharged. She apparently was not happy in [facility] as she felt neglected there. WIll ask SW for assistance.    Assessment/Plan:   Active Problems:  Displaced feeding tube  - per IR, able to re cannulate, 3/28; may use feeding tube  - nutrition consulted  Thrush  - continue  fluconazole 200 mg IV daily  Hypertension  - metoprolol 5 mg IV every 6 hours  - BP 127/82 H/O seizure disorder  - may continue Keppra 1500 mg every 12 hours, Vimpat 200 mg IV every 12 hours, valproate 750 mg IV Q 8 hours  Functional quadriplegia  - due to multiple medical co-morbidities  - will return to LTAC or SNF Thrombocytopenia - platelets 138 this am; on admission platelet count was 160 - will use SCD's for DVT prophylaxis  Code Status: full code  Family Communication: no family at the bedside  Disposition Plan: remains inpatient   Consultants:  IR  Procedures:  J tube replacement 3/28 Antibiotics:  None   Manson Passey, MD  [facility] Pager [phone]  If 7PM-7AM, please contact night-coverage [email or web address] Password [facility] 08/25/2013, 12:48 PM   LOS: 2 days    HPI/Subjective: No acute overnight events.   Objective: Filed Vitals:   08/24/13 1324 08/24/13 2044 08/25/13 0636 08/25/13 0800  BP: 124/79 138/64 127/82   Pulse: 112 108 92   Temp: 98.7 F (37.1 C) 98.3 F (36.8 C) 98.1 F (36.7 C)   TempSrc: Oral Oral Oral   Resp: 18 18 18    Height:      Weight:    70.308 kg (155 lb)  SpO2: 100% 100% 100%     Intake/Output Summary  (Last 24 hours) at 08/25/13 1248 Last data filed at 08/25/13 0800  Gross per 24 hour  Intake 2373.75 ml  Output      0 ml  Net 2373.75 ml    Exam:   General:  Pt is sleeping this am  Cardiovascular: Regular rate and rhythm, S1/S2 appreciated   Respiratory: Clear to auscultation bilaterally, no wheezing  Abdomen: j tube in place, no abdominal tenderness  Extremities: Pulses DP and PT palpable bilaterally  Neuro: Grossly nonfocal  Data Reviewed: Basic Metabolic Panel:  Recent Labs Lab 08/23/13 1836 08/24/13 0550 08/25/13 0657  NA 139 140 138  K 4.6 3.7 3.7  CL 97 99 98  CO2 29 27 28   GLUCOSE 83 86 83  BUN 15 13 9   CREATININE 0.33* 0.34* 0.38*  CALCIUM 9.7 9.2 9.1   Liver Function Tests:  Recent Labs Lab 08/23/13 1836 08/24/13 0550 08/25/13 0657  AST 31 22 22   ALT 18 15 12   ALKPHOS 59 53 51  BILITOT 0.3 0.3 0.3  PROT 6.6 5.7* 5.9*  ALBUMIN 3.1* 2.6* 2.6*   No results found for this basename: LIPASE, AMYLASE,  in the last 168 hours No results found for this basename: AMMONIA,  in the last 168 hours CBC:  Recent Labs Lab 08/23/13 1836 08/24/13 0550 08/25/13 0657  WBC 7.1 7.2 6.3  NEUTROABS 4.3 4.8 3.7  HGB 13.2 12.1 11.7*  HCT 38.6 35.6* 34.6*  MCV 97.5 97.3  99.4  PLT 160 130* 138*   Cardiac Enzymes: No results found for this basename: CKTOTAL, CKMB, CKMBINDEX, TROPONINI,  in the last 168 hours BNP: No components found with this basename: POCBNP,  CBG: No results found for this basename: GLUCAP,  in the last 168 hours  MRSA PCR SCREENING     Status: None   Collection Time    08/23/13  8:32 PM      Result Value Ref Range Status   MRSA by PCR NEGATIVE  NEGATIVE Final     Studies: Ir Replc Duoden/jejuno Tube Percut W/fluoro 08/24/2013   IMPRESSION: Replacement of jejunostomy tube with ability to catheterize the old tube tract. A 10 French red rubber jejunostomy catheter was advanced into the jejunum.      Scheduled Meds: . fluconazole  (DIFLUCAN)   200 mg Intravenous Q24H  . haloperidol lactate  1 mg Intravenous Q12H  . lacosamide (VIMPAT) IV  200 mg Intravenous Q12H  . levETIRAcetam  1,500 mg Intravenous Q12H  . metoprolol  5 mg Intravenous 4 times per day  . protonix  40 mg Intravenous Q12H  . valproate sodium  750 mg Intravenous 3 times per day   Continuous Infusions: . dextrose 5 % and 0.45% NaCl 75 mL/hr at 08/24/13 1233

## 2013-08-25 NOTE — Progress Notes (Signed)
Utilization review completed.  P.J. Curtina Grills,RN,BSN Case Manager 

## 2013-08-25 NOTE — Clinical Social Work Psychosocial (Signed)
Clinical Social Work Department BRIEF PSYCHOSOCIAL ASSESSMENT 08/25/2013  Patient:  Joan [name], Joan [name]     Account Number:  [id number]     Admit date:  08/23/2013  Clinical Social Worker:  Lovey Newcomer  Date/Time:  08/25/2013 01:51 PM  Referred by:  Physician  Date Referred:  08/25/2013 Referred for  SNF Placement   Other Referral:   Interview type:  Patient Other interview type:   Patient and patient's son Joan [name] were interviewed.    PSYCHOSOCIAL DATA Living Status:  FAMILY Admitted from facility:  OTHER Level of care:  Nellie Primary support name:  Joan [name] Primary support relationship to patient:  CHILD, ADULT Degree of support available:   Support is good. Patient lives with son Joan [name] but has been in and out of hospital for several months.    CURRENT CONCERNS Current Concerns  Post-Acute Placement   Other Concerns:    SOCIAL WORK ASSESSMENT / PLAN CSW met with patient at bedside to complete assessment with patient's son on phone. Patient confirms that she has been at [facility] and originally stated that she didn't want to go back because "they didn't treat me right." CSW inquired about what she meant by this but she was unable to give a specific example. Patient asked if she could speak with her son Joan [name] on speaker phone. Patient and Joan [name] spoke at length about how patient is doing. Patient had many questions about family and seemed to be relearning a lot of informaiton from [place]. Patient had forgotten that her son [name] moved to [place], and that she was at [facility] prior to going to [place]. By end of assessment, patient stated that she wanted to return to [facility] SNF because her son has worked really hard to get her into this facility. CSW explained the DC process to patient and answered all of her questions.   Assessment/plan status:  Psychosocial Support/Ongoing Assessment of Needs Other assessment/ plan:    Complete FL2, Fax, PASRR   Information/referral to community resources:   CSW contact information and SNF list given to patient.    PATIENT'S/FAMILY'S RESPONSE TO PLAN OF CARE: Patient and patient's son plan for patient to return to [facility] SNF at discharge. Patient had flat affect and her hands shook constantly. Patient is agreeable to return to SNF at discharge. CSW will assist with DC when appropriate.       [name], [place], [place], [phone]

## 2013-08-25 NOTE — Progress Notes (Signed)
Brief Nutrition Note  Consult received for enteral/tube feeding initiation and management.  Called by RN who reported patient now approved to use new J-tube.  Patient was on TF at Nursing Facility (Vital AF 1.2 at 60 ml/hr).  Started this currently.  Adult Enteral Nutrition Protocol initiated. Full assessment to follow.  Admitting Dx: PEG tube malfunction [536.42]  Body mass index is 27.46 kg/(m^2). Pt meets criteria for overweight based on current BMI.  Labs:   Recent Labs Lab 08/23/13 1836 08/24/13 0550 08/25/13 0657  NA 139 140 138  K 4.6 3.7 3.7  CL 97 99 98  CO2 29 27 28   BUN 15 13 9   CREATININE 0.33* 0.34* 0.38*  CALCIUM 9.7 9.2 9.1  GLUCOSE 83 86 83    Joan [name], RD, LDN Clinical Inpatient Dietitian Pager:  [phone] Weekend and after hours pager:  [phone]

## 2013-08-25 NOTE — Progress Notes (Signed)
Feeding rate adjusted in error. Rate changed back to 330mL/hr, no residual noted. Patient in no distress. On-call provider notified. Will continue to monitor.

## 2013-08-26 LAB — GLUCOSE, CAPILLARY
GLUCOSE-CAPILLARY: 82 mg/dL (ref 70–99)
GLUCOSE-CAPILLARY: 89 mg/dL (ref 70–99)
Glucose-Capillary: 81 mg/dL (ref 70–99)
Glucose-Capillary: 85 mg/dL (ref 70–99)
Glucose-Capillary: 89 mg/dL (ref 70–99)

## 2013-08-26 LAB — CBC WITH DIFFERENTIAL/PLATELET
BASOS ABS: 0 10*3/uL (ref 0.0–0.1)
Basophils Relative: 0 % (ref 0–1)
EOS ABS: 0.1 10*3/uL (ref 0.0–0.7)
Eosinophils Relative: 2 % (ref 0–5)
HCT: 34.7 % — ABNORMAL LOW (ref 36.0–46.0)
Hemoglobin: 12 g/dL (ref 12.0–15.0)
LYMPHS ABS: 1.6 10*3/uL (ref 0.7–4.0)
LYMPHS PCT: 23 % (ref 12–46)
MCH: 33.5 pg (ref 26.0–34.0)
MCHC: 34.6 g/dL (ref 30.0–36.0)
MCV: 96.9 fL (ref 78.0–100.0)
Monocytes Absolute: 0.8 10*3/uL (ref 0.1–1.0)
Monocytes Relative: 12 % (ref 3–12)
Neutro Abs: 4.5 10*3/uL (ref 1.7–7.7)
Neutrophils Relative %: 64 % (ref 43–77)
PLATELETS: 133 10*3/uL — AB (ref 150–400)
RBC: 3.58 MIL/uL — AB (ref 3.87–5.11)
RDW: 13.9 % (ref 11.5–15.5)
WBC: 7 10*3/uL (ref 4.0–10.5)

## 2013-08-26 MED ORDER — FREE WATER
200.0000 mL | Freq: Four times a day (QID) | Status: DC
  Administered 2013-08-26 – 2013-08-27 (×5): 200 mL

## 2013-08-26 NOTE — Progress Notes (Signed)
INITIAL NUTRITION ASSESSMENT  DOCUMENTATION CODES Per approved criteria  -Not Applicable   INTERVENTION: Continue to advance Vital AF 1.2 at 60 ml/hr - goal provides 1728 kcal, 108 grams protein, 1168 ml free water. Resume free water flushes of 200 ml QID. RD to continue to follow nutrition care plan.  NUTRITION DIAGNOSIS: Inadequate oral intake related to inability to eat as evidenced by NPO status.   Goal: Intake to meet >90% of estimated nutrition needs.  Monitor:  weight trends, lab trends, I/O's, TF tolerance  Reason for Assessment: MD Consult for TF Initiation and Management  53 y.o. female  Admitting Dx: displaced feeding tube  ASSESSMENT: PMHx significant for seizure, chronic encephalopathy, small bowel resection with jejunostomy feeding tube. Pt with chronic encephalopathy. Admitted after j-tube fell out. Pt is from [facility].  J-tube replaced on 3/28 by IR.  WOC RN saw pt on 3/28 - pt with MASD, specifically incontinence associated dermatitis at apex of gluteal cleft.  Currently receiving Vital AF 1.2 at 50 ml/hr via j-tube. Per RN, pt is tolerating well. Plan to advance to goal rate of 60 ml/hr at 1100 am this morning.  Nutrition Focused Physical Exam:   Subcutaneous Fat:  Orbital Region: wnl  Upper Arm Region: wnl Thoracic and Lumbar Region: NA   Muscle:  Temple Region: mild-moderate wasting Clavicle Bone Region: wnl  Clavicle and Acromion Bone Region: wnl  Scapular Bone Region: NA  Dorsal Hand: wnl Patellar Region: wnl  Anterior Thigh Region: wnl  Posterior Calf Region: wnl   Edema: none   Height: Ht Readings from Last 1 Encounters:  08/23/13 5\' 3"  (1.6 m)    Weight: Wt Readings from Last 1 Encounters:  08/26/13 154 lb 9.6 oz (70.126 kg)    Ideal Body Weight: 115 lb  % Ideal Body Weight: 134%  Wt Readings from Last 10 Encounters:  08/26/13 154 lb 9.6 oz (70.126 kg)  07/30/13 181 lb 14.1 oz (82.5 kg)  07/24/13 170 lb 11.2 oz  (77.429 kg)  07/16/13 182 lb 12.2 oz (82.9 kg)  07/16/13 182 lb 12.2 oz (82.9 kg)  07/16/13 182 lb 12.2 oz (82.9 kg)  05/17/13 154 lb 12.2 oz (70.2 kg)    Usual Body Weight: n/a  % Usual Body Weight: n/a  BMI:  Body mass index is 27.39 kg/(m^2). Overweight  Estimated Nutritional Needs: Kcal: 1700 - 1900 Protein: 85 - 100 g Fluid: 1.8 - 2 liters  Skin:  Stage II medial sacrum Stage II R thigh  Diet Order: NPO  EDUCATION NEEDS: -No education needs identified at this time   Intake/Output Summary (Last 24 hours) at 08/26/13 0943 Last data filed at 08/26/13 1610  Gross per 24 hour  Intake 965.83 ml  Output      0 ml  Net 965.83 ml    Last BM: 3/29  Labs:   Recent Labs Lab 08/23/13 1836 08/24/13 0550 08/25/13 0657  NA 139 140 138  K 4.6 3.7 3.7  CL 97 99 98  CO2 29 27 28   BUN 15 13 9   CREATININE 0.33* 0.34* 0.38*  CALCIUM 9.7 9.2 9.1  GLUCOSE 83 86 83    CBG (last 3)   Recent Labs  08/25/13 2020 08/26/13 0020 08/26/13 0448  GLUCAP 82 82 81    Scheduled Meds: . fluconazole (DIFLUCAN) IV  200 mg Intravenous Q24H  . haloperidol lactate  1 mg Intravenous Q12H  . lacosamide (VIMPAT) IV  200 mg Intravenous Q12H  . levETIRAcetam  1,500 mg  Intravenous Q12H  . metoprolol  5 mg Intravenous 4 times per day  . pantoprazole (PROTONIX) IV  40 mg Intravenous Q12H  . valproate sodium  750 mg Intravenous 3 times per day    Continuous Infusions: . sodium chloride 20 mL/hr at 08/25/13 1348  . feeding supplement (VITAL AF 1.2 CAL) 1,000 mL (08/26/13 0703)    Past Medical History  Diagnosis Date  . Peripheral neuropathy   . Fibromyalgia   . Hypertension   . Anxiety   . Catatonia   . Seizures     Past Surgical History  Procedure Laterality Date  . Gastric bypass    . Abdominal hysterectomy    . Jejunostomy N/A 06/25/2013    Procedure:  OPEN JEJUNOSTOMY FEEDING TUBE ;  Surgeon: Cherylynn RidgesJames O [name], MD;  [facility];  Service: General;  Laterality: N/A;   . Laparotomy N/A 07/02/2013    Procedure: EXPLORATORY LAPAROTOMY with revision of jejunostomy feeding tube.;  Surgeon: Cherylynn RidgesJames O [name], MD;  [facility];  Service: General;  Laterality: N/A;  . Bowel resection N/A 07/02/2013    Procedure: SMALL BOWEL RESECTION;  Surgeon: Cherylynn RidgesJames O [name], MD;  [facility];  Service: General;  Laterality: N/A;    Jarold MottoSamantha Ree Alcalde MS, RD, LDN Inpatient Registered Dietitian Pager: [phone] After-hours pager: [phone]

## 2013-08-26 NOTE — Progress Notes (Addendum)
As per order RN notified Claiborne Billingsallahan, NP of patients CBG readings <90. Values for last 8 hours have been consistent at 81-82. No further orders at this time. Will continue to monitor.

## 2013-08-26 NOTE — Clinical Social Work Note (Signed)
CSW inquired about patient discharging today. Per MD's note plan is for possible DC to [facility] tomorrow.  Roddie McBryant Lynnea Vandervoort MSW, [place]CSWA, [place], [phone]

## 2013-08-26 NOTE — Progress Notes (Signed)
[facility] PROGRESS NOTE  Joan [name] ZOX:[id number] DOB: 12/05/1960 DOA: 08/23/2013 PCP: Joan EinsteinASANAYAKA,[name], MD  Brief narrative: 53 year old female with multiple medical problems including seizure, chronic encephalopathy, small bowel resection with jejunostomy feeding tube presenting with displaced feeding tube from SNF. IR consulted for J tube replacement, they were able to re-cannulate the tube. [facility] remains where the patient will be discharged. She apparently was not happy in [facility] as she felt neglected there. Per SW she is now ok to go back there. She is little somnolent this am so will give another 24 hours to make sure her mental status is better prior to discharge.   Assessment/Plan:   Active Problems:  Displaced feeding tube  - per IR, able to re cannulate, 3/28; feeding initiate per nutrition recomendations Thrush  - continue fluconazole 200 mg IV daily  Hypertension  - metoprolol 5 mg IV every 6 hours  H/O seizure disorder  - may continue Keppra 1500 mg every 12 hours, Vimpat 200 mg IV every 12 hours, valproate 750 mg IV Q 8 hours  Functional quadriplegia  - due to multiple medical co-morbidities  - will return to LTAC or SNF in next 24 hours  Thrombocytopenia  - platelets 133 this am; on admission platelet count was 160  - using SCD's for DVT prophylaxis   Code Status: full code  Family Communication: no family at the bedside  Disposition Plan: to SNF/LTAC in next 24 horus  Consultants:  IR  Procedures:  J tube replacement 3/28 Antibiotics:  None   Joan [name], Joan Seidenberg, MD  [facility] Pager [phone]  If 7PM-7AM, please contact night-coverage [email or web address] Password [email or web address] 08/26/2013, 10:01 AM   LOS: 3 days   HPI/Subjective: No events overnight.   Objective: Filed Vitals:   08/25/13 1422 08/25/13 2038 08/26/13 0004 08/26/13 0424  BP: 152/68 100/59 152/79 136/70  Pulse: 90 107 118 96  Temp: 98.2 F (36.8 C) 97.8 F (36.6 C)  98.7 F (37.1  C)  TempSrc: Oral Oral  Oral  Resp: 20 18  18   Height:      Weight:    70.126 kg (154 lb 9.6 oz)  SpO2: 98% 100%  100%    Intake/Output Summary (Last 24 hours) at 08/26/13 1001 Last data filed at 08/26/13 [id number]  Gross per 24 hour  Intake 965.83 ml  Output      0 ml  Net 965.83 ml    Exam:   General:  Pt is sleeping, briefly opens eyes when called her name  Cardiovascular: Regular rate and rhythm, S1/S2, no murmurs, no rubs, no gallops  Respiratory: Clear to auscultation bilaterally, no wheezing, no crackles, no rhonchi  Abdomen: Soft, non tender, non distended, bowel sounds present, peg in place  Extremities: No edema, pulses DP and PT palpable bilaterally  Neuro: Grossly nonfocal  Data Reviewed: Basic Metabolic Panel:  Recent Labs Lab 08/23/13 1836 08/24/13 0550 08/25/13 0657  NA 139 140 138  K 4.6 3.7 3.7  CL 97 99 98  CO2 29 27 28   GLUCOSE 83 86 83  BUN 15 13 9   CREATININE 0.33* 0.34* 0.38*  CALCIUM 9.7 9.2 9.1   Liver Function Tests:  Recent Labs Lab 08/23/13 1836 08/24/13 0550 08/25/13 0657  AST 31 22 22   ALT 18 15 12   ALKPHOS 59 53 51  BILITOT 0.3 0.3 0.3  PROT 6.6 5.7* 5.9*  ALBUMIN 3.1* 2.6* 2.6*   CBC:  Recent Labs Lab 08/23/13 1836 08/24/13 0550 08/25/13 0657 08/26/13 0710  WBC 7.1 7.2 6.3 7.0  NEUTROABS 4.3 4.8 3.7 4.5  HGB 13.2 12.1 11.7* 12.0  HCT 38.6 35.6* 34.6* 34.7*  MCV 97.5 97.3 99.4 96.9  PLT 160 130* 138* 133*   CBG:  Recent Labs Lab 08/25/13 1622 08/25/13 2020 08/26/13 0020 08/26/13 0448  GLUCAP 74 82 82 81    Recent Results (from the past 240 hour(s))  MRSA PCR SCREENING     Status: None   Collection Time    08/23/13  8:32 PM      Result Value Ref Range Status   MRSA by PCR NEGATIVE  NEGATIVE Final   Comment:            The GeneXpert MRSA Assay (FDA     approved for NASAL specimens     only), is one component of a     comprehensive MRSA colonization     surveillance program. It is not      intended to diagnose MRSA     infection nor to guide or     monitor treatment for     MRSA infections.     Studies: Ir Replc Duoden/jejuno Tube Percut W/fluoro  08/24/2013   CLINICAL DATA:  History of small bowel resection and indwelling surgically placed jejunostomy feeding catheter. The catheter has been inadvertently pulled out entirely.  EXAM: JEJUNOSTOMY CATHETER EXCHANGE  COMPARISON:  07/13/2013 catheter exchange procedure.  CONTRAST:  15 ml Omnipaque-300  FLUOROSCOPY TIME:  2 min and 6 seconds.  PROCEDURE: The procedure, risks, benefits, and alternatives were explained to the patient. Questions regarding the procedure were encouraged and answered. The patient understands and consents to the procedure.  The jejunostomy exit site was prepped with Betadine in a sterile fashion, and a sterile drape was applied covering the operative field. sterile gown and sterile gloves were used for the procedure.  The jejunostomy exit site was probed with a 5 [place] catheter. Contrast was injected. A hydrophilic guidewire was advanced into the jejunum. The diagnostic catheter was further advanced over the wire and into the jejunum.  There was initial attempt at advancing a 12 French red rubber jejunostomy catheter. A dilator was advanced over the wire. Eventually, a 10 French red rubber jejunostomy catheter was advanced over a hydrophilic guidewire.  Catheter position was confirmed with a fluoroscopic image obtained after injecting the catheter with contrast. The catheter was secured at the skin with an Ethilon retention suture. A Lopez feeding adapter was applied.  COMPLICATIONS: None.  FINDINGS: The jejunostomy tract was able to be opacified with contrast and catheterized. A 12 French catheter would not advance through the tract. A 10 French catheter was able to be advanced over a guidewire into the jejunum.  IMPRESSION: Replacement of jejunostomy tube with ability to catheterize the old tube tract. A 10 French red  rubber jejunostomy catheter was advanced into the jejunum.   Electronically Signed   By: Irish Lack M.D.   On: 08/24/2013 13:16    Scheduled Meds: . fluconazole (DIFLUCAN) IV  200 mg Intravenous Q24H  . haloperidol lactate  1 mg Intravenous Q12H  . lacosamide (VIMPAT) IV  200 mg Intravenous Q12H  . levETIRAcetam  1,500 mg Intravenous Q12H  . metoprolol  5 mg Intravenous 4 times per day  . pantoprazole (PROTONIX) IV  40 mg Intravenous Q12H  . valproate sodium  750 mg Intravenous 3 times per day   Continuous Infusions: . sodium chloride 20 mL/hr at 08/25/13 1348  . feeding supplement (VITAL AF 1.2  CAL) 1,000 mL (08/26/13 0703)

## 2013-08-27 LAB — GLUCOSE, CAPILLARY
GLUCOSE-CAPILLARY: 83 mg/dL (ref 70–99)
GLUCOSE-CAPILLARY: 91 mg/dL (ref 70–99)
GLUCOSE-CAPILLARY: 99 mg/dL (ref 70–99)

## 2013-08-27 LAB — CBC WITH DIFFERENTIAL/PLATELET
Basophils Absolute: 0 10*3/uL (ref 0.0–0.1)
Basophils Relative: 0 % (ref 0–1)
Eosinophils Absolute: 0.1 10*3/uL (ref 0.0–0.7)
Eosinophils Relative: 1 % (ref 0–5)
HCT: 33.3 % — ABNORMAL LOW (ref 36.0–46.0)
Hemoglobin: 11.2 g/dL — ABNORMAL LOW (ref 12.0–15.0)
LYMPHS PCT: 20 % (ref 12–46)
Lymphs Abs: 1.4 10*3/uL (ref 0.7–4.0)
MCH: 32.7 pg (ref 26.0–34.0)
MCHC: 33.6 g/dL (ref 30.0–36.0)
MCV: 97.1 fL (ref 78.0–100.0)
Monocytes Absolute: 1 10*3/uL (ref 0.1–1.0)
Monocytes Relative: 15 % — ABNORMAL HIGH (ref 3–12)
NEUTROS PCT: 64 % (ref 43–77)
Neutro Abs: 4.3 10*3/uL (ref 1.7–7.7)
PLATELETS: 142 10*3/uL — AB (ref 150–400)
RBC: 3.43 MIL/uL — AB (ref 3.87–5.11)
RDW: 13.7 % (ref 11.5–15.5)
WBC: 6.8 10*3/uL (ref 4.0–10.5)

## 2013-08-27 MED ORDER — FLUCONAZOLE IN SODIUM CHLORIDE 200-0.9 MG/100ML-% IV SOLN: 200.0000 mg | INTRAVENOUS | Status: DC

## 2013-08-27 MED ORDER — LACOSAMIDE 200 MG PO TABS: 200.0000 mg | ORAL_TABLET | Freq: Two times a day (BID) | ORAL | Status: DC

## 2013-08-27 MED ORDER — METOPROLOL TARTRATE 1 MG/ML IV SOLN: 5.0000 mg | Freq: Four times a day (QID) | INTRAVENOUS | Status: DC

## 2013-08-27 MED ORDER — VALPROIC ACID 250 MG/5ML PO SYRP: 750.0000 mg | ORAL_SOLUTION | Freq: Three times a day (TID) | ORAL | Status: DC

## 2013-08-27 MED ORDER — HALOPERIDOL 2 MG PO TABS: 2.0000 mg | ORAL_TABLET | Freq: Three times a day (TID) | ORAL | Status: DC | PRN

## 2013-08-27 MED ORDER — HALOPERIDOL LACTATE 5 MG/ML IJ SOLN: 1.0000 mg | Freq: Two times a day (BID) | INTRAMUSCULAR | Status: DC

## 2013-08-27 MED ORDER — SODIUM CHLORIDE 0.9 % IV SOLN: 200.0000 mg | Freq: Two times a day (BID) | INTRAVENOUS | Status: DC

## 2013-08-27 MED ORDER — LEVETIRACETAM 1000 MG PO TABS: 1500.0000 mg | ORAL_TABLET | Freq: Two times a day (BID) | ORAL | Status: DC

## 2013-08-27 MED ORDER — FREE WATER: 200.0000 mL | Freq: Four times a day (QID) | Status: DC

## 2013-08-27 MED ORDER — PANTOPRAZOLE SODIUM 40 MG IV SOLR: 40.0000 mg | Freq: Two times a day (BID) | INTRAVENOUS | Status: DC

## 2013-08-27 MED ORDER — SODIUM CHLORIDE 0.9 % IV SOLN: 1500.0000 mg | Freq: Two times a day (BID) | INTRAVENOUS | Status: DC

## 2013-08-27 MED ORDER — LORAZEPAM 2 MG/ML IJ SOLN: 0.5000 mg | Freq: Four times a day (QID) | INTRAMUSCULAR | Status: DC | PRN

## 2013-08-27 MED ORDER — MORPHINE SULFATE 2 MG/ML IJ SOLN: 2.0000 mg | Freq: Four times a day (QID) | INTRAMUSCULAR | Status: DC | PRN

## 2013-08-27 MED ORDER — VITAL AF 1.2 CAL PO LIQD: 1000.0000 mL | ORAL | Status: DC

## 2013-08-27 MED ORDER — METOPROLOL TARTRATE 50 MG PO TABS: 50.0000 mg | ORAL_TABLET | Freq: Two times a day (BID) | ORAL | Status: DC

## 2013-08-27 MED ORDER — VALPROATE SODIUM 500 MG/5ML IV SOLN: 750.0000 mg | Freq: Three times a day (TID) | INTRAVENOUS | Status: DC

## 2013-08-27 NOTE — Clinical Social Work Note (Signed)
Per MD patient ready to DC back to [facility]. RN, patient, patient's son, and facility notified of DC. RN given number for report. DC packet on chart. AMbulance transport requested for patient. CSW signing off.  Roddie McBryant Yale Golla MSW, [place]CSWA, [place], [phone]

## 2013-08-27 NOTE — Progress Notes (Signed)
Joan PeelingLora [name] discharged Skilled nursing facility per MD order.  Report called to receiving Joan LoaPriscilla Crawford, RN at [facility].     Medication List    STOP taking these medications       cholestyramine 4 G packet  Commonly known as:  QUESTRAN     clonazePAM 0.5 MG tablet  Commonly known as:  KLONOPIN     HYDROcodone-acetaminophen 5-325 MG per tablet  Commonly known as:  NORCO/VICODIN     oxycodone 5 MG capsule  Commonly known as:  OXY-IR     pantoprazole sodium 40 mg/20 mL Pack  Commonly known as:  PROTONIX  Replaced by:  pantoprazole 40 MG injection     risperiDONE 0.5 MG disintegrating tablet  Commonly known as:  RISPERDAL M-TABS      TAKE these medications       acetaminophen 325 MG tablet  Commonly known as:  TYLENOL  2 tablets (650 mg total) by Per J Tube route every 6 (six) hours as needed for mild pain (or Fever >/= 101).     enoxaparin 40 MG/0.4ML injection  Commonly known as:  LOVENOX  Inject 40 mg into the skin daily.     feeding supplement (VITAL AF 1.2 CAL) Liqd  Place 1,000 mLs into feeding tube continuous.     fluconazole 200-0.9 MG/100ML-% IVPB  Commonly known as:  DIFLUCAN  Inject 100 mLs (200 mg total) into the vein daily.     free water Soln  Place 200 mLs into feeding tube 4 (four) times daily.     haloperidol 2 MG tablet  Commonly known as:  HALDOL  Place 1 tablet (2 mg total) into feeding tube every 8 (eight) hours as needed for agitation.     lacosamide 200 MG Tabs tablet  Commonly known as:  VIMPAT  Place 1 tablet (200 mg total) into feeding tube 2 (two) times daily.     levETIRAcetam 1000 MG tablet  Commonly known as:  KEPPRA  Place 1.5 tablets (1,500 mg total) into feeding tube 2 (two) times daily.     LORazepam 2 MG/ML injection  Commonly known as:  ATIVAN  Inject 0.25-0.5 mLs (0.5-1 mg total) into the vein every 6 (six) hours as needed for anxiety.     metoprolol 50 MG tablet  Commonly known as:  LOPRESSOR  Place 1 tablet (50 mg  total) into feeding tube 2 (two) times daily.     mirtazapine 15 MG disintegrating tablet  Commonly known as:  REMERON SOL-TAB  1 tablet (15 mg total) by Per J Tube route at bedtime.     morphine 2 MG/ML injection  Inject 1-2 mLs (2-4 mg total) into the vein every 6 (six) hours as needed.     pantoprazole 40 MG injection  Commonly known as:  PROTONIX  Inject 40 mg into the vein every 12 (twelve) hours.     Valproic Acid 250 MG/5ML Syrp syrup  Commonly known as:  DEPAKENE  Take 15 mLs (750 mg total) by mouth every 8 (eight) hours.        Patients skin is clean, dry and intact, with moisture associated skin damage to perineal & sacrum.  IV site discontinued and catheter remains intact. Site without signs and symptoms of complications. Dressing and pressure applied.  Patient transported on a stretcher by non emergent EMS,  no distress noted upon discharge.  Joan [name], Joan [name] 08/27/2013 4:14 PM

## 2013-08-27 NOTE — Progress Notes (Signed)
Chaplain responded to spiritual care consult. Patient was awake, engaged, and grateful for support. Chaplain listened empathically to patient's concerns, providing emotional support and prayer. Please page if needed.   Maurene CapesHillary D Irusta [phone]

## 2013-08-27 NOTE — Discharge Summary (Addendum)
Physician Discharge Summary  Joan [name]:[id number] DOB: 11-09-1960 DOA: 08/23/2013  PCP: [name], MD  Admit date: 08/23/2013 Discharge date: 08/27/2013  Recommendations for Outpatient Follow-up:  1. Pt will be discharged to [facility]  2. Please change medications to per tube as clinically indicated 3. Pleas note that pt was discharged on IV medications  4. Continue Fluconazole IV for oral thrush for 7 more days post discharge and reassess   Discharge Diagnoses:  Active Problems:   Complication of feeding tube  Discharge Condition: stable  Diet recommendation: tube feeding   History of present illness:  53 year old female with multiple medical problems including seizure, chronic encephalopathy, small bowel resection with jejunostomy feeding tube presenting with displaced feeding tube from SNF. IR consulted for J tube replacement, they were able to re-cannulate the tube. [facility] remains where the patient will be discharged. She apparently was not happy in [facility] as she felt neglected there. Per SW she is now ok to go back there. She is little somnolent this am so will give another 24 hours to make sure her mental status is better prior to discharge.   Assessment/Plan:  Active Problems:  Displaced feeding tube  - per IR, able to re cannulate, 3/28; feeding initiate per nutrition recomendations  Thrush  - continue fluconazole 200 mg IV daily for 7 more days post discharge  Hypertension  - metoprolol 5 mg IV every 6 hours  H/O seizure disorder  - may continue Keppra 1500 mg every 12 hours per tube, Vimpat 200 mg per tube every 12 hours, valproate 750 mg solution per tube Q 8 hours  - change to per tube as clinically indicated  Functional quadriplegia  - due to multiple medical co-morbidities  - will return to [facility] today  Thrombocytopenia  - on admission platelet count was 160  - using SCD's for DVT prophylaxis while inpatient  Agitation - may use haldol per tube  as prescribed  Code Status: full code  Family Communication: no family at the bedside   Consultants:  IR  Procedures:  J tube replacement 3/28 Antibiotics:  None   Signed:  Manson Passey, MD  [facility] 08/27/2013, 11:42 AM  Pager #: [phone]  Discharge Exam: Filed Vitals:   08/27/13 0530  BP: 111/76  Pulse: 106  Temp: 99.5 F (37.5 C)  Resp: 18   Filed Vitals:   08/26/13 1417 08/26/13 2243 08/27/13 0058 08/27/13 0530  BP: 126/77 155/70 124/77 111/76  Pulse: 96 117 121 106  Temp: 99 F (37.2 C) 98.4 F (36.9 C)  99.5 F (37.5 C)  TempSrc: Oral Oral  Oral  Resp: 18 20  18   Height:      Weight:    69.718 kg (153 lb 11.2 oz)  SpO2: 100% 100%  98%    General: Pt is alert, follows commands appropriately, not in acute distress Cardiovascular: Regular rate and rhythm, no rubs, no gallops Respiratory: Clear to auscultation bilaterally, no wheezing, diminished breath sounds at bases  Abdominal: Soft, non tender, non distended, bowel sounds +, no guarding, PEG tube   Discharge Instructions  Discharge Orders   Future Orders Complete By Expires   Diet - low sodium heart healthy  As directed    Increase activity slowly  As directed          Medication List    STOP taking these medications       cholestyramine 4 G packet  Commonly known as:  QUESTRAN     clonazePAM  0.5 MG tablet  Commonly known as:  KLONOPIN     HYDROcodone-acetaminophen 5-325 MG per tablet  Commonly known as:  NORCO/VICODIN     oxycodone 5 MG capsule  Commonly known as:  OXY-IR     pantoprazole sodium 40 mg/20 mL Pack  Commonly known as:  PROTONIX  Replaced by:  pantoprazole 40 MG injection     risperiDONE 0.5 MG disintegrating tablet  Commonly known as:  RISPERDAL M-TABS      TAKE these medications       acetaminophen 325 MG tablet  Commonly known as:  TYLENOL  2 tablets (650 mg total) by Per J Tube route every 6 (six) hours as needed for mild pain (or Fever >/=  101).     enoxaparin 40 MG/0.4ML injection  Commonly known as:  LOVENOX  Inject 40 mg into the skin daily.     feeding supplement (VITAL AF 1.2 CAL) Liqd  Place 1,000 mLs into feeding tube continuous.     fluconazole 200-0.9 MG/100ML-% IVPB  Commonly known as:  DIFLUCAN  Inject 100 mLs (200 mg total) into the vein daily.     free water Soln  Place 200 mLs into feeding tube 4 (four) times daily.     haloperidol 2 MG tablet  Commonly known as:  HALDOL  Place 1 tablet (2 mg total) into feeding tube every 8 (eight) hours as needed for agitation.     lacosamide 200 MG Tabs tablet  Commonly known as:  VIMPAT  Place 1 tablet (200 mg total) into feeding tube 2 (two) times daily.     levETIRAcetam 1000 MG tablet  Commonly known as:  KEPPRA  Place 1.5 tablets (1,500 mg total) into feeding tube 2 (two) times daily.     LORazepam 2 MG/ML injection  Commonly known as:  ATIVAN  Inject 0.25-0.5 mLs (0.5-1 mg total) into the vein every 6 (six) hours as needed for anxiety.     metoprolol 50 MG tablet  Commonly known as:  LOPRESSOR  Place 1 tablet (50 mg total) into feeding tube 2 (two) times daily.     mirtazapine 15 MG disintegrating tablet  Commonly known as:  REMERON SOL-TAB  1 tablet (15 mg total) by Per J Tube route at bedtime.     morphine 2 MG/ML injection  Inject 1-2 mLs (2-4 mg total) into the vein every 6 (six) hours as needed.     pantoprazole 40 MG injection  Commonly known as:  PROTONIX  Inject 40 mg into the vein every 12 (twelve) hours.     Valproic Acid 250 MG/5ML Syrp syrup  Commonly known as:  DEPAKENE  Take 15 mLs (750 mg total) by mouth every 8 (eight) hours.                                                                                                    Follow-up Information   Schedule an appointment as soon as possible for a visit with [facility]ANAYAK[facility],[name], MD.   Specialty:  Internal Medicine   Contact information:   [address]  [place] [address] [phone]        The results of significant diagnostics from this hospitalization (including imaging, microbiology, ancillary and laboratory) are listed below for reference.    Significant Diagnostic Studies: Ir Replc Duoden/jejuno Tube Percut W/fluoro  08/24/2013   CLINICAL DATA:  History of small bowel resection and indwelling surgically placed jejunostomy feeding catheter. The catheter has been inadvertently pulled out entirely.  EXAM: JEJUNOSTOMY CATHETER EXCHANGE  COMPARISON:  07/13/2013 catheter exchange procedure.  CONTRAST:  15 ml Omnipaque-300  FLUOROSCOPY TIME:  2 min and 6 seconds.  PROCEDURE: The procedure, risks, benefits, and alternatives were explained to the patient. Questions regarding the procedure were encouraged and answered. The patient understands and consents to the procedure.  The jejunostomy exit site was prepped with Betadine in a sterile fashion, and a sterile drape was applied covering the operative field. sterile gown and sterile gloves were used for the procedure.  The jejunostomy exit site was probed with a 5 [place] catheter. Contrast was injected. A hydrophilic guidewire was advanced into the jejunum. The diagnostic catheter was further advanced over the wire and into the jejunum.  There was initial attempt at advancing a 12 French red rubber jejunostomy catheter. A dilator was advanced over the wire. Eventually, a 10 French red rubber jejunostomy catheter was advanced over a hydrophilic guidewire.  Catheter position was confirmed with a fluoroscopic image obtained after injecting the catheter with contrast. The catheter was secured at the skin with an Ethilon retention suture. A Lopez feeding adapter was applied.  COMPLICATIONS: None.  FINDINGS: The jejunostomy tract was able to be opacified with contrast and catheterized. A 12 French catheter would not advance through the tract. A 10 French catheter was able to be advanced over a  guidewire into the jejunum.  IMPRESSION: Replacement of jejunostomy tube with ability to catheterize the old tube tract. A 10 French red rubber jejunostomy catheter was advanced into the jejunum.   Electronically Signed   By: Irish Lack M.D.   On: 08/24/2013 13:16   Dg Chest Port 1 View  07/30/2013   CLINICAL DATA:  Followup infiltrates.  Cough  EXAM: PORTABLE CHEST - 1 VIEW  COMPARISON:  07/26/2013  FINDINGS: Progression of bibasilar airspace disease. This may be atelectasis or pneumonia. Increase in small right effusion. Negative for edema.  IMPRESSION: Progression of bibasilar consolidation   Electronically Signed   By: Marlan Palau M.D.   On: 07/30/2013 08:10    Microbiology: Recent Results (from the past 240 hour(s))  MRSA PCR SCREENING     Status: None   Collection Time    08/23/13  8:32 PM      Result Value Ref Range Status   MRSA by PCR NEGATIVE  NEGATIVE Final   Comment:            The GeneXpert MRSA Assay (FDA     approved for NASAL specimens     only), is one component of a     comprehensive MRSA colonization     surveillance program. It is not     intended to diagnose MRSA     infection nor to guide or     monitor treatment for     MRSA infections.     Labs: Basic Metabolic Panel:  Recent Labs Lab 08/23/13 1836 08/24/13 0550 08/25/13 0657  NA 139 140 138  K 4.6 3.7 3.7  CL 97 99 98  CO2 29 27 28   GLUCOSE 83 86 83  BUN 15  13 9  CREATININE 0.33* 0.34* 0.38*  CALCIUM 9.7 9.2 9.1   Liver Function Tests:  Recent Labs Lab 08/23/13 1836 08/24/13 0550 08/25/13 0657  AST 31 22 22   ALT 18 15 12   ALKPHOS 59 53 51  BILITOT 0.3 0.3 0.3  PROT 6.6 5.7* 5.9*  ALBUMIN 3.1* 2.6* 2.6*   No results found for this basename: LIPASE, AMYLASE,  in the last 168 hours No results found for this basename: AMMONIA,  in the last 168 hours CBC:  Recent Labs Lab 08/23/13 1836 08/24/13 0550 08/25/13 0657 08/26/13 0710 08/27/13 0630  WBC 7.1 7.2 6.3 7.0 6.8   NEUTROABS 4.3 4.8 3.7 4.5 4.3  HGB 13.2 12.1 11.7* 12.0 11.2*  HCT 38.6 35.6* 34.6* 34.7* 33.3*  MCV 97.5 97.3 99.4 96.9 97.1  PLT 160 130* 138* 133* 142*   Cardiac Enzymes: No results found for this basename: CKTOTAL, CKMB, CKMBINDEX, TROPONINI,  in the last 168 hours BNP: BNP (last 3 results) No results found for this basename: PROBNP,  in the last 8760 hours CBG:  Recent Labs Lab 08/26/13 1509 08/26/13 2043 08/26/13 2349 08/27/13 0352 08/27/13 0804  GLUCAP 85 89 89 83 91    Time coordinating discharge: Over 30 minutes

## 2013-08-27 NOTE — Discharge Instructions (Signed)
Altered Mental Status °Altered mental status most often refers to an abnormal change in your responsiveness and awareness. It can affect your speech, thought, mobility, memory, attention span, or alertness. It can range from slight confusion to complete unresponsiveness (coma). Altered mental status can be a sign of a serious underlying medical condition. Rapid evaluation and medical treatment is necessary for patients having an altered mental status. °CAUSES  °· Low blood sugar (hypoglycemia) or diabetes. °· Severe loss of body fluids (dehydration) or a body salt (electrolyte) imbalance. °· A stroke or other neurologic problem, such as dementia or delirium. °· A head injury or tumor. °· A drug or alcohol overdose. °· Exposure to toxins or poisons. °· Depression, anxiety, and stress. °· A low oxygen level (hypoxia). °· An infection. °· Blood loss. °· Twitching or shaking (seizure). °· Heart problems, such as heart attack or heart rhythm problems (arrhythmias). °· A body temperature that is too low or too high (hypothermia or hyperthermia). °DIAGNOSIS  °A diagnosis is based on your history, symptoms, physical and neurologic examinations, and diagnostic tests. Diagnostic tests may include: °· Measurement of your blood pressure, pulse, breathing, and oxygen levels (vital signs). °· Blood tests. °· Urine tests. °· X-ray exams. °· A computerized magnetic scan (magnetic resonance imaging, MRI). °· A computerized X-ray scan (computed tomography, CT scan). °TREATMENT  °Treatment will depend on the cause. Treatment may include: °· Management of an underlying medical or mental health condition. °· Critical care or support in the hospital. °HOME CARE INSTRUCTIONS  °· Only take over-the-counter or prescription medicines for pain, discomfort, or fever as directed by your caregiver. °· Manage underlying conditions as directed by your caregiver. °· Eat a healthy, well-balanced diet to maintain strength. °· Join a support group or  prevention program to cope with the condition or trauma that caused the altered mental status. Ask your caregiver to help choose a program that works for you. °· Follow up with your caregiver for further examination, therapy, or testing as directed. °SEEK MEDICAL CARE IF:  °· You feel unwell or have chills. °· You or your family notice a change in your behavior or your alertness. °· You have trouble following your caregiver's treatment plan. °· You have questions or concerns. °SEEK IMMEDIATE MEDICAL CARE IF:  °· You have a rapid heartbeat or have chest pain. °· You have difficulty breathing. °· You have a fever. °· You have a headache with a stiff neck. °· You cough up blood. °· You have blood in your urine or stool. °· You have severe agitation or confusion. °MAKE SURE YOU:  °· Understand these instructions. °· Will watch your condition. °· Will get help right away if you are not doing well or get worse. °Document Released: 11/03/2009 Document Revised: 08/08/2011 Document Reviewed: 11/03/2009 °ExitCare® Patient Information ©2014 [employer]. ° °

## 2013-09-02 ENCOUNTER — Telehealth: Payer: Self-pay | Admitting: *Deleted

## 2013-09-02 NOTE — Telephone Encounter (Signed)
Faxed to Dr. Judyann Munsonynthia Snider with [facility] per Dr. Pearlean BrownieSethi request. [phone].  Confirmation done.

## 2013-09-02 NOTE — Telephone Encounter (Signed)
Pt still in hospital. Could not see appointment.

## 2013-09-03 ENCOUNTER — Other Ambulatory Visit: Payer: Self-pay | Admitting: *Deleted

## 2013-09-03 DIAGNOSIS — A8100 Creutzfeldt-Jakob disease, unspecified: Secondary | ICD-10-CM

## 2013-09-03 NOTE — Progress Notes (Signed)
Referral for ID consult placed in EPIC.

## 2013-09-23 ENCOUNTER — Other Ambulatory Visit (HOSPITAL_COMMUNITY): Payer: Self-pay | Admitting: Internal Medicine

## 2013-09-23 DIAGNOSIS — K56609 Unspecified intestinal obstruction, unspecified as to partial versus complete obstruction: Secondary | ICD-10-CM

## 2013-09-24 ENCOUNTER — Ambulatory Visit (HOSPITAL_COMMUNITY)
Admission: RE | Admit: 2013-09-24 | Discharge: 2013-09-24 | Disposition: A | Discharge status: Home / Self Care | Payer: Medicare Other | Source: Ambulatory Visit | Attending: Internal Medicine | Admitting: Internal Medicine

## 2013-09-24 DIAGNOSIS — K56609 Unspecified intestinal obstruction, unspecified as to partial versus complete obstruction: Secondary | ICD-10-CM

## 2013-10-08 ENCOUNTER — Telehealth (INDEPENDENT_AMBULATORY_CARE_PROVIDER_SITE_OTHER): Payer: Self-pay

## 2013-10-08 NOTE — Telephone Encounter (Signed)
Pt is s/p exploratory lap and placement of J-tube on 07/02/13 by Dr. Lindie SpruceWyatt.  [facility].  She [facility].  [facility] reports that her J-tube came all the way out 3 weeks ago, and wound site has healed over.  Patient refused transport to the hospital so an NG tube was inserted.  NG tube now needs to come out so they are requesting an order for a new J-tube.  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  Please advise how to pursue this matter.

## 2013-10-09 NOTE — Telephone Encounter (Signed)
LMOV with Dr. Dixon BoosWyatt's response.  Pt would require another operation for a new J-tube.  He will not operate on this patient again for this.

## 2013-10-17 ENCOUNTER — Emergency Department (HOSPITAL_COMMUNITY): Payer: BC Managed Care – PPO

## 2013-10-17 ENCOUNTER — Emergency Department (HOSPITAL_COMMUNITY)
Admission: EM | Admit: 2013-10-17 | Discharge: 2013-10-17 | Disposition: A | Discharge status: Home / Self Care | Payer: BC Managed Care – PPO | Attending: Emergency Medicine | Admitting: Emergency Medicine

## 2013-10-17 ENCOUNTER — Encounter (HOSPITAL_COMMUNITY): Payer: Self-pay | Admitting: Emergency Medicine

## 2013-10-17 DIAGNOSIS — IMO0001 Reserved for inherently not codable concepts without codable children: Secondary | ICD-10-CM | POA: Insufficient documentation

## 2013-10-17 DIAGNOSIS — I10 Essential (primary) hypertension: Secondary | ICD-10-CM | POA: Insufficient documentation

## 2013-10-17 DIAGNOSIS — Z880 Allergy status to penicillin: Secondary | ICD-10-CM | POA: Insufficient documentation

## 2013-10-17 DIAGNOSIS — R000 Tachycardia, unspecified: Secondary | ICD-10-CM | POA: Insufficient documentation

## 2013-10-17 DIAGNOSIS — Z4659 Encounter for fitting and adjustment of other gastrointestinal appliance and device: Secondary | ICD-10-CM

## 2013-10-17 DIAGNOSIS — Z79899 Other long term (current) drug therapy: Secondary | ICD-10-CM | POA: Insufficient documentation

## 2013-10-17 DIAGNOSIS — Z8679 Personal history of other diseases of the circulatory system: Secondary | ICD-10-CM | POA: Insufficient documentation

## 2013-10-17 DIAGNOSIS — Z431 Encounter for attention to gastrostomy: Secondary | ICD-10-CM | POA: Insufficient documentation

## 2013-10-17 DIAGNOSIS — F411 Generalized anxiety disorder: Secondary | ICD-10-CM | POA: Insufficient documentation

## 2013-10-17 DIAGNOSIS — G40909 Epilepsy, unspecified, not intractable, without status epilepticus: Secondary | ICD-10-CM | POA: Insufficient documentation

## 2013-10-17 LAB — VALPROIC ACID LEVEL: Valproic Acid Lvl: 59.6 ug/mL (ref 50.0–100.0)

## 2013-10-17 LAB — CBC WITH DIFFERENTIAL/PLATELET
BASOS ABS: 0 10*3/uL (ref 0.0–0.1)
Basophils Relative: 0 % (ref 0–1)
EOS PCT: 0 % (ref 0–5)
Eosinophils Absolute: 0 10*3/uL (ref 0.0–0.7)
HCT: 38.1 % (ref 36.0–46.0)
Hemoglobin: 12.4 g/dL (ref 12.0–15.0)
LYMPHS PCT: 24 % (ref 12–46)
Lymphs Abs: 1.7 10*3/uL (ref 0.7–4.0)
MCH: 31.3 pg (ref 26.0–34.0)
MCHC: 32.5 g/dL (ref 30.0–36.0)
MCV: 96.2 fL (ref 78.0–100.0)
Monocytes Absolute: 0.9 10*3/uL (ref 0.1–1.0)
Monocytes Relative: 13 % — ABNORMAL HIGH (ref 3–12)
NEUTROS PCT: 63 % (ref 43–77)
Neutro Abs: 4.4 10*3/uL (ref 1.7–7.7)
PLATELETS: 200 10*3/uL (ref 150–400)
RBC: 3.96 MIL/uL (ref 3.87–5.11)
RDW: 12.6 % (ref 11.5–15.5)
WBC: 7.1 10*3/uL (ref 4.0–10.5)

## 2013-10-17 LAB — BASIC METABOLIC PANEL
BUN: 21 mg/dL (ref 6–23)
CALCIUM: 9.8 mg/dL (ref 8.4–10.5)
CO2: 28 mEq/L (ref 19–32)
CREATININE: 0.42 mg/dL — AB (ref 0.50–1.10)
Chloride: 99 mEq/L (ref 96–112)
GFR calc Af Amer: >90 mL/min (ref >90)
Glucose, Bld: 89 mg/dL (ref 70–99)
Potassium: 3.8 mEq/L (ref 3.7–5.3)
SODIUM: 141 meq/L (ref 137–147)

## 2013-10-17 MED ORDER — LORAZEPAM 2 MG/ML IJ SOLN
1.0000 mg | Freq: Once | INTRAMUSCULAR | Status: AC
  Administered 2013-10-17: 1 mg via INTRAVENOUS
  Filled 2013-10-17: qty 1

## 2013-10-17 NOTE — ED Provider Notes (Signed)
CSN: [id number]     Arrival date & time 10/17/13  1821 History   First MD Initiated Contact with Patient 10/17/13 1828     Chief Complaint  Patient presents with  . peg tube      Patient is a 53 year old female with past medical history of CJD, seizures, hypertension, and prior strokes who presents with concern for her not having feeding tube.  Per nursing home patient had a PEG tube in place however this was pulled out approximately 8 weeks ago. Since that time they have been giving patient nutrition/medicines via NG tube. Nursing facility contacted surgeons for possible placement of PEG tube. However per surgery note she is not a surgical candidate. NG tube was removed 2 days ago. Patient is not able to tolerate by mouth intake, and thus patient was sent here for further evaluation. Patient has no complaints upon arrival.      (Consider location/radiation/quality/duration/timing/severity/associated sxs/prior Treatment) Patient is a 53 y.o. female presenting with general illness. The history is provided by the patient and medical records. No language interpreter was used.  Illness Severity:  Severe Onset quality:  Gradual Timing:  Constant Progression:  Worsening Chronicity:  New Associated symptoms: no abdominal pain, no chest pain, no diarrhea, no loss of consciousness, no shortness of breath and no vomiting     Past Medical History  Diagnosis Date  . Peripheral neuropathy   . Fibromyalgia   . Hypertension   . Anxiety   . Catatonia   . Seizures    Past Surgical History  Procedure Laterality Date  . Gastric bypass    . Abdominal hysterectomy    . Jejunostomy N/A 06/25/2013    Procedure:  OPEN JEJUNOSTOMY FEEDING TUBE ;  Surgeon: Cherylynn Ridges, MD;  Location: [facility] OR;  Service: General;  Laterality: N/A;  . Laparotomy N/A 07/02/2013    Procedure: EXPLORATORY LAPAROTOMY with revision of jejunostomy feeding tube.;  Surgeon: Cherylynn Ridges, MD;  Location: [facility] OR;  Service: General;   Laterality: N/A;  . Bowel resection N/A 07/02/2013    Procedure: SMALL BOWEL RESECTION;  Surgeon: Cherylynn Ridges, MD;  Location: [facility] OR;  Service: General;  Laterality: N/A;   Family History  Problem Relation Age of Onset  . Hypertension Mother   . Diabetes Father    History  Substance Use Topics  . Smoking status: Never Smoker   . Smokeless tobacco: Not on file  . Alcohol Use: No   OB History   Grav Para Term Preterm Abortions TAB SAB Ect Mult Living                 Review of Systems  Respiratory: Negative for shortness of breath.   Cardiovascular: Negative for chest pain.  Gastrointestinal: Negative for vomiting, abdominal pain and diarrhea.  Neurological: Negative for loss of consciousness.  All other systems reviewed and are negative.     Allergies  Penicillins; Sertraline hcl; Morphine and related; and Paroxetine hcl  Home Medications   Prior to Admission medications   Medication Sig Start Date End Date Taking? Authorizing Provider  acetaminophen (TYLENOL) 325 MG tablet 2 tablets (650 mg total) by Per J Tube route every 6 (six) hours as needed for mild pain (or Fever >/= 101). 07/30/13  Yes Russella Dar, NP  enoxaparin (LOVENOX) 40 MG/0.4ML injection Inject 40 mg into the skin daily.   Yes Historical Provider, MD  fluconazole (DIFLUCAN) 200-0.9 MG/100ML-% IVPB Inject 100 mLs (200 mg total) into the vein daily.  08/27/13  Yes Alison MurrayAlma M Devine, MD  lacosamide (VIMPAT) 200 MG TABS tablet Place 1 tablet (200 mg total) into feeding tube 2 (two) times daily. 08/27/13  Yes Alison MurrayAlma M Devine, MD  levETIRAcetam (KEPPRA) 1000 MG tablet Place 1.5 tablets (1,500 mg total) into feeding tube 2 (two) times daily. 08/27/13  Yes Alison MurrayAlma M Devine, MD  LORazepam (ATIVAN) 2 MG/ML injection Inject 0.25-0.5 mLs (0.5-1 mg total) into the vein every 6 (six) hours as needed for anxiety. 08/27/13  Yes Alison MurrayAlma M Devine, MD  metoprolol (LOPRESSOR) 50 MG tablet Place 1 tablet (50 mg total) into feeding tube 2 (two)  times daily. 08/27/13  Yes Alison MurrayAlma M Devine, MD  mirtazapine (REMERON SOL-TAB) 15 MG disintegrating tablet 1 tablet (15 mg total) by Per J Tube route at bedtime. 07/30/13  Yes Russella DarAllison L Ellis, NP  Nutritional Supplements (FEEDING SUPPLEMENT, VITAL AF 1.2 CAL,) LIQD Place 1,000 mLs into feeding tube continuous. 08/27/13  Yes Alison MurrayAlma M Devine, MD  pantoprazole (PROTONIX) 40 MG injection Inject 40 mg into the vein every 12 (twelve) hours. 08/27/13  Yes Alison MurrayAlma M Devine, MD  potassium chloride (KLOR-CON) 20 MEQ packet Take 20 mEq by mouth daily.   Yes Historical Provider, MD  Valproic Acid (DEPAKENE) 250 MG/5ML SYRP syrup Take 15 mLs (750 mg total) by mouth every 8 (eight) hours. 08/27/13  Yes Alison MurrayAlma M Devine, MD  Water For Irrigation, Sterile (FREE WATER) SOLN Place 200 mLs into feeding tube 4 (four) times daily. 08/27/13  Yes Alison MurrayAlma M Devine, MD   BP 139/78  Pulse 118  Temp(Src) 99.4 F (37.4 C) (Oral)  Resp 18  SpO2 98% Physical Exam  Nursing note and vitals reviewed. Constitutional: She appears well-developed and well-nourished. No distress.  HENT:  Head: Normocephalic and atraumatic.  Right Ear: External ear normal.  Left Ear: External ear normal.  Nose: Nose normal.  Eyes: Pupils are equal, round, and reactive to light.  Neck: Normal range of motion. Neck supple.  Cardiovascular: Regular rhythm and intact distal pulses.  Tachycardia present.  Exam reveals no gallop and no friction rub.   No murmur heard. Pulmonary/Chest: Effort normal and breath sounds normal. No respiratory distress. She has no wheezes.  Abdominal: Soft. Bowel sounds are normal. She exhibits no distension. There is no tenderness. There is no rebound and no guarding.  Musculoskeletal: Normal range of motion. She exhibits no edema and no tenderness.  Neurological: She is alert. No cranial nerve deficit. GCS eye subscore is 4. GCS verbal subscore is 5. GCS motor subscore is 6.  Patient not able to follow commands when asked to move  extremities which is not a new finding per prior notes.  She has rhythmic movements of UEs which has been documented on prior exams.    Skin: Skin is warm and dry.  Psychiatric: She has a normal mood and affect.    ED Course  Procedures (including critical care time) Labs Review Labs Reviewed  BASIC METABOLIC PANEL - Abnormal; Notable for the following:    Creatinine, Ser 0.42 (*)    All other components within normal limits  CBC WITH DIFFERENTIAL - Abnormal; Notable for the following:    Monocytes Relative 13 (*)    All other components within normal limits  VALPROIC ACID LEVEL    Imaging Review No results found.   EKG Interpretation None      MDM   Final diagnoses:  Encounter for nasogastric (NG) tube placement   Patient presents to the emergency department  via long-term care facility due to concerns for not being able to get nutrition or medicines. Please see history of present illness for further details. Vital signs remarkable for heart rate in the 110s but looking back on prior clinic visit/admission his heart rate noted to be consistently above 100. On exam patient with no new neurological deficits, no signs of dehydration, and otherwise as above. Basic labs were obtained to rule out evidence of dehydration or valproic acid abnormalities.  Labs were unremarkable. NG tube was placed.   X-ray revealed NG tube to be near esophagus and thus advanced. Long-term care facility contacted and made aware the patient does have NG tube and had no indication for admission. They were in agreement that [facility] and they would discuss long term options.    Johnney Ouerek Hellena Pridgen, MD 10/18/13 [phone]

## 2013-10-17 NOTE — ED Notes (Signed)
Provider at the bedside.  

## 2013-10-17 NOTE — ED Notes (Signed)
Report called to Randy, RN.

## 2013-10-17 NOTE — ED Provider Notes (Signed)
Patient seen/examined in the Emergency Department in conjunction with Resident Physician Provider Ronald LoboKunz Patient presents for evaluation as she is not able to receive nutrition due to NG tube removed as well as no PEG tube at this time Exam : awake/alert, no distress, abdomen soft to palpation Plan: labs pending   Joya Gaskinsonald W Keelan [place], MD 10/17/13 2008

## 2013-10-17 NOTE — ED Notes (Signed)
Report called to [facility]. [facility] transporting patient. NGT advanced per Dr. Bebe ShaggyWickline 4cm.

## 2013-10-17 NOTE — ED Notes (Signed)
Pt to department via [facility] from [facility]- pt has a hx of CJD and is terminal. Staff reports that she has a feeding tube that was pulled out and then had an NG that was taken out after 8 weeks. States that she was sent here for a PEG tube placement but staff reports that our radiologist will not place a PEG. Staff reports that they can't provide the level of care that she needs at this time. No vital signs done by [facility] prior to arrival.

## 2013-10-20 NOTE — ED Provider Notes (Signed)
I have personally seen and examined the patient.  I have discussed the plan of care with the resident.  I have reviewed the documentation on PMH/FH/Soc. History.  I have reviewed the documentation of the resident and agree.  Pt with complex history, however stable in ED.  Apparently has been seen previously by surgery and no longer candidate for J-tube.  NG tube placed.  Pt sent back to facility and was tachycardic on discharge but partially due to anxiety with NG tube  Joya Gaskins, MD 10/20/13 1343

## 2013-11-21 ENCOUNTER — Emergency Department (HOSPITAL_COMMUNITY)
Admission: EM | Admit: 2013-11-21 | Discharge: 2013-11-21 | Disposition: A | Discharge status: Home / Self Care | Attending: Emergency Medicine | Admitting: Emergency Medicine

## 2013-11-21 ENCOUNTER — Encounter (HOSPITAL_COMMUNITY): Payer: Self-pay | Admitting: Emergency Medicine

## 2013-11-21 ENCOUNTER — Emergency Department (HOSPITAL_COMMUNITY)

## 2013-11-21 DIAGNOSIS — Z7901 Long term (current) use of anticoagulants: Secondary | ICD-10-CM | POA: Insufficient documentation

## 2013-11-21 DIAGNOSIS — Z4659 Encounter for fitting and adjustment of other gastrointestinal appliance and device: Secondary | ICD-10-CM

## 2013-11-21 DIAGNOSIS — Z8739 Personal history of other diseases of the musculoskeletal system and connective tissue: Secondary | ICD-10-CM | POA: Insufficient documentation

## 2013-11-21 DIAGNOSIS — Z880 Allergy status to penicillin: Secondary | ICD-10-CM | POA: Insufficient documentation

## 2013-11-21 DIAGNOSIS — R000 Tachycardia, unspecified: Secondary | ICD-10-CM | POA: Insufficient documentation

## 2013-11-21 DIAGNOSIS — K9423 Gastrostomy malfunction: Secondary | ICD-10-CM | POA: Insufficient documentation

## 2013-11-21 DIAGNOSIS — Z8669 Personal history of other diseases of the nervous system and sense organs: Secondary | ICD-10-CM | POA: Insufficient documentation

## 2013-11-21 DIAGNOSIS — I10 Essential (primary) hypertension: Secondary | ICD-10-CM | POA: Insufficient documentation

## 2013-11-21 DIAGNOSIS — G40909 Epilepsy, unspecified, not intractable, without status epilepticus: Secondary | ICD-10-CM | POA: Insufficient documentation

## 2013-11-21 DIAGNOSIS — Z79899 Other long term (current) drug therapy: Secondary | ICD-10-CM | POA: Insufficient documentation

## 2013-11-21 DIAGNOSIS — F411 Generalized anxiety disorder: Secondary | ICD-10-CM | POA: Insufficient documentation

## 2013-11-21 DIAGNOSIS — T85598A Other mechanical complication of other gastrointestinal prosthetic devices, implants and grafts, initial encounter: Secondary | ICD-10-CM

## 2013-11-21 NOTE — ED Notes (Signed)
Patient is a hospice patient with an NG tube. Hospice nurse tried to unclog Ng tube at home but was unsuccessful. PTAR unable to tell RN why patient has NG tube or why patient is on hospice care. PTAR unable to get vitals on patient d/t patient being contracted.

## 2013-11-21 NOTE — ED Notes (Signed)
Patient left with PTAR. 

## 2013-11-21 NOTE — ED Notes (Signed)
RN tried to unclog NG tube. Unsuccessful. RN removed NG. Tube and will replace.

## 2013-11-21 NOTE — ED Provider Notes (Signed)
CSN: [id number]     Arrival date & time 11/21/13  0014 History   First MD Initiated Contact with Patient 11/21/13 0016     Chief Complaint  Patient presents with  . 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 Patient is unable to provide any history due to her medical condition.  Level V caveat applies.     Past Medical History  Diagnosis Date  . Peripheral neuropathy   . Fibromyalgia   . Hypertension   . Anxiety   . Catatonia   . Seizures    Past Surgical History  Procedure Laterality Date  . Gastric bypass    . Abdominal hysterectomy    . Jejunostomy N/A 06/25/2013    Procedure:  OPEN JEJUNOSTOMY FEEDING TUBE ;  Surgeon: Cherylynn RidgesJames O [name], MD;  [facility];  Service: General;  Laterality: N/A;  . Laparotomy N/A 07/02/2013    Procedure: EXPLORATORY LAPAROTOMY with revision of jejunostomy feeding tube.;  Surgeon: Cherylynn RidgesJames O [name], MD;  [facility];  Service: General;  Laterality: N/A;  . Bowel resection N/A 07/02/2013    Procedure: SMALL BOWEL RESECTION;  Surgeon: Cherylynn RidgesJames O [name], MD;  [facility];  Service: General;  Laterality: N/A;   Family History  Problem Relation Age of Onset  . Hypertension Mother   . Diabetes Father    History  Substance Use Topics  . Smoking status: Never Smoker   . Smokeless tobacco: Not on file  . Alcohol Use: No   OB History   Grav Para Term Preterm Abortions TAB SAB Ect Mult Living                 Review of Systems  Unable to perform ROS: Other      Allergies  Penicillins; Prednisone; Citalopram; Clindamycin; Duloxetine hcl; Escitalopram; Hydromorphone; Morphine; Paroxetine; Sertraline; Sertraline hcl; Sulfamethoxazole-trimethoprim; Tape; Clarithromycin; Doxycycline; Metoclopramide; Morphine and related; and Paroxetine  hcl  Home Medications   Prior to Admission medications   Medication Sig Start Date End Date Taking? Authorizing Provider  acetaminophen (TYLENOL) 325 MG tablet 2 tablets (650 mg total) by Per J Tube route every 6 (six) hours as needed for mild pain (or Fever >/= 101). 07/30/13   Russella DarAllison L Ellis, NP  enoxaparin (LOVENOX) 40 MG/0.4ML injection Inject 40 mg into the skin daily.    Historical Provider, MD  fluconazole (DIFLUCAN) 200-0.9 MG/100ML-% IVPB Inject 100 mLs (200 mg total) into the vein daily. 08/27/13   Alison MurrayAlma M Devine, MD  lacosamide (VIMPAT) 200 MG TABS tablet Place 1 tablet (200 mg total) into feeding tube 2 (two) times daily. 08/27/13   Alison MurrayAlma M Devine, MD  levETIRAcetam (KEPPRA) 1000 MG tablet Place 1.5 tablets (1,500 mg total) into feeding tube 2 (two) times daily. 08/27/13   Alison MurrayAlma M Devine, MD  LORazepam (ATIVAN) 2 MG/ML injection Inject 0.25-0.5 mLs (0.5-1 mg total) into the vein every 6 (six) hours as needed for anxiety. 08/27/13   Alison MurrayAlma M Devine, MD  metoprolol (LOPRESSOR) 50 MG tablet Place 1 tablet (50 mg total) into feeding tube 2 (two) times daily. 08/27/13   Alison MurrayAlma M Devine, MD  mirtazapine (REMERON SOL-TAB) 15 MG disintegrating tablet 1 tablet (15 mg total) by Per J Tube route at bedtime. 07/30/13   Russella DarAllison L Ellis, NP  Nutritional Supplements (FEEDING SUPPLEMENT, VITAL AF 1.2  CAL,) LIQD Place 1,000 mLs into feeding tube continuous. 08/27/13   Alison MurrayAlma M Devine, MD  pantoprazole (PROTONIX) 40 MG injection Inject 40 mg into the vein every 12 (twelve) hours. 08/27/13   Alison MurrayAlma M Devine, MD  potassium chloride (KLOR-CON) 20 MEQ packet Take 20 mEq by mouth daily.    Historical Provider, MD  Valproic Acid (DEPAKENE) 250 MG/5ML SYRP syrup Take 15 mLs (750 mg total) by mouth every 8 (eight) hours. 08/27/13   Alison MurrayAlma M Devine, MD  Water For Irrigation, Sterile (FREE WATER) SOLN Place 200 mLs into feeding tube 4 (four) times daily. 08/27/13   Alison MurrayAlma M Devine, MD   BP 242/129  Pulse 118  Resp 12  SpO2  100% Physical Exam  Nursing note and vitals reviewed. Constitutional: She appears well-developed and well-nourished. No distress.  HENT:  Head: Normocephalic and atraumatic.  Mouth/Throat: Oropharynx is clear and moist.  NG feeding tube in place  Eyes: Conjunctivae are normal. Pupils are equal, round, and reactive to light. No scleral icterus.  Neck: Neck supple.  Cardiovascular: Regular rhythm, normal heart sounds and intact distal pulses.  Tachycardia present.   No murmur heard. Pulmonary/Chest: Effort normal and breath sounds normal. No stridor. No respiratory distress. She has no rales.  Abdominal: Soft. Bowel sounds are normal. She exhibits no distension. There is no tenderness.  Musculoskeletal: Normal range of motion.  Contracted extremities  Neurological: She is alert.  Skin: Skin is warm and dry. No rash noted.  Psychiatric:  Unable to test    ED Course  Procedures (including critical care time) Labs Review Labs Reviewed - No data to display  Imaging Review Dg Abd Portable 1v  11/21/2013   CLINICAL DATA:  Tube placement.  EXAM: PORTABLE ABDOMEN - 1 VIEW  COMPARISON:  10/17/2013  FINDINGS: Enteric tube tip is in the left upper quadrant consistent with location in the upper stomach. Visualized bowel gas pattern is normal. Surgical clips in the right upper quadrant.  IMPRESSION: Enteric tube tip is in the left upper quadrant consistent with location in the stomach.   Electronically Signed   By: Burman NievesWilliam  Stevens M.D.   On: 11/21/2013 03:22  All radiology studies independently viewed by me.      EKG Interpretation None      MDM   Final diagnoses:  Encounter for nasogastric (NG) tube placement  Feeding tube blocked, initial encounter    53 yo female on hospice with CJD presenting with NG tube malfunction.  History is very limited, but patient denies any other problems or complaints other than feeding tube.  It was replaced without difficulty.  Plan dc home.  Of note,  she is slightly tachycardic, which appears consistent or improved compared to prior encounters.      Candyce ChurnJohn David Wofford [name], MD 11/21/13 [phone]

## 2013-11-25 ENCOUNTER — Emergency Department (HOSPITAL_COMMUNITY)

## 2013-11-25 ENCOUNTER — Inpatient Hospital Stay (HOSPITAL_COMMUNITY)
Admission: EM | Admit: 2013-11-25 | Discharge: 2013-12-04 | DRG: 640 | Disposition: A | Discharge status: Hospice | Attending: Internal Medicine | Admitting: Internal Medicine

## 2013-11-25 ENCOUNTER — Encounter (HOSPITAL_COMMUNITY): Payer: Self-pay | Admitting: Emergency Medicine

## 2013-11-25 DIAGNOSIS — G9340 Encephalopathy, unspecified: Secondary | ICD-10-CM

## 2013-11-25 DIAGNOSIS — E872 Acidosis, unspecified: Secondary | ICD-10-CM

## 2013-11-25 DIAGNOSIS — G92 Toxic encephalopathy: Secondary | ICD-10-CM

## 2013-11-25 DIAGNOSIS — K9420 Gastrostomy complication, unspecified: Secondary | ICD-10-CM

## 2013-11-25 DIAGNOSIS — N390 Urinary tract infection, site not specified: Secondary | ICD-10-CM

## 2013-11-25 DIAGNOSIS — E870 Hyperosmolality and hypernatremia: Principal | ICD-10-CM

## 2013-11-25 DIAGNOSIS — R569 Unspecified convulsions: Secondary | ICD-10-CM

## 2013-11-25 DIAGNOSIS — E860 Dehydration: Secondary | ICD-10-CM

## 2013-11-25 DIAGNOSIS — Z885 Allergy status to narcotic agent status: Secondary | ICD-10-CM

## 2013-11-25 DIAGNOSIS — F411 Generalized anxiety disorder: Secondary | ICD-10-CM | POA: Diagnosis present

## 2013-11-25 DIAGNOSIS — N179 Acute kidney failure, unspecified: Secondary | ICD-10-CM

## 2013-11-25 DIAGNOSIS — R000 Tachycardia, unspecified: Secondary | ICD-10-CM

## 2013-11-25 DIAGNOSIS — I959 Hypotension, unspecified: Secondary | ICD-10-CM | POA: Diagnosis not present

## 2013-11-25 DIAGNOSIS — K9413 Enterostomy malfunction: Secondary | ICD-10-CM

## 2013-11-25 DIAGNOSIS — Z8249 Family history of ischemic heart disease and other diseases of the circulatory system: Secondary | ICD-10-CM

## 2013-11-25 DIAGNOSIS — A0472 Enterocolitis due to Clostridium difficile, not specified as recurrent: Secondary | ICD-10-CM | POA: Diagnosis present

## 2013-11-25 DIAGNOSIS — A048 Other specified bacterial intestinal infections: Secondary | ICD-10-CM

## 2013-11-25 DIAGNOSIS — D72829 Elevated white blood cell count, unspecified: Secondary | ICD-10-CM | POA: Diagnosis present

## 2013-11-25 DIAGNOSIS — Z833 Family history of diabetes mellitus: Secondary | ICD-10-CM

## 2013-11-25 DIAGNOSIS — R6510 Systemic inflammatory response syndrome (SIRS) of non-infectious origin without acute organ dysfunction: Secondary | ICD-10-CM

## 2013-11-25 DIAGNOSIS — G609 Hereditary and idiopathic neuropathy, unspecified: Secondary | ICD-10-CM

## 2013-11-25 DIAGNOSIS — I10 Essential (primary) hypertension: Secondary | ICD-10-CM

## 2013-11-25 DIAGNOSIS — G40909 Epilepsy, unspecified, not intractable, without status epilepticus: Secondary | ICD-10-CM | POA: Diagnosis present

## 2013-11-25 DIAGNOSIS — J189 Pneumonia, unspecified organism: Secondary | ICD-10-CM

## 2013-11-25 DIAGNOSIS — R509 Fever, unspecified: Secondary | ICD-10-CM | POA: Diagnosis present

## 2013-11-25 DIAGNOSIS — E876 Hypokalemia: Secondary | ICD-10-CM

## 2013-11-25 DIAGNOSIS — Z79899 Other long term (current) drug therapy: Secondary | ICD-10-CM

## 2013-11-25 DIAGNOSIS — G928 Other toxic encephalopathy: Secondary | ICD-10-CM

## 2013-11-25 DIAGNOSIS — Z888 Allergy status to other drugs, medicaments and biological substances status: Secondary | ICD-10-CM

## 2013-11-25 DIAGNOSIS — R29818 Other symptoms and signs involving the nervous system: Secondary | ICD-10-CM | POA: Diagnosis present

## 2013-11-25 DIAGNOSIS — Z9884 Bariatric surgery status: Secondary | ICD-10-CM

## 2013-11-25 DIAGNOSIS — Z9109 Other allergy status, other than to drugs and biological substances: Secondary | ICD-10-CM

## 2013-11-25 DIAGNOSIS — R1110 Vomiting, unspecified: Secondary | ICD-10-CM

## 2013-11-25 DIAGNOSIS — Z515 Encounter for palliative care: Secondary | ICD-10-CM

## 2013-11-25 DIAGNOSIS — Z9049 Acquired absence of other specified parts of digestive tract: Secondary | ICD-10-CM

## 2013-11-25 DIAGNOSIS — A8100 Creutzfeldt-Jakob disease, unspecified: Secondary | ICD-10-CM | POA: Diagnosis present

## 2013-11-25 DIAGNOSIS — R197 Diarrhea, unspecified: Secondary | ICD-10-CM

## 2013-11-25 DIAGNOSIS — Z880 Allergy status to penicillin: Secondary | ICD-10-CM

## 2013-11-25 DIAGNOSIS — K9423 Gastrostomy malfunction: Secondary | ICD-10-CM

## 2013-11-25 DIAGNOSIS — IMO0001 Reserved for inherently not codable concepts without codable children: Secondary | ICD-10-CM | POA: Diagnosis present

## 2013-11-25 LAB — BASIC METABOLIC PANEL
BUN: 42 mg/dL — ABNORMAL HIGH (ref 6–23)
CO2: 28 mEq/L (ref 19–32)
Calcium: 9.7 mg/dL (ref 8.4–10.5)
Chloride: 113 mEq/L — ABNORMAL HIGH (ref 96–112)
Creatinine, Ser: 0.51 mg/dL (ref 0.50–1.10)
GFR calc non Af Amer: >90 mL/min (ref >90)
GLUCOSE: 110 mg/dL — AB (ref 70–99)
POTASSIUM: 4.1 meq/L (ref 3.7–5.3)
SODIUM: 156 meq/L — AB (ref 137–147)

## 2013-11-25 LAB — URINALYSIS, ROUTINE W REFLEX MICROSCOPIC
BILIRUBIN URINE: NEGATIVE
Glucose, UA: NEGATIVE mg/dL
HGB URINE DIPSTICK: NEGATIVE
Ketones, ur: NEGATIVE mg/dL
Leukocytes, UA: NEGATIVE
Nitrite: POSITIVE — AB
PH: 8 (ref 5.0–8.0)
Protein, ur: NEGATIVE mg/dL
SPECIFIC GRAVITY, URINE: 1.021 (ref 1.005–1.030)
Urobilinogen, UA: 1 mg/dL (ref 0.0–1.0)

## 2013-11-25 LAB — GLUCOSE, CAPILLARY
GLUCOSE-CAPILLARY: 112 mg/dL — AB (ref 70–99)
Glucose-Capillary: 134 mg/dL — ABNORMAL HIGH (ref 70–99)
Glucose-Capillary: 109 mg/dL — ABNORMAL HIGH (ref 70–99)

## 2013-11-25 LAB — CBC WITH DIFFERENTIAL/PLATELET
BASOS PCT: 0 % (ref 0–1)
Basophils Absolute: 0 10*3/uL (ref 0.0–0.1)
EOS ABS: 0 10*3/uL (ref 0.0–0.7)
Eosinophils Relative: 0 % (ref 0–5)
HCT: 46.4 % — ABNORMAL HIGH (ref 36.0–46.0)
Hemoglobin: 14.5 g/dL (ref 12.0–15.0)
LYMPHS ABS: 1.2 10*3/uL (ref 0.7–4.0)
Lymphocytes Relative: 10 % — ABNORMAL LOW (ref 12–46)
MCH: 30.9 pg (ref 26.0–34.0)
MCHC: 31.3 g/dL (ref 30.0–36.0)
MCV: 98.7 fL (ref 78.0–100.0)
Monocytes Absolute: 0.8 10*3/uL (ref 0.1–1.0)
Monocytes Relative: 7 % (ref 3–12)
Neutro Abs: 9.3 10*3/uL — ABNORMAL HIGH (ref 1.7–7.7)
Neutrophils Relative %: 83 % — ABNORMAL HIGH (ref 43–77)
Platelets: 121 10*3/uL — ABNORMAL LOW (ref 150–400)
RBC: 4.7 MIL/uL (ref 3.87–5.11)
RDW: 14.4 % (ref 11.5–15.5)
WBC: 11.2 10*3/uL — ABNORMAL HIGH (ref 4.0–10.5)

## 2013-11-25 LAB — URINE MICROSCOPIC-ADD ON

## 2013-11-25 LAB — PHOSPHORUS: Phosphorus: 4.1 mg/dL (ref 2.3–4.6)

## 2013-11-25 LAB — MAGNESIUM: Magnesium: 2 mg/dL (ref 1.5–2.5)

## 2013-11-25 MED ORDER — CHLORHEXIDINE GLUCONATE 0.12 % MT SOLN
15.0000 mL | Freq: Two times a day (BID) | OROMUCOSAL | Status: DC
  Administered 2013-11-25 – 2013-12-03 (×14): 15 mL via OROMUCOSAL
  Filled 2013-11-25 (×20): qty 15

## 2013-11-25 MED ORDER — SODIUM CHLORIDE 0.9 % IV SOLN
Freq: Once | INTRAVENOUS | Status: AC
  Administered 2013-11-25: 07:00:00 via INTRAVENOUS

## 2013-11-25 MED ORDER — FREE WATER
250.0000 mL | Freq: Four times a day (QID) | Status: DC
  Administered 2013-11-25 – 2013-12-04 (×35): 250 mL

## 2013-11-25 MED ORDER — MIRTAZAPINE 15 MG PO TBDP
15.0000 mg | ORAL_TABLET | Freq: Every day | ORAL | Status: DC
  Administered 2013-11-25 – 2013-12-03 (×9): 15 mg via JEJUNOSTOMY
  Filled 2013-11-25 (×10): qty 1

## 2013-11-25 MED ORDER — DEXTROSE 5 % IV SOLN
INTRAVENOUS | Status: DC
  Administered 2013-11-25: 1000 mL via INTRAVENOUS
  Administered 2013-11-27: 50 mL via INTRAVENOUS
  Administered 2013-11-27 – 2013-12-01 (×4): via INTRAVENOUS
  Administered 2013-12-02: 50 mL via INTRAVENOUS

## 2013-11-25 MED ORDER — METOPROLOL TARTRATE 25 MG/10 ML ORAL SUSPENSION
50.0000 mg | Freq: Two times a day (BID) | ORAL | Status: DC
  Administered 2013-11-25 – 2013-12-04 (×18): 50 mg
  Filled 2013-11-25 (×23): qty 20

## 2013-11-25 MED ORDER — ACETAMINOPHEN 325 MG PO TABS: 650.0000 mg | ORAL_TABLET | Freq: Four times a day (QID) | ORAL | Status: DC | PRN

## 2013-11-25 MED ORDER — VITAL AF 1.2 CAL PO LIQD
1000.0000 mL | ORAL | Status: DC
  Administered 2013-11-25 – 2013-11-26 (×2): 1000 mL
  Filled 2013-11-25 (×4): qty 1000

## 2013-11-25 MED ORDER — SODIUM CHLORIDE 0.9 % IV SOLN: INTRAVENOUS | Status: DC

## 2013-11-25 MED ORDER — FREE WATER: 200.0000 mL | Freq: Four times a day (QID) | Status: DC

## 2013-11-25 MED ORDER — LEVETIRACETAM 100 MG/ML PO SOLN
1500.0000 mg | Freq: Two times a day (BID) | ORAL | Status: DC
  Administered 2013-11-25 – 2013-12-04 (×19): 1500 mg
  Filled 2013-11-25 (×22): qty 15

## 2013-11-25 MED ORDER — LORAZEPAM 2 MG/ML IJ SOLN
0.5000 mg | Freq: Four times a day (QID) | INTRAMUSCULAR | Status: DC | PRN
  Administered 2013-11-25 – 2013-12-04 (×11): 0.5 mg via INTRAVENOUS
  Filled 2013-11-25 (×11): qty 1

## 2013-11-25 MED ORDER — BIOTENE DRY MOUTH MT LIQD
15.0000 mL | Freq: Two times a day (BID) | OROMUCOSAL | Status: DC
Start: 2013-11-25 — End: 2013-12-04
  Administered 2013-11-25 – 2013-12-03 (×14): 15 mL via OROMUCOSAL

## 2013-11-25 MED ORDER — VITAL AF 1.2 CAL PO LIQD
1000.0000 mL | ORAL | Status: DC
  Filled 2013-11-25: qty 1000

## 2013-11-25 MED ORDER — LEVETIRACETAM 750 MG PO TABS: 1500.0000 mg | ORAL_TABLET | Freq: Two times a day (BID) | ORAL | Status: DC

## 2013-11-25 MED ORDER — SODIUM CHLORIDE 0.9 % IJ SOLN
3.0000 mL | Freq: Two times a day (BID) | INTRAMUSCULAR | Status: DC
  Administered 2013-11-25 – 2013-12-01 (×2): 3 mL via INTRAVENOUS

## 2013-11-25 MED ORDER — ACETAMINOPHEN 160 MG/5ML PO SOLN
650.0000 mg | Freq: Four times a day (QID) | ORAL | Status: DC | PRN
  Administered 2013-11-25 – 2013-12-04 (×13): 650 mg
  Filled 2013-11-25 (×14): qty 20.3

## 2013-11-25 MED ORDER — ENOXAPARIN SODIUM 40 MG/0.4ML ~~LOC~~ SOLN
40.0000 mg | SUBCUTANEOUS | Status: DC
  Administered 2013-11-25 – 2013-12-03 (×9): 40 mg via SUBCUTANEOUS
  Filled 2013-11-25 (×10): qty 0.4

## 2013-11-25 MED ORDER — PANTOPRAZOLE SODIUM 40 MG IV SOLR
40.0000 mg | Freq: Two times a day (BID) | INTRAVENOUS | Status: DC
  Administered 2013-11-25 – 2013-12-01 (×14): 40 mg via INTRAVENOUS
  Filled 2013-11-25 (×17): qty 40

## 2013-11-25 MED ORDER — LACOSAMIDE 50 MG PO TABS
200.0000 mg | ORAL_TABLET | Freq: Two times a day (BID) | ORAL | Status: DC
  Administered 2013-11-25 – 2013-12-04 (×18): 200 mg
  Filled 2013-11-25 (×20): qty 4

## 2013-11-25 MED ORDER — VALPROIC ACID 250 MG/5ML PO SYRP
750.0000 mg | ORAL_SOLUTION | Freq: Three times a day (TID) | ORAL | Status: DC
  Administered 2013-11-25 – 2013-12-04 (×28): 750 mg via ORAL
  Filled 2013-11-25 (×30): qty 15

## 2013-11-25 MED ORDER — DEXTROSE 5 % IV SOLN: INTRAVENOUS | Status: DC

## 2013-11-25 MED ORDER — METOPROLOL TARTRATE 50 MG PO TABS
50.0000 mg | ORAL_TABLET | Freq: Two times a day (BID) | ORAL | Status: DC
  Administered 2013-11-25: 50 mg
  Filled 2013-11-25 (×2): qty 1

## 2013-11-25 NOTE — Progress Notes (Signed)
INITIAL NUTRITION ASSESSMENT  DOCUMENTATION CODES Per approved criteria  -Not Applicable   INTERVENTION: -Initiate Vital AF 1.2 @ 20 ml/hr via NGT and increase by 10 ml every 4 hours to goal rate of 60 ml/hr.   -Tube feeding regimen provides 1728 kcal (100% of needs), 108 grams of protein, and 1168 ml of H2O.  -Recommended to increase flushes to 250 ml QID d/t dehydration -Adult enteral protocol ordered  NUTRITION DIAGNOSIS: Inadequate oral intake related to inability to eat as evidenced by NPO status.   Goal: Pt to meet >/= 90% of their estimated nutrition needs    Monitor:  TF tolerance, total protein/energy intake, GI profile, labs, weights  Reason for Assessment: TF Consult  53 y.o. female  Admitting Dx: Hypernatremia  ASSESSMENT: [name] is a [name] With history of seizure disorder, CJD per EMR notes, history of multiple displaced feeding tubes and NG tube (reportedly per notes patient not candidate for feeding tube placement), history of strokes, hypertension.  -Per previous medical records, pt receiving Vital AF 1.2 at 60 ml/hr with 200 ml flush QID. -Per RN, pt with some episodes of nausea/vomiting pta. Will start at lower rate and advance to goal. RD to assess tolerance -Increased flushes to 250 ml QID d/t pt's hypernatremia. Receiving IVFs -Per previous records, pt weighed 153 lbs three months ago, indicating a possible 7 lbs weight loss (4.5% body weight loss, non severe time frame). Dehydration may contribute to decrease in weight. -No signs of muscle wasting or subcutaneous fat loss in nutrition focused physical exam -GOC pending d/t progressive decline -Has NGT replaced, TF to start at 12 PM  Height: Ht Readings from Last 1 Encounters:  08/23/13 5\' 3"  (1.6 m)    Weight: Wt Readings from Last 1 Encounters:  11/25/13 146 lb 2.6 oz (66.3 kg)    Ideal Body Weight: 115 lbs  % Ideal Body Weight: 127%  Wt Readings from Last 10 Encounters:   11/25/13 146 lb 2.6 oz (66.3 kg)  08/27/13 153 lb 11.2 oz (69.718 kg)  07/30/13 181 lb 14.1 oz (82.5 kg)  07/24/13 170 lb 11.2 oz (77.429 kg)  07/16/13 182 lb 12.2 oz (82.9 kg)  07/16/13 182 lb 12.2 oz (82.9 kg)  07/16/13 182 lb 12.2 oz (82.9 kg)  05/17/13 154 lb 12.2 oz (70.2 kg)    Usual Body Weight: approximately 153 lbs per previous medical records  % Usual Body Weight: 95%  BMI:  Body mass index is 25.9 kg/(m^2).  Estimated Nutritional Needs: Kcal: 1700-1900 Protein: 85-100 gram Fluid: 2100-2300 ml/daily  Skin: WDL  Diet Order:    EDUCATION NEEDS: -Education needs addressed  No intake or output data in the 24 hours ending 11/25/13 1256  Last BM: pta   Labs:   Recent Labs Lab 11/25/13 0604 11/25/13 1030  NA 156*  --   K 4.1  --   CL 113*  --   CO2 28  --   BUN 42*  --   CREATININE 0.51  --   CALCIUM 9.7  --   MG  --  2.0  PHOS  --  4.1  GLUCOSE 110*  --     CBG (last 3)   Recent Labs  11/25/13 1202  GLUCAP 134*    Scheduled Meds: . enoxaparin  40 mg Subcutaneous Q24H  . feeding supplement (VITAL AF 1.2 CAL)  1,000 mL Per Tube Q24H  . free water  250 mL Per Tube QID  . lacosamide  200  mg Per Tube BID  . levETIRAcetam  1,500 mg Per Tube BID  . metoprolol  50 mg Per Tube BID  . mirtazapine  15 mg Per J Tube QHS  . pantoprazole  40 mg Intravenous Q12H  . sodium chloride  3 mL Intravenous Q12H  . Valproic Acid  750 mg Oral 3 times per day    Continuous Infusions: . dextrose 1,000 mL (11/25/13 [id number])    Past Medical History  Diagnosis Date  . Peripheral neuropathy   . Fibromyalgia   . Hypertension   . Anxiety   . Catatonia   . Seizures     Past Surgical History  Procedure Laterality Date  . Gastric bypass    . Abdominal hysterectomy    . Jejunostomy N/A 06/25/2013    Procedure:  OPEN JEJUNOSTOMY FEEDING TUBE ;  Surgeon: Cherylynn RidgesJames O [name], MD;  [facility];  Service: General;  Laterality: N/A;  . Laparotomy N/A 07/02/2013     Procedure: EXPLORATORY LAPAROTOMY with revision of jejunostomy feeding tube.;  Surgeon: Cherylynn RidgesJames O [name], MD;  [facility];  Service: General;  Laterality: N/A;  . Bowel resection N/A 07/02/2013    Procedure: SMALL BOWEL RESECTION;  Surgeon: Cherylynn RidgesJames O [name], MD;  [facility];  Service: General;  Laterality: N/A;    Lloyd HugerSarah F Beaty MS RD LDN Clinical Dietitian Pager:[phone]

## 2013-11-25 NOTE — ED Notes (Signed)
Per Ptar pt son states pt was vomiting feeding up through her mouth so he disconnected feeding tube and tube became dislodged.[facility] Pt is seen by hospice nurse who could not correct problem. Pt son did not come with pt. Pt needs feeding tube replaced. Pt is aphasic and contracted.

## 2013-11-25 NOTE — ED Provider Notes (Addendum)
CSN: [id number]     Arrival date & time 11/25/13  0242 History   First MD Initiated Contact with Patient 11/25/13 0410     Chief Complaint  Patient presents with  . Feeding tube replacement      (Consider location/radiation/quality/duration/timing/severity/associated sxs/prior Treatment) HPI 53 year old female presents to the emergency department via EMS from home with complaint of dislodged feeding tube.  Patient is reportedly on hospice care, unclear reason.  Tonight patient had vomiting of feedings, so patient's son who is her only caregiver disconnected the feeding tube and he felt like the NG tube became dislodged.  [facility]  Patient seen for same problem 4 days ago and one month ago.  Per notes, patient is not a candidate for a PEG tube.  EMS reported concerns to nursing staff that patient's son is not able to care for patient appropriately.  No reported elder abuse, but they felt like she needed a higher level of care.  They report hospice coming in twice a week to help with care, but no day to day care available.  Patient had been in long-term facility until about a week ago when her son brought her home.  It is unclear at this time what caused this change.  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  Patient does not appear to be interactive at this time.  She does not respond to verbal or physical stimuli. Past Medical History  Diagnosis Date  . Peripheral neuropathy   . Fibromyalgia   . Hypertension   . Anxiety   . Catatonia   . Seizures    Past Surgical History  Procedure Laterality Date  . Gastric bypass    . Abdominal hysterectomy    . Jejunostomy N/A 06/25/2013    Procedure:  OPEN JEJUNOSTOMY FEEDING TUBE ;  Surgeon: Cherylynn Ridges, MD;  Location: [facility] OR;  Service: General;  Laterality: N/A;  . Laparotomy N/A 07/02/2013    Procedure: EXPLORATORY LAPAROTOMY with revision of jejunostomy feeding tube.;  Surgeon: Cherylynn Ridges, MD;   [facility];  Service: General;  Laterality: N/A;  . Bowel resection N/A 07/02/2013    Procedure: SMALL BOWEL RESECTION;  Surgeon: Cherylynn Ridges, MD;  Location: [facility] OR;  Service: General;  Laterality: N/A;   Family History  Problem Relation Age of Onset  . Hypertension Mother   . Diabetes Father    History  Substance Use Topics  . Smoking status: Never Smoker   . Smokeless tobacco: Not on file  . Alcohol Use: No   OB History   Grav Para Term Preterm Abortions TAB SAB Ect Mult Living                 Review of Systems  Unable to perform ROS: Patient nonverbal      Allergies  Penicillins; Prednisone; Citalopram; Clindamycin; Duloxetine hcl; Escitalopram; Hydromorphone; Morphine; Paroxetine; Sertraline; Sertraline hcl; Sulfamethoxazole-trimethoprim; Tape; Clarithromycin; Doxycycline; Metoclopramide; Morphine and related; and Paroxetine hcl  Home Medications   Prior to Admission medications   Medication Sig Start Date End Date Taking? Authorizing Provider  acetaminophen (TYLENOL) 325 MG tablet 2 tablets (650 mg total) by Per J Tube route every 6 (six) hours as needed for mild pain (or Fever >/= 101). 07/30/13   Russella Dar, NP  enoxaparin (LOVENOX) 40 MG/0.4ML injection Inject 40 mg into the skin daily.    Historical Provider, MD  fluconazole (DIFLUCAN) 200-0.9 MG/100ML-% IVPB Inject 100 mLs (200  mg total) into the vein daily. 08/27/13   Alison MurrayAlma M Devine, MD  lacosamide (VIMPAT) 200 MG TABS tablet Place 1 tablet (200 mg total) into feeding tube 2 (two) times daily. 08/27/13   Alison MurrayAlma M Devine, MD  levETIRAcetam (KEPPRA) 1000 MG tablet Place 1.5 tablets (1,500 mg total) into feeding tube 2 (two) times daily. 08/27/13   Alison MurrayAlma M Devine, MD  LORazepam (ATIVAN) 2 MG/ML injection Inject 0.25-0.5 mLs (0.5-1 mg total) into the vein every 6 (six) hours as needed for anxiety. 08/27/13   Alison MurrayAlma M Devine, MD  metoprolol (LOPRESSOR) 50 MG tablet Place 1 tablet (50 mg total) into feeding tube 2 (two) times  daily. 08/27/13   Alison MurrayAlma M Devine, MD  mirtazapine (REMERON SOL-TAB) 15 MG disintegrating tablet 1 tablet (15 mg total) by Per J Tube route at bedtime. 07/30/13   Russella DarAllison L Ellis, NP  Nutritional Supplements (FEEDING SUPPLEMENT, VITAL AF 1.2 CAL,) LIQD Place 1,000 mLs into feeding tube continuous. 08/27/13   Alison MurrayAlma M Devine, MD  pantoprazole (PROTONIX) 40 MG injection Inject 40 mg into the vein every 12 (twelve) hours. 08/27/13   Alison MurrayAlma M Devine, MD  potassium chloride (KLOR-CON) 20 MEQ packet Take 20 mEq by mouth daily.    Historical Provider, MD  Valproic Acid (DEPAKENE) 250 MG/5ML SYRP syrup Take 15 mLs (750 mg total) by mouth every 8 (eight) hours. 08/27/13   Alison MurrayAlma M Devine, MD  Water For Irrigation, Sterile (FREE WATER) SOLN Place 200 mLs into feeding tube 4 (four) times daily. 08/27/13   Alison MurrayAlma M Devine, MD   BP 137/77  Pulse 108  Temp(Src) 100.2 F (37.9 C) (Rectal)  Resp 20  SpO2 96% Physical Exam  Nursing note and vitals reviewed. Constitutional:  Chronically ill-appearing  HENT:  Head: Normocephalic and atraumatic.  Right Ear: External ear normal.  Left Ear: External ear normal.  Nose: Nose normal.  Dry mucous membranes.  NG tube in place in right nostril.  No signs of skin breakdown at this time  Eyes: Conjunctivae are normal. Pupils are equal, round, and reactive to light.  Neck: Normal range of motion. Neck supple. No JVD present. No tracheal deviation present. No thyromegaly present.  Cardiovascular: Regular rhythm, normal heart sounds and intact distal pulses.  Exam reveals no gallop and no friction rub.   No murmur heard. Tachycardia  Pulmonary/Chest: Effort normal and breath sounds normal. No stridor. No respiratory distress. She has no wheezes. She has no rales. She exhibits no tenderness.  Abdominal: Soft. Bowel sounds are normal. She exhibits no distension and no mass. There is no tenderness. There is no rebound and no guarding.  Musculoskeletal: Normal range of motion. She  exhibits no edema and no tenderness.  Lymphadenopathy:    She has no cervical adenopathy.  Neurological: She exhibits abnormal muscle tone.  Patient is unresponsive to both verbal and physical stimuli.  She has contractures to arms and legs.  Skin: Skin is warm and dry. No rash noted. No erythema. No pallor.    ED Course  Procedures (including critical care time) Labs Review Labs Reviewed  URINE CULTURE  CBC WITH DIFFERENTIAL  BASIC METABOLIC PANEL  URINALYSIS, ROUTINE W REFLEX MICROSCOPIC    Imaging Review Dg Chest 1 View  11/25/2013   CLINICAL DATA:  Fever, nasogastric tube.  EXAM: CHEST - 1 VIEW  COMPARISON:  Chest radiograph July 30, 2013  FINDINGS: Cardiomediastinal silhouette is unremarkable. The lungs are clear without pleural effusions or focal consolidations. Trachea projects midline and there  is no pneumothorax. Lung apices obscured by facial structures.  Nasogastric tube tip projects in proximal stomach. Moderate degenerative change of the thoracic spine. Surgical clips in the right abdomen may reflect cholecystectomy.  IMPRESSION: No definite acute cardiopulmonary process of the lung apices obscured by facial structures.   Electronically Signed   By: Awilda Metroourtnay  Bloomer   On: 11/25/2013 06:01   Dg Abd 1 View  11/25/2013   CLINICAL DATA:  Fever and nasogastric tube placement.  EXAM: ABDOMEN - 1 VIEW  COMPARISON:  Abdominal radiograph November 25, 2013  FINDINGS: Nasogastric tube tip projects in proximal stomach. Bowel gas pattern is nondilated and nonobstructive. Surgical clips in the right upper quadrant likely reflect cholecystectomy. Soft tissue planes and included osseous structures are nonsuspicious.  IMPRESSION: Nasogastric tube tip projects in proximal stomach.  Nonspecific bowel gas pattern, partially imaged.   Electronically Signed   By: Awilda Metroourtnay  Bloomer   On: 11/25/2013 06:02   Dg Abd 1 View  11/25/2013   CLINICAL DATA:  Possible NG tube displacement  EXAM: ABDOMEN - 1 VIEW   COMPARISON:  11/21/2013  FINDINGS: The nasogastric tube is no longer visible.  Bowel sutures noted over the epigastrium and left abdomen. Linear densities over the left abdomen likely reflect overlapping bedding. Bowel gas pattern is nonobstructive. Lung bases are clear.  IMPRESSION: The nasogastric tube is no longer visible in the abdomen.   Electronically Signed   By: Tiburcio PeaJonathan  Watts M.D.   On: 11/25/2013 04:04     EKG Interpretation None      MDM   Final diagnoses:  Hypernatremia  Dehydration  End of life care    53 year old female with possible dislodged NG tube.  Initial x-ray does not show tube in the stomach, it has been advanced and re- taped, adequate positioning.  Patient noted to have a low-grade fever.  We'll check labs.  Patient will need social work consult possible palliative care consult for end-of-life goals as a chronic indwelling NG tube is not ideal.    Olivia Mackielga M Otter, MD 11/25/13 [phone]  7:04 AM Attempted to call son x 2, no answer.    7:36 AM Patient with significant hypernatremia, hyperchloremia, dehydration.  Discuss with hospitalist for office visit, will need palliative care, and social work consult to help establish end-of-life goals  Olivia Mackielga M Otter, MD 11/25/13 [phone]

## 2013-11-25 NOTE — H&P (Signed)
[facility] History and Physical  Lorree Millar ZOX:[id number] DOB: [birth year] DOA: 11/25/2013  Referring physician: Dr. Norlene Campbell PCP: [name], MD   Chief Complaint: Dehydration, hypernatremia  HPI: Joan [name] is a 53 y.o. female  With history of seizure disorder, CJD per EMR notes, history of multiple displaced feeding tubes and NG tube (reportedly per notes patient not candidate for feeding tube placement), history of strokes, hypertension. Presenting to the ED after NG tube got dislodged. Most of the history is obtained from EMR and and ER physician as patient is unable to provide history given her limited responses based on her past medical history listed above. Son reportedly is caregiver but not available via phone or at bedside for further questioning regarding patient's history.  While in the ED patient was found to have elevated sodium levels and lab work was consistent with dehydration. We were consulted for further admission evaluation and recommendations as well as discharge planning given that patient may require a higher level of care as an outpatient.   Review of Systems:  Unable to assess secondary to no response from patient as patient has not responded to both or tactile stimuli. Given history of CJD and seizures I suspect this may be baseline  Past Medical History  Diagnosis Date  . Peripheral neuropathy   . Fibromyalgia   . Hypertension   . Anxiety   . Catatonia   . Seizures    Past Surgical History  Procedure Laterality Date  . Gastric bypass    . Abdominal hysterectomy    . Jejunostomy N/A 06/25/2013    Procedure:  OPEN JEJUNOSTOMY FEEDING TUBE ;  Surgeon: Cherylynn Ridges, MD;  Location: [facility] OR;  Service: General;  Laterality: N/A;  . Laparotomy N/A 07/02/2013    Procedure: EXPLORATORY LAPAROTOMY with revision of jejunostomy feeding tube.;  Surgeon: Cherylynn Ridges, MD;  Location: [facility] OR;  Service: General;  Laterality: N/A;  . Bowel resection N/A 07/02/2013      Procedure: SMALL BOWEL RESECTION;  Surgeon: Cherylynn Ridges, MD;  Location: [facility] OR;  Service: General;  Laterality: N/A;   Social History:  reports that she has never smoked. She does not have any smokeless tobacco history on file. She reports that she does not drink alcohol. Her drug history is not on file.  Allergies  Allergen Reactions  . Penicillins Other (See Comments), Rash and Shortness Of Breath    REACTION: Makes her body hurt.  . Prednisone Palpitations  . Citalopram Nausea And Vomiting  . Clindamycin Rash and Other (See Comments)    GI upset  . Duloxetine Hcl Other (See Comments)    GI upset   . Escitalopram Nausea And Vomiting  . Hydromorphone Other (See Comments)    Generalized severe pain  . Morphine Other (See Comments)    Generalized severe pain  . Paroxetine Other (See Comments)    GI upset  . Sertraline Other (See Comments)    GI upset  . Sertraline Hcl Other (See Comments)    REACTION:  unknown  . Sulfamethoxazole-Trimethoprim Nausea And Vomiting  . Tape Dermatitis  . Clarithromycin Rash  . Doxycycline Rash and Other (See Comments)    GI upset  . Metoclopramide Anxiety  . Morphine And Related Other (See Comments)    REACTION: Causes pain  . Paroxetine Hcl Other (See Comments)    REACTION:  unknown    Family History  Problem Relation Age of Onset  . Hypertension Mother   . Diabetes Father  Prior to Admission medications   Medication Sig Start Date End Date Taking? Authorizing Fayez Sturgell  acetaminophen (TYLENOL) 325 MG tablet 2 tablets (650 mg total) by Per J Tube route every 6 (six) hours as needed for mild pain (or Fever >/= 101). 07/30/13   Russella DarAllison L Ellis, NP  enoxaparin (LOVENOX) 40 MG/0.4ML injection Inject 40 mg into the skin daily.    Historical Lyndsey Demos, MD  fluconazole (DIFLUCAN) 200-0.9 MG/100ML-% IVPB Inject 100 mLs (200 mg total) into the vein daily. 08/27/13   Alison MurrayAlma M Devine, MD  lacosamide (VIMPAT) 200 MG TABS tablet Place 1 tablet (200  mg total) into feeding tube 2 (two) times daily. 08/27/13   Alison MurrayAlma M Devine, MD  levETIRAcetam (KEPPRA) 1000 MG tablet Place 1.5 tablets (1,500 mg total) into feeding tube 2 (two) times daily. 08/27/13   Alison MurrayAlma M Devine, MD  LORazepam (ATIVAN) 2 MG/ML injection Inject 0.25-0.5 mLs (0.5-1 mg total) into the vein every 6 (six) hours as needed for anxiety. 08/27/13   Alison MurrayAlma M Devine, MD  metoprolol (LOPRESSOR) 50 MG tablet Place 1 tablet (50 mg total) into feeding tube 2 (two) times daily. 08/27/13   Alison MurrayAlma M Devine, MD  mirtazapine (REMERON SOL-TAB) 15 MG disintegrating tablet 1 tablet (15 mg total) by Per J Tube route at bedtime. 07/30/13   Russella DarAllison L Ellis, NP  Nutritional Supplements (FEEDING SUPPLEMENT, VITAL AF 1.2 CAL,) LIQD Place 1,000 mLs into feeding tube continuous. 08/27/13   Alison MurrayAlma M Devine, MD  pantoprazole (PROTONIX) 40 MG injection Inject 40 mg into the vein every 12 (twelve) hours. 08/27/13   Alison MurrayAlma M Devine, MD  potassium chloride (KLOR-CON) 20 MEQ packet Take 20 mEq by mouth daily.    Historical Shyteria Lewis, MD  Valproic Acid (DEPAKENE) 250 MG/5ML SYRP syrup Take 15 mLs (750 mg total) by mouth every 8 (eight) hours. 08/27/13   Alison MurrayAlma M Devine, MD  Water For Irrigation, Sterile (FREE WATER) SOLN Place 200 mLs into feeding tube 4 (four) times daily. 08/27/13   Alison MurrayAlma M Devine, MD   Physical Exam: Filed Vitals:   11/25/13 0519  BP: 137/77  Pulse: 108  Temp: 100.2 F (37.9 C)  Resp:     BP 137/77  Pulse 108  Temp(Src) 100.2 F (37.9 C) (Rectal)  Resp 20  SpO2 96%  General:  Awake and alert but does not respond to verbal or tactile stimuli Eyes: PERRL, normal lids, irises & conjunctiva ENT: Normal exterior appearance, NG tube in place Neck: no  masses or thyromegaly on visual examination Cardiovascular: RRR, no m/r/g. No LE edema. Respiratory: CTA bilaterally, no w/r/r. Normal respiratory effort. Abdomen: soft, nt, nd Skin: no rash or induration seen on limited exam Musculoskeletal: grossly normal  tone BUE/BLE Psychiatric: Unable to assess properly secondary to limited response to examiner Neurologic: Patient has no facial asymmetry, does not respond to tactile or verbal stimuli           Labs on Admission:  Basic Metabolic Panel:  Recent Labs Lab 11/25/13 0604  NA 156*  K 4.1  CL 113*  CO2 28  GLUCOSE 110*  BUN 42*  CREATININE 0.51  CALCIUM 9.7   Liver Function Tests: No results found for this basename: AST, ALT, ALKPHOS, BILITOT, PROT, ALBUMIN,  in the last 168 hours No results found for this basename: LIPASE, AMYLASE,  in the last 168 hours No results found for this basename: AMMONIA,  in the last 168 hours CBC:  Recent Labs Lab 11/25/13 0604  WBC 11.2*  NEUTROABS 9.3*  HGB 14.5  HCT 46.4*  MCV 98.7  PLT 121*   Cardiac Enzymes: No results found for this basename: CKTOTAL, CKMB, CKMBINDEX, TROPONINI,  in the last 168 hours  BNP (last 3 results) No results found for this basename: PROBNP,  in the last 8760 hours CBG: No results found for this basename: GLUCAP,  in the last 168 hours  Radiological Exams on Admission: Dg Chest 1 View  11/25/2013   CLINICAL DATA:  Fever, nasogastric tube.  EXAM: CHEST - 1 VIEW  COMPARISON:  Chest radiograph July 30, 2013  FINDINGS: Cardiomediastinal silhouette is unremarkable. The lungs are clear without pleural effusions or focal consolidations. Trachea projects midline and there is no pneumothorax. Lung apices obscured by facial structures.  Nasogastric tube tip projects in proximal stomach. Moderate degenerative change of the thoracic spine. Surgical clips in the right abdomen may reflect cholecystectomy.  IMPRESSION: No definite acute cardiopulmonary process of the lung apices obscured by facial structures.   Electronically Signed   By: Awilda Metroourtnay  Bloomer   On: 11/25/2013 06:01   Dg Abd 1 View  11/25/2013   CLINICAL DATA:  Fever and nasogastric tube placement.  EXAM: ABDOMEN - 1 VIEW  COMPARISON:  Abdominal radiograph November 25, 2013  FINDINGS: Nasogastric tube tip projects in proximal stomach. Bowel gas pattern is nondilated and nonobstructive. Surgical clips in the right upper quadrant likely reflect cholecystectomy. Soft tissue planes and included osseous structures are nonsuspicious.  IMPRESSION: Nasogastric tube tip projects in proximal stomach.  Nonspecific bowel gas pattern, partially imaged.   Electronically Signed   By: Awilda Metroourtnay  Bloomer   On: 11/25/2013 06:02   Dg Abd 1 View  11/25/2013   CLINICAL DATA:  Possible NG tube displacement  EXAM: ABDOMEN - 1 VIEW  COMPARISON:  11/21/2013  FINDINGS: The nasogastric tube is no longer visible.  Bowel sutures noted over the epigastrium and left abdomen. Linear densities over the left abdomen likely reflect overlapping bedding. Bowel gas pattern is nonobstructive. Lung bases are clear.  IMPRESSION: The nasogastric tube is no longer visible in the abdomen.   Electronically Signed   By: Tiburcio PeaJonathan  Watts M.D.   On: 11/25/2013 04:04     Assessment/Plan Principal Problem:   Hypernatremia - IV fluids we'll place on D5 W. - Monitor intake and output  Active Problems:   Dehydration - Given IVF's initially - continue tube feeds and dietitian consulted -Place on D5 W.    Essential hypertension, benign - Pt is on B blocker. Will continue    Seizure - stable continue home regimen    Complication of feeding tube - reportedly not a feeding tube candidate per prior notes.  - ng tube in place.   - Will require Goals of care meeting given progressive decline in mentation per notes.  DVT prophylaxis - Lovenox  Code Status: Full Family Communication: No family available. Tried to reach [employer] Powell with no response. Disposition Plan: med surg.  Time spent: > 55 minutes  Penny PiaVEGA, [place] [facility] Pager [phone]  **Disclaimer: This note may have been dictated with voice recognition software. Similar sounding words can inadvertently be transcribed and this note  may contain transcription errors which may not have been corrected upon publication of note.**

## 2013-11-25 NOTE — Progress Notes (Signed)
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 Pt is full Code  code.     Pt seen at bed side, sitting up in high fowlers. NG in place to R nares, TF running at 20cc. Pt with no verbal or tactile response to Clinical research associatewriter. Pt appeared comfortable, no nonverbal s/s of pain noted. Writer spoke with staff RN Lupita Leashonna, who reports that pt was able to answer simple questions earlier today.  Per chart review and [facility] records pt's son [name] is POA, no family present at time of visit. [facility] will continue to follow through discharge.  Patient's home medication list and transfer summary in place  on shadow chart.   Please call [facility] @ [phone]-  with any hospice needs.   Thank you. Hansel StarlingKaren E. [name], RN  [facility]  Hospice Liaison  [phone])

## 2013-11-25 NOTE — ED Notes (Signed)
Bed: [id number] Expected date:  Expected time:  Means of arrival:  Comments: EMS/feeding tube issues

## 2013-11-25 NOTE — ED Notes (Signed)
Pt son Joan [name] [id number]

## 2013-11-25 NOTE — Progress Notes (Signed)
Patient came from the ED,arrived to unit at 0845. Patient is non verbal,not responsive to verbal and tactile stimuli. Bath given to patient and kept her comfortable in bed. Will continue to  monitor the patient. I spoke with patient's son [name] and was made aware of patient's condition.- Hulda Marinonna Lanai Conlee RN

## 2013-11-26 DIAGNOSIS — E870 Hyperosmolality and hypernatremia: Principal | ICD-10-CM

## 2013-11-26 LAB — COMPREHENSIVE METABOLIC PANEL
ALT: 12 U/L (ref 0–35)
AST: 35 U/L (ref 0–37)
Albumin: 2.5 g/dL — ABNORMAL LOW (ref 3.5–5.2)
Alkaline Phosphatase: 62 U/L (ref 39–117)
BUN: 29 mg/dL — ABNORMAL HIGH (ref 6–23)
CO2: 25 mEq/L (ref 19–32)
Calcium: 8.8 mg/dL (ref 8.4–10.5)
Chloride: 103 mEq/L (ref 96–112)
Creatinine, Ser: 0.37 mg/dL — ABNORMAL LOW (ref 0.50–1.10)
GFR calc non Af Amer: >90 mL/min (ref >90)
GLUCOSE: 99 mg/dL (ref 70–99)
POTASSIUM: 4 meq/L (ref 3.7–5.3)
SODIUM: 139 meq/L (ref 137–147)
TOTAL PROTEIN: 6.1 g/dL (ref 6.0–8.3)
Total Bilirubin: 0.6 mg/dL (ref 0.3–1.2)

## 2013-11-26 LAB — GLUCOSE, CAPILLARY
GLUCOSE-CAPILLARY: 93 mg/dL (ref 70–99)
GLUCOSE-CAPILLARY: 129 mg/dL — AB (ref 70–99)
Glucose-Capillary: 89 mg/dL (ref 70–99)
Glucose-Capillary: 97 mg/dL (ref 70–99)
Glucose-Capillary: 97 mg/dL (ref 70–99)

## 2013-11-26 LAB — CBC
HCT: 38.5 % (ref 36.0–46.0)
HEMOGLOBIN: 12.5 g/dL (ref 12.0–15.0)
MCH: 31 pg (ref 26.0–34.0)
MCHC: 32.5 g/dL (ref 30.0–36.0)
MCV: 95.5 fL (ref 78.0–100.0)
PLATELETS: 101 10*3/uL — AB (ref 150–400)
RBC: 4.03 MIL/uL (ref 3.87–5.11)
RDW: 14.2 % (ref 11.5–15.5)
WBC: 10.1 10*3/uL (ref 4.0–10.5)

## 2013-11-26 MED ORDER — CIPROFLOXACIN IN D5W 200 MG/100ML IV SOLN
200.0000 mg | Freq: Two times a day (BID) | INTRAVENOUS | Status: DC
  Administered 2013-11-27 – 2013-12-03 (×13): 200 mg via INTRAVENOUS
  Filled 2013-11-26 (×16): qty 100

## 2013-11-26 NOTE — Progress Notes (Signed)
NUTRITION FOLLOW UP  Intervention:   -Continue with Vital AF 1.2 at goal rate of 60 ml/hr with 250 ml free water flushes QID  Nutrition Dx:   Inadequate oral intake related to inability to eat as evidenced by NPO status - improving with TF  Goal:   TF to meet >/= 90% of their estimated nutrition needs    Monitor:   TF tolerance, total protein/energy intake, GI profile, labs, weights  Assessment:   6/29: -Per previous medical records, pt receiving Vital AF 1.2 at 60 ml/hr with 200 ml flush QID.  -Per RN, pt with some episodes of nausea/vomiting pta. Will start at lower rate and advance to goal. RD to assess tolerance  -Increased flushes to 250 ml QID d/t pt's hypernatremia. Receiving IVFs  -Per previous records, pt weighed 153 lbs three months ago, indicating a possible 7 lbs weight loss (4.5% body weight loss, non severe time frame). Dehydration may contribute to decrease in weight.  -No signs of muscle wasting or subcutaneous fat loss in nutrition focused physical exam  -GOC pending d/t progressive decline  -Has NGT replaced, TF to start at 12 PM  6/30: -Pt currently tolerating Vital AF 1.2 at 60 ml/hr w/250 ml flushes QID -No residuals documented -Pt denied stomach pains, nausea, or vomiting -Hypernatremia improved   Height: Ht Readings from Last 1 Encounters:  11/25/13 5\' 3"  (1.6 m)    Weight Status:   Wt Readings from Last 1 Encounters:  11/26/13 154 lb 9.6 oz (70.126 kg)    Re-estimated needs:  Kcal: 1700-1900  Protein: 85-100 gram  Fluid: 2100-2300 ml/daily   Skin: WDL  Diet Order:     Intake/Output Summary (Last 24 hours) at 11/26/13 1601 Last data filed at 11/26/13 1230  Gross per 24 hour  Intake      0 ml  Output      0 ml  Net      0 ml    Last BM: 6/30   Labs:   Recent Labs Lab 11/25/13 0604 11/25/13 1030 11/26/13 0455  NA 156*  --  139  K 4.1  --  4.0  CL 113*  --  103  CO2 28  --  25  BUN 42*  --  29*  CREATININE 0.51  --   0.37*  CALCIUM 9.7  --  8.8  MG  --  2.0  --   PHOS  --  4.1  --   GLUCOSE 110*  --  99    CBG (last 3)   Recent Labs  11/26/13 0535 11/26/13 0754 11/26/13 1202  GLUCAP 89 97 93    Scheduled Meds: . antiseptic oral rinse  15 mL Mouth Rinse q12n4p  . chlorhexidine  15 mL Mouth Rinse BID  . enoxaparin  40 mg Subcutaneous Q24H  . feeding supplement (VITAL AF 1.2 CAL)  1,000 mL Per Tube Q24H  . free water  250 mL Per Tube QID  . lacosamide  200 mg Per Tube BID  . levETIRAcetam  1,500 mg Per Tube BID  . metoprolol tartrate  50 mg Per Tube BID  . mirtazapine  15 mg Per J Tube QHS  . pantoprazole  40 mg Intravenous Q12H  . sodium chloride  3 mL Intravenous Q12H  . Valproic Acid  750 mg Oral 3 times per day    Continuous Infusions: . dextrose 1,000 mL (11/25/13 0958)    Lloyd HugerSarah F Beaty MS RD LDN Clinical Dietitian Pager:[phone]

## 2013-11-26 NOTE — Progress Notes (Signed)
Patient ID: Joan PeelingLora [name], female   DOB: 06/22/1960, 53 y.o.   MRN: [id number]  [facility] PROGRESS NOTE  Joan [name] EXB:[id number] DOB: 04/23/1961 DOA: 11/25/2013 PCP: Karlene EinsteinASANAYAKA,[name], MD  Brief narrative: 53 y.o. female with seizure disorder, history of multiple displaced feeding tubes and NG tube (reportedly per notes patient not candidate for feeding tube placement), history of strokes, hypertension, [facility] NG tube got [facility]lodged. Please note that most of the history was obtained from EMR and and ER physician as patient was unable to provide history given her limited responses based on her past medical history listed. Son reportedly, is caregiver but not available via phone or at bedside for further questioning regarding patient's history.   While in the ED patient was found to have elevated sodium levels and lab work was consistent with dehydration. [facility] asked to admit for further evaluation and management.    Principal Problem:   Hypernatremia - likely pre renal  - IVF provided and now resolved, repeat BMP in AM Active Problems:   UTI - prelim cultures positive for g- rods - place on empiric Cipro and follow upon urine culture    Dehydration - IVF provided and clinically more euvolemic this AM    Essential hypertension, benign - reasonable inpatient control    Leukocytosis - from UTI and dehydration - now resolved - empiric Cipro as noted above    Seizure - continue AED   Complication of feeding tube - stable   Consultants:  None  Procedures/Studies:  Dg Chest 1 View  11/25/2013   No definite acute cardiopulmonary process of the lung apices obscured by facial structures.    Dg Abd 1 View  11/25/2013 Nasogastric tube tip projects in proximal stomach.  Nonspecific bowel gas pattern, partially imaged.     Dg Abd 1 View  11/25/2013   The nasogastric tube is no longer visible in the abdomen.     Antibiotics:  Cipro IV 6/30 -->   Code Status:  Full Family Communication: No family at bedside  Disposition Plan: Remains inpatient   HPI/Subjective: No events overnight.   Objective: Filed Vitals:   11/25/13 1342 11/25/13 2039 11/26/13 0549 11/26/13 1405  BP: 112/70 131/76 106/81 125/73  Pulse: 94 102 99 100  Temp: 99.2 F (37.3 C) 98.9 F (37.2 C) 98 F (36.7 C) 98.3 F (36.8 C)  TempSrc: Axillary Oral Oral Oral  Resp: 16 20 16 18   Height:  5\' 3"  (1.6 m)    Weight:   70.126 kg (154 lb 9.6 oz)   SpO2: 100% 100% 100% 99%    Intake/Output Summary (Last 24 hours) at 11/26/13 1540 Last data filed at 11/26/13 1230  Gross per 24 hour  Intake      0 ml  Output      0 ml  Net      0 ml    Exam:   General:  Pt is somnolent but easy to arouse, non verbal with me, not following any commands   Cardiovascular: Regular rate and rhythm, S1/S2, no murmurs, no rubs, no gallops  Respiratory: Clear to auscultation bilaterally, no wheezing, diminished breath sounds at bases   Abdomen: Soft, non tender, non distended, bowel sounds present, no guarding  Data Reviewed: Basic Metabolic Panel:  Recent Labs Lab 11/25/13 0604 11/25/13 1030 11/26/13 0455  NA 156*  --  139  K 4.1  --  4.0  CL 113*  --  103  CO2 28  --  25  GLUCOSE 110*  --  99  BUN 42*  --  29*  CREATININE 0.51  --  0.37*  CALCIUM 9.7  --  8.8  MG  --  2.0  --   PHOS  --  4.1  --    Liver Function Tests:  Recent Labs Lab 11/26/13 0455  AST 35  ALT 12  ALKPHOS 62  BILITOT 0.6  PROT 6.1  ALBUMIN 2.5*   CBC:  Recent Labs Lab 11/25/13 0604 11/26/13 0455  WBC 11.2* 10.1  NEUTROABS 9.3*  --   HGB 14.5 12.5  HCT 46.4* 38.5  MCV 98.7 95.5  PLT 121* 101*   CBG:  Recent Labs Lab 11/25/13 1629 11/25/13 2018 11/26/13 0535 11/26/13 0754 11/26/13 1202  GLUCAP 112* 109* 89 97 93    Recent Results (from the past 240 hour(s))  URINE CULTURE     Status: None   Collection Time    11/25/13  6:22 AM      Result Value Ref Range Status    Specimen Description URINE, CATHETERIZED   Final   Special Requests NONE   Final   Culture  Setup Time     Final   Value: 11/25/2013 11:43     Performed at [facility]   Colony Count     Final   Value: >=100,000 COLONIES/ML     Performed at [facility]   Culture     Final   Value: GRAM NEGATIVE RODS     Performed at [facility]   Report Status PENDING   Incomplete     Scheduled Meds: . antiseptic oral rinse  15 mL Mouth Rinse q12n4p  . chlorhexidine  15 mL Mouth Rinse BID  . enoxaparin  40 mg Subcutaneous Q24H  . feeding supplement (VITAL AF 1.2 CAL)  1,000 mL Per Tube Q24H  . free water  250 mL Per Tube QID  . lacosamide  200 mg Per Tube BID  . levETIRAcetam  1,500 mg Per Tube BID  . metoprolol tartrate  50 mg Per Tube BID  . mirtazapine  15 mg Per J Tube QHS  . pantoprazole  40 mg Intravenous Q12H  . sodium chloride  3 mL Intravenous Q12H  . Valproic Acid  750 mg Oral 3 times per day   Continuous Infusions: . dextrose 1,000 mL (11/25/13 0958)   Debbora PrestoMAGICK-MYERS, ISKRA, MD  [facility] Pager [phone]  If 7PM-7AM, please contact night-coverage [email or web address] Password [email or web address] 11/26/2013, 3:40 PM   LOS: 1 day

## 2013-11-26 NOTE — Progress Notes (Addendum)
Inpatient RN visit- [facility]ora [facility] RN Visit-[name] Merilynn FinlandRobertson RN  [facility] of Creutzfeldt-Jakob dz. Pt is full code. Pt alert, oriented to person, able to converse, stated [facility] also said her son [name] had been by earlier. Denied pain.  Per staff RN Cala BradfordKimberly and chart review pt's tube feeding is now at goal rate. Emotional support offered. Pt stated she was "ready to go home". No family present at time of visit. No discharge plans at time of visit. [facility] will continue to follow.  Patient's home medication list and transfer summary in place on shadow chart.   Please call [facility] @ [phone]-  with any hospice needs.   Thank you. Hansel StarlingKaren E. [name], RN  [facility]  Hospice Liaison  [phone])

## 2013-11-26 NOTE — Care Management Note (Addendum)
    Page 1 of 1   12/04/2013     11:25:43 AM CARE MANAGEMENT NOTE 12/04/2013  Patient:  Joan [name],Joan [name]   Account Number:  [id number]  Date Initiated:  11/26/2013  Documentation initiated by:  [facility]Abie Killian  Subjective/Objective Assessment:   adm: Dehydration, hypernatremia;Ccreutzfeldt-Jacob     Action/Plan:   home with hospice (already active with [facility])   Anticipated DC Date:  11/29/2013   Anticipated DC Plan:  HOME W HOME HEALTH SERVICES         Choice offered to / List presented to:             Status of service:  Completed, signed off Medicare Important Message given?   (If response is "NO", the following Medicare IM given date fields will be blank) Date Medicare IM given:   Medicare IM given by:   Date Additional Medicare IM given:   Additional Medicare IM given by:    Discharge Disposition:  HOME W HOSPICE CARE  Per UR Regulation:    If discussed at Long Length of Stay Meetings, dates discussed:    Comments:  12/04/13 10:00 CM placed call to [facility] rep, Clydie BraunKaren to notify of discharge.  DNR placed on shadow chart for MD to complete.  RN to call son of pt when discharge is imminent. No other CM needs were communicated.  Freddy JakschSarah Caydence Enck, BSN, Caryl AdaM [phone].  11/26/13 13:44 CM notes [facility] and plan is to return home with [facility].  CM [facility] with [facility]die [facility]Karen [facility]  who states pt's social worker planning hospital visit today.  No other CM needs were communicated.  Freddy JakschSarah Tashonna Descoteaux, BSN, CM [phone].

## 2013-11-27 LAB — GLUCOSE, CAPILLARY
GLUCOSE-CAPILLARY: 104 mg/dL — AB (ref 70–99)
GLUCOSE-CAPILLARY: 110 mg/dL — AB (ref 70–99)
GLUCOSE-CAPILLARY: 111 mg/dL — AB (ref 70–99)
Glucose-Capillary: 87 mg/dL (ref 70–99)
Glucose-Capillary: 98 mg/dL (ref 70–99)
Glucose-Capillary: 98 mg/dL (ref 70–99)

## 2013-11-27 LAB — BASIC METABOLIC PANEL
BUN: 19 mg/dL (ref 6–23)
CALCIUM: 8.3 mg/dL — AB (ref 8.4–10.5)
CO2: 23 mEq/L (ref 19–32)
CREATININE: 0.36 mg/dL — AB (ref 0.50–1.10)
Chloride: 100 mEq/L (ref 96–112)
GFR calc non Af Amer: >90 mL/min (ref >90)
Glucose, Bld: 110 mg/dL — ABNORMAL HIGH (ref 70–99)
Potassium: 3.2 mEq/L — ABNORMAL LOW (ref 3.7–5.3)
Sodium: 139 mEq/L (ref 137–147)

## 2013-11-27 LAB — URINE CULTURE

## 2013-11-27 LAB — CBC
HCT: 34.5 % — ABNORMAL LOW (ref 36.0–46.0)
Hemoglobin: 11.4 g/dL — ABNORMAL LOW (ref 12.0–15.0)
MCH: 31.1 pg (ref 26.0–34.0)
MCHC: 33 g/dL (ref 30.0–36.0)
MCV: 94 fL (ref 78.0–100.0)
PLATELETS: 118 10*3/uL — AB (ref 150–400)
RBC: 3.67 MIL/uL — ABNORMAL LOW (ref 3.87–5.11)
RDW: 13.9 % (ref 11.5–15.5)
WBC: 8.2 10*3/uL (ref 4.0–10.5)

## 2013-11-27 MED ORDER — OSMOLITE 1.5 CAL PO LIQD
1000.0000 mL | ORAL | Status: DC
  Administered 2013-11-27 – 2013-12-03 (×7): 1000 mL
  Filled 2013-11-27 (×10): qty 1000

## 2013-11-27 NOTE — Progress Notes (Signed)
Inpatient RN visit- Joan PeelingLora [name] [name] RN Visit-Joan Merilynn FinlandRobertson RN  [facility] diagnosis of Creutzfeldt-Jakob dz.  Pt is Full Code code.    Pt seen at bedside, eyes closed, appeared to be sleeping, pt did not wake to gentle voice or tactile stimuli. No non verbal s/s of pain or discomfort.  Per chart review,  pt's tube feeding formula has been changed to Osmolite 1.5. Writer spoke with staff RN Joan [name] and Dietician Joan [name] regarding reason for change. Trial is to assess whether pt will have fewer loose bowel movements. Per chart review pt has had 10 BM's in the past 24 hrs. Writer [facility] with [facility] to confirm that [facility] would be able to supply the new tube feeding formula. [facility] would supply the new formula if it is needed. Will need to order prior to discharge for delivery to pt's home. Writer also spoke with pt's son Joan [name] [phone]) during visit regarding change in tube feeding, explained that [facility] would supply the new formula if/when it is ordered at discharge. Attending Md Dr. Elvera LennoxGherghe  questioned writer on pt's code status. Writer advsd that [facility] and was a Full Code at home. Attending plans to order PMT consult to address goals of care. No discharge plans at this time. [facility] will continue to follow through discharge.   Patient's home medication list and transfer summary in place on shadow chart.   Please call [facility] @ [phone] with any hospice needs.   Thank you. Hansel StarlingKaren E. [name], RN  [facility]  Hospice Liaison  [phone])

## 2013-11-27 NOTE — Progress Notes (Addendum)
PROGRESS NOTE  Joan [name] HYQ:[id number] DOB: 04/05/1961 DOA: 11/25/2013 PCP: Karlene EinsteinASANAYAKA,[name], MD  Brief narrative: 53 y.o. female with seizure disorder, history of multiple displaced feeding tubes and NG tube (reportedly per notes patient not candidate for feeding tube placement), history of strokes, hypertension, [facility] after NG tube got dislodged. Please note that most of the history was obtained from EMR and and ER physician as patient was unable to provide history given her limited responses based on her past medical history listed. Son reportedly, is caregiver but not available via phone or at bedside for further questioning regarding patient's history. While in the ED patient was found to have elevated sodium levels and lab work was consistent with dehydration. TRH asked to admit for further evaluation and management.     Hypernatremia- due to dehydration, IVF provided and now resolved UTI - prelim cultures positive for g- rods, place on empiric Cipro and follow upon urine culture  Dehydration - IVF provided and clinically more euvolemic this AM. Resume feeds. Essential hypertension, benign - reasonable inpatient control  Leukocytosis - from UTI and dehydration, continue Cipro Seizure - continue AED Complication of feeding tube - stable, per previous notes not a candidate for J tube replacement.  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 She is a full code. Have asked palliative consult to help assist with goals of care at this point.  Consultants:  Palliative care  Procedures/Studies:  Dg Chest 1 View  11/25/2013   No definite acute cardiopulmonary process of the lung apices obscured by facial structures.    Dg Abd 1 View  11/25/2013 Nasogastric tube tip projects in proximal stomach.  Nonspecific bowel gas pattern, partially imaged.     Dg Abd 1 View  11/25/2013   The nasogastric tube is no longer visible in the abdomen.      Antibiotics:  Cipro IV 6/30 -->   Code Status: Full Family Communication: No family at bedside  Disposition Plan: Remains inpatient   HPI/Subjective: No events overnight.   Objective: Filed Vitals:   11/26/13 0549 11/26/13 1405 11/26/13 2050 11/27/13 0548  BP: 106/81 125/73 109/54 113/73  Pulse: 99 100 95 102  Temp: 98 F (36.7 C) 98.3 F (36.8 C) 98.9 F (37.2 C) 97.9 F (36.6 C)  TempSrc: Oral Oral Oral Oral  Resp: 16 18 20 16   Height:      Weight: 70.126 kg (154 lb 9.6 oz)   70.262 kg (154 lb 14.4 oz)  SpO2: 100% 99% 100% 100%    Intake/Output Summary (Last 24 hours) at 11/27/13 1202 Last data filed at 11/27/13 0900  Gross per 24 hour  Intake 6789.33 ml  Output      0 ml  Net 6789.33 ml    Exam:   General:  Pt is somnolent but easy to arouse, non verbal with me, not following any commands   Cardiovascular: Regular rate and rhythm, S1/S2, no murmurs, no rubs, no gallops  Respiratory: Clear to auscultation bilaterally, no wheezing, diminished breath sounds at bases   Abdomen: Soft, non tender, non distended, bowel sounds present, no guarding  Data Reviewed: Basic Metabolic Panel:  Recent Labs Lab 11/25/13 0604 11/25/13 1030 11/26/13 0455 11/27/13 0010  NA 156*  --  139 139  K 4.1  --  4.0 3.2*  CL 113*  --  103 100  CO2 28  --  25 23  GLUCOSE 110*  --  99 110*  BUN 42*  --  29* 19  CREATININE 0.51  --  0.37* 0.36*  CALCIUM 9.7  --  8.8 8.3*  MG  --  2.0  --   --   PHOS  --  4.1  --   --    Liver Function Tests:  Recent Labs Lab 11/26/13 0455  AST 35  ALT 12  ALKPHOS 62  BILITOT 0.6  PROT 6.1  ALBUMIN 2.5*   CBC:  Recent Labs Lab 11/25/13 0604 11/26/13 0455 11/27/13  WBC 11.2* 10.1 8.2  NEUTROABS 9.3*  --   --   HGB 14.5 12.5 11.4*  HCT 46.4* 38.5 34.5*  MCV 98.7 95.5 94.0  PLT 121* 101* 118*   CBG:  Recent Labs Lab 11/26/13 1710 11/26/13 2035 11/27/13 0011 11/27/13 0459 11/27/13 0817  GLUCAP 129* 97 111* 87 98     Recent Results (from the past 240 hour(s))  URINE CULTURE     Status: None   Collection Time    11/25/13  6:22 AM      Result Value Ref Range Status   Specimen Description URINE, CATHETERIZED   Final   Special Requests NONE   Final   Culture  Setup Time     Final   Value: 11/25/2013 11:43     Performed at [facility]   Colony Count     Final   Value: >=100,000 COLONIES/ML     [facility]formed at [facility]   Culture     Final   Value: KLEBSIELLA PNEUMONIAE     Performed at [facility]   Report Status 11/27/2013 FINAL   Final   Organism ID, Bacteria KLEBSIELLA PNEUMONIAE   Final     Scheduled Meds: . antiseptic oral rinse  15 mL Mouth Rinse q12n4p  . chlorhexidine  15 mL Mouth Rinse BID  . ciprofloxacin  200 mg Intravenous Q12H  . enoxaparin  40 mg Subcutaneous Q24H  . free water  250 mL Per Tube QID  . lacosamide  200 mg Per Tube BID  . levETIRAcetam  1,500 mg Per Tube BID  . metoprolol tartrate  50 mg Per Tube BID  . mirtazapine  15 mg Per J Tube QHS  . pantoprazole  40 mg Intravenous Q12H  . sodium chloride  3 mL Intravenous Q12H  . Valproic Acid  750 mg Oral 3 times per day   Continuous Infusions: . dextrose 50 mL/hr at 11/27/13 0319  . feeding supplement (OSMOLITE 1.5 CAL) 1,000 mL (11/27/13 1051)   Joan [name], COSTIN, MD, PhD Pager [phone]  If 7PM-7AM, please contact night-coverage [email or web address] Password [email or web address] 11/27/2013, 12:02 PM  Time spent: 25 minutes   LOS: 2 days

## 2013-11-27 NOTE — Social Work (Signed)
[facility] Social Work visit -- [name], LCSWA  SW visited pt, who appeared to be sleeping comfortably in her room. Pt awoke from gentle touch to her shoulder and SW saying her name. SW assessed for pain or discomfort and pt stated that her leg is bothering her a little bit. SW notified [facility] RN, Joan BarkerKaren [name]. SW introduced herself to pt and assessed for questions/concerns. Pt stated "I'm ready to go home." SW explained that the hospital staff have changed her tube feeding and are going to watch her for 24 hours to see if it makes her more comfortable (she was having loose BM, 10x / 24 hours.) Pt expressed understanding. Pt asked if her son, Joan [name] is coming. SW informed pt that SW had talked to CoronaMarcus earlier, and he had stated that he wants pt to come home upon d/c. SW was unsure if Joan [name] is coming to the hospital. SW offered to call Joan [name] for pt; pt stated, "just tell him I'm ready to come home." SW agreed to do so. SW provided emotional support through active listening, reflection, and a warm, supportive presence. SW consulted w/ [facility] RN, Joan BarkerKaren [name], who stated that they plan to do a trial of the new feeding tube bag before they d/c pt. RN asked about pt's code status and SW informed her that Joan [name] had stated upon admission that he wanted to discuss code status w/ pt first. Charity fundraiserN and SW discussed pt's mental capacity, and RN informed SW that the doctor had called a PMT consult and that RN would provide SW's contact information so that SW can be present at meeting if possible.   [name], LCSWA [phone], ext. [phone]

## 2013-11-27 NOTE — Progress Notes (Signed)
NUTRITION FOLLOW UP  Intervention:   -Per multidisciplinary rounds and discussion with pt's family, trial Osmolite 1.5 at goal rate of 50 ml/hr with 250 ml flush QID and assess tolerance  - Osmolite 1.5 @ goal rate 50 ml/hr via PEG provides 1800 kcal (100% of est kcal needs), 75 grams of protein (88% est protein needs), and 914 ml of H2O.    Nutrition Dx:   Inadequate oral intake related to inability to eat as evidenced by NPO status - improving with TF  Goal:   TF to meet >/= 90% of their estimated nutrition needs    Monitor:   TF tolerance, total protein/energy intake, GI profile, labs, weights  Assessment:   6/29: -Per previous medical records, pt receiving Vital AF 1.2 at 60 ml/hr with 200 ml flush QID.  -Per RN, pt with some episodes of nausea/vomiting pta. Will start at lower rate and advance to goal. RD to assess tolerance  -Increased flushes to 250 ml QID d/t pt's hypernatremia. Receiving IVFs  -Per previous records, pt weighed 153 lbs three months ago, indicating a possible 7 lbs weight loss (4.5% body weight loss, non severe time frame). Dehydration may contribute to decrease in weight.  -No signs of muscle wasting or subcutaneous fat loss in nutrition focused physical exam  -GOC pending d/t progressive decline  -Has NGT replaced, TF to start at 12 PM  6/30: -Pt currently tolerating Vital AF 1.2 at 60 ml/hr w/250 ml flushes QID -No residuals documented -Pt denied stomach pains, nausea, or vomiting -Hypernatremia improved  7/01: - Pt discussed during multidisciplinary rounds. RN expressed concern for TF intolerance d/t frequent episodes of loose stools -Contacted pt's son, who reported pt with ongoing loose stools with TF home regimen. Pt was receiving Jevity 1.5 at 50 ml/hr, which provides 1800 kcal, and 77 gram protein -Will trial lower fiber Osmolite 1.5 at 50 ml/hr and assess tolerance. Medications may also be contributing to loose stools     Height: Ht Readings  from Last 1 Encounters:  11/25/13 5\' 3"  (1.6 m)    Weight Status:   Wt Readings from Last 1 Encounters:  11/27/13 154 lb 14.4 oz (70.262 kg)    Re-estimated needs:  Kcal: 1700-1900  Protein: 85-100 gram  Fluid: 2100-2300 ml/daily   Skin: WDL  Diet Order:     Intake/Output Summary (Last 24 hours) at 11/27/13 0945 Last data filed at 11/27/13 0700  Gross per 24 hour  Intake 6789.33 ml  Output      0 ml  Net 6789.33 ml    Last BM: 6/30   Labs:   Recent Labs Lab 11/25/13 0604 11/25/13 1030 11/26/13 0455 11/27/13 0010  NA 156*  --  139 139  K 4.1  --  4.0 3.2*  CL 113*  --  103 100  CO2 28  --  25 23  BUN 42*  --  29* 19  CREATININE 0.51  --  0.37* 0.36*  CALCIUM 9.7  --  8.8 8.3*  MG  --  2.0  --   --   PHOS  --  4.1  --   --   GLUCOSE 110*  --  99 110*    CBG (last 3)   Recent Labs  11/27/13 0011 11/27/13 0459 11/27/13 0817  GLUCAP 111* 87 98    Scheduled Meds: . antiseptic oral rinse  15 mL Mouth Rinse q12n4p  . chlorhexidine  15 mL Mouth Rinse BID  . ciprofloxacin  200 mg Intravenous  Q12H  . enoxaparin  40 mg Subcutaneous Q24H  . free water  250 mL Per Tube QID  . lacosamide  200 mg Per Tube BID  . levETIRAcetam  1,500 mg Per Tube BID  . metoprolol tartrate  50 mg Per Tube BID  . mirtazapine  15 mg Per J Tube QHS  . pantoprazole  40 mg Intravenous Q12H  . sodium chloride  3 mL Intravenous Q12H  . Valproic Acid  750 mg Oral 3 times per day    Continuous Infusions: . dextrose 50 mL/hr at 11/27/13 0319  . feeding supplement (OSMOLITE 1.5 CAL)      Lloyd HugerSarah F Izela Altier MS RD LDN Clinical Dietitian Pager:[phone]

## 2013-11-27 NOTE — Consult Note (Addendum)
WOC wound consult note Reason for Consult: Consult requested for buttocks and inner groin areas.   Wound type: Buttocks with scar tissue intact.  Flexiseal in place to control stool but also leaking around the tube at the insertion site.  Buttocks skin with few patchy areas of partial thickness breakdown, pink and moist.  Left inner groin with moisture associated skin damage; 1X1X.1cm to affected area, patchy areas of partial thickness breakdown, pink and moist of unknown etiology.   Right inner groin with moisture associated skin damage; 3X1X.1cm to affected area, patchy areas of partial thickness breakdown, pink and moist of unknown etiology.   Pressure Ulcer POA: Present on admission; YES but appearance and location is NOT consistent with pressure ulcers. Dressing procedure/placement/frequency: Barrier cream to inner groin bilat and buttocks to protect and repel moisture. Please re-consult if further assistance is needed.  Thank-you,  Cammie Mcgeeawn Sven Pinheiro MSN, RN, CWOCN, [place]-AP, CNS [phone]

## 2013-11-27 NOTE — Progress Notes (Signed)
PT Cancellation Note  Patient Details Name: Joan [name] MRN: [id number] DOB: 03/09/1961   Cancelled Treatment:    Reason Eval/Treat Not Completed: Medical issues which prohibited therapy (Potassium 3.2). Will follow.    Ralene BatheUhlenberg, Earland Reish Kistler 11/27/2013, 12:14 PM [phone]

## 2013-11-28 DIAGNOSIS — A0472 Enterocolitis due to Clostridium difficile, not specified as recurrent: Secondary | ICD-10-CM

## 2013-11-28 DIAGNOSIS — R1310 Dysphagia, unspecified: Secondary | ICD-10-CM

## 2013-11-28 DIAGNOSIS — G9340 Encephalopathy, unspecified: Secondary | ICD-10-CM | POA: Diagnosis not present

## 2013-11-28 LAB — GLUCOSE, CAPILLARY
GLUCOSE-CAPILLARY: 115 mg/dL — AB (ref 70–99)
Glucose-Capillary: 108 mg/dL — ABNORMAL HIGH (ref 70–99)
Glucose-Capillary: 112 mg/dL — ABNORMAL HIGH (ref 70–99)
Glucose-Capillary: 83 mg/dL (ref 70–99)
Glucose-Capillary: 101 mg/dL — ABNORMAL HIGH (ref 70–99)

## 2013-11-28 LAB — CBC
HEMATOCRIT: 35.4 % — AB (ref 36.0–46.0)
Hemoglobin: 11.8 g/dL — ABNORMAL LOW (ref 12.0–15.0)
MCH: 31.4 pg (ref 26.0–34.0)
MCHC: 33.3 g/dL (ref 30.0–36.0)
MCV: 94.1 fL (ref 78.0–100.0)
Platelets: 152 10*3/uL (ref 150–400)
RBC: 3.76 MIL/uL — ABNORMAL LOW (ref 3.87–5.11)
RDW: 14 % (ref 11.5–15.5)
WBC: 6.9 10*3/uL (ref 4.0–10.5)

## 2013-11-28 LAB — BASIC METABOLIC PANEL
Anion gap: 10 (ref 5–15)
BUN: 18 mg/dL (ref 6–23)
CHLORIDE: 100 meq/L (ref 96–112)
CO2: 28 mEq/L (ref 19–32)
CREATININE: 0.37 mg/dL — AB (ref 0.50–1.10)
Calcium: 8.6 mg/dL (ref 8.4–10.5)
Glucose, Bld: 101 mg/dL — ABNORMAL HIGH (ref 70–99)
Potassium: 3.3 mEq/L — ABNORMAL LOW (ref 3.7–5.3)
Sodium: 138 mEq/L (ref 137–147)

## 2013-11-28 LAB — CLOSTRIDIUM DIFFICILE BY PCR: Toxigenic C. Difficile by PCR: POSITIVE — AB

## 2013-11-28 MED ORDER — VANCOMYCIN 50 MG/ML ORAL SOLUTION
125.0000 mg | Freq: Four times a day (QID) | ORAL | Status: DC
  Administered 2013-11-28 – 2013-12-04 (×25): 125 mg via ORAL
  Filled 2013-11-28 (×28): qty 2.5

## 2013-11-28 NOTE — Consult Note (Signed)
NEURO HOSPITALIST CONSULT NOTE    Reason for Consult: neurodegenerative decline.   HPI:                                                                                                                                          Joan [name] is an 53 y.o. female who has been admitted to hospital multiple times for both seizures and encephalopathy.  In past hospitalizations aborting her seizures required high doses of antiepileptic medications.  Due to concern over the etiology of the seizure activity patient had a full work up that included a LP to rule out encephalitis and . All titers returned normal. Patient was showing clinical improvement and was therefore discharged to be followed up on an outpatient basis. Patient did not return to baseline though and was readmitted. With readmission serum titers for encephalitic etiologies were obtained and the patient was empirically treated with steroids and Solumedrol. Patient followed up with her primary neurologist Dr. Pearlean Brownie who ordered a follow up MRI and EEG. 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 Pearlean Brownie to Dr. Lindell Noe. Patient was to follow up with Dr. Pearlean Brownie however has not done so yet. patient was readmitted to hospital due to dehydration, hypernatremia and UTI.     Past Medical History  Diagnosis Date  . Peripheral neuropathy   . Fibromyalgia   . Hypertension   . Anxiety   . Catatonia   . Seizures     Past Surgical History  Procedure Laterality Date  . Gastric bypass    . Abdominal hysterectomy    . Jejunostomy N/A 06/25/2013    Procedure:  OPEN JEJUNOSTOMY FEEDING TUBE ;  Surgeon: Cherylynn Ridges, MD;  Location: [facility] OR;  Service: General;  Laterality: N/A;  . Laparotomy N/A 07/02/2013    Procedure: EXPLORATORY LAPAROTOMY with revision of jejunostomy feeding tube.;  Surgeon: Cherylynn Ridges, MD;  Location: [facility] OR;  Service: General;  Laterality: N/A;  . Bowel resection N/A 07/02/2013    Procedure: SMALL  BOWEL RESECTION;  Surgeon: Cherylynn Ridges, MD;  Location: [facility] OR;  Service: General;  Laterality: N/A;    Family History  Problem Relation Age of Onset  . Hypertension Mother   . Diabetes Father       Social History:  reports that she has never smoked. She has never used smokeless tobacco. She reports that she does not drink alcohol. Her drug history is not on file.  Allergies  Allergen Reactions  . Penicillins Other (See Comments), Rash and Shortness Of Breath    REACTION: Makes her body hurt.  . Prednisone Palpitations  . Citalopram Nausea And Vomiting  . Clindamycin Rash and Other (See Comments)    GI upset  . Duloxetine Hcl Other (See Comments)  GI upset   . Escitalopram Nausea And Vomiting  . Hydromorphone Other (See Comments)    Generalized severe pain  . Morphine Other (See Comments)    Generalized severe pain  . Paroxetine Other (See Comments)    GI upset  . Sertraline Other (See Comments)    GI upset  . Sertraline Hcl Other (See Comments)    REACTION:  unknown  . Sulfamethoxazole-Trimethoprim Nausea And Vomiting  . Tape Dermatitis  . Clarithromycin Rash  . Doxycycline Rash and Other (See Comments)    GI upset  . Metoclopramide Anxiety  . Morphine And Related Other (See Comments)    REACTION: Causes pain  . Paroxetine Hcl Other (See Comments)    REACTION:  unknown    MEDICATIONS:                                                                                                                     Prior to Admission:  Prescriptions prior to admission  Medication Sig Dispense Refill  . clonazePAM (KLONOPIN) 0.5 MG tablet Take 0.5 mg by mouth 3 (three) times daily as needed for anxiety. Via feeding tube      . lacosamide (VIMPAT) 200 MG TABS tablet Place 1 tablet (200 mg total) into feeding tube 2 (two) times daily.  60 tablet  0  . levETIRAcetam (KEPPRA) 1000 MG tablet Place 1.5 tablets (1,500 mg total) into feeding tube 2 (two) times daily.  60 tablet  0  .  LORazepam (ATIVAN) 2 MG/ML injection Inject 0.25-0.5 mLs (0.5-1 mg total) into the vein every 6 (six) hours as needed for anxiety.  1 mL  0  . metoprolol (LOPRESSOR) 50 MG tablet Place 1 tablet (50 mg total) into feeding tube 2 (two) times daily.  75 tablet  0  . mirtazapine (REMERON SOL-TAB) 15 MG disintegrating tablet 1 tablet (15 mg total) by Per J Tube route at bedtime.      . pantoprazole (PROTONIX) 40 MG injection Inject 40 mg into the vein every 12 (twelve) hours.  1 each    . potassium chloride (KLOR-CON) 20 MEQ packet Take 20 mEq by mouth daily.      . risperiDONE (RISPERDAL) 0.5 MG tablet Take 0.5 mg by mouth 2 (two) times daily. Via feeding tube      . Valproic Acid (DEPAKENE) 250 MG/5ML SYRP syrup Take 15 mLs (750 mg total) by mouth every 8 (eight) hours.  600 mL  0  . Water For Irrigation, Sterile (FREE WATER) SOLN Place 200 mLs into feeding tube 4 (four) times daily.  200 mL  1   Scheduled: . antiseptic oral rinse  15 mL Mouth Rinse q12n4p  . chlorhexidine  15 mL Mouth Rinse BID  . ciprofloxacin  200 mg Intravenous Q12H  . enoxaparin  40 mg Subcutaneous Q24H  . free water  250 mL Per Tube QID  . lacosamide  200 mg Per Tube BID  . levETIRAcetam  1,500 mg Per Tube BID  . metoprolol tartrate  50 mg Per Tube BID  . mirtazapine  15 mg Per J Tube QHS  . pantoprazole  40 mg Intravenous Q12H  . sodium chloride  3 mL Intravenous Q12H  . Valproic Acid  750 mg Oral 3 times per day  . vancomycin  125 mg Oral QID     ROS:                                                                                                                                       History obtained from unobtainable from patient due to drowsiness    Blood pressure 111/55, pulse 97, temperature 98.3 F (36.8 C), temperature source Oral, resp. rate 18, height 5\' 3"  (1.6 m), weight 70.6 kg (155 lb 10.3 oz), SpO2 100.00%.   Neurologic Examination:                                                                                                       Mental Status: Drowsy but able to awaken by voice.  She does not know year or hospital name. Speech fluent without evidence of aphasia.  Follows commands such as opening and closing eyes and tracking my finger.  Cranial Nerves: II: Discs flat bilaterally; Visual fields grossly normal, pupils equal, round, reactive to light and accommodation [name],IV, VI: ptosis not present, extra-ocular motions intact bilaterally V,VII: smile symmetric, facial light touch sensation normal bilaterally VIII: hearing normal bilaterally IX,X: gag reflex present XI: bilateral shoulder shrug XII: midline tongue extension  Motor: Bilateral UE significantly contracted and shows 4/5 strength. Bilateral LE moves antigravity to pain but cannot overcome resistance.  Sensory: withdraws to pain bilaterally UE and LE Deep Tendon Reflexes:  Depressed throughout  Plantars: Mute bilaterally Cerebellar: Unable to assess    Lab Results: Basic Metabolic Panel:  Recent Labs Lab 11/25/13 0604 11/25/13 1030 11/26/13 0455 11/27/13 0010 11/28/13 0418  NA 156*  --  139 139 138  K 4.1  --  4.0 3.2* 3.3*  CL 113*  --  103 100 100  CO2 28  --  25 23 28   GLUCOSE 110*  --  99 110* 101*  BUN 42*  --  29* 19 18  CREATININE 0.51  --  0.37* 0.36* 0.37*  CALCIUM 9.7  --  8.8 8.3* 8.6  MG  --  2.0  --   --   --   PHOS  --  4.1  --   --   --     Liver  Function Tests:  Recent Labs Lab 11/26/13 0455  AST 35  ALT 12  ALKPHOS 62  BILITOT 0.6  PROT 6.1  ALBUMIN 2.5*   No results found for this basename: LIPASE, AMYLASE,  in the last 168 hours No results found for this basename: AMMONIA,  in the last 168 hours  CBC:  Recent Labs Lab 11/25/13 0604 11/26/13 0455 11/27/13 11/28/13 0418  WBC 11.2* 10.1 8.2 6.9  NEUTROABS 9.3*  --   --   --   HGB 14.5 12.5 11.4* 11.8*  HCT 46.4* 38.5 34.5* 35.4*  MCV 98.7 95.5 94.0 94.1  PLT 121* 101* 118* 152    Cardiac Enzymes: No results  found for this basename: CKTOTAL, CKMB, CKMBINDEX, TROPONINI,  in the last 168 hours  Lipid Panel: No results found for this basename: CHOL, TRIG, HDL, CHOLHDL, VLDL, LDLCALC,  in the last 168 hours  CBG:  Recent Labs Lab 11/27/13 1655 11/27/13 2025 11/28/13 0437 11/28/13 0753 11/28/13 1202  GLUCAP 104* 98 108* 112* 115*    Microbiology: Results for orders placed during the hospital encounter of 11/25/13  URINE CULTURE     Status: None   Collection Time    11/25/13  6:22 AM      Result Value Ref Range Status   Specimen Description URINE, CATHETERIZED   Final   Special Requests NONE   Final   Culture  Setup Time     Final   Value: 11/25/2013 11:43     Performed at [facility] Count     Final   Value: >=100,000 COLONIES/ML     Performed at [facility]   Culture     Final   Value: KLEBSIELLA PNEUMONIAE     Performed at [facility]   Report Status 11/27/2013 FINAL   Final   Organism ID, Bacteria KLEBSIELLA PNEUMONIAE   Final  CLOSTRIDIUM DIFFICILE BY PCR     Status: Abnormal   Collection Time    11/28/13  8:18 AM      Result Value Ref Range Status   C difficile by pcr POSITIVE (*) NEGATIVE Final   Comment: CRITICAL RESULT CALLED TO, READ BACK BY AND VERIFIED WITH:     Roslyn Smiling RN 10:55 11/28/13 ([email or web address])     Performed at [facility]    Coagulation Studies: No results found for this basename: LABPROT, INR,  in the last 72 hours  Imaging: No results found.  Felicie Morn PA-C [facility] [phone]  11/28/2013, 3:45 PM  Patient seen and examined.  Clinical course and management discussed.  Necessary edits performed.  I agree with the above.  Assessment and plan of care developed and discussed below.    Assessment/Plan: 53 YO female with what is felt to be a neurodegenerative disorder. Followed by neurology.  Has had an extensive work up in the past and currently with CJD titers pending.  Currently she is in  hospital with decreased mental status and being treated for both UTI and C.Diff. As seen with the patient in the past she does have a decreased mental status in the setting of concurrent infection.    Recommendations: 1.  Prognosis and further management to be discussed with outpatient neurologist once pending lab results are available.  Patient already has an appointment scheduled. 2.  Agree with treatment for infection 3.  No further inpatient work up indicated at this time. If further questions arise, please call or page at that time.  Thank you for allowing neurology to participate in the care of this patient.    Thana FarrLeslie Keddrick Wyne, MD [facility] [phone]  11/28/2013  6:57 PM

## 2013-11-28 NOTE — Progress Notes (Addendum)
In[name]- [name] -[name] Visit-[name] Merilynn FinlandRobertson [name]  [facility] Creutzfeldt-Jakob dz. Pt is Full Code.     Pt alert, oriented to self, talkative today. Asking about going home, talking about her son. She reports "he takes good care of me". She also expressed concern about her son, then requested "something to make me feel better, I feel all worried".  Staff [name] Arline Aspindy made aware of patient's needs.  Per chart review and discussion with RD Christia ReadingSarah Beaty change in TF did not change frequency of loose stools. Pt will continue on the Osmolite while hospitalized, but at  d/c she will return to the Jevity 1.5 that she was on  at home. Feeding given via NG tube to R nares.  Pt now has a Flexiseal for skin protection d/t the amount and frequency of her stools, C-Diff results positive, received during visit.  Loose cough noted during visit, pt c/o that her  throat was hurting, staff [name] Cindy in at end of visit to address pt's pain and anxiety.  Per chart review  has SLP consult and PMT consult. No family present at time of visit. [facility] will continue to monitor through discharge. Patient's home medication list and transfer summary in place  on shadow chart.   Please call [facility] @ [phone]- with any hospice needs.   Thank you. Hansel StarlingKaren E. [name], [name]  [facility]  Hospice Liaison  [phone])

## 2013-11-28 NOTE — Consult Note (Signed)
General Surgery [facility]  Patient seen.  Discussed with Will Marlyne BeardsJennings and his note reviewed and attached.  Discussed with Dr. Lindie SpruceWyatt, her surgeon.  No operative intervention planned for this unfortunate patient.  High risk for intra-abdominal procedure.  Recommend long-term use of nasal-enteric tube such as PANDA tube or nasogastric tube.  Likely will require placement under fluoroscopy.  Will sign off.  Velora Hecklerodd M. Marchelle Rinella, MD, [facility] Office: [phone]

## 2013-11-28 NOTE — Evaluation (Signed)
Clinical/Bedside Swallow Evaluation Patient Details  Name: Joan [name] MRN: [id number] Date of Birth: 10/10/1960  Today's Date: 11/28/2013 Time: 1200-1215 SLP Time Calculation (min): 15 min  Past Medical History:  Past Medical History  Diagnosis Date  . Peripheral neuropathy   . Fibromyalgia   . Hypertension   . Anxiety   . Catatonia   . Seizures    Past Surgical History:  Past Surgical History  Procedure Laterality Date  . Gastric bypass    . Abdominal hysterectomy    . Jejunostomy N/A 06/25/2013    Procedure:  OPEN JEJUNOSTOMY FEEDING TUBE ;  Surgeon: Cherylynn RidgesJames O [name], MD;  [facility];  Service: General;  Laterality: N/A;  . Laparotomy N/A 07/02/2013    Procedure: EXPLORATORY LAPAROTOMY with revision of jejunostomy feeding tube.;  Surgeon: Cherylynn RidgesJames O [name], MD;  [facility];  Service: General;  Laterality: N/A;  . Bowel resection N/A 07/02/2013    Procedure: SMALL BOWEL RESECTION;  Surgeon: Cherylynn RidgesJames O [name], MD;  Location: [facility] OR;  Service: General;  Laterality: N/A;   HPI:  53 y.o. female With history of seizure disorder, CJD per EMR notes, history of multiple displaced feeding tubes and NG tube (reportedly per notes patient not candidate for feeding tube placement), history of strokes, hypertension. Presenting to the ED after NG tube got dislodged.    Assessment / Plan / Recommendation Clinical Impression  Limited po trials provided given unknown severity of dysphagia. Reported by son, patient diagnosed with dysphagia in March at which time was deemed unsafe to eat and feeding tube placed. [facility] SLP [facility] that patient was only "screened" at [facility] and noted to be inappropriate for SLP intervention due to mentation. Today, patient with notable decreased oral strength with resultant dysarthria and oral transit delayed however with an overall functional appearing pharyngeal swallow with limited ice chip trials. Hyo-laryngeal elevation palpable,  excursion appearing decreased, however patient able to initiate a swallow with resultant clear vocal quality across trials. Did report mild odynophagia; ? impact of NG tube. At this time, palliative medicine beginning to follow. Although diagnosis does impact swallowing function, question if patient would be able to consume at least pos for pleasure given performance at bedside today. If patient has had instrumental testing of her swallowing (son unclear), it was prior to [facility] stay and it appears that patient has presented with some improvement since then based on son and [facility] reports. Discussed with Dr. Phillips OdorGolding who is currently reviewing chart. Tentative plans will be to complete MBS 7/3 to acheive new baseline of swallowing function and assist with goals of care.     Aspiration Risk   (unclear at this time, reported to have been severe)    Diet Recommendation NPO   Medication Administration: Via alternative means    Other  Recommendations Recommended Consults: MBS Oral Care Recommendations:  (QID)   Follow Up Recommendations          Pertinent Vitals/Pain n/a        Swallow Study    General HPI: 53 y.o. female With history of seizure disorder, CJD per EMR notes, history of multiple displaced feeding tubes and NG tube (reportedly per notes patient not candidate for feeding tube placement), history of strokes, hypertension. Presenting to the ED after NG tube got dislodged.  Type of Study: Bedside swallow evaluation Previous Swallow Assessment: not in cone system Diet Prior to this Study: NPO (NG tube) Temperature Spikes Noted: No Respiratory Status: Room air  History of Recent Intubation: No Behavior/Cognition: Alert;Cooperative;Pleasant mood;Confused Oral Cavity - Dentition: Adequate natural dentition Self-Feeding Abilities: Total assist Patient Positioning: Upright in bed Baseline Vocal Quality: Clear;Low vocal intensity Volitional Cough: Weak (severely) Volitional Swallow:  Able to elicit    Oral/Motor/Sensory Function Overall Oral Motor/Sensory Function: Impaired Labial ROM: Reduced left Labial Symmetry: Abnormal symmetry left Labial Strength: Reduced Labial Sensation: Within Functional Limits Lingual ROM: Within Functional Limits Lingual Symmetry: Within Functional Limits Lingual Strength: Reduced Lingual Sensation: Within Functional Limits Facial ROM: Reduced left Facial Symmetry: Within Functional Limits Facial Strength: Reduced Facial Sensation: Within Functional Limits Velum: Within Functional Limits Mandible: Within Functional Limits   Ice Chips Ice chips: Impaired Presentation: Spoon Oral Phase Impairments: Impaired anterior to posterior transit   Thin Liquid Thin Liquid: Not tested    Nectar Thick Nectar Thick Liquid: Not tested   Honey Thick Honey Thick Liquid: Not tested   Puree Puree: Not tested   Solid   GO   Joan Hastings MA, CCC-SLP [phone]  Solid: Not tested       Joan [name] Joan [name] 11/28/2013,12:55 PM

## 2013-11-28 NOTE — Consult Note (Signed)
Reason for Consult: Replace J tube. Referring Physician: Caren Griffins, MD   Joan [name] is an 53 y.o. female.  HPI: 53 y/o female with acute encephalopathy, convulsions/seizures, clostridial gastroenteritis, acute kidney injury, acute seizures,and UTI first seen in 12/13-29/2014. She was sent home on D1 diet.  A final diagnosis of Toxic metabolic encephalopathy with possible causes including sepsis, Crutchfield's Edison Nasuti disease, and possible psychiatric issues including; catatonia.  She was seen in the ED, 06/13/13 because she would not eat and ultimately sent home.  G tube placement was scheduled in late January, but because of her prior gastric bypass this could not be done.  IR placed a PICC line.  She was readmitted 06/19/13 for evaluation because of her lack of progress. She was placed on steroids and Plasmapheresis at that point.  She underwent ope Jejunostomy feeding tube placement on 06/25/13. This did not work well and she was returned to the OR on 07/02/13 and underwent:  EXPLORATORY LAPAROTOMY with revision of jejunostomy feeding tube. SMALL BOWEL RESECTION, Gwenyth Ober, MD.  Post op she had diarrhea from TF.  She was discharged on 07/16/13, and back to Ed 1 day later with multiple issues including disloged feeding tube.  She was discharged back to [facility] 07/24/13.  She has been sent to [facility]  And lost her tube there.  She is back in the hospital again after discharge from [facility] is following her.   On 10/08/13 Dr. Hulen Skains was ask to consider new J tube placement 3 weeks after the last one came out.  At this point the old tract  had healed over.  Dr. Hulen Skains said he would not operate on this patient again for this. She is readmitted for vomiting tube feeding from her mouth, dehydration and hypernatremia and we are ask to consider surgical replacement of the feeding jejunostomy tube again.  She has a nasal feeding tube that is functional.       Past Medical History  Diagnosis Date  Acute  encephalopathy of uncertain etiology   seizures   dysphagia   Recurrent C diff colitis   Peripheral neuropathy   Fibromyalgia   Hypertension   Anxiety   Catatonia   Seizures     Past Surgical History  Procedure Laterality Date  . Gastric bypass Gastric bypass reversed 2012 with gastric sleeve procedure 2010 2012   . Abdominal hysterectomy    . Jejunostomy N/A 06/25/2013    Procedure:  OPEN JEJUNOSTOMY FEEDING TUBE ;  Surgeon: Gwenyth Ober, MD;  Location: [facility];  Service: General;  Laterality: N/A;  . Laparotomy N/A 07/02/2013    Procedure: EXPLORATORY LAPAROTOMY with revision of jejunostomy feeding tube.;  Surgeon: Gwenyth Ober, MD;  Location: [facility];  Service: General;  Laterality: N/A;  . Bowel resection N/A 07/02/2013    Procedure: SMALL BOWEL RESECTION;  Surgeon: Gwenyth Ober, MD;  Location: [facility] OR;  Service: General;  Laterality: N/A;    Family History  Problem Relation Age of Onset  . Hypertension Mother   . Diabetes Father     Social History:  reports that she has never smoked. She has never used smokeless tobacco. She reports that she does not drink alcohol. Her drug history is not on file.  Allergies:  Allergies  Allergen Reactions  . Penicillins Other (See Comments), Rash and Shortness Of Breath    REACTION: Makes her body hurt.  . Prednisone Palpitations  . Citalopram Nausea And Vomiting  . Clindamycin Rash  and Other (See Comments)    GI upset  . Duloxetine Hcl Other (See Comments)    GI upset   . Escitalopram Nausea And Vomiting  . Hydromorphone Other (See Comments)    Generalized severe pain  . Morphine Other (See Comments)    Generalized severe pain  . Paroxetine Other (See Comments)    GI upset  . Sertraline Other (See Comments)    GI upset  . Sertraline Hcl Other (See Comments)    REACTION:  unknown  . Sulfamethoxazole-Trimethoprim Nausea And Vomiting  . Tape Dermatitis  . Clarithromycin Rash  . Doxycycline Rash and Other (See Comments)    GI  upset  . Metoclopramide Anxiety  . Morphine And Related Other (See Comments)    REACTION: Causes pain  . Paroxetine Hcl Other (See Comments)    REACTION:  unknown    Medications:  Prior to Admission:  Prescriptions prior to admission  Medication Sig Dispense Refill  . clonazePAM (KLONOPIN) 0.5 MG tablet Take 0.5 mg by mouth 3 (three) times daily as needed for anxiety. Via feeding tube      . lacosamide (VIMPAT) 200 MG TABS tablet Place 1 tablet (200 mg total) into feeding tube 2 (two) times daily.  60 tablet  0  . levETIRAcetam (KEPPRA) 1000 MG tablet Place 1.5 tablets (1,500 mg total) into feeding tube 2 (two) times daily.  60 tablet  0  . LORazepam (ATIVAN) 2 MG/ML injection Inject 0.25-0.5 mLs (0.5-1 mg total) into the vein every 6 (six) hours as needed for anxiety.  1 mL  0  . metoprolol (LOPRESSOR) 50 MG tablet Place 1 tablet (50 mg total) into feeding tube 2 (two) times daily.  75 tablet  0  . mirtazapine (REMERON SOL-TAB) 15 MG disintegrating tablet 1 tablet (15 mg total) by Per J Tube route at bedtime.      . pantoprazole (PROTONIX) 40 MG injection Inject 40 mg into the vein every 12 (twelve) hours.  1 each    . potassium chloride (KLOR-CON) 20 MEQ packet Take 20 mEq by mouth daily.      . risperiDONE (RISPERDAL) 0.5 MG tablet Take 0.5 mg by mouth 2 (two) times daily. Via feeding tube      . Valproic Acid (DEPAKENE) 250 MG/5ML SYRP syrup Take 15 mLs (750 mg total) by mouth every 8 (eight) hours.  600 mL  0  . Water For Irrigation, Sterile (FREE WATER) SOLN Place 200 mLs into feeding tube 4 (four) times daily.  200 mL  1   Scheduled: . antiseptic oral rinse  15 mL Mouth Rinse q12n4p  . chlorhexidine  15 mL Mouth Rinse BID  . ciprofloxacin  200 mg Intravenous Q12H  . enoxaparin  40 mg Subcutaneous Q24H  . free water  250 mL Per Tube QID  . lacosamide  200 mg Per Tube BID  . levETIRAcetam  1,500 mg Per Tube BID  . metoprolol tartrate  50 mg Per Tube BID  . mirtazapine  15 mg Per  J Tube QHS  . pantoprazole  40 mg Intravenous Q12H  . sodium chloride  3 mL Intravenous Q12H  . Valproic Acid  750 mg Oral 3 times per day  . vancomycin  125 mg Oral QID   Continuous: . dextrose 50 mL (11/27/13 2351)  . feeding supplement (OSMOLITE 1.5 CAL) 1,000 mL (11/28/13 1032)   WUX:[id number] (TYLENOL) oral liquid 160 mg/5 mL, LORazepam Anti-infectives   Start     Dose/Rate Route Frequency Ordered  Stop   11/28/13 1300  vancomycin (VANCOCIN) 50 mg/mL oral solution 125 mg     125 mg Oral 4 times daily 11/28/13 1119 12/12/13 1359   11/26/13 1800  ciprofloxacin (CIPRO) IVPB 200 mg     200 mg 100 mL/hr over 60 Minutes Intravenous Every 12 hours 11/26/13 1620        Results for orders placed during the hospital encounter of 11/25/13 (from the past 48 hour(s))  GLUCOSE, CAPILLARY     Status: Abnormal   Collection Time    11/26/13  5:10 PM      Result Value Ref Range   Glucose-Capillary 129 (*) 70 - 99 mg/dL  GLUCOSE, CAPILLARY     Status: None   Collection Time    11/26/13  8:35 PM      Result Value Ref Range   Glucose-Capillary 97  70 - 99 mg/dL   Comment 1 Notify RN    CBC     Status: Abnormal   Collection Time    11/27/13 12:00 AM      Result Value Ref Range   WBC 8.2  4.0 - 10.5 K/uL   RBC 3.67 (*) 3.87 - 5.11 MIL/uL   Hemoglobin 11.4 (*) 12.0 - 15.0 g/dL   HCT 34.5 (*) 36.0 - 46.0 %   MCV 94.0  78.0 - 100.0 fL   MCH 31.1  26.0 - 34.0 pg   MCHC 33.0  30.0 - 36.0 g/dL   RDW 13.9  11.5 - 15.5 %   Platelets 118 (*) 150 - 400 K/uL   Comment: REPEATED TO VERIFY     PLATELET COUNT CONFIRMED BY SMEAR     SPECIMEN CHECKED FOR CLOTS  BASIC METABOLIC PANEL     Status: Abnormal   Collection Time    11/27/13 12:10 AM      Result Value Ref Range   Sodium 139  137 - 147 mEq/L   Potassium 3.2 (*) 3.7 - 5.3 mEq/L   Comment: DELTA CHECK NOTED     REPEATED TO VERIFY   Chloride 100  96 - 112 mEq/L   CO2 23  19 - 32 mEq/L   Glucose, Bld 110 (*) 70 - 99 mg/dL   BUN 19  6  - 23 mg/dL   Creatinine, Ser 0.36 (*) 0.50 - 1.10 mg/dL   Calcium 8.3 (*) 8.4 - 10.5 mg/dL   GFR calc non Af Amer >90  >90 mL/min   GFR calc Af Amer >90  >90 mL/min   Comment: (NOTE)     The eGFR has been calculated using the CKD EPI equation.     This calculation has not been validated in all clinical situations.     eGFR's persistently <90 mL/min signify possible Chronic Kidney     Disease.  GLUCOSE, CAPILLARY     Status: Abnormal   Collection Time    11/27/13 12:11 AM      Result Value Ref Range   Glucose-Capillary 111 (*) 70 - 99 mg/dL   Comment 1 Notify RN    GLUCOSE, CAPILLARY     Status: None   Collection Time    11/27/13  4:59 AM      Result Value Ref Range   Glucose-Capillary 87  70 - 99 mg/dL   Comment 1 Notify RN    GLUCOSE, CAPILLARY     Status: None   Collection Time    11/27/13  8:17 AM      Result Value Ref Range  Glucose-Capillary 98  70 - 99 mg/dL   Comment 1 Documented in Chart     Comment 2 Notify RN    GLUCOSE, CAPILLARY     Status: Abnormal   Collection Time    11/27/13 12:18 PM      Result Value Ref Range   Glucose-Capillary 110 (*) 70 - 99 mg/dL  GLUCOSE, CAPILLARY     Status: Abnormal   Collection Time    11/27/13  4:55 PM      Result Value Ref Range   Glucose-Capillary 104 (*) 70 - 99 mg/dL   Comment 1 Documented in Chart     Comment 2 Notify RN    GLUCOSE, CAPILLARY     Status: None   Collection Time    11/27/13  8:25 PM      Result Value Ref Range   Glucose-Capillary 98  70 - 99 mg/dL   Comment 1 Notify RN    CBC     Status: Abnormal   Collection Time    11/28/13  4:18 AM      Result Value Ref Range   WBC 6.9  4.0 - 10.5 K/uL   RBC 3.76 (*) 3.87 - 5.11 MIL/uL   Hemoglobin 11.8 (*) 12.0 - 15.0 g/dL   HCT 35.4 (*) 36.0 - 46.0 %   MCV 94.1  78.0 - 100.0 fL   MCH 31.4  26.0 - 34.0 pg   MCHC 33.3  30.0 - 36.0 g/dL   RDW 14.0  11.5 - 15.5 %   Platelets 152  150 - 400 K/uL  BASIC METABOLIC PANEL     Status: Abnormal   Collection Time     11/28/13  4:18 AM      Result Value Ref Range   Sodium 138  137 - 147 mEq/L   Potassium 3.3 (*) 3.7 - 5.3 mEq/L   Chloride 100  96 - 112 mEq/L   CO2 28  19 - 32 mEq/L   Glucose, Bld 101 (*) 70 - 99 mg/dL   BUN 18  6 - 23 mg/dL   Creatinine, Ser 0.37 (*) 0.50 - 1.10 mg/dL   Calcium 8.6  8.4 - 10.5 mg/dL   GFR calc non Af Amer >90  >90 mL/min   GFR calc Af Amer >90  >90 mL/min   Comment: (NOTE)     The eGFR has been calculated using the CKD EPI equation.     This calculation has not been validated in all clinical situations.     eGFR's persistently <90 mL/min signify possible Chronic Kidney     Disease.   Anion gap 10  5 - 15  GLUCOSE, CAPILLARY     Status: Abnormal   Collection Time    11/28/13  4:37 AM      Result Value Ref Range   Glucose-Capillary 108 (*) 70 - 99 mg/dL   Comment 1 Notify RN    GLUCOSE, CAPILLARY     Status: Abnormal   Collection Time    11/28/13  7:53 AM      Result Value Ref Range   Glucose-Capillary 112 (*) 70 - 99 mg/dL   Comment 1 Documented in Chart     Comment 2 Notify RN    CLOSTRIDIUM DIFFICILE BY PCR     Status: Abnormal   Collection Time    11/28/13  8:18 AM      Result Value Ref Range   C difficile by pcr POSITIVE (*) NEGATIVE   Comment: CRITICAL  RESULT CALLED TO, READ BACK BY AND VERIFIED WITH:     Bertram Gala RN 10:55 11/28/13 ([email or web address])     Performed at [facility], CAPILLARY     Status: Abnormal   Collection Time    11/28/13 12:02 PM      Result Value Ref Range   Glucose-Capillary 115 (*) 70 - 99 mg/dL   Comment 1 Documented in Chart     Comment 2 Notify RN      No results found.  Review of Systems  Unable to perform ROS: mental acuity  Constitutional:       Pt does not really remember anything.   Blood pressure 111/55, pulse 97, temperature 98.3 F (36.8 C), temperature source Oral, resp. rate 18, height 5' 3"  (1.6 m), weight 70.6 kg (155 lb 10.3 oz), SpO2 100.00%. Physical Exam  Constitutional:  Chronically  ill female with some contracture, and mild tremor like activity upper right side.  Her speech is clear and she is answering appropriately what she can remember.  HENT:  Head: Normocephalic and atraumatic.  Panda type feeding tube in place  Eyes: Right eye exhibits no discharge. Left eye exhibits no discharge. No scleral icterus.  Cardiovascular: Normal rate, regular rhythm, normal heart sounds and intact distal pulses.   Respiratory: Effort normal and breath sounds normal. No respiratory distress. She has no wheezes. She has no rales. She exhibits no tenderness.  GI: Soft. Bowel sounds are normal. She exhibits no distension and no mass. There is no tenderness. There is no rebound and no guarding.  Well healed midline scar.  Musculoskeletal: She exhibits edema (trace). She exhibits no tenderness.  Neurological: She is alert.  Skin: Skin is warm and dry.  Psychiatric: She has a normal mood and affect.  She cannot remember what has occurred.    Assessment/Plan:  Acute encephalopathy of uncertain etiology  seizures  dysphagia  Recurrent C diff colitis  Peripheral neuropathy  Fibromyalgia  Hypertension  Anxiety  Catatonia  Seizures   Plan:  Patient is not a candidate for elective surgery.  We would recommend ongoing tube feeding via a Panda tube.  She has a repeat swallowing study tomorrow, but if that shows ongoing dysphagia we would recommend IR placement of Panda type feeding tube.   Joan [name] 11/28/2013, 3:43 PM

## 2013-11-28 NOTE — Consult Note (Addendum)
Palliative Medicine Team Consult Note  Spoke with patient's son Joan [name]. Joan [name] did not feel like a meeting was necessary to discuss his mother condition-he told me that he had already to talked to people including hospice. He just wanted to know what we were treating her for and when she was going to be sent home. I explained that her goals of care were not clear ie Code Status, NG tube, and I offered to answer questions and help moving forward- he agreed to speak with me on the phone.   She was released from a nursing home last month and has been at home with Hospice services. She has an NG tube in for medications and nutrition, according to son it has been technically impossible to get in a PEG tube because of prior gastric bypass.  The patient with progressive neurological (neuropsychiatric) disease. 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 She has periods of unusual behavior and neurological left sided weakness and her son reports that she "sleeps a lot". At times she appears overtly encephalopathy and at other times she can answer questions and actually called her son this morning independently per his report.  Active Issues:  C. diff Colitis- Needs treatment- need to secure parenteral route. NG tube/ Nutrition:  SLP saw and patient has been able to take some PO today-this is inconsistent  She has had an NG for 3 months after her J tube came out at [facility]- Surgery has not been willing to replace again based on complexity of her anatomy and terminal condition- however it is difficult to know what her prognosis is and family desire continued nutrition and she needs a stable route for oral meds. Consider asking surgery for consideration.  IV Ativan for agitation   Continue anti-seizure medications.  Patient now DNR, will update her goals of care.  [name] MaltaElizabeth Ghassan Coggeshall, DO Palliative Medicine [phone]

## 2013-11-28 NOTE — Progress Notes (Addendum)
PROGRESS NOTE  Joan [name] QMV:[id number] DOB: 10/20/1960 DOA: 11/25/2013 PCP: Karlene EinsteinASANAYAKA,[name], MD  Brief narrative: 53 y.o. female with seizure disorder, history of multiple displaced feeding tubes and NG tube (reportedly per notes patient not candidate for feeding tube placement), history of strokes, hypertension, [facility] tube got dislodged. Please note that most of the history was obtained from EMR and and ER physician as patient was unable to provide history given her limited responses based on her past medical history listed. Son reportedly, is caregiver but not available via phone or at bedside for further questioning regarding patient's history. While in the ED patient was found to have elevated sodium levels and lab work was consistent with dehydration. TRH asked to admit for further evaluation and management.    A/P  C diff colitis - started po Vancomycin, continue Flexiseal.  Progressive neurological disease - this is not clear as to the etiology, classification and prognosis. 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 I have kindly asked Neurology to see her again this time to evaluate; it seems like she has had an extensive workup in the past, also would appreciate a prognosis/natural course if possible. Palliative care following as well.   Complication of feeding tube - stable, per previous notes not a candidate for J tube replacement. Given unknown prognosis and life expectancy, this further complicates her clinical picture. She had a surgical J placed in February but now has an NG for the past 2.5 months. Patient is uncomfortable with that and is complaining of a sore throat and she has had multiple ED visits with NG tube dislodgement (and with the surgical J prior to that). I have kindly asked surgery if there are further options  which will also determine her prognosis. SLP evaluating as well but I do not think she will be alert enough every day to meet her nutritional needs.   Hypernatremia- due to dehydration, IVF provided and now resolved UTI - cultures with Klebsiella sensitive to Cipro Dehydration - IVF provided and clinically more euvolemic this AM. Resume feeds. Essential hypertension, benign - reasonable inpatient control  Leukocytosis - from UTI and dehydration, continue Cipro Seizure - continue AED  Consultants:  Palliative care  Neurology  Surgery  Procedures/Studies:  Dg Chest 1 View  11/25/2013   No definite acute cardiopulmonary process of the lung apices obscured by facial structures.    Dg Abd 1 View  11/25/2013 Nasogastric tube tip projects in proximal stomach.  Nonspecific bowel gas pattern, partially imaged.     Dg Abd 1 View  11/25/2013   The nasogastric tube is no longer visible in the abdomen.     Antibiotics:  Cipro IV 6/30 -->   Code Status: Full Family Communication: No family at bedside  Disposition Plan: Remains inpatient   HPI/Subjective: No events overnight.   Objective: Filed Vitals:   11/26/13 0549 11/27/13 0548 11/27/13 2040 11/28/13 0638  BP:  113/73 115/60 105/59  Pulse:  102 95 99  Temp:  97.9 F (36.6 C) 97.8 F (36.6 C) 98.6 F (37 C)  TempSrc:  Oral Oral Oral  Resp:  16 16 20   Height:      Weight: 70.126 kg (154 lb 9.6 oz) 70.262 kg (154 lb 14.4 oz)  70.6 kg (155 lb 10.3 oz)  SpO2:  100% 100% 100%  Intake/Output Summary (Last 24 hours) at 11/28/13 1530 Last data filed at 11/28/13 1610  Gross per 24 hour  Intake      0 ml  Output      0 ml  Net      0 ml    Exam:   General:  Pt is somnolent but easy to arouse, non verbal with me, not following any commands   Cardiovascular: Regular rate and rhythm, S1/S2, no murmurs, no rubs, no gallops  Respiratory: Clear to auscultation bilaterally, no wheezing, diminished breath sounds at bases    Abdomen: Soft, non tender, non distended, bowel sounds present, no guarding  Data Reviewed: Basic Metabolic Panel:  Recent Labs Lab 11/25/13 0604 11/25/13 1030 11/26/13 0455 11/27/13 0010 11/28/13 0418  NA 156*  --  139 139 138  K 4.1  --  4.0 3.2* 3.3*  CL 113*  --  103 100 100  CO2 28  --  25 23 28   GLUCOSE 110*  --  99 110* 101*  BUN 42*  --  29* 19 18  CREATININE 0.51  --  0.37* 0.36* 0.37*  CALCIUM 9.7  --  8.8 8.3* 8.6  MG  --  2.0  --   --   --   PHOS  --  4.1  --   --   --    Liver Function Tests:  Recent Labs Lab 11/26/13 0455  AST 35  ALT 12  ALKPHOS 62  BILITOT 0.6  PROT 6.1  ALBUMIN 2.5*   CBC:  Recent Labs Lab 11/25/13 0604 11/26/13 0455 11/27/13 11/28/13 0418  WBC 11.2* 10.1 8.2 6.9  NEUTROABS 9.3*  --   --   --   HGB 14.5 12.5 11.4* 11.8*  HCT 46.4* 38.5 34.5* 35.4*  MCV 98.7 95.5 94.0 94.1  PLT 121* 101* 118* 152   CBG:  Recent Labs Lab 11/27/13 1655 11/27/13 2025 11/28/13 0437 11/28/13 0753 11/28/13 1202  GLUCAP 104* 98 108* 112* 115*    Recent Results (from the past 240 hour(s))  URINE CULTURE     Status: None   Collection Time    11/25/13  6:22 AM      Result Value Ref Range Status   Specimen Description URINE, CATHETERIZED   Final   Special Requests NONE   Final   Culture  Setup Time     Final   Value: 11/25/2013 11:43     Performed at [facility] Count     Final   Value: >=100,000 COLONIES/ML     Performed at [facility]   Culture     Final   Value: KLEBSIELLA PNEUMONIAE     Performed at [facility]   Report Status 11/27/2013 FINAL   Final   Organism ID, Bacteria KLEBSIELLA PNEUMONIAE   Final  CLOSTRIDIUM DIFFICILE BY PCR     Status: Abnormal   Collection Time    11/28/13  8:18 AM      Result Value Ref Range Status   C difficile by pcr POSITIVE (*) NEGATIVE Final   Comment: CRITICAL RESULT CALLED TO, READ BACK BY AND VERIFIED WITH:     CAndi Devon RN 10:55 11/28/13 ([email or web address])      Performed at [facility]     Scheduled Meds: . antiseptic oral rinse  15 mL Mouth Rinse q12n4p  . chlorhexidine  15 mL Mouth Rinse BID  . ciprofloxacin  200 mg Intravenous Q12H  . enoxaparin  40  mg Subcutaneous Q24H  . free water  250 mL Per Tube QID  . lacosamide  200 mg Per Tube BID  . levETIRAcetam  1,500 mg Per Tube BID  . metoprolol tartrate  50 mg Per Tube BID  . mirtazapine  15 mg Per J Tube QHS  . pantoprazole  40 mg Intravenous Q12H  . sodium chloride  3 mL Intravenous Q12H  . Valproic Acid  750 mg Oral 3 times per day  . vancomycin  125 mg Oral QID   Continuous Infusions: . dextrose 50 mL (11/27/13 2351)  . feeding supplement (OSMOLITE 1.5 CAL) 1,000 mL (11/28/13 1032)   Pamella PertGHERGHE, Sreenidhi Ganson, MD, PhD Pager [phone]  If 7PM-7AM, please contact night-coverage [email or web address] Password [email or web address] 11/28/2013, 3:30 PM  Time spent: 35 minutes   LOS: 3 days

## 2013-11-28 NOTE — Evaluation (Addendum)
Physical Therapy Evaluation Patient Details Name: Joan [name] MRN: [id number] DOB: 07/03/1960 Today's Date: 11/28/2013   History of Present Illness  53 y.o. female With history of seizure disorder, CJD per EMR notes, history of multiple displaced feeding tubes and NG tube (reportedly per notes patient not candidate for feeding tube placement), history of strokes, hypertension. Presenting to the ED after NG tube got dislodged.  Clinical Impression  **Pt admitted with *hypernatremia, dislodged feeding tube*. Pt currently with functional limitations due to the deficits listed below (see PT Problem List).  Pt will benefit from skilled PT to increase their independence and safety with mobility to allow discharge to the venue listed below.   Pt requires 2 person total assist for supine to sit, mod/max assist for sitting balance. She is disoriented, stated she's in a hotel and that she can walk. At present she requires 24* total assist. Unclear what her prior functional level is as she is a poor historian and no family present. BUEs are grossly nonfunctional due to contractures/weakness, B feet also contracted. Will follow on 2x/week trial basis.   *    Follow Up Recommendations SNF;Supervision/Assistance - 24 hour    Equipment Recommendations  Hospital bed    Recommendations for Other Services       Precautions / Restrictions Precautions Precautions: Fall Precaution Comments: rectal tube Restrictions Weight Bearing Restrictions: No      Mobility  Bed Mobility Overal bed mobility: +2 for physical assistance;Needs Assistance Bed Mobility: Supine to Sit     Supine to sit: +2 for physical assistance;Total assist     General bed mobility comments: total assist (pt 5%) for supine to sit  Transfers                 General transfer comment: NT  Ambulation/Gait                Stairs            Wheelchair Mobility    Modified Rankin (Stroke Patients Only)        Balance Overall balance assessment: Needs assistance Sitting-balance support: Feet supported Sitting balance-Leahy Scale: Zero Sitting balance - Comments: pt sat on EOB x 5 min with mod to max support due to posterior lean Postural control: Posterior lean;Right lateral lean                                   Pertinent Vitals/Pain *pt reported unrated R sided chest pain RN notified, pain meds requested**    Home Living Family/patient expects to be discharged to:: Unsure                 Additional Comments: pt with inconsistent responses, poor historian, unsure what living situation is    Prior Function Level of Independence: Needs assistance         Comments: pt not oriented to place or situation, she thinks she's at a hotel, stated she can walk     Hand Dominance        Extremity/Trunk Assessment   Upper Extremity Assessment: RUE deficits/detail;LUE deficits/detail RUE Deficits / Details: R hand, elbow, shoulder with very limited PROM, pt stated this is due to CVA, no functional movement     LUE Deficits / Details: shoulder flexion PROM to 35*, hand non functional   Lower Extremity Assessment: RLE deficits/detail;LLE deficits/detail RLE Deficits / Details: ankle 1/5, knee -2/5, hip -2/5; ankle contracted in  plantar flexion LLE Deficits / Details: ankle 1/5, knee -2/5, hip -2/5; ankle contracted in plantar flexion  Cervical / Trunk Assessment: Normal  Communication   Communication: No difficulties  Cognition Arousal/Alertness: Awake/alert Behavior During Therapy: WFL for tasks assessed/performed Overall Cognitive Status: No family/caregiver present to determine baseline cognitive functioning (not oriented to place or situation, poor historian)                      General Comments      Exercises General Exercises - Lower Extremity Long Arc QuadBarbaraann Boys: AAROM;Both;Seated;10 reps Heel Slides: AAROM;Both;5 reps;Supine Hip  ABduction/ADduction: AAROM;Both;5 reps;Supine      Assessment/Plan    PT Assessment Patient needs continued PT services  PT Diagnosis Generalized weakness   PT Problem List Decreased strength;Decreased range of motion;Decreased activity tolerance;Decreased balance;Decreased cognition;Decreased mobility  PT Treatment Interventions Functional mobility training;Therapeutic activities;Balance training;Therapeutic exercise   PT Goals (Current goals can be found in the Care Plan section) Acute Rehab PT Goals Patient Stated Goal: to walk PT Goal Formulation: With patient Time For Goal Achievement: 12/12/13 Potential to Achieve Goals: Fair    Frequency Min 2X/week   Barriers to discharge        Co-evaluation               End of Session   Activity Tolerance: Patient limited by fatigue Patient left: in bed;with call bell/phone within reach;with bed alarm set Nurse Communication: Mobility status;Need for lift equipment         Time: [id number] PT Time Calculation (min): 19 min   Charges:   PT Evaluation $Initial PT Evaluation Tier I: 1 Procedure PT Treatments $Therapeutic Activity: 8-22 mins   PT G Codes:          Tamala SerUhlenberg, Guhan Bruington Kistler 11/28/2013, 9:53 AM [phone]

## 2013-11-29 ENCOUNTER — Inpatient Hospital Stay (HOSPITAL_COMMUNITY)

## 2013-11-29 LAB — GLUCOSE, CAPILLARY
GLUCOSE-CAPILLARY: 107 mg/dL — AB (ref 70–99)
GLUCOSE-CAPILLARY: 96 mg/dL (ref 70–99)
Glucose-Capillary: 110 mg/dL — ABNORMAL HIGH (ref 70–99)
Glucose-Capillary: 122 mg/dL — ABNORMAL HIGH (ref 70–99)
Glucose-Capillary: 118 mg/dL — ABNORMAL HIGH (ref 70–99)
Glucose-Capillary: 99 mg/dL (ref 70–99)

## 2013-11-29 LAB — BASIC METABOLIC PANEL
Anion gap: 12 (ref 5–15)
BUN: 12 mg/dL (ref 6–23)
CALCIUM: 8.7 mg/dL (ref 8.4–10.5)
CO2: 28 mEq/L (ref 19–32)
Chloride: 100 mEq/L (ref 96–112)
Creatinine, Ser: 0.35 mg/dL — ABNORMAL LOW (ref 0.50–1.10)
Glucose, Bld: 137 mg/dL — ABNORMAL HIGH (ref 70–99)
Potassium: 3.3 mEq/L — ABNORMAL LOW (ref 3.7–5.3)
Sodium: 140 mEq/L (ref 137–147)

## 2013-11-29 LAB — CBC
HCT: 35.4 % — ABNORMAL LOW (ref 36.0–46.0)
HEMOGLOBIN: 11.5 g/dL — AB (ref 12.0–15.0)
MCH: 30.6 pg (ref 26.0–34.0)
MCHC: 32.5 g/dL (ref 30.0–36.0)
MCV: 94.1 fL (ref 78.0–100.0)
PLATELETS: 149 10*3/uL — AB (ref 150–400)
RBC: 3.76 MIL/uL — AB (ref 3.87–5.11)
RDW: 13.8 % (ref 11.5–15.5)
WBC: 6.1 10*3/uL (ref 4.0–10.5)

## 2013-11-29 MED ORDER — POTASSIUM CHLORIDE 20 MEQ/15ML (10%) PO LIQD
40.0000 meq | Freq: Once | ORAL | Status: AC
  Administered 2013-11-29: 40 meq via ORAL
  Filled 2013-11-29: qty 30

## 2013-11-29 MED ORDER — CLONAZEPAM 0.1 MG/ML ORAL SUSPENSION: 0.5000 mg | Freq: Two times a day (BID) | ORAL | Status: DC

## 2013-11-29 MED ORDER — RISPERIDONE 1 MG PO TABS
1.0000 mg | ORAL_TABLET | Freq: Two times a day (BID) | ORAL | Status: DC
  Administered 2013-11-29 – 2013-12-04 (×11): 1 mg
  Filled 2013-11-29 (×12): qty 1

## 2013-11-29 MED ORDER — RISPERIDONE 1 MG PO TABS: 1.0000 mg | ORAL_TABLET | Freq: Two times a day (BID) | ORAL | Status: DC

## 2013-11-29 MED ORDER — CLONAZEPAM 0.5 MG PO TBDP
0.5000 mg | ORAL_TABLET | Freq: Two times a day (BID) | ORAL | Status: DC
  Administered 2013-11-29 – 2013-12-04 (×11): 0.5 mg
  Filled 2013-11-29 (×11): qty 1

## 2013-11-29 NOTE — Procedures (Signed)
Objective Swallowing Evaluation: Modified Barium Swallowing Study  Patient Details  Name: Joan [name] MRN: [id number] Date of Birth: 08/29/1960  Today's Date: 11/29/2013 Time: [id number] SLP Time Calculation (min): 25 min  Past Medical History:  Past Medical History  Diagnosis Date  . Peripheral neuropathy   . Fibromyalgia   . Hypertension   . Anxiety   . Catatonia   . Seizures    Past Surgical History:  Past Surgical History  Procedure Laterality Date  . Gastric bypass    . Abdominal hysterectomy    . Jejunostomy N/A 06/25/2013    Procedure:  OPEN JEJUNOSTOMY FEEDING TUBE ;  Surgeon: Cherylynn RidgesJames O [name], MD;  [facility];  Service: General;  Laterality: N/A;  . Laparotomy N/A 07/02/2013    Procedure: EXPLORATORY LAPAROTOMY with revision of jejunostomy feeding tube.;  Surgeon: Cherylynn RidgesJames O [name], MD;  [facility];  Service: General;  Laterality: N/A;  . Bowel resection N/A 07/02/2013    Procedure: SMALL BOWEL RESECTION;  Surgeon: Cherylynn RidgesJames O [name], MD;  Location: [facility] OR;  Service: General;  Laterality: N/A;   HPI:  53 y.o. female With history of seizure disorder, CJD per EMR notes, history of multiple displaced feeding tubes and NG tube (reportedly per notes patient not candidate for feeding tube placement), history of strokes, hypertension. Presenting to the ED after NG tube got dislodged.  Swallow evaluation completed yesterday and MD ordered MBS.       Assessment / Plan / Recommendation Clinical Impression  Dysphagia Diagnosis: Moderate oral phase dysphagia;Severe oral phase dysphagia;Mild pharyngeal phase dysphagia  Clinical impression:  Moderately severe and mild pharyngeal dysphagia with sensorimotor deficits.  Oral dysphagia characterized by weakness resulting lingual pumping and significant delay in oral transiting with boluses essentially spilling into pharynx uncontrolled.  Verbal cues for pt to swallow were inconsistently helpful to elicit oral transiting and pt with poor labial seal  on straw negatively impacting suction ability.  She had a gag response when attempting to transit pudding therefore SLP requested pt to expectorate (which she did).  In addition, she did not attempt to masticate small piece of cracker.     Pharyngeal swallow characterized by delayed pharyngeal reflex with liquids spilling to pyriform sinus and accumulating for up to 4 seconds before swallow reflex.  Minimal amount of vallecular residuals noted but pt did not follow directions consistently to swallow.  Using video monitor, SLP provided pt with feedback and education throughout testing.    Although pt without aspiration or penetration of any consistency tested, her level of dysphagia will prevent her from consuming adequate nutrition po.   She is also at high aspiration risk due to delay in reflexive swallow response and grossly cough negatively impacting airway protection.   Given pt is a hospice pt, SLP questions if she would find pleasure in small amounts of full liquids only (eg icecream, ensures, all thin drinks) for pleasure.    SLP to follow up for pt/family education and dysphagia treatment.     Treatment Recommendation  Therapy as outlined in treatment plan below    Diet Recommendation  (? small amount of full liquids for comfort only with strict precautions)   Liquid Administration via: Spoon;Cup Medication Administration: Via alternative means Supervision:  (pt will require someone to feed her) Compensations: Slow rate;Small sips/bites Postural Changes and/or Swallow Maneuvers: Seated upright 90 degrees;Upright 30-60 min after meal    Other  Recommendations Oral Care Recommendations: Oral care BID   Follow Up Recommendations  LTACH  Frequency and Duration min 2x/week  2 weeks   Pertinent Vitals/Pain Afebrile, decreased      General HPI: 53 y.o. female With history of seizure disorder, CJD per EMR notes, history of multiple displaced feeding tubes and NG tube (reportedly  per notes patient not candidate for feeding tube placement), history of strokes, hypertension. Presenting to the ED after NG tube got dislodged.  Swallow evaluation completed yesterday and MD ordered MBS.   Type of Study: Modified Barium Swallowing Study Reason for Referral: Objectively evaluate swallowing function Previous Swallow Assessment: not in cone system Diet Prior to this Study:  (smaller bore feeding tube) Temperature Spikes Noted: No Respiratory Status: Room air History of Recent Intubation: No Behavior/Cognition: Alert;Cooperative;Pleasant mood;Confused;Requires cueing;Decreased sustained attention (delayed responses) Oral Cavity - Dentition: Adequate natural dentition Oral Motor / Sensory Function: Impaired - see Bedside swallow eval Self-Feeding Abilities: Total assist Patient Positioning: Upright in chair Baseline Vocal Quality: Clear;Low vocal intensity Volitional Cough: Weak (severely) Volitional Swallow: Able to elicit Anatomy: Within functional limits Pharyngeal Secretions: Not observed secondary MBS    Reason for Referral Objectively evaluate swallowing function   Oral Phase Oral Preparation/Oral Phase Oral Phase: Impaired Oral - Nectar Oral - Nectar Teaspoon: Lingual pumping;Delayed oral transit;Reduced posterior propulsion;Weak lingual manipulation Oral - Nectar Straw: Lingual pumping;Delayed oral transit;Piecemeal swallowing;Weak lingual manipulation Oral - Thin Oral - Thin Teaspoon: Lingual pumping;Delayed oral transit;Piecemeal swallowing;Reduced posterior propulsion;Weak lingual manipulation Oral - Thin Cup: Lingual pumping;Delayed oral transit;Holding of bolus;Reduced posterior propulsion;Weak lingual manipulation Oral - Thin Straw: Lingual pumping;Delayed oral transit;Holding of bolus;Piecemeal swallowing;Weak lingual manipulation Oral - Solids Oral - Puree: Lingual pumping;Holding of bolus;Delayed oral transit;Reduced posterior propulsion;Weak lingual  manipulation Oral - Regular: Holding of bolus;Impaired mastication;Weak lingual manipulation   Pharyngeal Phase Pharyngeal Phase Pharyngeal Phase: Impaired Pharyngeal - Nectar Pharyngeal - Nectar Teaspoon: Delayed swallow initiation;Premature spillage to pyriform sinuses;Pharyngeal residue - valleculae;Pharyngeal residue - pyriform sinuses Pharyngeal - Nectar Straw: Premature spillage to pyriform sinuses;Delayed swallow initiation;Pharyngeal residue - valleculae;Pharyngeal residue - pyriform sinuses Pharyngeal - Thin Pharyngeal - Thin Teaspoon: Premature spillage to pyriform sinuses;Delayed swallow initiation;Pharyngeal residue - valleculae;Pharyngeal residue - pyriform sinuses Pharyngeal - Thin Cup: Premature spillage to pyriform sinuses;Delayed swallow initiation;Pharyngeal residue - valleculae;Pharyngeal residue - pyriform sinuses Pharyngeal - Thin Straw: Premature spillage to pyriform sinuses;Delayed swallow initiation;Pharyngeal residue - valleculae;Pharyngeal residue - pyriform sinuses Pharyngeal - Solids Pharyngeal - Puree: Not tested (pt gagged and therefore SLP requested pt expectorate pudding ) Pharyngeal - Regular: Not tested (pt did not attempt to masticate cracker - SLP removed) Pharyngeal Phase - Comment Pharyngeal Comment: verbal cues for pt to swallow were inconsistently effective   Cervical Esophageal Phase    GO    Cervical Esophageal Phase Cervical Esophageal Phase: WFL (upon esophageal sweep - esophagus appeared clear, radiologist not present)         Donavan Burnetamara Adean Milosevic, MS [facility] SLP [phone]

## 2013-11-29 NOTE — Progress Notes (Signed)
PROGRESS NOTE  Joan PeelingLora [name] ZOX:[id number] DOB: 11/15/1960 DOA: 11/25/2013 PCP: Karlene EinsteinASANAYAKA,[name], MD  Brief narrative: 53 y.o. female with seizure disorder, history of multiple displaced feeding tubes and NG tube (reportedly per notes patient not candidate for feeding tube placement), history of strokes, hypertension, [facility] tube got [facility]lod[facility]. Please note that most of the history was obtained from EMR and and ER physician as patient was unable to provide history given her limited responses based on her past medical history listed. Son reportedly, is caregiver but not available via phone or at bedside for further questioning regarding patient's history. While in the ED patient was found to have elevated sodium levels and lab work was consistent with dehydration. TRH asked to admit for further evaluation and management.    A/P  C diff colitis - started po Vancomycin.  - continues to have significant diarrhea, continue Flexiseal  Progressive neurological disease - this is not clear as to the etiology, classification and prognosis. 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  - For routine care, there are no indicated precautions and the risk occurs when having brain surgery or other invasive procedures.  - If this patient is scheduled for EEG, a procedure in IR, OR, Neuro OR, or expires, contact Infection Prevention utilizing the 24 hour availability number [phone]) for additional guidance.   Complication of feeding tube - stable, per previous notes not a candidate for J tube replacement. Given unknown prognosis and life expectancy, this further complicates her clinical picture. She had a surgical J placed in February but now  has an NG for the past 2.5 months. Patient is uncomfortable with that and is complaining of a sore throat and she has had multiple ED visits with NG tube dislodgement (and with the surgical J prior to that).  - surgery evaluated patient on 7/2, she is not a surgical candidate  Hypernatremia- due to dehydration, IVF provided and now resolved UTI - cultures with Klebsiella sensitive to Cipro Dehydration - IVF provided and clinically more euvolemic this AM. Resume feeds. Essential hypertension, benign - reasonable inpatient control  Leukocytosis - from UTI and dehydration, continue Cipro Seizure - continue AED  Consultants:  Palliative care  Neurology  Surgery  Procedures/Studies:  Dg Chest 1 View  11/25/2013   No definite acute cardiopulmonary process of the lung apices obscured by facial structures.    Dg Abd 1 View  11/25/2013 Nasogastric tube tip projects in proximal stomach.  Nonspecific bowel gas pattern, partially imaged.     Dg Abd 1 View  11/25/2013   The nasogastric tube is no longer visible in the abdomen.     Antibiotics:  Cipro IV 6/30 -->   Po Vancomycin 7/2 >>  Code Status: Full Family Communication: son at bedside  Disposition Plan: Remains inpatient   HPI/Subjective: No events overnight.   Objective: Filed Vitals:   11/28/13 0638 11/28/13 1536 11/28/13 2014 11/29/13 0410  BP: 105/59 111/55 110/61 136/53  Pulse: 99 97 104 125  Temp: 98.6 F (37 C) 98.3 F (36.8 C) 98.4 F (36.9 C) 97.8 F (36.6 C)  TempSrc: Oral Oral Oral Oral  Resp: 20 18 20 22   Height:      Weight: 70.6 kg (155 lb  10.3 oz)   70.806 kg (156 lb 1.6 oz)  SpO2: 100% 100% 100% 100%    Intake/Output Summary (Last 24 hours) at 11/29/13 1319 Last data filed at 11/28/13 2015  Gross per 24 hour  Intake      0 ml  Output      0 ml  Net      0 ml    Exam:   General:  Pt is somnolent but easy to arouse, non verbal with me, not following any commands   Cardiovascular: Regular rate  and rhythm, S1/S2, no murmurs, no rubs, no gallops  Respiratory: Clear to auscultation bilaterally, no wheezing, diminished breath sounds at bases   Abdomen: Soft, non tender, non distended, bowel sounds present, no guarding  Data Reviewed: Basic Metabolic Panel:  Recent Labs Lab 11/25/13 0604 11/25/13 1030 11/26/13 0455 11/27/13 0010 11/28/13 0418 11/29/13 0437  NA 156*  --  139 139 138 140  K 4.1  --  4.0 3.2* 3.3* 3.3*  CL 113*  --  103 100 100 100  CO2 28  --  25 23 28 28   GLUCOSE 110*  --  99 110* 101* 137*  BUN 42*  --  29* 19 18 12   CREATININE 0.51  --  0.37* 0.36* 0.37* 0.35*  CALCIUM 9.7  --  8.8 8.3* 8.6 8.7  MG  --  2.0  --   --   --   --   PHOS  --  4.1  --   --   --   --    Liver Function Tests:  Recent Labs Lab 11/26/13 0455  AST 35  ALT 12  ALKPHOS 62  BILITOT 0.6  PROT 6.1  ALBUMIN 2.5*   CBC:  Recent Labs Lab 11/25/13 0604 11/26/13 0455 11/27/13 11/28/13 0418 11/29/13 0437  WBC 11.2* 10.1 8.2 6.9 6.1  NEUTROABS 9.3*  --   --   --   --   HGB 14.5 12.5 11.4* 11.8* 11.5*  HCT 46.4* 38.5 34.5* 35.4* 35.4*  MCV 98.7 95.5 94.0 94.1 94.1  PLT 121* 101* 118* 152 149*   CBG:  Recent Labs Lab 11/28/13 2012 11/29/13 0029 11/29/13 0407 11/29/13 0733 11/29/13 1227  GLUCAP 101* 96 110* 122* 118*    Recent Results (from the past 240 hour(s))  URINE CULTURE     Status: None   Collection Time    11/25/13  6:22 AM      Result Value Ref Range Status   Specimen Description URINE, CATHETERIZED   Final   Special Requests NONE   Final   Culture  Setup Time     Final   Value: 11/25/2013 11:43     Performed at [facility]   Colony Count     Final   Value: >=100,000 COLONIES/ML     Performed at [facility]   Culture     Final   Value: KLEBSIELLA PNEUMONIAE     Performed at [facility]   Report Status 11/27/2013 FINAL   Final   Organism ID, Bacteria KLEBSIELLA PNEUMONIAE   Final  CLOSTRIDIUM DIFFICILE BY PCR      Status: Abnormal   Collection Time    11/28/13  8:18 AM      Result Value Ref Range Status   C difficile by pcr POSITIVE (*) NEGATIVE Final   Comment: CRITICAL RESULT CALLED TO, READ BACK BY AND VERIFIED WITH:     Roslyn Smiling. HUGHEY RN 10:55 11/28/13 ([email or web address])  Performed at [facility]     Scheduled Meds: . antiseptic oral rinse  15 mL Mouth Rinse q12n4p  . chlorhexidine  15 mL Mouth Rinse BID  . ciprofloxacin  200 mg Intravenous Q12H  . clonazepam  0.5 mg Per Tube BID  . enoxaparin  40 mg Subcutaneous Q24H  . free water  250 mL Per Tube QID  . lacosamide  200 mg Per Tube BID  . levETIRAcetam  1,500 mg Per Tube BID  . metoprolol tartrate  50 mg Per Tube BID  . mirtazapine  15 mg Per J Tube QHS  . pantoprazole  40 mg Intravenous Q12H  . risperiDONE  1 mg Per Tube BID  . sodium chloride  3 mL Intravenous Q12H  . Valproic Acid  750 mg Oral 3 times per day  . vancomycin  125 mg Oral QID   Continuous Infusions: . dextrose 50 mL/hr at 11/29/13 0122  . feeding supplement (OSMOLITE 1.5 CAL) 1,000 mL (11/29/13 4098)   Pamella Pert, MD, PhD Pager [phone]  If 7PM-7AM, please contact night-coverage [email or web address] Password [email or web address] 11/29/2013, 1:19 PM  Time spent: 35 minutes   LOS: 4 days

## 2013-11-29 NOTE — Progress Notes (Addendum)
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 Pt is Full Code. Of note per chart review pt is now DNR.   Pt seen at bedside, alert,  just back from radiology where she completed a swallow study. Results still pending. Pt c/o of her throat hurting. Staff RN Joan BattenKim made aware. Emotional support offered. Per chart review there are no plans for any surgery and pt will remain with NG tube for feeding/medications. Pt continues on abt for C-diff, rectal tube remains. No family present at time of visit. Writer spoke to attending Md Dr. Elvera LennoxGherghe and PMT MD Dr. Phillips OdorGolding regarding changing pt's medications home medications from tablet to liquid form, both aware of need d/t risk of occlusion of NG tube with med administration. [facility] will continue to follow through discharge.  Patient's home medication list and transfer summary in place on shadow chart.   Please call [facility] @ [phone] with any hospice needs.   Thank you. Joan StarlingKaren E. [name], RN  [facility]  Hospice Liaison  [phone])

## 2013-11-30 LAB — GLUCOSE, CAPILLARY
GLUCOSE-CAPILLARY: 98 mg/dL (ref 70–99)
GLUCOSE-CAPILLARY: 115 mg/dL — AB (ref 70–99)
Glucose-Capillary: 102 mg/dL — ABNORMAL HIGH (ref 70–99)
Glucose-Capillary: 89 mg/dL (ref 70–99)
Glucose-Capillary: 94 mg/dL (ref 70–99)
Glucose-Capillary: 95 mg/dL (ref 70–99)

## 2013-11-30 LAB — CBC
HEMATOCRIT: 34.2 % — AB (ref 36.0–46.0)
Hemoglobin: 11.1 g/dL — ABNORMAL LOW (ref 12.0–15.0)
MCH: 31.4 pg (ref 26.0–34.0)
MCHC: 32.5 g/dL (ref 30.0–36.0)
MCV: 96.6 fL (ref 78.0–100.0)
Platelets: 127 10*3/uL — ABNORMAL LOW (ref 150–400)
RBC: 3.54 MIL/uL — ABNORMAL LOW (ref 3.87–5.11)
RDW: 14.4 % (ref 11.5–15.5)
WBC: 6.2 10*3/uL (ref 4.0–10.5)

## 2013-11-30 LAB — BASIC METABOLIC PANEL
ANION GAP: 9 (ref 5–15)
BUN: 10 mg/dL (ref 6–23)
CO2: 30 mEq/L (ref 19–32)
Calcium: 9 mg/dL (ref 8.4–10.5)
Chloride: 103 mEq/L (ref 96–112)
Creatinine, Ser: 0.36 mg/dL — ABNORMAL LOW (ref 0.50–1.10)
GFR calc Af Amer: >90 mL/min (ref >90)
Glucose, Bld: 108 mg/dL — ABNORMAL HIGH (ref 70–99)
Potassium: 4 mEq/L (ref 3.7–5.3)
Sodium: 142 mEq/L (ref 137–147)

## 2013-11-30 NOTE — Progress Notes (Signed)
SLP Cancellation Note  Patient Details Name: Candida PeelingLora Weipert MRN: [id number] DOB: 06/27/1960   Cancelled treatment:       Reason Eval/Treat Not Completed:  (SLP spoke to MD inquiring re: plan.  He reports neuro plans for furhter diagnostics and therefore MD did not desire pt to have liquid po for comfort at this time.  SLP to follow up next week. )   Mills KollerKimball, Beza Steppe Ann Tyvon Eggenberger, MS [facility] SLP [phone]

## 2013-11-30 NOTE — Progress Notes (Signed)
PROGRESS NOTE  Joan PeelingLora [name] HYQ:[id number] DOB: 06/06/1960 DOA: 11/25/2013 PCP: Karlene EinsteinASANAYAKA,[name], MD  Brief narrative: 53 y.o. female with seizure disorder, history of multiple displaced feeding tubes and NG tube (reportedly per notes patient not candidate for feeding tube placement), history of strokes, hypertension, [facility] after NG tube got dislodged. Please note that most of the history was obtained from EMR and and ER physician as patient was unable to provide history given her limited responses based on her past medical history listed. Son reportedly, is caregiver but not available via phone or at bedside for further questioning regarding patient's history. While in the ED patient was found to have elevated sodium levels and lab work was consistent with dehydration. TRH asked to admit for further evaluation and management.    A/P  C diff colitis - started po Vancomycin.  - continues to have significant diarrhea, continue Flexiseal  Progressive neurological disease - this is not clear as to the etiology, classification and prognosis. 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   Complication of feeding tube - stable, per previous notes not a candidate for J tube replacement. Given unknown prognosis and life expectancy, this further complicates her clinical picture. She had a surgical J placed in February but now has an NG for the past 2.5 months. Patient is uncomfortable with that and is complaining of a sore throat and she has had multiple ED visits with NG tube dislodgement (and with the surgical J prior to that).  - surgery evaluated patient on 7/2 for PEG/surgical J, she is not a surgical candidate Hypernatremia- due to dehydration, IVF provided and now resolved UTI - cultures with Klebsiella sensitive to Cipro Dehydration - IVF provided and clinically more euvolemic this  AM. Resume feeds. Essential hypertension, benign - reasonable inpatient control  Leukocytosis - from UTI and dehydration, continue Cipro Seizure - continue AED  Consultants:  Palliative care  Neurology  Surgery  Procedures/Studies:  Dg Chest 1 View  11/25/2013   No definite acute cardiopulmonary process of the lung apices obscured by facial structures.    Dg Abd 1 View  11/25/2013 Nasogastric tube tip projects in proximal stomach.  Nonspecific bowel gas pattern, partially imaged.     Dg Abd 1 View  11/25/2013   The nasogastric tube is no longer visible in the abdomen.     Antibiotics:  Cipro IV 6/30 -->   Po Vancomycin 7/2 >>  Code Status: Full Family Communication: son at bedside  Disposition Plan: Remains inpatient   HPI/Subjective: No events overnight.   Objective: Filed Vitals:   11/29/13 0410 11/29/13 1340 11/29/13 2147 11/30/13 0439  BP: 136/53 124/68 129/66 117/61  Pulse: 125 92 101 99  Temp: 97.8 F (36.6 C) 97.7 F (36.5 C) 98.1 F (36.7 C) 98.3 F (36.8 C)  TempSrc: Oral Oral Oral Oral  Resp: 22 20 22 22   Height:      Weight: 70.806 kg (156 lb 1.6 oz)   70.811 kg (156 lb 1.8 oz)  SpO2: 100% 100% 100% 100%    Intake/Output Summary (Last 24 hours) at 11/30/13 1153 Last data filed at 11/30/13 0400  Gross per 24 hour  Intake   3450 ml  Output    850 ml  Net   2600 ml    Exam:   General:  Pt is somnolent but easy to arouse, non verbal with me, not following any  commands   Cardiovascular: Regular rate and rhythm, S1/S2, no murmurs, no rubs, no gallops  Respiratory: Clear to auscultation bilaterally, no wheezing, diminished breath sounds at bases   Abdomen: Soft, non tender, non distended, bowel sounds present, no guarding  Data Reviewed: Basic Metabolic Panel:  Recent Labs Lab 11/25/13 1030 11/26/13 0455 11/27/13 0010 11/28/13 0418 11/29/13 0437 11/30/13 0516  NA  --  139 139 138 140 142  K  --  4.0 3.2* 3.3* 3.3* 4.0  CL  --  103  100 100 100 103  CO2  --  25 23 28 28 30   GLUCOSE  --  99 110* 101* 137* 108*  BUN  --  29* 19 18 12 10   CREATININE  --  0.37* 0.36* 0.37* 0.35* 0.36*  CALCIUM  --  8.8 8.3* 8.6 8.7 9.0  MG 2.0  --   --   --   --   --   PHOS 4.1  --   --   --   --   --    Liver Function Tests:  Recent Labs Lab 11/26/13 0455  AST 35  ALT 12  ALKPHOS 62  BILITOT 0.6  PROT 6.1  ALBUMIN 2.5*   CBC:  Recent Labs Lab 11/25/13 0604 11/26/13 0455 11/27/13 11/28/13 0418 11/29/13 0437 11/30/13 0516  WBC 11.2* 10.1 8.2 6.9 6.1 6.2  NEUTROABS 9.3*  --   --   --   --   --   HGB 14.5 12.5 11.4* 11.8* 11.5* 11.1*  HCT 46.4* 38.5 34.5* 35.4* 35.4* 34.2*  MCV 98.7 95.5 94.0 94.1 94.1 96.6  PLT 121* 101* 118* 152 149* 127*   CBG:  Recent Labs Lab 11/29/13 1711 11/29/13 2008 11/30/13 0009 11/30/13 0437 11/30/13 0803  GLUCAP 99 107* 95 98 94    Recent Results (from the past 240 hour(s))  URINE CULTURE     Status: None   Collection Time    11/25/13  6:22 AM      Result Value Ref Range Status   Specimen Description URINE, CATHETERIZED   Final   Special Requests NONE   Final   Culture  Setup Time     Final   Value: 11/25/2013 11:43     Performed at [facility] Count     Final   Value: >=100,000 COLONIES/ML     Performed at [facility]   Culture     Final   Value: KLEBSIELLA PNEUMONIAE     Performed at [facility]   Report Status 11/27/2013 FINAL   Final   Organism ID, Bacteria KLEBSIELLA PNEUMONIAE   Final  CLOSTRIDIUM DIFFICILE BY PCR     Status: Abnormal   Collection Time    11/28/13  8:18 AM      Result Value Ref Range Status   C difficile by pcr POSITIVE (*) NEGATIVE Final   Comment: CRITICAL RESULT CALLED TO, READ BACK BY AND VERIFIED WITH:     CAndi Devon RN 10:55 11/28/13 ([email or web address])     Performed at [facility]     Scheduled Meds: . antiseptic oral rinse  15 mL Mouth Rinse q12n4p  . chlorhexidine  15 mL Mouth Rinse BID  .  ciprofloxacin  200 mg Intravenous Q12H  . clonazepam  0.5 mg Per Tube BID  . enoxaparin  40 mg Subcutaneous Q24H  . free water  250 mL Per Tube QID  . lacosamide  200 mg Per Tube BID  .  levETIRAcetam  1,500 mg Per Tube BID  . metoprolol tartrate  50 mg Per Tube BID  . mirtazapine  15 mg Per J Tube QHS  . pantoprazole  40 mg Intravenous Q12H  . risperiDONE  1 mg Per Tube BID  . sodium chloride  3 mL Intravenous Q12H  . Valproic Acid  750 mg Oral 3 times per day  . vancomycin  125 mg Oral QID   Continuous Infusions: . dextrose 50 mL/hr at 11/29/13 1834  . feeding supplement (OSMOLITE 1.5 CAL) 1,000 mL (11/30/13 0602)   Pamella PertGHERGHE, Celise Bazar, MD, PhD Pager [phone]  If 7PM-7AM, please contact night-coverage [email or web address] Password [email or web address] 11/30/2013, 11:53 AM  Time spent: 25 minutes   LOS: 5 days

## 2013-11-30 NOTE — Progress Notes (Signed)
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 Pt is Full Code. Of note per chart review pt is now DNR. Patient is resting comfortably, tube feedings infusing through NG tube. No family at bedside. Nurse states that patient's son is caregiver at home but at hospital it takes two people to assist with cleaning.   Please call [facility] @ [phone] with any hospice needs.  Thank you. Ulis RiasErin Osborne, RN

## 2013-12-01 LAB — BASIC METABOLIC PANEL
ANION GAP: 11 (ref 5–15)
BUN: 11 mg/dL (ref 6–23)
CHLORIDE: 100 meq/L (ref 96–112)
CO2: 29 meq/L (ref 19–32)
CREATININE: 0.38 mg/dL — AB (ref 0.50–1.10)
Calcium: 9 mg/dL (ref 8.4–10.5)
GFR calc Af Amer: >90 mL/min (ref >90)
GFR calc non Af Amer: >90 mL/min (ref >90)
Glucose, Bld: 101 mg/dL — ABNORMAL HIGH (ref 70–99)
Potassium: 4.3 mEq/L (ref 3.7–5.3)
Sodium: 140 mEq/L (ref 137–147)

## 2013-12-01 LAB — CBC
HCT: 36.6 % (ref 36.0–46.0)
HEMOGLOBIN: 11.6 g/dL — AB (ref 12.0–15.0)
MCH: 30.8 pg (ref 26.0–34.0)
MCHC: 31.7 g/dL (ref 30.0–36.0)
MCV: 97.1 fL (ref 78.0–100.0)
Platelets: 144 10*3/uL — ABNORMAL LOW (ref 150–400)
RBC: 3.77 MIL/uL — ABNORMAL LOW (ref 3.87–5.11)
RDW: 14.6 % (ref 11.5–15.5)
WBC: 7.6 10*3/uL (ref 4.0–10.5)

## 2013-12-01 LAB — GLUCOSE, CAPILLARY
GLUCOSE-CAPILLARY: 99 mg/dL (ref 70–99)
Glucose-Capillary: 101 mg/dL — ABNORMAL HIGH (ref 70–99)
Glucose-Capillary: 107 mg/dL — ABNORMAL HIGH (ref 70–99)
Glucose-Capillary: 110 mg/dL — ABNORMAL HIGH (ref 70–99)
Glucose-Capillary: 115 mg/dL — ABNORMAL HIGH (ref 70–99)
Glucose-Capillary: 98 mg/dL (ref 70–99)
Glucose-Capillary: 99 mg/dL (ref 70–99)

## 2013-12-01 MED ORDER — METRONIDAZOLE IN NACL 5-0.79 MG/ML-% IV SOLN
500.0000 mg | Freq: Three times a day (TID) | INTRAVENOUS | Status: DC
  Administered 2013-12-01 – 2013-12-04 (×10): 500 mg via INTRAVENOUS
  Filled 2013-12-01 (×12): qty 100

## 2013-12-01 NOTE — Progress Notes (Signed)
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 Pt is now DNR. Patient resting comfortably with tube feedings infusing through NG tube. Chare Nurse reports patient pulled NG tube out today and it was replaced by her primary nurse Joan [name]. patient also tried to pull IV out.   Please call [facility] @ [phone] with any hospice needs.  Thank you. Ulis RiasErin Osborne, RN

## 2013-12-01 NOTE — Progress Notes (Signed)
PROGRESS NOTE  Joan [name] ZOX:[id number] DOB: 1960/08/03 DOA: 11/25/2013 PCP: [name], MD  Brief narrative: 53 y.o. female with seizure disorder, history of multiple displaced feeding tubes and NG tube (reportedly per notes patient not candidate for feeding tube placement), history of strokes, hypertension, presented to [facility] ED after NG tube got dislodged. Please note that most of the history was obtained from EMR and and ER physician as patient was unable to provide history given her limited responses based on her past medical history listed. Son reportedly, is caregiver but not available via phone or at bedside for further questioning regarding patient's history. While in the ED patient was found to have elevated sodium levels and lab work was consistent with dehydration. TRH asked to admit for further evaluation and management.    A/P  C diff colitis - started po Vancomycin.  - continues to have significant diarrhea, continue Flexiseal - add IV metronidazole today   Progressive neurological disease - this is not clear as to the etiology, classification and prognosis. 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   Complication of feeding tube - stable, per previous notes not a candidate for J tube replacement. Given unknown prognosis and life expectancy, this further complicates her clinical picture. She had a surgical J placed in February but now has an NG for the past 2.5 months. Patient is uncomfortable with that and is complaining of a sore throat and she has had multiple ED visits with NG tube dislodgement (and with the surgical J prior to that).  - surgery evaluated patient on 7/2 for PEG/surgical J, she is not a surgical candidate Hypernatremia- due to dehydration, IVF provided and now resolved UTI - cultures with Klebsiella sensitive to Cipro Dehydration - IVF provided and  clinically more euvolemic this AM. Resume feeds. Essential hypertension, benign - reasonable inpatient control  Leukocytosis - from UTI and dehydration, continue Cipro Seizure - continue AED  Consultants:  Palliative care  Neurology  Surgery  Procedures/Studies:  Dg Chest 1 View  11/25/2013   No definite acute cardiopulmonary process of the lung apices obscured by facial structures.    Dg Abd 1 View  11/25/2013 Nasogastric tube tip projects in proximal stomach.  Nonspecific bowel gas pattern, partially imaged.     Dg Abd 1 View  11/25/2013   The nasogastric tube is no longer visible in the abdomen.     Antibiotics:  Cipro IV 6/30 -->   Po Vancomycin 7/2 >>  Code Status: Full Family Communication: son at bedside  Disposition Plan: Remains inpatient, home with hospice once diarrhea better  HPI/Subjective: No events overnight, asking to go home  Objective: Filed Vitals:   11/30/13 0439 11/30/13 1634 11/30/13 2001 12/01/13 0449  BP: 117/61 100/62 105/55 129/66  Pulse: 99 84 106 113  Temp: 98.3 F (36.8 C) 98.9 F (37.2 C) 98.6 F (37 C) 98.3 F (36.8 C)  TempSrc: Oral Axillary Oral Oral  Resp: 22 20 22 22   Height:      Weight: 70.811 kg (156 lb 1.8 oz)   72.621 kg (160 lb 1.6 oz)  SpO2: 100% 100% 100% 100%    Intake/Output Summary (Last 24 hours) at 12/01/13 0859 Last data filed at 11/30/13 1800  Gross per 24 hour  Intake    700 ml  Output      0 ml  Net    700 ml    Exam:  General:  Alert, confused, asking to go home  Cardiovascular: Regular rate and rhythm, S1/S2, no murmurs, no rubs, no gallops  Respiratory: Clear to auscultation bilaterally, no wheezing, diminished breath sounds at bases   Abdomen: Soft, non tender, non distended, bowel sounds present, no guarding  Data Reviewed: Basic Metabolic Panel:  Recent Labs Lab 11/25/13 1030  11/27/13 0010 11/28/13 0418 11/29/13 0437 11/30/13 0516 12/01/13 0420  NA  --   < > 139 138 140 142 140    K  --   < > 3.2* 3.3* 3.3* 4.0 4.3  CL  --   < > 100 100 100 103 100  CO2  --   < > 23 28 28 30 29   GLUCOSE  --   < > 110* 101* 137* 108* 101*  BUN  --   < > 19 18 12 10 11   CREATININE  --   < > 0.36* 0.37* 0.35* 0.36* 0.38*  CALCIUM  --   < > 8.3* 8.6 8.7 9.0 9.0  MG 2.0  --   --   --   --   --   --   PHOS 4.1  --   --   --   --   --   --   < > = values in this interval not displayed. Liver Function Tests:  Recent Labs Lab 11/26/13 0455  AST 35  ALT 12  ALKPHOS 62  BILITOT 0.6  PROT 6.1  ALBUMIN 2.5*   CBC:  Recent Labs Lab 11/25/13 0604  11/27/13 11/28/13 0418 11/29/13 0437 11/30/13 0516 12/01/13 0420  WBC 11.2*  < > 8.2 6.9 6.1 6.2 7.6  NEUTROABS 9.3*  --   --   --   --   --   --   HGB 14.5  < > 11.4* 11.8* 11.5* 11.1* 11.6*  HCT 46.4*  < > 34.5* 35.4* 35.4* 34.2* 36.6  MCV 98.7  < > 94.0 94.1 94.1 96.6 97.1  PLT 121*  < > 118* 152 149* 127* 144*  < > = values in this interval not displayed. CBG:  Recent Labs Lab 11/30/13 1748 11/30/13 1959 12/01/13 0046 12/01/13 0447 12/01/13 0817  GLUCAP 102* 89 115* 99 101*    Recent Results (from the past 240 hour(s))  URINE CULTURE     Status: None   Collection Time    11/25/13  6:22 AM      Result Value Ref Range Status   Specimen Description URINE, CATHETERIZED   Final   Special Requests NONE   Final   Culture  Setup Time     Final   Value: 11/25/2013 11:43     Performed at [facility]   Colony Count     Final   Value: >=100,000 COLONIES/ML     Performed at [facility]   Culture     Final   Value: KLEBSIELLA PNEUMONIAE     Performed at [facility]   Report Status 11/27/2013 FINAL   Final   Organism ID, Bacteria KLEBSIELLA PNEUMONIAE   Final  CLOSTRIDIUM DIFFICILE BY PCR     Status: Abnormal   Collection Time    11/28/13  8:18 AM      Result Value Ref Range Status   C difficile by pcr POSITIVE (*) NEGATIVE Final   Comment: CRITICAL RESULT CALLED TO, READ BACK BY AND  VERIFIED WITH:     Roslyn Smiling. HUGHEY RN 10:55 11/28/13 ([email or web address])     [facility]formed at  [facility]     Scheduled Meds: . antiseptic oral rinse  15 mL Mouth Rinse q12n4p  . chlorhexidine  15 mL Mouth Rinse BID  . ciprofloxacin  200 mg Intravenous Q12H  . clonazepam  0.5 mg Per Tube BID  . enoxaparin  40 mg Subcutaneous Q24H  . free water  250 mL Per Tube QID  . lacosamide  200 mg Per Tube BID  . levETIRAcetam  1,500 mg Per Tube BID  . metoprolol tartrate  50 mg Per Tube BID  . mirtazapine  15 mg Per J Tube QHS  . pantoprazole  40 mg Intravenous Q12H  . risperiDONE  1 mg Per Tube BID  . sodium chloride  3 mL Intravenous Q12H  . Valproic Acid  750 mg Oral 3 times per day  . vancomycin  125 mg Oral QID   Continuous Infusions: . dextrose 50 mL/hr at 11/29/13 1834  . feeding supplement (OSMOLITE 1.5 CAL) 1,000 mL (12/01/13 0400)   Pamella PertGHERGHE, Camila Maita, MD, PhD Pager [phone]  If 7PM-7AM, please contact night-coverage [email or web address] Password [email or web address] 12/01/2013, 8:59 AM  Time spent: 15 minutes   LOS: 6 days

## 2013-12-02 ENCOUNTER — Other Ambulatory Visit (HOSPITAL_COMMUNITY): Payer: Self-pay

## 2013-12-02 DIAGNOSIS — G9340 Encephalopathy, unspecified: Secondary | ICD-10-CM

## 2013-12-02 DIAGNOSIS — E870 Hyperosmolality and hypernatremia: Secondary | ICD-10-CM

## 2013-12-02 DIAGNOSIS — E872 Acidosis, unspecified: Secondary | ICD-10-CM

## 2013-12-02 LAB — GLUCOSE, CAPILLARY
GLUCOSE-CAPILLARY: 101 mg/dL — AB (ref 70–99)
GLUCOSE-CAPILLARY: 114 mg/dL — AB (ref 70–99)
GLUCOSE-CAPILLARY: 116 mg/dL — AB (ref 70–99)
GLUCOSE-CAPILLARY: 108 mg/dL — AB (ref 70–99)
Glucose-Capillary: 98 mg/dL (ref 70–99)

## 2013-12-02 MED ORDER — HYDRALAZINE HCL 10 MG PO TABS
10.0000 mg | ORAL_TABLET | Freq: Four times a day (QID) | ORAL | Status: DC
  Filled 2013-12-02 (×5): qty 1

## 2013-12-02 MED ORDER — HYDRALAZINE HCL 10 MG PO TABS
10.0000 mg | ORAL_TABLET | Freq: Four times a day (QID) | ORAL | Status: DC
  Administered 2013-12-02 – 2013-12-04 (×4): 10 mg
  Filled 2013-12-02 (×13): qty 1

## 2013-12-02 NOTE — Progress Notes (Signed)
PT Cancellation Note  ___Treatment cancelled today due to medical issues with patient which prohibited therapy  ___ Treatment cancelled today due to patient receiving procedure or test   ___ Treatment cancelled today due to patient's refusal to participate   _X_ Treatment cancelled today due to pt unable to participate   Felecia ShellingLori Jaedyn Lard  PTA [facility]  Acute  Rehab Pager      [phone]

## 2013-12-02 NOTE — Progress Notes (Signed)
On call provider notified of vs BP 150/105 with HR 115.

## 2013-12-02 NOTE — Progress Notes (Signed)
PROGRESS NOTE  Joan [name] GNF:[id number] DOB: Apr 03, 1961 DOA: 11/25/2013 PCP: [name], MD  Brief narrative: 53 y.o. female with seizure disorder, history of multiple displaced feeding tubes and NG tube (reportedly per notes patient not candidate for feeding tube placement), history of strokes, hypertension, presented to [facility] ED after NG tube got dislodged. Please note that most of the history was obtained from EMR and and ER physician as patient was unable to provide history given her limited responses based on her past medical history listed. Son reportedly, is caregiver but not available via phone or at bedside for further questioning regarding patient's history. While in the ED patient was found to have elevated sodium levels and lab work was consistent with dehydration. TRH asked to admit for further evaluation and management.    A/P  C diff colitis - started po Vancomycin.  - continues to have significant diarrhea, continue Flexiseal - add IV metronidazole 7/5, diarrhea seems to have slowed down, remove flexiseal today and consider d/c tomorrow.   Progressive neurological disease - this is not clear as to the etiology, classification and prognosis. 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   Complication of feeding tube - stable, per previous notes not a candidate for J tube replacement. Given unknown prognosis and life expectancy, this further complicates her clinical picture. She had a surgical J placed in February but now has an NG for the past 2.5 months. Patient is uncomfortable with that and is complaining of a sore throat and she has had multiple ED visits with NG tube dislodgement (and with the surgical J prior to that).  - surgery evaluated patient on 7/2 for PEG/surgical J, she is not a surgical candidate - called IR today regarding ways to secure the NG tube to prevent  accidental removal, appreciate input  Hypernatremia- due to dehydration, IVF provided and now resolved UTI - cultures with Klebsiella sensitive to Cipro Dehydration - IVF provided and clinically more euvolemic this AM. Resume feeds. Essential hypertension, benign - reasonable inpatient control  Leukocytosis - from UTI and dehydration, continue Cipro, today last day, will d/c tomorrow Seizure - continue AED  Consultants:  Palliative care  Neurology  Surgery  IR  Procedures/Studies:  Dg Chest 1 View  11/25/2013   No definite acute cardiopulmonary process of the lung apices obscured by facial structures.    Dg Abd 1 View  11/25/2013 Nasogastric tube tip projects in proximal stomach.  Nonspecific bowel gas pattern, partially imaged.     Dg Abd 1 View  11/25/2013   The nasogastric tube is no longer visible in the abdomen.     Antibiotics:  Cipro IV 6/30 -->   Po Vancomycin 7/2 >>  Code Status: Full Family Communication: son at bedside  Disposition Plan: Remains inpatient, home with hospice once diarrhea better  HPI/Subjective: NG tube had to be replaced overnight per RN  Objective: Filed Vitals:   12/01/13 0449 12/01/13 1427 12/01/13 2208 12/02/13 0447  BP: 129/66 122/78 129/101 150/105  Pulse: 113 88 109 115  Temp: 98.3 F (36.8 C) 98.6 F (37 C) 98.5 F (36.9 C) 98.7 F (37.1 C)  TempSrc: Oral Oral Oral Oral  Resp: 22 16 15 22   Height:      Weight: 72.621 kg (160 lb 1.6 oz)     SpO2: 100% 100% 100% 100%    Intake/Output Summary (Last 24 hours) at 12/02/13 1115 Last data  filed at 12/02/13 0620  Gross per 24 hour  Intake 2733.34 ml  Output    750 ml  Net 1983.34 ml    Exam:  General:  Alert, confused, asking to go home  Cardiovascular: Regular rate and rhythm, S1/S2, no murmurs, no rubs, no gallops  Respiratory: Clear to auscultation bilaterally, no wheezing, diminished breath sounds at bases   Abdomen: Soft, non tender, non distended, bowel sounds  present, no guarding  Data Reviewed: Basic Metabolic Panel:  Recent Labs Lab 11/27/13 0010 11/28/13 0418 11/29/13 0437 11/30/13 0516 12/01/13 0420  NA 139 138 140 142 140  K 3.2* 3.3* 3.3* 4.0 4.3  CL 100 100 100 103 100  CO2 23 28 28 30 29   GLUCOSE 110* 101* 137* 108* 101*  BUN 19 18 12 10 11   CREATININE 0.36* 0.37* 0.35* 0.36* 0.38*  CALCIUM 8.3* 8.6 8.7 9.0 9.0   Liver Function Tests:  Recent Labs Lab 11/26/13 0455  AST 35  ALT 12  ALKPHOS 62  BILITOT 0.6  PROT 6.1  ALBUMIN 2.5*   CBC:  Recent Labs Lab 11/27/13 11/28/13 0418 11/29/13 0437 11/30/13 0516 12/01/13 0420  WBC 8.2 6.9 6.1 6.2 7.6  HGB 11.4* 11.8* 11.5* 11.1* 11.6*  HCT 34.5* 35.4* 35.4* 34.2* 36.6  MCV 94.0 94.1 94.1 96.6 97.1  PLT 118* 152 149* 127* 144*   CBG:  Recent Labs Lab 12/01/13 1717 12/01/13 1958 12/01/13 2345 12/02/13 0407 12/02/13 0740  GLUCAP 110* 98 107* 98 101*    Recent Results (from the past 240 hour(s))  URINE CULTURE     Status: None   Collection Time    11/25/13  6:22 AM      Result Value Ref Range Status   Specimen Description URINE, CATHETERIZED   Final   Special Requests NONE   Final   Culture  Setup Time     Final   Value: 11/25/2013 11:43     Performed at [facility]   Colony Count     Final   Value: >=100,000 COLONIES/ML     Performed at [facility]   Culture     Final   Value: KLEBSIELLA PNEUMONIAE     Performed at [facility]   Report Status 11/27/2013 FINAL   Final   Organism ID, Bacteria KLEBSIELLA PNEUMONIAE   Final  CLOSTRIDIUM DIFFICILE BY PCR     Status: Abnormal   Collection Time    11/28/13  8:18 AM      Result Value Ref Range Status   C difficile by pcr POSITIVE (*) NEGATIVE Final   Comment: CRITICAL RESULT CALLED TO, READ BACK BY AND VERIFIED WITH:     Andi Devon. HUGHEY RN 10:55 11/28/13 ([email or web address])     Performed at [facility]     Scheduled Meds: . antiseptic oral rinse  15 mL Mouth Rinse q12n4p  .  chlorhexidine  15 mL Mouth Rinse BID  . ciprofloxacin  200 mg Intravenous Q12H  . clonazepam  0.5 mg Per Tube BID  . enoxaparin  40 mg Subcutaneous Q24H  . free water  250 mL Per Tube QID  . hydrALAZINE  10 mg Per Tube 4 times per day  . lacosamide  200 mg Per Tube BID  . levETIRAcetam  1,500 mg Per Tube BID  . metoprolol tartrate  50 mg Per Tube BID  . metronidazole  500 mg Intravenous Q8H  . mirtazapine  15 mg Per J Tube QHS  . risperiDONE  1 mg Per Tube BID  . sodium chloride  3 mL Intravenous Q12H  . Valproic Acid  750 mg Oral 3 times per day  . vancomycin  125 mg Oral QID   Continuous Infusions: . dextrose 50 mL/hr at 12/01/13 1735  . feeding supplement (OSMOLITE 1.5 CAL) 1,000 mL (12/01/13 0400)   Pamella PertGHERGHE, Savalas Monje, MD, PhD Pager [phone]  If 7PM-7AM, please contact night-coverage [email or web address] Password [email or web address] 12/02/2013, 11:15 AM  Time spent: 15 minutes   LOS: 7 days

## 2013-12-02 NOTE — Progress Notes (Signed)
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  Pt is DNR  code.    Pt seen at bedside, asleep, no nonverbal s/s of pain noted. G-tube in place to R nares, tube feed running at 50cc/hr. Writer spoke with staff RN Ene, no concerns voiced. Rectal tube has been removed.  Per chart review pt has received a dose of PRN acetaminophen for "aching pain" prior to writers arrival. Also per chart review pt to have an EEG today. Per attending note patient may d/c tomorrow. [facility] will continue to monitor through discharge.  Patient's home medication list and transfer summary in place on shadow chart.   Please call [facility] @ [phone] with any hospice needs.   Thank you. Hansel StarlingKaren E. [name], RN  [facility]  Hospice Liaison  [phone])

## 2013-12-03 ENCOUNTER — Inpatient Hospital Stay (HOSPITAL_COMMUNITY): Admit: 2013-12-03 | Discharge: 2013-12-03 | Disposition: A | Discharge status: Home / Self Care

## 2013-12-03 ENCOUNTER — Inpatient Hospital Stay (HOSPITAL_COMMUNITY)

## 2013-12-03 DIAGNOSIS — G9340 Encephalopathy, unspecified: Secondary | ICD-10-CM

## 2013-12-03 LAB — GLUCOSE, CAPILLARY
GLUCOSE-CAPILLARY: 91 mg/dL (ref 70–99)
GLUCOSE-CAPILLARY: 96 mg/dL (ref 70–99)
GLUCOSE-CAPILLARY: 99 mg/dL (ref 70–99)
Glucose-Capillary: 90 mg/dL (ref 70–99)
Glucose-Capillary: 86 mg/dL (ref 70–99)
Glucose-Capillary: 95 mg/dL (ref 70–99)

## 2013-12-03 LAB — BASIC METABOLIC PANEL
ANION GAP: 12 (ref 5–15)
BUN: 11 mg/dL (ref 6–23)
CALCIUM: 8.8 mg/dL (ref 8.4–10.5)
CHLORIDE: 100 meq/L (ref 96–112)
CO2: 25 mEq/L (ref 19–32)
CREATININE: 0.34 mg/dL — AB (ref 0.50–1.10)
GFR calc Af Amer: >90 mL/min (ref >90)
GFR calc non Af Amer: >90 mL/min (ref >90)
GLUCOSE: 79 mg/dL (ref 70–99)
Potassium: 4.1 mEq/L (ref 3.7–5.3)
Sodium: 137 mEq/L (ref 137–147)

## 2013-12-03 MED ORDER — SODIUM CHLORIDE 0.9 % IV BOLUS (SEPSIS)
1000.0000 mL | Freq: Once | INTRAVENOUS | Status: AC
  Administered 2013-12-03: 1000 mL via INTRAVENOUS

## 2013-12-03 MED ORDER — SODIUM CHLORIDE 0.9 % IV SOLN
INTRAVENOUS | Status: DC
  Administered 2013-12-03: 17:00:00 via INTRAVENOUS

## 2013-12-03 NOTE — Progress Notes (Addendum)
PROGRESS NOTE  Joan [name] AVW:[id number] DOB: 12/21/1960 DOA: 11/25/2013 PCP: Karlene EinsteinASANAYAKA,[name], MD  Brief narrative: 53 y.o. female with seizure disorder, history of multiple displaced feeding tubes and NG tube (reportedly per notes patient not candidate for feeding tube placement), history of strokes, hypertension, [facility] after NG tube got dislodged. Please note that most of the history was obtained from EMR and and ER physician as patient was unable to provide history given her limited responses based on her past medical history listed. Son reportedly, is caregiver but not available via phone or at bedside for further questioning regarding patient's history. While in the ED patient was found to have elevated sodium levels and lab work was consistent with dehydration. TRH asked to admit for further evaluation and management.    A/P  C diff colitis - started po Vancomycin.  - continues to have significant diarrhea, continue Flexiseal - add IV metronidazole 7/5, diarrhea seems to have slowed down, removed flexiseal 7/6 - ?ongoing dehydration, hypotensive 7/7, bolus 1L, continue to monitor.    Progressive neurological disease - this is not clear as to the etiology, classification and prognosis. 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  - will also obtain MRI  Complication of feeding tube - stable, per previous notes not a candidate for J tube replacement. Given unknown prognosis and life expectancy, this further complicates her clinical picture. She had a surgical J placed in February but now has an NG for the past 2.5 months. Patient is uncomfortable with that and is complaining of a sore throat and she has had multiple ED visits with NG tube dislodgement (and with the surgical J prior to that).  - surgery evaluated patient on 7/2 for PEG/surgical J, she is  not a surgical candidate - called IR today regarding ways to secure the NG tube to prevent accidental removal, there are no good ways. - while may use a post pyloric tube, this will not prevent dislodging   Hypernatremia- due to dehydration, IVF provided and now resolved Klebsiella UTI - cultures with Klebsiella sensitive to Cipro, now s/p 7 days of treatment, Cipro discontinued on 7/7 Dehydration - IVF provided and clinically more euvolemic this AM. Resume feeds. Essential hypertension, benign - reasonable inpatient control  Leukocytosis - from UTI and dehydration, continue Cipro, today last day, will d/c tomorrow Seizure - continue AED  Consultants:  Palliative care  Neurology  Surgery  IR  Procedures/Studies:  Dg Chest 1 View  11/25/2013   No definite acute cardiopulmonary process of the lung apices obscured by facial structures.    Dg Abd 1 View  11/25/2013 Nasogastric tube tip projects in proximal stomach.  Nonspecific bowel gas pattern, partially imaged.     Dg Abd 1 View  11/25/2013   The nasogastric tube is no longer visible in the abdomen.     Antibiotics:  Cipro IV 6/30 -->   Po Vancomycin 7/2 >>  Code Status: Full Family Communication: none today Disposition Plan: Remains inpatient, home with hospice   HPI/Subjective: NG tube had to be replaced overnight per RN  Objective: Filed Vitals:   12/02/13 1859 12/02/13 2006 12/03/13 0023 12/03/13 0449  BP: 92/40 102/55 129/104 104/49  Pulse:  109  95  Temp:  98.2 F (36.8 C)  98.4 F (36.9 C)  TempSrc:  Oral  Oral  Resp:  22  22  Height:  Weight:    75.524 kg (166 lb 8 oz)  SpO2:  100%  100%    Intake/Output Summary (Last 24 hours) at 12/03/13 0933 Last data filed at 12/03/13 0600  Gross per 24 hour  Intake 3114.16 ml  Output    150 ml  Net 2964.16 ml    Exam:  General:  Alert, confused, asking to go home  Cardiovascular: Regular rate and rhythm, S1/S2, no murmurs, no rubs, no  gallops  Respiratory: Clear to auscultation bilaterally, no wheezing, diminished breath sounds at bases   Abdomen: Soft, non tender, non distended, bowel sounds present, no guarding  Data Reviewed: Basic Metabolic Panel:  Recent Labs Lab 11/27/13 0010 11/28/13 0418 11/29/13 0437 11/30/13 0516 12/01/13 0420  NA 139 138 140 142 140  K 3.2* 3.3* 3.3* 4.0 4.3  CL 100 100 100 103 100  CO2 23 28 28 30 29   GLUCOSE 110* 101* 137* 108* 101*  BUN 19 18 12 10 11   CREATININE 0.36* 0.37* 0.35* 0.36* 0.38*  CALCIUM 8.3* 8.6 8.7 9.0 9.0   Liver Function Tests: No results found for this basename: AST, ALT, ALKPHOS, BILITOT, PROT, ALBUMIN,  in the last 168 hours CBC:  Recent Labs Lab 11/27/13 11/28/13 0418 11/29/13 0437 11/30/13 0516 12/01/13 0420  WBC 8.2 6.9 6.1 6.2 7.6  HGB 11.4* 11.8* 11.5* 11.1* 11.6*  HCT 34.5* 35.4* 35.4* 34.2* 36.6  MCV 94.0 94.1 94.1 96.6 97.1  PLT 118* 152 149* 127* 144*   CBG:  Recent Labs Lab 12/02/13 1700 12/02/13 2004 12/03/13 0020 12/03/13 0436 12/03/13 0805  GLUCAP 116* 108* 91 90 86    Recent Results (from the past 240 hour(s))  URINE CULTURE     Status: None   Collection Time    11/25/13  6:22 AM      Result Value Ref Range Status   Specimen Description URINE, CATHETERIZED   Final   Special Requests NONE   Final   Culture  Setup Time     Final   Value: 11/25/2013 11:43     Performed at [facility] Count     Final   Value: >=100,000 COLONIES/ML     Performed at [facility]   Culture     Final   Value: KLEBSIELLA PNEUMONIAE     Performed at [facility]   Report Status 11/27/2013 FINAL   Final   Organism ID, Bacteria KLEBSIELLA PNEUMONIAE   Final  CLOSTRIDIUM DIFFICILE BY PCR     Status: Abnormal   Collection Time    11/28/13  8:18 AM      Result Value Ref Range Status   C difficile by pcr POSITIVE (*) NEGATIVE Final   Comment: CRITICAL RESULT CALLED TO, READ BACK BY AND VERIFIED WITH:      CAndi Devon RN 10:55 11/28/13 ([email or web address])     Performed at [facility]     Scheduled Meds: . antiseptic oral rinse  15 mL Mouth Rinse q12n4p  . chlorhexidine  15 mL Mouth Rinse BID  . ciprofloxacin  200 mg Intravenous Q12H  . clonazepam  0.5 mg Per Tube BID  . enoxaparin  40 mg Subcutaneous Q24H  . free water  250 mL Per Tube QID  . hydrALAZINE  10 mg Per Tube 4 times per day  . lacosamide  200 mg Per Tube BID  . levETIRAcetam  1,500 mg Per Tube BID  . metoprolol tartrate  50 mg Per Tube BID  .  metronidazole  500 mg Intravenous Q8H  . mirtazapine  15 mg Per J Tube QHS  . risperiDONE  1 mg Per Tube BID  . sodium chloride  3 mL Intravenous Q12H  . Valproic Acid  750 mg Oral 3 times per day  . vancomycin  125 mg Oral QID   Continuous Infusions: . dextrose 50 mL (12/02/13 1901)  . feeding supplement (OSMOLITE 1.5 CAL) 1,000 mL (12/03/13 0129)   Pamella PertGHERGHE, COSTIN, MD, PhD Pager [phone]  If 7PM-7AM, please contact night-coverage [email or web address] Password [email or web address] 12/03/2013, 9:33 AM  Time spent: 25 minutes   LOS: 8 days

## 2013-12-03 NOTE — Progress Notes (Signed)
Inpatient RN visit- [name] Visit-[name] Merilynn FinlandRobertson RN  [facility] Creutzfeldt-Jakob dz. Pt is DNR code, changed on 7/2 after PMT consult w/pt' son [name]. Pt was a full code at home with [facility].     Pt seen at bedside, just returned from MRI. Pt drowsy, did not interact with Clinical research associatewriter. Did not answer questions. Per chart review and staff report pt's loose stools have decreased. She now has mittens in place to both hands to prevent her from pulling out her NG tube., which per chart review was replaced last night after pt pulled it out. NG secured with tape to right nares, tube feeding running at goal rate of 50cc/hr.  Per chart review PMT MD Dr. Phillips OdorGolding has recommended changing all medications to liquid form so as not to occlude NG as this has been a problem in the past at home. No family present at time of visit. [facility] will continue to follow through discharge.  Patient's home medication list and transfer summary in place on shadow chart.   Please call [facility] @ [phone] with any hospice needs.   Thank you. Hansel StarlingKaren E. [name], RN  [facility]  Hospice Liaison  [phone])

## 2013-12-03 NOTE — Progress Notes (Signed)
Palliative Medicine Team Progress Note  Ms. Joan [name] remains mostly unchanged since admission with the exception of improved diarrhea and clinically improved C. Diff with treatment of oral vancomycin and IV hydration.   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  Ms. Joan [name] had a positive 14-3-3 assay, although of moderately high diagnostic accuracy, is an imperfect test and lacks the diagnostic accuracy either to include a CJD diagnosis as a possibility or to rule out CJD. She has been relatively stable neurologically over the past 6 months. Last MRI was 1/22 with resolution of basal ganglia T2 diffusion abnormalities on her initial MRI in 04/2013.   She was admitted to Hospice at home after a short stay at [facility] about two weeks ago. She was unable to maintain a surgically place J tube (pulled out) and has had an NG tube for parenteral feeding for almost 6 weeks. 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 Neurology consulted and has no recommendations for her inpatient stay.  She was admitted for diarrhea and dehydration and is being treated for active C. Diff infection.   Goals of Care/Active Issues:  1. I spoke with her Son Joan [name] over the phone last week, he did not agree to a scheduled time to meet with me but did show up to see his mom later that same day but I was unable to connect with him in person. He understands her to have a progressive neurodegenerative condition. We discussed DNR. He also requested Feeding tube be addressed, he wants his mother to continue to be artificially fed.  2. 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 This presents a unique problem with regards to her hospice care and prognosis and may also have broader and very serious Infectious Disease ramifications. If this is an early onset atypical dementia she could live in this condition for many years.  Would strongly consider repeat MRI to assess for interval changes- while it is fairly obvious that her condition is irreversible further imaging may support further the current diagnosis or support another differential and provide some additional prognostic support.  Repeat EEG may also be helpful in supporting diagnosis.  If collectively the medical team feels that no further testing will be done and that CJD is an accurate diagnosis, we need to discuss with Joan [name] considering autopsy to confirm the diagnosis as a true positive will have important public health implications.   3. Major issues with her feeding tube- right now she has a [address] [place] large Nasogastric tube in place- this is extremely uncomfortable- I strongly recommend having IR place a Dobhoff Tube or small bore PANDA tube placed and converting all of her per tube meds to liquid form-could advance the Dobhoff to the level of a J which would confer perhaps some protection against it becoming completely pulled out. The patient also complains of a very sore throat from the NG tube. She has needed the tube replaced almost daily here in the hospital-will d/w Hospice-this may become a very difficult home care need unfortunately.  4. Transition all medications to a liquid.Vimpat does come in a oral solution- but not on formulary inpatient  Vimpat: 10 mg/mL (  200 mL, 465 mL)- all other meds have routine liquid formulations.  Will continue to follow this patient to discharge, I have discussed her case in detail with Dr. Drue SecondSnider with ID in terms of goals of care and expectations for disease course and prognosis- I appreciate her thoughtful review of this case.  Time: 35 mintues  8-8:35PM Greater than 50%  of this time was spent counseling and coordinating care related to the above assessment and plan.  [name] MaltaElizabeth Adrick Kestler, DO Palliative Medicine

## 2013-12-03 NOTE — Progress Notes (Signed)
PT Cancellation Note  ___Treatment cancelled today due to medical issues with patient which prohibited therapy  _X_ Treatment cancelled today due to patient receiving procedure or test ....[name].[name].the patient out of room MRI  ___ Treatment cancelled today due to patient's refusal to participate   ___ Treatment cancelled today due to   Felecia ShellingLori Atha Muradyan  PTA [facility]  Acute  Rehab Pager      [phone]

## 2013-12-03 NOTE — Progress Notes (Signed)
Offsite EEG completed at [facility]. Results pending. 

## 2013-12-04 LAB — GLUCOSE, CAPILLARY
Glucose-Capillary: 96 mg/dL (ref 70–99)
Glucose-Capillary: 93 mg/dL (ref 70–99)
Glucose-Capillary: 80 mg/dL (ref 70–99)

## 2013-12-04 MED ORDER — OSMOLITE 1.5 CAL PO LIQD
237.0000 mL | ORAL | Status: DC
Start: 2013-12-04 — End: 2013-12-04
  Administered 2013-12-04: 237 mL
  Filled 2013-12-04: qty 237

## 2013-12-04 MED ORDER — RISPERIDONE 1 MG PO TABS: 1.0000 mg | ORAL_TABLET | Freq: Two times a day (BID) | ORAL | Status: DC

## 2013-12-04 MED ORDER — HYDRALAZINE HCL 10 MG PO TABS: 10.0000 mg | ORAL_TABLET | Freq: Three times a day (TID) | ORAL | Status: DC

## 2013-12-04 MED ORDER — LORAZEPAM 2 MG/ML IJ SOLN: 0.5000 mg | Freq: Four times a day (QID) | INTRAMUSCULAR | Status: DC | PRN

## 2013-12-04 MED ORDER — METRONIDAZOLE 50 MG/ML ORAL SUSPENSION: 500.0000 mg | Freq: Three times a day (TID) | ORAL | Status: DC

## 2013-12-04 MED ORDER — VANCOMYCIN 50 MG/ML ORAL SOLUTION: 125.0000 mg | Freq: Four times a day (QID) | ORAL | Status: DC

## 2013-12-04 MED ORDER — LEVETIRACETAM 100 MG/ML PO SOLN: 1500.0000 mg | Freq: Two times a day (BID) | ORAL | Status: DC

## 2013-12-04 MED ORDER — METOPROLOL TARTRATE 25 MG/10 ML ORAL SUSPENSION: 50.0000 mg | Freq: Two times a day (BID) | ORAL | Status: DC

## 2013-12-04 MED ORDER — ACETAMINOPHEN 160 MG/5ML PO SOLN: 650.0000 mg | Freq: Four times a day (QID) | ORAL | Status: DC | PRN

## 2013-12-04 MED ORDER — CLONAZEPAM 0.5 MG PO TBDP: 0.5000 mg | ORAL_TABLET | Freq: Two times a day (BID) | ORAL | Status: DC

## 2013-12-04 NOTE — Progress Notes (Signed)
PT Cancellation Note  ___Treatment cancelled today due to medical issues with patient which prohibited therapy  ___ Treatment cancelled today due to patient receiving procedure or test   ___ Treatment cancelled today due to patient's refusal to participate   _X_ Treatment cancelled today due to........ Plans to D/C via ambulance to home today with Hospice  Felecia ShellingLori Waleska Buttery  [facility]  Acute  Rehab Pager      [phone]

## 2013-12-04 NOTE — Progress Notes (Signed)
Inpatient RN [facility]- [name] RN Visit-Joan Merilynn FinlandRobertson RN  [facility]  of Creutzfeldt-Jakob dz. Pt is now a DNR code (effective 7/2 per Dr.Golding's note),signed OOF DNR form in place on shadow chart for planned d/c today via non emergent transport. Prescriptions reviewed with staff RN Joan [name], Rx for lorazepam INJECTION needs to be corrected to via tube or left off entirely as pt takes clonazepam 0.5mg  Q 8 via tube at home. Current rx on chart  for Clonazepam 0.5mg  reads twice a day.Corrections to be made per staff RN Joan [name]. Per conversation with staff RN Joan [name] and CM Joan [name] patient's son Joan SpareMarcus is aware of discharge and will be at home to receive her.  Pt seen at bedside, asleep, mittens in place to bilateral hands to prevent NG tube dislodgement. Tube secured w/ tape  to R nares, TF running at goal rate of 50cc/hr. Per previous conversation with dietician Joan [name], patient will transition back to her home feeding formula of Jevity 1.5 after discharge. [facility] home care team alerted to discharge today.  Patient's home medication list and transfer summary is on shadow chart.   Please call [facility] @ [phone]-  with any hospice needs.   Thank you. Joan StarlingKaren E. [name], RN  [facility]  Hospice Liaison  [phone])

## 2013-12-04 NOTE — Discharge Instructions (Signed)
Clostridium Difficile Infection °Clostridium difficile (C. difficile) is a bacteria found in the intestinal tract or colon. Under certain conditions, it causes diarrhea and sometimes severe disease. The severe form of the disease is known as pseudomembranous colitis (often called C. difficile colitis). This disease can damage the lining of the colon or cause the colon to become enlarged (toxic megacolon).  °CAUSES  °Your colon normally contains many different bacteria, including C. difficile. The balance of bacteria in your colon can change during illness. This is especially true when you take antibiotic medicine. Taking antibiotics may allow the C. difficile to grow, multiply excessively, and make a toxin that then causes illness. The elderly and people with certain medical conditions have a greater risk of getting C. difficile infections. °SYMPTOMS  °· Watery diarrhea. °· Fever. °· Fatigue. °· Loss of appetite. °· Nausea. °· Abdominal swelling, pain, or tenderness. °· Dehydration. °DIAGNOSIS  °Your symptoms may make your caregiver suspicious of a C. difficile infection, especially if you have used antibiotics in the preceding weeks. However, there are only 2 ways to know for certain whether you have a C. difficile infection: °· A lab test that finds the toxin in your stool. °· The specific appearance of an abnormality (pseudomembrane) in your colon. This can only be seen by doing a sigmoidoscopy or colonoscopy. These procedures involve passing an instrument through your rectum to look at the inside of your colon. °Your caregiver will help determine if these tests are necessary. °TREATMENT  °· Most people are successfully treated with one of two specific antibiotics, usually given by mouth. Other antibiotics you are receiving are stopped if possible. °· Intravenous (IV) fluids and correction of electrolyte imbalance may be necessary. °· Rarely, surgery may be needed to remove the infected part of the  intestines. °· Careful hand washing by you and your caregivers is important to prevent the spread of infection. In the hospital, your caregivers may also put on gowns and gloves to prevent the spread of the C. difficile bacteria. Your room is also cleaned regularly with a solution containing bleach or a product that is known to kill C. difficile. °HOME CARE INSTRUCTIONS °· Drink enough fluids to keep your urine clear or pale yellow. Avoid milk, caffeine, and alcohol. °· Ask your caregiver for specific rehydration instructions. °· Try eating small, frequent meals rather than large meals. °· Take your antibiotics as directed. Finish them even if you start to feel better. °· Do not use medicines to slow diarrhea. This could delay healing or cause complications. °· Wash your hands thoroughly after using the bathroom and before preparing food. °· Make sure people who live with you wash their hands often, too. °· Carefully disinfect all surfaces with a product that contains chlorine bleach. °SEEK MEDICAL CARE IF: °· Diarrhea persists longer than expected or recurs after completing your course of antibiotic treatment for the C. difficile infection. °· You have trouble staying hydrated. °SEEK IMMEDIATE MEDICAL CARE IF: °· You develop a new fever. °· You have increasing abdominal pain or tenderness. °· There is blood in your stools, or your stools are dark black and tarry. °· You cannot hold down food or liquids. °MAKE SURE YOU:  °· Understand these instructions. °· Will watch your condition. °· Will get help right away if you are not doing well or get worse. °Document Released: 02/23/2005 Document Revised: 09/10/2012 Document Reviewed: 10/22/2010 °Exit[employer]® Patient Information ©2015 [employer]. This information is not intended to replace advice given to you by   your health care provider. Make sure you discuss any questions you have with your health care provider. ° °

## 2013-12-04 NOTE — Procedures (Signed)
History: 53 yo F with altered mental status.   Sedation: None  Technique: This is a 17 channel routine scalp EEG performed at the bedside with bipolar and monopolar montages arranged in accordance to the international 10/20 system of electrode placement. One channel was dedicated to EKG recording.    Background: the background consists of intermixed irregular delta and theta activities. This decreased with sleep and increased with stimulation.   Photic stimulation: Physiologic driving is Not performed  EEG Abnormalities: 1) Irregular generalized slow activity.   Clinical Interpretation: This EEG is consistent with a generalized non-specific cerebral dysfunction(encephalopathy). There was no seizure or seizure predisposition recorded on this study.   Ritta SlotMcNeill Janari Yamada, MD [facility] [phone]  If 7pm- 7am, please page neurology on call as listed in AMION.

## 2013-12-04 NOTE — Discharge Summary (Signed)
Physician Discharge Summary  Joan [name] ZOX:[id number] DOB: 10/08/1960 DOA: 11/25/2013  PCP: [name], MD  Admit date: 11/25/2013 Discharge date: 12/04/2013  Recommendations for Outpatient Follow-up:  1. Pt will need to follow up with PCP in 2-3 weeks post discharge 2. Please obtain BMP to evaluate electrolytes and kidney function 3. Please also check CBC to evaluate Hg and Hct levels 4. Pt discharged on vancomycin and flagyl for C. Diff and to continue taking until July 15ht, 2015   Discharge Diagnoses:  Principal Problem:   Hypernatremia Active Problems:   Dehydration   Essential hypertension, benign   Seizure   Complication of feeding tube  Discharge Condition: Stable  Diet recommendation: Heart healthy diet discussed in details   Brief narrative:  53 y.o. female with seizure disorder, history of multiple displaced feeding tubes and NG tube (reportedly per notes patient not candidate for feeding tube placement), history of strokes, hypertension, presented to [facility] ED after NG tube got dislodged. Please note that most of the history was obtained from EMR and and ER physician as patient was unable to provide history given her limited responses based on her past medical history listed. Son reportedly, is caregiver but not available via phone or at bedside for further questioning regarding patient's history. While in the ED patient was found to have elevated sodium levels and lab work was consistent with dehydration. [facility] asked to admit for further evaluation and management.   A/P  C diff colitis - started po Vancomycin.  - diarrhea improving, 2 time per day yesterday, pt wants to go home  - Flagyl added and pt to continue taking both upon discharge  Progressive neurological disease - this is not clear as to the etiology, classification and prognosis. 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 Given unknown prognosis and life expectancy, this further complicates her clinical picture. She had a surgical J placed in February but now has an NG for the past 2.5 months.  - surgery evaluated patient on 7/2 for PEG/surgical J, she is not a surgical candidate  - called IR regarding ways to secure the NG tube to prevent accidental removal, there are no good ways.  - while may use a post pyloric tube, this will not prevent dislodging  Hypernatremia- due to dehydration, IVF provided and now resolved  Klebsiella UTI - cultures with Klebsiella sensitive to Cipro, now s/p 7 days of treatment, Cipro discontinued on 7/7  Dehydration - IVF provided and clinically more euvolemic this AM. Resume feeds.  Essential hypertension, benign - reasonable inpatient control  Leukocytosis - from UTI and dehydration Seizure - continue AED, EEG with no specific acute findings    Consultants:  Palliative care  Neurology  Surgery  IR Procedures/Studies:  Dg Chest 1 View 11/25/2013 No definite acute cardiopulmonary process of the lung apices obscured by facial structures.  Dg Abd 1 View 11/25/2013 Nasogastric tube tip projects in proximal stomach. Nonspecific bowel gas pattern, partially imaged.  Dg Abd 1 View 11/25/2013 The nasogastric tube is no longer visible in the abdomen.  Antibiotics:  Cipro IV 6/30 --> stopped upon discharge  Po Vancomycin 7/2 >> Flagyl >> continue taking upon discharge along with vancomycin  Code Status: Full  Family Communication: son over the phone   Discharge Exam: Filed Vitals:   12/04/13 0510  BP: 134/66  Pulse: 105  Temp: 98.8 F (37.1 C)  Resp: 20   Filed Vitals:   12/03/13 1440 12/03/13 2110 12/04/13 0037 12/04/13 0510  BP: 126/50 121/72 95/40 134/66  Pulse: 90 92  105   Temp: 98.3 F (36.8 C) 98.9 F (37.2 C)  98.8 F (37.1 C)  TempSrc: Oral Oral  Oral  Resp: 22 20  20   Height:      Weight:    74.7 kg (164 lb 10.9 oz)  SpO2: 100% 100%  100%    General: Pt is alert, minimally verbal, says she wants to go home  Cardiovascular: Regular rate and rhythm, S1/S2 +, no murmurs, no rubs, no gallops Respiratory: Clear to auscultation bilaterally, no wheezing, no crackles, no rhonchi  Discharge Instructions  Discharge Instructions   Diet - low sodium heart healthy    Complete by:  As directed      Increase activity slowly    Complete by:  As directed             Medication List    STOP taking these medications       clonazePAM 0.5 MG tablet  Commonly known as:  KLONOPIN  Replaced by:  clonazePAM 0.5 MG disintegrating tablet     levETIRAcetam 1000 MG tablet  Commonly known as:  KEPPRA  Replaced by:  levETIRAcetam 100 MG/ML solution     metoprolol 50 MG tablet  Commonly known as:  LOPRESSOR  Replaced by:  metoprolol tartrate 25 mg/10 mL Susp      TAKE these medications       acetaminophen 160 MG/5ML solution  Commonly known as:  TYLENOL  Place 20.3 mLs (650 mg total) into feeding tube every 6 (six) hours as needed for mild pain or fever.     clonazePAM 0.5 MG disintegrating tablet  Commonly known as:  KLONOPIN  Place 1 tablet (0.5 mg total) into feeding tube 2 (two) times daily.     free water Soln  Place 200 mLs into feeding tube 4 (four) times daily.     hydrALAZINE 10 MG tablet  Commonly known as:  APRESOLINE  Place 1 tablet (10 mg total) into feeding tube 3 (three) times daily.     lacosamide 200 MG Tabs tablet  Commonly known as:  VIMPAT  Place 1 tablet (200 mg total) into feeding tube 2 (two) times daily.     levETIRAcetam 100 MG/ML solution  Commonly known as:  KEPPRA  Place 15 mLs (1,500 mg total) into feeding tube 2 (two) times daily.     LORazepam 2 MG/ML injection  Commonly known as:  ATIVAN  Inject 0.25-0.5 mLs  (0.5-1 mg total) into the vein every 6 (six) hours as needed for anxiety.     metoprolol tartrate 25 mg/10 mL Susp  Commonly known as:  LOPRESSOR  Place 20 mLs (50 mg total) into feeding tube 2 (two) times daily.     metroNIDAZOLE 50 mg/ml oral suspension  Commonly known as:  FLAGYL  Take 10 mLs (500 mg total) by mouth 3 (three) times daily. Continue taking until July 15th, 2015     mirtazapine 15 MG disintegrating tablet  Commonly known as:  REMERON SOL-TAB  1 tablet (15 mg total) by Per J Tube route at bedtime.     pantoprazole 40 MG injection  Commonly known as:  PROTONIX  Inject 40 mg into  the vein every 12 (twelve) hours.     potassium chloride 20 MEQ packet  Commonly known as:  KLOR-CON  Take 20 mEq by mouth daily.     risperiDONE 1 MG tablet  Commonly known as:  RISPERDAL  Place 1 tablet (1 mg total) into feeding tube 2 (two) times daily. Via feeding tube     Valproic Acid 250 MG/5ML Syrp syrup  Commonly known as:  DEPAKENE  Take 15 mLs (750 mg total) by mouth every 8 (eight) hours.     vancomycin 50 mg/mL oral solution  Commonly known as:  VANCOCIN  Take 2.5 mLs (125 mg total) by mouth 4 (four) times daily. Continue taking until July 15th, 2015           Follow-up Information   Schedule an appointment as soon as possible for a visit with [name], [facility].   Specialty:  Internal Medicine   Contact information:   [address] [place] [address] [phone]        The results of significant diagnostics from this hospitalization (including imaging, microbiology, ancillary and laboratory) are listed below for reference.     Microbiology: Recent Results (from the past 240 hour(s))  URINE CULTURE     Status: None   Collection Time    11/25/13  6:22 AM      Result Value Ref Range Status   Specimen Description URINE, CATHETERIZED   Final   Special Requests NONE   Final   Culture  Setup Time     Final   Value: 11/25/2013 11:43     Performed at  [facility]   Colony Count     Final   Value: >=100,000 COLONIES/ML     Performed at [facility]   Culture     Final   Value: KLEBSIELLA PNEUMONIAE     Performed at [facility]   Report Status 11/27/2013 FINAL   Final   Organism ID, Bacteria KLEBSIELLA PNEUMONIAE   Final  CLOSTRIDIUM DIFFICILE BY PCR     Status: Abnormal   Collection Time    11/28/13  8:18 AM      Result Value Ref Range Status   C difficile by pcr POSITIVE (*) NEGATIVE Final   Comment: CRITICAL RESULT CALLED TO, READ BACK BY AND VERIFIED WITH:     Roslyn Smiling. HUGHEY RN 10:55 11/28/13 ([email or web address])     Performed at [facility]     Labs: Basic Metabolic Panel:  Recent Labs Lab 11/28/13 0418 11/29/13 0437 11/30/13 0516 12/01/13 0420 12/03/13 1259  NA 138 140 142 140 137  K 3.3* 3.3* 4.0 4.3 4.1  CL 100 100 103 100 100  CO2 28 28 30 29 25   GLUCOSE 101* 137* 108* 101* 79  BUN 18 12 10 11 11   CREATININE 0.37* 0.35* 0.36* 0.38* 0.34*  CALCIUM 8.6 8.7 9.0 9.0 8.8   CBC:  Recent Labs Lab 11/28/13 0418 11/29/13 0437 11/30/13 0516 12/01/13 0420  WBC 6.9 6.1 6.2 7.6  HGB 11.8* 11.5* 11.1* 11.6*  HCT 35.4* 35.4* 34.2* 36.6  MCV 94.1 94.1 96.6 97.1  PLT 152 149* 127* 144*   CBG:  Recent Labs Lab 12/03/13 1652 12/03/13 2006 12/04/13 0034 12/04/13 0502 12/04/13 0806  GLUCAP 96 99 96 93 80     SIGNED: Time coordinating discharge: Over 30 minutes  MAGICK-Maude Hettich, MD  [facility] 12/04/2013, 10:52 AM Pager [phone]  If 7PM-7AM, please contact night-coverage [email or web address] Password [email or web address]

## 2013-12-19 ENCOUNTER — Emergency Department (HOSPITAL_COMMUNITY): Payer: Medicare Other

## 2013-12-19 ENCOUNTER — Emergency Department (HOSPITAL_COMMUNITY)
Admission: EM | Admit: 2013-12-19 | Discharge: 2013-12-19 | Disposition: A | Discharge status: Home / Self Care | Payer: Medicare Other | Attending: Emergency Medicine | Admitting: Emergency Medicine

## 2013-12-19 ENCOUNTER — Encounter (HOSPITAL_COMMUNITY): Payer: Self-pay | Admitting: Emergency Medicine

## 2013-12-19 DIAGNOSIS — Z792 Long term (current) use of antibiotics: Secondary | ICD-10-CM | POA: Diagnosis not present

## 2013-12-19 DIAGNOSIS — I10 Essential (primary) hypertension: Secondary | ICD-10-CM | POA: Insufficient documentation

## 2013-12-19 DIAGNOSIS — Z79899 Other long term (current) drug therapy: Secondary | ICD-10-CM | POA: Diagnosis not present

## 2013-12-19 DIAGNOSIS — Z4659 Encounter for fitting and adjustment of other gastrointestinal appliance and device: Secondary | ICD-10-CM

## 2013-12-19 DIAGNOSIS — Z880 Allergy status to penicillin: Secondary | ICD-10-CM | POA: Diagnosis not present

## 2013-12-19 DIAGNOSIS — G40909 Epilepsy, unspecified, not intractable, without status epilepticus: Secondary | ICD-10-CM | POA: Diagnosis not present

## 2013-12-19 DIAGNOSIS — F411 Generalized anxiety disorder: Secondary | ICD-10-CM | POA: Diagnosis not present

## 2013-12-19 MED ORDER — LIDOCAINE HCL 2 % EX GEL
CUTANEOUS | Status: AC
  Filled 2013-12-19: qty 10

## 2013-12-19 NOTE — ED Notes (Signed)
Per EMS- Patient has a NG tube that is clogged. A home health nurse attempted to unclog, but was unsuccessful. Patient is a quadraplegic.

## 2013-12-19 NOTE — ED Notes (Signed)
Patient lives with son Son called at number listed in demographics and made aware of ED plan of care and that patient is now up for DC back home Son v/u and agrees to DC--will be at the house when patient arrives [facility] called and made aware of need to transport patient back home

## 2013-12-19 NOTE — Discharge Instructions (Signed)
Followup with your doctor as needed

## 2013-12-19 NOTE — ED Notes (Signed)
NG tube advanced per radiology recommendation [facility]

## 2013-12-19 NOTE — ED Notes (Signed)
14 fr ng tube inserted, radiology called to verify placement with x-ray.

## 2013-12-19 NOTE — ED Notes (Signed)
[facility] staff present on unit to take patient home

## 2013-12-19 NOTE — ED Provider Notes (Addendum)
CSN: [id number]     Arrival date & time 12/19/13  1747 History   First MD Initiated Contact with Patient 12/19/13 1749     Chief Complaint  Patient presents with  . ng tube clogged      (Consider location/radiation/quality/duration/timing/severity/associated sxs/prior Treatment) HPI Comments: Patient here after they were able to use her NG tube. He tried to unclog without success. Symptoms persisted and nothing makes them better or worse. No reported emesis noted.no Reported fever.  The history is provided by the patient.    Past Medical History  Diagnosis Date  . Peripheral neuropathy   . Fibromyalgia   . Hypertension   . Anxiety   . Catatonia   . Seizures    Past Surgical History  Procedure Laterality Date  . Gastric bypass    . Abdominal hysterectomy    . Jejunostomy N/A 06/25/2013    Procedure:  OPEN JEJUNOSTOMY FEEDING TUBE ;  Surgeon: Cherylynn RidgesJames O [name], MD;  [facility];  Service: General;  Laterality: N/A;  . Laparotomy N/A 07/02/2013    Procedure: EXPLORATORY LAPAROTOMY with revision of jejunostomy feeding tube.;  Surgeon: Cherylynn RidgesJames O [name], MD;  [facility];  Service: General;  Laterality: N/A;  . Bowel resection N/A 07/02/2013    Procedure: SMALL BOWEL RESECTION;  Surgeon: Cherylynn RidgesJames O [name], MD;  [facility];  Service: General;  Laterality: N/A;   Family History  Problem Relation Age of Onset  . Hypertension Mother   . Diabetes Father    History  Substance Use Topics  . Smoking status: Never Smoker   . Smokeless tobacco: Never Used  . Alcohol Use: No   OB History   Grav Para Term Preterm Abortions TAB SAB Ect Mult Living                 Review of Systems  All other systems reviewed and are negative.     Allergies  Penicillins; Prednisone; Citalopram; Clindamycin; Duloxetine hcl; Escitalopram; Hydromorphone; Morphine; Paroxetine; Sertraline; Sertraline hcl; Sulfamethoxazole-trimethoprim; Tape; Clarithromycin; Doxycycline; Metoclopramide; Morphine and  related; and Paroxetine hcl  Home Medications   Prior to Admission medications   Medication Sig Start Date End Date Taking? Authorizing Provider  acetaminophen (TYLENOL) 160 MG/5ML solution Place 20.3 mLs (650 mg total) into feeding tube every 6 (six) hours as needed for mild pain or fever. 12/04/13   Dorothea OgleIskra M Myers, MD  clonazePAM (KLONOPIN) 0.5 MG disintegrating tablet Place 1 tablet (0.5 mg total) into feeding tube 2 (two) times daily. 12/04/13   Dorothea OgleIskra M Myers, MD  hydrALAZINE (APRESOLINE) 10 MG tablet Place 1 tablet (10 mg total) into feeding tube 3 (three) times daily. 12/04/13   Dorothea OgleIskra M Myers, MD  lacosamide (VIMPAT) 200 MG TABS tablet Place 1 tablet (200 mg total) into feeding tube 2 (two) times daily. 08/27/13   Alison MurrayAlma M Devine, MD  levETIRAcetam (KEPPRA) 100 MG/ML solution Place 15 mLs (1,500 mg total) into feeding tube 2 (two) times daily. 12/04/13   Dorothea OgleIskra M Myers, MD  metoprolol tartrate (LOPRESSOR) 25 mg/10 mL SUSP Place 20 mLs (50 mg total) into feeding tube 2 (two) times daily. 12/04/13   Dorothea OgleIskra M Myers, MD  metroNIDAZOLE (FLAGYL) 50 mg/ml oral suspension Take 10 mLs (500 mg total) by mouth 3 (three) times daily. Continue taking until July 15th, 2015 12/04/13   Dorothea OgleIskra M Myers, MD  mirtazapine (REMERON SOL-TAB) 15 MG disintegrating tablet 1 tablet (15 mg total) by Per J Tube route at bedtime. 07/30/13   Revonda StandardAllison  Audry Pili, NP  pantoprazole (PROTONIX) 40 MG injection Inject 40 mg into the vein every 12 (twelve) hours. 08/27/13   Alison Murray, MD  potassium chloride (KLOR-CON) 20 MEQ packet Take 20 mEq by mouth daily.    Historical Provider, MD  risperiDONE (RISPERDAL) 1 MG tablet Place 1 tablet (1 mg total) into feeding tube 2 (two) times daily. Via feeding tube 12/04/13   Dorothea Ogle, MD  Valproic Acid (DEPAKENE) 250 MG/5ML SYRP syrup Take 15 mLs (750 mg total) by mouth every 8 (eight) hours. 08/27/13   Alison Murray, MD  vancomycin (VANCOCIN) 50 mg/mL oral solution Take 2.5 mLs (125 mg total) by mouth 4  (four) times daily. Continue taking until July 15th, 2015 12/04/13   Dorothea Ogle, MD  Water For Irrigation, Sterile (FREE WATER) SOLN Place 200 mLs into feeding tube 4 (four) times daily. 08/27/13   Alison Murray, MD   BP 132/85  Pulse 107  Temp(Src) 98.2 F (36.8 C) (Oral)  Resp 18  SpO2 98% Physical Exam  Nursing note and vitals reviewed. Constitutional: She is oriented to person, place, and time. She appears well-developed and well-nourished.  Non-toxic appearance. No distress.  HENT:  Head: Normocephalic and atraumatic.  Eyes: Conjunctivae, EOM and lids are normal. Pupils are equal, round, and reactive to light.  Neck: Normal range of motion. Neck supple. No tracheal deviation present. No mass present.  Cardiovascular: Normal rate, regular rhythm and normal heart sounds.  Exam reveals no gallop.   No murmur heard. Pulmonary/Chest: Effort normal and breath sounds normal. No stridor. No respiratory distress. She has no decreased breath sounds. She has no wheezes. She has no rhonchi. She has no rales.  Abdominal: Soft. Normal appearance and bowel sounds are normal. She exhibits no distension. There is no tenderness. There is no rigidity, no rebound, no guarding and no CVA tenderness.  Musculoskeletal: Normal range of motion. She exhibits no edema and no tenderness.  Neurological: She is alert and oriented to person, place, and time. GCS eye subscore is 4. GCS verbal subscore is 5. GCS motor subscore is 6.  Neurological exam is at baseline  Skin: Skin is warm and dry. No abrasion and no rash noted.  Psychiatric: Her affect is blunt.    ED Course  Procedures (including critical care time) Labs Review Labs Reviewed - No data to display  Imaging Review No results found.   EKG Interpretation None      MDM   Final diagnoses:  None    Patient's NG tube was removed and and noted to be called with what appeared to be noodles. The tube was removed by nursing and will be replaced  by nursing and I will check an x-ray to assure good function  7:32 PM Chest x-ray showed that the patient's NG tube needs to be advanced 4-5 cm. This was done by nursing. Patient stable for discharge  Toy Baker, MD 12/19/13 1829  Toy Baker, MD 12/19/13 [phone]

## 2013-12-24 ENCOUNTER — Encounter (HOSPITAL_COMMUNITY): Payer: Self-pay | Admitting: Emergency Medicine

## 2013-12-24 ENCOUNTER — Emergency Department (HOSPITAL_COMMUNITY)

## 2013-12-24 ENCOUNTER — Emergency Department (HOSPITAL_COMMUNITY)
Admission: EM | Admit: 2013-12-24 | Discharge: 2013-12-25 | Disposition: A | Discharge status: Home / Self Care | Attending: Emergency Medicine | Admitting: Emergency Medicine

## 2013-12-24 DIAGNOSIS — Z4682 Encounter for fitting and adjustment of non-vascular catheter: Secondary | ICD-10-CM | POA: Insufficient documentation

## 2013-12-24 DIAGNOSIS — Z4659 Encounter for fitting and adjustment of other gastrointestinal appliance and device: Secondary | ICD-10-CM

## 2013-12-24 DIAGNOSIS — Z880 Allergy status to penicillin: Secondary | ICD-10-CM | POA: Diagnosis not present

## 2013-12-24 DIAGNOSIS — G40909 Epilepsy, unspecified, not intractable, without status epilepticus: Secondary | ICD-10-CM | POA: Insufficient documentation

## 2013-12-24 DIAGNOSIS — I10 Essential (primary) hypertension: Secondary | ICD-10-CM | POA: Insufficient documentation

## 2013-12-24 DIAGNOSIS — IMO0001 Reserved for inherently not codable concepts without codable children: Secondary | ICD-10-CM | POA: Insufficient documentation

## 2013-12-24 DIAGNOSIS — G608 Other hereditary and idiopathic neuropathies: Secondary | ICD-10-CM | POA: Insufficient documentation

## 2013-12-24 DIAGNOSIS — F411 Generalized anxiety disorder: Secondary | ICD-10-CM | POA: Diagnosis not present

## 2013-12-24 DIAGNOSIS — N390 Urinary tract infection, site not specified: Secondary | ICD-10-CM | POA: Diagnosis not present

## 2013-12-24 DIAGNOSIS — Z79899 Other long term (current) drug therapy: Secondary | ICD-10-CM | POA: Diagnosis not present

## 2013-12-24 LAB — BASIC METABOLIC PANEL
Anion gap: 18 — ABNORMAL HIGH (ref 5–15)
BUN: 21 mg/dL (ref 6–23)
CALCIUM: 9.9 mg/dL (ref 8.4–10.5)
CO2: 26 mEq/L (ref 19–32)
Chloride: 93 mEq/L — ABNORMAL LOW (ref 96–112)
Creatinine, Ser: 0.51 mg/dL (ref 0.50–1.10)
GFR calc Af Amer: >90 mL/min (ref >90)
Glucose, Bld: 82 mg/dL (ref 70–99)
Potassium: 4.5 mEq/L (ref 3.7–5.3)
SODIUM: 137 meq/L (ref 137–147)

## 2013-12-24 LAB — CBC
HCT: 39.7 % (ref 36.0–46.0)
Hemoglobin: 13.1 g/dL (ref 12.0–15.0)
MCH: 31.6 pg (ref 26.0–34.0)
MCHC: 33 g/dL (ref 30.0–36.0)
MCV: 95.9 fL (ref 78.0–100.0)
PLATELETS: 214 10*3/uL (ref 150–400)
RBC: 4.14 MIL/uL (ref 3.87–5.11)
RDW: 14.2 % (ref 11.5–15.5)
WBC: 7.9 10*3/uL (ref 4.0–10.5)

## 2013-12-24 LAB — URINALYSIS, ROUTINE W REFLEX MICROSCOPIC
BILIRUBIN URINE: NEGATIVE
Glucose, UA: NEGATIVE mg/dL
HGB URINE DIPSTICK: NEGATIVE
Ketones, ur: NEGATIVE mg/dL
Nitrite: POSITIVE — AB
Protein, ur: NEGATIVE mg/dL
Specific Gravity, Urine: 1.02 (ref 1.005–1.030)
Urobilinogen, UA: 0.2 mg/dL (ref 0.0–1.0)
pH: 7 (ref 5.0–8.0)

## 2013-12-24 LAB — I-STAT CG4 LACTIC ACID, ED: LACTIC ACID, VENOUS: 0.61 mmol/L (ref 0.5–2.2)

## 2013-12-24 LAB — VALPROIC ACID LEVEL: Valproic Acid Lvl: 58.5 ug/mL (ref 50.0–100.0)

## 2013-12-24 LAB — I-STAT TROPONIN, ED: TROPONIN I, POC: 0 ng/mL (ref 0.00–0.08)

## 2013-12-24 LAB — URINE MICROSCOPIC-ADD ON

## 2013-12-24 MED ORDER — DEXTROSE 5 % IV SOLN
1.0000 g | Freq: Once | INTRAVENOUS | Status: AC
  Administered 2013-12-24: 1 g via INTRAVENOUS
  Filled 2013-12-24: qty 10

## 2013-12-24 MED ORDER — DIPHENHYDRAMINE HCL 50 MG/ML IJ SOLN: 25.0000 mg | Freq: Once | INTRAMUSCULAR | Status: DC

## 2013-12-24 MED ORDER — SODIUM CHLORIDE 0.9 % IV BOLUS (SEPSIS)
1000.0000 mL | Freq: Once | INTRAVENOUS | Status: AC
  Administered 2013-12-24: 1000 mL via INTRAVENOUS

## 2013-12-24 MED ORDER — LORAZEPAM 2 MG/ML IJ SOLN
1.0000 mg | Freq: Once | INTRAMUSCULAR | Status: AC
  Administered 2013-12-24: 1 mg via INTRAVENOUS
  Filled 2013-12-24: qty 1

## 2013-12-24 NOTE — ED Notes (Signed)
EMS reports Home Hospice nurse reported recent NG tube wasn't functioning properly. Home Hospice nurse removed NG this morning and attempted PO Ensure with patient. Patient "swallowing too slow". Patient sent to ED for NG tube replacement. Patient previously had 16g french NG tube.

## 2013-12-24 NOTE — ED Provider Notes (Signed)
CSN: [id number]     Arrival date & time 12/24/13  1707 History   First MD Initiated Contact with Patient 12/24/13 1725     Chief Complaint  Patient presents with  . NG Tube Replacement      (Consider location/radiation/quality/duration/timing/severity/associated sxs/prior Treatment) HPI Comments: Patient is a 53 year old female with a past medical history of peripheral neuropathy, fibromyalgia, hypertension, anxiety, catatonia and seizures who presents to the emergency department via EMS for an NG tube replacement. EMS reports home hospice nurse stated that the recent NG tube was not functioning properly. The NG tube was removed this morning by the hospice nurse. PO ensure was given to patient, however she was "swallowing too slow". She was sent to the emergency department for NG tube replacement. She previously had a 16 F NG tube. Last NG tube was replaced on July 23 in the emergency department. Denies any complaints at this time.  The history is provided by the patient and a relative.    Past Medical History  Diagnosis Date  . Peripheral neuropathy   . Fibromyalgia   . Hypertension   . Anxiety   . Catatonia   . Seizures    Past Surgical History  Procedure Laterality Date  . Gastric bypass    . Abdominal hysterectomy    . Jejunostomy N/A 06/25/2013    Procedure:  OPEN JEJUNOSTOMY FEEDING TUBE ;  Surgeon: Cherylynn Ridges, MD;  Location: [facility] OR;  Service: General;  Laterality: N/A;  . Laparotomy N/A 07/02/2013    Procedure: EXPLORATORY LAPAROTOMY with revision of jejunostomy feeding tube.;  Surgeon: Cherylynn Ridges, MD;  Location: [facility] OR;  Service: General;  Laterality: N/A;  . Bowel resection N/A 07/02/2013    Procedure: SMALL BOWEL RESECTION;  Surgeon: Cherylynn Ridges, MD;  Location: [facility] OR;  Service: General;  Laterality: N/A;   Family History  Problem Relation Age of Onset  . Hypertension Mother   . Diabetes Father    History  Substance Use Topics  . Smoking status: Never Smoker   .  Smokeless tobacco: Never Used  . Alcohol Use: No   OB History   Grav Para Term Preterm Abortions TAB SAB Ect Mult Living                 Review of Systems  All other systems reviewed and are negative.     Allergies  Penicillins; Prednisone; Citalopram; Clindamycin; Duloxetine hcl; Escitalopram; Hydromorphone; Morphine; Paroxetine; Sertraline; Sulfamethoxazole-trimethoprim; Tape; Clarithromycin; Doxycycline; Metoclopramide; Morphine and related; and Paroxetine hcl  Home Medications   Prior to Admission medications   Medication Sig Start Date End Date Taking? Authorizing Provider  clonazePAM (KLONOPIN) 0.5 MG disintegrating tablet Place 0.5 mg into feeding tube 2 (two) times daily.  12/04/13   Dorothea Ogle, MD  hydrALAZINE (APRESOLINE) 10 MG tablet Place 10 mg into feeding tube 3 (three) times daily.  12/04/13   Dorothea Ogle, MD  lacosamide (VIMPAT) 200 MG TABS tablet Place 200 mg into feeding tube 2 (two) times daily.  08/27/13   Alison Murray, MD  levETIRAcetam (KEPPRA) 100 MG/ML solution Place 1,500 mg into feeding tube 2 (two) times daily.  12/04/13   Dorothea Ogle, MD  LORazepam (ATIVAN) 2 MG tablet Place 2 mg into feeding tube every 6 (six) hours as needed for anxiety.     Historical Provider, MD  metoprolol tartrate (LOPRESSOR) 25 mg/10 mL SUSP Place 50 mg into feeding tube 2 (two) times daily.  12/04/13  Dorothea OgleIskra M Myers, MD  metroNIDAZOLE (FLAGYL) 50 mg/ml oral suspension Place 500 mg into feeding tube 3 (three) times daily. Continue taking until July 15th, 2015 12/04/13   Dorothea OgleIskra M Myers, MD  mirtazapine (REMERON SOL-TAB) 15 MG disintegrating tablet Place 15 mg into feeding tube at bedtime.  07/30/13   Russella DarAllison L Ellis, NP  oxyCODONE-acetaminophen (PERCOCET/ROXICET) 5-325 MG per tablet Place 1 tablet into feeding tube every 6 (six) hours as needed for moderate pain or severe pain.     Historical Provider, MD  pantoprazole (PROTONIX) 40 MG injection Inject 40 mg into the vein every 12 (twelve)  hours. 08/27/13   Alison MurrayAlma M Devine, MD  potassium chloride (KLOR-CON) 20 MEQ packet Place 20 mEq into feeding tube daily.     Historical Provider, MD  risperiDONE (RISPERDAL) 1 MG tablet Place 1 tablet (1 mg total) into feeding tube 2 (two) times daily. Via feeding tube 12/04/13   Dorothea OgleIskra M Myers, MD  Valproic Acid (DEPAKENE) 250 MG/5ML SYRP syrup Place 750 mg into feeding tube every 8 (eight) hours.  08/27/13   Alison MurrayAlma M Devine, MD  Water For Irrigation, Sterile (FREE WATER) SOLN Place 200 mLs into feeding tube 4 (four) times daily. 08/27/13   Alison MurrayAlma M Devine, MD  zolpidem (AMBIEN) 5 MG tablet Take 5 mg by mouth at bedtime as needed for sleep.    Historical Provider, MD   BP 104/62  Pulse 99  Temp(Src) 98.2 F (36.8 C) (Oral)  Resp 18  SpO2 99% Physical Exam  Nursing note and vitals reviewed. Constitutional: She is oriented to person, place, and time. She appears well-developed and well-nourished. No distress.  HENT:  Head: Normocephalic and atraumatic.  Mouth/Throat: Oropharynx is clear and moist.  Eyes: Conjunctivae and EOM are normal.  Neck: Normal range of motion. Neck supple.  Cardiovascular: Normal rate, regular rhythm and normal heart sounds.   Pulmonary/Chest: Effort normal and breath sounds normal. No respiratory distress.  Musculoskeletal: Normal range of motion. She exhibits no edema.  Neurological: She is alert and oriented to person, place, and time. No sensory deficit.  Skin: Skin is warm and dry.  Psychiatric: She is slowed.  Flat affect.    ED Course  Procedures (including critical care time) Labs Review Labs Reviewed - No data to display  Imaging Review Dg Chest 2 View  12/24/2013   CLINICAL DATA:  NG tube placement  EXAM: CHEST  2 VIEW  COMPARISON:  Prior radiograph from 12/19/2013  FINDINGS: The cardiac and mediastinal silhouettes are stable in size and contour, and remain within normal limits. Enteric tube is in place with tip and side hole overlying the gastric body, well  beyond the GE junction.  The lungs are normally inflated. Mild bibasilar atelectasis is present. No airspace consolidation, pleural effusion, or pulmonary edema is identified. There is no pneumothorax.  No acute osseous abnormality identified.  IMPRESSION: 1. Enteric tube in place with tip and side hole overlying the gastric body, beyond the GE junction. 2. Mild bibasilar atelectasis. No other acute cardiopulmonary abnormality.   Electronically Signed   By: Rise MuBenjamin  McClintock M.D.   On: 12/24/2013 19:23     EKG Interpretation None      MDM   Final diagnoses:  Encounter for nasogastric (NG) tube placement    Patient presenting for NG tube placement. She is nontoxic appearing and in no apparent distress. At her baseline mentation. Afebrile. Vital signs stable. NG tube placed by nursing staff. X-ray confirming proper placement. Stable for discharge.  8:10 PM At discharge, pt became tachycardic 115-130. Will give IV fluids, obtain labs, EKG. Pt stating she is allergic to popcorn and prior to NG tube placement one of the nurses who was involved was eating popcorn. No airway compromise. No hypoxia. No wheezing. Will give benadryl. 8:27 PM Son was called. Pt not allergic to popcorn. Pt now saying she is not allergic. Denies any pain. D/c benadryl, will give ativan. 9:26 PM Positive UTI. Will start rocephin. 12:13 AM Pt remains afebrile. HR improved with IV fluids to 90. Will d/c with cipro which her urine culture has been sensitive to in the past. Stable for d/c.  Case discussed with attending Dr. Manus Gunning who also evaluated patient and agrees with plan of care.   Trevor Mace, PA-C 12/24/13 1937  Trevor Mace, PA-C 12/25/13 [phone]

## 2013-12-24 NOTE — ED Notes (Signed)
[facility] called for transport.  

## 2013-12-24 NOTE — ED Notes (Addendum)
Upon entering room, pt was noted to be tachycardic around 128-130 bpm. Pt is a poor historian but she kept repeating the word popcorn in her sentences. Pt said that "that girl ate some popcorn around me." Verified if pt was allergic to popcorn and she said yes but it was not listed in her chart. Provider made aware and orders placed for pt. When re-entering the room, pt says that she is not allergic to anything and that she wants to go home. Order changed from benadryl to ativan and pt will continue to be monitored.

## 2013-12-24 NOTE — ED Notes (Signed)
Bed: WA17 Expected date:  Expected time:  Means of arrival:  Comments: EMS-NG tube replacement

## 2013-12-25 MED ORDER — CIPROFLOXACIN HCL 500 MG PO TABS: 500.0000 mg | ORAL_TABLET | Freq: Two times a day (BID) | ORAL | Status: DC

## 2013-12-25 NOTE — Discharge Instructions (Signed)
She will need cipro as directed for the next 7 days, and will need to follow up with her primary care doctor.  Urinary Tract Infection Urinary tract infections (UTIs) can develop anywhere along your urinary tract. Your urinary tract is your body's drainage system for removing wastes and extra water. Your urinary tract includes two kidneys, two ureters, a bladder, and a urethra. Your kidneys are a pair of bean-shaped organs. Each kidney is about the size of your fist. They are located below your ribs, one on each side of your spine. CAUSES Infections are caused by microbes, which are microscopic organisms, including fungi, viruses, and bacteria. These organisms are so small that they can only be seen through a microscope. Bacteria are the microbes that most commonly cause UTIs. SYMPTOMS  Symptoms of UTIs may vary by age and gender of the patient and by the location of the infection. Symptoms in young women typically include a frequent and intense urge to urinate and a painful, burning feeling in the bladder or urethra during urination. Older women and men are more likely to be tired, shaky, and weak and have muscle aches and abdominal pain. A fever may mean the infection is in your kidneys. Other symptoms of a kidney infection include pain in your back or sides below the ribs, nausea, and vomiting. DIAGNOSIS To diagnose a UTI, your caregiver will ask you about your symptoms. Your caregiver also will ask to provide a urine sample. The urine sample will be tested for bacteria and white blood cells. White blood cells are made by your body to help fight infection. TREATMENT  Typically, UTIs can be treated with medication. Because most UTIs are caused by a bacterial infection, they usually can be treated with the use of antibiotics. The choice of antibiotic and length of treatment depend on your symptoms and the type of bacteria causing your infection. HOME CARE INSTRUCTIONS  If you were prescribed  antibiotics, take them exactly as your caregiver instructs you. Finish the medication even if you feel better after you have only taken some of the medication.  Drink enough water and fluids to keep your urine clear or pale yellow.  Avoid caffeine, tea, and carbonated beverages. They tend to irritate your bladder.  Empty your bladder often. Avoid holding urine for long periods of time.  Empty your bladder before and after sexual intercourse.  After a bowel movement, women should cleanse from front to back. Use each tissue only once. SEEK MEDICAL CARE IF:   You have back pain.  You develop a fever.  Your symptoms do not begin to resolve within 3 days. SEEK IMMEDIATE MEDICAL CARE IF:   You have severe back pain or lower abdominal pain.  You develop chills.  You have nausea or vomiting.  You have continued burning or discomfort with urination. MAKE SURE YOU:   Understand these instructions.  Will watch your condition.  Will get help right away if you are not doing well or get worse. Document Released: 02/23/2005 Document Revised: 11/15/2011 Document Reviewed: 06/24/2011 [facility] Patient Information 2015 [employer] [place]. This information is not intended to replace advice given to you by your health care provider. Make sure you discuss any questions you have with your health care provider.

## 2013-12-25 NOTE — ED Provider Notes (Signed)
Medical screening examination/treatment/procedure(s) were conducted as a shared visit with non-physician practitioner(s) and myself.  I personally evaluated the patient during the encounter.  NG displaced, sent for replacement.  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 Transient tachycardia in ED improved with fluids.  Treat UTI.   EKG Interpretation   Date/Time:  Tuesday December 24 2013 20:07:52 EDT Ventricular Rate:  125 PR Interval:    QRS Duration: 76 QT Interval:  347 QTC Calculation: 500 R Axis:   63 Text Interpretation:  Sinus tachycardia Nonspecific repol abnormality,  diffuse leads Borderline prolonged QT interval Artifact in lead(s) I aVR  aVL and baseline wander in lead(s) V3 Artifact No significant change was  found Confirmed by Manus GunningANCOUR  MD, Keion Neels [phone]) on 12/24/2013 8:14:10 PM       Glynn OctaveStephen Cherrish Vitali, MD 12/25/13 [phone]

## 2013-12-25 NOTE — ED Notes (Signed)
[facility] called for transport.  

## 2013-12-27 LAB — URINE CULTURE: Colony Count: >=100000

## 2013-12-28 ENCOUNTER — Telehealth (HOSPITAL_BASED_OUTPATIENT_CLINIC_OR_DEPARTMENT_OTHER): Payer: Self-pay | Admitting: Emergency Medicine

## 2013-12-28 NOTE — Telephone Encounter (Signed)
Post ED Visit - Positive Culture Follow-up  Culture report reviewed by antimicrobial stewardship pharmacist: []  Wes Dulaney, Pharm.D., BCPS []  Celedonio MiyamotoJeremy Frens, Pharm.D., BCPS []  Georgina PillionElizabeth Martin, Pharm.D., BCPS [x]  New MarketMinh Pham, [address] Rainbow BoulevardPharm.D., BCPS, AAHIVP []  Estella HuskMichelle Turner, Pharm.D., BCPS, AAHIVP []    Positive urine culture Treated with Cipro, organism sensitive to the same and no further patient follow-up is required at this time.  Berle MullMiller, Chea Malan 12/28/2013, 9:56 AM

## 2014-01-03 ENCOUNTER — Emergency Department (HOSPITAL_COMMUNITY)

## 2014-01-03 ENCOUNTER — Emergency Department (HOSPITAL_COMMUNITY)
Admission: EM | Admit: 2014-01-03 | Discharge: 2014-01-03 | Disposition: A | Discharge status: Home / Self Care | Attending: Emergency Medicine | Admitting: Emergency Medicine

## 2014-01-03 DIAGNOSIS — Z0189 Encounter for other specified special examinations: Secondary | ICD-10-CM

## 2014-01-03 DIAGNOSIS — Z0389 Encounter for observation for other suspected diseases and conditions ruled out: Secondary | ICD-10-CM | POA: Insufficient documentation

## 2014-01-03 DIAGNOSIS — Z880 Allergy status to penicillin: Secondary | ICD-10-CM | POA: Insufficient documentation

## 2014-01-03 DIAGNOSIS — Z01811 Encounter for preprocedural respiratory examination: Secondary | ICD-10-CM | POA: Insufficient documentation

## 2014-01-03 DIAGNOSIS — Z8669 Personal history of other diseases of the nervous system and sense organs: Secondary | ICD-10-CM | POA: Diagnosis not present

## 2014-01-03 DIAGNOSIS — F411 Generalized anxiety disorder: Secondary | ICD-10-CM | POA: Diagnosis not present

## 2014-01-03 DIAGNOSIS — Z8739 Personal history of other diseases of the musculoskeletal system and connective tissue: Secondary | ICD-10-CM | POA: Diagnosis not present

## 2014-01-03 DIAGNOSIS — G40909 Epilepsy, unspecified, not intractable, without status epilepticus: Secondary | ICD-10-CM | POA: Diagnosis not present

## 2014-01-03 DIAGNOSIS — Z79899 Other long term (current) drug therapy: Secondary | ICD-10-CM | POA: Diagnosis not present

## 2014-01-03 DIAGNOSIS — I10 Essential (primary) hypertension: Secondary | ICD-10-CM | POA: Insufficient documentation

## 2014-01-03 NOTE — ED Provider Notes (Signed)
See prior note   Ward GivensIva L Lenus Trauger, MD 01/03/14 [phone]

## 2014-01-03 NOTE — ED Notes (Signed)
[facility] evaluation of NG tube placement and rhonchi in lungs

## 2014-01-03 NOTE — ED Notes (Signed)
Bed: WU98WA10 Expected date:  Expected time:  Means of arrival:  Comments: EMS 53 yo female/hospice-rhonchi both lungs-unable to verify feeding tube-altered LOC

## 2014-01-03 NOTE — ED Notes (Signed)
[facility] called for transport.  

## 2014-01-03 NOTE — Discharge Instructions (Signed)
PLEASE CONSIDER HAVING NORMAL FEEDING TUBE PUT IN PLACE FOR [facility]. [place].

## 2014-01-03 NOTE — ED Notes (Signed)
[facility] at bedside to transport pt to facility 

## 2014-01-03 NOTE — ED Notes (Signed)
Unable to verify NGT placement with 30 cc air bolus-repositioned NGT-intact left nare-verified with 30 cc air bolus LUQ area and portable chest x-ray obtained-pt is non-verbal with contracted bilateral arms to chest-pt does tear with repositioning of NGT

## 2014-01-03 NOTE — ED Provider Notes (Signed)
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 She was sent to the ED for NG tube placement verification. Pt is nonverbal.   Pt is noted to be coughing/or clearing her throat. Her pulse ox is 100 % on RA. Her lung exam has transmitted upper air way noise.    Medical screening examination/treatment/procedure(s) were conducted as a shared visit with non-physician practitioner(s) and myself.  I personally evaluated the patient during the encounter.   EKG Interpretation None       Devoria AlbeIva Rufus Cypert, MD, Armando GangFACEP   Ward GivensIva L Carnell Beavers, MD 01/03/14 [phone]

## 2014-01-03 NOTE — ED Provider Notes (Signed)
CSN: [id number]     Arrival date & time 01/03/14  4098 History   First MD Initiated Contact with Patient 01/03/14 [phone]     Chief Complaint  Patient presents with  . Possible aspiration     (Consider location/radiation/quality/duration/timing/severity/associated sxs/prior Treatment) HPI Level 5 caveat - catatonia Patient to the ER from [facility] place by EMS from hospice. They are concerned about her feeding tube and that it may be out of place. They are concerned that she may have aspirated from the NG tube not being correctly in place. She has rhonchi in both lungs. 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  Past Medical History  Diagnosis Date  . Peripheral neuropathy   . Fibromyalgia   . Hypertension   . Anxiety   . Catatonia   . Seizures    Past Surgical History  Procedure Laterality Date  . Gastric bypass    . Abdominal hysterectomy    . Jejunostomy N/A 06/25/2013    Procedure:  OPEN JEJUNOSTOMY FEEDING TUBE ;  Surgeon: Cherylynn Ridges, MD;  Location: [facility] OR;  Service: General;  Laterality: N/A;  . Laparotomy N/A 07/02/2013    Procedure: EXPLORATORY LAPAROTOMY with revision of jejunostomy feeding tube.;  Surgeon: Cherylynn Ridges, MD;  Location: [facility] OR;  Service: General;  Laterality: N/A;  . Bowel resection N/A 07/02/2013    Procedure: SMALL BOWEL RESECTION;  Surgeon: Cherylynn Ridges, MD;  Location: [facility] OR;  Service: General;  Laterality: N/A;   Family History  Problem Relation Age of Onset  . Hypertension Mother   . Diabetes Father    History  Substance Use Topics  . Smoking status: Never Smoker   . Smokeless tobacco: Never Used  . Alcohol Use: No   OB History   Grav Para Term Preterm Abortions TAB SAB Ect Mult Living                 Review of Systems  Level 5 caveat - catatonia  Allergies  Penicillins; Prednisone; Citalopram; Clindamycin; Duloxetine hcl; Escitalopram; Hydromorphone; Morphine; Paroxetine; Sertraline;  Sulfamethoxazole-trimethoprim; Tape; Clarithromycin; Doxycycline; Metoclopramide; Morphine and related; and Paroxetine hcl  Home Medications   Prior to Admission medications   Medication Sig Start Date End Date Taking? Authorizing Provider  acetaminophen (TYLENOL) 160 MG/5ML solution Take 480 mg by mouth every 6 (six) hours as needed for mild pain.   Yes Historical Provider, MD  clonazePAM (KLONOPIN) 0.5 MG disintegrating tablet Place 0.5 mg into feeding tube 3 (three) times daily.  12/04/13  Yes Dorothea Ogle, MD  ENSURE (ENSURE) Take 237 mLs by mouth 3 (three) times daily between meals.   Yes Historical Provider, MD  hydrALAZINE (APRESOLINE) 10 MG tablet Place 10 mg into feeding tube 3 (three) times daily.  12/04/13  Yes Dorothea Ogle, MD  lacosamide (VIMPAT) 200 MG TABS tablet Place 200 mg into feeding tube 2 (two) times daily.  08/27/13  Yes Alison Murray, MD  levETIRAcetam (KEPPRA) 100 MG/ML solution Place 1,500 mg into feeding tube 2 (two) times daily.  12/04/13  Yes Dorothea Ogle, MD  LORazepam (ATIVAN) 2 MG tablet Place 2 mg into feeding tube every 6 (six) hours as needed for anxiety.    Yes Historical Provider, MD  metoprolol tartrate (LOPRESSOR) 25 mg/10 mL SUSP Place 50 mg into feeding tube 2 (two) times daily.  12/04/13  Yes Dorothea Ogle, MD  mirtazapine (REMERON SOL-TAB) 15 MG disintegrating tablet Place 15 mg into  feeding tube at bedtime.  07/30/13  Yes Russella DarAllison L Ellis, NP  Nutritional Supplements (FEEDING SUPPLEMENT, JEVITY 1.2 CAL,) LIQD Place 1,000 mLs into feeding tube continuous.   Yes Historical Provider, MD  oxyCODONE-acetaminophen (PERCOCET/ROXICET) 5-325 MG per tablet Place 1 tablet into feeding tube every 6 (six) hours as needed for moderate pain or severe pain.    Yes Historical Provider, MD  pantoprazole (PROTONIX) 40 MG injection Inject 40 mg into the vein every 12 (twelve) hours. 08/27/13  Yes Alison MurrayAlma M Devine, MD  potassium chloride (KLOR-CON) 20 MEQ packet Place 20 mEq into feeding  tube daily.    Yes Historical Provider, MD  risperiDONE (RISPERDAL) 1 MG tablet Place 1 tablet (1 mg total) into feeding tube 2 (two) times daily. Via feeding tube 12/04/13  Yes Dorothea OgleIskra M Myers, MD  Valproic Acid (DEPAKENE) 250 MG/5ML SYRP syrup Place 750 mg into feeding tube every 8 (eight) hours.  08/27/13  Yes Alison MurrayAlma M Devine, MD  zolpidem (AMBIEN) 5 MG tablet Take 5 mg by mouth at bedtime as needed for sleep.   Yes Historical Provider, MD  Water For Irrigation, Sterile (FREE WATER) SOLN Place 200 mLs into feeding tube 4 (four) times daily. 08/27/13   Alison MurrayAlma M Devine, MD   BP 120/64  Pulse 90  Temp(Src) 98.9 F (37.2 C) (Rectal)  Resp 18  SpO2 100% Physical Exam  Nursing note and vitals reviewed. Constitutional: She appears well-developed and well-nourished. No distress.  HENT:  Head: Normocephalic and atraumatic.  Neck: Neck supple.  Cardiovascular: Normal rate and regular rhythm.   Pulmonary/Chest: Effort normal. She has rhonchi.  + coughing  Abdominal: Soft.  Ng tube in place  Neurological:  Pt in a catatonic state, unable to assess neurological exam  Skin: Skin is warm and dry.    ED Course  Procedures (including critical care time) Labs Review Labs Reviewed - No data to display  Imaging Review Dg Chest Portable 1 View  01/03/2014   CLINICAL DATA:  Evaluate nasogastric tube placement.  EXAM: PORTABLE CHEST - 1 VIEW  COMPARISON:  12/24/2013  FINDINGS: Nasogastric tube tip in the distal stomach.  No cardiomegaly for technique. Negative upper mediastinal contours. There is no edema, consolidation, effusion, or pneumothorax.  IMPRESSION: Nasogastric tube tip at the distal stomach.   Electronically Signed   By: Tiburcio PeaJonathan  Watts M.D.   On: 01/03/2014 05:59     EKG Interpretation None      MDM   Final diagnoses:  Encounter for imaging study to confirm nasogastric (NG) tube placement    Chest xray showed NG tube to be in place. Nurse gave pt water through the tube to ensure it was  not clogged. No abnormalities. A rectal temp was taken and she does not have fever. She may have aspirated but at this time she does not have fever nor infiltrates on chest xray.   I recommended to facility on dc paperwork that a normal feeding tube be placed.  Filed Vitals:   01/03/14 0532  BP: 120/64  Pulse: 90  Temp: 98.9 F (37.2 C)  Resp: [address]      Eastyn Dattilo G Audrena Talaga, PA-C 01/03/14 [phone]

## 2014-01-03 NOTE — ED Notes (Addendum)
An air bolus was given through pt's NG tube and placement was verified.  Pt was given a cup of water and water went in through NG tube without difficulty.  Tiffany, PA made aware.

## 2014-01-07 ENCOUNTER — Telehealth: Payer: Self-pay | Admitting: Neurology

## 2014-01-07 NOTE — Telephone Encounter (Signed)
Dr. Toy Co[facility]Fredricks [facility] would [facility] to converse with Dr. Pearlean BrownieSethi regarding 2nd Opinion.  Please return call to [phone].

## 2014-01-08 NOTE — Telephone Encounter (Signed)
Dr. Pearlean BrownieSethi please call Dr. Justin MendFredrick regarding Candida PeelingLora Orbach best contact is [phone]

## 2014-02-07 ENCOUNTER — Telehealth: Payer: Self-pay | Admitting: Neurology

## 2014-02-07 NOTE — Telephone Encounter (Signed)
Patient's son requesting a referral to [facility] for Neurology Consult.  Please return call anytime and may leave detailed message on voicemail.

## 2014-02-10 NOTE — Telephone Encounter (Signed)
Patient returning call to Richeda, please call and advise.

## 2014-02-10 NOTE — Telephone Encounter (Signed)
Talked to the patient's son

## 2014-02-11 ENCOUNTER — Other Ambulatory Visit: Payer: Self-pay | Admitting: *Deleted

## 2014-02-11 DIAGNOSIS — G0490 Encephalitis and encephalomyelitis, unspecified: Secondary | ICD-10-CM

## 2014-02-11 NOTE — Telephone Encounter (Signed)
Dr. Pearlean Brownie has called and talked to the patient i will put in a referral in for [facility].

## 2014-02-12 NOTE — Telephone Encounter (Signed)
I spoke to Dr. 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

## 2014-03-05 NOTE — Telephone Encounter (Signed)
Patient is doing well and will be seen at [facility], November 5th

## 2014-03-05 NOTE — Telephone Encounter (Signed)
I called to check on the patient to see if she was seen by [facility] yet her phone number was not active.

## 2014-06-14 IMAGING — CT CT HEAD W/O CM
2 series · 16 of 30 positions shown, 18 images · non-contrast
Comparison: MR MARIAM SPINE W/O CM dated 06/22/2013

CLINICAL DATA: Mental status change.

EXAM:
CT HEAD WITHOUT CONTRAST
TECHNIQUE: Contiguous axial images were obtained from the base of the skull
through the vertex without intravenous contrast.

[Series 2: head w/o · axial · non-contrast · 0.46mm/px · z∈[+99,+219]mm · 8 of 32 slices shown, 10 images]
[im 4/32  brain]
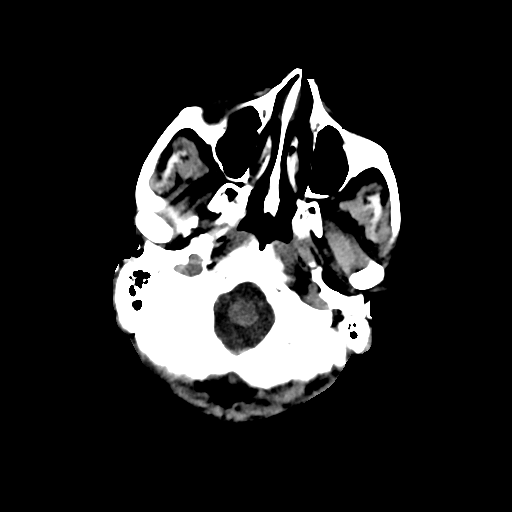
[im 4/32  bone]
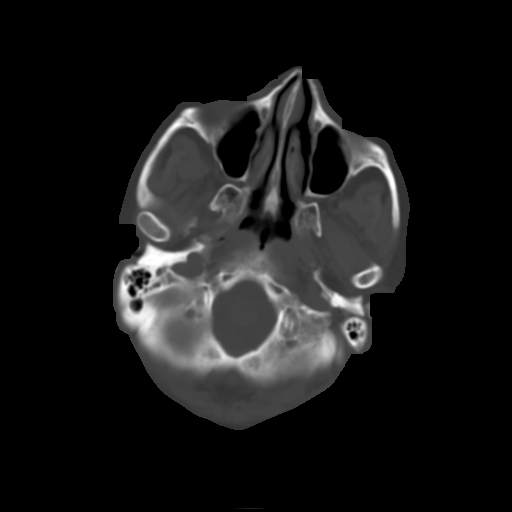
[im 7/32  brain]
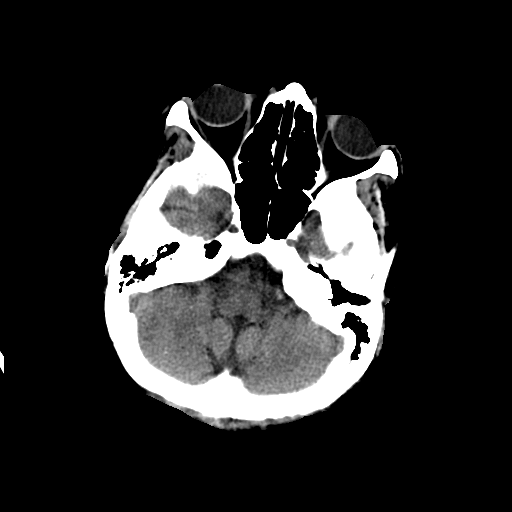
[im 11/32  brain]
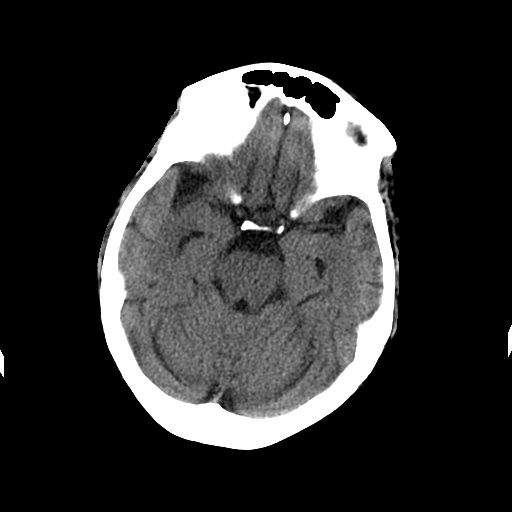
[im 14/32  brain]
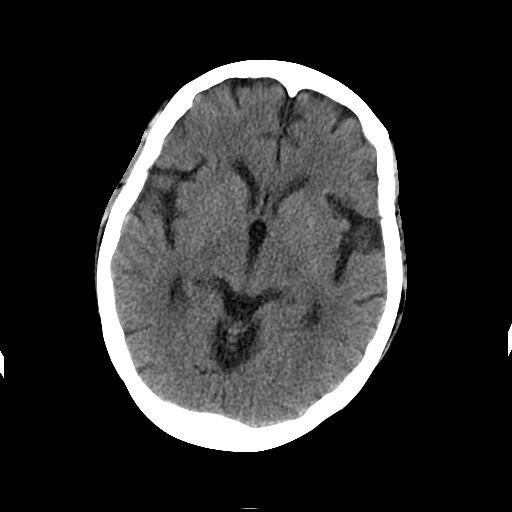
[im 18/32  brain]
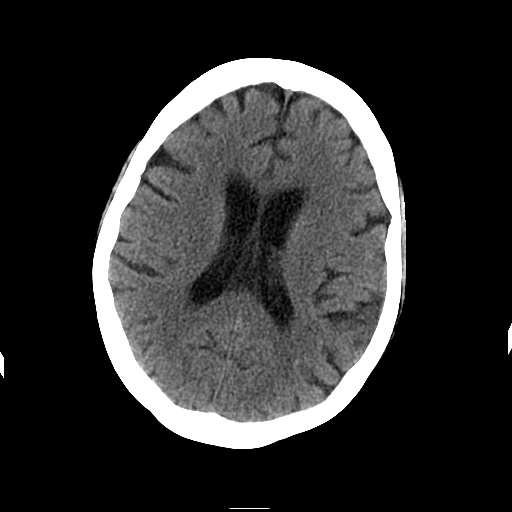
[im 18/32  bone]
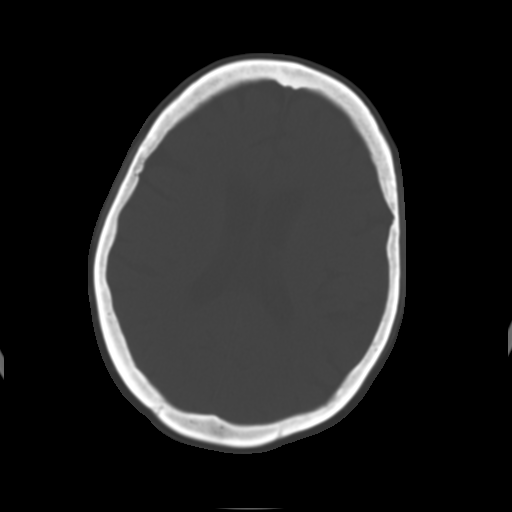
[im 21/32  brain]
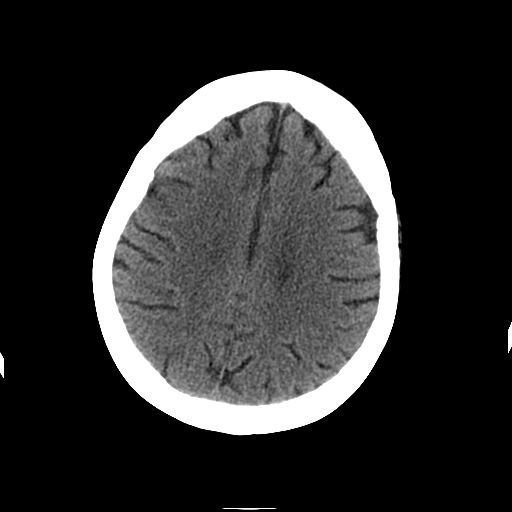
[im 25/32  brain]
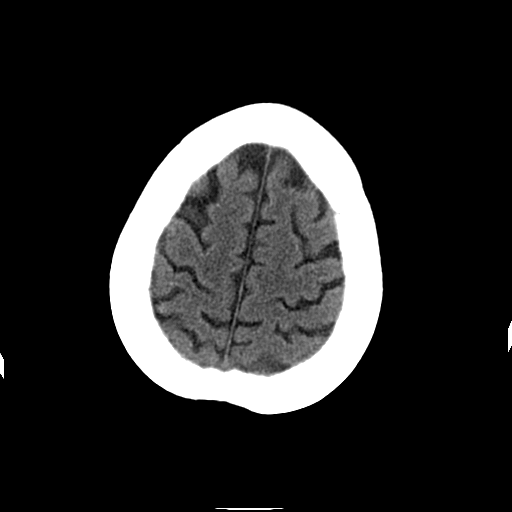
[im 28/32  brain]
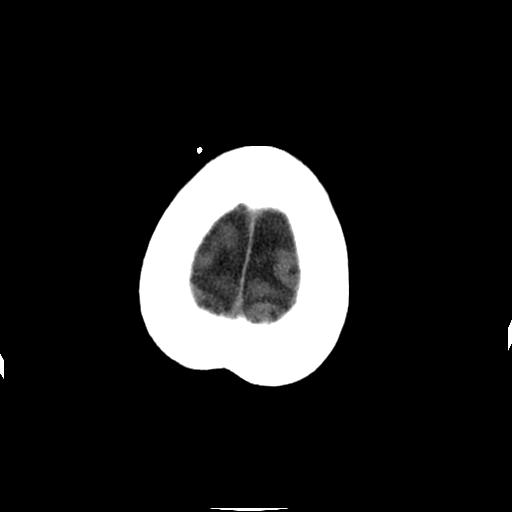

[Series 3: head w/o bone · axial · non-contrast · 0.46mm/px · z∈[+99,+221]mm · 8 of 63 slices shown]
[im 7/63  bone]
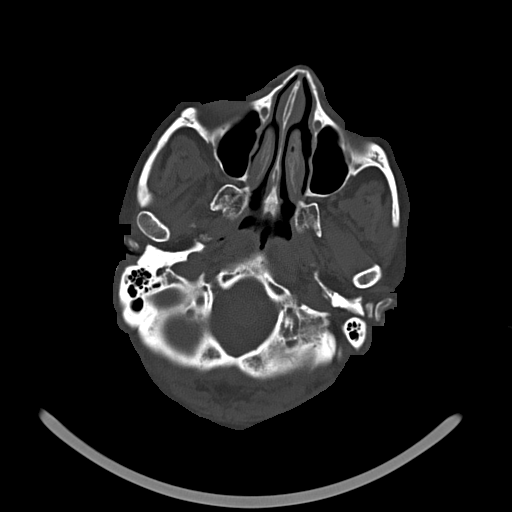
[im 14/63  bone]
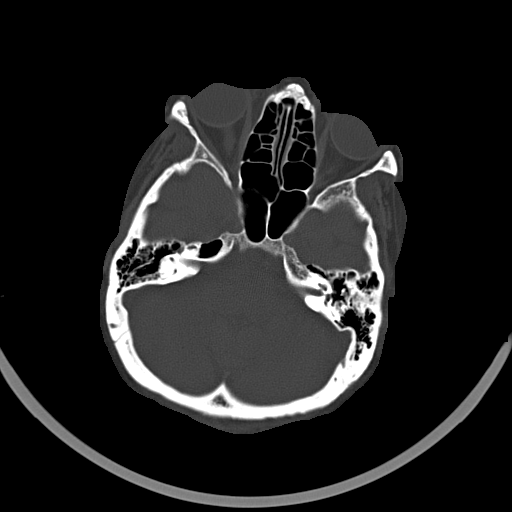
[im 20/63  bone]
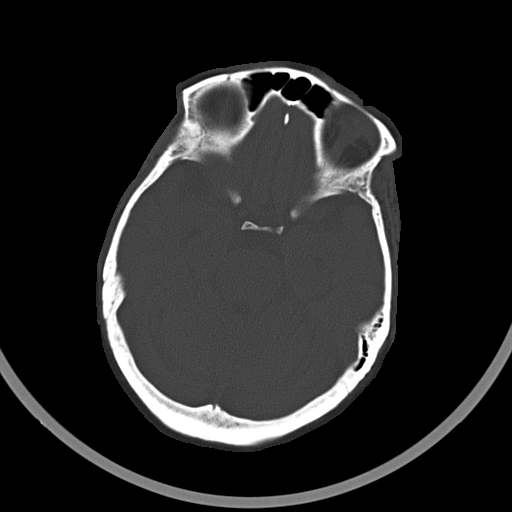
[im 27/63  bone]
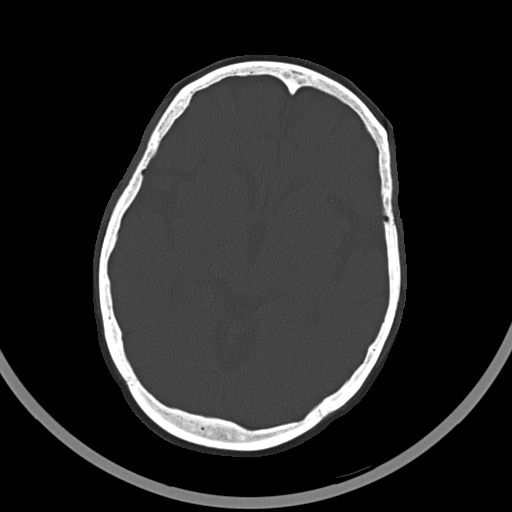
[im 36/63  bone]
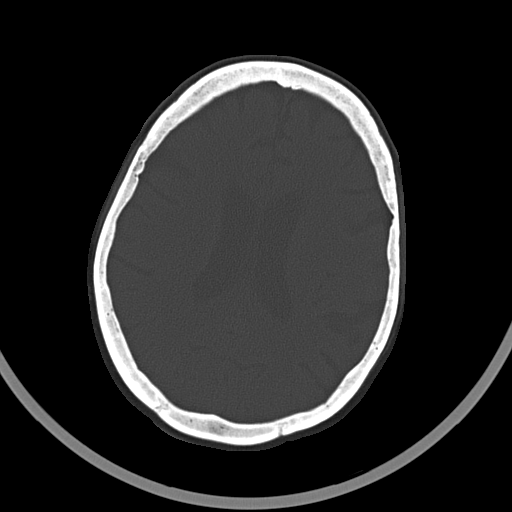
[im 43/63  bone]
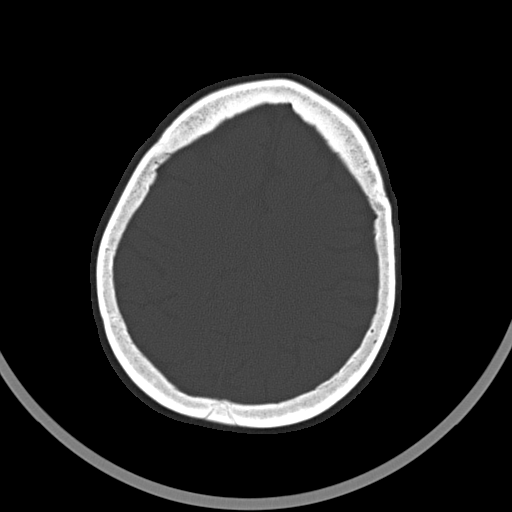
[im 49/63  bone]
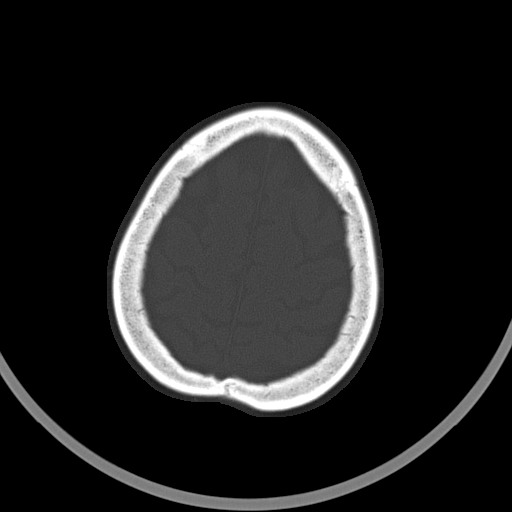
[im 56/63  bone]
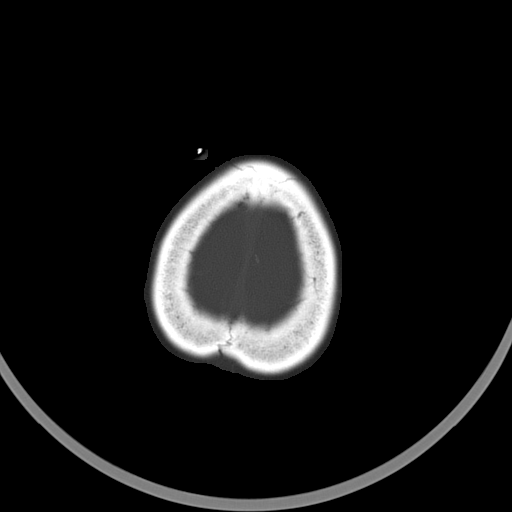

[16 of 30 positions shown; findings below may reference images not displayed]

FINDINGS: No mass. No hydrocephalus. No hemorrhage. Orbits are intact.
Paranasal sinuses are clear where visualized. Mastoids are clear. No
acute bony abnormality.
IMPRESSION: No acute or focal abnormality.

## 2014-06-16 IMAGING — CR DG ABDOMEN 1V
1 series · 1 of 1 positions shown · non-contrast
Comparison: CT ABD/PELVIS W CM dated 07/20/2013; DG ABD 2 VIEWS
dated 07/19/2013

CLINICAL DATA: Nausea and vomiting.

EXAM:
ABDOMEN - 1 VIEW

[t abdomen supine]
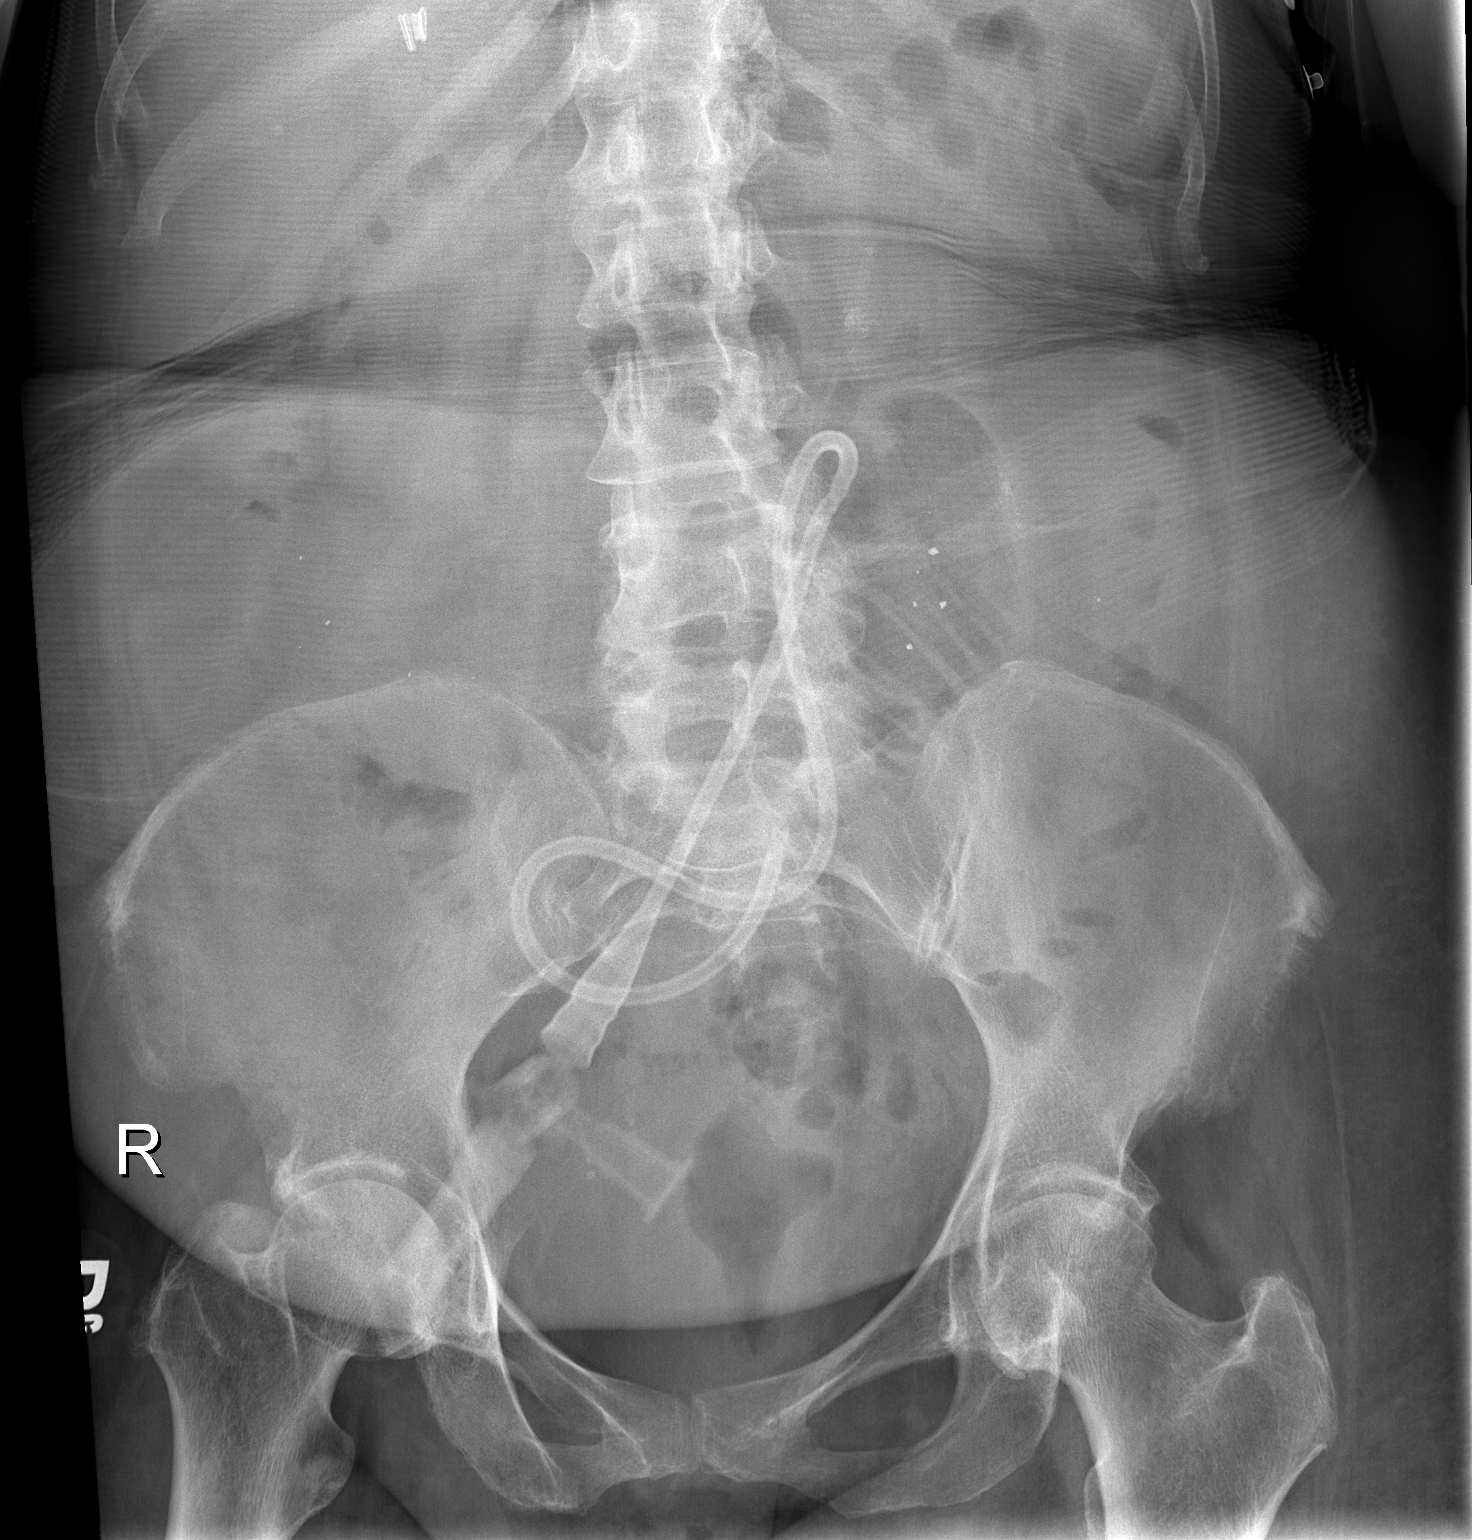

[1 of 1 positions shown; findings below may reference images not displayed]

FINDINGS: The visualized bowel gas pattern is unremarkable. Scattered air and
stool filled loops of colon are seen; no abnormal dilatation of
small bowel loops is seen to suggest small bowel obstruction. No
free intra-abdominal air is identified, though evaluation for free
air is limited on a single supine view. Clips are noted within the
right upper quadrant, reflecting prior cholecystectomy.

The patient's J-tube is seen overlying the lower abdomen, with an
associated mildly distended loop of jejunum.

The visualized osseous structures are within normal limits; the
sacroiliac joints are unremarkable in appearance.
IMPRESSION: Unremarkable bowel gas pattern; no free intra-abdominal air seen.
Mild jejunal distention in association with the patient's J-tube is
is thought to be transient in nature.

## 2014-06-16 IMAGING — CR DG CHEST 1V PORT
1 series · 1 of 1 positions shown · non-contrast
Comparison: Prior radiograph from 07/20/2013

CLINICAL DATA: Fever and seizure

EXAM:
PORTABLE CHEST - 1 VIEW

[AP]
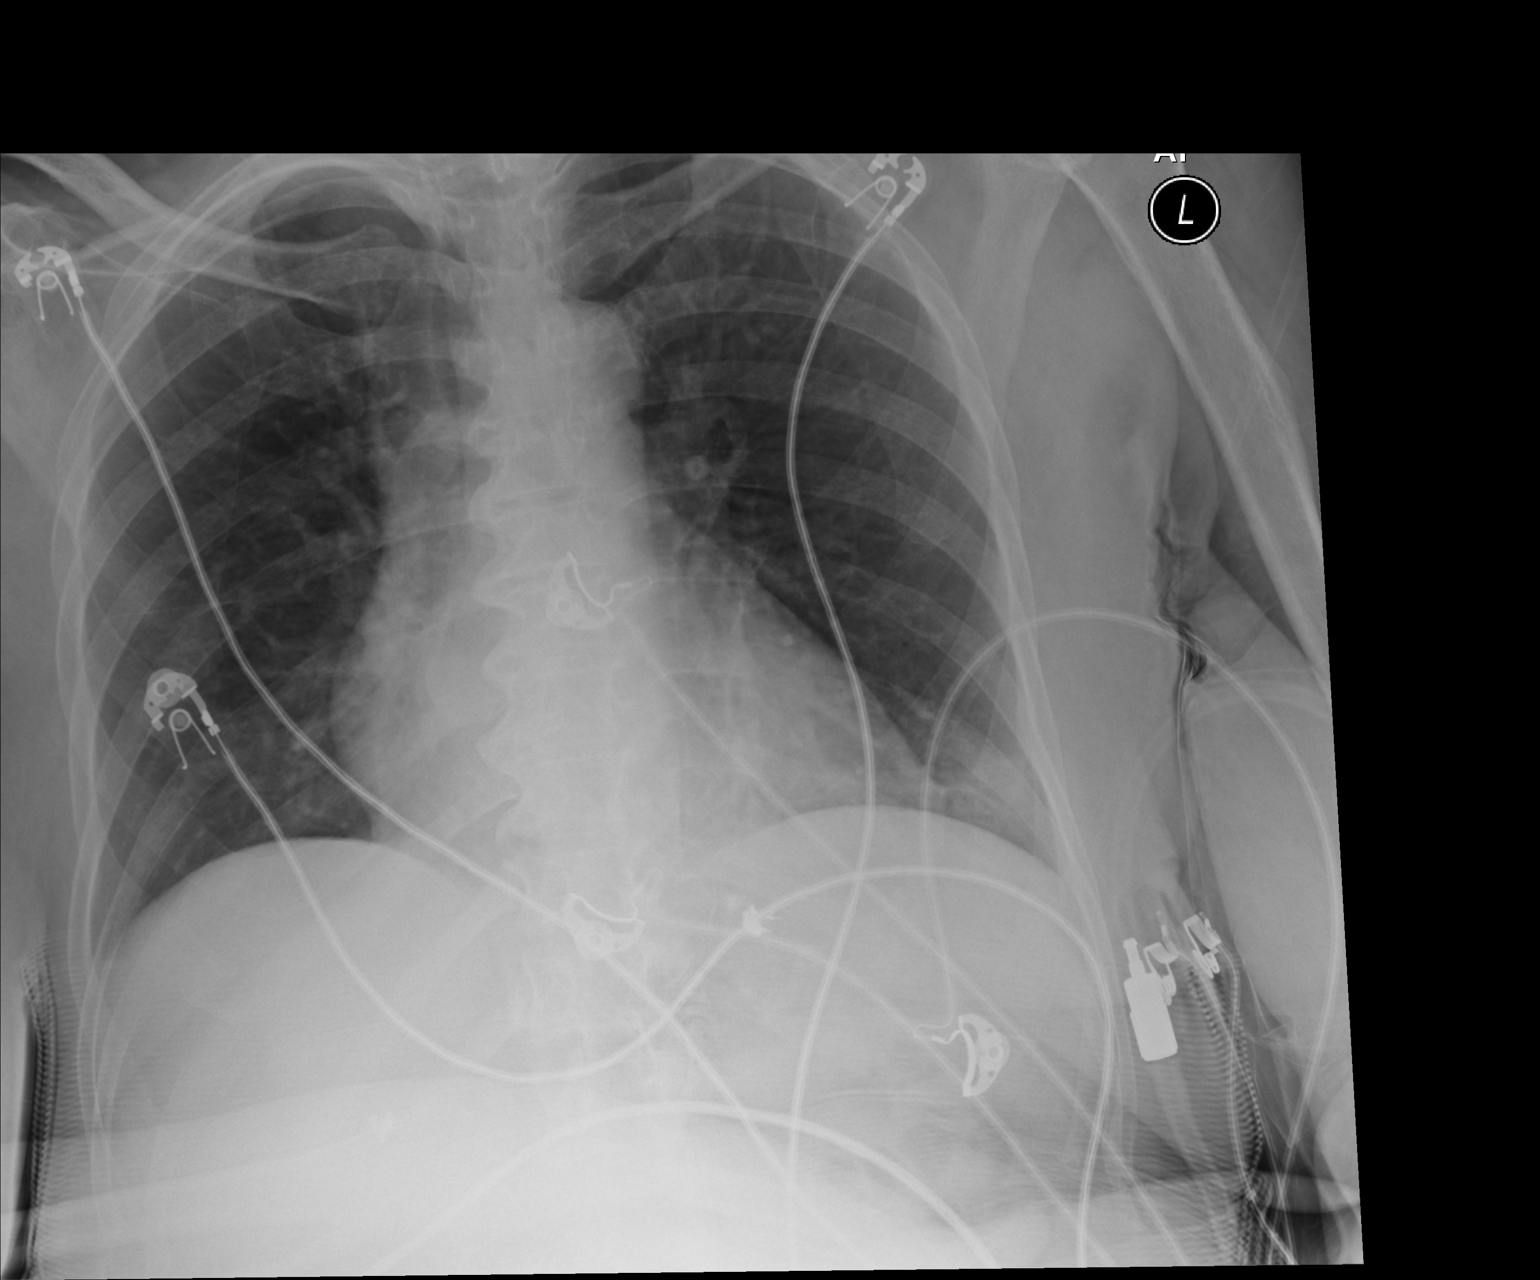

[1 of 1 positions shown; findings below may reference images not displayed]

FINDINGS: Cardiac and mediastinal silhouettes are stable in size and contour,
and remain within normal limits. Right-sided PICC catheter has been
removed.

Lungs are normally inflated. Previously seen bilateral pleural
effusions are no longer visualized. There is persistent patchy left
basilar opacity, suspicious for possible infiltrate or atelectasis.
No other focal airspace disease identified. No pulmonary edema or
pneumothorax.

Degenerative changes noted within the thoracic spine.
IMPRESSION: 1. Persistent patchy left basilar opacity. While this finding may in
part reflect residual atelectasis, possible infiltrate could be
considered given the history of fever.
2. Interval resolution in bilateral pleural effusions.

## 2014-06-17 IMAGING — DX DG CHEST 1V PORT
1 series · 1 of 1 positions shown · non-contrast
Comparison: Portable chest x-ray of 07/25/2013

CLINICAL DATA: History of pneumonia, weakness, lethargy

EXAM:
PORTABLE CHEST - 1 VIEW

[portable]
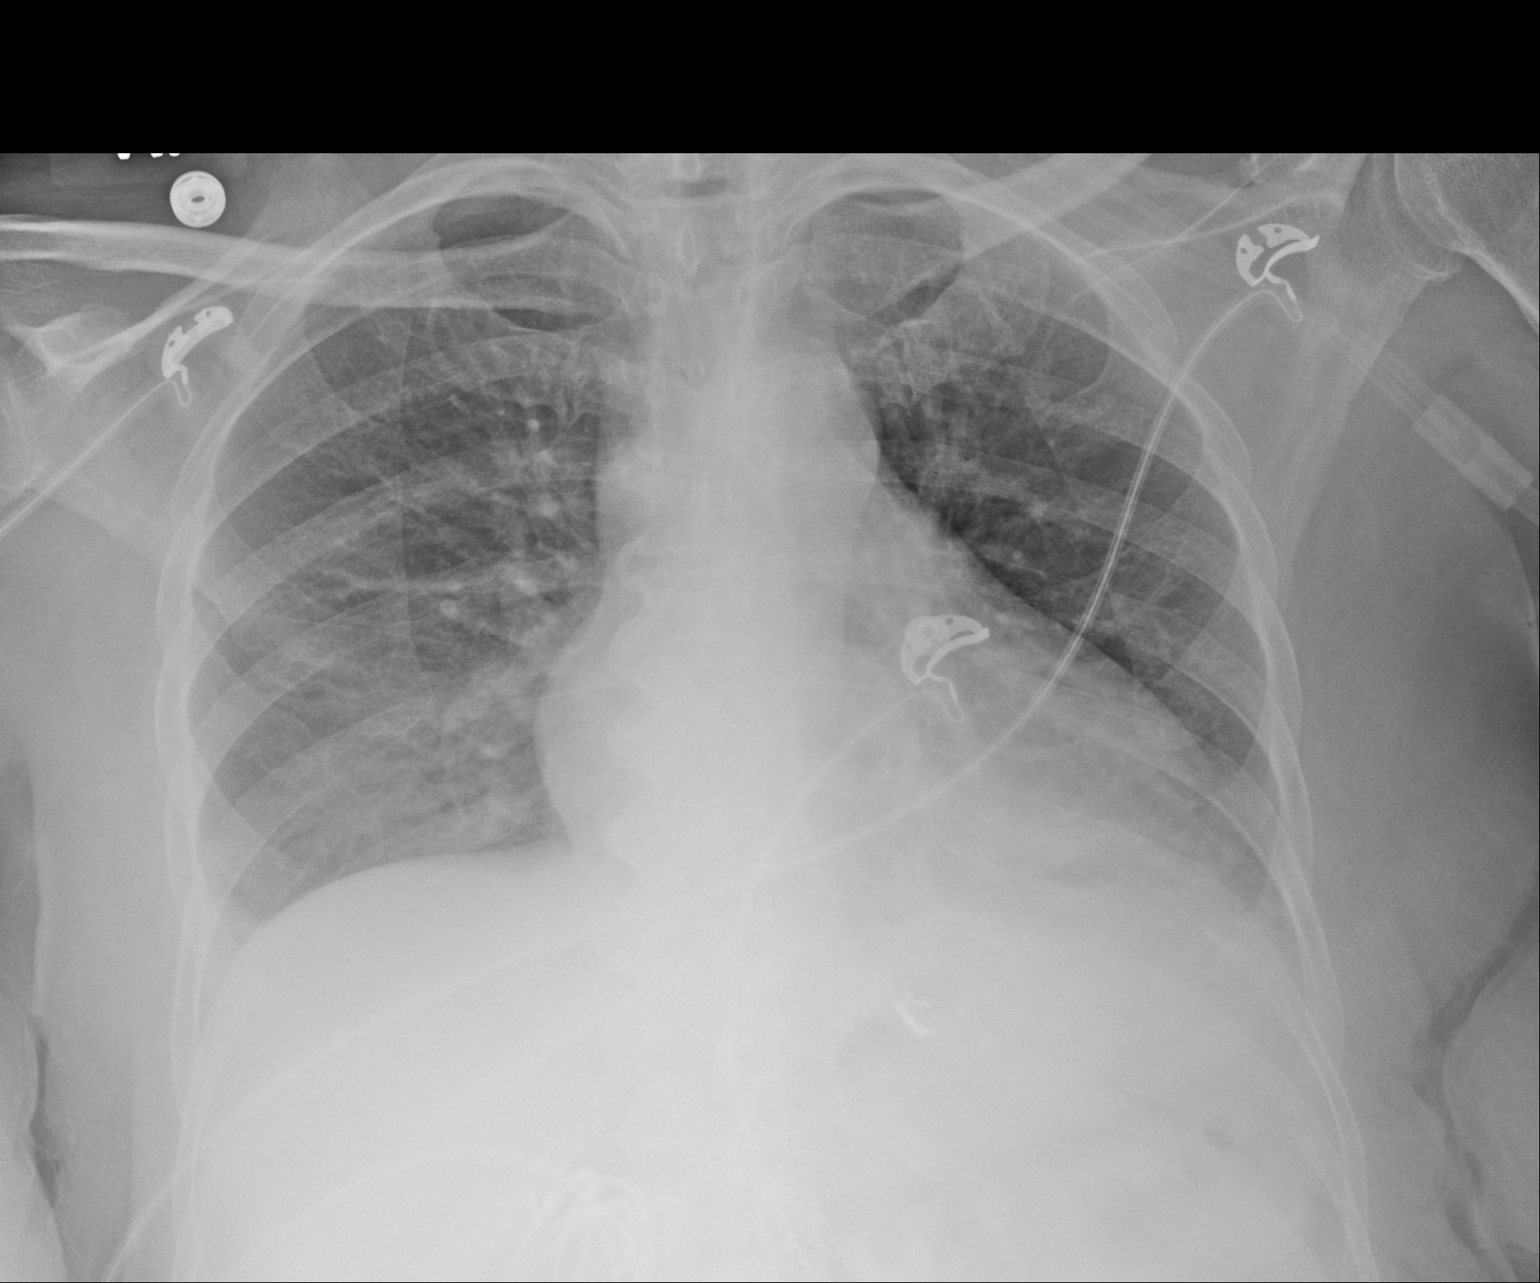

[1 of 1 positions shown; findings below may reference images not displayed]

FINDINGS: There is more opacity at both lung bases most consistent with
developing pneumonia. Some of the opacity may be due to atelectasis
and possible small effusions. Cardiomegaly is stable. There are
degenerative changes throughout the thoracic spine.
IMPRESSION: Increase in opacity at the lung bases suspicious for pneumonia.
Cannot exclude small effusions.

## 2014-08-19 ENCOUNTER — Other Ambulatory Visit: Payer: Self-pay | Admitting: Neurology

## 2014-09-29 ENCOUNTER — Encounter (HOSPITAL_COMMUNITY): Payer: Self-pay | Admitting: Vascular Surgery

## 2014-09-29 ENCOUNTER — Inpatient Hospital Stay (HOSPITAL_COMMUNITY)
Admission: EM | Admit: 2014-09-29 | Discharge: 2014-10-07 | DRG: 871 | Disposition: A | Discharge status: Home / Self Care | Payer: Medicare Other | Attending: Internal Medicine | Admitting: Internal Medicine

## 2014-09-29 ENCOUNTER — Emergency Department (HOSPITAL_COMMUNITY): Payer: Medicare Other

## 2014-09-29 DIAGNOSIS — D649 Anemia, unspecified: Secondary | ICD-10-CM | POA: Diagnosis present

## 2014-09-29 DIAGNOSIS — Z7401 Bed confinement status: Secondary | ICD-10-CM

## 2014-09-29 DIAGNOSIS — B952 Enterococcus as the cause of diseases classified elsewhere: Secondary | ICD-10-CM | POA: Diagnosis present

## 2014-09-29 DIAGNOSIS — N390 Urinary tract infection, site not specified: Secondary | ICD-10-CM | POA: Diagnosis present

## 2014-09-29 DIAGNOSIS — G40909 Epilepsy, unspecified, not intractable, without status epilepticus: Secondary | ICD-10-CM | POA: Diagnosis present

## 2014-09-29 DIAGNOSIS — L304 Erythema intertrigo: Secondary | ICD-10-CM | POA: Diagnosis present

## 2014-09-29 DIAGNOSIS — F419 Anxiety disorder, unspecified: Secondary | ICD-10-CM | POA: Diagnosis present

## 2014-09-29 DIAGNOSIS — M797 Fibromyalgia: Secondary | ICD-10-CM | POA: Diagnosis present

## 2014-09-29 DIAGNOSIS — Z885 Allergy status to narcotic agent status: Secondary | ICD-10-CM

## 2014-09-29 DIAGNOSIS — Z8673 Personal history of transient ischemic attack (TIA), and cerebral infarction without residual deficits: Secondary | ICD-10-CM

## 2014-09-29 DIAGNOSIS — Z7952 Long term (current) use of systemic steroids: Secondary | ICD-10-CM

## 2014-09-29 DIAGNOSIS — Z881 Allergy status to other antibiotic agents status: Secondary | ICD-10-CM

## 2014-09-29 DIAGNOSIS — L89154 Pressure ulcer of sacral region, stage 4: Secondary | ICD-10-CM | POA: Diagnosis present

## 2014-09-29 DIAGNOSIS — Z9884 Bariatric surgery status: Secondary | ICD-10-CM

## 2014-09-29 DIAGNOSIS — A419 Sepsis, unspecified organism: Secondary | ICD-10-CM | POA: Diagnosis not present

## 2014-09-29 DIAGNOSIS — G629 Polyneuropathy, unspecified: Secondary | ICD-10-CM | POA: Diagnosis present

## 2014-09-29 DIAGNOSIS — E872 Acidosis, unspecified: Secondary | ICD-10-CM | POA: Diagnosis present

## 2014-09-29 DIAGNOSIS — R4182 Altered mental status, unspecified: Secondary | ICD-10-CM

## 2014-09-29 DIAGNOSIS — R197 Diarrhea, unspecified: Secondary | ICD-10-CM | POA: Diagnosis present

## 2014-09-29 DIAGNOSIS — D6959 Other secondary thrombocytopenia: Secondary | ICD-10-CM | POA: Diagnosis present

## 2014-09-29 DIAGNOSIS — E870 Hyperosmolality and hypernatremia: Secondary | ICD-10-CM | POA: Diagnosis present

## 2014-09-29 DIAGNOSIS — Z888 Allergy status to other drugs, medicaments and biological substances status: Secondary | ICD-10-CM

## 2014-09-29 DIAGNOSIS — Z66 Do not resuscitate: Secondary | ICD-10-CM | POA: Diagnosis present

## 2014-09-29 DIAGNOSIS — K922 Gastrointestinal hemorrhage, unspecified: Secondary | ICD-10-CM | POA: Diagnosis present

## 2014-09-29 DIAGNOSIS — Z90710 Acquired absence of both cervix and uterus: Secondary | ICD-10-CM

## 2014-09-29 DIAGNOSIS — D696 Thrombocytopenia, unspecified: Secondary | ICD-10-CM | POA: Diagnosis present

## 2014-09-29 DIAGNOSIS — G9340 Encephalopathy, unspecified: Secondary | ICD-10-CM | POA: Diagnosis present

## 2014-09-29 DIAGNOSIS — I959 Hypotension, unspecified: Secondary | ICD-10-CM

## 2014-09-29 DIAGNOSIS — Z880 Allergy status to penicillin: Secondary | ICD-10-CM

## 2014-09-29 DIAGNOSIS — I10 Essential (primary) hypertension: Secondary | ICD-10-CM | POA: Diagnosis present

## 2014-09-29 DIAGNOSIS — R532 Functional quadriplegia: Secondary | ICD-10-CM | POA: Diagnosis present

## 2014-09-29 DIAGNOSIS — L03311 Cellulitis of abdominal wall: Secondary | ICD-10-CM | POA: Diagnosis present

## 2014-09-29 DIAGNOSIS — E876 Hypokalemia: Secondary | ICD-10-CM | POA: Diagnosis present

## 2014-09-29 DIAGNOSIS — G0490 Encephalitis and encephalomyelitis, unspecified: Secondary | ICD-10-CM | POA: Diagnosis present

## 2014-09-29 DIAGNOSIS — N179 Acute kidney failure, unspecified: Secondary | ICD-10-CM | POA: Diagnosis present

## 2014-09-29 DIAGNOSIS — E860 Dehydration: Secondary | ICD-10-CM | POA: Diagnosis present

## 2014-09-29 DIAGNOSIS — R410 Disorientation, unspecified: Secondary | ICD-10-CM | POA: Diagnosis not present

## 2014-09-29 DIAGNOSIS — R569 Unspecified convulsions: Secondary | ICD-10-CM

## 2014-09-29 MED ORDER — SODIUM CHLORIDE 0.9 % IV BOLUS (SEPSIS)
1000.0000 mL | INTRAVENOUS | Status: AC
  Administered 2014-09-29 – 2014-09-30 (×2): 1000 mL via INTRAVENOUS

## 2014-09-29 MED ORDER — LEVOFLOXACIN IN D5W 750 MG/150ML IV SOLN
750.0000 mg | Freq: Once | INTRAVENOUS | Status: AC
Start: 2014-09-29 — End: 2014-09-30
  Administered 2014-09-29: 750 mg via INTRAVENOUS
  Filled 2014-09-29: qty 150

## 2014-09-29 MED ORDER — VANCOMYCIN HCL IN DEXTROSE 1-5 GM/200ML-% IV SOLN
1000.0000 mg | Freq: Once | INTRAVENOUS | Status: AC
  Administered 2014-09-29: 1000 mg via INTRAVENOUS
  Filled 2014-09-29: qty 200

## 2014-09-29 MED ORDER — DEXTROSE 5 % IV SOLN
2.0000 g | Freq: Once | INTRAVENOUS | Status: AC
  Administered 2014-09-29: 2 g via INTRAVENOUS
  Filled 2014-09-29: qty 2

## 2014-09-29 MED ORDER — SODIUM CHLORIDE 0.9 % IV BOLUS (SEPSIS)
500.0000 mL | INTRAVENOUS | Status: AC
  Administered 2014-09-30: 500 mL via INTRAVENOUS

## 2014-09-29 NOTE — ED Notes (Signed)
Pt reports to the ED for eval of AMS. Per son pt has been non-verbal and trembling x 4 days. He states normally she is verbal and without tremors. Pt completely non-verbal at this time. She is tachycardic at 160s-170s en route. HEr skin is hot and dry. She is currently on Bactrim for a UTI. Pt opens her eyes to verbal stimuli. Resp e/u.

## 2014-09-29 NOTE — ED Provider Notes (Signed)
CSN: [id number]     Arrival date & time 09/29/14  2251 History   First MD Initiated Contact with Patient 09/29/14 2303     This chart was scribed for Joan Clos, MD by Arlan Organ, ED Scribe. This patient was seen in room 2H13C/2H13C-01 and the patient's care was started 11:10 PM.   Chief Complaint  Patient presents with  . Altered Mental Status   No language interpreter was used.    LEVEL 5 CAVEAT- PT IS NONVERBAL  HPI Comments: Joan [name] brought in by EMS is a 54 y.o. female with a PMHx of fibromyalgia, HTN, peripheral neuropathy, anxiety, and seizures who presents to the Emergency Department here for altered mental status x 4 days. Per son, pt has been non-verbal and "shaking/trembling" in last few days. Typically at baseline she speaks and without history of tremors. Currently she is being treated for a urinary tract infection with Bactrim.  Past Medical History  Diagnosis Date  . Peripheral neuropathy   . Fibromyalgia   . Hypertension   . Anxiety   . Catatonia   . Seizures    Past Surgical History  Procedure Laterality Date  . Gastric bypass    . Abdominal hysterectomy    . Jejunostomy N/A 06/25/2013    Procedure:  OPEN JEJUNOSTOMY FEEDING TUBE ;  Surgeon: Cherylynn Ridges, MD;  Location: [facility] OR;  Service: General;  Laterality: N/A;  . Laparotomy N/A 07/02/2013    Procedure: EXPLORATORY LAPAROTOMY with revision of jejunostomy feeding tube.;  Surgeon: Cherylynn Ridges, MD;  Location: [facility] OR;  Service: General;  Laterality: N/A;  . Bowel resection N/A 07/02/2013    Procedure: SMALL BOWEL RESECTION;  Surgeon: Cherylynn Ridges, MD;  Location: [facility] OR;  Service: General;  Laterality: N/A;   Family History  Problem Relation Age of Onset  . Hypertension Mother   . Diabetes Father    History  Substance Use Topics  . Smoking status: Never Smoker   . Smokeless tobacco: Never Used  . Alcohol Use: No   OB History    No data available     Review of Systems  Unable to perform ROS:  Patient nonverbal      Allergies  Penicillins; Prednisone; Citalopram; Clindamycin; Duloxetine hcl; Escitalopram; Hydromorphone; Morphine; Paroxetine; Sertraline; Sulfamethoxazole-trimethoprim; Tape; Clarithromycin; Doxycycline; Metoclopramide; Morphine and related; and Paroxetine hcl  Home Medications   Prior to Admission medications   Medication Sig Start Date End Date Taking? Authorizing Provider  acetaminophen (TYLENOL) 160 MG/5ML solution Take 480 mg by mouth every 6 (six) hours as needed for mild pain.    Historical Provider, MD  clonazePAM (KLONOPIN) 0.5 MG disintegrating tablet Place 0.5 mg into feeding tube 3 (three) times daily.  12/04/13   Dorothea Ogle, MD  ENSURE (ENSURE) Take 237 mLs by mouth 3 (three) times daily between meals.    Historical Provider, MD  hydrALAZINE (APRESOLINE) 10 MG tablet Place 10 mg into feeding tube 3 (three) times daily.  12/04/13   Dorothea Ogle, MD  lacosamide (VIMPAT) 200 MG TABS tablet Place 200 mg into feeding tube 2 (two) times daily.  08/27/13   Alison Murray, MD  levETIRAcetam (KEPPRA) 100 MG/ML solution Place 1,500 mg into feeding tube 2 (two) times daily.  12/04/13   Dorothea Ogle, MD  LORazepam (ATIVAN) 2 MG tablet Place 2 mg into feeding tube every 6 (six) hours as needed for anxiety.     Historical Provider, MD  metoprolol tartrate (LOPRESSOR) 25  mg/10 mL SUSP Place 50 mg into feeding tube 2 (two) times daily.  12/04/13   Dorothea Ogle, MD  mirtazapine (REMERON SOL-TAB) 15 MG disintegrating tablet Place 15 mg into feeding tube at bedtime.  07/30/13   Russella Dar, NP  Nutritional Supplements (FEEDING SUPPLEMENT, JEVITY 1.2 CAL,) LIQD Place 1,000 mLs into feeding tube continuous.    Historical Provider, MD  oxyCODONE-acetaminophen (PERCOCET/ROXICET) 5-325 MG per tablet Place 1 tablet into feeding tube every 6 (six) hours as needed for moderate pain or severe pain.     Historical Provider, MD  pantoprazole (PROTONIX) 40 MG injection Inject 40 mg into  the vein every 12 (twelve) hours. 08/27/13   Alison Murray, MD  potassium chloride (KLOR-CON) 20 MEQ packet Place 20 mEq into feeding tube daily.     Historical Provider, MD  risperiDONE (RISPERDAL) 1 MG tablet Place 1 tablet (1 mg total) into feeding tube 2 (two) times daily. Via feeding tube 12/04/13   Dorothea Ogle, MD  Valproic Acid (DEPAKENE) 250 MG/5ML SYRP syrup Place 750 mg into feeding tube every 8 (eight) hours.  08/27/13   Alison Murray, MD  Water For Irrigation, Sterile (FREE WATER) SOLN Place 200 mLs into feeding tube 4 (four) times daily. 08/27/13   Alison Murray, MD  zolpidem (AMBIEN) 5 MG tablet Take 5 mg by mouth at bedtime as needed for sleep.    Historical Provider, MD   Triage Vitals: BP 147/83 mmHg  Pulse 91  Temp(Src) 98.3 F (36.8 C) (Oral)  Resp 14  Ht  (1.651 m)  Wt 132 lb 7.9 oz (60.1 kg)  BMI 22.05 kg/m2  SpO2 100%   Physical Exam  Constitutional: She appears well-developed and well-nourished. No distress.  Ill-appearing. Contracted extremities.   HENT:  Head: Normocephalic and atraumatic.  Mouth/Throat: Oropharynx is clear and moist.  Erythematous oropharynx with thrush  Eyes: EOM are normal. Pupils are equal, round, and reactive to light.  Pupils 3 mm bilaterally and reactive  Neck: Normal range of motion. Neck supple.  Cardiovascular: Regular rhythm.  Exam reveals no gallop and no friction rub.   No murmur heard. Tachycardia  Pulmonary/Chest: Effort normal and breath sounds normal. No respiratory distress. She has no wheezes. She has no rales. She exhibits no tenderness.  Abdominal: Soft. Bowel sounds are normal. She exhibits no distension and no mass. There is no tenderness. There is no rebound and no guarding.  Musculoskeletal: She exhibits no edema or tenderness.  Contracted upper and lower extremities with atrophy. Distal pulses intact.  Neurological: She is alert.  Follow some simple commands. Nonverbal.  Skin: Skin is warm and dry. Rash noted.  No erythema.  Erythematous rash in the intertriginous areas of the groin, the intergluteal cleft and low back. Consistent with candidal infection  Nursing note and vitals reviewed.   ED Course  Procedures (including critical care time)  DIAGNOSTIC STUDIES:   COORDINATION OF CARE: 11:16 PM-Discussed treatment plan with pt at bedside and pt agreed to plan.     Labs Review Labs Reviewed  COMPREHENSIVE METABOLIC PANEL - Abnormal; Notable for the following:    Sodium 133 (*)    Chloride 92 (*)    Glucose, Bld 120 (*)    Creatinine, Ser 1.08 (*)    Calcium 8.6 (*)    Total Protein 5.0 (*)    Albumin 2.4 (*)    AST 65 (*)    Total Bilirubin 1.4 (*)    GFR calc  non Af Amer 58 (*)    All other components within normal limits  URINALYSIS, ROUTINE W REFLEX MICROSCOPIC - Abnormal; Notable for the following:    Color, Urine ORANGE (*)    APPearance CLOUDY (*)    Bilirubin Urine SMALL (*)    Ketones, ur 15 (*)    Leukocytes, UA SMALL (*)    All other components within normal limits  CBC WITH DIFFERENTIAL/PLATELET - Abnormal; Notable for the following:    RBC 2.72 (*)    Hemoglobin 8.9 (*)    HCT 26.1 (*)    RDW 18.1 (*)    Platelets 32 (*)    Monocytes Relative 14 (*)    All other components within normal limits  URINE MICROSCOPIC-ADD ON - Abnormal; Notable for the following:    Squamous Epithelial / LPF FEW (*)    Bacteria, UA MANY (*)    Casts HYALINE CASTS (*)    Crystals URIC ACID CRYSTALS (*)    All other components within normal limits  LACTATE DEHYDROGENASE - Abnormal; Notable for the following:    LDH 214 (*)    All other components within normal limits  I-STAT CG4 LACTIC ACID, ED - Abnormal; Notable for the following:    Lactic Acid, Venous 2.71 (*)    All other components within normal limits  I-STAT CG4 LACTIC ACID, ED - Abnormal; Notable for the following:    Lactic Acid, Venous 4.15 (*)    All other components within normal limits  POC OCCULT BLOOD, ED -  Abnormal; Notable for the following:    Fecal Occult Bld POSITIVE (*)    All other components within normal limits  CULTURE, BLOOD (ROUTINE X 2)  CULTURE, BLOOD (ROUTINE X 2)  CLOSTRIDIUM DIFFICILE BY PCR  URINE CULTURE  MRSA PCR SCREENING  VALPROIC ACID LEVEL  CBC WITH DIFFERENTIAL/PLATELET  PROCALCITONIN  AMMONIA  HIV ANTIBODY (ROUTINE TESTING)  CBC  CBC  CBC  COMPREHENSIVE METABOLIC PANEL  LACTIC ACID, PLASMA  LACTIC ACID, PLASMA  PROTIME-INR  APTT  TYPE AND SCREEN    Imaging Review Ct Head Wo Contrast  09/30/2014   CLINICAL DATA:  Acute encephalopathy.  EXAM: CT HEAD WITHOUT CONTRAST  TECHNIQUE: Contiguous axial images were obtained from the base of the skull through the vertex without intravenous contrast.  COMPARISON:  12/03/2013 MRI  FINDINGS: There is no intracranial hemorrhage, mass or evidence of acute infarction. There is no extra-axial fluid collection. There is moderately severe generalized atrophy and moderate periventricular hypodensity which likely represents chronic small vessel disease. No superimposed acute findings are evident. No bony abnormalities are evident. The visible portions of the paranasal sinuses clear.  IMPRESSION: Moderately severe generalized atrophy and chronic small vessel disease. No acute findings.   Electronically Signed   By: Ellery Plunk M.D.   On: 09/30/2014 04:45   Dg Chest Port 1 View  09/29/2014   CLINICAL DATA:  Altered mental status, some short of breath  EXAM: PORTABLE CHEST - 1 VIEW  COMPARISON:  Radiograph 01/03/2014  FINDINGS: Normal mediastinum and cardiac silhouette. Normal pulmonary vasculature. No evidence of effusion, infiltrate, or pneumothorax. No acute bony abnormality.  IMPRESSION: No acute cardiopulmonary process.   Electronically Signed   By: Genevive Bi M.D.   On: 09/29/2014 23:49     EKG Interpretation   Date/Time:  Monday Sep 29 2014 23:02:59 EDT Ventricular Rate:  135 PR Interval:  126 QRS Duration: 78 QT  Interval:  356 QTC Calculation: 534 R Axis:  62 Text Interpretation:  Age not entered, assumed to be  54 years old for  purpose of ECG interpretation Sinus tachycardia Consider right atrial  enlargement Repol abnrm, prob ischemia, anterolateral lds Prolonged QT  interval Artifact in lead(s) I [name] aVL V2 Confirmed by Ranae PalmsYELVERTON  MD,  Keyra Virella ([id number]) on 09/29/2014 11:08:41 PM      MDM   Final diagnoses:  Hypotension, unspecified hypotension type  Altered mental status, unspecified altered mental status type    I personally performed the services described in this documentation, which was scribed in my presence. The recorded information has been reviewed and is accurate.   Based on vital signs, sepsis initiated. Suspect likely urosepsis given recent UTI.  Improvement of patient's blood pressure after initial IV fluid bolus. Broad-spectrum antibiotics initiated. Discuss with [facility] and will admit to step down bed.  Loren Raceravid Tanielle Emigh, MD 09/30/14 [phone]

## 2014-09-30 ENCOUNTER — Inpatient Hospital Stay (HOSPITAL_COMMUNITY): Payer: Medicare Other

## 2014-09-30 ENCOUNTER — Encounter (HOSPITAL_COMMUNITY): Payer: Self-pay | Admitting: Internal Medicine

## 2014-09-30 DIAGNOSIS — Z888 Allergy status to other drugs, medicaments and biological substances status: Secondary | ICD-10-CM | POA: Diagnosis not present

## 2014-09-30 DIAGNOSIS — Z66 Do not resuscitate: Secondary | ICD-10-CM | POA: Diagnosis present

## 2014-09-30 DIAGNOSIS — F419 Anxiety disorder, unspecified: Secondary | ICD-10-CM | POA: Diagnosis present

## 2014-09-30 DIAGNOSIS — N179 Acute kidney failure, unspecified: Secondary | ICD-10-CM

## 2014-09-30 DIAGNOSIS — E876 Hypokalemia: Secondary | ICD-10-CM | POA: Diagnosis not present

## 2014-09-30 DIAGNOSIS — N390 Urinary tract infection, site not specified: Secondary | ICD-10-CM | POA: Diagnosis present

## 2014-09-30 DIAGNOSIS — Z90710 Acquired absence of both cervix and uterus: Secondary | ICD-10-CM | POA: Diagnosis not present

## 2014-09-30 DIAGNOSIS — E870 Hyperosmolality and hypernatremia: Secondary | ICD-10-CM | POA: Diagnosis present

## 2014-09-30 DIAGNOSIS — E872 Acidosis: Secondary | ICD-10-CM | POA: Diagnosis not present

## 2014-09-30 DIAGNOSIS — R410 Disorientation, unspecified: Secondary | ICD-10-CM | POA: Diagnosis present

## 2014-09-30 DIAGNOSIS — L89154 Pressure ulcer of sacral region, stage 4: Secondary | ICD-10-CM | POA: Diagnosis present

## 2014-09-30 DIAGNOSIS — E860 Dehydration: Secondary | ICD-10-CM | POA: Diagnosis present

## 2014-09-30 DIAGNOSIS — K922 Gastrointestinal hemorrhage, unspecified: Secondary | ICD-10-CM | POA: Diagnosis present

## 2014-09-30 DIAGNOSIS — A419 Sepsis, unspecified organism: Secondary | ICD-10-CM | POA: Diagnosis present

## 2014-09-30 DIAGNOSIS — B952 Enterococcus as the cause of diseases classified elsewhere: Secondary | ICD-10-CM | POA: Diagnosis present

## 2014-09-30 DIAGNOSIS — D649 Anemia, unspecified: Secondary | ICD-10-CM | POA: Diagnosis present

## 2014-09-30 DIAGNOSIS — I959 Hypotension, unspecified: Secondary | ICD-10-CM | POA: Insufficient documentation

## 2014-09-30 DIAGNOSIS — I10 Essential (primary) hypertension: Secondary | ICD-10-CM | POA: Diagnosis present

## 2014-09-30 DIAGNOSIS — G0490 Encephalitis and encephalomyelitis, unspecified: Secondary | ICD-10-CM | POA: Diagnosis not present

## 2014-09-30 DIAGNOSIS — L03311 Cellulitis of abdominal wall: Secondary | ICD-10-CM | POA: Diagnosis present

## 2014-09-30 DIAGNOSIS — Z9884 Bariatric surgery status: Secondary | ICD-10-CM | POA: Diagnosis not present

## 2014-09-30 DIAGNOSIS — Z881 Allergy status to other antibiotic agents status: Secondary | ICD-10-CM | POA: Diagnosis not present

## 2014-09-30 DIAGNOSIS — Z7952 Long term (current) use of systemic steroids: Secondary | ICD-10-CM | POA: Diagnosis not present

## 2014-09-30 DIAGNOSIS — G629 Polyneuropathy, unspecified: Secondary | ICD-10-CM | POA: Diagnosis present

## 2014-09-30 DIAGNOSIS — Z8673 Personal history of transient ischemic attack (TIA), and cerebral infarction without residual deficits: Secondary | ICD-10-CM | POA: Diagnosis not present

## 2014-09-30 DIAGNOSIS — D6959 Other secondary thrombocytopenia: Secondary | ICD-10-CM | POA: Diagnosis present

## 2014-09-30 DIAGNOSIS — M797 Fibromyalgia: Secondary | ICD-10-CM | POA: Diagnosis present

## 2014-09-30 DIAGNOSIS — G9340 Encephalopathy, unspecified: Secondary | ICD-10-CM | POA: Diagnosis not present

## 2014-09-30 DIAGNOSIS — R197 Diarrhea, unspecified: Secondary | ICD-10-CM | POA: Diagnosis not present

## 2014-09-30 DIAGNOSIS — Z880 Allergy status to penicillin: Secondary | ICD-10-CM | POA: Diagnosis not present

## 2014-09-30 DIAGNOSIS — L304 Erythema intertrigo: Secondary | ICD-10-CM | POA: Diagnosis present

## 2014-09-30 DIAGNOSIS — Z885 Allergy status to narcotic agent status: Secondary | ICD-10-CM | POA: Diagnosis not present

## 2014-09-30 DIAGNOSIS — R532 Functional quadriplegia: Secondary | ICD-10-CM | POA: Diagnosis not present

## 2014-09-30 DIAGNOSIS — G40909 Epilepsy, unspecified, not intractable, without status epilepticus: Secondary | ICD-10-CM | POA: Diagnosis present

## 2014-09-30 DIAGNOSIS — Z7401 Bed confinement status: Secondary | ICD-10-CM | POA: Diagnosis not present

## 2014-09-30 LAB — COMPREHENSIVE METABOLIC PANEL
ALBUMIN: 2 g/dL — AB (ref 3.5–5.0)
ALBUMIN: 2.4 g/dL — AB (ref 3.5–5.0)
ALK PHOS: 66 U/L (ref 38–126)
ALT: 28 U/L (ref 14–54)
ALT: 23 U/L (ref 14–54)
ANION GAP: 11 (ref 5–15)
AST: 65 U/L — ABNORMAL HIGH (ref 15–41)
AST: 55 U/L — ABNORMAL HIGH (ref 15–41)
Alkaline Phosphatase: 49 U/L (ref 38–126)
Anion gap: 14 (ref 5–15)
BILIRUBIN TOTAL: 1.4 mg/dL — AB (ref 0.3–1.2)
BUN: 14 mg/dL (ref 6–20)
BUN: 9 mg/dL (ref 6–20)
CHLORIDE: 92 mmol/L — AB (ref 101–111)
CO2: 23 mmol/L (ref 22–32)
CO2: 27 mmol/L (ref 22–32)
CREATININE: 1.08 mg/dL — AB (ref 0.44–1.00)
Calcium: 8.6 mg/dL — ABNORMAL LOW (ref 8.9–10.3)
Calcium: 7.3 mg/dL — ABNORMAL LOW (ref 8.9–10.3)
Chloride: 103 mmol/L (ref 101–111)
Creatinine, Ser: 0.73 mg/dL (ref 0.44–1.00)
GFR calc Af Amer: >60 mL/min (ref >60)
GFR calc non Af Amer: >60 mL/min (ref >60)
GFR, EST NON AFRICAN AMERICAN: 58 mL/min — AB (ref >60)
Glucose, Bld: 120 mg/dL — ABNORMAL HIGH (ref 70–99)
Glucose, Bld: 65 mg/dL — ABNORMAL LOW (ref 70–99)
Potassium: 3.7 mmol/L (ref 3.5–5.1)
Potassium: 3.3 mmol/L — ABNORMAL LOW (ref 3.5–5.1)
SODIUM: 133 mmol/L — AB (ref 135–145)
Sodium: 137 mmol/L (ref 135–145)
TOTAL PROTEIN: 4.4 g/dL — AB (ref 6.5–8.1)
Total Bilirubin: 1.1 mg/dL (ref 0.3–1.2)
Total Protein: 5 g/dL — ABNORMAL LOW (ref 6.5–8.1)

## 2014-09-30 LAB — APTT: APTT: 25 s (ref 24–37)

## 2014-09-30 LAB — HIV ANTIBODY (ROUTINE TESTING W REFLEX): HIV Screen 4th Generation wRfx: NONREACTIVE

## 2014-09-30 LAB — URINALYSIS, ROUTINE W REFLEX MICROSCOPIC
Glucose, UA: NEGATIVE mg/dL
Hgb urine dipstick: NEGATIVE
Ketones, ur: 15 mg/dL — AB
NITRITE: NEGATIVE
PH: 5.5 (ref 5.0–8.0)
Protein, ur: NEGATIVE mg/dL
SPECIFIC GRAVITY, URINE: 1.027 (ref 1.005–1.030)
Urobilinogen, UA: 1 mg/dL (ref 0.0–1.0)

## 2014-09-30 LAB — CBC WITH DIFFERENTIAL/PLATELET
Basophils Absolute: 0 10*3/uL (ref 0.0–0.1)
Basophils Relative: 0 % (ref 0–1)
EOS PCT: 0 % (ref 0–5)
Eosinophils Absolute: 0 10*3/uL (ref 0.0–0.7)
HCT: 26.1 % — ABNORMAL LOW (ref 36.0–46.0)
Hemoglobin: 8.9 g/dL — ABNORMAL LOW (ref 12.0–15.0)
LYMPHS ABS: 1.2 10*3/uL (ref 0.7–4.0)
Lymphocytes Relative: 16 % (ref 12–46)
MCH: 32.7 pg (ref 26.0–34.0)
MCHC: 34.1 g/dL (ref 30.0–36.0)
MCV: 96 fL (ref 78.0–100.0)
MONOS PCT: 14 % — AB (ref 3–12)
Monocytes Absolute: 1 10*3/uL (ref 0.1–1.0)
NEUTROS PCT: 69 % (ref 43–77)
Neutro Abs: 4.9 10*3/uL (ref 1.7–7.7)
Platelets: 32 10*3/uL — ABNORMAL LOW (ref 150–400)
RBC: 2.72 MIL/uL — ABNORMAL LOW (ref 3.87–5.11)
RDW: 18.1 % — ABNORMAL HIGH (ref 11.5–15.5)
WBC: 7.1 10*3/uL (ref 4.0–10.5)

## 2014-09-30 LAB — I-STAT CG4 LACTIC ACID, ED
Lactic Acid, Venous: 2.71 mmol/L (ref 0.5–2.0)
Lactic Acid, Venous: 4.15 mmol/L (ref 0.5–2.0)

## 2014-09-30 LAB — CBC
HEMATOCRIT: 37.7 % (ref 36.0–46.0)
Hemoglobin: 12.9 g/dL (ref 12.0–15.0)
MCH: 32.3 pg (ref 26.0–34.0)
MCHC: 34.2 g/dL (ref 30.0–36.0)
MCV: 94.5 fL (ref 78.0–100.0)
PLATELETS: 41 10*3/uL — AB (ref 150–400)
RBC: 3.99 MIL/uL (ref 3.87–5.11)
RDW: 18.2 % — ABNORMAL HIGH (ref 11.5–15.5)
WBC: 7.3 10*3/uL (ref 4.0–10.5)

## 2014-09-30 LAB — URINE MICROSCOPIC-ADD ON

## 2014-09-30 LAB — POC OCCULT BLOOD, ED: Fecal Occult Bld: POSITIVE — AB

## 2014-09-30 LAB — VALPROIC ACID LEVEL: Valproic Acid Lvl: 100 ug/mL (ref 50.0–100.0)

## 2014-09-30 LAB — LACTIC ACID, PLASMA: Lactic Acid, Venous: 2.2 mmol/L (ref 0.5–2.0)

## 2014-09-30 LAB — MRSA PCR SCREENING: MRSA BY PCR: POSITIVE — AB

## 2014-09-30 LAB — TYPE AND SCREEN
ABO/RH(D): A POS
Antibody Screen: NEGATIVE

## 2014-09-30 LAB — PROCALCITONIN: Procalcitonin: <0.1 ng/mL

## 2014-09-30 LAB — PROTIME-INR
INR: 1.32 (ref 0.00–1.49)
Prothrombin Time: 16.5 seconds — ABNORMAL HIGH (ref 11.6–15.2)

## 2014-09-30 LAB — AMMONIA: Ammonia: 29 umol/L (ref 9–35)

## 2014-09-30 LAB — CLOSTRIDIUM DIFFICILE BY PCR: CDIFFPCR: NEGATIVE

## 2014-09-30 LAB — LACTATE DEHYDROGENASE: LDH: 214 U/L — ABNORMAL HIGH (ref 98–192)

## 2014-09-30 MED ORDER — SODIUM CHLORIDE 0.9 % IV SOLN
INTRAVENOUS | Status: DC
  Administered 2014-09-30: 05:00:00 via INTRAVENOUS

## 2014-09-30 MED ORDER — MUPIROCIN 2 % EX OINT
1.0000 "application " | TOPICAL_OINTMENT | Freq: Two times a day (BID) | CUTANEOUS | Status: AC
  Administered 2014-09-30 – 2014-10-04 (×10): 1 via NASAL
  Filled 2014-09-30 (×2): qty 22

## 2014-09-30 MED ORDER — SODIUM CHLORIDE 0.9 % IV SOLN
INTRAVENOUS | Status: AC
  Administered 2014-09-30: 03:00:00 via INTRAVENOUS

## 2014-09-30 MED ORDER — CHLORHEXIDINE GLUCONATE CLOTH 2 % EX PADS
6.0000 | MEDICATED_PAD | Freq: Every day | CUTANEOUS | Status: AC
  Administered 2014-09-30 – 2014-10-04 (×5): 6 via TOPICAL

## 2014-09-30 MED ORDER — METHYLPREDNISOLONE SODIUM SUCC 40 MG IJ SOLR
40.0000 mg | Freq: Every day | INTRAMUSCULAR | Status: DC
  Administered 2014-09-30: 40 mg via INTRAVENOUS
  Filled 2014-09-30: qty 1

## 2014-09-30 MED ORDER — SODIUM CHLORIDE 0.9 % IV SOLN: 200.0000 mg | Freq: Two times a day (BID) | INTRAVENOUS | Status: DC

## 2014-09-30 MED ORDER — FLUCONAZOLE 100MG IVPB
100.0000 mg | INTRAVENOUS | Status: DC
  Administered 2014-09-30 – 2014-10-03 (×4): 100 mg via INTRAVENOUS
  Filled 2014-09-30 (×5): qty 50

## 2014-09-30 MED ORDER — DEXTROSE 5 % IV SOLN
1.0000 g | Freq: Three times a day (TID) | INTRAVENOUS | Status: DC
  Administered 2014-09-30 – 2014-10-01 (×4): 1 g via INTRAVENOUS
  Filled 2014-09-30 (×6): qty 1

## 2014-09-30 MED ORDER — SODIUM CHLORIDE 0.9 % IV SOLN: 1500.0000 mg | Freq: Two times a day (BID) | INTRAVENOUS | Status: DC

## 2014-09-30 MED ORDER — HYDROCORTISONE NA SUCCINATE PF 100 MG IJ SOLR
50.0000 mg | Freq: Four times a day (QID) | INTRAMUSCULAR | Status: DC
  Administered 2014-09-30 – 2014-10-01 (×4): 50 mg via INTRAVENOUS
  Filled 2014-09-30 (×10): qty 1

## 2014-09-30 MED ORDER — VALPROATE SODIUM 500 MG/5ML IV SOLN
750.0000 mg | Freq: Three times a day (TID) | INTRAVENOUS | Status: DC
  Administered 2014-09-30 – 2014-10-01 (×4): 750 mg via INTRAVENOUS
  Filled 2014-09-30 (×6): qty 7.5

## 2014-09-30 MED ORDER — METRONIDAZOLE IN NACL 5-0.79 MG/ML-% IV SOLN
500.0000 mg | Freq: Three times a day (TID) | INTRAVENOUS | Status: DC
  Administered 2014-09-30 – 2014-10-01 (×4): 500 mg via INTRAVENOUS
  Filled 2014-09-30 (×6): qty 100

## 2014-09-30 MED ORDER — VANCOMYCIN HCL IN DEXTROSE 750-5 MG/150ML-% IV SOLN
750.0000 mg | INTRAVENOUS | Status: DC
  Administered 2014-09-30 – 2014-10-07 (×8): 750 mg via INTRAVENOUS
  Filled 2014-09-30 (×8): qty 150

## 2014-09-30 MED ORDER — SODIUM CHLORIDE 0.9 % IV BOLUS (SEPSIS)
2000.0000 mL | Freq: Once | INTRAVENOUS | Status: AC
  Administered 2014-09-30: 2000 mL via INTRAVENOUS

## 2014-09-30 NOTE — Progress Notes (Signed)
SLP Cancellation Note  Patient Details Name: Candida PeelingLora Chauvin MRN: [id number] DOB: 12/16/1960   Cancelled treatment:       Reason Eval/Treat Not Completed: Patient at procedure or test/unavailable. Will f/u later today.   Harlon DittyBonnie Tamsen Reist, [place] CCC-SLP [phone]  [name], Shaniquia Brafford Caroline 09/30/2014, 8:37 AM

## 2014-09-30 NOTE — Consult Note (Addendum)
WOC wound consult note Reason for Consult: Consult requested for buttocks and groin.  Pt has severe moisture associated skin damage and appearance consistent with candiditas to inner perineum, groin skin folds, and buttocks. Pressure Ulcer POA: There is a partial thickness fissure to gluteal fold R/T moisture, this is NOT a pressure ulcer. Measurement: Affected area across buttocks is approx 30X30cm Wound bed: Red, raw, moist in patchy areas of skin loss across buttocks and inner groin.  Mod amt yellow drainage weeping from sites. Dressing procedure/placement/frequency: Air mattress to increase airflow to affected areas.  Pt is on IV antifungal to promote healing. Interdry silver-impregnated fabric to provide antimicrobial benefits and wick drainage away from skin to inner perineum and groin. This should remain in place for 5 days for optimal plan of care. Instructions provided for staff nurse use. Barrier cream and antifungal powder to buttocks to protect and repel moisture.  Pt does not appear to understand when plan of care is discussed. Please re-consult if further assistance is needed.  Thank-you,  Cammie Mcgeeawn Vlasta Baskin MSN, RN, CWOCN, [place]AP, CNS [phone]

## 2014-09-30 NOTE — Progress Notes (Signed)
EEG Completed; Results Pending  

## 2014-09-30 NOTE — Procedures (Signed)
ELECTROENCEPHALOGRAM REPORT   Patient: Joan [name]       Room #: 2H-13 Age: 54 y.o.        Sex: female Referring Physician: Dr Kendrick FriesMcQuaid Report Date:  09/30/2014        Interpreting Physician: Omelia BlackwaterSUMNER, Joan [name] JUSTIN  History: Joan PeelingLora [name] is an 54 y.o. female with history of encephalitis of unclear etiology presenting with AMS and abnormal movements  Medications:  Scheduled: . aztreonam  1 g Intravenous 3 times per day  . Chlorhexidine Gluconate Cloth  6 each Topical Q0600  . hydrocortisone sod succinate (SOLU-CORTEF) inj  50 mg Intravenous Q6H  . metronidazole  500 mg Intravenous Q8H  . mupirocin ointment  1 application Nasal BID  . valproate sodium  750 mg Intravenous 3 times per day  . vancomycin  750 mg Intravenous Q24H    Conditions of Recording:  This is a 16 channel EEG carried out with the patient in the drowsy state.  Description:  The waking background activity consists of a low voltage, symmetrical, poorly organized theta activity with occasional delta activity intermixed in, seen from the parieto-occipital and posterior temporal regions. No posterior dominant rhythm is noted. Low voltage fast activity, poorly organized, is seen anteriorly and is at times superimposed on more posterior regions.  A mixture of theta and alpha rhythms are seen from the central and temporal regions.  Throughout the exam there is noted extremity twitching (predominantly of RUE) and chin movement. There is noted muscle artifact on the EEG but no noted seizure epileptic correlation to these movements.   Normal sleep architecture is not observed. Hyperventilation and intermittent photic stimulation was not performed.    IMPRESSION: Abnormal EEG due to presence of generalized slowing indicating a mild to moderate cerebral disturbance (encephalopathy). There does not appear to be an epileptic correlate to the abnormal movements noted during the exam.    Elspeth Choeter Destyne Goodreau,  DO [facility] [phone]  If 7pm- 7am, please page neurology on call as listed in AMION. 09/30/2014, 9:43 AM

## 2014-09-30 NOTE — Consult Note (Signed)
NEURO HOSPITALIST CONSULT NOTE    Reason for Consult: Encephalopathy  HPI:                                                                                                                                          [name] is an 54 y.o. female who is [facility]. Per records in chart, in 04/03/2014 "patient with history of catatonia, fibromyalgia who was admitted to [facility] in Dec 2014 with altered mental status and UTI. Altered mental status felt secondary to toxic metabolic encephalopathy vs viral encephalitis. There as acute kidney failure and electrolyte abnormalities. There was also a concern for seizure and she was started on AED's, although no electrographical seizures were captured. Initial MRI was concerning for symmetric abnormal diffusion, T2, and FLAIR signal in the pulvinar are and dorsomedial thalami. However, this has largely resolved on repeat MRI three days later. CSF concerning for elevated protein and lymphocytic pleocytosis suggesting an infectious process with negative bacterial and viral studies. Due to her lack of improvement in motor and cognition, neurodegenerative disease were also considered. In the several months following discharge, there was little change in her status. LTM in 05/22/14  demonstrated mild to moderate encephalopathy with sharp waves to left hemisphere. No seizures were noted. Ms. Wilson SingerHudson was noted to have a 45 second GTC seizure (05/25/14) which was likely in the setting of fever.  Previous Anti GAD Ab was positive on 03/2014 along with positive Speckled-pattern ANA with a titer of 80. The DDX included remote autoimmune encephalitis vs viral encephalitis vs Stiff Person Syndrome vs Severe Malnutrition vs Medication-induced vs vitamin deficiency.   Patient was [facility] in [facility] for neurodegenerative decline. 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 Pearlean BrownieSethi to Dr. Lindell Noeynthis Snider. Patient was  to follow up with Dr. Pearlean BrownieSethi however has not done so yet. patient was readmitted to hospital due to dehydration, hypernatremia and UTI.   Patient returns to the hospital after family has noted a decline in mentation and communication over the last 4 days. While hospitalized she has had negative UA, UC pending, Afebrile, WBC 7.3, ammonia 29, VPA 100, Blood culture pending, CT head with no acute findings. EEG showed slowing but no epileptiform activity.   Per nurse her buttocks and groin region have significant yeast and moister associated skin damage.   Past Medical History  Diagnosis Date  . Peripheral neuropathy   . Fibromyalgia   . Hypertension   . Anxiety   . Catatonia   . Seizures     Past Surgical History  Procedure Laterality Date  . Gastric bypass    . Abdominal hysterectomy    . Jejunostomy N/A 06/25/2013    Procedure:  OPEN JEJUNOSTOMY FEEDING TUBE ;  Surgeon: Cherylynn Ridges, MD;  Location: [facility] OR;  Service: General;  Laterality: N/A;  . Laparotomy N/A 07/02/2013    Procedure: EXPLORATORY LAPAROTOMY with revision of jejunostomy feeding tube.;  Surgeon: Cherylynn Ridges, MD;  Location: [facility] OR;  Service: General;  Laterality: N/A;  . Bowel resection N/A 07/02/2013    Procedure: SMALL BOWEL RESECTION;  Surgeon: Cherylynn Ridges, MD;  Location: [facility] OR;  Service: General;  Laterality: N/A;    Family History  Problem Relation Age of Onset  . Hypertension Mother   . Diabetes Father      Social History:  reports that she has never smoked. She has never used smokeless tobacco. She reports that she does not drink alcohol. Her drug history is not on file.  Allergies  Allergen Reactions  . Penicillins Other (See Comments), Rash and Shortness Of Breath    REACTION: Makes her body hurt.  . Prednisone Palpitations  . Citalopram Nausea And Vomiting  . Clindamycin Rash and Other (See Comments)    GI upset  . Duloxetine Hcl Other (See Comments)    GI upset   . Escitalopram Nausea And Vomiting   . Hydromorphone Other (See Comments)    Generalized severe pain  . Morphine Other (See Comments)    Generalized severe pain  . Paroxetine Other (See Comments)    GI upset  . Sertraline Other (See Comments)    GI upset  . Sulfamethoxazole-Trimethoprim Nausea And Vomiting  . Tape Dermatitis  . Clarithromycin Rash  . Doxycycline Rash and Other (See Comments)    GI upset  . Metoclopramide Anxiety  . Morphine And Related Other (See Comments)    REACTION: Causes pain  . Paroxetine Hcl Other (See Comments)    REACTION:  unknown    MEDICATIONS:                                                                                                                     Prior to Admission:  Prescriptions prior to admission  Medication Sig Dispense Refill Last Dose  . acetaminophen (TYLENOL) 160 MG/5ML solution Take 480 mg by mouth every 6 (six) hours as needed for mild pain.   unknown at unknown  . clonazePAM (KLONOPIN) 0.5 MG disintegrating tablet Place 0.5 mg into feeding tube 3 (three) times daily.    01/02/2014 at 2200  . ENSURE (ENSURE) Take 237 mLs by mouth 3 (three) times daily between meals.   01/02/2014 at 2200  . hydrALAZINE (APRESOLINE) 10 MG tablet Place 10 mg into feeding tube 3 (three) times daily.    01/02/2014 at 2200  . lacosamide (VIMPAT) 200 MG TABS tablet Place 200 mg into feeding tube 2 (two) times daily.    01/02/2014 at 2200  . levETIRAcetam (KEPPRA) 100 MG/ML solution Place 1,500 mg into feeding tube 2 (two) times daily.    01/02/2014 at 2200  . LORazepam (ATIVAN) 2 MG tablet Place 2 mg into feeding tube every 6 (six) hours as needed for anxiety.  unknown at unknown  . metoprolol tartrate (LOPRESSOR) 25 mg/10 mL SUSP Place 50 mg into feeding tube 2 (two) times daily.    01/02/2014 at 2200  . mirtazapine (REMERON SOL-TAB) 15 MG disintegrating tablet Place 15 mg into feeding tube at bedtime.    01/02/2014 at 2200  . Nutritional Supplements (FEEDING SUPPLEMENT, JEVITY 1.2 CAL,) LIQD Place  1,000 mLs into feeding tube continuous.   01/02/2014 at 2200  . oxyCODONE-acetaminophen (PERCOCET/ROXICET) 5-325 MG per tablet Place 1 tablet into feeding tube every 6 (six) hours as needed for moderate pain or severe pain.    unknown at unknown  . pantoprazole (PROTONIX) 40 MG injection Inject 40 mg into the vein every 12 (twelve) hours. 1 each  01/02/2014 at 2200  . potassium chloride (KLOR-CON) 20 MEQ packet Place 20 mEq into feeding tube daily.    01/02/2014 at 1000  . risperiDONE (RISPERDAL) 1 MG tablet Place 1 tablet (1 mg total) into feeding tube 2 (two) times daily. Via feeding tube 60 tablet 1 01/02/2014 at 2200  . Valproic Acid (DEPAKENE) 250 MG/5ML SYRP syrup Place 750 mg into feeding tube every 8 (eight) hours.    01/02/2014 at 2200  . Water For Irrigation, Sterile (FREE WATER) SOLN Place 200 mLs into feeding tube 4 (four) times daily. 200 mL 1 unknown at unknown  . zolpidem (AMBIEN) 5 MG tablet Take 5 mg by mouth at bedtime as needed for sleep.   01/02/2014 at 2200   Scheduled: . aztreonam  1 g Intravenous 3 times per day  . Chlorhexidine Gluconate Cloth  6 each Topical Q0600  . fluconazole (DIFLUCAN) IV  100 mg Intravenous Q24H  . hydrocortisone sod succinate (SOLU-CORTEF) inj  50 mg Intravenous Q6H  . metronidazole  500 mg Intravenous Q8H  . mupirocin ointment  1 application Nasal BID  . valproate sodium  750 mg Intravenous 3 times per day  . vancomycin  750 mg Intravenous Q24H   Continuous: . sodium chloride 150 mL/hr at 09/30/14 0600     ROS:                                                                                                                                       History obtained from unobtainable from patient due to mental status   Blood pressure 110/63, pulse 103, temperature 98.8 F (37.1 C), temperature source Axillary, resp. rate 20, height  (1.651 m), weight 60.1 kg (132 lb 7.9 oz), SpO2 98 %.   Neurologic Examination:  HEENT-  Normocephalic, no lesions, without obvious abnormality.  Normal external eye and conjunctiva.  Normal TM's bilaterally.  Normal auditory canals and external ears. Normal external nose, mucus membranes and septum.  Normal pharynx. Cardiovascular- S1, S2 normal, pulses palpable throughout   Lungs- chest clear, no wheezing, rales, normal symmetric air entry Abdomen- normal findings: bowel sounds normal Extremities- edema of LE Lymph-no adenopathy palpable Musculoskeletal-no joint tenderness, deformity or swelling Skin-warm and dry, no hyperpigmentation, vitiligo, or suspicious lesions  Neurological Examination Mental Status: Alert, stares in one direction shows pain by grimacing when touched. Follows no verbal commands except for closing her eyes to command.  Cranial Nerves: II: Discs flat bilaterally;blinks to threat bilaterally, pupils equal, round, reactive to light and accommodation [name],IV, VI: ptosis not present, stares forward but when light shined in eye she will look to left and right.  V,VII: smile symmetric, facial light touch sensation normal bilaterally VIII: opens eyes to name called IX,X: mouth held tightly closed and unable to visualize XI: unable to assess XII: unable to visualize Motor: Bilateral arms held in flexion contraction at wrist, elbow and arms held against body. Bilateral legs held in extension.  Sensory: winces in pain to light touch throughout Deep Tendon Reflexes: no reflexes were elicited throughout  Plantars: Mute bilaterally Cerebellar: Unable to assess Gait: unable to assess      Lab Results: Basic Metabolic Panel:  Recent Labs Lab 09/30/14 0010 09/30/14 0830  NA 133* 137  K 3.7 3.3*  CL 92* 103  CO2 27 23  GLUCOSE 120* 65*  BUN 14 9  CREATININE 1.08* 0.73  CALCIUM 8.6* 7.3*    Liver Function Tests:  Recent Labs Lab 09/30/14 0010 09/30/14 0830  AST 65* 55*  ALT  28 23  ALKPHOS 66 49  BILITOT 1.4* 1.1  PROT 5.0* 4.4*  ALBUMIN 2.4* 2.0*   No results for input(s): LIPASE, AMYLASE in the last 168 hours.  Recent Labs Lab 09/30/14 0439  AMMONIA 29    CBC:  Recent Labs Lab 09/30/14 0145 09/30/14 0830  WBC 7.1 7.3  NEUTROABS 4.9  --   HGB 8.9* 12.9  HCT 26.1* 37.7  MCV 96.0 94.5  PLT 32* 41*    Cardiac Enzymes: No results for input(s): CKTOTAL, CKMB, CKMBINDEX, TROPONINI in the last 168 hours.  Lipid Panel: No results for input(s): CHOL, TRIG, HDL, CHOLHDL, VLDL, LDLCALC in the last 168 hours.  CBG: No results for input(s): GLUCAP in the last 168 hours.  Microbiology: Results for orders placed or performed during the hospital encounter of 09/29/14  Clostridium Difficile by PCR     Status: None   Collection Time: 09/30/14 12:37 AM  Result Value Ref Range Status   C difficile by pcr NEGATIVE NEGATIVE Final  MRSA PCR Screening     Status: Abnormal   Collection Time: 09/30/14  5:38 AM  Result Value Ref Range Status   MRSA by PCR POSITIVE (A) NEGATIVE Final    Comment:        The GeneXpert MRSA Assay (FDA approved for NASAL specimens only), is one component of a comprehensive MRSA colonization surveillance program. It is not intended to diagnose MRSA infection nor to guide or monitor treatment for MRSA infections. RESULT CALLED TO, READ BACK BY AND VERIFIED WITH: E SMITH,RN AT 0845 09/30/14 BY K BARR     Coagulation Studies:  Recent Labs  09/30/14 0830  LABPROT 16.5*  INR 1.32    Imaging: Ct Head Wo Contrast  09/30/2014  CLINICAL DATA:  Acute encephalopathy.  EXAM: CT HEAD WITHOUT CONTRAST  TECHNIQUE: Contiguous axial images were obtained from the base of the skull through the vertex without intravenous contrast.  COMPARISON:  12/03/2013 MRI  FINDINGS: There is no intracranial hemorrhage, mass or evidence of acute infarction. There is no extra-axial fluid collection. There is moderately severe generalized atrophy and  moderate periventricular hypodensity which likely represents chronic small vessel disease. No superimposed acute findings are evident. No bony abnormalities are evident. The visible portions of the paranasal sinuses clear.  IMPRESSION: Moderately severe generalized atrophy and chronic small vessel disease. No acute findings.   Electronically Signed   By: Ellery Plunk M.D.   On: 09/30/2014 04:45   Dg Chest Port 1 View  09/29/2014   CLINICAL DATA:  Altered mental status, some short of breath  EXAM: PORTABLE CHEST - 1 VIEW  COMPARISON:  Radiograph 01/03/2014  FINDINGS: Normal mediastinum and cardiac silhouette. Normal pulmonary vasculature. No evidence of effusion, infiltrate, or pneumothorax. No acute bony abnormality.  IMPRESSION: No acute cardiopulmonary process.   Electronically Signed   By: Genevive Bi M.D.   On: 09/29/2014 23:49     Felicie Morn PA-C [facility] [phone]  09/30/2014, 1:13 PM   Patient seen and examined.  Clinical course and management discussed.  Necessary edits performed.  I agree with the above.  Assessment and plan of care developed and discussed below.      Assessment/Plan: 54 year old female with a history of a neurological decline, etiology unclear.  Has had multiple admissions due to acute decline in mental status beyond baseline that were secondary to infection on most occasions.  Patient again presents with acute decline in mental status.  Does not appear to have a UTI or pneumonia at this time but does have a significant degree of breakdown.  Concern is that this may have led to her decline.  Head CT only significant for atrophy.  EEG only significant for slowing.  Depakote level therapeutic.  Recommendations: 1.  Agree with continued addressing of breakdown.   2.  Will follow up on cultures 3.  Continue current AED therapy   Thana Farr, MD [facility] [phone]  09/30/2014  6:11 PM

## 2014-09-30 NOTE — Care Management Note (Signed)
Case Management Note  Patient Details  Name: Joan [name] MRN: [id number] Date of Birth: 12/25/1960  Subjective/Objective:       Adm w confusion, sepsis             Action/Plan: lives w son,  pcp dr Kerry Dorydasanayaka   Expected Discharge Date:                  Expected Discharge Plan:  Home w Home Health Services  In-House Referral:     Discharge planning Services     Post Acute Care Choice:    Choice offered to:     DME Arranged:    DME Agency:     HH Arranged:    HH Agency:     Status of Service:     Medicare Important Message Given:    Date Medicare IM Given:    Medicare IM give by:    Date Additional Medicare IM Given:    Additional Medicare Important Message give by:     If discussed at Long Length of Stay Meetings, dates discussed:    Additional Comments:  [name], Neely Cecena T, RN 09/30/2014, 8:43 AM

## 2014-09-30 NOTE — Progress Notes (Signed)
Patient admitted after midnight. Current and previous chart reviewed.  Also, [facility]. Patient examined. Currently getting EEG.  Had been followed by hospice in the past and at one point was DO NOT RESUSCITATE. Previous records mention CJD. [facility] mention autoimmune encephalitis. I don't see that patient has a PEG tube. No family members available. Patient remains unresponsive. Blood pressure normal. Will get a peripheral smear, anemia panel continue empiric antibiotics, trending lactates, follow-up EEG, follow-up stool studies.  Also, home med rec has not been completed. Have asked pharmacy to complete. No family at bedside. Prognosis appears poor. Will discuss with family further.  CODE STATUS and goals of care will need to be addressed. Appreciate critical care.  Crista Curborinna Lavina Resor, MD [facility] [email or web address] password [email or web address]

## 2014-09-30 NOTE — H&P (Signed)
[facility] History and Physical  Joan [name] ZOX:[id number] DOB: 30-Mar-1961 DOA: 09/29/2014  Referring physician: Dr.Yelverton. ER physician. PCP: [name], MD  Specialists: Patient follows up with neurologist at [facility].  History of bleeding from patient's son as patient is confused.  Chief Complaint: Patient is confused and nonverbal.  HPI: Joan [name] is a 54 y.o. 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 Patient's son states that last month patient had urine infection which was treated and again patient started having urinary infection 4 days ago which patient was started on antibiotics again. Over the last 4 days patient has become less communicative and confused. Patient was brought to the ER and initially was found to be hypotensive and patient was resuscitated with fluids follow with blood pressure improved. Lactic acid was elevated but has further worsened and another 2 L normal saline bolus at this time has been ordered by me. Patient was started on empiric antibiotics after blood cultures were obtained by the operation. In the ER patient had loose stools and blood work show anemia and thrombocytopenia. Stool for blood have been positive. As per the son patient did not have any nausea vomiting chest pain or shortness of breath. On exam patient has tremors and the rigid upper and lower extremities which patient's son states has been like this for last 2 years and has been followed up with neurologist at [facility] and patient presently is on prednisone. Patient also has skin changes in the groin area. As per patient's son patient has been largely bed bound for last 2 years.  Review of Systems: As presented in the history of presenting illness, rest negative.  Past Medical History  Diagnosis Date  .  Peripheral neuropathy   . Fibromyalgia   . Hypertension   . Anxiety   . Catatonia   . Seizures    Past Surgical History  Procedure Laterality Date  . Gastric bypass    . Abdominal hysterectomy    . Jejunostomy N/A 06/25/2013    Procedure:  OPEN JEJUNOSTOMY FEEDING TUBE ;  Surgeon: Cherylynn Ridges, MD;  Location: [facility] OR;  Service: General;  Laterality: N/A;  . Laparotomy N/A 07/02/2013    Procedure: EXPLORATORY LAPAROTOMY with revision of jejunostomy feeding tube.;  Surgeon: Cherylynn Ridges, MD;  Location: [facility] OR;  Service: General;  Laterality: N/A;  . Bowel resection N/A 07/02/2013    Procedure: SMALL BOWEL RESECTION;  Surgeon: Cherylynn Ridges, MD;  Location: [facility] OR;  Service: General;  Laterality: N/A;   Social History:  reports that she has never smoked. She has never used smokeless tobacco. She reports that she does not drink alcohol. Her drug history is not on file. Where does patient live home. Can patient participate in ADLs? No.  Allergies  Allergen Reactions  . Penicillins Other (See Comments), Rash and Shortness Of Breath    REACTION: Makes her body hurt.  . Prednisone Palpitations  . Citalopram Nausea And Vomiting  . Clindamycin Rash and Other (See Comments)    GI upset  . Duloxetine Hcl Other (See Comments)    GI upset   . Escitalopram Nausea And Vomiting  . Hydromorphone Other (See Comments)    Generalized severe pain  . Morphine Other (See Comments)    Generalized severe pain  . Paroxetine Other (See Comments)    GI upset  . Sertraline  Other (See Comments)    GI upset  . Sulfamethoxazole-Trimethoprim Nausea And Vomiting  . Tape Dermatitis  . Clarithromycin Rash  . Doxycycline Rash and Other (See Comments)    GI upset  . Metoclopramide Anxiety  . Morphine And Related Other (See Comments)    REACTION: Causes pain  . Paroxetine Hcl Other (See Comments)    REACTION:  unknown    Family History:  Family History  Problem Relation Age of Onset  . Hypertension Mother    . Diabetes Father       Prior to Admission medications   Medication Sig Start Date End Date Taking? Authorizing Provider  acetaminophen (TYLENOL) 160 MG/5ML solution Take 480 mg by mouth every 6 (six) hours as needed for mild pain.    Historical Provider, MD  clonazePAM (KLONOPIN) 0.5 MG disintegrating tablet Place 0.5 mg into feeding tube 3 (three) times daily.  12/04/13   Dorothea Ogle, MD  ENSURE (ENSURE) Take 237 mLs by mouth 3 (three) times daily between meals.    Historical Provider, MD  hydrALAZINE (APRESOLINE) 10 MG tablet Place 10 mg into feeding tube 3 (three) times daily.  12/04/13   Dorothea Ogle, MD  lacosamide (VIMPAT) 200 MG TABS tablet Place 200 mg into feeding tube 2 (two) times daily.  08/27/13   Alison Murray, MD  levETIRAcetam (KEPPRA) 100 MG/ML solution Place 1,500 mg into feeding tube 2 (two) times daily.  12/04/13   Dorothea Ogle, MD  LORazepam (ATIVAN) 2 MG tablet Place 2 mg into feeding tube every 6 (six) hours as needed for anxiety.     Historical Provider, MD  metoprolol tartrate (LOPRESSOR) 25 mg/10 mL SUSP Place 50 mg into feeding tube 2 (two) times daily.  12/04/13   Dorothea Ogle, MD  mirtazapine (REMERON SOL-TAB) 15 MG disintegrating tablet Place 15 mg into feeding tube at bedtime.  07/30/13   Russella Dar, NP  Nutritional Supplements (FEEDING SUPPLEMENT, JEVITY 1.2 CAL,) LIQD Place 1,000 mLs into feeding tube continuous.    Historical Provider, MD  oxyCODONE-acetaminophen (PERCOCET/ROXICET) 5-325 MG per tablet Place 1 tablet into feeding tube every 6 (six) hours as needed for moderate pain or severe pain.     Historical Provider, MD  pantoprazole (PROTONIX) 40 MG injection Inject 40 mg into the vein every 12 (twelve) hours. 08/27/13   Alison Murray, MD  potassium chloride (KLOR-CON) 20 MEQ packet Place 20 mEq into feeding tube daily.     Historical Provider, MD  risperiDONE (RISPERDAL) 1 MG tablet Place 1 tablet (1 mg total) into feeding tube 2 (two) times daily. Via  feeding tube 12/04/13   Dorothea Ogle, MD  Valproic Acid (DEPAKENE) 250 MG/5ML SYRP syrup Place 750 mg into feeding tube every 8 (eight) hours.  08/27/13   Alison Murray, MD  Water For Irrigation, Sterile (FREE WATER) SOLN Place 200 mLs into feeding tube 4 (four) times daily. 08/27/13   Alison Murray, MD  zolpidem (AMBIEN) 5 MG tablet Take 5 mg by mouth at bedtime as needed for sleep.    Historical Provider, MD    Physical Exam: Filed Vitals:   09/30/14 0215 09/30/14 0228 09/30/14 0300 09/30/14 0330  BP:   122/60 125/91  Pulse: 94 84  120  Temp:      TempSrc:      Resp: 15 14 12 17   SpO2: 99% 94%  96%     General:  Moderately built and nourished.  Eyes: Anicteric  no pallor.  ENT: No discharge from ears eyes nose and mouth.  Neck: No mass felt.  Cardiovascular: S1 and S2 heard.  Respiratory: No rhonchi or crepitations.  Abdomen: Soft nontender bowel sounds present.  Skin: Skin rash on the groin area.  Musculoskeletal: No edema.  Psychiatric: Patient appears confused and noncommunicative.  Neurologic: Patient is alert but confused noncommunicative and has a rigid muscles of the upper and lower extremity which patient's son states is chronic.  Labs on Admission:  Basic Metabolic Panel:  Recent Labs Lab 09/30/14 0010  NA 133*  K 3.7  CL 92*  CO2 27  GLUCOSE 120*  BUN 14  CREATININE 1.08*  CALCIUM 8.6*   Liver Function Tests:  Recent Labs Lab 09/30/14 0010  AST 65*  ALT 28  ALKPHOS 66  BILITOT 1.4*  PROT 5.0*  ALBUMIN 2.4*   No results for input(s): LIPASE, AMYLASE in the last 168 hours. No results for input(s): AMMONIA in the last 168 hours. CBC:  Recent Labs Lab 09/30/14 0145  WBC 7.1  NEUTROABS 4.9  HGB 8.9*  HCT 26.1*  MCV 96.0  PLT 32*   Cardiac Enzymes: No results for input(s): CKTOTAL, CKMB, CKMBINDEX, TROPONINI in the last 168 hours.  BNP (last 3 results) No results for input(s): BNP in the last 8760 hours.  ProBNP (last 3  results) No results for input(s): PROBNP in the last 8760 hours.  CBG: No results for input(s): GLUCAP in the last 168 hours.  Radiological Exams on Admission: Dg Chest Port 1 View  09/29/2014   CLINICAL DATA:  Altered mental status, some short of breath  EXAM: PORTABLE CHEST - 1 VIEW  COMPARISON:  Radiograph 01/03/2014  FINDINGS: Normal mediastinum and cardiac silhouette. Normal pulmonary vasculature. No evidence of effusion, infiltrate, or pneumothorax. No acute bony abnormality.  IMPRESSION: No acute cardiopulmonary process.   Electronically Signed   By: Genevive BiStewart  Edmunds M.D.   On: 09/29/2014 23:49    EKG: Independently reviewed. Sinus tachycardia.  Assessment/Plan Active Problems:   Sepsis   Acute encephalopathy   AKI (acute kidney injury)   Diarrhea   Seizures   1. Sepsis - source not clear. At this time. Has been placed on vancomycin and Azactam and Flagyl. Follow blood cultures urine cultures and stool studies. Continue with aggressive hydration check pro calcitonin levels and follow lactic acid levels. Patient has been placed on IV steroids as patient used to be on prednisone as per the son. 2. Acute encephalopathy - probably secondary to #1. If mental status does not improve may need MRI brain. Check Depakote levels and ammonia levels. Check EEG. 3. Anemia and thrombocytopenia - stool for blood has been positive. Closely follow CBC. Check LDH to rule out any hemolytic process. Thrombocytopenia can also be from infectious reasons. 4. GI bleed - patient stool for blood is A+. Closely follow CBC and if there is any further decline in hemoglobin less than 7 may need transfusion. 5. History of seizures - since patient's swallow is not reliable I have placed patient on IV antiepileptic medications for now. 6. History of hypertension presently hypotensive so holding all antihypertensives. 7. History of autoimmune neurological disorder as per the son and [facility] -  patient as per the son was on prednisone and I have placed patient on IV Solu-Medrol for now as patient cannot take reliably orally.  I have reviewed patient's charts and also have discussed with on-call pulmonary critical care.   DVT Prophylaxis SCDs.  Code Status: Full code.  Family Communication: I have discussed both patient's sons.  Disposition Plan: Admit to inpatient.    KAKRAKANDY,ARSHAD N. [facility] Pager [phone].  If 7PM-7AM, please contact night-coverage [email or web address] Password [facility] 09/30/2014, 3:46 AM

## 2014-09-30 NOTE — Progress Notes (Signed)
ANTIBIOTIC CONSULT NOTE - INITIAL  Pharmacy Consult for Vancomycin and Aztreonam Indication: rule out sepsis  Allergies  Allergen Reactions  . Penicillins Other (See Comments), Rash and Shortness Of Breath    REACTION: Makes her body hurt.  . Prednisone Palpitations  . Citalopram Nausea And Vomiting  . Clindamycin Rash and Other (See Comments)    GI upset  . Duloxetine Hcl Other (See Comments)    GI upset   . Escitalopram Nausea And Vomiting  . Hydromorphone Other (See Comments)    Generalized severe pain  . Morphine Other (See Comments)    Generalized severe pain  . Paroxetine Other (See Comments)    GI upset  . Sertraline Other (See Comments)    GI upset  . Sulfamethoxazole-Trimethoprim Nausea And Vomiting  . Tape Dermatitis  . Clarithromycin Rash  . Doxycycline Rash and Other (See Comments)    GI upset  . Metoclopramide Anxiety  . Morphine And Related Other (See Comments)    REACTION: Causes pain  . Paroxetine Hcl Other (See Comments)    REACTION:  unknown    Patient Measurements:   Adjusted Body Weight: 75 kg  Vital Signs: Temp: 98.5 F (36.9 C) (05/02 2333) Temp Source: Rectal (05/02 2333) BP: 118/74 mmHg (05/03 0430) Pulse Rate: 128 (05/03 0430) Intake/Output from previous day: 05/02 0701 - 05/03 0700 In: 4900 [I.V.:2500; IV Piggyback:2400] Out: 320 [Urine:320] Intake/Output from this shift: Total I/O In: 4900 [I.V.:2500; IV Piggyback:2400] Out: 320 [Urine:320]  Labs:  Recent Labs  09/30/14 0010 09/30/14 0145  WBC  --  7.1  HGB  --  8.9*  PLT  --  32*  CREATININE 1.08*  --    CrCl cannot be calculated (Unknown ideal weight.). No results for input(s): VANCOTROUGH, VANCOPEAK, VANCORANDOM, GENTTROUGH, GENTPEAK, GENTRANDOM, TOBRATROUGH, TOBRAPEAK, TOBRARND, AMIKACINPEAK, AMIKACINTROU, AMIKACIN in the last 72 hours.   Microbiology: No results found for this or any previous visit (from the past 720 hour(s)).  Medical History: Past Medical  History  Diagnosis Date  . Peripheral neuropathy   . Fibromyalgia   . Hypertension   . Anxiety   . Catatonia   . Seizures     Medications:  Protonix  Risperdal  Klonopin  Hydralazine  Vimpat  Keppra  Ativan  Lopressor  Remeron  KCl  Depakene  Ambien  Assessment: 54 y.o. female with AMS, possible urosepsis, for empiric antibiotics  Vancomycin 1 g IV given in ED at  midnight  Goal of Therapy:  Vancomycin trough level 15-20 mcg/ml  Plan:  Vancomycin 750 mg IV q24h Aztreonam 1 g IV q8h  Joan [name], Joan [name] 09/30/2014,4:59 AM

## 2014-09-30 NOTE — Consult Note (Signed)
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 seizures  and has been a DNR in the past. She has also been a [facility] patient in the past. She presents with 4 days of increasing lethargy, rigors and FTT. Treated with abx as outpatient she presents to ED 5/2 with above symptoms plus dehydration, thrombocytopenia (plts 32) , lactic acid >4. She is admitted to ICU, non verbal, sbp133 and ST 103. She has severe contractures of all her extremities. She has a sacral decubitus stage 4. PCCM is consulted for sepsis. Note she is on chronic steroids for "autoimmune neurological disorder" and has been placed on IV solumedrol. She is not a candidate for any further peg's due to previous gastric bypass. She needs further EOL discussion.  PAST MEDICAL HISTORY :   has a past medical history of Peripheral neuropathy; Fibromyalgia; Hypertension; Anxiety; Catatonia; and Seizures.  has past surgical history that includes Gastric bypass; Abdominal hysterectomy; Jejunostomy (N/A, 06/25/2013); laparotomy (N/A, 07/02/2013); and Bowel resection (N/A, 07/02/2013). Prior to Admission medications   Medication Sig Start Date End Date Taking? Authorizing Provider  acetaminophen (TYLENOL) 160 MG/5ML solution Take 480 mg by mouth every 6 (six) hours as needed for mild pain.    Historical Provider, MD  clonazePAM (KLONOPIN) 0.5 MG disintegrating tablet Place 0.5 mg into feeding tube 3 (three) times daily.  12/04/13   Dorothea OgleIskra M Myers, MD  ENSURE (ENSURE) Take 237 mLs by mouth 3 (three) times daily between meals.    Historical Provider, MD  hydrALAZINE (APRESOLINE) 10 MG  tablet Place 10 mg into feeding tube 3 (three) times daily.  12/04/13   Dorothea OgleIskra M Myers, MD  lacosamide (VIMPAT) 200 MG TABS tablet Place 200 mg into feeding tube 2 (two) times daily.  08/27/13   Alison MurrayAlma M Devine, MD  levETIRAcetam (KEPPRA) 100 MG/ML solution Place 1,500 mg into feeding tube 2 (two) times daily.  12/04/13   Dorothea OgleIskra M Myers, MD  LORazepam (ATIVAN) 2 MG tablet Place 2 mg into feeding tube every 6 (six) hours as needed for anxiety.     Historical Provider, MD  metoprolol tartrate (LOPRESSOR) 25 mg/10 mL SUSP Place 50 mg into feeding tube 2 (two) times daily.  12/04/13   Dorothea OgleIskra M Myers, MD  mirtazapine (REMERON SOL-TAB) 15 MG disintegrating tablet Place 15 mg into feeding tube at bedtime.  07/30/13   Russella DarAllison L Ellis, NP  Nutritional Supplements (FEEDING SUPPLEMENT, JEVITY 1.2 CAL,) LIQD Place 1,000 mLs into feeding tube continuous.    Historical Provider, MD  oxyCODONE-acetaminophen (PERCOCET/ROXICET) 5-325 MG per tablet Place 1 tablet into feeding tube every 6 (six) hours as needed for moderate pain or severe pain.     Historical Provider, MD  pantoprazole (PROTONIX) 40 MG injection Inject 40 mg into the vein every 12 (twelve) hours. 08/27/13   Alison MurrayAlma M Devine, MD  potassium chloride (KLOR-CON) 20 MEQ packet Place 20 mEq into feeding tube daily.     Historical Provider, MD  risperiDONE (RISPERDAL) 1 MG tablet Place 1 tablet (1 mg total) into feeding tube 2 (two) times daily. Via feeding tube 12/04/13  Dorothea Ogle, MD  Valproic Acid (DEPAKENE) 250 MG/5ML SYRP syrup Place 750 mg into feeding tube every 8 (eight) hours.  08/27/13   Alison Murray, MD  Water For Irrigation, Sterile (FREE WATER) SOLN Place 200 mLs into feeding tube 4 (four) times daily. 08/27/13   Alison Murray, MD  zolpidem (AMBIEN) 5 MG tablet Take 5 mg by mouth at bedtime as needed for sleep.    Historical Provider, MD   Allergies  Allergen Reactions  . Penicillins Other (See Comments), Rash and Shortness Of Breath    REACTION: Makes her  body hurt.  . Prednisone Palpitations  . Citalopram Nausea And Vomiting  . Clindamycin Rash and Other (See Comments)    GI upset  . Duloxetine Hcl Other (See Comments)    GI upset   . Escitalopram Nausea And Vomiting  . Hydromorphone Other (See Comments)    Generalized severe pain  . Morphine Other (See Comments)    Generalized severe pain  . Paroxetine Other (See Comments)    GI upset  . Sertraline Other (See Comments)    GI upset  . Sulfamethoxazole-Trimethoprim Nausea And Vomiting  . Tape Dermatitis  . Clarithromycin Rash  . Doxycycline Rash and Other (See Comments)    GI upset  . Metoclopramide Anxiety  . Morphine And Related Other (See Comments)    REACTION: Causes pain  . Paroxetine Hcl Other (See Comments)    REACTION:  unknown    FAMILY HISTORY:  has no family status information on file.  SOCIAL HISTORY:  reports that she has never smoked. She has never used smokeless tobacco. She reports that she does not drink alcohol.  REVIEW OF SYSTEMS:  na  SUBJECTIVE:   VITAL SIGNS: Temp:  [98.3 F (36.8 C)-98.5 F (36.9 C)] 98.3 F (36.8 C) (05/03 0530) Pulse Rate:  [53-132] 82 (05/03 0652) Resp:  [9-24] 15 (05/03 0652) BP: (79-147)/(27-91) 124/73 mmHg (05/03 0652) SpO2:  [80 %-100 %] 94 % (05/03 0652) Weight:  [132 lb 7.9 oz (60.1 kg)] 132 lb 7.9 oz (60.1 kg) (05/03 0530) HEMODYNAMICS:   VENTILATOR SETTINGS:   INTAKE / OUTPUT:  Intake/Output Summary (Last 24 hours) at 09/30/14 0750 Last data filed at 09/30/14 0600  Gross per 24 hour  Intake   5150 ml  Output    365 ml  Net   4785 ml    PHYSICAL EXAMINATION: General:  Frail contacted [facility] Neuro: Opens eyes. No follow commands  HEENT: Neck contracted, lips cracked, unable to open mouth Cardiovascular: HSR RRR Lungs: Decreased air movement Abdomen:  Old scars + bs Musculoskeletal:  contracted Skin:  Warm, old sacral decubitus  LABS:  CBC  Recent Labs Lab 09/30/14 0145  WBC 7.1  HGB 8.9*  HCT  26.1*  PLT 32*   Coag's No results for input(s): APTT, INR in the last 168 hours. BMET  Recent Labs Lab 09/30/14 0010  NA 133*  K 3.7  CL 92*  CO2 27  BUN 14  CREATININE 1.08*  GLUCOSE 120*   Electrolytes  Recent Labs Lab 09/30/14 0010  CALCIUM 8.6*   Sepsis Markers  Recent Labs Lab 09/30/14 0017 09/30/14 0214 09/30/14 0439  LATICACIDVEN 2.71* 4.15*  --   PROCALCITON  --   --  <0.10   ABG No results for input(s): PHART, PCO2ART, PO2ART in the last 168 hours. Liver Enzymes  Recent Labs Lab 09/30/14 0010  AST 65*  ALT 28  ALKPHOS 66  BILITOT 1.4*  ALBUMIN 2.4*  Cardiac Enzymes No results for input(s): TROPONINI, PROBNP in the last 168 hours. Glucose No results for input(s): GLUCAP in the last 168 hours.  Imaging Dg Chest Port 1 View  09/29/2014   CLINICAL DATA:  Altered mental status, some short of breath  EXAM: PORTABLE CHEST - 1 VIEW  COMPARISON:  Radiograph 01/03/2014  FINDINGS: Normal mediastinum and cardiac silhouette. Normal pulmonary vasculature. No evidence of effusion, infiltrate, or pneumothorax. No acute bony abnormality.  IMPRESSION: No acute cardiopulmonary process.   Electronically Signed   By: Genevive Bi M.D.   On: 09/29/2014 23:49     ASSESSMENT / PLAN:  PULMONARY OETT* A: No acute issue P:   O2 as needed Pursue DNR status  CARDIOVASCULAR CVL A:  Tachycardia P:  Presumed from dehydration therefore continue IVF  RENAL Lab Results  Component Value Date   CREATININE 1.08* 09/30/2014   CREATININE 0.51 12/24/2013   CREATININE 0.34* 12/03/2013   CREATININE 0.94 06/14/2013    A:   Mild renal insuff P:   Hydration  GASTROINTESTINAL A:   Hx of gastric bypass Unable to have PEG P:   May nee NGT for feeds  HEMATOLOGIC A:   Thrombocytopenia Anemia P:  Follow  Labs Avoid anticoagulants   INFECTIOUS A:   Presumed UTI possible C diff Possible wound infection P:   BCx2 5/3>> UC 5/3>> Sputum C diff  5/3>> Abx:  5/3 azactam>> 5/3 flagyl>> 5/3 vancomycin>>  ENDOCRINE A:   Chronic steroid use  P:   Change to solucortef  NEUROLOGIC A:   Hx of bipolar Hx of catatonia Non verbal from strokes ? Underlying seizures  P:   RASS goal: 0 EEG CT head negative 5/3 ? Neuro consult  Global: PCCM available as needed. Agree with aggressive fluid hydration, serial lactic acid, change to solucortef, pursue EOL discussions.   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 seizures  and has been a DNR in the past. She has also been a [facility] patient in the past. She presents with 4 days of increasing lethargy, rigors and FTT. Treated with abx as outpatient she presents to ED 5/2 with above symptoms plus dehydration, thrombocytopenia (plts 32) , lactic acid >4. She is admitted to ICU, non verbal, sbp133 and ST 103. She has severe contractures of all her extremities. She has a sacral decubitus stage 4. PCCM is consulted for sepsis. Note she is on chronic steroids for "autoimmune neurological disorder" and has been placed on IV solumedrol. She is not a candidate for any further peg's due to previous gastric bypass. She needs further EOL discussion.  Brett Canales Minor ACNP [facility] Pager [phone] till 3 pm If no answer page [phone] 09/30/2014, 8:13 AM    Attending:  I have seen and examined the patient with nurse practitioner/resident and agree with the note above.   Chart reviewed extensively including [facility] notes from 03/2014.  She presented with acute encephalopathy and possible sepsis of uncertain etiology.  She has been given IVF and we were consulted because her lactic acid was rising.    On my exam she was able to open her eyes to voice, open her mouth on command. Lungs clear , belly soft, scar  from old feeding tubes noted.  Labs reviewed, lactic acid of 4.15  drawn at 0214 AM, notably PCCM consulted at 0720.  Anion gap 14 WBC normal CXR personally reviewed> no infiltrate noted  Lactic acidosis> I'm not clear why this is the case. She is tachycardic but she does not meet SIRS criteria otherwise.  Given her extensive history of this neurologic condition and history of frequent infections then it may be that she has an occult infection, but so far no source is seen.  DDx includes sepsis (seems less likely given normal blood pressure), bowel ischemia vs subclinical seizures.   -Agree with STAT EEG, I will ask neurology to read STAT -STAT repeat lactic acid -continue volume resuscitation -if EEG negative and lactic acid not clearing then order CT abdomen  Acute encephalopathy> due to acute infection? No clear source, polypharmacy? TTP/HUS? -hold home sedating meds that aren't for seizure  Thrombocytopenia -will send peripheral smear, check haptoglobin  Relative adrenal insufficiency -check cortisol -we will change stress dose steroids to hydrocortisone   PCCM available if lactic acid not clearing and encephalopathy not improving, otherwise [facility] to remain primary service  Heber Waterford, MD [place] [facility] Pager: [phone] Cell: [phone] If no response, call [phone]

## 2014-09-30 NOTE — ED Notes (Signed)
Paged out Lvl 1 Code Sepsis to [facility] per Lewie LoronEmilie, RN

## 2014-09-30 NOTE — Progress Notes (Signed)
Awaiting family arrival to complete PTA medication history given patients neurological status  Leota SauersLisa Paula Busenbark Pharm.D. CPP, BCPS Clinical Pharmacist [phone] 09/30/2014 10:03 AM

## 2014-09-30 NOTE — Evaluation (Signed)
Clinical/Bedside Swallow Evaluation Patient Details  Name: Joan [name] MRN: [id number] Date of Birth: April 06, 1961  Today's Date: 09/30/2014 Time: SLP Start Time (ACUTE ONLY): 1150 SLP Stop Time (ACUTE ONLY): 1207 SLP Time Calculation (min) (ACUTE ONLY): 17 min  Past Medical History:  Past Medical History  Diagnosis Date  . Peripheral neuropathy   . Fibromyalgia   . Hypertension   . Anxiety   . Catatonia   . Seizures    Past Surgical History:  Past Surgical History  Procedure Laterality Date  . Gastric bypass    . Abdominal hysterectomy    . Jejunostomy N/A 06/25/2013    Procedure:  OPEN JEJUNOSTOMY FEEDING TUBE ;  Surgeon: Cherylynn Ridges, MD;  Location: [facility] OR;  Service: General;  Laterality: N/A;  . Laparotomy N/A 07/02/2013    Procedure: EXPLORATORY LAPAROTOMY with revision of jejunostomy feeding tube.;  Surgeon: Cherylynn Ridges, MD;  Location: [facility] OR;  Service: General;  Laterality: N/A;  . Bowel resection N/A 07/02/2013    Procedure: SMALL BOWEL RESECTION;  Surgeon: Cherylynn Ridges, MD;  Location: [facility] OR;  Service: General;  Laterality: N/A;   HPI:  Joan [name] is a 54 y.o. 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  MD reports pt has sepsis of unclear etiology. As per patient's son patient has been largely bed bound for last 2 years. Pt had OP MBS on 11/29/13 showing moderate pharyngeal dysphagia with a delay in swallow, a severe oral dysphagia. Pt had multiple NG tube placed Recommended liquid diet, pt on hospice at the time.    Assessment / Plan / Recommendation Clinical Impression  Pt evaluated for ability to begin POs in setting of AMS, limited responsiveness and history of dysphagia. Pt awakened easly and responded automatically, opening lips and accepting offered straw. Pt consumed 8 oz of juice with no evidence of aspiration,  though swallow is likely delayed. Pt also accepted teaspoons of sherbert though labial closure on spoon incomplete requiring SLP assist to place bolus in her mouth. Pt able to consume puree solids and thin liquids with min to moderate risk of aspiration depending on mentation, bed positioning. Will initiate diet and f/u for tolerance.     Aspiration Risk  Moderate    Diet Recommendation Dysphagia 1 (Puree);Thin   Medication Administration: Crushed with puree    Other  Recommendations Oral Care Recommendations: Oral care BID   Follow Up Recommendations       Frequency and Duration    2 weeks   Pertinent Vitals/Pain NA    SLP Swallow Goals     Swallow Study Prior Functional Status       General Other Pertinent Information: Joan [name] is a 54 y.o. 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  MD reports pt has sepsis of unclear etiology. As per patient's son patient has been largely bed bound for last 2 years. Pt had OP MBS on 11/29/13 showing moderate pharyngeal dysphagia with a delay in swallow, a severe oral dysphagia. Pt had multiple NG tube placed Recommended liquid diet, pt on hospice at the time.  Type of Study: Bedside swallow evaluation Previous Swallow Assessment: see HPI Diet Prior to this Study: NPO Temperature Spikes Noted: No Respiratory Status: Room air History of  Recent Intubation: No Behavior/Cognition: Alert;Requires cueing Oral Cavity - Dentition: Missing dentition Self-Feeding Abilities: Total assist Patient Positioning: Upright in bed Baseline Vocal Quality: Not observed Volitional Cough: Cognitively unable to elicit Volitional Swallow: Unable to elicit    Oral/Motor/Sensory Function Overall Oral Motor/Sensory Function: Impaired at baseline (pt does not follow comamnds)   Ice Chips     Thin Liquid  Thin Liquid: Impaired Presentation: Straw Pharyngeal  Phase Impairments: Suspected delayed Swallow    Nectar Thick Nectar Thick Liquid: Not tested   Honey Thick Honey Thick Liquid: Not tested   Puree Puree: Impaired Presentation: Spoon Oral Phase Impairments: Impaired anterior to posterior transit;Reduced lingual movement/coordination Oral Phase Functional Implications: Prolonged oral transit   Solid   GO    Solid: Not tested      Harlon DittyBonnie Lizzie An, MA CCC-SLP [phone]  Sherisse Fullilove, Riley NearingBonnie Caroline 09/30/2014,1:40 PM

## 2014-10-01 DIAGNOSIS — L304 Erythema intertrigo: Secondary | ICD-10-CM | POA: Diagnosis present

## 2014-10-01 DIAGNOSIS — E872 Acidosis, unspecified: Secondary | ICD-10-CM | POA: Diagnosis present

## 2014-10-01 DIAGNOSIS — A419 Sepsis, unspecified organism: Principal | ICD-10-CM

## 2014-10-01 DIAGNOSIS — G0490 Encephalitis and encephalomyelitis, unspecified: Secondary | ICD-10-CM | POA: Diagnosis present

## 2014-10-01 DIAGNOSIS — D696 Thrombocytopenia, unspecified: Secondary | ICD-10-CM

## 2014-10-01 DIAGNOSIS — R532 Functional quadriplegia: Secondary | ICD-10-CM | POA: Diagnosis present

## 2014-10-01 LAB — BASIC METABOLIC PANEL
ANION GAP: 11 (ref 5–15)
BUN: 8 mg/dL (ref 6–20)
CALCIUM: 7.4 mg/dL — AB (ref 8.9–10.3)
CO2: 21 mmol/L — ABNORMAL LOW (ref 22–32)
Chloride: 107 mmol/L (ref 101–111)
Creatinine, Ser: 0.53 mg/dL (ref 0.44–1.00)
Glucose, Bld: 74 mg/dL (ref 70–99)
POTASSIUM: 3.5 mmol/L (ref 3.5–5.1)
Sodium: 139 mmol/L (ref 135–145)

## 2014-10-01 LAB — CBC WITH DIFFERENTIAL/PLATELET
BASOS ABS: 0 10*3/uL (ref 0.0–0.1)
Basophils Relative: 0 % (ref 0–1)
Eosinophils Absolute: 0 10*3/uL (ref 0.0–0.7)
Eosinophils Relative: 0 % (ref 0–5)
HEMATOCRIT: 34.6 % — AB (ref 36.0–46.0)
Hemoglobin: 12 g/dL (ref 12.0–15.0)
LYMPHS PCT: 17 % (ref 12–46)
Lymphs Abs: 1.1 10*3/uL (ref 0.7–4.0)
MCH: 33.1 pg (ref 26.0–34.0)
MCHC: 34.7 g/dL (ref 30.0–36.0)
MCV: 95.6 fL (ref 78.0–100.0)
MONO ABS: 0.4 10*3/uL (ref 0.1–1.0)
Monocytes Relative: 6 % (ref 3–12)
NEUTROS ABS: 5.1 10*3/uL (ref 1.7–7.7)
Neutrophils Relative %: 77 % (ref 43–77)
Platelets: 38 10*3/uL — ABNORMAL LOW (ref 150–400)
RBC: 3.62 MIL/uL — ABNORMAL LOW (ref 3.87–5.11)
RDW: 18.7 % — AB (ref 11.5–15.5)
WBC: 6.7 10*3/uL (ref 4.0–10.5)

## 2014-10-01 LAB — LACTIC ACID, PLASMA: Lactic Acid, Venous: 1.9 mmol/L (ref 0.5–2.0)

## 2014-10-01 LAB — PATHOLOGIST SMEAR REVIEW

## 2014-10-01 MED ORDER — HYDROCORTISONE NA SUCCINATE PF 100 MG IJ SOLR
50.0000 mg | Freq: Two times a day (BID) | INTRAMUSCULAR | Status: DC
  Administered 2014-10-01 – 2014-10-02 (×2): 50 mg via INTRAVENOUS
  Filled 2014-10-01: qty 1

## 2014-10-01 MED ORDER — POTASSIUM CHLORIDE IN NACL 40-0.9 MEQ/L-% IV SOLN
INTRAVENOUS | Status: DC
  Administered 2014-10-01 – 2014-10-03 (×3): 50 mL/h via INTRAVENOUS
  Filled 2014-10-01 (×3): qty 1000

## 2014-10-01 MED ORDER — PREDNISONE 50 MG PO TABS
60.0000 mg | ORAL_TABLET | Freq: Every day | ORAL | Status: DC
  Administered 2014-10-02 – 2014-10-07 (×6): 60 mg via ORAL
  Filled 2014-10-01 (×6): qty 1

## 2014-10-01 MED ORDER — CLONAZEPAM 0.5 MG PO TABS
0.2500 mg | ORAL_TABLET | Freq: Every day | ORAL | Status: DC
  Administered 2014-10-01 – 2014-10-06 (×6): 0.25 mg via ORAL
  Filled 2014-10-01 (×6): qty 1

## 2014-10-01 MED ORDER — VALPROIC ACID 250 MG/5ML PO SYRP
750.0000 mg | ORAL_SOLUTION | Freq: Three times a day (TID) | ORAL | Status: DC
  Administered 2014-10-01 – 2014-10-07 (×18): 750 mg via ORAL
  Filled 2014-10-01 (×23): qty 15

## 2014-10-01 NOTE — Progress Notes (Signed)
Notified that patient's HR up, per tech, just turned and cleaned patient of BM. Instructed to recheck HR in 30 minutes or so with verbal understanding.

## 2014-10-01 NOTE — Progress Notes (Addendum)
[facility] to [phone] via bed; alert and oriented x 1; no signs of acute distress noted; IV saline locked in LH and LWrist and fluids running in right shoulder; skin - red, raw, moist -  MASD on perineum, buttocks, under breast (Interdry silver impregnated fabric). Patient non verbal, no follows verbal commands. Both upper extremities contracted against body; air matress on place. Call bell in place and fall risk precautions. Will continue to monitor the patient

## 2014-10-01 NOTE — Progress Notes (Addendum)
[facility] PROGRESS NOTE  Joan PeelingLora Turton ZOX:[id number] DOB: 04/03/1961 DOA: 09/29/2014 PCP: Angela Coxasanayaka, [name] Y, MD  Assessment/Plan:  Principal Problem:   Acute encephalopathy: EEG without epileptiform activity.  CT brain UA and CXR ok.  May be from moisture associated skin damage with likely fungal/bacterial superinfection. Improving today. Unsure of baseline. Will discuss with family. Will d/c flagyl and aztreonam. Started on fluconazole yesterday. Continue vancomycin.  Active Problems:   Sepsis syndrome: cx neg to date. Only source found is cellulitis. abdomen soft. Pt eating. Doubt bowel ischemia.  No need for CT abdomen pelvis at this point. Taper hydrocortisone today and resume prednisone tomorrow   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 Was on hospice and DNR in the past   Seizure disorder: change valproic acid to po. EEG with slowing only   Lactic acidosis resolved   Functional quadriplegia   Thrombocytopenia: secondary to acute illness. Peripheral smear pending. Anemia panel pending  Transfer to floor  Code Status:  full Family Communication:   Disposition Plan:  Home?  Consultants:  PCCM  WOC  neurology  Procedures:     Antibiotics:  Vancomycin 5/3 --  Diflucan 5/3 --  Flagyl 5/3-5/4  Aztreonam 5/3-5/4  HPI/Subjective: Per nursing staff, eating dysphagia diet. nonverbal  Objective: Filed Vitals:   10/01/14 0800  BP: 145/77  Pulse: 120  Temp:   Resp: 21    Intake/Output Summary (Last 24 hours) at 10/01/14 0841 Last data filed at 10/01/14 0800  Gross per 24 hour  Intake 3457.5 ml  Output    480 ml  Net 2977.5 ml   Filed Weights   09/30/14 0530  Weight: 60.1 kg (132 lb 7.9 oz)    Exam:   General:  Awake. Won't track, speak or follow commands. Eating puree with RN assistance  Cardiovascular: RRR without MGR  Respiratory: CTA without WRR  Abdomen: S, NT,  ND  Ext: contractures legs and feet. Edema.  Skin:  Severe erythema/desquamation induration medial thighs, groin and buttocks  Basic Metabolic Panel:  Recent Labs Lab 09/30/14 0010 09/30/14 0830 10/01/14 0250  NA 133* 137 139  K 3.7 3.3* 3.5  CL 92* 103 107  CO2 27 23 21*  GLUCOSE 120* 65* 74  BUN 14 9 8   CREATININE 1.08* 0.73 0.53  CALCIUM 8.6* 7.3* 7.4*   Liver Function Tests:  Recent Labs Lab 09/30/14 0010 09/30/14 0830  AST 65* 55*  ALT 28 23  ALKPHOS 66 49  BILITOT 1.4* 1.1  PROT 5.0* 4.4*  ALBUMIN 2.4* 2.0*   No results for input(s): LIPASE, AMYLASE in the last 168 hours.  Recent Labs Lab 09/30/14 0439  AMMONIA 29   CBC:  Recent Labs Lab 09/30/14 0145 09/30/14 0830 10/01/14 0714  WBC 7.1 7.3 6.7  NEUTROABS 4.9  --  5.1  HGB 8.9* 12.9 12.0  HCT 26.1* 37.7 34.6*  MCV 96.0 94.5 95.6  PLT 32* 41* 38*   Cardiac Enzymes: No results for input(s): CKTOTAL, CKMB, CKMBINDEX, TROPONINI in the last 168 hours. BNP (last 3 results) No results for input(s): BNP in the last 8760 hours.  ProBNP (last 3 results) No results for input(s): PROBNP in the last 8760 hours.  CBG: No results for input(s): GLUCAP in the last 168 hours.  Recent Results (from the past 240 hour(s))  Clostridium Difficile by PCR     Status: None   Collection Time: 09/30/14 12:37 AM  Result Value Ref Range Status  C difficile by pcr NEGATIVE NEGATIVE Final  MRSA PCR Screening     Status: Abnormal   Collection Time: 09/30/14  5:38 AM  Result Value Ref Range Status   MRSA by PCR POSITIVE (A) NEGATIVE Final    Comment:        The GeneXpert MRSA Assay (FDA approved for NASAL specimens only), is one component of a comprehensive MRSA colonization surveillance program. It is not intended to diagnose MRSA infection nor to guide or monitor treatment for MRSA infections. RESULT CALLED TO, READ BACK BY AND VERIFIED WITH: E SMITH,RN AT 0845 09/30/14 BY K BARR      Studies: Ct Head  Wo Contrast  09/30/2014   CLINICAL DATA:  Acute encephalopathy.  EXAM: CT HEAD WITHOUT CONTRAST  TECHNIQUE: Contiguous axial images were obtained from the base of the skull through the vertex without intravenous contrast.  COMPARISON:  12/03/2013 MRI  FINDINGS: There is no intracranial hemorrhage, mass or evidence of acute infarction. There is no extra-axial fluid collection. There is moderately severe generalized atrophy and moderate periventricular hypodensity which likely represents chronic small vessel disease. No superimposed acute findings are evident. No bony abnormalities are evident. The visible portions of the paranasal sinuses clear.  IMPRESSION: Moderately severe generalized atrophy and chronic small vessel disease. No acute findings.   Electronically Signed   By: Ellery Plunkaniel R Mitchell M.D.   On: 09/30/2014 04:45   Dg Chest Port 1 View  09/29/2014   CLINICAL DATA:  Altered mental status, some short of breath  EXAM: PORTABLE CHEST - 1 VIEW  COMPARISON:  Radiograph 01/03/2014  FINDINGS: Normal mediastinum and cardiac silhouette. Normal pulmonary vasculature. No evidence of effusion, infiltrate, or pneumothorax. No acute bony abnormality.  IMPRESSION: No acute cardiopulmonary process.   Electronically Signed   By: Genevive BiStewart  Edmunds M.D.   On: 09/29/2014 23:49    Scheduled Meds: . Chlorhexidine Gluconate Cloth  6 each Topical Q0600  . fluconazole (DIFLUCAN) IV  100 mg Intravenous Q24H  . hydrocortisone sod succinate (SOLU-CORTEF) inj  50 mg Intravenous Q6H  . mupirocin ointment  1 application Nasal BID  . valproate sodium  750 mg Intravenous 3 times per day  . vancomycin  750 mg Intravenous Q24H   Continuous Infusions:   Time spent: 35 minutes  Ardenia Stiner L  [facility] Pager [phone] [email or web address], password [email or web address] 10/01/2014, 8:41 AM  LOS: 1 day

## 2014-10-01 NOTE — Progress Notes (Signed)
Speech Language Pathology Treatment:    Patient Details Name: Joan [name] MRN: [id number] DOB: 08/08/1960 Today's Date: 10/01/2014 Time: [id number] SLP Time Calculation (min) (ACUTE ONLY): 15 min  Assessment / Plan / Recommendation Clinical Impression  Pt seen during am meal, NT had already given a portion of meal and had observed that pt began oral holding, likely indicating satiety. Despite this, SLP able to offer straw sips of thin liquids with moderate tactile cues for labial seal and suction; no signs of aspiration observed. Pt also accepted 5 bites of yogurt with oral holding evident on last bite, requiring verbal cues for oral transit. Pt is tolerating diet and intake seems adequate. MD questions if pt will take oral meds, recommended to RN that she try them crushed in ice cream. Will /fu for further tolerance.    HPI Other Pertinent Information: Joan [name] is a 54 y.o. 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  MD reports pt has sepsis of unclear etiology. As per patient's son patient has been largely bed bound for last 2 years. Pt had OP MBS on 11/29/13 showing moderate pharyngeal dysphagia with a delay in swallow, a severe oral dysphagia. Pt had multiple NG tube placed Recommended liquid diet, pt on hospice at the time.    Pertinent Vitals Pain Assessment: Faces Faces Pain Scale: Hurts little more Pain Descriptors / Indicators: Grimacing Pain Intervention(s): Repositioned  SLP Plan  Continue with current plan of care    Recommendations Diet recommendations: Dysphagia 1 (puree);Thin liquid Liquids provided via: Cup;Straw Medication Administration: Crushed with puree Supervision: Full supervision/cueing for compensatory strategies Compensations: Slow rate;Small sips/bites Postural Changes and/or Swallow Maneuvers: Seated upright  90 degrees              Oral Care Recommendations: Oral care BID Plan: Continue with current plan of care    GO    University Of Alabama HospitalBonnie Beonka Amesquita, MA CCC-SLP [phone]  Joan [name], Joan [name] 10/01/2014, 9:01 AM

## 2014-10-01 NOTE — Progress Notes (Signed)
[facility] ICU Electrolyte Replacement Protocol  Patient Name: Joan [name] DOB: 08-25-1960 MRN: [id number]  Date of Service  10/01/2014   HPI/Events of Note    Recent Labs Lab 09/30/14 0010 09/30/14 0830 10/01/14 0250  NA 133* 137 139  K 3.7 3.3* 3.5  CL 92* 103 107  CO2 27 23 21*  GLUCOSE 120* 65* 74  BUN 14 9 8   CREATININE 1.08* 0.73 0.53  CALCIUM 8.6* 7.3* 7.4*    Estimated Creatinine Clearance: 74 mL/min (by C-G formula based on Cr of 0.53).  Intake/Output      05/03 0701 - 05/04 0700   I.V. (mL/kg) 2120 (35.3)   Other 320   IV Piggyback 807.5   Total Intake(mL/kg) 3247.5 (54)   Urine (mL/kg/hr) 255 (0.2)   Total Output 255   Net +2992.5        - I/O DETAILED x24h    Total I/O In: 1127.5 [I.V.:770; IV Piggyback:357.5] Out: 195 [Urine:195] - I/O THIS SHIFT    ASSESSMENT   eICURN Interventions   Electrolyte protocol criteria NOT met. Lab values NOT replaced per protocol. MD notified   ASSESSMENT: MAJOR ELECTROLYTE    Lorene Dy 10/01/2014, 5:50 AM

## 2014-10-01 NOTE — Progress Notes (Signed)
Subjective: Patient makes eye contact and moans but has no intelligible verbal output.   Objective: Current vital signs: BP 112/65 mmHg  Pulse 106  Temp(Src) 98.1 F (36.7 C) (Oral)  Resp 16  Ht  (1.651 m)  Wt 60.1 kg (132 lb 7.9 oz)  BMI 22.05 kg/m2  SpO2 98% Vital signs in last 24 hours: Temp:  [98.1 F (36.7 C)-99 F (37.2 C)] 98.1 F (36.7 C) (05/04 0719) Pulse Rate:  [76-127] 106 (05/04 1000) Resp:  [10-28] 16 (05/04 1000) BP: (94-145)/(44-108) 112/65 mmHg (05/04 1000) SpO2:  [90 %-100 %] 98 % (05/04 1000)  Intake/Output from previous day: 05/03 0701 - 05/04 0700 In: 3247.5 [I.V.:2120; IV Piggyback:807.5] Out: 480 [Urine:380; Stool:100] Intake/Output this shift: Total I/O In: 510 [P.O.:360; IV Piggyback:150] Out: 200 [Urine:200] Nutritional status: DIET - DYS 1 Room service appropriate?: Yes; Fluid consistency:: Thin  Neurologic Exam:  Mental Status: Alert, moans when spoken to, tracks my finger. Unable to follow commands.  Cranial Nerves: II: blinks to threat, pupils equal, round, reactive to light and accommodation [name],IV, VI: ptosis not present, extra-ocular motions intact with lateral gaze but this is minimal in both directions.  V,VII: face symmetric, slight lip quivering, facial light touch sensation normal bilaterally VIII: hearing normal bilaterally IX,X: unable to assess XI: unable to assess XII: unable to visualize  Motor: Bilateral arms held in flexion contraction at wrist, elbow and arms held against body. Bilateral legs held in extension. Tremor noted in bilateral UE.  Sensory: winces to pain bilaterally Deep Tendon Reflexes:  No reflexes throughout  Plantars: Mute bilaterally    Lab Results: Basic Metabolic Panel:  Recent Labs Lab 09/30/14 0010 09/30/14 0830 10/01/14 0250  NA 133* 137 139  K 3.7 3.3* 3.5  CL 92* 103 107  CO2 27 23 21*  GLUCOSE 120* 65* 74  BUN CREATININE 1.08* 0.73 0.53  CALCIUM 8.6* 7.3* 7.4*     Liver Function Tests:  Recent Labs Lab 09/30/14 0010 09/30/14 0830  AST 65* 55*  ALT 28 23  ALKPHOS 66 49  BILITOT 1.4* 1.1  PROT 5.0* 4.4*  ALBUMIN 2.4* 2.0*   No results for input(s): LIPASE, AMYLASE in the last 168 hours.  Recent Labs Lab 09/30/14 0439  AMMONIA 29    CBC:  Recent Labs Lab 09/30/14 0145 09/30/14 0830 10/01/14 0714  WBC 7.1 7.3 6.7  NEUTROABS 4.9  --  5.1  HGB 8.9* 12.9 12.0  HCT 26.1* 37.7 34.6*  MCV 96.0 94.5 95.6  PLT 32* 41* 38*    Cardiac Enzymes: No results for input(s): CKTOTAL, CKMB, CKMBINDEX, TROPONINI in the last 168 hours.  Lipid Panel: No results for input(s): CHOL, TRIG, HDL, CHOLHDL, VLDL, LDLCALC in the last 168 hours.  CBG: No results for input(s): GLUCAP in the last 168 hours.  Microbiology: Results for orders placed or performed during the hospital encounter of 09/29/14  Blood Culture (routine x 2)     Status: None (Preliminary result)   Collection Time: 09/29/14 11:30 PM  Result Value Ref Range Status   Specimen Description BLOOD RIGHT FOREARM  Final   Special Requests BOTTLES DRAWN AEROBIC AND ANAEROBIC 5CC  Final   Culture   Final           BLOOD CULTURE RECEIVED NO GROWTH TO DATE CULTURE WILL BE HELD FOR 5 DAYS BEFORE ISSUING A FINAL NEGATIVE REPORT Performed at [facility]    Report Status PENDING  Incomplete  Blood Culture (  routine x 2)     Status: None (Preliminary result)   Collection Time: 09/30/14 12:08 AM  Result Value Ref Range Status   Specimen Description BLOOD RIGHT HAND  Final   Special Requests BOTTLES DRAWN AEROBIC ONLY 2CC  Final   Culture   Final           BLOOD CULTURE RECEIVED NO GROWTH TO DATE CULTURE WILL BE HELD FOR 5 DAYS BEFORE ISSUING A FINAL NEGATIVE REPORT [facility]    Report Status PENDING  Incomplete  Clostridium Difficile by PCR     Status: None   Collection Time: 09/30/14 12:37 AM  Result Value Ref Range Status   C difficile by pcr  NEGATIVE NEGATIVE Final  Urine culture     Status: None (Preliminary result)   Collection Time: 09/30/14  1:04 AM  Result Value Ref Range Status   Specimen Description URINE, CATHETERIZED  Final   Special Requests NONE  Final   Colony Count   Final    >=100,000 COLONIES/ML [facility] at [facility]    Culture   Final    ENTEROCOCCUS SPECIES [facility]formed at [facility]    Report Status PENDING  Incomplete  MRSA PCR Screening     Status: Abnormal   Collection Time: 09/30/14  5:38 AM  Result Value Ref Range Status   MRSA by PCR POSITIVE (A) NEGATIVE Final    Comment:        The GeneXpert MRSA Assay (FDA approved for NASAL specimens only), is one component of a comprehensive MRSA colonization surveillance program. It is not intended to diagnose MRSA infection nor to guide or monitor treatment for MRSA infections. RESULT CALLED TO, READ BACK BY AND VERIFIED WITH: E SMITH,RN AT 0845 09/30/14 BY K BARR     Coagulation Studies:  Recent Labs  09/30/14 0830  LABPROT 16.5*  INR 1.32    Imaging: Ct Head Wo Contrast  09/30/2014   CLINICAL DATA:  Acute encephalopathy.  EXAM: CT HEAD WITHOUT CONTRAST  TECHNIQUE: Contiguous axial images were obtained from the base of the skull through the vertex without intravenous contrast.  COMPARISON:  12/03/2013 MRI  FINDINGS: There is no intracranial hemorrhage, mass or evidence of acute infarction. There is no extra-axial fluid collection. There is moderately severe generalized atrophy and moderate periventricular hypodensity which likely represents chronic small vessel disease. No superimposed acute findings are evident. No bony abnormalities are evident. The visible portions of the paranasal sinuses clear.  IMPRESSION: Moderately severe generalized atrophy and chronic small vessel disease. No acute findings.   Electronically Signed   By: Ellery Plunkaniel R Mitchell M.D.   On: 09/30/2014 04:45   Dg Chest Port 1 View  09/29/2014   CLINICAL DATA:   Altered mental status, some short of breath  EXAM: PORTABLE CHEST - 1 VIEW  COMPARISON:  Radiograph 01/03/2014  FINDINGS: Normal mediastinum and cardiac silhouette. Normal pulmonary vasculature. No evidence of effusion, infiltrate, or pneumothorax. No acute bony abnormality.  IMPRESSION: No acute cardiopulmonary process.   Electronically Signed   By: Genevive BiStewart  Edmunds M.D.   On: 09/29/2014 23:49    Medications:  Scheduled: . Chlorhexidine Gluconate Cloth  6 each Topical Q0600  . clonazePAM  0.25 mg Oral QHS  . fluconazole (DIFLUCAN) IV  100 mg Intravenous Q24H  . hydrocortisone sod succinate (SOLU-CORTEF) inj  50 mg Intravenous Q12H  . mupirocin ointment  1 application Nasal BID  . [START ON 10/02/2014] predniSONE  60 mg Oral Daily  .  Valproic Acid  750 mg Oral Q8H  . vancomycin  750 mg Intravenous Q24H    Felicie MornDavid Smith PA-C [facility] [phone]  10/01/2014, 11:06 AM  Patient seen and examined.  Clinical course and management discussed.  Necessary edits performed.  I agree with the above.  Assessment and plan of care developed and discussed below.   Assessment/Plan: 54 YO female with neurological decline. Head CT negative and EEG shows slowing. Urine culture positive for  Enterococcus. Currently on Vancomycin and Diflucan. Blood cultures pending. Wound service continues to address breakdown.  Unclear baseline for patient.   Recommendations: 1.  Agree with current management 2.  Will contact family concerning baseline    Thana FarrLeslie Grasiela Jonsson, MD [facility] [phone]  10/01/2014  11:19 AM

## 2014-10-02 LAB — CBC
HEMATOCRIT: 37.9 % (ref 36.0–46.0)
Hemoglobin: 13.1 g/dL (ref 12.0–15.0)
MCH: 32.4 pg (ref 26.0–34.0)
MCHC: 34.6 g/dL (ref 30.0–36.0)
MCV: 93.8 fL (ref 78.0–100.0)
PLATELETS: 55 10*3/uL — AB (ref 150–400)
RBC: 4.04 MIL/uL (ref 3.87–5.11)
RDW: 19.4 % — AB (ref 11.5–15.5)
WBC: 7 10*3/uL (ref 4.0–10.5)

## 2014-10-02 LAB — URINE CULTURE: Colony Count: >=100000

## 2014-10-02 LAB — FOLATE: Folate: 11 ng/mL (ref >5.9)

## 2014-10-02 LAB — BASIC METABOLIC PANEL
ANION GAP: 10 (ref 5–15)
BUN: <5 mg/dL — ABNORMAL LOW (ref 6–20)
CALCIUM: 8.5 mg/dL — AB (ref 8.9–10.3)
CO2: 24 mmol/L (ref 22–32)
Chloride: 110 mmol/L (ref 101–111)
Creatinine, Ser: 0.6 mg/dL (ref 0.44–1.00)
GFR calc non Af Amer: >60 mL/min (ref >60)
GLUCOSE: 82 mg/dL (ref 70–99)
POTASSIUM: 3 mmol/L — AB (ref 3.5–5.1)
SODIUM: 144 mmol/L (ref 135–145)

## 2014-10-02 LAB — MAGNESIUM: Magnesium: 1.6 mg/dL — ABNORMAL LOW (ref 1.7–2.4)

## 2014-10-02 LAB — IRON AND TIBC
Iron: 123 ug/dL (ref 28–170)
SATURATION RATIOS: 83 % — AB (ref 10.4–31.8)
TIBC: 148 ug/dL — AB (ref 250–450)
UIBC: 25 ug/dL

## 2014-10-02 LAB — VITAMIN B12: VITAMIN B 12: 2445 pg/mL — AB (ref 180–914)

## 2014-10-02 LAB — FERRITIN: Ferritin: 1424 ng/mL — ABNORMAL HIGH (ref 11–307)

## 2014-10-02 MED ORDER — POTASSIUM CHLORIDE 10 MEQ/100ML IV SOLN
10.0000 meq | INTRAVENOUS | Status: AC
  Administered 2014-10-02 (×2): 10 meq via INTRAVENOUS
  Filled 2014-10-02 (×2): qty 100

## 2014-10-02 MED ORDER — MAGNESIUM SULFATE 2 GM/50ML IV SOLN
2.0000 g | Freq: Once | INTRAVENOUS | Status: AC
  Administered 2014-10-02: 2 g via INTRAVENOUS
  Filled 2014-10-02: qty 50

## 2014-10-02 NOTE — Progress Notes (Addendum)
[facility] PROGRESS NOTE  Joan [name] ZOX:[id number] DOB: 07/26/1960 DOA: 09/29/2014 PCP: Joan Coxasanayaka, [name] Y, MD  Assessment/Plan:  Principal Problem:   Acute encephalopathy: EEG without epileptiform activity.  CT brain UA and CXR ok.  May be from moisture associated skin damage with likely fungal/bacterial superinfection. Improving today. Continue vanc and diflucan. Per family, speaks occasionally. Had been on hospice, but discharged. Full code Active Problems:   Sepsis syndrome: cx neg to date. Only source found is cellulitis.    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    Seizure disorder: change valproic acid to po. EEG with slowing only   Lactic acidosis resolved   Functional quadriplegia   Thrombocytopenia: secondary to acute illness. Anemia panel pending Hypokalemia: replete and check mag   Code Status:  full Family Communication:  Son by phone Disposition Plan:  Home when stable  Consultants:  PCCM  WOC  neurology  Procedures:     Antibiotics:  Vancomycin 5/3 --  Diflucan 5/3 --  Flagyl 5/3-5/4  Aztreonam 5/3-5/4  HPI/Subjective: Per nursing staff, not eating much. Does take pills  Objective: Filed Vitals:   10/02/14 0555  BP: 131/78  Pulse: 73  Temp: 98.3 F (36.8 C)  Resp: 16    Intake/Output Summary (Last 24 hours) at 10/02/14 0936 Last data filed at 10/02/14 0917  Gross per 24 hour  Intake   1260 ml  Output   1200 ml  Net     60 ml   Filed Weights   09/30/14 0530  Weight: 60.1 kg (132 lb 7.9 oz)    Exam:   General:  Asleep. Opens eyes to voice. Wont answer questions  Cardiovascular: RRR without MGR  Respiratory: CTA without WRR  Abdomen: S, NT, ND  Ext: contractures legs and feet. Edema.  Skin:  Severe erythema/desquamation induration medial thighs, groin and buttocks  Basic Metabolic Panel:  Recent Labs Lab 09/30/14 0010 09/30/14 0830  10/01/14 0250 10/02/14 0710  NA 133* 137 139 144  K 3.7 3.3* 3.5 3.0*  CL 92* 103 107 110  CO2 27 23 21* 24  GLUCOSE 120* 65* 74 82  BUN 14 9 8  <5*  CREATININE 1.08* 0.73 0.53 0.60  CALCIUM 8.6* 7.3* 7.4* 8.5*   Liver Function Tests:  Recent Labs Lab 09/30/14 0010 09/30/14 0830  AST 65* 55*  ALT 28 23  ALKPHOS 66 49  BILITOT 1.4* 1.1  PROT 5.0* 4.4*  ALBUMIN 2.4* 2.0*   No results for input(s): LIPASE, AMYLASE in the last 168 hours.  Recent Labs Lab 09/30/14 0439  AMMONIA 29   CBC:  Recent Labs Lab 09/30/14 0145 09/30/14 0830 10/01/14 0714 10/02/14 0710  WBC 7.1 7.3 6.7 7.0  NEUTROABS 4.9  --  5.1  --   HGB 8.9* 12.9 12.0 13.1  HCT 26.1* 37.7 34.6* 37.9  MCV 96.0 94.5 95.6 93.8  PLT 32* 41* 38* 55*   Cardiac Enzymes: No results for input(s): CKTOTAL, CKMB, CKMBINDEX, TROPONINI in the last 168 hours. BNP (last 3 results) No results for input(s): BNP in the last 8760 hours.  ProBNP (last 3 results) No results for input(s): PROBNP in the last 8760 hours.  CBG: No results for input(s): GLUCAP in the last 168 hours.  Recent Results (from the past 240 hour(s))  Blood Culture (routine x 2)     Status: None (Preliminary result)   Collection Time: 09/29/14 11:30 PM  Result Value Ref Range Status  Specimen Description BLOOD RIGHT FOREARM  Final   Special Requests BOTTLES DRAWN AEROBIC AND ANAEROBIC 5CC  Final   Culture   Final           BLOOD CULTURE RECEIVED NO GROWTH TO DATE CULTURE WILL BE HELD FOR 5 DAYS BEFORE ISSUING A FINAL NEGATIVE REPORT [facility]formed [facility]    Report Status PENDING  Incomplete  Blood Culture (routine x 2)     Status: None (Preliminary result)   Collection Time: 09/30/14 12:08 AM  Result Value Ref Range Status   Specimen Description BLOOD RIGHT HAND  Final   Special Requests BOTTLES DRAWN AEROBIC ONLY 2CC  Final   Culture   Final           BLOOD CULTURE RECEIVED NO GROWTH TO DATE CULTURE WILL BE HELD FOR 5 DAYS  BEFORE ISSUING A FINAL NEGATIVE REPORT Performed at [facility]    Report Status PENDING  Incomplete  Clostridium Difficile by PCR     Status: None   Collection Time: 09/30/14 12:37 AM  Result Value Ref Range Status   C difficile by pcr NEGATIVE NEGATIVE Final  Urine culture     Status: None (Preliminary result)   Collection Time: 09/30/14  1:04 AM  Result Value Ref Range Status   Specimen Description URINE, CATHETERIZED  Final   Special Requests NONE  Final   Colony Count   Final    >=100,000 COLONIES/ML Performed at [facility]    Culture   Final    ENTEROCOCCUS SPECIES Performed at [facility]    Report Status PENDING  Incomplete  MRSA PCR Screening     Status: Abnormal   Collection Time: 09/30/14  5:38 AM  Result Value Ref Range Status   MRSA by PCR POSITIVE (A) NEGATIVE Final    Comment:        The GeneXpert MRSA Assay (FDA approved for NASAL specimens only), is one component of a comprehensive MRSA colonization surveillance program. It is not intended to diagnose MRSA infection nor to guide or monitor treatment for MRSA infections. RESULT CALLED TO, READ BACK BY AND VERIFIED WITH: E SMITH,RN AT 0845 09/30/14 BY K BARR      Studies: No results found.  Scheduled Meds: . Chlorhexidine Gluconate Cloth  6 each Topical Q0600  . clonazePAM  0.25 mg Oral QHS  . fluconazole (DIFLUCAN) IV  100 mg Intravenous Q24H  . mupirocin ointment  1 application Nasal BID  . potassium chloride  10 mEq Intravenous Q1 Hr x 2  . predniSONE  60 mg Oral Daily  . Valproic Acid  750 mg Oral Q8H  . vancomycin  750 mg Intravenous Q24H   Continuous Infusions: . 0.9 % NaCl with KCl 40 mEq / L 50 mL/hr (10/02/14 0524)    Time spent: 25 minutes  [name] L  [facility] Pager [phone] [email or web address], password [facility] 10/02/2014, 9:36 AM  LOS: 2 days

## 2014-10-03 LAB — CBC
HEMATOCRIT: 33.8 % — AB (ref 36.0–46.0)
Hemoglobin: 11.6 g/dL — ABNORMAL LOW (ref 12.0–15.0)
MCH: 32.2 pg (ref 26.0–34.0)
MCHC: 34.3 g/dL (ref 30.0–36.0)
MCV: 93.9 fL (ref 78.0–100.0)
Platelets: 46 10*3/uL — ABNORMAL LOW (ref 150–400)
RBC: 3.6 MIL/uL — ABNORMAL LOW (ref 3.87–5.11)
RDW: 20.5 % — AB (ref 11.5–15.5)
WBC: 6.2 10*3/uL (ref 4.0–10.5)

## 2014-10-03 LAB — BASIC METABOLIC PANEL
Anion gap: 7 (ref 5–15)
BUN: <5 mg/dL — ABNORMAL LOW (ref 6–20)
CALCIUM: 8.4 mg/dL — AB (ref 8.9–10.3)
CO2: 25 mmol/L (ref 22–32)
CREATININE: 0.52 mg/dL (ref 0.44–1.00)
Chloride: 114 mmol/L — ABNORMAL HIGH (ref 101–111)
GFR calc Af Amer: >60 mL/min (ref >60)
GFR calc non Af Amer: >60 mL/min (ref >60)
Glucose, Bld: 69 mg/dL — ABNORMAL LOW (ref 70–99)
Potassium: 3.7 mmol/L (ref 3.5–5.1)
Sodium: 146 mmol/L — ABNORMAL HIGH (ref 135–145)

## 2014-10-03 LAB — VANCOMYCIN, TROUGH: Vancomycin Tr: 15 ug/mL (ref 10.0–20.0)

## 2014-10-03 MED ORDER — ENSURE ENLIVE PO LIQD
237.0000 mL | Freq: Two times a day (BID) | ORAL | Status: DC
  Administered 2014-10-03 – 2014-10-07 (×7): 237 mL via ORAL

## 2014-10-03 MED ORDER — PRO-STAT SUGAR FREE PO LIQD
30.0000 mL | Freq: Two times a day (BID) | ORAL | Status: DC
  Administered 2014-10-03 – 2014-10-07 (×8): 30 mL via ORAL
  Filled 2014-10-03 (×10): qty 30

## 2014-10-03 MED ORDER — POTASSIUM CL IN DEXTROSE 5% 20 MEQ/L IV SOLN
20.0000 meq | INTRAVENOUS | Status: DC
  Administered 2014-10-03 – 2014-10-04 (×2): 20 meq via INTRAVENOUS
  Filled 2014-10-03 (×4): qty 1000

## 2014-10-03 MED ORDER — ADULT MULTIVITAMIN W/MINERALS CH
1.0000 | ORAL_TABLET | Freq: Every day | ORAL | Status: DC
  Administered 2014-10-03 – 2014-10-07 (×5): 1 via ORAL
  Filled 2014-10-03 (×5): qty 1

## 2014-10-03 NOTE — Progress Notes (Signed)
Subjective: Patient more alert today.  Able to respond verbally to questioning.  Reports that stomach hurts.  Stooling frequently.    Objective: Current vital signs: BP 124/79 mmHg  Pulse 107  Temp(Src) 98.4 F (36.9 C) (Oral)  Resp 20  Ht 5\' 5"  (1.651 m)  Wt 58.6 kg (129 lb 3 oz)  BMI 21.50 kg/m2  SpO2 98% Vital signs in last 24 hours: Temp:  [98.4 F (36.9 C)] 98.4 F (36.9 C) (05/06 0548) Pulse Rate:  [61-107] 107 (05/05 2151) Resp:  [16-20] 20 (05/06 0548) BP: (113-127)/(71-106) 124/79 mmHg (05/06 0548) SpO2:  [98 %-100 %] 98 % (05/06 0548) Weight:  [56.6 kg (124 lb 12.5 oz)-58.6 kg (129 lb 3 oz)] 58.6 kg (129 lb 3 oz) (05/06 0600)  Intake/Output from previous day: 05/05 0701 - 05/06 0700 In: 1115.8 [P.O.:120; I.V.:795.8; IV Piggyback:200] Out: 400 [Urine:400] Intake/Output this shift: Total I/O In: -  Out: 450 [Urine:450] Nutritional status: DIET - DYS 1 Room service appropriate?: Yes; Fluid consistency:: Thin  Neurologic Exam: Mental Status: Opens eyes when name called.  Responds appropriately to questions being asked but does have some delay.     Cranial Nerves: II: blinks to threat, pupils equal, round, reactive to light and accommodation [name],IV, VI: ptosis not present, extra-ocular motions intact with lateral gaze but this is minimal in both directions.  V,VII: mild right facial droop VIII: hearing normal bilaterally Motor: Bilateral arms held in flexion contraction at wrist, elbow and arms held against body. Bilateral legs held in extension. Tremor noted in bilateral UE.  Sensory: winces to pain bilaterally   Lab Results: Basic Metabolic Panel:  Recent Labs Lab 09/30/14 0010 09/30/14 0830 10/01/14 0250 10/02/14 0710 10/03/14 0518  NA 133* 137 139 144 146*  K 3.7 3.3* 3.5 3.0* 3.7  CL 92* 103 107 110 114*  CO2 27 23 21* 24 25  GLUCOSE 120* 65* 74 82 69*  BUN 14 9 8  <5* <5*  CREATININE 1.08* 0.73 0.53 0.60 0.52  CALCIUM 8.6* 7.3* 7.4* 8.5*  8.4*  MG  --   --   --  1.6*  --     Liver Function Tests:  Recent Labs Lab 09/30/14 0010 09/30/14 0830  AST 65* 55*  ALT 28 23  ALKPHOS 66 49  BILITOT 1.4* 1.1  PROT 5.0* 4.4*  ALBUMIN 2.4* 2.0*   No results for input(s): LIPASE, AMYLASE in the last 168 hours.  Recent Labs Lab 09/30/14 0439  AMMONIA 29    CBC:  Recent Labs Lab 09/30/14 0145 09/30/14 0830 10/01/14 0714 10/02/14 0710 10/03/14 0518  WBC 7.1 7.3 6.7 7.0 6.2  NEUTROABS 4.9  --  5.1  --   --   HGB 8.9* 12.9 12.0 13.1 11.6*  HCT 26.1* 37.7 34.6* 37.9 33.8*  MCV 96.0 94.5 95.6 93.8 93.9  PLT 32* 41* 38* 55* 46*    Cardiac Enzymes: No results for input(s): CKTOTAL, CKMB, CKMBINDEX, TROPONINI in the last 168 hours.  Lipid Panel: No results for input(s): CHOL, TRIG, HDL, CHOLHDL, VLDL, LDLCALC in the last 168 hours.  CBG: No results for input(s): GLUCAP in the last 168 hours.  Microbiology: Results for orders placed or performed during the hospital encounter of 09/29/14  Blood Culture (routine x 2)     Status: None (Preliminary result)   Collection Time: 09/29/14 11:30 PM  Result Value Ref Range Status   Specimen Description BLOOD RIGHT FOREARM  Final   Special Requests BOTTLES DRAWN AEROBIC AND ANAEROBIC 5CC  Final   Culture   Final           BLOOD CULTURE RECEIVED NO GROWTH TO DATE CULTURE WILL BE HELD FOR 5 DAYS BEFORE ISSUING A FINAL NEGATIVE REPORT Performed at [facility]    Report Status PENDING  Incomplete  Blood Culture (routine x 2)     Status: None (Preliminary result)   Collection Time: 09/30/14 12:08 AM  Result Value Ref Range Status   Specimen Description BLOOD RIGHT HAND  Final   Special Requests BOTTLES DRAWN AEROBIC ONLY 2CC  Final   Culture   Final           BLOOD CULTURE RECEIVED NO GROWTH TO DATE CULTURE WILL BE HELD FOR 5 DAYS BEFORE ISSUING A FINAL NEGATIVE REPORT Performed at [facility]    Report Status PENDING  Incomplete  Clostridium Difficile  by PCR     Status: None   Collection Time: 09/30/14 12:37 AM  Result Value Ref Range Status   C difficile by pcr NEGATIVE NEGATIVE Final  Urine culture     Status: None   Collection Time: 09/30/14  1:04 AM  Result Value Ref Range Status   Specimen Description URINE, CATHETERIZED  Final   Special Requests NONE  Final   Colony Count   Final    >=100,000 COLONIES/ML Performed at [facility]    Culture   Final    ENTEROCOCCUS SPECIES Performed at [facility]    Report Status 10/02/2014 FINAL  Final   Organism ID, Bacteria ENTEROCOCCUS SPECIES  Final      Susceptibility   Enterococcus species - MIC*    AMPICILLIN <=2 SENSITIVE Sensitive     LEVOFLOXACIN >=8 RESISTANT Resistant     NITROFURANTOIN 32 SENSITIVE Sensitive     VANCOMYCIN 2 SENSITIVE Sensitive     TETRACYCLINE >=16 RESISTANT Resistant     * ENTEROCOCCUS SPECIES  MRSA PCR Screening     Status: Abnormal   Collection Time: 09/30/14  5:38 AM  Result Value Ref Range Status   MRSA by PCR POSITIVE (A) NEGATIVE Final    Comment:        The GeneXpert MRSA Assay (FDA approved for NASAL specimens only), is one component of a comprehensive MRSA colonization surveillance program. It is not intended to diagnose MRSA infection nor to guide or monitor treatment for MRSA infections. RESULT CALLED TO, READ BACK BY AND VERIFIED WITH: E SMITH,RN AT 0845 09/30/14 BY K BARR     Coagulation Studies: No results for input(s): LABPROT, INR in the last 72 hours.  Imaging: No results found.  Medications:  I have reviewed the patient's current medications. Scheduled: . Chlorhexidine Gluconate Cloth  6 each Topical Q0600  . clonazePAM  0.25 mg Oral QHS  . fluconazole (DIFLUCAN) IV  100 mg Intravenous Q24H  . mupirocin ointment  1 application Nasal BID  . predniSONE  60 mg Oral Daily  . Valproic Acid  750 mg Oral Q8H  . vancomycin  750 mg Intravenous Q24H    Assessment/Plan: Patient improved.  More verbal.   Seems to be nearing baseline.    Recommendations: 1.  Agree with current management   LOS: 3 days   Thana FarrLeslie Dallin Mccorkel, MD [facility] [phone] 10/03/2014  9:44 AM

## 2014-10-03 NOTE — Progress Notes (Signed)
Speech Language Pathology Treatment: Dysphagia  Patient Details Name: Joan [name] MRN: [id number] DOB: 01/14/1961 Today's Date: 10/03/2014 Time: 0950-1000 SLP Time Calculation (min) (ACUTE ONLY): 10 min  Assessment / Plan / Recommendation Clinical Impression  Pt's MS is approaching baseline per neuro notes.  Pt answered limited yes/no questions this a.m. - amenable to consuming limited amounts of liquid/purees but then shook head no to decline further food.  Oral manipulation of purees/thin liquids prolonged but functional; consumed multiple boluses of water from a straw with no overt s/s of aspiration. Overall min verbal cues needed.  MS allowed safe eating this a.m.  Continue careful hand-feeding; meds crushed in ice cream.     HPI Other Pertinent Information: Joan [name] is a 54 y.o. 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  MD reports pt has sepsis of unclear etiology. As per patient's son patient has been largely bed bound for last 2 years. Pt had OP MBS on 11/29/13 showing moderate pharyngeal dysphagia with a delay in swallow, a severe oral dysphagia. Pt had multiple NG tube placed Recommended liquid diet, pt on hospice at the time.    Pertinent Vitals Pain Assessment: Faces Faces Pain Scale: Hurts a little bit Pain Descriptors / Indicators: Grimacing Pain Intervention(s): Repositioned  SLP Plan  Continue with current plan of care    Recommendations Diet recommendations: Dysphagia 1 (puree);Thin liquid Liquids provided via: Cup;Straw Medication Administration: Crushed with puree Supervision: Full supervision/cueing for compensatory strategies Compensations: Slow rate;Small sips/bites Postural Changes and/or Swallow Maneuvers: Seated upright 90 degrees              Oral Care Recommendations: Oral care BID Plan: Continue with  current plan of care   Sharene Krikorian L. Samson Fredericouture, [place] CCC/SLP Pager [phone]      Blenda MountsCouture, Tanuj Mullens Laurice 10/03/2014, 10:12 AM

## 2014-10-03 NOTE — Progress Notes (Signed)
[facility] PROGRESS NOTE  Candida PeelingLora Nordling ZOX:[id number] DOB: 10/01/1960 DOA: 09/29/2014 PCP: Angela Coxasanayaka, [name] Y, MD  Assessment/Plan:  Principal Problem:   Acute encephalopathy: EEG without epileptiform activity.  CT brain UA and CXR ok.  May be from moisture associated skin damage with likely fungal/bacterial superinfection. Improving today. Continue vanc and diflucan. Per family, speaks occasionally. Had been on hospice, but discharged. Full code Active Problems:   Sepsis syndrome: cx neg to date.    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    Seizure disorder: change valproic acid to po. EEG with slowing only   Lactic acidosis resolved   Functional quadriplegia   Thrombocytopenia: secondary to acute illness. Anemia panel pending Hypokalemia: replete and check mag Loose stool. c diff neg. Check GI pathogen panel  Code Status:  full Family Communication:  Son by phone Disposition Plan:  Home when stable  Consultants:  PCCM  WOC  neurology  Procedures:     Antibiotics:  Vancomycin 5/3 --  Diflucan 5/3 --  Flagyl 5/3-5/4  Aztreonam 5/3-5/4  HPI/Subjective: Per nursing staff, not eating much. Multiple loose stools  Objective: Filed Vitals:   10/03/14 1340  BP: 128/66  Pulse: 98  Temp: 99 F (37.2 C)  Resp: 24    Intake/Output Summary (Last 24 hours) at 10/03/14 1352 Last data filed at 10/03/14 0915  Gross per 24 hour  Intake    395 ml  Output   1350 ml  Net   -955 ml   Filed Weights   09/30/14 0530 10/03/14 0500 10/03/14 0600  Weight: 60.1 kg (132 lb 7.9 oz) 56.6 kg (124 lb 12.5 oz) 58.6 kg (129 lb 3 oz)    Exam:   General:  Asleep. Opens eyes to voice. Wont answer questions  Cardiovascular: RRR without MGR  Respiratory: CTA without WRR  Abdomen: S, NT, ND  Ext: contractures legs and feet. Edema.  Skin:  desquamation  induration medial thighs, groin and buttocks. Slightly less erythematous.  Basic Metabolic Panel:  Recent Labs Lab 09/30/14 0010 09/30/14 0830 10/01/14 0250 10/02/14 0710 10/03/14 0518  NA 133* 137 139 144 146*  K 3.7 3.3* 3.5 3.0* 3.7  CL 92* 103 107 110 114*  CO2 27 23 21* 24 25  GLUCOSE 120* 65* 74 82 69*  BUN 14 9 8  <5* <5*  CREATININE 1.08* 0.73 0.53 0.60 0.52  CALCIUM 8.6* 7.3* 7.4* 8.5* 8.4*  MG  --   --   --  1.6*  --    Liver Function Tests:  Recent Labs Lab 09/30/14 0010 09/30/14 0830  AST 65* 55*  ALT 28 23  ALKPHOS 66 49  BILITOT 1.4* 1.1  PROT 5.0* 4.4*  ALBUMIN 2.4* 2.0*   No results for input(s): LIPASE, AMYLASE in the last 168 hours.  Recent Labs Lab 09/30/14 0439  AMMONIA 29   CBC:  Recent Labs Lab 09/30/14 0145 09/30/14 0830 10/01/14 0714 10/02/14 0710 10/03/14 0518  WBC 7.1 7.3 6.7 7.0 6.2  NEUTROABS 4.9  --  5.1  --   --   HGB 8.9* 12.9 12.0 13.1 11.6*  HCT 26.1* 37.7 34.6* 37.9 33.8*  MCV 96.0 94.5 95.6 93.8 93.9  PLT 32* 41* 38* 55* 46*   Cardiac Enzymes: No results for input(s): CKTOTAL, CKMB, CKMBINDEX, TROPONINI in the last 168 hours. BNP (last 3 results) No results for input(s): BNP in the last 8760 hours.  ProBNP (last 3  results) No results for input(s): PROBNP in the last 8760 hours.  CBG: No results for input(s): GLUCAP in the last 168 hours.  Recent Results (from the past 240 hour(s))  Blood Culture (routine x 2)     Status: None (Preliminary result)   Collection Time: 09/29/14 11:30 PM  Result Value Ref Range Status   Specimen Description BLOOD RIGHT FOREARM  Final   Special Requests BOTTLES DRAWN AEROBIC AND ANAEROBIC 5CC  Final   Culture   Final           BLOOD CULTURE RECEIVED NO GROWTH TO DATE CULTURE WILL BE HELD FOR 5 DAYS BEFORE ISSUING A FINAL NEGATIVE REPORT Performed at [facility]    Report Status PENDING  Incomplete  Blood Culture (routine x 2)     Status: None (Preliminary result)    Collection Time: 09/30/14 12:08 AM  Result Value Ref Range Status   Specimen Description BLOOD RIGHT HAND  Final   Special Requests BOTTLES DRAWN AEROBIC ONLY 2CC  Final   Culture   Final           BLOOD CULTURE RECEIVED NO GROWTH TO DATE CULTURE WILL BE HELD FOR 5 DAYS BEFORE ISSUING A FINAL NEGATIVE REPORT Performed at [facility]    Report Status PENDING  Incomplete  Clostridium Difficile by PCR     Status: None   Collection Time: 09/30/14 12:37 AM  Result Value Ref Range Status   C difficile by pcr NEGATIVE NEGATIVE Final  Urine culture     Status: None   Collection Time: 09/30/14  1:04 AM  Result Value Ref Range Status   Specimen Description URINE, CATHETERIZED  Final   Special Requests NONE  Final   Colony Count   Final    >=100,000 COLONIES/ML Performed at [facility]    Culture   Final    ENTEROCOCCUS SPECIES Performed at [facility]    Report Status 10/02/2014 FINAL  Final   Organism ID, Bacteria ENTEROCOCCUS SPECIES  Final      Susceptibility   Enterococcus species - MIC*    AMPICILLIN <=2 SENSITIVE Sensitive     LEVOFLOXACIN >=8 RESISTANT Resistant     NITROFURANTOIN 32 SENSITIVE Sensitive     VANCOMYCIN 2 SENSITIVE Sensitive     TETRACYCLINE >=16 RESISTANT Resistant     * ENTEROCOCCUS SPECIES  MRSA PCR Screening     Status: Abnormal   Collection Time: 09/30/14  5:38 AM  Result Value Ref Range Status   MRSA by PCR POSITIVE (A) NEGATIVE Final    Comment:        The GeneXpert MRSA Assay (FDA approved for NASAL specimens only), is one component of a comprehensive MRSA colonization surveillance program. It is not intended to diagnose MRSA infection nor to guide or monitor treatment for MRSA infections. RESULT CALLED TO, READ BACK BY AND VERIFIED WITH: E SMITH,RN AT 0845 09/30/14 BY K BARR      Studies: No results found.  Scheduled Meds: . Chlorhexidine Gluconate Cloth  6 each Topical Q0600  . clonazePAM  0.25 mg Oral QHS  .  fluconazole (DIFLUCAN) IV  100 mg Intravenous Q24H  . mupirocin ointment  1 application Nasal BID  . predniSONE  60 mg Oral Daily  . Valproic Acid  750 mg Oral Q8H  . vancomycin  750 mg Intravenous Q24H   Continuous Infusions: . dextrose 5 % with KCl 20 mEq / L 20 mEq (10/03/14 0848)    Time  spent: 25 minutes  Azariel Banik L  [facility] Pager [phone] [email or web address], password [email or web address] 10/03/2014, 1:52 PM  LOS: 3 days

## 2014-10-03 NOTE — Plan of Care (Signed)
Problem: Phase I Progression Outcomes Goal: OOB as tolerated unless otherwise ordered Outcome: Not Met (add Reason) Contacture, bedbound. MD aware Goal: Voiding-avoid urinary catheter unless indicated Outcome: Not Met (add Reason) Chronic foley use

## 2014-10-03 NOTE — Progress Notes (Addendum)
ANTIBIOTIC CONSULT NOTE - FOLLOW UP  Pharmacy Consult for vancomycin  Indication: rule out sepsis/sacral decubs  Allergies  Allergen Reactions  . Penicillins Other (See Comments), Rash Joan Shortness Of Breath    REACTION: Makes her body hurt.  . Citalopram Nausea Joan Vomiting  . Clindamycin Rash Joan Other (See Comments)    GI upset  . Duloxetine Hcl Other (See Comments)    GI upset   . Escitalopram Nausea Joan Vomiting  . Hydromorphone Other (See Comments)    Generalized severe pain  . Morphine Other (See Comments)    Generalized severe pain  . Paroxetine Other (See Comments)    GI upset  . Sertraline Other (See Comments)    GI upset  . Sulfamethoxazole-Trimethoprim Nausea Joan Vomiting  . Tape Itching, Dermatitis Joan Rash    Paper tape only.  . Clarithromycin Rash  . Doxycycline Rash Joan Other (See Comments)    GI upset  . Latex Itching Joan Rash  . Metoclopramide Anxiety  . Morphine Joan Related Other (See Comments)    REACTION: Causes pain  . Paroxetine Hcl Other (See Comments)    REACTION:  unknown    Patient Measurements: Height: 5\' 5"  (165.1 cm) Weight: 129 lb 3 oz (58.6 kg) IBW/kg (Calculated) : 57   Vital Signs: Temp: 98.4 F (36.9 C) (05/06 0548) Temp Source: Oral (05/06 0548) BP: 124/79 mmHg (05/06 0548) Intake/Output from previous day: 05/05 0701 - 05/06 0700 In: 1115.8 [P.O.:120; I.V.:795.8; IV Piggyback:200] Out: 400 [Urine:400] Intake/Output from this shift: Total I/O In: -  Out: 450 [Urine:450]  Labs:  Recent Labs  10/01/14 0250 10/01/14 0714 10/02/14 0710 10/03/14 0518  WBC  --  6.7 7.0 6.2  HGB  --  12.0 13.1 11.6*  PLT  --  38* 55* 46*  CREATININE 0.53  --  0.60 0.52   Estimated Creatinine Clearance: 74 mL/min (by C-G formula based on Cr of 0.52).  Recent Labs  10/03/14 0730  VANCOTROUGH 15     Microbiology: Recent Results (from the past 720 hour(s))  Blood Culture (routine x 2)     Status: None (Preliminary result)   Collection Time: 09/29/14 11:30 PM  Result Value Ref Range Status   Specimen Description BLOOD RIGHT FOREARM  Final   Special Requests BOTTLES DRAWN AEROBIC Joan ANAEROBIC 5CC  Final   Culture   Final           BLOOD CULTURE RECEIVED NO GROWTH TO DATE CULTURE WILL BE HELD FOR 5 DAYS BEFORE ISSUING A FINAL NEGATIVE REPORT Performed at [facility]    Report Status PENDING  Incomplete  Blood Culture (routine x 2)     Status: None (Preliminary result)   Collection Time: 09/30/14 12:08 AM  Result Value Ref Range Status   Specimen Description BLOOD RIGHT HAND  Final   Special Requests BOTTLES DRAWN AEROBIC ONLY 2CC  Final   Culture   Final           BLOOD CULTURE RECEIVED NO GROWTH TO DATE CULTURE WILL BE HELD FOR 5 DAYS BEFORE ISSUING A FINAL NEGATIVE REPORT Performed at [facility]    Report Status PENDING  Incomplete  Clostridium Difficile by PCR     Status: None   Collection Time: 09/30/14 12:37 AM  Result Value Ref Range Status   C difficile by pcr NEGATIVE NEGATIVE Final  Urine culture     Status: None   Collection Time: 09/30/14  1:04 AM  Result Value Ref Range  Status   Specimen Description URINE, CATHETERIZED  Final   Special Requests NONE  Final   Colony Count   Final    >=100,000 COLONIES/ML Performed at [facility]    Culture   Final    ENTEROCOCCUS SPECIES Performed at [facility]    Report Status 10/02/2014 FINAL  Final   Organism ID, Bacteria ENTEROCOCCUS SPECIES  Final      Susceptibility   Enterococcus species - MIC*    AMPICILLIN <=2 SENSITIVE Sensitive     LEVOFLOXACIN >=8 RESISTANT Resistant     NITROFURANTOIN 32 SENSITIVE Sensitive     VANCOMYCIN 2 SENSITIVE Sensitive     TETRACYCLINE >=16 RESISTANT Resistant     * ENTEROCOCCUS SPECIES  MRSA PCR Screening     Status: Abnormal   Collection Time: 09/30/14  5:38 AM  Result Value Ref Range Status   MRSA by PCR POSITIVE (A) NEGATIVE Final    Comment:        The GeneXpert  MRSA Assay (FDA approved for NASAL specimens only), is one component of a comprehensive MRSA colonization surveillance program. It is not intended to diagnose MRSA infection nor to guide or monitor treatment for MRSA infections. RESULT CALLED TO, READ BACK BY Joan VERIFIED WITH: E SMITH,RN AT 0845 09/30/14 BY K BARR     Assessment: 54 y.o. 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 Patient was on bactrim for UTI. Patient found to be hypotensive, encephalopathic with tremors.   Infectious Disease: Sepsis / Sacral decubuitis stage 4 - WBC wnl, PCT < 0.10, LA 4.15 > 2.2>1.9, Afeb. HIV neg. Enterococcal UTI. Fluconazole to cover for possible fungal topical infection. Renal function stable.  VT 5/6 15  5/2 Vanc >> 5/2 Azactam (PCN allergy) >> 5/4 5/3 Flagyl >>5/4 5/3 Fluconazole >>   BCx2 5/3 >>ngtd UC 5/3 >> Enterococcus - sensitive to amp,nitrofurantoin, vanc C diff neg  Goal of Therapy:  Vancomycin trough level 15-20 mcg/ml  Plan:  Continue vancomycin 750mg  q24 hours Recheck trough next week if to continue  Sheppard CoilFrank Kyrell Ruacho PharmD., BCPS Clinical Pharmacist Pager [phone] 10/03/2014 10:22 AM

## 2014-10-03 NOTE — Progress Notes (Signed)
Initial Nutrition Assessment  DOCUMENTATION CODES:  Not applicable  INTERVENTION:  Ensure Enlive (each supplement provides 350kcal and 20 grams of protein), MVI, Prostat  NUTRITION DIAGNOSIS:  Increased nutrient needs related to wound healing as evidenced by estimated needs.   GOAL:  Patient will meet greater than or equal to 90% of their needs   MONITOR:  PO intake, Supplement acceptance, Labs, Weight trends, Skin, I & O's  REASON FOR ASSESSMENT:  Low Braden    ASSESSMENT: [name] is a 54 y.o. 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 Patient's son states that last month patient had urine infection which was treated and again patient started having urinary infection 4 days ago which patient was started on antibiotics again. Over the last 4 days patient has become less communicative and confused. Patient was brought to the ER and initially was found to be hypotensive and patient was resuscitated with fluids follow with blood pressure improved. Lactic acid was elevated but has further worsened and another 2 L normal saline bolus at this time has been ordered by me. Patient was started on empiric antibiotics after blood cultures were obtained by the operation. In the ER patient had loose stools and blood work show anemia and thrombocytopenia. Stool for blood have been positive. As per the son patient did not have any nausea vomiting chest pain or shortness of breath. On exam patient has tremors and the rigid upper and lower extremities which patient's son states has been like this for last 2 years and has [facility] and patient presently is on prednisone. Patient also has skin changes in the groin area. As per patient's son patient has been largely bed bound for last 2 years.  Pt admitted  for AMS.  Pt unable to participate in interview due to AMS. Nutrition-focused physical exam deferred due to pt being cleaned or yelling in pain at times of visits. Per chart review, pt was recently receiving hospice services but was discharged. Past RD notes reveal that pt was on TF prior to July 2015. Suspect compromised nutritional status due to previous hospice status.  SLP notes reveal that pt is eating very little due to early satiety and mental status. Meal completion 10-15%. RD will add nutritional supplements due to increased needs for wounds and minimal PO intake.  Labs reviewed. Na: 146, Cl: 114, BUN <5, Calcium: 8.4, Glucose: 69.   Height:  Ht Readings from Last 1 Encounters:  09/30/14 5\' 5"  (1.651 m)    Weight:  Wt Readings from Last 1 Encounters:  10/03/14 129 lb 3 oz (58.6 kg)    Ideal Body Weight:  51.1 kg  Wt Readings from Last 10 Encounters:  10/03/14 129 lb 3 oz (58.6 kg)  12/04/13 164 lb 10.9 oz (74.7 kg)  08/27/13 153 lb 11.2 oz (69.718 kg)  07/30/13 181 lb 14.1 oz (82.5 kg)  07/24/13 170 lb 11.2 oz (77.429 kg)  07/16/13 182 lb 12.2 oz (82.9 kg)  05/17/13 154 lb 12.2 oz (70.2 kg)    BMI:  Body mass index is 21.5 kg/(m^2). Normal weight range  Estimated Nutritional Needs:  Kcal:  1750-1950  Protein:  70-80 grams  Fluid:  1.8-2.0 L  Skin:  Wound (see comment) (open incisions on rt and lt breasts, stage II wound on sacrum)  Diet Order:  DIET - DYS 1  Room service appropriate?: Yes; Fluid consistency:: Thin  EDUCATION NEEDS:  Education needs no appropriate at this time   Intake/Output Summary (Last 24 hours) at 10/03/14 1441 Last data filed at 10/03/14 0915  Gross per 24 hour  Intake    395 ml  Output   1350 ml  Net   -955 ml    Last BM:  10/03/14  [name] KnifeWilliams, RD, LDN, CDE Pager: [phone] After hours Pager: [phone]

## 2014-10-04 LAB — BASIC METABOLIC PANEL
Anion gap: 12 (ref 5–15)
BUN: <5 mg/dL — ABNORMAL LOW (ref 6–20)
CALCIUM: 8.5 mg/dL — AB (ref 8.9–10.3)
CO2: 20 mmol/L — ABNORMAL LOW (ref 22–32)
Chloride: 112 mmol/L — ABNORMAL HIGH (ref 101–111)
Creatinine, Ser: 0.58 mg/dL (ref 0.44–1.00)
GFR calc Af Amer: >60 mL/min (ref >60)
Glucose, Bld: 89 mg/dL (ref 70–99)
POTASSIUM: 3.9 mmol/L (ref 3.5–5.1)
SODIUM: 144 mmol/L (ref 135–145)

## 2014-10-04 LAB — CBC
HEMATOCRIT: 34.9 % — AB (ref 36.0–46.0)
Hemoglobin: 12.4 g/dL (ref 12.0–15.0)
MCH: 33 pg (ref 26.0–34.0)
MCHC: 35.5 g/dL (ref 30.0–36.0)
MCV: 92.8 fL (ref 78.0–100.0)
Platelets: 41 10*3/uL — ABNORMAL LOW (ref 150–400)
RBC: 3.76 MIL/uL — AB (ref 3.87–5.11)
RDW: 21 % — AB (ref 11.5–15.5)
WBC: 9.6 10*3/uL (ref 4.0–10.5)

## 2014-10-04 MED ORDER — FLUCONAZOLE 100 MG PO TABS
100.0000 mg | ORAL_TABLET | Freq: Every day | ORAL | Status: DC
  Administered 2014-10-04 – 2014-10-07 (×4): 100 mg via ORAL
  Filled 2014-10-04 (×4): qty 1

## 2014-10-04 NOTE — Progress Notes (Signed)
[facility] PROGRESS NOTE  [name] ZOX:[id number] DOB: 12/27/1960 DOA: 09/29/2014 PCP: Angela Coxasanayaka, [name] Y, MD  Summary 54 -year-old black female with history of bedbound state, autoimmune encephalitis, on chronic steroids being cared for at home by son, came to the emergency room with lethargy. Previously on hospice and DO NOT RESUSCITATE, but "graduated". Currently full code. Usually able to answer questions. Found to have a lactic acidosis and admitted to step down for fluid resuscitation, empiric antibiotics, stress dose steroids.   Assessment/Plan:  Principal Problem:   Acute encephalopathy: EEG without epileptiform activity.  CT brain UA and CXR ok.  Improved and likely at clinical baseline. Neurology consulted.    Sepsis syndrome: cx neg to date. C. difficile negative. On Diflucan and vancomycin for moisture associated skin damage with likely fungal and bacterial superinfection on buttocks and groin.  Stool for GI pathogen panel is pending.  Pressure associated skin damage with likely fungal and bacterial superinfection: Continue vanc and diflucan. Previously, had Foley catheter but it "fell out" at home according to son. Would leave in for now. Also has flexiseal  Diarrhea: C. difficile negative. GI pathogen panel pending. flexiseal with loose, watery yellow     Hypokalemia: resolved  Hypernatremia: changed to hypotonic fluid. Improved    autoimmunie Encephalitis per record review, [facility]. On chronic prednisone 60 mg.     Seizure disorder: on valproic acid. EEG with slowing only    Lactic acidosis resolved    Functional quadriplegia    Thrombocytopenia: secondary to acute illness. Anemia panel ok. Monitor  Code Status:  full Family Communication:  Son by phone 5/6 Disposition Plan:  Home when stable  Consultants:  PCCM  WOC  neurology  Procedures:     Antibiotics:  Vancomycin 5/3 --  Diflucan 5/3 --  Flagyl 5/3-5/4  Aztreonam  5/3-5/4  HPI/Subjective: Per nursing staff, eating better. Multiple loose stools  Objective: Filed Vitals:   10/04/14 0649  BP: 150/65  Pulse: 101  Temp: 98.6 F (37 C)  Resp: 20    Intake/Output Summary (Last 24 hours) at 10/04/14 1654 Last data filed at 10/04/14 1407  Gross per 24 hour  Intake   1710 ml  Output    400 ml  Net   1310 ml   Filed Weights   09/30/14 0530 10/03/14 0500 10/03/14 0600  Weight: 60.1 kg (132 lb 7.9 oz) 56.6 kg (124 lb 12.5 oz) 58.6 kg (129 lb 3 oz)    Exam:   General:  Asleep. Arousable. Tracks with eyes. Answers questions with one-word answers appropriately.  Cardiovascular: RRR without MGR  Respiratory: CTA without WRR  Abdomen: S, NT, ND  Ext: contractures legs and feet. Edema.  Skin:  desquamation induration medial thighs, groin and buttocks. Slightly less erythematous.  Rectal: Flexiseal with watery stool  Basic Metabolic Panel:  Recent Labs Lab 09/30/14 0830 10/01/14 0250 10/02/14 0710 10/03/14 0518 10/04/14 1122  NA 137 139 144 146* 144  K 3.3* 3.5 3.0* 3.7 3.9  CL 103 107 110 114* 112*  CO2 23 21* 24 25 20*  GLUCOSE 65* 74 82 69* 89  BUN 9 8 <5* <5* <5*  CREATININE 0.73 0.53 0.60 0.52 0.58  CALCIUM 7.3* 7.4* 8.5* 8.4* 8.5*  MG  --   --  1.6*  --   --    Liver Function Tests:  Recent Labs Lab 09/30/14 0010 09/30/14 0830  AST 65* 55*  ALT 28 23  ALKPHOS 66 49  BILITOT 1.4* 1.1  PROT 5.0*  4.4*  ALBUMIN 2.4* 2.0*   No results for input(s): LIPASE, AMYLASE in the last 168 hours.  Recent Labs Lab 09/30/14 0439  AMMONIA 29   CBC:  Recent Labs Lab 09/30/14 0145 09/30/14 0830 10/01/14 0714 10/02/14 0710 10/03/14 0518 10/04/14 0630  WBC 7.1 7.3 6.7 7.0 6.2 9.6  NEUTROABS 4.9  --  5.1  --   --   --   HGB 8.9* 12.9 12.0 13.1 11.6* 12.4  HCT 26.1* 37.7 34.6* 37.9 33.8* 34.9*  MCV 96.0 94.5 95.6 93.8 93.9 92.8  PLT 32* 41* 38* 55* 46* 41*   Cardiac Enzymes: No results for input(s): CKTOTAL, CKMB,  CKMBINDEX, TROPONINI in the last 168 hours. BNP (last 3 results) No results for input(s): BNP in the last 8760 hours.  ProBNP (last 3 results) No results for input(s): PROBNP in the last 8760 hours.  CBG: No results for input(s): GLUCAP in the last 168 hours.  Recent Results (from the past 240 hour(s))  Blood Culture (routine x 2)     Status: None (Preliminary result)   Collection Time: 09/29/14 11:30 PM  Result Value Ref Range Status   Specimen Description BLOOD RIGHT FOREARM  Final   Special Requests BOTTLES DRAWN AEROBIC AND ANAEROBIC 5CC  Final   Culture   Final           BLOOD CULTURE RECEIVED NO GROWTH TO DATE CULTURE WILL BE HELD FOR 5 DAYS BEFORE ISSUING A FINAL NEGATIVE REPORT Performed at [facility]    Report Status PENDING  Incomplete  Blood Culture (routine x 2)     Status: None (Preliminary result)   Collection Time: 09/30/14 12:08 AM  Result Value Ref Range Status   Specimen Description BLOOD RIGHT HAND  Final   Special Requests BOTTLES DRAWN AEROBIC ONLY 2CC  Final   Culture   Final           BLOOD CULTURE RECEIVED NO GROWTH TO DATE CULTURE WILL BE HELD FOR 5 DAYS BEFORE ISSUING A FINAL NEGATIVE REPORT Performed at [facility]    Report Status PENDING  Incomplete  Clostridium Difficile by PCR     Status: None   Collection Time: 09/30/14 12:37 AM  Result Value Ref Range Status   C difficile by pcr NEGATIVE NEGATIVE Final  Urine culture     Status: None   Collection Time: 09/30/14  1:04 AM  Result Value Ref Range Status   Specimen Description URINE, CATHETERIZED  Final   Special Requests NONE  Final   Colony Count   Final    >=100,000 COLONIES/ML Performed at [facility]    Culture   Final    ENTEROCOCCUS SPECIES Performed at [facility]    Report Status 10/02/2014 FINAL  Final   Organism ID, Bacteria ENTEROCOCCUS SPECIES  Final      Susceptibility   Enterococcus species - MIC*    AMPICILLIN <=2 SENSITIVE Sensitive      LEVOFLOXACIN >=8 RESISTANT Resistant     NITROFURANTOIN 32 SENSITIVE Sensitive     VANCOMYCIN 2 SENSITIVE Sensitive     TETRACYCLINE >=16 RESISTANT Resistant     * ENTEROCOCCUS SPECIES  MRSA PCR Screening     Status: Abnormal   Collection Time: 09/30/14  5:38 AM  Result Value Ref Range Status   MRSA by PCR POSITIVE (A) NEGATIVE Final    Comment:        The GeneXpert MRSA Assay (FDA approved for NASAL specimens only), is one  component of a comprehensive MRSA colonization surveillance program. It is not intended to diagnose MRSA infection nor to guide or monitor treatment for MRSA infections. RESULT CALLED TO, READ BACK BY AND VERIFIED WITH: E SMITH,RN AT 0845 09/30/14 BY K BARR      Studies: No results found.  Scheduled Meds: . clonazePAM  0.25 mg Oral QHS  . feeding supplement (ENSURE ENLIVE)  237 mL Oral BID BM  . feeding supplement (PRO-STAT SUGAR FREE 64)  30 mL Oral BID  . fluconazole  100 mg Oral Daily  . multivitamin with minerals  1 tablet Oral Daily  . mupirocin ointment  1 application Nasal BID  . predniSONE  60 mg Oral Daily  . Valproic Acid  750 mg Oral Q8H  . vancomycin  750 mg Intravenous Q24H   Continuous Infusions: . dextrose 5 % with KCl 20 mEq / L 20 mEq (10/04/14 0636)    Time spent: 15 minutes  Zanita Millman L  [facility] Pager [phone] [email or web address], password [email or web address] 10/04/2014, 4:54 PM  LOS: 4 days

## 2014-10-04 NOTE — Progress Notes (Signed)
Speech Language Pathology Treatment: Dysphagia  Patient Details Name: Joan [name] MRN: [id number] DOB: Aug 20, 1960 Today's Date: 10/04/2014 Time: 1412- 1421    Assessment / Plan / Recommendation Clinical Impression  Pt continues to accept very limited POs.  The little she is eating she appears to be tolerating with adequate airway protection - no overt s/s of aspiration.  Was willing to consume several sips of Ensure liquid upon presentation, but definitively refused solids with a clear, persistent "no." RN reports no difficulty tolerating meds crushed in puree, and that pt is amenable to taking them.  Recommend continuing current diet as ordered - dysphagia 1, thin liquids - little else SLP services can offer at this time given limited desire to eat, early satiety.  Will sign off.    HPI Other Pertinent Information: Joan [name] is a 54 y.o. 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  MD reports pt has sepsis of unclear etiology. As per patient's son patient has been largely bed bound for last 2 years. Pt had OP MBS on 11/29/13 showing moderate pharyngeal dysphagia with a delay in swallow, a severe oral dysphagia. Pt had multiple NG tube placed Recommended liquid diet, pt on hospice at the time.    Pertinent Vitals    SLP Plan  All goals met    Recommendations Diet recommendations: Dysphagia 1 (puree);Thin liquid Liquids provided via: Cup;Straw Medication Administration: Crushed with puree Supervision: Full supervision/cueing for compensatory strategies Compensations: Slow rate;Small sips/bites Postural Changes and/or Swallow Maneuvers: Seated upright 90 degrees              Oral Care Recommendations: Oral care BID Follow up Recommendations: None Plan: All goals met   Niurka Benecke L. Tivis Ringer, [place] CCC/SLP Pager [phone]      Joan [name] 10/04/2014, 2:21 PM

## 2014-10-05 DIAGNOSIS — E876 Hypokalemia: Secondary | ICD-10-CM

## 2014-10-05 DIAGNOSIS — G9340 Encephalopathy, unspecified: Secondary | ICD-10-CM

## 2014-10-05 DIAGNOSIS — R532 Functional quadriplegia: Secondary | ICD-10-CM

## 2014-10-05 DIAGNOSIS — R197 Diarrhea, unspecified: Secondary | ICD-10-CM

## 2014-10-05 LAB — CBC
HCT: 31.6 % — ABNORMAL LOW (ref 36.0–46.0)
Hemoglobin: 10.9 g/dL — ABNORMAL LOW (ref 12.0–15.0)
MCH: 32.6 pg (ref 26.0–34.0)
MCHC: 34.5 g/dL (ref 30.0–36.0)
MCV: 94.6 fL (ref 78.0–100.0)
PLATELETS: 51 10*3/uL — AB (ref 150–400)
RBC: 3.34 MIL/uL — ABNORMAL LOW (ref 3.87–5.11)
RDW: 21.1 % — AB (ref 11.5–15.5)
WBC: 6.7 10*3/uL (ref 4.0–10.5)

## 2014-10-05 LAB — BASIC METABOLIC PANEL
Anion gap: 7 (ref 5–15)
CHLORIDE: 106 mmol/L (ref 101–111)
CO2: 28 mmol/L (ref 22–32)
Calcium: 7.8 mg/dL — ABNORMAL LOW (ref 8.9–10.3)
Creatinine, Ser: 0.47 mg/dL (ref 0.44–1.00)
Glucose, Bld: 77 mg/dL (ref 70–99)
Potassium: 2.8 mmol/L — ABNORMAL LOW (ref 3.5–5.1)
Sodium: 141 mmol/L (ref 135–145)

## 2014-10-05 LAB — GLUCOSE, CAPILLARY: GLUCOSE-CAPILLARY: 85 mg/dL (ref 70–99)

## 2014-10-05 MED ORDER — MAGNESIUM SULFATE 2 GM/50ML IV SOLN
2.0000 g | Freq: Once | INTRAVENOUS | Status: AC
  Administered 2014-10-05: 2 g via INTRAVENOUS
  Filled 2014-10-05: qty 50

## 2014-10-05 MED ORDER — POTASSIUM CHLORIDE 10 MEQ/100ML IV SOLN
10.0000 meq | INTRAVENOUS | Status: AC
  Administered 2014-10-05 (×5): 10 meq via INTRAVENOUS
  Filled 2014-10-05 (×5): qty 100

## 2014-10-05 NOTE — Progress Notes (Addendum)
[facility] PROGRESS NOTE  [name] ZOX:[id number] DOB: 07/19/1960 DOA: 09/29/2014 PCP: Angela Coxasanayaka, [name] Y, MD  Summary 54 -year-old black female with history of bedbound state, autoimmune encephalitis, on chronic steroids being cared for at home by son, came to the emergency room with lethargy. Previously on hospice and DO NOT RESUSCITATE, but "graduated". Currently full code. Usually able to answer questions. Found to have a lactic acidosis and admitted to step down for fluid resuscitation, empiric antibiotics, stress dose steroids.   Assessment/Plan:    Acute encephalopathy: EEG without epileptiform activity.  CT brain UA and CXR ok.  Improved and likely at clinical baseline. Neurology consulted.    Sepsis syndrome: cx neg to date. C. difficile negative. On Diflucan and vancomycin for moisture associated skin damage with likely fungal and bacterial superinfection on buttocks and groin.  Stool for GI pathogen panel is pending.  Pressure associated skin damage with likely fungal and bacterial superinfection: Continue vanc and diflucan. Previously, had Foley catheter but it "fell out" at home according to son. Would leave in for now. Also has flexiseal  Diarrhea: C. difficile negative. GI pathogen panel pending. flexiseal with loose, watery yellow   Enterococcus UTI -had foley,? colonization -on vanc   Hypokalemia: replete ?re-feeding syndrome?  hypomagnesium -replete  Hypernatremia: changed to hypotonic fluid. Improved    autoimmunie Encephalitis per record review, [facility]. On chronic prednisone 60 mg.     Seizure disorder: on valproic acid. EEG with slowing only    Lactic acidosis resolved    Functional quadriplegia    Thrombocytopenia: secondary to acute illness. Anemia panel ok. Monitor  Code Status:  full Family Communication:  Called son Disposition Plan:  ? If son can care for patient at home?  Consultants:  PCCM  WOC  neurology  Procedures:      Antibiotics:  Vancomycin 5/3 --  Diflucan 5/3 --  Flagyl 5/3-5/4  Aztreonam 5/3-5/4  HPI/Subjective: Does not talk but is drinking per nursing  Objective: Filed Vitals:   10/05/14 0538  BP: 125/72  Pulse: 93  Temp: 98.6 F (37 C)  Resp: 18    Intake/Output Summary (Last 24 hours) at 10/05/14 0927 Last data filed at 10/05/14 0600  Gross per 24 hour  Intake   1230 ml  Output   2020 ml  Net   -790 ml   Filed Weights   09/30/14 0530 10/03/14 0500 10/03/14 0600  Weight: 60.1 kg (132 lb 7.9 oz) 56.6 kg (124 lb 12.5 oz) 58.6 kg (129 lb 3 oz)    Exam:   General:  Awake, not answering questions  Cardiovascular: RRR without MGR  Respiratory: CTA without WRR  Abdomen: S, NT, ND  Ext: contractures legs and feet. Edema.  Skin:  desquamation induration medial thighs, groin and buttocks  Rectal: Flexiseal with greenish color loose stool  Basic Metabolic Panel:  Recent Labs Lab 10/01/14 0250 10/02/14 0710 10/03/14 0518 10/04/14 1122 10/05/14 0610  NA 139 144 146* 144 141  K 3.5 3.0* 3.7 3.9 2.8*  CL 107 110 114* 112* 106  CO2 21* 24 25 20* 28  GLUCOSE 74 82 69* 89 77  BUN 8 <5* <5* <5* <5*  CREATININE 0.53 0.60 0.52 0.58 0.47  CALCIUM 7.4* 8.5* 8.4* 8.5* 7.8*  MG  --  1.6*  --   --   --    Liver Function Tests:  Recent Labs Lab 09/30/14 0010 09/30/14 0830  AST 65* 55*  ALT 28 23  ALKPHOS 66 49  BILITOT  1.4* 1.1  PROT 5.0* 4.4*  ALBUMIN 2.4* 2.0*   No results for input(s): LIPASE, AMYLASE in the last 168 hours.  Recent Labs Lab 09/30/14 0439  AMMONIA 29   CBC:  Recent Labs Lab 09/30/14 0145  10/01/14 0714 10/02/14 0710 10/03/14 0518 10/04/14 0630 10/05/14 0610  WBC 7.1  < > 6.7 7.0 6.2 9.6 6.7  NEUTROABS 4.9  --  5.1  --   --   --   --   HGB 8.9*  < > 12.0 13.1 11.6* 12.4 10.9*  HCT 26.1*  < > 34.6* 37.9 33.8* 34.9* 31.6*  MCV 96.0  < > 95.6 93.8 93.9 92.8 94.6  PLT 32*  < > 38* 55* 46* 41* 51*  < > = values in this  interval not displayed. Cardiac Enzymes: No results for input(s): CKTOTAL, CKMB, CKMBINDEX, TROPONINI in the last 168 hours. BNP (last 3 results) No results for input(s): BNP in the last 8760 hours.  ProBNP (last 3 results) No results for input(s): PROBNP in the last 8760 hours.  CBG: No results for input(s): GLUCAP in the last 168 hours.  Recent Results (from the past 240 hour(s))  Blood Culture (routine x 2)     Status: None (Preliminary result)   Collection Time: 09/29/14 11:30 PM  Result Value Ref Range Status   Specimen Description BLOOD RIGHT FOREARM  Final   Special Requests BOTTLES DRAWN AEROBIC AND ANAEROBIC 5CC  Final   Culture   Final           BLOOD CULTURE RECEIVED NO GROWTH TO DATE CULTURE WILL BE HELD FOR 5 DAYS BEFORE ISSUING A FINAL NEGATIVE REPORT Performed at [facility]    Report Status PENDING  Incomplete  Blood Culture (routine x 2)     Status: None (Preliminary result)   Collection Time: 09/30/14 12:08 AM  Result Value Ref Range Status   Specimen Description BLOOD RIGHT HAND  Final   Special Requests BOTTLES DRAWN AEROBIC ONLY 2CC  Final   Culture   Final           BLOOD CULTURE RECEIVED NO GROWTH TO DATE CULTURE WILL BE HELD FOR 5 DAYS BEFORE ISSUING A FINAL NEGATIVE REPORT Performed at [facility]    Report Status PENDING  Incomplete  Clostridium Difficile by PCR     Status: None   Collection Time: 09/30/14 12:37 AM  Result Value Ref Range Status   C difficile by pcr NEGATIVE NEGATIVE Final  Urine culture     Status: None   Collection Time: 09/30/14  1:04 AM  Result Value Ref Range Status   Specimen Description URINE, CATHETERIZED  Final   Special Requests NONE  Final   Colony Count   Final    >=100,000 COLONIES/ML Performed at [facility]    Culture   Final    ENTEROCOCCUS SPECIES Performed at [facility]    Report Status 10/02/2014 FINAL  Final   Organism ID, Bacteria ENTEROCOCCUS SPECIES  Final       Susceptibility   Enterococcus species - MIC*    AMPICILLIN <=2 SENSITIVE Sensitive     LEVOFLOXACIN >=8 RESISTANT Resistant     NITROFURANTOIN 32 SENSITIVE Sensitive     VANCOMYCIN 2 SENSITIVE Sensitive     TETRACYCLINE >=16 RESISTANT Resistant     * ENTEROCOCCUS SPECIES  MRSA PCR Screening     Status: Abnormal   Collection Time: 09/30/14  5:38 AM  Result Value Ref Range Status  MRSA by PCR POSITIVE (A) NEGATIVE Final    Comment:        The GeneXpert MRSA Assay (FDA approved for NASAL specimens only), is one component of a comprehensive MRSA colonization surveillance program. It is not intended to diagnose MRSA infection nor to guide or monitor treatment for MRSA infections. RESULT CALLED TO, READ BACK BY AND VERIFIED WITH: E SMITH,RN AT 0845 09/30/14 BY K BARR      Studies: No results found.  Scheduled Meds: . clonazePAM  0.25 mg Oral QHS  . feeding supplement (ENSURE ENLIVE)  237 mL Oral BID BM  . feeding supplement (PRO-STAT SUGAR FREE 64)  30 mL Oral BID  . fluconazole  100 mg Oral Daily  . multivitamin with minerals  1 tablet Oral Daily  . potassium chloride  10 mEq Intravenous Q1 Hr x 5  . predniSONE  60 mg Oral Daily  . Valproic Acid  750 mg Oral Q8H  . vancomycin  750 mg Intravenous Q24H   Continuous Infusions:    Time spent: 15 minutes  Benjamine MolaVANN, JESSICA  [facility] Pager [phone] [email or web address], password [email or web address] 10/05/2014, 9:27 AM  LOS: 5 days

## 2014-10-05 NOTE — Plan of Care (Signed)
Problem: Phase II Progression Outcomes Goal: Progress activity as tolerated unless otherwise ordered Outcome: Not Met (add Reason) Pt is bedrest

## 2014-10-05 NOTE — Progress Notes (Signed)
Patient with poor appetite; she is drinking fluids but she is refusing all her meals. For 1800 o'clock she refused to take her medication. Will continue to monitor.

## 2014-10-06 LAB — CULTURE, BLOOD (ROUTINE X 2)
Culture: NO GROWTH
Culture: NO GROWTH

## 2014-10-06 LAB — BASIC METABOLIC PANEL
Anion gap: 7 (ref 5–15)
BUN: <5 mg/dL — ABNORMAL LOW (ref 6–20)
CO2: 26 mmol/L (ref 22–32)
Calcium: 7.9 mg/dL — ABNORMAL LOW (ref 8.9–10.3)
Chloride: 107 mmol/L (ref 101–111)
Creatinine, Ser: 0.44 mg/dL (ref 0.44–1.00)
GFR calc Af Amer: >60 mL/min (ref >60)
GFR calc non Af Amer: >60 mL/min (ref >60)
Glucose, Bld: 57 mg/dL — ABNORMAL LOW (ref 70–99)
Potassium: 5.1 mmol/L (ref 3.5–5.1)
Sodium: 140 mmol/L (ref 135–145)

## 2014-10-06 NOTE — Progress Notes (Signed)
[facility] PROGRESS NOTE  Joan [name] ZOX:[id number] DOB: 10-30-1960 DOA: 09/29/2014 PCP: [name], MD  Summary 54 -year-old black female with history of bedbound state, autoimmune encephalitis, on chronic steroids being cared for at home by son, came to the emergency room with lethargy. Previously on hospice and DO NOT RESUSCITATE, but "graduated". Currently full code. Usually able to answer questions. Found to have a lactic acidosis and admitted to step down for fluid resuscitation, empiric antibiotics, stress dose steroids.   Assessment/Plan:    Acute encephalopathy: EEG without epileptiform activity.  CT brain UA and CXR ok.  Improved and likely at clinical baseline. Neurology consulted.    Sepsis syndrome: cx neg to date. C. difficile negative. On Diflucan and vancomycin for moisture associated skin damage with likely fungal and bacterial superinfection on buttocks and groin.  Stool for GI pathogen panel is pending.  Pressure associated skin damage with likely fungal and bacterial superinfection: Continue vanc and diflucan. Previously, had Foley catheter but it "fell out" at home according to son. Would leave in for now. Also has flexiseal Wound care: Interdry silver-impregnated fabric to provide antimicrobial benefits and wick drainage away from skin to inner perineum and groin. This should remain in place for 5 days for optimal plan of care. Instructions provided for staff nurse use. Barrier cream and antifungal powder to buttocks to protect and repel moisture.  Diarrhea: C. difficile negative. GI pathogen panel pending. flexiseal with loose, watery yellow- per son, has had for a while   Enterococcus UTI -had foley,? colonization -on vanc    Hypokalemia: replete ?re-feeding syndrome?  hypomagnesium -replete  Hypernatremia: changed to hypotonic fluid. Improved    autoimmunie Encephalitis per record review, [facility]. On chronic prednisone 60 mg.     Seizure  disorder: on valproic acid. EEG with slowing only    Lactic acidosis resolved    Functional quadriplegia    Thrombocytopenia: secondary to acute illness. Anemia panel ok. Monitor  Code Status:  full Family Communication:  Called son able to speak with him- patient seems to be back at baseline- has loose stool at home and patient is much more responsive to him than staff here Disposition Plan:  ? If son can care for patient at home?- refusing hospice/SNF  Consultants:  PCCM  WOC  neurology   Antibiotics:  Vancomycin 5/3 --  Diflucan 5/3 --  Flagyl 5/3-5/4  Aztreonam 5/3-5/4  HPI/Subjective: Taking in minimal amounts of food/liquid  Objective: Filed Vitals:   10/06/14 0521  BP: 134/57  Pulse: 95  Temp: 98.1 F (36.7 C)  Resp: 18    Intake/Output Summary (Last 24 hours) at 10/06/14 1135 Last data filed at 10/06/14 0906  Gross per 24 hour  Intake    320 ml  Output   1380 ml  Net  -1060 ml   Filed Weights   09/30/14 0530 10/03/14 0500 10/03/14 0600  Weight: 60.1 kg (132 lb 7.9 oz) 56.6 kg (124 lb 12.5 oz) 58.6 kg (129 lb 3 oz)    Exam:   General:  Awake, not answering questions  Cardiovascular: RRR without MGR  Respiratory: CTA without WRR  Abdomen: S, NT, ND  Ext: contractures legs and feet. Edema.  Skin:  desquamation induration medial thighs, groin and buttocks  Rectal: Flexiseal with yellow color loose stool  Basic Metabolic Panel:  Recent Labs Lab 10/02/14 0710 10/03/14 0518 10/04/14 1122 10/05/14 0610 10/06/14 0725  NA 144 146* 144 141 140  K 3.0* 3.7 3.9 2.8* 5.1  CL 110 114* 112* 106 107  CO2 24 25 20* 28 26  GLUCOSE 82 69* 89 77 57*  BUN <5* <5* <5* <5* <5*  CREATININE 0.60 0.52 0.58 0.47 0.44  CALCIUM 8.5* 8.4* 8.5* 7.8* 7.9*  MG 1.6*  --   --   --   --    Liver Function Tests:  Recent Labs Lab 09/30/14 0010 09/30/14 0830  AST 65* 55*  ALT 28 23  ALKPHOS 66 49  BILITOT 1.4* 1.1  PROT 5.0* 4.4*  ALBUMIN 2.4*  2.0*   No results for input(s): LIPASE, AMYLASE in the last 168 hours.  Recent Labs Lab 09/30/14 0439  AMMONIA 29   CBC:  Recent Labs Lab 09/30/14 0145  10/01/14 0714 10/02/14 0710 10/03/14 0518 10/04/14 0630 10/05/14 0610  WBC 7.1  < > 6.7 7.0 6.2 9.6 6.7  NEUTROABS 4.9  --  5.1  --   --   --   --   HGB 8.9*  < > 12.0 13.1 11.6* 12.4 10.9*  HCT 26.1*  < > 34.6* 37.9 33.8* 34.9* 31.6*  MCV 96.0  < > 95.6 93.8 93.9 92.8 94.6  PLT 32*  < > 38* 55* 46* 41* 51*  < > = values in this interval not displayed. Cardiac Enzymes: No results for input(s): CKTOTAL, CKMB, CKMBINDEX, TROPONINI in the last 168 hours. BNP (last 3 results) No results for input(s): BNP in the last 8760 hours.  ProBNP (last 3 results) No results for input(s): PROBNP in the last 8760 hours.  CBG:  Recent Labs Lab 10/05/14 1853  GLUCAP 85    Recent Results (from the past 240 hour(s))  Blood Culture (routine x 2)     Status: None   Collection Time: 09/29/14 11:30 PM  Result Value Ref Range Status   Specimen Description BLOOD RIGHT FOREARM  Final   Special Requests BOTTLES DRAWN AEROBIC AND ANAEROBIC 5CC  Final   Culture   Final    NO GROWTH 5 DAYS [facility]    Report Status 10/06/2014 FINAL  Final  Blood Culture (routine x 2)     Status: None   Collection Time: 09/30/14 12:08 AM  Result Value Ref Range Status   Specimen Description BLOOD RIGHT HAND  Final   Special Requests BOTTLES DRAWN AEROBIC ONLY 2CC  Final   Culture   Final    NO GROWTH 5 DAYS Performed at [facility]    Report Status 10/06/2014 FINAL  Final  Clostridium Difficile by PCR     Status: None   Collection Time: 09/30/14 12:37 AM  Result Value Ref Range Status   C difficile by pcr NEGATIVE NEGATIVE Final  Urine culture     Status: None   Collection Time: 09/30/14  1:04 AM  Result Value Ref Range Status   Specimen Description URINE, CATHETERIZED  Final   Special Requests NONE  Final    Colony Count   Final    >=100,000 COLONIES/ML [facility]formed at [facility]    Culture   Final    ENTEROCOCCUS SPECIES Performed at [facility]    Report Status 10/02/2014 FINAL  Final   Organism ID, Bacteria ENTEROCOCCUS SPECIES  Final      Susceptibility   Enterococcus species - MIC*    AMPICILLIN <=2 SENSITIVE Sensitive     LEVOFLOXACIN >=8 RESISTANT Resistant     NITROFURANTOIN 32 SENSITIVE Sensitive     VANCOMYCIN 2 SENSITIVE Sensitive  TETRACYCLINE >=16 RESISTANT Resistant     * ENTEROCOCCUS SPECIES  MRSA PCR Screening     Status: Abnormal   Collection Time: 09/30/14  5:38 AM  Result Value Ref Range Status   MRSA by PCR POSITIVE (A) NEGATIVE Final    Comment:        The GeneXpert MRSA Assay (FDA approved for NASAL specimens only), is one component of a comprehensive MRSA colonization surveillance program. It is not intended to diagnose MRSA infection nor to guide or monitor treatment for MRSA infections. RESULT CALLED TO, READ BACK BY AND VERIFIED WITH: E SMITH,RN AT 0845 09/30/14 BY K BARR      Studies: No results found.  Scheduled Meds: . clonazePAM  0.25 mg Oral QHS  . feeding supplement (ENSURE ENLIVE)  237 mL Oral BID BM  . feeding supplement (PRO-STAT SUGAR FREE 64)  30 mL Oral BID  . fluconazole  100 mg Oral Daily  . multivitamin with minerals  1 tablet Oral Daily  . predniSONE  60 mg Oral Daily  . Valproic Acid  750 mg Oral Q8H  . vancomycin  750 mg Intravenous Q24H   Continuous Infusions:    Time spent: 15 minutes  Benjamine MolaVANN, Joan [name]  [facility] Pager [phone] [email or web address], password [facility] 10/06/2014, 11:35 AM  LOS: 6 days

## 2014-10-06 NOTE — Care Management Note (Signed)
Case Management Note  Patient Details  Name: Joan [name] MRN: [id number] Date of Birth: 07/12/1960  Subjective/Objective:        NCM spoke with [name], [facility] and [facility],  [facility], [facility].  Soc will begin 24-48 hrs post dc.  Plan is for dc tomorrow, patient will need ambulance transport home around 3:30 pm. CSW referral.            Action[facility]Plan[facility]  [facility] Expected Discharge Date:                  Expected Discharge Plan:  Home w Home Health Services  In-House Referral:  Clinical Social Work  Discharge planning Services  CM Consult  Post Acute Care Choice:    Choice offered to:  Sibling  DME Arranged:    DME Agency:     HH Arranged:  RN, Nurse's Aide, Speech Therapy HH Agency:  [facility]  Status of Service:  In process, will continue to follow  Medicare Important Message Given:  Yes Date Medicare IM Given:  10/06/14 Medicare IM give by:  Letha Capeeborah Edwen Mclester RN, Date Additional Medicare IM Given:    Additional Medicare Important Message give by:     If discussed at Long Length of Stay Meetings, dates discussed:    Additional Comments:  Leone Havenaylor, Latunya Kissick [place], RN 10/06/2014, 4:16 PM

## 2014-10-06 NOTE — Progress Notes (Signed)
ANTIBIOTIC CONSULT NOTE - FOLLOW UP  Pharmacy Consult for vancomycin  Indication: rule out sepsis/sacral decubs  Allergies  Allergen Reactions  . Penicillins Other (See Comments), Rash and Shortness Of Breath    REACTION: Makes her body hurt.  . Citalopram Nausea And Vomiting  . Clindamycin Rash and Other (See Comments)    GI upset  . Duloxetine Hcl Other (See Comments)    GI upset   . Escitalopram Nausea And Vomiting  . Hydromorphone Other (See Comments)    Generalized severe pain  . Morphine Other (See Comments)    Generalized severe pain  . Paroxetine Other (See Comments)    GI upset  . Sertraline Other (See Comments)    GI upset  . Sulfamethoxazole-Trimethoprim Nausea And Vomiting  . Tape Itching, Dermatitis and Rash    Paper tape only.  . Clarithromycin Rash  . Doxycycline Rash and Other (See Comments)    GI upset  . Latex Itching and Rash  . Metoclopramide Anxiety  . Morphine And Related Other (See Comments)    REACTION: Causes pain  . Paroxetine Hcl Other (See Comments)    REACTION:  unknown    Patient Measurements: Height: 5\' 5"  (165.1 cm) Weight: 129 lb 3 oz (58.6 kg) IBW/kg (Calculated) : 57   Vital Signs: Temp: 98.1 F (36.7 C) (05/09 0521) Temp Source: Oral (05/09 0521) BP: 134/57 mmHg (05/09 0521) Pulse Rate: 95 (05/09 0521) Intake/Output from previous day: 05/08 0701 - 05/09 0700 In: 470 [P.O.:420; I.V.:50] Out: 1380 [Urine:380; Stool:1000] Intake/Output from this shift: Total I/O In: 100 [P.O.:100] Out: -   Labs:  Recent Labs  10/04/14 0630 10/04/14 1122 10/05/14 0610 10/06/14 0725  WBC 9.6  --  6.7  --   HGB 12.4  --  10.9*  --   PLT 41*  --  51*  --   CREATININE  --  0.58 0.47 0.44   Estimated Creatinine Clearance: 74 mL/min (by C-G formula based on Cr of 0.44). No results for input(s): VANCOTROUGH, VANCOPEAK, VANCORANDOM, GENTTROUGH, GENTPEAK, GENTRANDOM, TOBRATROUGH, TOBRAPEAK, TOBRARND, AMIKACINPEAK, AMIKACINTROU, AMIKACIN in  the last 72 hours.   Microbiology: BCx2 5/3 >>ngtd UC 5/3 >> Enterococcus - sensitive to amp,nitrofurantoin, vanc C diff neg  Assessment: 54 y.o. Bed-bound functional quadriplegic 2/2 to strokes, and seizures admitted on 09/29/2014 with [facility]. Patient was on bactrim for UTI. Patient found to be hypotensive, encephalopathic with tremors.   Infectious Disease: Sepsis / Sacral decubuitis stage 4 - WBC wnl, PCT < 0.10, LA 4.15 > 2.2>1.9, Afeb. HIV neg. Enterococcal UTI. Fluconazole to cover for possible fungal topical infection. Renal function stable.  VT 5/6 15 - at goal  5/2 Vanc >> 5/2 Azactam (PCN allergy) >> 5/4 5/3 Flagyl >>5/4 5/3 Fluconazole >> (oral)  Goal of Therapy:  Vancomycin trough level 15-20 mcg/ml  Plan:  Continue vancomycin 750mg  q24 hours Recheck trough next week if to continue  Link SnufferJessica Burch [place], PharmD, BCPS Clinical Pharmacist [phone] 10/06/2014 12:44 PM

## 2014-10-06 NOTE — Clinical Social Work Note (Signed)
Clinical Social Worker received referral for possible ST-SNF placement.  Chart reviewed.  Per MD, family refusing placement and request home health.  Spoke with RN Case Manager who has already arranged patient home health needs including a Child psychotherapistsocial worker.   CSW signing off - please re consult if social work needs arise.  Joan [name], [place] [phone]

## 2014-10-07 DIAGNOSIS — G0490 Encephalitis and encephalomyelitis, unspecified: Secondary | ICD-10-CM

## 2014-10-07 LAB — HAPTOGLOBIN: Haptoglobin: 138 mg/dL (ref 34–200)

## 2014-10-07 LAB — GI PATHOGEN PANEL BY PCR, STOOL
C difficile toxin A/B: NOT DETECTED
CRYPTOSPORIDIUM BY PCR: NOT DETECTED
Campylobacter by PCR: NOT DETECTED
E COLI 0157 BY PCR: NOT DETECTED
E coli (ETEC) LT/ST: NOT DETECTED
E coli (STEC): NOT DETECTED
G LAMBLIA BY PCR: NOT DETECTED
Norovirus GI/GII: NOT DETECTED
Rotavirus A by PCR: NOT DETECTED
SALMONELLA BY PCR: NOT DETECTED
SHIGELLA BY PCR: NOT DETECTED

## 2014-10-07 MED ORDER — FLUCONAZOLE 100 MG PO TABS
100.0000 mg | ORAL_TABLET | Freq: Every day | ORAL | Status: DC
Start: 2014-10-07 — End: 2014-10-31

## 2014-10-07 MED ORDER — LOPERAMIDE HCL 1 MG/5ML PO LIQD: 1.0000 mg | ORAL | Status: DC | PRN

## 2014-10-07 MED ORDER — LOPERAMIDE HCL 1 MG/5ML PO LIQD
1.0000 mg | ORAL | Status: DC | PRN
  Filled 2014-10-07: qty 5

## 2014-10-07 MED ORDER — ADULT MULTIVITAMIN W/MINERALS CH: 1.0000 | ORAL_TABLET | Freq: Every day | ORAL | Status: DC

## 2014-10-07 MED ORDER — PRO-STAT SUGAR FREE PO LIQD: 30.0000 mL | Freq: Two times a day (BID) | ORAL | Status: DC

## 2014-10-07 MED ORDER — CLONAZEPAM 0.5 MG PO TABS: 0.2500 mg | ORAL_TABLET | Freq: Every day | ORAL | Status: DC

## 2014-10-07 NOTE — Progress Notes (Signed)
Candida PeelingLora Saiz to be D/C'd Home per MD order.  Discussed with the patient's son and all questions fully answered.    An After Visit Summary was printed and given to EMS.   D/c education completed with patient/family including follow up instructions, medication list, d/c activities limitations if indicated, with other d/c instructions as indicated by MD - patient's son able to verbalize understanding, all questions fully answered.     Patient escorted via stretcher , and D/C home via EMS.  Beckey DowningFlores, Amberle Lyter F 10/07/2014 4:18 PM

## 2014-10-07 NOTE — Progress Notes (Signed)
Discharge information given to [name] (pt's son) over the phone. All questions answered at this time. Yellow Packet ready for EMS.

## 2014-10-07 NOTE — Progress Notes (Signed)
Medicare Important Message given?  YES (If response is "NO", the following Medicare IM given date fields will be blank) Date Medicare IM given:  10/07/14 Medicare IM given by:  Haila Dena 

## 2014-10-07 NOTE — Care Management Note (Signed)
Case Management Note  Patient Details  Name: Joan PeelingLora Lattin MRN: [id number] Date of Birth: 04/14/1961  Subjective/Objective:              Patient is for dc today, patient was active with [facility], but son states he wants to go with another agency, [facility], NCM informed [facility] of this information, they state they would discharge patient from their services, and [facility] also notified so they can pick up this patient.  Patient will go by ambulance transport today.  CSW aware.       Action/Plan:   Expected Discharge Date:                  Expected Discharge Plan:  Home w Home Health Services  In-House Referral:  Clinical Social Work  Discharge planning Services  CM Consult  Post Acute Care Choice:    Choice offered to:  Sibling  DME Arranged:    DME Agency:     HH Arranged:  RN, Nurse's Aide, Speech Therapy HH Agency:  [facility]  Status of Service:  Completed, signed off  Medicare Important Message Given:  Yes Date Medicare IM Given:  10/06/14 Medicare IM give by:  Letha Capeeborah Jacole Capley RN, Date Additional Medicare IM Given:    Additional Medicare Important Message give by:     If discussed at Long Length of Stay Meetings, dates discussed:    Additional Comments:  Leone Havenaylor, Lonnette Shrode [place], RN 10/07/2014, 11:14 AM

## 2014-10-07 NOTE — Progress Notes (Signed)
Utilization review completed.  

## 2014-10-07 NOTE — Discharge Summary (Signed)
Physician Discharge Summary  [name] ZOX:[id number] DOB: 12/17/1960 DOA: 09/29/2014  PCP: Joan Cox, MD  Admit date: 09/29/2014 Discharge date: 10/07/2014  Time spent: 35 minutes  Recommendations for Outpatient Follow-up:  1. 24 hour supervision by family 2. Home health- suspect patient's family is not helping at home much- may need APS 3. Discussed hospice/pallaitive care- son not interested at this moment 4. Follow up GI pathogen panel  Discharge Diagnoses:  Principal Problem:   Acute encephalopathy Active Problems:   Sepsis   AKI (acute kidney injury)   Hypokalemia   Hypomagnesemia   Diarrhea   Seizures   Lactic acidosis   autoimmunie Encephalitis   Functional quadriplegia   Thrombocytopenia   Intertrigo/moisture assosicated skin damage with possible superinfection   Discharge Condition: stable  Diet recommendation: DYS  Filed Weights   09/30/14 0530 10/03/14 0500 10/03/14 0600  Weight: 60.1 kg (132 lb 7.9 oz) 56.6 kg (124 lb 12.5 oz) 58.6 kg (129 lb 3 oz)    History of present illness:  Joan [name] is a 54 y.o. 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 Patient's son states that last month patient had urine infection which was treated and again patient started having urinary infection 4 days ago which patient was started on antibiotics again. Over the last 4 days patient has become less communicative and confused. Patient was brought to the ER and initially was found to be hypotensive and patient was resuscitated with fluids follow with blood pressure improved. Lactic acid was elevated but has further worsened and another 2 L normal saline bolus at this time has been ordered by me. Patient was started on empiric antibiotics after blood cultures were obtained by the operation. In the ER patient had loose stools  and blood work show anemia and thrombocytopenia. Stool for blood have been positive. As per the son patient did not have any nausea vomiting chest pain or shortness of breath. On exam patient has tremors and the rigid upper and lower extremities which patient's son states has been like this for last 2 years and has been followed up with neurologist at [facility] and patient presently is on prednisone. Patient also has skin changes in the groin area. As per patient's son patient has been largely bed bound for last 2 years.   Hospital Course:  Acute encephalopathy: EEG without epileptiform activity. CT brain UA and CXR ok. Improved and likely at clinical baseline. Neurology consulted.   Sepsis syndrome: cx neg to date. C. difficile negative. s/p Diflucan and vancomycin for moisture associated skin damage with likely fungal and bacterial superinfection on buttocks and groin.  Mositure associated skin damage with likely fungal and bacterial superinfection: s/p vanc and diflucan. Previously, had Foley catheter but it "fell out" at home according to son. D/c with foley Wound care: Interdry silver-impregnated fabric to provide antimicrobial benefits and wick drainage away from skin to inner perineum and groin. This should remain in place for 5 days for optimal plan of care.  Barrier cream to buttocks to protect and repel moisture.  Diarrhea: C. difficile negative. GI pathogen panel pending. flexiseal with loose, watery yellow- per son, has had for a while  Enterococcus UTI -had foley,? colonization -on vanc   Hypokalemia: replete ?re-feeding syndrome?  hypomagnesium -replete  Hypernatremia: stable   autoimmunie Encephalitis per record review, [facility]. On chronic prednisone 60 mg.  Seizure disorder: on valproic acid. EEG with slowing only   Lactic acidosis resolved   Functional quadriplegia   Thrombocytopenia: secondary to acute illness. Anemia panel ok.  Monitor  Procedures:    Consultations:  PCCM  WOC  neurology  Discharge Exam: Filed Vitals:   10/07/14 1413  BP: 134/73  Pulse: 95  Temp: 98.6 F (37 C)  Resp: 18    General: at baseline Cardiovascular: rrr Respiratory: clear  Discharge Instructions   Discharge Instructions    Discharge instructions    Complete by:  As directed   24 hour supervision/care by family Home health Interdry silver-impregnated fabric to provide antimicrobial benefits and wick drainage away from skin under breast. This should remain in place for 5 days for optimal plan of care. Barrier cream  to buttocks to protect and repel moisture. DYS 1 thin liquids     Increase activity slowly    Complete by:  As directed           Current Discharge Medication List    START taking these medications   Details  Amino Acids-Protein Hydrolys (FEEDING SUPPLEMENT, PRO-STAT SUGAR FREE 64,) LIQD Take 30 mLs by mouth 2 (two) times daily. Qty: 900 mL, Refills: 0    fluconazole (DIFLUCAN) 100 MG tablet Take 1 tablet (100 mg total) by mouth daily. Qty: 10 tablet, Refills: 0    loperamide (IMODIUM) 1 MG/5ML solution Take 5 mLs (1 mg total) by mouth as needed for diarrhea or loose stools. Qty: 120 mL, Refills: 0    Multiple Vitamin (MULTIVITAMIN WITH MINERALS) TABS tablet Take 1 tablet by mouth daily. Qty: 30 tablet, Refills: 0      CONTINUE these medications which have CHANGED   Details  clonazePAM (KLONOPIN) 0.5 MG tablet Take 0.5 tablets (0.25 mg total) by mouth at bedtime. Qty: 30 tablet, Refills: 0      CONTINUE these medications which have NOT CHANGED   Details  b complex vitamins tablet Take 1 tablet by mouth daily.    ENSURE (ENSURE) Take 237 mLs by mouth 3 (three) times daily between meals.    FIBER, GUAR GUM, PO Take 1 packet by mouth 3 (three) times daily. May stop if diarrhea resolves.  Mix with water and give thru DHT.    hydrALAZINE (APRESOLINE) 10 MG tablet Take 10 mg by mouth 3  (three) times daily.     predniSONE (DELTASONE) 20 MG tablet Take 60 mg by mouth daily. Refills: 0    Valproic Acid (DEPAKENE) 250 MG/5ML SYRP syrup Place 750 mg into feeding tube every 8 (eight) hours.       STOP taking these medications     nystatin cream (MYCOSTATIN)      oxyCODONE-acetaminophen (PERCOCET/ROXICET) 5-325 MG per tablet        Allergies  Allergen Reactions  . Penicillins Other (See Comments), Rash and Shortness Of Breath    REACTION: Makes her body hurt.  . Citalopram Nausea And Vomiting  . Clindamycin Rash and Other (See Comments)    GI upset  . Duloxetine Hcl Other (See Comments)    GI upset   . Escitalopram Nausea And Vomiting  . Hydromorphone Other (See Comments)    Generalized severe pain  . Morphine Other (See Comments)    Generalized severe pain  . Paroxetine Other (See Comments)    GI upset  . Sertraline Other (See Comments)    GI upset  . Sulfamethoxazole-Trimethoprim Nausea And Vomiting  . Tape Itching, Dermatitis and Rash  Paper tape only.  . Clarithromycin Rash  . Doxycycline Rash and Other (See Comments)    GI upset  . Latex Itching and Rash  . Metoclopramide Anxiety  . Morphine And Related Other (See Comments)    REACTION: Causes pain  . Paroxetine Hcl Other (See Comments)    REACTION:  unknown      The results of significant diagnostics from this hospitalization (including imaging, microbiology, ancillary and laboratory) are listed below for reference.    Significant Diagnostic Studies: Ct Head Wo Contrast  09/30/2014   CLINICAL DATA:  Acute encephalopathy.  EXAM: CT HEAD WITHOUT CONTRAST  TECHNIQUE: Contiguous axial images were obtained from the base of the skull through the vertex without intravenous contrast.  COMPARISON:  12/03/2013 MRI  FINDINGS: There is no intracranial hemorrhage, mass or evidence of acute infarction. There is no extra-axial fluid collection. There is moderately severe generalized atrophy and moderate  periventricular hypodensity which likely represents chronic small vessel disease. No superimposed acute findings are evident. No bony abnormalities are evident. The visible portions of the paranasal sinuses clear.  IMPRESSION: Moderately severe generalized atrophy and chronic small vessel disease. No acute findings.   Electronically Signed   By: Ellery Plunkaniel R Mitchell M.D.   On: 09/30/2014 04:45   Dg Chest Port 1 View  09/29/2014   CLINICAL DATA:  Altered mental status, some short of breath  EXAM: PORTABLE CHEST - 1 VIEW  COMPARISON:  Radiograph 01/03/2014  FINDINGS: Normal mediastinum and cardiac silhouette. Normal pulmonary vasculature. No evidence of effusion, infiltrate, or pneumothorax. No acute bony abnormality.  IMPRESSION: No acute cardiopulmonary process.   Electronically Signed   By: Genevive BiStewart  Edmunds M.D.   On: 09/29/2014 23:49    Microbiology: Recent Results (from the past 240 hour(s))  Blood Culture (routine x 2)     Status: None   Collection Time: 09/29/14 11:30 PM  Result Value Ref Range Status   Specimen Description BLOOD RIGHT FOREARM  Final   Special Requests BOTTLES DRAWN AEROBIC AND ANAEROBIC 5CC  Final   Culture   Final    NO GROWTH 5 DAYS Performed at [facility]    Report Status 10/06/2014 FINAL  Final  Blood Culture (routine x 2)     Status: None   Collection Time: 09/30/14 12:08 AM  Result Value Ref Range Status   Specimen Description BLOOD RIGHT HAND  Final   Special Requests BOTTLES DRAWN AEROBIC ONLY 2CC  Final   Culture   Final    NO GROWTH 5 DAYS Performed at [facility]    Report Status 10/06/2014 FINAL  Final  Clostridium Difficile by PCR     Status: None   Collection Time: 09/30/14 12:37 AM  Result Value Ref Range Status   C difficile by pcr NEGATIVE NEGATIVE Final  Urine culture     Status: None   Collection Time: 09/30/14  1:04 AM  Result Value Ref Range Status   Specimen Description URINE, CATHETERIZED  Final   Special Requests NONE   Final   Colony Count   Final    >=100,000 COLONIES/ML Performed at [facility]    Culture   Final    ENTEROCOCCUS SPECIES Performed at [facility]    Report Status 10/02/2014 FINAL  Final   Organism ID, Bacteria ENTEROCOCCUS SPECIES  Final      Susceptibility   Enterococcus species - MIC*    AMPICILLIN <=2 SENSITIVE Sensitive     LEVOFLOXACIN >=8 RESISTANT Resistant  NITROFURANTOIN 32 SENSITIVE Sensitive     VANCOMYCIN 2 SENSITIVE Sensitive     TETRACYCLINE >=16 RESISTANT Resistant     * ENTEROCOCCUS SPECIES  MRSA PCR Screening     Status: Abnormal   Collection Time: 09/30/14  5:38 AM  Result Value Ref Range Status   MRSA by PCR POSITIVE (A) NEGATIVE Final    Comment:        The GeneXpert MRSA Assay (FDA approved for NASAL specimens only), is one component of a comprehensive MRSA colonization surveillance program. It is not intended to diagnose MRSA infection nor to guide or monitor treatment for MRSA infections. RESULT CALLED TO, READ BACK BY AND VERIFIED WITH: E SMITH,RN AT 0845 09/30/14 BY K BARR      Labs: Basic Metabolic Panel:  Recent Labs Lab 10/02/14 0710 10/03/14 0518 10/04/14 1122 10/05/14 0610 10/06/14 0725  NA 144 146* 144 141 140  K 3.0* 3.7 3.9 2.8* 5.1  CL 110 114* 112* 106 107  CO2 24 25 20* 28 26  GLUCOSE 82 69* 89 77 57*  BUN <5* <5* <5* <5* <5*  CREATININE 0.60 0.52 0.58 0.47 0.44  CALCIUM 8.5* 8.4* 8.5* 7.8* 7.9*  MG 1.6*  --   --   --   --    Liver Function Tests: No results for input(s): AST, ALT, ALKPHOS, BILITOT, PROT, ALBUMIN in the last 168 hours. No results for input(s): LIPASE, AMYLASE in the last 168 hours. No results for input(s): AMMONIA in the last 168 hours. CBC:  Recent Labs Lab 10/01/14 0714 10/02/14 0710 10/03/14 0518 10/04/14 0630 10/05/14 0610  WBC 6.7 7.0 6.2 9.6 6.7  NEUTROABS 5.1  --   --   --   --   HGB 12.0 13.1 11.6* 12.4 10.9*  HCT 34.6* 37.9 33.8* 34.9* 31.6*  MCV 95.6 93.8 93.9  92.8 94.6  PLT 38* 55* 46* 41* 51*   Cardiac Enzymes: No results for input(s): CKTOTAL, CKMB, CKMBINDEX, TROPONINI in the last 168 hours. BNP: BNP (last 3 results) No results for input(s): BNP in the last 8760 hours.  ProBNP (last 3 results) No results for input(s): PROBNP in the last 8760 hours.  CBG:  Recent Labs Lab 10/05/14 1853  GLUCAP 85       Signed:  Porche Steinberger  [facility] 10/07/2014, 2:30 PM

## 2014-10-23 ENCOUNTER — Emergency Department (HOSPITAL_COMMUNITY): Payer: Medicare Other

## 2014-10-23 ENCOUNTER — Encounter (HOSPITAL_COMMUNITY): Payer: Self-pay | Admitting: Emergency Medicine

## 2014-10-23 ENCOUNTER — Inpatient Hospital Stay (HOSPITAL_COMMUNITY)
Admission: EM | Admit: 2014-10-23 | Discharge: 2014-10-31 | DRG: 698 | Disposition: A | Discharge status: Skilled Nursing Facility | Payer: Medicare Other | Attending: Internal Medicine | Admitting: Internal Medicine

## 2014-10-23 ENCOUNTER — Inpatient Hospital Stay (HOSPITAL_COMMUNITY): Payer: Medicare Other

## 2014-10-23 DIAGNOSIS — G609 Hereditary and idiopathic neuropathy, unspecified: Secondary | ICD-10-CM | POA: Diagnosis present

## 2014-10-23 DIAGNOSIS — R630 Anorexia: Secondary | ICD-10-CM

## 2014-10-23 DIAGNOSIS — N824 Other female intestinal-genital tract fistulae: Secondary | ICD-10-CM

## 2014-10-23 DIAGNOSIS — I10 Essential (primary) hypertension: Secondary | ICD-10-CM | POA: Diagnosis present

## 2014-10-23 DIAGNOSIS — R0902 Hypoxemia: Secondary | ICD-10-CM | POA: Diagnosis not present

## 2014-10-23 DIAGNOSIS — N390 Urinary tract infection, site not specified: Secondary | ICD-10-CM | POA: Diagnosis present

## 2014-10-23 DIAGNOSIS — E440 Moderate protein-calorie malnutrition: Secondary | ICD-10-CM | POA: Insufficient documentation

## 2014-10-23 DIAGNOSIS — Z882 Allergy status to sulfonamides status: Secondary | ICD-10-CM | POA: Diagnosis not present

## 2014-10-23 DIAGNOSIS — R532 Functional quadriplegia: Secondary | ICD-10-CM | POA: Diagnosis present

## 2014-10-23 DIAGNOSIS — N179 Acute kidney failure, unspecified: Secondary | ICD-10-CM | POA: Diagnosis present

## 2014-10-23 DIAGNOSIS — F419 Anxiety disorder, unspecified: Secondary | ICD-10-CM | POA: Diagnosis present

## 2014-10-23 DIAGNOSIS — D649 Anemia, unspecified: Secondary | ICD-10-CM | POA: Diagnosis present

## 2014-10-23 DIAGNOSIS — J9601 Acute respiratory failure with hypoxia: Secondary | ICD-10-CM | POA: Diagnosis present

## 2014-10-23 DIAGNOSIS — Z888 Allergy status to other drugs, medicaments and biological substances status: Secondary | ICD-10-CM

## 2014-10-23 DIAGNOSIS — E860 Dehydration: Secondary | ICD-10-CM | POA: Diagnosis present

## 2014-10-23 DIAGNOSIS — Z885 Allergy status to narcotic agent status: Secondary | ICD-10-CM | POA: Diagnosis not present

## 2014-10-23 DIAGNOSIS — G8250 Quadriplegia, unspecified: Secondary | ICD-10-CM | POA: Diagnosis not present

## 2014-10-23 DIAGNOSIS — Y846 Urinary catheterization as the cause of abnormal reaction of the patient, or of later complication, without mention of misadventure at the time of the procedure: Secondary | ICD-10-CM | POA: Diagnosis present

## 2014-10-23 DIAGNOSIS — L309 Dermatitis, unspecified: Secondary | ICD-10-CM | POA: Diagnosis present

## 2014-10-23 DIAGNOSIS — D61818 Other pancytopenia: Secondary | ICD-10-CM | POA: Diagnosis present

## 2014-10-23 DIAGNOSIS — D638 Anemia in other chronic diseases classified elsewhere: Secondary | ICD-10-CM | POA: Diagnosis present

## 2014-10-23 DIAGNOSIS — D696 Thrombocytopenia, unspecified: Secondary | ICD-10-CM | POA: Diagnosis present

## 2014-10-23 DIAGNOSIS — G629 Polyneuropathy, unspecified: Secondary | ICD-10-CM | POA: Diagnosis present

## 2014-10-23 DIAGNOSIS — B9620 Unspecified Escherichia coli [E. coli] as the cause of diseases classified elsewhere: Secondary | ICD-10-CM | POA: Diagnosis present

## 2014-10-23 DIAGNOSIS — Z6820 Body mass index (BMI) 20.0-20.9, adult: Secondary | ICD-10-CM | POA: Diagnosis not present

## 2014-10-23 DIAGNOSIS — M797 Fibromyalgia: Secondary | ICD-10-CM | POA: Diagnosis present

## 2014-10-23 DIAGNOSIS — Z87440 Personal history of urinary (tract) infections: Secondary | ICD-10-CM | POA: Diagnosis not present

## 2014-10-23 DIAGNOSIS — Z9884 Bariatric surgery status: Secondary | ICD-10-CM | POA: Diagnosis not present

## 2014-10-23 DIAGNOSIS — Z8249 Family history of ischemic heart disease and other diseases of the circulatory system: Secondary | ICD-10-CM

## 2014-10-23 DIAGNOSIS — Z91040 Latex allergy status: Secondary | ICD-10-CM | POA: Diagnosis not present

## 2014-10-23 DIAGNOSIS — D7589 Other specified diseases of blood and blood-forming organs: Secondary | ICD-10-CM | POA: Diagnosis present

## 2014-10-23 DIAGNOSIS — Z880 Allergy status to penicillin: Secondary | ICD-10-CM | POA: Diagnosis not present

## 2014-10-23 DIAGNOSIS — Z881 Allergy status to other antibiotic agents status: Secondary | ICD-10-CM

## 2014-10-23 DIAGNOSIS — Z833 Family history of diabetes mellitus: Secondary | ICD-10-CM | POA: Diagnosis not present

## 2014-10-23 DIAGNOSIS — R627 Adult failure to thrive: Secondary | ICD-10-CM | POA: Diagnosis present

## 2014-10-23 DIAGNOSIS — E870 Hyperosmolality and hypernatremia: Secondary | ICD-10-CM | POA: Diagnosis present

## 2014-10-23 DIAGNOSIS — Z515 Encounter for palliative care: Secondary | ICD-10-CM

## 2014-10-23 DIAGNOSIS — T8351XA Infection and inflammatory reaction due to indwelling urinary catheter, initial encounter: Secondary | ICD-10-CM | POA: Diagnosis not present

## 2014-10-23 DIAGNOSIS — Z7952 Long term (current) use of systemic steroids: Secondary | ICD-10-CM

## 2014-10-23 DIAGNOSIS — E876 Hypokalemia: Secondary | ICD-10-CM | POA: Diagnosis not present

## 2014-10-23 DIAGNOSIS — R197 Diarrhea, unspecified: Secondary | ICD-10-CM | POA: Diagnosis present

## 2014-10-23 DIAGNOSIS — E875 Hyperkalemia: Secondary | ICD-10-CM | POA: Diagnosis not present

## 2014-10-23 DIAGNOSIS — Z79899 Other long term (current) drug therapy: Secondary | ICD-10-CM | POA: Diagnosis not present

## 2014-10-23 DIAGNOSIS — D539 Nutritional anemia, unspecified: Secondary | ICD-10-CM | POA: Diagnosis present

## 2014-10-23 DIAGNOSIS — R0602 Shortness of breath: Secondary | ICD-10-CM | POA: Diagnosis not present

## 2014-10-23 LAB — COMPREHENSIVE METABOLIC PANEL
ALK PHOS: 68 U/L (ref 38–126)
ALT: 23 U/L (ref 14–54)
AST: 50 U/L — ABNORMAL HIGH (ref 15–41)
Albumin: 3 g/dL — ABNORMAL LOW (ref 3.5–5.0)
Anion gap: 14 (ref 5–15)
BUN: 8 mg/dL (ref 6–20)
CO2: 44 mmol/L — ABNORMAL HIGH (ref 22–32)
CREATININE: 0.78 mg/dL (ref 0.44–1.00)
Calcium: 8.9 mg/dL (ref 8.9–10.3)
Chloride: 86 mmol/L — ABNORMAL LOW (ref 101–111)
GFR calc Af Amer: >60 mL/min (ref >60)
GFR calc non Af Amer: >60 mL/min (ref >60)
GLUCOSE: 85 mg/dL (ref 65–99)
Potassium: 2.7 mmol/L — CL (ref 3.5–5.1)
Sodium: 144 mmol/L (ref 135–145)
Total Bilirubin: 1.2 mg/dL (ref 0.3–1.2)
Total Protein: 5.4 g/dL — ABNORMAL LOW (ref 6.5–8.1)

## 2014-10-23 LAB — URINALYSIS, ROUTINE W REFLEX MICROSCOPIC
BILIRUBIN URINE: NEGATIVE
Glucose, UA: NEGATIVE mg/dL
Ketones, ur: NEGATIVE mg/dL
NITRITE: POSITIVE — AB
PH: 6 (ref 5.0–8.0)
Protein, ur: 30 mg/dL — AB
Specific Gravity, Urine: 1.02 (ref 1.005–1.030)
UROBILINOGEN UA: 0.2 mg/dL (ref 0.0–1.0)

## 2014-10-23 LAB — I-STAT CHEM 8, ED
BUN: 7 mg/dL (ref 6–20)
CREATININE: 1.2 mg/dL — AB (ref 0.44–1.00)
Calcium, Ion: 1 mmol/L — ABNORMAL LOW (ref 1.12–1.23)
Chloride: 85 mmol/L — ABNORMAL LOW (ref 101–111)
Glucose, Bld: 82 mg/dL (ref 65–99)
HEMATOCRIT: 39 % (ref 36.0–46.0)
HEMOGLOBIN: 13.3 g/dL (ref 12.0–15.0)
POTASSIUM: 2.7 mmol/L — AB (ref 3.5–5.1)
Sodium: 140 mmol/L (ref 135–145)
TCO2: 46 mmol/L (ref 0–100)

## 2014-10-23 LAB — CBC WITH DIFFERENTIAL/PLATELET
Basophils Absolute: 0 10*3/uL (ref 0.0–0.1)
Basophils Relative: 0 % (ref 0–1)
EOS ABS: 0 10*3/uL (ref 0.0–0.7)
Eosinophils Relative: 0 % (ref 0–5)
HEMATOCRIT: 39.6 % (ref 36.0–46.0)
HEMOGLOBIN: 12.4 g/dL (ref 12.0–15.0)
LYMPHS ABS: 1.7 10*3/uL (ref 0.7–4.0)
LYMPHS PCT: 45 % (ref 12–46)
MCH: 35.2 pg — AB (ref 26.0–34.0)
MCHC: 31.3 g/dL (ref 30.0–36.0)
MCV: 112.5 fL — AB (ref 78.0–100.0)
MONOS PCT: 15 % — AB (ref 3–12)
Monocytes Absolute: 0.6 10*3/uL (ref 0.1–1.0)
Neutro Abs: 1.5 10*3/uL — ABNORMAL LOW (ref 1.7–7.7)
Neutrophils Relative %: 40 % — ABNORMAL LOW (ref 43–77)
PLATELETS: 52 10*3/uL — AB (ref 150–400)
RBC: 3.52 MIL/uL — AB (ref 3.87–5.11)
RDW: 19.4 % — AB (ref 11.5–15.5)
WBC: 3.8 10*3/uL — AB (ref 4.0–10.5)

## 2014-10-23 LAB — BLOOD GAS, VENOUS
Acid-Base Excess: 18.8 mmol/L — ABNORMAL HIGH (ref 0.0–2.0)
Bicarbonate: 43.8 mEq/L — ABNORMAL HIGH (ref 20.0–24.0)
O2 Saturation: 80.2 %
PH VEN: 7.574 — AB (ref 7.250–7.300)
Patient temperature: 98.6
TCO2: 39.3 mmol/L (ref 0–100)
pCO2, Ven: 47.2 mmHg (ref 45.0–50.0)
pO2, Ven: 40.1 mmHg (ref 30.0–45.0)

## 2014-10-23 LAB — I-STAT TROPONIN, ED: Troponin i, poc: 0.06 ng/mL (ref 0.00–0.08)

## 2014-10-23 LAB — I-STAT CG4 LACTIC ACID, ED
Lactic Acid, Venous: 1.63 mmol/L (ref 0.5–2.0)
Lactic Acid, Venous: 1.19 mmol/L (ref 0.5–2.0)

## 2014-10-23 LAB — URINE MICROSCOPIC-ADD ON

## 2014-10-23 LAB — D-DIMER, QUANTITATIVE (NOT AT ARMC): D DIMER QUANT: 0.51 ug{FEU}/mL — AB (ref 0.00–0.48)

## 2014-10-23 LAB — VALPROIC ACID LEVEL: VALPROIC ACID LVL: 72 ug/mL (ref 50.0–100.0)

## 2014-10-23 MED ORDER — METHYLPREDNISOLONE SODIUM SUCC 125 MG IJ SOLR
60.0000 mg | INTRAMUSCULAR | Status: DC
  Administered 2014-10-23 – 2014-10-25 (×3): 60 mg via INTRAVENOUS
  Filled 2014-10-23 (×3): qty 0.96
  Filled 2014-10-23: qty 2

## 2014-10-23 MED ORDER — VALPROATE SODIUM 500 MG/5ML IV SOLN
750.0000 mg | Freq: Three times a day (TID) | INTRAVENOUS | Status: DC
  Administered 2014-10-23 – 2014-10-31 (×24): 750 mg via INTRAVENOUS
  Filled 2014-10-23 (×26): qty 7.5

## 2014-10-23 MED ORDER — SODIUM CHLORIDE 0.9 % IV BOLUS (SEPSIS)
1000.0000 mL | Freq: Once | INTRAVENOUS | Status: AC
  Administered 2014-10-23: 1000 mL via INTRAVENOUS

## 2014-10-23 MED ORDER — CEFTRIAXONE SODIUM 1 G IJ SOLR
1.0000 g | Freq: Once | INTRAMUSCULAR | Status: AC
  Administered 2014-10-23: 1 g via INTRAVENOUS
  Filled 2014-10-23: qty 10

## 2014-10-23 MED ORDER — CETYLPYRIDINIUM CHLORIDE 0.05 % MT LIQD
7.0000 mL | Freq: Two times a day (BID) | OROMUCOSAL | Status: DC
  Administered 2014-10-23 – 2014-10-31 (×15): 7 mL via OROMUCOSAL

## 2014-10-23 MED ORDER — POTASSIUM CHLORIDE 10 MEQ/100ML IV SOLN
10.0000 meq | INTRAVENOUS | Status: AC
  Administered 2014-10-23 (×4): 10 meq via INTRAVENOUS
  Filled 2014-10-23 (×4): qty 100

## 2014-10-23 MED ORDER — KCL IN DEXTROSE-NACL 20-5-0.9 MEQ/L-%-% IV SOLN
INTRAVENOUS | Status: DC
  Administered 2014-10-23 – 2014-10-25 (×4): via INTRAVENOUS
  Filled 2014-10-23 (×7): qty 1000

## 2014-10-23 MED ORDER — CEFTRIAXONE SODIUM IN DEXTROSE 20 MG/ML IV SOLN
1.0000 g | INTRAVENOUS | Status: DC
  Administered 2014-10-24 – 2014-10-31 (×8): 1 g via INTRAVENOUS
  Filled 2014-10-23 (×8): qty 50

## 2014-10-23 NOTE — ED Notes (Signed)
Main lab phelbotomy at bedside. 

## 2014-10-23 NOTE — ED Notes (Signed)
Joan [name]/sister [phone].

## 2014-10-23 NOTE — Progress Notes (Signed)
RN paged because CTA chest could not be done secondary to no IV access with 20g. Pt has one PIV in foot and is chronically contracted from quadraplegia which makes IV access more difficult. However, RN to call IV team and let them try for a 20g. If able, will do CTA then. If unable, will defer CTA tonight. This NP reviewed chart re: suspicion for PE. Pt was hypoxic in the ED but hasn't been since admission. Empiric tx of PE was considered but pt has thrombocytopenia and labs are being drawn to r/o HIT. Therefore, at this time, do not feel safe starting Heparin or tx dosed Lovenox. The DDimer was only elevated at .51 as well. Pt is non verbal and can not relate complaints, so unknown if chest pain. The tele in the ED had no changes.  Will follow. Jimmye NormanKaren Kirby-Graham, NP [facility]

## 2014-10-23 NOTE — ED Notes (Signed)
Bed: WA05 Expected date:  Expected time:  Means of arrival:  Comments: SOB 

## 2014-10-23 NOTE — H&P (Addendum)
History and Physical  Joan [name] ZOX:[id number] DOB: 07/01/1960 DOA: 10/23/2014  Referring physician: EDP PCP: Angela Coxasanayaka, [name] Y, MD   Chief Complaint: sob  HPI: Joan [name] is a 54 y.o. female  With h/o catatonia, encephalopathy, progressive neurodegenerative decline of unclear diagnosis followed by [facility] on chronic steroids, indwelling foley, chronic quadriplegia, contracture, sent from home to ER due to c/o sob for the last three days, currently no family member in room, patient is nonverbal, per chart review and talking to EDP and RN, patient son reported that patient has been c/o sob for the last three days, due to concerning for discomfort from indwelling foley, patient son removed foley two days ago, EMS reported patient was hypoxia in the 80's, she was put on NRB and sent to ER, she was initially put on nasal cannula, but later she was kept on room air without desaturation, she was also noted to have intermittent sinus tachycardia, especially when appear more awake. ua in the ED showed uti. Lab work also showed hypokalemia , k of 2.7, she is given rocephin x1 and hospitalist called for admission.   Review of Systems:  Detail per HPI, Review of systems are otherwise negative  Past Medical History  Diagnosis Date  . Peripheral neuropathy   . Fibromyalgia   . Hypertension   . Anxiety   . Catatonia   . Seizures    Past Surgical History  Procedure Laterality Date  . Gastric bypass    . Abdominal hysterectomy    . Jejunostomy N/A 06/25/2013    Procedure:  OPEN JEJUNOSTOMY FEEDING TUBE ;  Surgeon: Cherylynn RidgesJames O [name], MD;  [facility];  Service: General;  Laterality: N/A;  . Laparotomy N/A 07/02/2013    Procedure: EXPLORATORY LAPAROTOMY with revision of jejunostomy feeding tube.;  Surgeon: Cherylynn RidgesJames O [name], MD;  [facility];  Service: General;  Laterality: N/A;  . Bowel resection N/A 07/02/2013    Procedure: SMALL BOWEL RESECTION;  Surgeon: Cherylynn RidgesJames O [name], MD;   [facility];  Service: General;  Laterality: N/A;   Social History:  reports that she has never smoked. She has never used smokeless tobacco. She reports that she does not drink alcohol. Her drug history is not on file. Patient lives at home , total care  Allergies  Allergen Reactions  . Penicillins Other (See Comments), Rash and Shortness Of Breath    REACTION: Makes her body hurt.  . Citalopram Nausea And Vomiting  . Clindamycin Rash and Other (See Comments)    GI upset  . Duloxetine Hcl Other (See Comments)    GI upset   . Escitalopram Nausea And Vomiting  . Hydromorphone Other (See Comments)    Generalized severe pain  . Morphine Other (See Comments)    Generalized severe pain  . Paroxetine Other (See Comments)    GI upset  . Sertraline Other (See Comments)    GI upset  . Sulfamethoxazole-Trimethoprim Nausea And Vomiting  . Tape Itching, Dermatitis and Rash    Paper tape only.  . Clarithromycin Rash  . Doxycycline Rash and Other (See Comments)    GI upset  . Latex Itching and Rash  . Metoclopramide Anxiety  . Morphine And Related Other (See Comments)    REACTION: Causes pain  . Paroxetine Hcl Other (See Comments)    REACTION:  unknown    Family History  Problem Relation Age of Onset  . Hypertension Mother   . Diabetes Father      Prior  to Admission medications   Medication Sig Start Date End Date Taking? Authorizing Provider  Amino Acids-Protein Hydrolys (FEEDING SUPPLEMENT, PRO-STAT SUGAR FREE 64,) LIQD Take 30 mLs by mouth 2 (two) times daily. 10/07/14  Yes Joseph Art, DO  b complex vitamins tablet Take 1 tablet by mouth daily.   Yes Historical Provider, MD  clonazePAM (KLONOPIN) 0.5 MG tablet Take 0.5 tablets (0.25 mg total) by mouth at bedtime. Patient taking differently: Take 0.5 mg by mouth 2 (two) times daily.  10/07/14  Yes Joseph Art, DO  fluconazole (DIFLUCAN) 150 MG tablet Take 150 mg by mouth daily. 10/16/14  Yes Historical Provider, MD    furosemide (LASIX) 20 MG tablet Take 20 mg by mouth daily. 10/13/14  Yes Historical Provider, MD  hydrALAZINE (APRESOLINE) 10 MG tablet Take 10 mg by mouth 3 (three) times daily.  12/04/13  Yes Dorothea Ogle, MD  Multiple Vitamin (MULTIVITAMIN WITH MINERALS) TABS tablet Take 1 tablet by mouth daily. 10/07/14  Yes Joseph Art, DO  predniSONE (DELTASONE) 20 MG tablet Take 60 mg by mouth daily. 09/24/14  Yes Historical Provider, MD  Valproic Acid (DEPAKENE) 250 MG/5ML SYRP syrup Place 750 mg into feeding tube every 8 (eight) hours.  08/27/13  Yes Alison Murray, MD  fluconazole (DIFLUCAN) 100 MG tablet Take 1 tablet (100 mg total) by mouth daily. Patient not taking: Reported on 10/23/2014 10/07/14   Joseph Art, DO  loperamide (IMODIUM) 1 MG/5ML solution Take 5 mLs (1 mg total) by mouth as needed for diarrhea or loose stools. Patient not taking: Reported on 10/23/2014 10/07/14   Joseph Art, DO    Physical Exam: BP 113/74 mmHg  Pulse 117  Temp(Src) 98.6 F (37 C) (Oral)  Resp 14  Ht  (1.651 m)  Wt 56.019 kg (123 lb 8 oz)  BMI 20.55 kg/m2  SpO2 100%  General:  Open eyes spontaneously , but does not track, does attempt to open her mouth.  when asked to do so. Chronically ill, bilateral upper extremity contracture, bilateral foot drop. Eyes: PERRL ENT: unremarkable Neck: supple, no JVD Cardiovascular: RRR Respiratory: diminished but no significant rales, no wheezing, no rhonchi Abdomen: soft/ND/ND, positive bowel sounds Skin: no rash,  Musculoskeletal:  No edema Psychiatric: not interactive Neurologic: as mentioned above.           Labs on Admission:  Basic Metabolic Panel:  Recent Labs Lab 10/23/14 0809 10/23/14 0812  NA 140 144  K 2.7* 2.7*  CL 85* 86*  CO2  --  44*  GLUCOSE 82 85  BUN 7 8  CREATININE 1.20* 0.78  CALCIUM  --  8.9   Liver Function Tests:  Recent Labs Lab 10/23/14 0812  AST 50*  ALT 23  ALKPHOS 68  BILITOT 1.2  PROT 5.4*  ALBUMIN 3.0*    No results for input(s): LIPASE, AMYLASE in the last 168 hours. No results for input(s): AMMONIA in the last 168 hours. CBC:  Recent Labs Lab 10/23/14 0809 10/23/14 0812  WBC  --  3.8*  NEUTROABS  --  1.5*  HGB 13.3 12.4  HCT 39.0 39.6  MCV  --  112.5*  PLT  --  52*   Cardiac Enzymes: No results for input(s): CKTOTAL, CKMB, CKMBINDEX, TROPONINI in the last 168 hours.  BNP (last 3 results) No results for input(s): BNP in the last 8760 hours.  ProBNP (last 3 results) No results for input(s): PROBNP in the last 8760 hours.  CBG:  No results for input(s): GLUCAP in the last 168 hours.  Radiological Exams on Admission: Dg Chest Port 1 View  10/23/2014   CLINICAL DATA:  Hypoxia  EXAM: PORTABLE CHEST - 1 VIEW  COMPARISON:  Sep 29, 2014  FINDINGS: There is no edema or consolidation. Heart size and pulmonary vascularity are normal. No adenopathy. Bones appear somewhat osteoporotic.  IMPRESSION: No edema or consolidation.   Electronically Signed   By: Bretta Bang [name] M.D.   On: 10/23/2014 08:33    EKG: not obtained, sinus rhythm on tele  Assessment/Plan Present on Admission:  . Hypoxia   Hypoxia, sob, crx unremarkable. From atelectasis, poor respiratory efforts? Mild elevated ddimer will get CTA to r/o PE, patient does has risk of PE due to immobility.  UTI, culture pending, with h/o recurrent uti, likely secondary to indwelling foley. Continue rocephin.  Hypokalemia: replace k.  Thrombocytopenia: unclear etiology, no hemolysis. Will check b12/folate/HITpanel.   Chronic debility: overall poor prognosis. Will keep npo for now, swallow eval pending. Per chart review family was not interested in palliative care during last admission, likely this will need to be readdressed if patient does not improve with treating uti.  Will hold all oral meds for now, change to iv steroids and iv valproic acid.  Consultants: none  Code Status: full  Family Communication:  None in  the room  Disposition Plan: admit to tele  Time spent: >28mins  Joan Bale MD, PhD [facility] Pager [phone] If 7PM-7AM, please contact night-coverage at [email or web address], password [facility]

## 2014-10-23 NOTE — ED Notes (Signed)
EDP at bedside  

## 2014-10-23 NOTE — ED Provider Notes (Signed)
CSN: [id number]     Arrival date & time 10/23/14  [id number] History   First MD Initiated Contact with Patient 10/23/14 [phone]     Chief Complaint  Patient presents with  . Shortness of Breath     (Consider location/radiation/quality/duration/timing/severity/associated sxs/prior Treatment) HPI Comments: Level V Caveat due to nonverbal.  History provided by EMS report, who state she was hypoxic into the 70's.  They report that family says she has had SOB for a few days.    Patient is a 54 y.o. female presenting with shortness of breath.  Shortness of Breath Severity:  Severe Onset quality:  Gradual Duration:  2 days Timing:  Constant Progression:  Worsening Chronicity:  New Context comment:  Hospitalized recently due to UTI Relieved by:  Oxygen Worsened by:  Nothing tried   Past Medical History  Diagnosis Date  . Peripheral neuropathy   . Fibromyalgia   . Hypertension   . Anxiety   . Catatonia   . Seizures    Past Surgical History  Procedure Laterality Date  . Gastric bypass    . Abdominal hysterectomy    . Jejunostomy N/A 06/25/2013    Procedure:  OPEN JEJUNOSTOMY FEEDING TUBE ;  Surgeon: Cherylynn RidgesJames O [name], MD;  [facility];  Service: General;  Laterality: N/A;  . Laparotomy N/A 07/02/2013    Procedure: EXPLORATORY LAPAROTOMY with revision of jejunostomy feeding tube.;  Surgeon: Cherylynn RidgesJames O [name], MD;  [facility];  Service: General;  Laterality: N/A;  . Bowel resection N/A 07/02/2013    Procedure: SMALL BOWEL RESECTION;  Surgeon: Cherylynn RidgesJames O [name], MD;  [facility];  Service: General;  Laterality: N/A;   Family History  Problem Relation Age of Onset  . Hypertension Mother   . Diabetes Father    History  Substance Use Topics  . Smoking status: Never Smoker   . Smokeless tobacco: Never Used  . Alcohol Use: No   OB History    No data available     Review of Systems  Unable to perform ROS: Patient nonverbal  Respiratory: Positive for shortness of breath.        Allergies  Penicillins; Citalopram; Clindamycin; Duloxetine hcl; Escitalopram; Hydromorphone; Morphine; Paroxetine; Sertraline; Sulfamethoxazole-trimethoprim; Tape; Clarithromycin; Doxycycline; Latex; Metoclopramide; Morphine and related; and Paroxetine hcl  Home Medications   Prior to Admission medications   Medication Sig Start Date End Date Taking? Authorizing Provider  Amino Acids-Protein Hydrolys (FEEDING SUPPLEMENT, PRO-STAT SUGAR FREE 64,) LIQD Take 30 mLs by mouth 2 (two) times daily. 10/07/14  Yes Joseph ArtJessica U Vann, DO  b complex vitamins tablet Take 1 tablet by mouth daily.   Yes Historical Provider, MD  clonazePAM (KLONOPIN) 0.5 MG tablet Take 0.5 tablets (0.25 mg total) by mouth at bedtime. Patient taking differently: Take 0.5 mg by mouth 2 (two) times daily.  10/07/14  Yes Joseph ArtJessica U Vann, DO  fluconazole (DIFLUCAN) 150 MG tablet Take 150 mg by mouth daily. 10/16/14  Yes Historical Provider, MD  furosemide (LASIX) 20 MG tablet Take 20 mg by mouth daily. 10/13/14  Yes Historical Provider, MD  hydrALAZINE (APRESOLINE) 10 MG tablet Take 10 mg by mouth 3 (three) times daily.  12/04/13  Yes Dorothea OgleIskra M Myers, MD  Multiple Vitamin (MULTIVITAMIN WITH MINERALS) TABS tablet Take 1 tablet by mouth daily. 10/07/14  Yes Joseph ArtJessica U Vann, DO  predniSONE (DELTASONE) 20 MG tablet Take 60 mg by mouth daily. 09/24/14  Yes Historical Provider, MD  Valproic Acid (DEPAKENE) 250 MG/5ML SYRP syrup Place 750  mg into feeding tube every 8 (eight) hours.  08/27/13  Yes Alison Murray, MD  fluconazole (DIFLUCAN) 100 MG tablet Take 1 tablet (100 mg total) by mouth daily. Patient not taking: Reported on 10/23/2014 10/07/14   Joseph Art, DO  loperamide (IMODIUM) 1 MG/5ML solution Take 5 mLs (1 mg total) by mouth as needed for diarrhea or loose stools. Patient not taking: Reported on 10/23/2014 10/07/14   Selinda Orion Vann, DO   BP 129/78 mmHg  Pulse 130  Temp(Src) 99.8 F (37.7 C) (Rectal)  Resp 30  SpO2 100% Physical  Exam  Constitutional: She appears well-nourished. No distress.  contractured  HENT:  Head: Normocephalic and atraumatic.  Mouth/Throat: Oropharynx is clear and moist.  Eyes: Conjunctivae are normal. Pupils are equal, round, and reactive to light. No scleral icterus.  Neck: Neck supple.  Cardiovascular: Normal rate, regular rhythm, normal heart sounds and intact distal pulses.   No murmur heard. Pulmonary/Chest: Effort normal and breath sounds normal. No stridor. No respiratory distress. She has no rales.  Abdominal: Soft. Bowel sounds are normal. She exhibits no distension. There is no tenderness. There is no rebound.  Musculoskeletal: Normal range of motion.  Neurological: She is alert.  Eyes open, doesn't follow commands, nonverbal.  Skin: Skin is warm and dry. No rash noted.  Psychiatric:  Unable to test  Nursing note and vitals reviewed.   ED Course  Procedures (including critical care time) Labs Review Labs Reviewed  I-STAT CHEM 8, ED - Abnormal; Notable for the following:    Potassium 2.7 (*)    Chloride 85 (*)    Creatinine, Ser 1.20 (*)    Calcium, Ion 1.00 (*)    All other components within normal limits  CBC WITH DIFFERENTIAL/PLATELET  COMPREHENSIVE METABOLIC PANEL  URINALYSIS, ROUTINE W REFLEX MICROSCOPIC (NOT AT [facility])  I-STAT CG4 LACTIC ACID, ED  Rosezena Sensor, ED    Imaging Review Dg Chest Port 1 View  10/23/2014   CLINICAL DATA:  Hypoxia  EXAM: PORTABLE CHEST - 1 VIEW  COMPARISON:  Sep 29, 2014  FINDINGS: There is no edema or consolidation. Heart size and pulmonary vascularity are normal. No adenopathy. Bones appear somewhat osteoporotic.  IMPRESSION: No edema or consolidation.   Electronically Signed   By: Bretta Bang [name] M.D.   On: 10/23/2014 08:33     EKG Interpretation None      MDM   Final diagnoses:  Hypoxia  UTI Altered Mental status  Presented for SOB, which appears improved.  Per family, real reason for EMS transport was altered  mental status.  This likely secondary to UTI.  She had a recent extensive workup for altered mental status which was reported to be largely negative.  Plan abx and admit.     Blake Divine, MD 10/24/14 [phone]

## 2014-10-23 NOTE — ED Notes (Signed)
Only able to collect enough blood for i-stat. Currently attempting second IV with ultrasound

## 2014-10-23 NOTE — ED Notes (Signed)
Wofford at bedside. 

## 2014-10-23 NOTE — ED Notes (Signed)
Awaiting portable DG prior to collection of urinalysis.

## 2014-10-23 NOTE — Consult Note (Signed)
WOC wound consult note Reason for Consult:MASD: specifically, Intertriginous dermatitis of the inframammary areas and incontinence associated dermatitis Wound type:MASD Pressure Ulcer POA: No Measurement: 0.4cm round x 0.2cm Wound GMW:NUUVObed:clean, pink, moist Drainage (amount, consistency, odor) none Periwound:macerated Dressing procedure/placement/frequency:We will provide a mattress replacement with low air loss feature, Prevalon boots for pressure redistribution to the heels and guidance for incontinence care using house products.  Additionally,  We will add our house antimicrobial textile for the inframammary intertriginous dermatitis. WOC nursing team will not follow, but will remain available to this patient, the nursing and medical team.  Please re-consult if needed. Thanks, Ladona MowLaurie Xaidyn Kepner, MSN, RN, GNP, AllisonWOCN, CWON-AP [phone])

## 2014-10-23 NOTE — ED Notes (Signed)
[name], son of pt, updated and made aware pt to be admitted 1413.

## 2014-10-23 NOTE — ED Notes (Signed)
Per son [name] pt diagnosed with UTI/yeast infection. Son continues to reports indwelling catheter since December; pt continues to have issues with catheter clogging "with what looks like stool each month." Son then states pt complaint of pain at catheter site so he took it out on Tuesday; since removal of catheter son reports pt complaint of cannot breath. Urology appointment scheduled for 11/19/2014.

## 2014-10-23 NOTE — ED Notes (Signed)
Per EMS pt complaint of SOB for three days per family. Pt contracted and nonverbal per normal. Pt moans with intervention.

## 2014-10-24 ENCOUNTER — Inpatient Hospital Stay (HOSPITAL_COMMUNITY): Payer: Medicare Other

## 2014-10-24 LAB — CBC
HCT: 33.9 % — ABNORMAL LOW (ref 36.0–46.0)
Hemoglobin: 10.7 g/dL — ABNORMAL LOW (ref 12.0–15.0)
MCH: 35.7 pg — ABNORMAL HIGH (ref 26.0–34.0)
MCHC: 31.6 g/dL (ref 30.0–36.0)
MCV: 113 fL — ABNORMAL HIGH (ref 78.0–100.0)
PLATELETS: 51 10*3/uL — AB (ref 150–400)
RBC: 3 MIL/uL — ABNORMAL LOW (ref 3.87–5.11)
RDW: 19.8 % — AB (ref 11.5–15.5)
WBC: 2.5 10*3/uL — AB (ref 4.0–10.5)

## 2014-10-24 LAB — COMPREHENSIVE METABOLIC PANEL
ALBUMIN: 2.3 g/dL — AB (ref 3.5–5.0)
ALT: 19 U/L (ref 14–54)
ANION GAP: 10 (ref 5–15)
AST: 34 U/L (ref 15–41)
Alkaline Phosphatase: 51 U/L (ref 38–126)
BILIRUBIN TOTAL: 0.8 mg/dL (ref 0.3–1.2)
BUN: 8 mg/dL (ref 6–20)
CALCIUM: 8.2 mg/dL — AB (ref 8.9–10.3)
CO2: 39 mmol/L — AB (ref 22–32)
Chloride: 95 mmol/L — ABNORMAL LOW (ref 101–111)
Creatinine, Ser: 0.63 mg/dL (ref 0.44–1.00)
GFR calc non Af Amer: >60 mL/min (ref >60)
GLUCOSE: 147 mg/dL — AB (ref 65–99)
Potassium: 3.7 mmol/L (ref 3.5–5.1)
SODIUM: 144 mmol/L (ref 135–145)
Total Protein: 4.4 g/dL — ABNORMAL LOW (ref 6.5–8.1)

## 2014-10-24 LAB — VITAMIN B12: VITAMIN B 12: 1312 pg/mL — AB (ref 180–914)

## 2014-10-24 LAB — PROTIME-INR
INR: 1.4 (ref 0.00–1.49)
Prothrombin Time: 17.3 s — ABNORMAL HIGH (ref 11.6–15.2)

## 2014-10-24 LAB — TSH: TSH: 2.342 u[IU]/mL (ref 0.350–4.500)

## 2014-10-24 MED ORDER — FENTANYL CITRATE (PF) 100 MCG/2ML IJ SOLN
12.5000 ug | INTRAMUSCULAR | Status: DC | PRN
Start: 2014-10-24 — End: 2014-10-31
  Administered 2014-10-24 – 2014-10-31 (×8): 12.5 ug via INTRAVENOUS
  Filled 2014-10-24 (×8): qty 2

## 2014-10-24 NOTE — Care Management Note (Signed)
Case Management Note  Patient Details  Name: Candida PeelingLora Heyward MRN: [id number] Date of Birth: 12/31/1960  Subjective/Objective:  54 y/o f admitted w/Hypoxia.Readmit-5/2-5/10/16.From home-Active w/[facility]-Kristen rep aware of admission.                  Action/Plan:PT cons placed await recommendations.   Expected Discharge Date:                  Expected Discharge Plan:  Home w Home Health Services  In-House Referral:  Clinical Social Work  Discharge planning Services  CM Consult  Post Acute Care Choice:  Home Health (Active w/[facility]-HHRN/HHPT/HHOT/HH NUrse's adide/HH Child psychotherapistsocial worker) Choice offered to:     DME Arranged:    DME Agency:     HH Arranged:    HH Agency:     Status of Service:  In process, will continue to follow  Medicare Important Message Given:    Date Medicare IM Given:    Medicare IM give by:    Date Additional Medicare IM Given:    Additional Medicare Important Message give by:     If discussed at Long Length of Stay Meetings, dates discussed:    Additional Comments:  Lanier ClamMahabir, Dinorah Masullo, RN 10/24/2014, 3:48 PM

## 2014-10-24 NOTE — Evaluation (Signed)
Clinical/Bedside Swallow Evaluation Patient Details  Name: Joan [name] MRN: [id number] Date of Birth: 01/03/1961  Today's Date: 10/24/2014 Time: SLP Start Time (ACUTE ONLY): 1545 SLP Stop Time (ACUTE ONLY): 1623 SLP Time Calculation (min) (ACUTE ONLY): 38 min  Past Medical History:  Past Medical History  Diagnosis Date  . Peripheral neuropathy   . Fibromyalgia   . Hypertension   . Anxiety   . Catatonia   . Seizures    Past Surgical History:  Past Surgical History  Procedure Laterality Date  . Gastric bypass    . Abdominal hysterectomy    . Jejunostomy N/A 06/25/2013    Procedure:  OPEN JEJUNOSTOMY FEEDING TUBE ;  Surgeon: Cherylynn RidgesJames O [name], MD;  [facility];  Service: General;  Laterality: N/A;  . Laparotomy N/A 07/02/2013    Procedure: EXPLORATORY LAPAROTOMY with revision of jejunostomy feeding tube.;  Surgeon: Cherylynn RidgesJames O [name], MD;  [facility];  Service: General;  Laterality: N/A;  . Bowel resection N/A 07/02/2013    Procedure: SMALL BOWEL RESECTION;  Surgeon: Cherylynn RidgesJames O [name], MD;  Location: [facility] OR;  Service: General;  Laterality: N/A;   HPI:  With h/o catatonia, encephalopathy, progressive neurodegenerative decline of unclear diagnosis followed by [facility] on chronic steroids, indwelling foley, chronic quadriplegia, contracture, sent from home to ER due to c/o sob for the last three days, currently no family member in room, patient is nonverbal, per chart review and talking to EDP and RN, patient son reported that patient has been c/o sob for the last three days, due to concerning for discomfort from indwelling foley, patient son removed foley two days ago, EMS reported patient was hypoxia in the 80's, she was put on NRB and sent to ER, she was initially put on nasal cannula, but later she was kept on room air without desaturation, she was also noted to have intermittent sinus tachycardia, especially when appear more awake.   Assessment / Plan / Recommendation Clinical  Impression  Pt alert but nonverbal. Pt initially had head turned to far left and eye gaze was also to left.  Pt was unable to redirect her gaze to right for ~ 30 seconds, but later was able to look at examiner.  ? Seizure activity- RN notified.  Pt  refused ice chip,, clamping lips together tightly.  Pt opened mouth and accepted bites of applesauce with intermitten oral holding of the bolus.  Pt swallowed sips of water via straw with no overt s/s of aspiration. Again, there was intermittent holding of the bolus prior to initiating the swallow.  Pt will need full supervision and total assist for feeding.  Recommend Dys 1 (purees) with thin liquids via straw.    Aspiration Risk  Mild    Diet Recommendation Dysphagia 1 (Puree);Thin   Medication Administration: Crushed with puree Compensations: Slow rate;Small sips/bites    Other  Recommendations Oral Care Recommendations: Oral care BID Other Recommendations: Have oral suction available   Follow Up Recommendations       Frequency and Duration min 2x/week  2 weeks   Pertinent Vitals/Pain n/a         Swallow Study Prior Functional Status       General Other Pertinent Information: With h/o catatonia, encephalopathy, progressive neurodegenerative decline of unclear diagnosis followed by [facility] on chronic steroids, indwelling foley, chronic quadriplegia, contracture, sent from home to ER due to c/o sob for the last three days, currently no family member in room, patient is nonverbal, per  chart review and talking to EDP and RN, patient son reported that patient has been c/o sob for the last three days, due to concerning for discomfort from indwelling foley, patient son removed foley two days ago, EMS reported patient was hypoxia in the 80's, she was put on NRB and sent to ER, she was initially put on nasal cannula, but later she was kept on room air without desaturation, she was also noted to have intermittent sinus tachycardia,  especially when appear more awake. Type of Study: Bedside swallow evaluation Previous Swallow Assessment: ; OP MBS 7/15 Diet Prior to this Study: NPO Temperature Spikes Noted: No Respiratory Status: Room air History of Recent Intubation: No Behavior/Cognition: Alert;Distractible;Requires cueing;Doesn't follow directions Oral Cavity - Dentition: Missing dentition Self-Feeding Abilities: Total assist Patient Positioning: Upright in bed Baseline Vocal Quality: Normal Volitional Cough: Cognitively unable to elicit Volitional Swallow: Unable to elicit    Oral/Motor/Sensory Function Overall Oral Motor/Sensory Function: Impaired at baseline   Ice Chips Ice chips:  (pt refused (clamped lips tightly together))   Thin Liquid Thin Liquid: Within functional limits Presentation: Straw Pharyngeal  Phase Impairments: Suspected delayed Swallow    Nectar Thick Nectar Thick Liquid: Not tested   Honey Thick Honey Thick Liquid: Not tested   Puree Puree: Impaired Presentation: Spoon Oral Phase Impairments: Impaired anterior to posterior transit;Reduced lingual movement/coordination Oral Phase Functional Implications: Prolonged oral transit;Oral holding Pharyngeal Phase Impairments: Suspected delayed Swallow   Solid   GO    Solid: Not tested       Maryjo Rochester T 10/24/2014,4:24 PM

## 2014-10-24 NOTE — Progress Notes (Signed)
Placed interdry under each breast on pt. Per order. Rockne Coonsorcoran, Elecia Serafin C, RN

## 2014-10-24 NOTE — Progress Notes (Addendum)
Patient ID: Candida PeelingLora Peeples, female   DOB: 11/08/1960, 54 y.o.   MRN: [id number]  [facility] PROGRESS NOTE  Candida PeelingLora Barella [id number] DOB: 07/15/1960 DOA: 10/23/2014 PCP: Angela Coxasanayaka, [name] Y, MD   Brief narrative:    Patient is 54 year old female with catatonia, progressive neurodegenerative decline of unclear diagnosis, followed by [facility], on chronic steroids and indwelling Foley catheter, chronic quadriplegia with contractures, brought from home to emergency department with main concern of several days duration of progressively worsening dyspnea at rest and with minimal exertion. Please note that patient is nonverbal and unable to provide any history. Family was not available in the emergency department when patient was admitted. No reported chest pain, fevers or chills.  In emergency department patient noted to be hemodynamically stable, vital signs notable for HR 1:30, RR 30, oxygen saturation 77% on room air. Blood work notable for platelets 50 2K, potassium 2.7, CO2 = 44. [facility] asked to admit for further evaluation of acute hypoxic respiratory failure.  Assessment/Plan:    Active Problems: Acute hypoxic respiratory failure - Unclear etiology at this time, chest x-ray with no clear acute cardiopulmonary findings - Patient is currently hemodynamically stable, maintaining oxygen saturations above 96% on room air, no signs of respiratory distress - We'll continue to monitor throughout the day - Provide analgesia as needed, CT chest pending to rule out PE/PNA  Questionable UTI - Suggested by urinalysis, possibly secondary to chronic indwelling Foley catheter - Patient was started on Rocephin, will continue day # 2 - Follow-up on urine cultures  Hypokalemia - Supplemented and within normal limits this morning - Repeat BMP in the morning  Thrombocytopenia - B12, folate, HRT panel pending - No signs of active bleeding - Repeat CBC in the morning  Chronic  debility with quadriplegia, severe contractures - Patient nonverbal, currently nothing by mouth, swallow evaluation. - We'll discuss with family palliative care consult  Severe protein calorie malnutrition - In the setting of progressive decline and failure to thrive - SLP requested, nutrition is consulted as well  Acute renal failure - Secondary to prerenal etiology, provide IV fluids and repeat BMP in the morning  Anemia of chronic disease, macrocytic anemia - Vitamin B-12, folate pending - Drop in hemoglobin since admission likely from IV fluids and dilutional component - No signs of active bleeding, repeat CBC in AM  DVT prophylaxis - SCDs  Code Status: Full.  Family Communication:  plan of care discussed with the patient Disposition Plan: Not ready for d/c at this time, SLP pending   IV access:  Peripheral IV  Procedures and diagnostic studies:     Dg Chest Port 1 View 10/23/2014   No edema or consolidation.   Medical Consultants:  None  Other Consultants:  Nutritionist SLP  IAnti-Infectives:   Rocephin 10/23/2014 -->  Debbora PrestoMAGICK-Charyl Minervini, MD  [facility] Pager [phone]  If 7PM-7AM, please contact night-coverage [email or web address] Password [email or web address] 10/24/2014, 7:02 AM   LOS: 1 day   HPI/Subjective: No events overnight. Pt is non verbal   Objective: Filed Vitals:   10/23/14 1622 10/23/14 1623 10/23/14 2232 10/24/14 0437  BP:   124/69 102/77  Pulse:   89 66  Temp:   98.3 F (36.8 C) 97.7 F (36.5 C)  TempSrc:   Axillary Oral  Resp:   16 16  Height:  5\' 5"  (1.651 m)    Weight: 56.019 kg (123 lb 8 oz)     SpO2: 100%  96% 100%    Intake/Output Summary (Last 24  hours) at 10/24/14 1610 Last data filed at 10/23/14 2303  Gross per 24 hour  Intake 806.25 ml  Output      0 ml  Net 806.25 ml    Exam:   General:  Pt is alert, non verbal, NAD  Cardiovascular: Regular rate and rhythm, no rubs, no gallops  Respiratory: Clear to auscultation bilaterally, no  wheezing, diminished breath sounds at bases   Abdomen: Soft, non tender, non distended, bowel sounds present, no guarding  Extremities: contracted, quadriplegic   Data Reviewed: Basic Metabolic Panel:  Recent Labs Lab 10/23/14 0809 10/23/14 0812 10/24/14 0550  NA 140 144 144  K 2.7* 2.7* 3.7  CL 85* 86* 95*  CO2  --  44* 39*  GLUCOSE 82 85 147*  BUN CREATININE 1.20* 0.78 0.63  CALCIUM  --  8.9 8.2*   Liver Function Tests:  Recent Labs Lab 10/23/14 0812 10/24/14 0550  AST 50* 34  ALT 23 19  ALKPHOS 68 51  BILITOT 1.2 0.8  PROT 5.4* 4.4*  ALBUMIN 3.0* 2.3*   CBC:  Recent Labs Lab 10/23/14 0809 10/23/14 0812 10/24/14 0550  WBC  --  3.8* 2.5*  NEUTROABS  --  1.5*  --   HGB 13.3 12.4 10.7*  HCT 39.0 39.6 33.9*  MCV  --  112.5* 113.0*  PLT  --  52* 51*   Scheduled Meds: . antiseptic oral rinse  7 mL Mouth Rinse BID  . cefTRIAXone (ROCEPHIN)  IV  1 g Intravenous Q24H  . methylPREDNISolone (SOLU-MEDROL) injection  60 mg Intravenous Q24H  . valproate sodium  750 mg Intravenous Q8H   Continuous Infusions: . dextrose 5 % and 0.9 % NaCl with KCl 20 mEq/L 75 mL/hr at 10/23/14 1910

## 2014-10-24 NOTE — Progress Notes (Signed)
IV team came to pt and tried/attempted to start IV for CT scan. IV team was here for at least an hour trying to find an area but was unsuccessful. Rockne Coonsorcoran, Zonya Gudger C, RN

## 2014-10-24 NOTE — Progress Notes (Signed)
Initial Nutrition Assessment  DOCUMENTATION CODES:  Not applicable  INTERVENTION: - Will monitor for ability to advance diet and needs at that time  NUTRITION DIAGNOSIS:  Increased nutrient needs related to acute illness as evidenced by estimated needs.  GOAL:  Patient will meet greater than or equal to 90% of their needs  MONITOR:  Diet advancement, Weight trends, Labs, I & O's  REASON FOR ASSESSMENT:  Consult, Malnutrition Screening Tool Assessment of nutrition requirement/status  ASSESSMENT: Per H&P, h/o catatonia, encephalopathy, progressive neurodegenerative decline of unclear diagnosis, indwelling foley, chronic quadriplegia, contracture, sent from home to ER due to c/o sob for the last three days.  Pt seen for consult for assessment and MST. BMI indicates normal weight status. Pt has been nonverbal since admission and no family present. Pt NPO since admission and no SLP notes at this time. Will monitor for diet advancement versus need for nutrition support.   Unable to meet needs at this time. Physical assessment did not show muscle or fat wasting to upper body but legs unable to be assessed due to boots. Per weight hx review, pt has lost 6 lbs (5% body weight) in 3 weeks which is significant for time frame. Medications reviewed. Labs reviewed; CL: 95 mmol/L.  Height:  Ht Readings from Last 1 Encounters:  10/24/14 5\' 5"  (1.651 m)    Weight:  Wt Readings from Last 1 Encounters:  10/24/14 123 lb 7.3 oz (56 kg)    Ideal Body Weight:  56.8 kg (kg)  Wt Readings from Last 10 Encounters:  10/24/14 123 lb 7.3 oz (56 kg)  10/03/14 129 lb 3 oz (58.6 kg)  12/04/13 164 lb 10.9 oz (74.7 kg)  08/27/13 153 lb 11.2 oz (69.718 kg)  07/30/13 181 lb 14.1 oz (82.5 kg)  07/24/13 170 lb 11.2 oz (77.429 kg)  07/16/13 182 lb 12.2 oz (82.9 kg)  05/17/13 154 lb 12.2 oz (70.2 kg)    BMI:  Body mass index is 20.54 kg/(m^2).  Estimated Nutritional Needs:  Kcal:   1400-1600  Protein:  65-80 grams  Fluid:  2.2-2.5 L/day  Skin:  Wound (see comment) (stage 2 pressure ulcer to sacrum, bilateral breast wounds)  Diet Order:  Diet NPO time specified  EDUCATION NEEDS:  No education needs identified at this time.   Intake/Output Summary (Last 24 hours) at 10/24/14 1204 Last data filed at 10/24/14 0700  Gross per 24 hour  Intake 1402.5 ml  Output      0 ml  Net 1402.5 ml    Last BM:  PTA   Trenton GammonJessica Yuya Vanwingerden, RD, LDN Inpatient Clinical Dietitian Pager # [phone] After hours/weekend pager # [phone]

## 2014-10-24 NOTE — Progress Notes (Signed)
[facility]  Patient Status: Active (receiving services up to time of hospitalization)  [facility] is providing the following services: RN, PT, OT, MSW and HHA  If patient discharges after hours, please call [phone].   Lanae CrumblyKristen Hayworth 10/24/2014, 10:01 AM

## 2014-10-25 LAB — URINE CULTURE: Colony Count: >=100000

## 2014-10-25 MED ORDER — SODIUM CHLORIDE 0.9 % IV BOLUS (SEPSIS)
250.0000 mL | Freq: Once | INTRAVENOUS | Status: AC
  Administered 2014-10-25: 250 mL via INTRAVENOUS

## 2014-10-25 NOTE — Progress Notes (Signed)
Patient had not voided at all for night shift.  Perfomed bladder scan which showed 124 ml.  Notified NP on call.  New order recieved to give a NS bolus.  Will continue to closely monitor patient.

## 2014-10-25 NOTE — Progress Notes (Addendum)
Patient ID: Joan [name], female   DOB: 10/20/1960, 54 y.o.   MRN: [id number]  [facility] PROGRESS NOTE  Joan [name] [id number] DOB: 07/09/1960 DOA: 10/23/2014 PCP: Angela Coxasanayaka, [name] Y, MD   Brief narrative:    Patient is 54 year old female with catatonia, progressive neurodegenerative decline of unclear diagnosis, followed by [facility], on chronic steroids and indwelling Foley catheter, chronic quadriplegia with contractures, brought from home to emergency department with main concern of several days duration of progressively worsening dyspnea at rest and with minimal exertion. Please note that patient is nonverbal and unable to provide any history. Family was not available in the emergency department when patient was admitted. No reported chest pain, fevers or chills.  In emergency department patient noted to be hemodynamically stable, vital signs notable for HR 130, RR 30, oxygen saturation 77% on room air. Blood work notable for platelets 50 2K, potassium 2.7, CO2 = 44. [facility] asked to admit for further evaluation of acute hypoxic respiratory failure.  Assessment/Plan:    Active Problems: Acute hypoxic respiratory failure - Unclear etiology at this time, chest x-ray with no clear acute cardiopulmonary findings - Patient is currently hemodynamically stable, maintaining oxygen saturations above 96% on room air, no signs of respiratory distress - Provide analgesia as needed, CT chest pending to rule out PE/PNA  Questionable UTI - Suggested by urinalysis, possibly secondary to chronic indwelling Foley catheter - Patient was started on Rocephin, will continue day # 3 - Follow-up on urine cultures, final report is still pending but preliminary report with E. Coli   Hypokalemia - Supplemented and within normal limits this morning - Repeat BMP in the morning  Thrombocytopenia - B12, folate, HRT panel pending - No signs of active bleeding - Repeat CBC in the  morning  Chronic debility with quadriplegia, severe contractures - Patient nonverbal, currently nothing by mouth, swallow evaluation. - We'll discuss with family palliative care consult  Moderate protein calorie malnutrition - In the setting of progressive decline and failure to thrive - SLP requested, nutrition is consulted as well - dys I diet recommended   Acute renal failure - Secondary to prerenal etiology, provided IV fluids and Cr is now WNL  Anemia of chronic disease, macrocytic anemia - Vitamin B-12, folate pending - Drop in hemoglobin since admission likely from IV fluids and dilutional component - No signs of active bleeding, repeat CBC in AM  DVT prophylaxis - SCDs  Code Status: Full.  Family Communication:  No family at bedside  Disposition Plan: Not ready for d/c at this time   IV access:  Peripheral IV  Procedures and diagnostic studies:     [facility] 10/23/2014   No edema or consolidation.   Medical Consultants:  None  Other Consultants:  Nutritionist SLP  IAnti-Infectives:   Rocephin 10/23/2014 -->  Debbora PrestoMAGICK-Kashius Dominic, MD  [facility] Pager [phone]  If 7PM-7AM, please contact night-coverage [email or web address] Password [email or web address] 10/25/2014, 2:08 PM   LOS: 2 days   HPI/Subjective: No events overnight. Pt is non verbal   Objective: Filed Vitals:   10/24/14 1426 10/24/14 2057 10/25/14 0612 10/25/14 1402  BP: 97/42 111/83 135/82 100/71  Pulse: 90 164 108 78  Temp: 97.7 F (36.5 C) 97.6 F (36.4 C) 98 F (36.7 C) 98.3 F (36.8 C)  TempSrc: Axillary Axillary Axillary Axillary  Resp: 18  16 16   Height:      Weight:      SpO2: 99% 100% 93% 90%    Intake/Output  Summary (Last 24 hours) at 10/25/14 1408 Last data filed at 10/25/14 0655  Gross per 24 hour  Intake 2061.25 ml  Output      0 ml  Net 2061.25 ml    Exam:   General:  Pt is alert, non verbal, NAD  Cardiovascular: Regular rate and rhythm, no rubs, no gallops  Respiratory:  Clear to auscultation bilaterally, no wheezing, diminished breath sounds at bases   Abdomen: Soft, non tender, non distended, bowel sounds present, no guarding  Extremities: contracted, quadriplegic   Data Reviewed: Basic Metabolic Panel:  Recent Labs Lab 10/23/14 0809 10/23/14 0812 10/24/14 0550  NA 140 144 144  K 2.7* 2.7* 3.7  CL 85* 86* 95*  CO2  --  44* 39*  GLUCOSE 82 85 147*  BUN CREATININE 1.20* 0.78 0.63  CALCIUM  --  8.9 8.2*   Liver Function Tests:  Recent Labs Lab 10/23/14 0812 10/24/14 0550  AST 50* 34  ALT 23 19  ALKPHOS 68 51  BILITOT 1.2 0.8  PROT 5.4* 4.4*  ALBUMIN 3.0* 2.3*   CBC:  Recent Labs Lab 10/23/14 0809 10/23/14 0812 10/24/14 0550  WBC  --  3.8* 2.5*  NEUTROABS  --  1.5*  --   HGB 13.3 12.4 10.7*  HCT 39.0 39.6 33.9*  MCV  --  112.5* 113.0*  PLT  --  52* 51*   Scheduled Meds: . antiseptic oral rinse  7 mL Mouth Rinse BID  . cefTRIAXone (ROCEPHIN)  IV  1 g Intravenous Q24H  . methylPREDNISolone (SOLU-MEDROL) injection  60 mg Intravenous Q24H  . valproate sodium  750 mg Intravenous Q8H   Continuous Infusions: . dextrose 5 % and 0.9 % NaCl with KCl 20 mEq/L 75 mL/hr at 10/25/14 0227

## 2014-10-25 NOTE — Progress Notes (Signed)
PT Cancellation Note  Patient Details Name: Joan [name] MRN: [id number] DOB: 08/31/1960   Cancelled Treatment:    Reason Eval/Treat Not Completed: PT screened, no needs identified, will sign off, patient has  Quadriparesis with contractures of long standing duration.   Joan [name], Joan [name] 10/25/2014, 7:19 AM Joan [name] PT [phone]

## 2014-10-26 LAB — BASIC METABOLIC PANEL
ANION GAP: 7 (ref 5–15)
BUN: 7 mg/dL (ref 6–20)
CO2: 32 mmol/L (ref 22–32)
CREATININE: 0.52 mg/dL (ref 0.44–1.00)
Calcium: 8.2 mg/dL — ABNORMAL LOW (ref 8.9–10.3)
Chloride: 107 mmol/L (ref 101–111)
GFR calc non Af Amer: >60 mL/min (ref >60)
Glucose, Bld: 118 mg/dL — ABNORMAL HIGH (ref 65–99)
POTASSIUM: 5.4 mmol/L — AB (ref 3.5–5.1)
Sodium: 146 mmol/L — ABNORMAL HIGH (ref 135–145)

## 2014-10-26 LAB — CBC
HCT: 33.1 % — ABNORMAL LOW (ref 36.0–46.0)
HEMOGLOBIN: 10.3 g/dL — AB (ref 12.0–15.0)
MCH: 36.4 pg — ABNORMAL HIGH (ref 26.0–34.0)
MCHC: 31.1 g/dL (ref 30.0–36.0)
MCV: 117 fL — AB (ref 78.0–100.0)
Platelets: 40 10*3/uL — ABNORMAL LOW (ref 150–400)
RBC: 2.83 MIL/uL — ABNORMAL LOW (ref 3.87–5.11)
RDW: 20.9 % — ABNORMAL HIGH (ref 11.5–15.5)
WBC: 3.1 10*3/uL — AB (ref 4.0–10.5)

## 2014-10-26 LAB — CLOSTRIDIUM DIFFICILE BY PCR: CDIFFPCR: NEGATIVE

## 2014-10-26 MED ORDER — SODIUM CHLORIDE 0.9 % IV SOLN
INTRAVENOUS | Status: DC
Start: 2014-10-26 — End: 2014-10-27
  Administered 2014-10-26: 11:00:00 via INTRAVENOUS

## 2014-10-26 MED ORDER — METHYLPREDNISOLONE SODIUM SUCC 40 MG IJ SOLR
40.0000 mg | INTRAMUSCULAR | Status: DC
  Administered 2014-10-26 – 2014-10-28 (×3): 40 mg via INTRAVENOUS
  Filled 2014-10-26 (×3): qty 1

## 2014-10-26 NOTE — Progress Notes (Addendum)
Patient ID: Joan [name], female   DOB: [birth year], 54 y.o.   MRN: [id number]  [facility] PROGRESS NOTE  Joan [name] [id number] DOB: January 17, 1961 DOA: 10/23/2014 PCP: [name], MD   Brief narrative:    Patient is 54 year old female with catatonia, progressive neurodegenerative decline of unclear diagnosis, followed by [facility], on chronic steroids and indwelling Foley catheter, chronic quadriplegia with contractures, brought from home to emergency department with main concern of several days duration of progressively worsening dyspnea at rest and with minimal exertion. Please note that patient is nonverbal and unable to provide any history. Family was not available in the emergency department when patient was admitted. No reported chest pain, fevers or chills.  In emergency department patient noted to be hemodynamically stable, vital signs notable for HR 130, RR 30, oxygen saturation 77% on room air. Blood work notable for platelets 50 2K, potassium 2.7, CO2 = 44. TR[facility] asked to admit for further evaluation of acute hypoxic respiratory failure.  Assessment/Plan:    Active Problems: Acute hypoxic respiratory failure - Unclear etiology at this time, chest x-ray with no clear acute cardiopulmonary findings - Patient is currently hemodynamically stable, maintaining oxygen saturations above 96% on room air - no signs of respiratory distress - Provide analgesia as needed, CT chest requested but still not done, follow up on this   UTI - Suggested by urinalysis, possibly secondary to chronic indwelling Foley catheter - Patient was started on Rocephin, will continue day # 4 - Follow-up on urine cultures, final report is still pending but preliminary report with E. Coli   Hypokalemia --> hyperkalemia - Supplemented and now above target range - stop supplementing and repeat BMP in AM - Repeat BMP in the morning  Hypernatremia - from poor oral intake -  continue IVF and repeat BMP in AM  Thrombocytopenia - B12, folate, HRT panel pending - No signs of active bleeding - Repeat CBC in the morning  Chronic debility with quadriplegia, severe contractures - Patient nonverbal, currently nothing by mouth, swallow evaluation. - PCT consultation recommended, family to think about it   Moderate protein calorie malnutrition - In the setting of progressive decline and failure to thrive - SLP requested, nutrition is consulted as well - dys I diet recommended   Acute renal failure - Secondary to prerenal etiology, provided IV fluids and Cr is now WNL  Anemia of chronic disease, macrocytic anemia - Vitamin B-12, folate pending - Drop in hemoglobin since admission likely from IV fluids and dilutional component - overall Hg stable in the past 24 hours  - No signs of active bleeding, repeat CBC in AM  DVT prophylaxis - SCDs  Code Status: Full.  Family Communication:  No family at bedside  Disposition Plan: Not ready for d/c at this time   IV access:  Peripheral IV  Procedures and diagnostic studies:     Dg Chest [facility] 10/23/2014   No edema or consolidation.   Medical Consultants:  None  Other Consultants:  Nutritionist SLP  IAnti-Infectives:   Rocephin 10/23/2014 -->  Debbora Presto, MD  [facility] Pager [phone]  If 7PM-7AM, please contact night-coverage [email or web address] Password [facility] 10/26/2014, 10:55 AM   LOS: 3 days   HPI/Subjective: No events overnight. Pt is non verbal   Objective: Filed Vitals:   10/25/14 0612 10/25/14 1402 10/25/14 2109 10/26/14 0401  BP: 135/82 100/71 125/94 119/62  Pulse: 108 78 83 80  Temp: 98 F (36.7 C) 98.3 F (36.8 C) 98  F (36.7 C) 97.5 F (36.4 C)  TempSrc: Axillary Axillary Oral Oral  Resp: 16 16 20 20   Height:      Weight:      SpO2: 93% 90% 90% 98%    Intake/Output Summary (Last 24 hours) at 10/26/14 1055 Last data filed at 10/26/14 0700  Gross per 24 hour  Intake  2028.75 ml  Output    375 ml  Net 1653.75 ml    Exam:   General:  Pt is alert, non verbal, NAD  Cardiovascular: Regular rate and rhythm, no rubs, no gallops  Respiratory: Clear to auscultation bilaterally, no wheezing, diminished breath sounds at bases   Abdomen: Soft, non tender, non distended, bowel sounds present, no guarding  Extremities: contracted, quadriplegic   Data Reviewed: Basic Metabolic Panel:  Recent Labs Lab 10/23/14 0809 10/23/14 0812 10/24/14 0550 10/26/14 0730  NA 140 144 144 146*  K 2.7* 2.7* 3.7 5.4*  CL 85* 86* 95* 107  CO2  --  44* 39* 32  GLUCOSE 82 85 147* 118*  BUN 7 8 8 7   CREATININE 1.20* 0.78 0.63 0.52  CALCIUM  --  8.9 8.2* 8.2*   Liver Function Tests:  Recent Labs Lab 10/23/14 0812 10/24/14 0550  AST 50* 34  ALT 23 19  ALKPHOS 68 51  BILITOT 1.2 0.8  PROT 5.4* 4.4*  ALBUMIN 3.0* 2.3*   CBC:  Recent Labs Lab 10/23/14 0809 10/23/14 0812 10/24/14 0550 10/26/14 0823  WBC  --  3.8* 2.5* 3.1*  NEUTROABS  --  1.5*  --   --   HGB 13.3 12.4 10.7* 10.3*  HCT 39.0 39.6 33.9* 33.1*  MCV  --  112.5* 113.0* 117.0*  PLT  --  52* 51* 40*   Scheduled Meds: . antiseptic oral rinse  7 mL Mouth Rinse BID  . cefTRIAXone (ROCEPHIN)  IV  1 g Intravenous Q24H  . methylPREDNISolone (SOLU-MEDROL) injection  60 mg Intravenous Q24H  . valproate sodium  750 mg Intravenous Q8H   Continuous Infusions: . dextrose 5 % and 0.9 % NaCl with KCl 20 mEq/L 75 mL/hr at 10/25/14 1658

## 2014-10-27 LAB — BASIC METABOLIC PANEL
Anion gap: 8 (ref 5–15)
BUN: 8 mg/dL (ref 6–20)
CO2: 34 mmol/L — ABNORMAL HIGH (ref 22–32)
Calcium: 8.5 mg/dL — ABNORMAL LOW (ref 8.9–10.3)
Chloride: 108 mmol/L (ref 101–111)
Creatinine, Ser: 0.47 mg/dL (ref 0.44–1.00)
GFR calc Af Amer: >60 mL/min (ref >60)
GFR calc non Af Amer: >60 mL/min (ref >60)
Glucose, Bld: 87 mg/dL (ref 65–99)
Potassium: 5.2 mmol/L — ABNORMAL HIGH (ref 3.5–5.1)
Sodium: 150 mmol/L — ABNORMAL HIGH (ref 135–145)

## 2014-10-27 LAB — CBC
HCT: 32.1 % — ABNORMAL LOW (ref 36.0–46.0)
Hemoglobin: 9.9 g/dL — ABNORMAL LOW (ref 12.0–15.0)
MCH: 36.3 pg — AB (ref 26.0–34.0)
MCHC: 30.8 g/dL (ref 30.0–36.0)
MCV: 117.6 fL — ABNORMAL HIGH (ref 78.0–100.0)
Platelets: 39 10*3/uL — ABNORMAL LOW (ref 150–400)
RBC: 2.73 MIL/uL — AB (ref 3.87–5.11)
RDW: 20.6 % — ABNORMAL HIGH (ref 11.5–15.5)
WBC: 2.8 10*3/uL — AB (ref 4.0–10.5)

## 2014-10-27 MED ORDER — DEXTROSE 5 % IV SOLN
INTRAVENOUS | Status: DC
  Administered 2014-10-27 – 2014-10-28 (×2): via INTRAVENOUS

## 2014-10-27 NOTE — Progress Notes (Signed)
Patient has been non verbal throughout admission with occasional moaning. Patient was moaning, RN asked patient if she was in pain. Patient verbally replied "yes". When asked where she replied "on my butt". RN and NT repositioned patient and RN administered pain medication.  Earnest ConroyBrooke M. Clelia CroftShaw, RN

## 2014-10-27 NOTE — Clinical Documentation Improvement (Signed)
Query #1 Most current nutritional diagnosis per physician progress notes is "moderate protein calorie malnutrition"  RD assessment dated 10/24/14 at 12:09 by Jarome Matin records no applicable nutritional diagnosis.  Please document physical assessment findings and other supporting information to validate the diagnosis of "moderate protein malnutrition".  Query #2 "Acute renal failure" is documented in the current medical record.  The initial BUN/Cr. Results were from a I-Stat Chem 8 results at 8:09 am.  The next BUN/Cr. results were from [place] results 3 minutes later at 8:12 am.  Please clarify and document if the diagnosis of "Acute Renal Failure" is applicable to this admission. Component     Latest Ref Rng 10/23/2014 10/23/2014         8:09 AM  8:12 AM  BUN 6 - 20 mg/dL 7 8  Creatinine     0.44 - 1.00 mg/dL 1.20 (H) 0.78  EGFR (African American)     >60 mL/min  >60    Query #3 WOC Nurse consult note dated 10/23/14 at 7:43 pm reports intertriginous dermatitis of the inframammary areas and incontinence associated dermatitis.  Interventions and treatments include low air loss feature mattress, Prevalon boots, incontinence products and anitmicrobial textile.  Please document if you agree with the [place] nurse's assessment and plan of treatment.  Thank You, Erling Conte ,RN Clinical Documentation Specialist:  [phone]. [place]- Health Information Management

## 2014-10-27 NOTE — Progress Notes (Addendum)
Patient ID: Joan [name], female   DOB: May 05, 1961, 54 y.o.   MRN: [id number]  [facility] PROGRESS NOTE  Joan [name] DOB: March 16, 1961 DOA: 10/23/2014 PCP: Joan Cox, MD   Brief narrative:    Patient is 54 year old female with catatonia, progressive neurodegenerative decline of unclear diagnosis, followed by [facility], on chronic steroids and indwelling Foley catheter, chronic quadriplegia with contractures, brought from home to emergency department with main concern of several days duration of progressively worsening dyspnea at rest and with minimal exertion. Please note that patient is nonverbal and unable to provide any history. Family was not available in the emergency department when patient was admitted. No reported chest pain, fevers or chills.  In emergency department patient noted to be hemodynamically stable, vital signs notable for HR 130, RR 30, oxygen saturation 77% on room air. Blood work notable for platelets 50 2K, potassium 2.7, CO2 = 44. TR[facility] asked to admit for further evaluation of acute hypoxic respiratory failure.  Assessment/Plan:    Active Problems: Acute hypoxic respiratory failure - Unclear etiology at this time, chest x-ray with no clear acute cardiopulmonary findings - Patient is currently hemodynamically stable, maintaining oxygen saturations above 96% on room air - no signs of respiratory distress - Provide analgesia as needed  UTI - Suggested by urinalysis, possibly secondary to chronic indwelling Foley catheter - Patient was started on Rocephin, will continue day # 5/7 - urine culture with E. Coli and sensitive to Rocephin, unfortunately unable to place on PO as the ABX pt is sensitive to per report, pt is allergic to, so will have to continue Rocephin   Hypokalemia --> hyperkalemia - Supplemented and now above target range - stopped supplementing, K is trending down  - Repeat BMP in the  morning  Hypernatremia - from poor oral intake - continue IVF, change to D5 as Na is trending up  - repeat BMP in AM  Thrombocytopenia - Plt slightly down - No signs of active bleeding - Repeat CBC in the morning  Chronic debility with quadriplegia, severe contractures - Patient nonverbal, currently nothing by mouth, swallow evaluation. - PCT consultation recommended, family to think about it   Moderate protein calorie malnutrition - In the setting of progressive decline and failure to thrive - SLP requested, nutrition is consulted as well - please note that pt albumin level continues to decline and was 2.3 on may 27th, 2016 - in addition weight loss of 9 lbs noted in the past one month based on EPIC note review (last weight in 09/28/2013 was 132 which family confirmed is baseline weight and pt is currently 123 lbs) - dys I diet recommended, pt noted to be eating < 50% of her meals   Acute renal failure, ? - initial blood work with elevated Cr and was WNL several minutes later, unclear which blood test is the blood work error - given hypernatremia, pre renal etiology is certainly possible but again it is unclear which Cr is the correct one - Cr has been stable with IVF provided   Anemia of chronic disease, macrocytic anemia - Vitamin B-12, folate pending - Drop in hemoglobin since admission likely from IV fluids and dilutional component - overall Hg stable in the past 24 hours  - No signs of active bleeding, repeat CBC in AM  Intertriginous dermatitis of the inframammary areas and incontinence associated dermatitis - keep air mattress for pt - prevalon boots - provide incontinence products and antimicrobial textile  DVT prophylaxis -  SCDs  Code Status: Full.  Family Communication:  No family at bedside  Disposition Plan: Not ready for d/c at this time as Na is trending up   IV access:  Peripheral IV  Procedures and diagnostic studies:     Dg Chest Port 1  View 10/23/2014   No edema or consolidation.   Medical Consultants:  None  Other Consultants:  Nutritionist SLP  IAnti-Infectives:   Rocephin 10/23/2014 -->  Joan PrestoMAGICK-Joan Matherly, MD  [facility] Pager [phone]  If 7PM-7AM, please contact night-coverage [email or web address] Password [email or web address] 10/27/2014, 2:43 PM   LOS: 4 days   HPI/Subjective: No events overnight. Pt is non verbal   Objective: Filed Vitals:   10/26/14 1433 10/26/14 2100 10/27/14 0500 10/27/14 1419  BP: 109/34 143/40 121/73 134/54  Pulse: 101 112 76 95  Temp: 97.3 F (36.3 C) 98.2 F (36.8 C) 97.5 F (36.4 C) 97.8 F (36.6 C)  TempSrc: Oral Axillary Axillary Oral  Resp: 20 20 20 20   Height:      Weight:      SpO2: 95% 100% 90% 100%    Intake/Output Summary (Last 24 hours) at 10/27/14 1443 Last data filed at 10/27/14 0700  Gross per 24 hour  Intake 1110.83 ml  Output    525 ml  Net 585.83 ml    Exam:   General:  Pt is alert, non verbal, NAD  Cardiovascular: Regular rate and rhythm, no rubs, no gallops  Respiratory: Clear to auscultation bilaterally, no wheezing, diminished breath sounds at bases   Abdomen: Soft, non tender, non distended, bowel sounds present, no guarding  Extremities: contracted, quadriplegic   Data Reviewed: Basic Metabolic Panel:  Recent Labs Lab 10/23/14 0809 10/23/14 0812 10/24/14 0550 10/26/14 0730 10/27/14 0408  NA 140 144 144 146* 150*  K 2.7* 2.7* 3.7 5.4* 5.2*  CL 85* 86* 95* 107 108  CO2  --  44* 39* 32 34*  GLUCOSE 82 85 147* 118* 87  BUN 7 8 8 7 8   CREATININE 1.20* 0.78 0.63 0.52 0.47  CALCIUM  --  8.9 8.2* 8.2* 8.5*   Liver Function Tests:  Recent Labs Lab 10/23/14 0812 10/24/14 0550  AST 50* 34  ALT 23 19  ALKPHOS 68 51  BILITOT 1.2 0.8  PROT 5.4* 4.4*  ALBUMIN 3.0* 2.3*   CBC:  Recent Labs Lab 10/23/14 0809 10/23/14 0812 10/24/14 0550 10/26/14 0823 10/27/14 0408  WBC  --  3.8* 2.5* 3.1* 2.8*  NEUTROABS  --  1.5*  --   --   --   HGB  13.3 12.4 10.7* 10.3* 9.9*  HCT 39.0 39.6 33.9* 33.1* 32.1*  MCV  --  112.5* 113.0* 117.0* 117.6*  PLT  --  52* 51* 40* 39*   Scheduled Meds: . antiseptic oral rinse  7 mL Mouth Rinse BID  . cefTRIAXone (ROCEPHIN)  IV  1 g Intravenous Q24H  . methylPREDNISolone (SOLU-MEDROL) injection  40 mg Intravenous Q24H  . valproate sodium  750 mg Intravenous Q8H   Continuous Infusions: . sodium chloride 50 mL/hr at 10/26/14 1105

## 2014-10-28 LAB — CBC
HEMATOCRIT: 26.5 % — AB (ref 36.0–46.0)
Hemoglobin: 8.4 g/dL — ABNORMAL LOW (ref 12.0–15.0)
MCH: 36.4 pg — ABNORMAL HIGH (ref 26.0–34.0)
MCHC: 31.7 g/dL (ref 30.0–36.0)
MCV: 114.7 fL — ABNORMAL HIGH (ref 78.0–100.0)
Platelets: 30 10*3/uL — ABNORMAL LOW (ref 150–400)
RBC: 2.31 MIL/uL — AB (ref 3.87–5.11)
RDW: 19.4 % — ABNORMAL HIGH (ref 11.5–15.5)
WBC: 2.3 10*3/uL — ABNORMAL LOW (ref 4.0–10.5)

## 2014-10-28 LAB — BASIC METABOLIC PANEL
Anion gap: 5 (ref 5–15)
BUN: 8 mg/dL (ref 6–20)
CHLORIDE: 105 mmol/L (ref 101–111)
CO2: 33 mmol/L — AB (ref 22–32)
Calcium: 8 mg/dL — ABNORMAL LOW (ref 8.9–10.3)
Creatinine, Ser: 0.57 mg/dL (ref 0.44–1.00)
GFR calc Af Amer: >60 mL/min (ref >60)
GFR calc non Af Amer: >60 mL/min (ref >60)
GLUCOSE: 118 mg/dL — AB (ref 65–99)
POTASSIUM: 4.7 mmol/L (ref 3.5–5.1)
SODIUM: 143 mmol/L (ref 135–145)

## 2014-10-28 LAB — FOLATE RBC
FOLATE, RBC: 1413 ng/mL (ref >498)
Folate, Hemolysate: 487.6 ng/mL
Hematocrit: 34.5 % (ref 34.0–46.6)

## 2014-10-28 LAB — VITAMIN B1: Vitamin B1 (Thiamine): 282.7 nmol/L — ABNORMAL HIGH (ref 66.5–200.0)

## 2014-10-28 MED ORDER — PREDNISONE 10 MG PO TABS
60.0000 mg | ORAL_TABLET | Freq: Every day | ORAL | Status: DC
  Administered 2014-10-29 – 2014-10-30 (×2): 60 mg via ORAL
  Filled 2014-10-28 (×3): qty 1

## 2014-10-28 NOTE — Care Management Note (Signed)
Case Management Note  Patient Details  Name: Candida PeelingLora Fehrenbach MRN: [id number] Date of Birth: 10/15/1960  Subjective/Objective:    For palliative cons.Currently active w/[facility].                Action/Plan:Continue to monitor for d/c plans.   Expected Discharge Date:                  Expected Discharge Plan:  Home w Home Health Services  In-House Referral:  Clinical Social Work  Discharge planning Services  CM Consult  Post Acute Care Choice:  Home Health (Active w/[facility]-HHRN/HHPT/HHOT/HH [facility]'s adide/HH Child psychotherapistsocial worker) Choice offered to:     DME Arranged:    DME Agency:     HH Arranged:    HH Agency:     Status of Service:  In process, will continue to follow  Medicare Important Message Given:  Yes Date Medicare IM Given:  10/28/14 Medicare IM give by:  Lanier ClamMahabir, Amberia Bayless Date Additional Medicare IM Given:    Additional Medicare Important Message give by:     If discussed at Long Length of Stay Meetings, dates discussed:  10/28/14  Additional Comments:  Lanier ClamMahabir, Joniah Bednarski, RN 10/28/2014, 12:16 PM

## 2014-10-28 NOTE — Progress Notes (Addendum)
Patient ID: Joan [name], female   DOB: 10/24/1960, 54 y.o.   MRN: [id number]  [facility] PROGRESS NOTE  Joan [name] JYN:[id number] DOB: [birth year] DOA: 10/23/2014 PCP: [name], MD   Brief narrative:    Patient is 54 year old female with catatonia, progressive neurodegenerative decline of unclear diagnosis, followed by [facility], on chronic steroids and indwelling Foley catheter, chronic quadriplegia with contractures, brought from home to emergency department with main concern of several days duration of progressively worsening dyspnea at rest and with minimal exertion. Please note that patient is nonverbal and unable to provide any history. Family was not available in the emergency department when patient was admitted. No reported chest pain, fevers or chills.  In emergency department patient noted to be hemodynamically stable, vital signs notable for HR 130, RR 30, oxygen saturation 77% on room air. Blood work notable for platelets 50 2K, potassium 2.7, CO2 = 44. TRH asked to admit for further evaluation of acute hypoxic respiratory failure.  Hospital course prolonged as urine culture positive for E. Coli and sensitive to PO ABX that pt is allergic to and Rocephin IV that pt has been started on is appropriate choice based on urine culture and sensitivity report and pt needs total 7 days therapy. Pt started on Rocephin IV May 26th, 2016.   Assessment/Plan:    Active Problems: Acute hypoxic respiratory failure - Unclear etiology, chest x-ray with no clear acute cardiopulmonary findings - Patient is currently hemodynamically stable, maintaining oxygen saturations above 96% on room air - CT angio of the chest was initially requested but never done as pt lost IV access - when IV access established, pt no longer symptomatic and with stable oxygen saturations - no signs of respiratory distress over 72 hours  - also given pt's thrombocytopenia, I discussed with  son potential bleeding if started on [facility], he agreed to monitoring with no AC and holding off on further imaging for now unless pt deteriorates clinically   UTI - Suggested by urinalysis, possibly secondary to chronic indwelling Foley catheter - Patient was started on Rocephin, will continue day # 6/7 - urine culture with E. Coli and sensitive to Rocephin, unfortunately unable to place on PO as the ABX pt is sensitive to per report, pt is allergic to, so will have to continue Rocephin ntil June 1st, 2016  Hypokalemia --> supplemented and pt with subsequent hyperkalemia - K is now WNL  - stopped supplementing - Repeat BMP in the morning  Hypernatremia - from poor oral intake - IVF provided, D5 at 75 cc/hr, Na is now WNL  - repeat BMP in AM  Thrombocytopenia - Plt slightly down, will ask for peripheral smear  - No signs of active bleeding - Repeat CBC in the morning  Chronic debility with quadriplegia, severe contractures - Patient nonverbal, SLP requested and Dys I diet recommended, pt tolerating well but eating < 50% of her meals  - PCT consultation requested, son was agreeable to discuss recommendations  - on chronic low dose steroids, was initially on solumedrol but will transition to home medical regimen this AM 5/31  Moderate protein calorie malnutrition - In the setting of progressive decline and failure to thrive - SLP requested, nutrition is consulted as well - please note that pt albumin level continues to decline and was 2.3 on may 27th, 2016 - in addition weight loss of 9 lbs noted in the past one month based on EPIC note review (last weight in 09/28/2013 was 132  which family confirmed is baseline weight and pt is currently 123 lbs) - dys I diet recommended, pt noted to be eating < 50% of her meals   Acute renal failure, ? - initial blood work with elevated Cr and was WNL several minutes later, unclear which blood test is the blood work error - given hypernatremia, pre renal  etiology is certainly possible but again it is unclear which Cr is the correct one - Cr has been stable with IVF provided   Anemia of chronic disease, macrocytic anemia - Vitamin B-12, folate at target range  - Drop in hemoglobin since admission likely from IV fluids and dilutional component - no signs of active bleeding, will check FOBT - transfuse if Hg < 7.5 - repeat CBC in AM  Intertriginous dermatitis of the inframammary areas and incontinence associated dermatitis - keep air mattress for pt - prevalon boots - provide incontinence products and antimicrobial textile  HTN - reasonable inpatient control - please note that pt;s home regimen includes Lasix and Hydralazine but son explained that he has not been giving pt any blood pressure meds as he noted her BP get two low with the medications - both medications held since admission but could be potentially resumed perhaps just lasix   DVT prophylaxis - SCDs  Code Status: Full.  Family Communication:  Discussed plan of care with son in detail over 30 minutes, explained pt is ready for d/c 6/1 upon completion of IV ABX and provided Hg is stable  Disposition Plan: D/C 10/29/14 if Hg stable and upon completion of IV Rocephin, son wants to take mom home, recommended considering SNF and he will talk to rest of the family   IV access:  Peripheral IV  Procedures and diagnostic studies:     Dg Chest Port 1 View 10/23/2014   No edema or consolidation.   Medical Consultants:  None  Other Consultants:  Nutritionist SLP  IAnti-Infectives:   Rocephin 10/23/2014 --> 10/29/14  Joan PrestoMAGICK-Edit Ricciardelli, MD  [facility] Pager [phone]  If 7PM-7AM, please contact night-coverage [email or web address] Password [email or web address] 10/28/2014, 4:46 PM   LOS: 5 days   HPI/Subjective: No events overnight. Pt is non verbal.  Objective: Filed Vitals:   10/27/14 1419 10/27/14 2035 10/28/14 0628 10/28/14 1455  BP: 134/54 128/78 118/54 131/68  Pulse: 95 110 70 97  Temp:  97.8 F (36.6 C) 99.1 F (37.3 C) 97.8 F (36.6 C) 97.8 F (36.6 C)  TempSrc: Oral Oral Oral Oral  Resp: 20 20 20 20   Height:      Weight:      SpO2: 100% 100% 100% 100%    Intake/Output Summary (Last 24 hours) at 10/28/14 1646 Last data filed at 10/28/14 1500  Gross per 24 hour  Intake 1957.5 ml  Output    895 ml  Net 1062.5 ml    Exam:   General:  Pt is alert, non verbal, NAD  Cardiovascular: Regular rate and rhythm, no rubs, no gallops  Respiratory: Clear to auscultation bilaterally, no wheezing, diminished breath sounds at bases   Abdomen: Soft, non tender, non distended, bowel sounds present, no guarding  Extremities: contracted, quadriplegic, trace bilateral LE edema   Data Reviewed: Basic Metabolic Panel:  Recent Labs Lab 10/23/14 0812 10/24/14 0550 10/26/14 0730 10/27/14 0408 10/28/14 0420  NA 144 144 146* 150* 143  K 2.7* 3.7 5.4* 5.2* 4.7  CL 86* 95* 107 108 105  CO2 44* 39* 32 34* 33*  GLUCOSE 85 147* 118* 87 118*  BUN CREATININE 0.78 0.63 0.52 0.47 0.57  CALCIUM 8.9 8.2* 8.2* 8.5* 8.0*   Liver Function Tests:  Recent Labs Lab 10/23/14 0812 10/24/14 0550  AST 50* 34  ALT 23 19  ALKPHOS 68 51  BILITOT 1.2 0.8  PROT 5.4* 4.4*  ALBUMIN 3.0* 2.3*   CBC:  Recent Labs Lab 10/23/14 0812 10/24/14 0550 10/24/14 0551 10/26/14 0823 10/27/14 0408 10/28/14 0420  WBC 3.8* 2.5*  --  3.1* 2.8* 2.3*  NEUTROABS 1.5*  --   --   --   --   --   HGB 12.4 10.7*  --  10.3* 9.9* 8.4*  HCT 39.6 33.9* 34.5 33.1* 32.1* 26.5*  MCV 112.5* 113.0*  --  117.0* 117.6* 114.7*  PLT 52* 51*  --  40* 39* 30*   Scheduled Meds: . antiseptic oral rinse  7 mL Mouth Rinse BID  . cefTRIAXone (ROCEPHIN)  IV  1 g Intravenous Q24H  . methylPREDNISolone (SOLU-MEDROL) injection  40 mg Intravenous Q24H  . valproate sodium  750 mg Intravenous Q8H   Continuous Infusions: . dextrose 75 mL/hr at 10/28/14 0746

## 2014-10-29 DIAGNOSIS — N390 Urinary tract infection, site not specified: Secondary | ICD-10-CM

## 2014-10-29 DIAGNOSIS — D61818 Other pancytopenia: Secondary | ICD-10-CM

## 2014-10-29 DIAGNOSIS — R532 Functional quadriplegia: Secondary | ICD-10-CM

## 2014-10-29 DIAGNOSIS — Z515 Encounter for palliative care: Secondary | ICD-10-CM

## 2014-10-29 DIAGNOSIS — E440 Moderate protein-calorie malnutrition: Secondary | ICD-10-CM | POA: Insufficient documentation

## 2014-10-29 DIAGNOSIS — I10 Essential (primary) hypertension: Secondary | ICD-10-CM

## 2014-10-29 DIAGNOSIS — R630 Anorexia: Secondary | ICD-10-CM

## 2014-10-29 DIAGNOSIS — G609 Hereditary and idiopathic neuropathy, unspecified: Secondary | ICD-10-CM

## 2014-10-29 DIAGNOSIS — N179 Acute kidney failure, unspecified: Secondary | ICD-10-CM

## 2014-10-29 DIAGNOSIS — R0902 Hypoxemia: Secondary | ICD-10-CM

## 2014-10-29 LAB — CBC
HCT: 32.4 % — ABNORMAL LOW (ref 36.0–46.0)
HEMOGLOBIN: 10.2 g/dL — AB (ref 12.0–15.0)
MCH: 36.2 pg — AB (ref 26.0–34.0)
MCHC: 31.5 g/dL (ref 30.0–36.0)
MCV: 114.9 fL — AB (ref 78.0–100.0)
PLATELETS: 33 10*3/uL — AB (ref 150–400)
RBC: 2.82 MIL/uL — ABNORMAL LOW (ref 3.87–5.11)
RDW: 18.6 % — ABNORMAL HIGH (ref 11.5–15.5)
WBC: 3.2 10*3/uL — AB (ref 4.0–10.5)

## 2014-10-29 LAB — BASIC METABOLIC PANEL
Anion gap: 8 (ref 5–15)
BUN: 7 mg/dL (ref 6–20)
CHLORIDE: 103 mmol/L (ref 101–111)
CO2: 33 mmol/L — ABNORMAL HIGH (ref 22–32)
Calcium: 8.5 mg/dL — ABNORMAL LOW (ref 8.9–10.3)
Creatinine, Ser: 0.58 mg/dL (ref 0.44–1.00)
GFR calc Af Amer: >60 mL/min (ref >60)
GFR calc non Af Amer: >60 mL/min (ref >60)
GLUCOSE: 78 mg/dL (ref 65–99)
Potassium: 5 mmol/L (ref 3.5–5.1)
Sodium: 144 mmol/L (ref 135–145)

## 2014-10-29 LAB — HIT PANEL (HEPARIN AB + SRA)
HEPARIN INDUCED PLT AB: 0.165 {OD_unit} (ref 0.000–0.400)
SRA .2 IU/mL UFH Ser-aCnc: 1 % (ref 0–20)
SRA 100IU/mL UFH Ser-aCnc: 1 % (ref 0–20)

## 2014-10-29 LAB — CLOSTRIDIUM DIFFICILE BY PCR: Toxigenic C. Difficile by PCR: NEGATIVE

## 2014-10-29 MED ORDER — BOOST / RESOURCE BREEZE PO LIQD: 1.0000 | Freq: Two times a day (BID) | ORAL | Status: DC

## 2014-10-29 NOTE — Clinical Social Work Placement (Signed)
Awaiting response back from [facility] re: bed availability.     Lincoln MaxinKelly Bellamy Judson, LCSW [facility] Clinical Social Worker cell #: [phone]    CLINICAL SOCIAL WORK PLACEMENT  NOTE  Date:  10/29/2014  Patient Details  Name: Joan [name] MRN: [id number] Date of Birth: 11/02/1960  Clinical Social Work is seeking post-discharge placement for this patient at the Skilled  Nursing Facility level of care (*CSW will initial, date and re-position this form in  chart as items are completed):  Yes   Patient/family provided with [place] [facility] Clinical Social Work Department's list of facilities offering this level of care within the geographic area requested by the patient (or if unable, by the patient's family).  Yes   Patient/family informed of their freedom to choose among providers that offer the needed level of care, that participate in Medicare, Medicaid or managed care program needed by the patient, have an available bed and are willing to accept the patient.  Yes   Patient/family informed of [facility]'s ownership interest in [facility] and [facility], as well as of the fact that they are under no obligation to receive care at these facilities.  PASRR submitted to EDS on       PASRR number received on       Existing PASRR number confirmed on 10/29/14     FL2 transmitted to all facilities in geographic area requested by pt/family on 10/29/14     FL2 transmitted to all facilities within larger geographic area on       Patient informed that his/her managed care company has contracts with or will negotiate with certain facilities, including the following:        Yes   Patient/family informed of bed offers received.  Patient chooses bed at       Physician recommends and patient chooses bed at      Patient to be transferred to   on  .  Patient to be transferred to facility by       Patient family notified on   of transfer.  Name of family member  notified:        PHYSICIAN       Additional Comment:    _______________________________________________ Arlyss RepressHarrison, Joselinne Lawal F, LCSW 10/29/2014, 3:42 PM

## 2014-10-29 NOTE — Clinical Social Work Note (Signed)
Clinical Social Work Assessment  Patient Details  Name: Joan PeelingLora Wampole MRN: [id number] Date of Birth: 07/03/1960  Date of referral:  10/29/14               Reason for consult:  Facility Placement                Permission sought to share information with:  [facility] Contact Representative Permission granted to share information::  Yes, Verbal Permission Granted  Name::        Agency::     Relationship::     Contact Information:     Housing/Transportation Living arrangements for the past 2 months:  Single Family Home Source of Information:  Adult Children Patient Interpreter Needed:  None Criminal Activity/Legal Involvement Pertinent to Current Situation/Hospitalization:  No - Comment as needed Significant Relationships:  Adult Children Lives with:  Adult Children Do you feel safe going back to the place where you live?  No Need for family participation in patient care:  Yes (Comment)  Care giving concerns:  CSW received consult from PMT NP, Yong ChannelAlicia Parker that patient's son is requesting SNF placement.    Social Worker assessment / plan:  CSW called & spoke with patient's son, [name] (ph#: [phone]) re: SNF placement, son is agreeable with plan for SNF.   Employment status:  Disabled (Comment on whether or not currently receiving Disability) Insurance information:  Medicare PT Recommendations:  Not assessed at this time Information / Referral to community resources:  Skilled Nursing Facility  Patient/Family's Response to care:  Son informed CSW that patient had been to [facility] in the past, but is requesting [facility]. CSW left message for AlbeeNikki at [facility] re: bed availability - awaiting call back.   Patient/Family's Understanding of and Emotional Response to Diagnosis, Current Treatment, and Prognosis:  Patient's son states that he feels that she would benefit from going to a SNF, though she would not be able to participate in rehab.   Emotional  Assessment Appearance:  Appears older than stated age Attitude/Demeanor/Rapport:    Affect (typically observed):  Calm, Quiet Orientation:  Oriented to Self Alcohol / Substance use:    Psych involvement (Current and /or in the community):  No (Comment)  Discharge Needs  Concerns to be addressed:    Readmission within the last 30 days:    Current discharge risk:    Barriers to Discharge:      Arlyss RepressHarrison, Pinchas Reither F, LCSW 10/29/2014, 2:36 PM

## 2014-10-29 NOTE — Progress Notes (Addendum)
Progress Note   Joan [name] ZOX:[id number] DOB: 16-Jan-1961 DOA: 10/23/2014 PCP: [name], MD   Brief Narrative:   Joan [name] is an 54 y.o. female with catatonia, progressive neurodegenerative decline of unclear diagnosis, followed by [facility], nonverbal at baseline, on chronic steroids and indwelling Foley catheter, chronic quadriplegia with contractures, who was admitted 10/23/14 with worsening dyspnea at rest and with minimal exertion and acute respiratory failure with hypoxia. Symptoms resolved without any specific intervention.  Hospital course prolonged as urine culture positive for E. Coli and sensitive to PO ABX that pt is allergic to and Rocephin IV that pt has been started on is appropriate choice based on urine culture and sensitivity report and pt needs total 7 days therapy. Pt started on Rocephin IV May 26th, 2016.   Assessment/Plan:   Principal Problem: Acute hypoxic respiratory failure - Unclear etiology, chest x-ray with no clear acute cardiopulmonary findings. - CT angio of the chest was initially requested but never done as pt lost IV access. - Once IV access established, pt no longer symptomatic and with stable oxygen saturations, so no further workup pursued. - Oxygen saturations have remained stable.  Active problems:  Diarrhea - Check stools for C. difficile.  Complicated Escherichia coli UTI - Patient has a chronic indwelling Foley catheter - She will complete her 7 day course of ceftriaxone today.  Hypokalemia --> supplemented and pt with subsequent hyperkalemia - K remains WNL, off routine supplementation.  Hypernatremia - Resolved with IV fluids.  Thrombocytopenia/pancytopenia - Plt slightly down, along with other cell lines follow-up peripheral smear. - No signs of active bleeding.  Chronic debility with quadriplegia, severe contractures - PCT consultation requested, son was agreeable to discuss recommendations.  -  Continue home dose of prednisone.  Moderate protein calorie malnutrition - Patient nonverbal, SLP requested and Dys I diet recommended, pt tolerating well but eating < 50% of her meals.  - Has had progressive decline and failure to thrive. - Being followed by dietitian.  Anemia of chronic disease, macrocytic anemia - Vitamin B-12, folate at target range.  - Drop in hemoglobin since admission likely from IV fluids and dilutional component. - Hemoglobin up to 10.5 today.  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 - Evaluated by wound care nurse on 10/23/14. - Being provided with incontinence products and antimicrobial textile.  HTN - Blood pressures are reasonable despite her antihypertensives being held.  Moderate malnutrition  - Evaluated by dietitian 10/29/14. Continue supplements as ordered.  DVT prophylaxis  - SCDs.  Code Status: Full.  Family Communication: No family at the bedside. Palliative care consultation pending.  Disposition Plan: Pending palliative care consultation. Likely discharge 10/30/14, venue not ascertained yet.   IV Access:    Peripheral IV   Procedures and diagnostic studies:   Ct Head Wo Contrast  09/30/2014   CLINICAL DATA:  Acute encephalopathy.  EXAM: CT HEAD WITHOUT CONTRAST  TECHNIQUE: Contiguous axial images were obtained from the base of the skull through the vertex without intravenous contrast.  COMPARISON:  12/03/2013 MRI  FINDINGS: There is no intracranial hemorrhage, mass or evidence of acute infarction. There is no extra-axial fluid collection. There is moderately severe generalized atrophy and moderate periventricular hypodensity which likely represents chronic small vessel disease. No superimposed acute findings are evident. No bony abnormalities are evident. The visible portions of the paranasal sinuses clear.  IMPRESSION: Moderately severe generalized atrophy and  chronic small vessel disease.  No acute findings.   Electronically Signed   By: Ellery Plunkaniel R Mitchell M.D.   On: 09/30/2014 04:45   Dg Chest Port 1 View  10/23/2014   CLINICAL DATA:  Hypoxia  EXAM: PORTABLE CHEST - 1 VIEW  COMPARISON:  Sep 29, 2014  FINDINGS: There is no edema or consolidation. Heart size and pulmonary vascularity are normal. No adenopathy. Bones appear somewhat osteoporotic.  IMPRESSION: No edema or consolidation.   Electronically Signed   By: Bretta BangWilliam  Woodruff [name] M.D.   On: 10/23/2014 08:33   Dg Chest Port 1 View  09/29/2014   CLINICAL DATA:  Altered mental status, some short of breath  EXAM: PORTABLE CHEST - 1 VIEW  COMPARISON:  Radiograph 01/03/2014  FINDINGS: Normal mediastinum and cardiac silhouette. Normal pulmonary vasculature. No evidence of effusion, infiltrate, or pneumothorax. No acute bony abnormality.  IMPRESSION: No acute cardiopulmonary process.   Electronically Signed   By: Genevive BiStewart  Edmunds M.D.   On: 09/29/2014 23:49     Medical Consultants:    Palliative Care  Anti-Infectives:    Rocephin 10/23/14--->  Subjective:   Joan PeelingLora [name] is nonverbal, grunts with stimulation only. Nursing staff reports that she has had 3 loose stools today and also has had some nausea/vomiting.  Objective:    Filed Vitals:   10/28/14 0628 10/28/14 1455 10/28/14 2323 10/29/14 0656  BP: 118/54 131/68 146/84 119/56  Pulse: 70 97 110 57  Temp: 97.8 F (36.6 C) 97.8 F (36.6 C) 97.6 F (36.4 C) 98.7 F (37.1 C)  TempSrc: Oral Oral Oral Oral  Resp: 20 20 16  169  Height:      Weight:      SpO2: 100% 100% 100% 92%    Intake/Output Summary (Last 24 hours) at 10/29/14 0852 Last data filed at 10/29/14 0657  Gross per 24 hour  Intake    770 ml  Output   1175 ml  Net   -405 ml    Exam: Gen:  NAD Cardiovascular:  RRR, No M/R/G Respiratory:  Lungs diminished Gastrointestinal:  Abdomen soft, NT/ND, + BS Extremities:  Upper extremity contractures   Data Reviewed:     Labs: Basic Metabolic Panel:  Recent Labs Lab 10/24/14 0550 10/26/14 0730 10/27/14 0408 10/28/14 0420 10/29/14 0410  NA 144 146* 150* 143 144  K 3.7 5.4* 5.2* 4.7 5.0  CL 95* 107 108 105 103  CO2 39* 32 34* 33* 33*  GLUCOSE 147* 118* 87 118* 78  BUN 8 7 8 8 7   CREATININE 0.63 0.52 0.47 0.57 0.58  CALCIUM 8.2* 8.2* 8.5* 8.0* 8.5*   GFR Estimated Creatinine Clearance: 72.7 mL/min (by C-G formula based on Cr of 0.58). Liver Function Tests:  Recent Labs Lab 10/23/14 0812 10/24/14 0550  AST 50* 34  ALT 23 19  ALKPHOS 68 51  BILITOT 1.2 0.8  PROT 5.4* 4.4*  ALBUMIN 3.0* 2.3*   Coagulation profile  Recent Labs Lab 10/24/14 0400  INR 1.40    CBC:  Recent Labs Lab 10/23/14 0812 10/24/14 0550 10/24/14 0551 10/26/14 0823 10/27/14 0408 10/28/14 0420 10/29/14 0410  WBC 3.8* 2.5*  --  3.1* 2.8* 2.3* 3.2*  NEUTROABS 1.5*  --   --   --   --   --   --   HGB 12.4 10.7*  --  10.3* 9.9* 8.4* 10.2*  HCT 39.6 33.9* 34.5 33.1* 32.1* 26.5* 32.4*  MCV 112.5* 113.0*  --  117.0* 117.6* 114.7* 114.9*  PLT 52* 51*  --  40* 39*  30* 33*   Sepsis Labs:  Recent Labs Lab 10/23/14 0810  10/23/14 1101  10/26/14 0823 10/27/14 0408 10/28/14 0420 10/29/14 0410  WBC  --   < >  --   < > 3.1* 2.8* 2.3* 3.2*  LATICACIDVEN 1.63  --  1.19  --   --   --   --   --   < > = values in this interval not displayed. Microbiology Recent Results (from the past 240 hour(s))  Urine culture     Status: None   Collection Time: 10/23/14  8:39 AM  Result Value Ref Range Status   Specimen Description URINE, CLEAN CATCH  Final   Special Requests NONE  Final   Colony Count   Final    >=100,000 COLONIES/ML Performed at [facility]    Culture   Final    ESCHERICHIA COLI Performed at [facility]    Report Status 10/25/2014 FINAL  Final   Organism ID, Bacteria ESCHERICHIA COLI  Final      Susceptibility   Escherichia coli - MIC*    AMPICILLIN >=32 RESISTANT Resistant      CEFAZOLIN <=4 SENSITIVE Sensitive     CEFTRIAXONE <=1 SENSITIVE Sensitive     CIPROFLOXACIN >=4 RESISTANT Resistant     GENTAMICIN <=1 SENSITIVE Sensitive     LEVOFLOXACIN >=8 RESISTANT Resistant     NITROFURANTOIN <=16 SENSITIVE Sensitive     TOBRAMYCIN <=1 SENSITIVE Sensitive     TRIMETH/SULFA <=20 SENSITIVE Sensitive     PIP/TAZO 8 SENSITIVE Sensitive     * ESCHERICHIA COLI  Clostridium Difficile by PCR     Status: None   Collection Time: 10/26/14 11:02 AM  Result Value Ref Range Status   C difficile by pcr NEGATIVE NEGATIVE Final     Medications:   . antiseptic oral rinse  7 mL Mouth Rinse BID  . cefTRIAXone (ROCEPHIN)  IV  1 g Intravenous Q24H  . predniSONE  60 mg Oral Q breakfast  . valproate sodium  750 mg Intravenous Q8H   Continuous Infusions:   Time spent: 35 minutes.  The patient is medically complex and requires high complexity decision making.    LOS: 6 days   RAMA,CHRISTINA  [facility] Pager [phone]. If unable to reach me by pager, please call my cell phone at [phone].  *Please refer to [email or web address], password [email or web address] to get updated schedule on who will round on this patient, as hospitalists switch teams weekly. If 7PM-7AM, please contact night-coverage at [email or web address], password [email or web address] for any overnight needs.  10/29/2014, 8:52 AM

## 2014-10-29 NOTE — Progress Notes (Signed)
Full note to follow:  Ms. Joan [name] is minimally responsive (opens eyes and shakes hands with tactile stimulation) and not communicative. I spoke with her son Joan [name] who tells me that she has been on hospice care twice but discharged because she had improvement from ~November 2015-February 2016 where she was awake and communicating/interacting with family (although confused at times and not complex conversations), watching tv, and moving limbs slighty with purposeful movement. However he says she began to decline again in February. He is frustrated because they still do not have answers or a clear label for her disease. He feels strongly that he needs more questions answered before he can make any decisions. He acknowledges that she may likely continue to decline but would like to know more expectations. She has [facility] but they have not had follow up yet - scheduled for Friday - stressed importance to attend this appointment. He is very concerned that "her stool is coming out of her vagina" and says this has been happening a long time. He also tells me that he desires SNF placement for now as he has no help to care for her at home. I will follow while hospitalized but they will need palliative follow up either at SNF or home as they follow up with neurology.   Joan ChannelAlicia Tasharra Nodine, NP Palliative Medicine Team Pager # [phone] (M-F 8a-5p) Team Phone # [phone] (Nights/Weekends)

## 2014-10-29 NOTE — Progress Notes (Signed)
Nutrition Follow-up    DOCUMENTATION CODES:  Non-severe (moderate) malnutrition in context of chronic illness  INTERVENTION: - Continue Magic Cup TID - Will order Resource Breeze BID - RD to continue to monitor for needs  NUTRITION DIAGNOSIS:  Increased nutrient needs related to acute illness as evidenced by estimated needs. -ongoing  GOAL:  Patient will meet greater than or equal to 90% of their needs -unmet  MONITOR:  PO intake, Supplement acceptance, Weight trends, Labs, I & O's  ASSESSMENT: Per H&P, h/o catatonia, encephalopathy, progressive neurodegenerative decline of unclear diagnosis, indwelling foley, chronic quadriplegia, contracture, sent from home to ER due to c/o sob for the last three days.  No intakes documented since admission. Per rounds yesterday, pt has not been eating or drinking much. Spoke with tech who states that this is her first day with pt and that pt did take a few bites of applesauce with meds this AM. This RD fed pt 1 bite of oatmeal and 4-5 bites of Magic Cup for breakfast; pt refused further intakes at this time.   Not meeting needs. Documentation codes within this note updated. Pt lost 5% body weight in 3 weeks PTA. No physical signs of wasting present. Labs and medications reviewed.  Height:  Ht Readings from Last 1 Encounters:  10/24/14 5\' 5"  (1.651 m)    Weight:  Wt Readings from Last 1 Encounters:  10/24/14 123 lb 7.3 oz (56 kg)    Ideal Body Weight:  56.8 kg (kg)  Wt Readings from Last 10 Encounters:  10/24/14 123 lb 7.3 oz (56 kg)  10/03/14 129 lb 3 oz (58.6 kg)  12/04/13 164 lb 10.9 oz (74.7 kg)  08/27/13 153 lb 11.2 oz (69.718 kg)  07/30/13 181 lb 14.1 oz (82.5 kg)  07/24/13 170 lb 11.2 oz (77.429 kg)  07/16/13 182 lb 12.2 oz (82.9 kg)  05/17/13 154 lb 12.2 oz (70.2 kg)    BMI:  Body mass index is 20.54 kg/(m^2).  Estimated Nutritional Needs:  Kcal:  1400-1600  Protein:  65-80 grams  Fluid:  2.2-2.5 L/day  Skin:   Wound (see comment) (stage 2 pressure ulcer to sacrum, bilateral breast wounds)  Diet Order:  DIET - DYS 1 Room service appropriate?: Yes; Fluid consistency:: Thin  EDUCATION NEEDS:      Intake/Output Summary (Last 24 hours) at 10/29/14 0939 Last data filed at 10/29/14 0657  Gross per 24 hour  Intake    770 ml  Output   1175 ml  Net   -405 ml    Last BM:  6/1   Trenton GammonJessica Dennisha Mouser, RD, LDN Inpatient Clinical Dietitian Pager # [phone] After hours/weekend pager # [phone]

## 2014-10-29 NOTE — Consult Note (Signed)
Consultation Note Date: 10/29/2014   Patient Name: Joan [name]  DOB: 11/16/1960  MRN: [id number]  Age / Sex: 54 y.o., female   PCP: Angela CoxGayani Y [name], MD Referring Physician: Maryruth Bunhristina P Rama, MD  Reason for Consultation: Establishing goals of care  Palliative Care Assessment and Plan Summary of Established Goals of Care and Medical Treatment Preferences    Palliative Care Discussion Held Today Contacts/Participants in Discussion: Primary Decision Maker: Son Doylene BodeMarcus Powell    Ms. Goldwater is minimally responsive (opens eyes and shakes hands with tactile stimulation) and not communicative. I spoke with her son [name] who tells me that she has been on hospice care twice but discharged because she had improvement from ~November 2015-February 2016 where she was awake and communicating/interacting with family (although confused at times and not complex conversations), watching tv, and moving limbs slighty with purposeful movement. However he says she began to decline again in February. He is frustrated because they still do not have answers or a clear label for her disease. He feels strongly that he needs more questions answered before he can make any decisions. He acknowledges that she very likely will continue to decline but would like to know more expectations. He says that even if there is nothing we can do to help this he would like some answers about what is going on. She [facility] but they have not had follow up yet - scheduled for Friday per [name] - stressed importance to attend this appointment. He is very concerned that "her stool is coming out of her vagina" and says this has been happening a long time. He also tells me that he desires SNF placement for now as he has no help to care for her at home. I will follow while hospitalized but they will need palliative follow up either at SNF or home as they follow up with neurology. Plan:  - Look into SNF  placement - [facility] Berna Spare([name] says appt is Friday 6/3) - Palliative to follow at SNF   Code Status/Advance Care Planning:  FULL - Encouraged [name] to consider code status given her QOL. He says he will think about this.   Symptom Management:   Appears comfortable. No current symptoms to address.  Decreased appetite/malnutrition: Encourage intake and feeding supplements. Not a candidate for PEG d/t h/o gastric bypass?   Psycho-social/Spiritual:   Support System: Poor - son [name] is very supportive and has been caring for her alone but it has become very overwhelming for him - he has no support. He says he has siblings and they will call ~once a week but do not assist in her care.   Prognosis: < 6 months  Discharge Planning:  Skilled Nursing Facility for rehab with Palliative care service follow-up       Chief Complaint/History of Present Illness: 54 y.o. female with catatonia, progressive neurodegenerative decline of unclear diagnosis, followed by [facility], nonverbal at baseline, on chronic steroids and indwelling Foley catheter, chronic quadriplegia with contractures, who was admitted 10/23/14 with worsening dyspnea at rest and with minimal exertion and acute respiratory failure with hypoxia. Symptoms resolved without any specific intervention. Hospital course prolonged as urine culture positive for E. Coli and 7 days therapy completed 6/1. Looking into SNF placement. PMH reviewed.   Primary Diagnoses  Present on Admission:  . Hypoxia . AKI (acute kidney injury) . Anemia . Dehydration . Essential hypertension, benign . Functional quadriplegia . Hypernatremia .  Hypokalemia . Thrombocytopenia . UTI (lower urinary tract infection) . Hereditary and idiopathic peripheral neuropathy  Palliative Review of Systems: Unable to assess - averbal.   I have reviewed the medical record, interviewed the patient and family, and examined the  patient. The following aspects are pertinent.  Past Medical History  Diagnosis Date  . Peripheral neuropathy   . Fibromyalgia   . Hypertension   . Anxiety   . Catatonia   . Seizures    History   Social History  . Marital Status: Single    Spouse Name: N/A  . Number of Children: N/A  . Years of Education: N/A   Social History Main Topics  . Smoking status: Never Smoker   . Smokeless tobacco: Never Used  . Alcohol Use: No  . Drug Use: Not on file  . Sexual Activity: No   Other Topics Concern  . None   Social History Narrative   Family History  Problem Relation Age of Onset  . Hypertension Mother   . Diabetes Father    Scheduled Meds: . antiseptic oral rinse  7 mL Mouth Rinse BID  . cefTRIAXone (ROCEPHIN)  IV  1 g Intravenous Q24H  . feeding supplement (RESOURCE BREEZE)  1 Container Oral BID BM  . predniSONE  60 mg Oral Q breakfast  . valproate sodium  750 mg Intravenous Q8H   Continuous Infusions:  PRN Meds:.fentaNYL (SUBLIMAZE) injection Medications Prior to Admission:  Prior to Admission medications   Medication Sig Start Date End Date Taking? Authorizing Provider  Amino Acids-Protein Hydrolys (FEEDING SUPPLEMENT, PRO-STAT SUGAR FREE 64,) LIQD Take 30 mLs by mouth 2 (two) times daily. 10/07/14  Yes Joseph Art, DO  b complex vitamins tablet Take 1 tablet by mouth daily.   Yes Historical Provider, MD  clonazePAM (KLONOPIN) 0.5 MG tablet Take 0.5 tablets (0.25 mg total) by mouth at bedtime. Patient taking differently: Take 0.5 mg by mouth 2 (two) times daily.  10/07/14  Yes Joseph Art, DO  fluconazole (DIFLUCAN) 150 MG tablet Take 150 mg by mouth daily. 10/16/14  Yes Historical Provider, MD  furosemide (LASIX) 20 MG tablet Take 20 mg by mouth daily. 10/13/14  Yes Historical Provider, MD  hydrALAZINE (APRESOLINE) 10 MG tablet Take 10 mg by mouth 3 (three) times daily.  12/04/13  Yes Dorothea Ogle, MD  Multiple Vitamin (MULTIVITAMIN WITH MINERALS) TABS tablet Take  1 tablet by mouth daily. 10/07/14  Yes Joseph Art, DO  predniSONE (DELTASONE) 20 MG tablet Take 60 mg by mouth daily. 09/24/14  Yes Historical Provider, MD  Valproic Acid (DEPAKENE) 250 MG/5ML SYRP syrup Place 750 mg into feeding tube every 8 (eight) hours.  08/27/13  Yes Alison Murray, MD  fluconazole (DIFLUCAN) 100 MG tablet Take 1 tablet (100 mg total) by mouth daily. Patient not taking: Reported on 10/23/2014 10/07/14   Joseph Art, DO  loperamide (IMODIUM) 1 MG/5ML solution Take 5 mLs (1 mg total) by mouth as needed for diarrhea or loose stools. Patient not taking: Reported on 10/23/2014 10/07/14   Joseph Art, DO   Allergies  Allergen Reactions  . Penicillins Other (See Comments), Rash and Shortness Of Breath    REACTION: Makes her body hurt.  . Citalopram Nausea And Vomiting  . Clindamycin Rash and Other (See Comments)    GI upset  . Duloxetine Hcl Other (See Comments)    GI upset   . Escitalopram Nausea And Vomiting  . Hydromorphone Other (See Comments)  Generalized severe pain  . Morphine Other (See Comments)    Generalized severe pain  . Paroxetine Other (See Comments)    GI upset  . Sertraline Other (See Comments)    GI upset  . Sulfamethoxazole-Trimethoprim Nausea And Vomiting  . Tape Itching, Dermatitis and Rash    Paper tape only.  . Clarithromycin Rash  . Doxycycline Rash and Other (See Comments)    GI upset  . Latex Itching and Rash  . Metoclopramide Anxiety  . Morphine And Related Other (See Comments)    REACTION: Causes pain  . Paroxetine Hcl Other (See Comments)    REACTION:  unknown   CBC:    Component Value Date/Time   WBC 3.2* 10/29/2014 0410   HGB 10.2* 10/29/2014 0410   HCT 32.4* 10/29/2014 0410   HCT 34.5 10/24/2014 0551   PLT 33* 10/29/2014 0410   MCV 114.9* 10/29/2014 0410   NEUTROABS 1.5* 10/23/2014 0812   LYMPHSABS 1.7 10/23/2014 0812   MONOABS 0.6 10/23/2014 0812   EOSABS 0.0 10/23/2014 0812   BASOSABS 0.0 10/23/2014 0812    Comprehensive Metabolic Panel:    Component Value Date/Time   NA 144 10/29/2014 0410   NA 136 06/14/2013 1130   K 5.0 10/29/2014 0410   K 3.5 06/14/2013 1130   CL 103 10/29/2014 0410   CL 104 06/14/2013 1130   CO2 33* 10/29/2014 0410   CO2 24 06/14/2013 1130   BUN 7 10/29/2014 0410   BUN 10 06/14/2013 1130   CREATININE 0.58 10/29/2014 0410   CREATININE 0.94 06/14/2013 1130   GLUCOSE 78 10/29/2014 0410   GLUCOSE 74 06/14/2013 1130   CALCIUM 8.5* 10/29/2014 0410   CALCIUM 8.1* 06/14/2013 1130   AST 34 10/24/2014 0550   ALT 19 10/24/2014 0550   ALKPHOS 51 10/24/2014 0550   BILITOT 0.8 10/24/2014 0550   PROT 4.4* 10/24/2014 0550   ALBUMIN 2.3* 10/24/2014 0550    Physical Exam: Vital Signs: BP 119/56 mmHg  Pulse 57  Temp(Src) 98.7 F (37.1 C) (Oral)  Resp 169  Ht  (1.651 m)  Wt 56 kg (123 lb 7.3 oz)  BMI 20.54 kg/m2  SpO2 92% SpO2: SpO2: 92 % O2 Device: O2 Device: Nasal Cannula O2 Flow Rate: O2 Flow Rate (L/min): 2 L/min Intake/output summary:  Intake/Output Summary (Last 24 hours) at 10/29/14 1214 Last data filed at 10/29/14 1003  Gross per 24 hour  Intake    720 ml  Output   1175 ml  Net   -455 ml   LBM:  6/1 Baseline Weight: Weight: 56.019 kg (123 lb 8 oz) Most recent weight: Weight: 56 kg (123 lb 7.3 oz)  Exam Findings:  Gen: NAD, lying in bed HEENT: Muscle wasting, moist mucous membranes CV: RRR Pulm: No labored breathing Abd: Soft, NT, ND Extremities:  contracted upper extremities Neuro: Opens eyes to tactile/noxious stimulation but does not track, averbal           Palliative Performance Scale: 10%               Additional Data Reviewed: Recent Labs     10/28/14  0420  10/29/14  0410  WBC  2.3*  3.2*  HGB  8.4*  10.2*  PLT  30*  33*  NA  143  144  BUN  8  7  CREATININE  0.57  0.58     Time In: 1115 Time Out: 1220 Time Total:  Greater than 50%  of this time was  spent counseling and coordinating care related to the  above assessment and plan.  Signed by: Ulice Bold, NP  Ulice Bold, NP  10/29/2014, 12:14 PM  Please contact Palliative Medicine Team phone at [phone] for questions and concerns.

## 2014-10-30 ENCOUNTER — Inpatient Hospital Stay (HOSPITAL_COMMUNITY): Payer: Medicare Other

## 2014-10-30 DIAGNOSIS — D696 Thrombocytopenia, unspecified: Secondary | ICD-10-CM

## 2014-10-30 LAB — DIRECT ANTIGLOBULIN TEST (NOT AT ARMC)
DAT, COMPLEMENT: NEGATIVE
DAT, IGG: NEGATIVE

## 2014-10-30 NOTE — Plan of Care (Signed)
Problem: Phase II Progression Outcomes Goal: IV changed to normal saline lock [facility] to maintain line due to being difficult stick

## 2014-10-30 NOTE — Progress Notes (Signed)
Progress Note   Malyiah Fellows ZOX:[id number] DOB: July 19, 1960 DOA: Oct 26, 2014 PCP: [name], MD   Brief Narrative:   Joan [name] is an 54 y.o. female with catatonia, progressive neurodegenerative decline of unclear diagnosis, followed by [facility] neurology Center, nonverbal at baseline, on chronic steroids and indwelling Foley catheter, chronic quadriplegia with contractures, who was admitted 2014-10-26 with worsening dyspnea at rest and with minimal exertion and acute respiratory failure with hypoxia. Symptoms resolved without any specific intervention.  Hospital course prolonged as urine culture positive for E. Coli and sensitive to PO ABX that pt is allergic to and Rocephin IV that pt has been started on is appropriate choice based on urine culture and sensitivity report and pt needs total 7 days therapy. Pt started on Rocephin IV 26-Oct-2014.   Assessment/Plan:   Principal Problem: Acute hypoxic respiratory failure - Unclear etiology, chest x-ray with no clear acute cardiopulmonary findings. - CT angio of the chest was initially requested but never done as pt lost IV access. - Once IV access established, pt no longer symptomatic and with stable oxygen saturations, so no further workup pursued. - Oxygen saturations have remained stable.  Active problems:  Diarrhea - C. difficile PCR negative.  Complicated Escherichia coli UTI - Patient has a chronic indwelling Foley catheter - Completed a one-week course of ceftriaxone 10/29/14. - Son reports that she has had stool in her catheter and coming out of her vagina, raising possibility of fistula. - We'll get CT of abdomen/pelvis with rectal contrast to rule out colovesicular or colovaginal fistula.  Hypokalemia --> supplemented and pt with subsequent hyperkalemia - K remains WNL, off routine supplementation.  Hypernatremia - Resolved with IV fluids.  Pancytopenia (thrombocytopenia, macrocytic anemia, leukopenia) - Platelet  count has dropped steadily, was WNL 12/24/13. - Still awaiting smear review.  HIT panel negative.  B12 and folate not low. - Also has a mild anemia and intermittent leukopenia of unclear etiology. - Hematology consultation requested. Spoke with Dr. Clelia Croft who will see the patient in consultation.  Chronic debility with quadriplegia, severe contractures - PCT consultation requested, son was agreeable to discuss recommendations.  - Continue home dose of prednisone. - Son now wants SNF placement.  Moderate protein calorie malnutrition - Patient nonverbal, SLP requested and Dys I diet recommended, pt tolerating well but eating < 50% of her meals.  - Has had progressive decline and failure to thrive. - Being followed by dietitian.  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 - Evaluated by wound care nurse on 26-Oct-2014. - Being provided with incontinence products and antimicrobial textile.  HTN - Blood pressures are reasonable despite her antihypertensives being held.  Moderate malnutrition  - Evaluated by dietitian 10/29/14. Continue supplements as ordered.  DVT prophylaxis  - SCDs.  Code Status: Full.  Family Communication: Berna Spare, son, updated by telephone at 4:45 pm [phone]  Disposition Plan: Hematology consultation requested, needs further inpatient evaluation of pancytopenia, likely will remain in the hospital a few more days.   IV Access:    Peripheral IV   Procedures and diagnostic studies:   Dg Chest Port 1 View  10/26/14   CLINICAL DATA:  Hypoxia  EXAM: PORTABLE CHEST - 1 VIEW  COMPARISON:  Sep 29, 2014  FINDINGS: There is no edema or consolidation. Heart size and pulmonary vascularity are normal. No adenopathy. Bones appear somewhat osteoporotic.  IMPRESSION: No edema or consolidation.   Electronically Signed   By:  Bretta Bang [name] M.D.   On: 10/23/2014 08:33     Medical  Consultants:    Palliative Care  Hematology/oncology  Anti-Infectives:    Rocephin 10/23/14--->  Subjective:   Candida Peeling is nonverbal, grunts with stimulation.  No change in condition. Spoke with son at length who claims that the patient is passing stool through her vagina and possibly in her urine as well. She has recurrent UTIs. Tells me she becomes nonverbal with infection.  Objective:    Filed Vitals:   10/29/14 0656 10/29/14 1318 10/29/14 2151 10/30/14 0648  BP: 119/56 126/85 133/93 108/28  Pulse: 57 110 114 100  Temp: 98.7 F (37.1 C) 98.2 F (36.8 C) 98.2 F (36.8 C) 98 F (36.7 C)  TempSrc: Oral Axillary Axillary Axillary  Resp: 169 Height:      Weight:      SpO2: 92% 100% 100% 100%    Intake/Output Summary (Last 24 hours) at 10/30/14 0754 Last data filed at 10/30/14 0650  Gross per 24 hour  Intake      0 ml  Output    900 ml  Net   -900 ml    Exam: Gen:  NAD, groans with stimulation but nonverbal otherwise Cardiovascular:  RRR, No M/R/G Respiratory:  Lungs diminished Gastrointestinal:  Abdomen soft, NT/ND, + BS Extremities:  Upper extremity contractures   Data Reviewed:    Labs: Basic Metabolic Panel:  Recent Labs Lab 10/24/14 0550 10/26/14 0730 10/27/14 0408 10/28/14 0420 10/29/14 0410  NA 144 146* 150* 143 144  K 3.7 5.4* 5.2* 4.7 5.0  CL 95* 107 108 105 103  CO2 39* 32 34* 33* 33*  GLUCOSE 147* 118* 87 118* 78  BUN CREATININE 0.63 0.52 0.47 0.57 0.58  CALCIUM 8.2* 8.2* 8.5* 8.0* 8.5*   GFR Estimated Creatinine Clearance: 72.7 mL/min (by C-G formula based on Cr of 0.58). Liver Function Tests:  Recent Labs Lab 10/23/14 0812 10/24/14 0550  AST 50* 34  ALT 23 19  ALKPHOS 68 51  BILITOT 1.2 0.8  PROT 5.4* 4.4*  ALBUMIN 3.0* 2.3*   Coagulation profile  Recent Labs Lab 10/24/14 0400  INR 1.40    CBC:  Recent Labs Lab 10/23/14 0812  10/26/14 0823 10/27/14 0408 10/28/14 0420 10/29/14 0410  10/30/14 0706  WBC 3.8*  < > 3.1* 2.8* 2.3* 3.2* 4.9  NEUTROABS 1.5*  --   --   --   --   --  1.8  HGB 12.4  < > 10.3* 9.9* 8.4* 10.2* 11.3*  HCT 39.6  < > 33.1* 32.1* 26.5* 32.4* 35.3*  MCV 112.5*  < > 117.0* 117.6* 114.7* 114.9* 114.6*  PLT 52*  < > 40* 39* 30* 33* 29*  < > = values in this interval not displayed. Sepsis Labs:  Recent Labs Lab 10/23/14 0810  10/23/14 1101  10/27/14 0408 10/28/14 0420 10/29/14 0410 10/30/14 0706  WBC  --   < >  --   < > 2.8* 2.3* 3.2* 4.9  LATICACIDVEN 1.63  --  1.19  --   --   --   --   --   < > = values in this interval not displayed. Microbiology Recent Results (from the past 240 hour(s))  Urine culture     Status: None   Collection Time: 10/23/14  8:39 AM  Result Value Ref Range Status   Specimen Description URINE, CLEAN CATCH  Final   Special  Requests NONE  Final   Colony Count   Final    >=100,000 COLONIES/ML Performed at [facility]    Culture   Final    ESCHERICHIA COLI Performed at [facility]    Report Status 10/25/2014 FINAL  Final   Organism ID, Bacteria ESCHERICHIA COLI  Final      Susceptibility   Escherichia coli - MIC*    AMPICILLIN >=32 RESISTANT Resistant     CEFAZOLIN <=4 SENSITIVE Sensitive     CEFTRIAXONE <=1 SENSITIVE Sensitive     CIPROFLOXACIN >=4 RESISTANT Resistant     GENTAMICIN <=1 SENSITIVE Sensitive     LEVOFLOXACIN >=8 RESISTANT Resistant     NITROFURANTOIN <=16 SENSITIVE Sensitive     TOBRAMYCIN <=1 SENSITIVE Sensitive     TRIMETH/SULFA <=20 SENSITIVE Sensitive     PIP/TAZO 8 SENSITIVE Sensitive     * ESCHERICHIA COLI  Clostridium Difficile by PCR     Status: None   Collection Time: 10/26/14 11:02 AM  Result Value Ref Range Status   C difficile by pcr NEGATIVE NEGATIVE Final  Clostridium Difficile by PCR     Status: None   Collection Time: 10/29/14  1:54 PM  Result Value Ref Range Status   C difficile by pcr NEGATIVE NEGATIVE Final     Medications:   . antiseptic oral  rinse  7 mL Mouth Rinse BID  . cefTRIAXone (ROCEPHIN)  IV  1 g Intravenous Q24H  . feeding supplement (RESOURCE BREEZE)  1 Container Oral BID BM  . predniSONE  60 mg Oral Q breakfast  . valproate sodium  750 mg Intravenous Q8H   Continuous Infusions:   Time spent: 35 minutes.  The patient is medically complex and requires high complexity decision making.    LOS: 7 days   RAMA,CHRISTINA  [facility] Pager [phone]. If unable to reach me by pager, please call my cell phone at [phone].  *Please refer to [email or web address], password [email or web address] to get updated schedule on who will round on this patient, as hospitalists switch teams weekly. If 7PM-7AM, please contact night-coverage at [email or web address], password [email or web address] for any overnight needs.  10/30/2014, 7:54 AM

## 2014-10-30 NOTE — Plan of Care (Signed)
Problem: Phase [name] Progression Outcomes Goal: Voiding independently Outcome: Not Progressing Foley to remain in place

## 2014-10-30 NOTE — Care Management Note (Signed)
Case Management Note  Patient Details  Name: Joan [name] MRN: [id number] Date of Birth: 09/29/1960  Subjective/Objective:  Platelets-29, hematology cons.palliative care team following.iv abx,iv depacon.  Active w/[facility], butl likely need snf.SW following.                Action/Plan:Monitor progress for d/c plans.   Expected Discharge Date:                  Expected Discharge Plan:  Skilled Nursing Facility  In-House Referral:  Clinical Social Work  Discharge planning Services  CM Consult  Post Acute Care Choice:  Home Health (Active w/[facility]-HHRN/HHPT/HHOT/HH NUrse's adide/HH Child psychotherapistsocial worker) Choice offered to:     DME Arranged:    DME Agency:     HH Arranged:    HH Agency:     Status of Service:  In process, will continue to follow  Medicare Important Message Given:  Yes Date Medicare IM Given:  10/28/14 Medicare IM give by:  Lanier ClamMahabir, Jaydn Moscato Date Additional Medicare IM Given:    Additional Medicare Important Message give by:     If discussed at Long Length of Stay Meetings, dates discussed: 10/30/14   Additional Comments:  Lanier ClamMahabir, Marcia Hartwell, RN 10/30/2014, 12:12 PM

## 2014-10-30 NOTE — Consult Note (Signed)
[place]  Telephone:([phone]) [place] NOTE  Joan [name]                                MR#: [id number]  DOB: Oct 14, 1960                        CSN#: [id number]  Patient Care Team: Merlene Laughter, MD as PCP - General (Internal Medicine) Referring MD: [facility]    Reason for Consult: Thrombocytopenia  Joan [name] 54 y.o. female with multiple medical issues listed below admitted on 5/26 with acute respiratory failure with hypoxia self resolved, complicated with E.coli UTI requiring IV Rocephin and supportive measures. She also had diarrhea with negative C difficile per PCR. She is MRSA positive. Recent HIV is negative. Vitamin B12 was 1312. He CBC on admission was remarkable for leukopenia (now resolved) and mild macrocytic anemia. Her platelets were at 52,000, steadily decreasing to 29,000 today. No transfusions were given to date.   No apparent recent bruising or acute bleeding, such as spontaneous epistaxis, hematuria, melena or hematochezia No exposure to heparin during this admission, Lovenox.  A recent SRA test was negative.  Patient is on Valproate and chronic steroids for neurological disorder. No apparent prior blood or platelet transfusions. Patient never had a hematological evaluation prior to this admission. She never had a bone marrow biopsy.  [name]    PMH:  Past Medical History  Diagnosis Date  . Peripheral neuropathy   . Fibromyalgia   . Hypertension   . Anxiety   . Catatonia   . Seizures     Surgeries:  Past Surgical History  Procedure Laterality Date  . Gastric bypass    . Abdominal hysterectomy    . Jejunostomy N/A 06/25/2013    Procedure:  OPEN JEJUNOSTOMY FEEDING TUBE ;  Surgeon: Gwenyth Ober, MD;  Location: [facility];  Service: General;  Laterality: N/A;  . Laparotomy N/A 07/02/2013    Procedure: EXPLORATORY LAPAROTOMY with revision of jejunostomy feeding tube.;  Surgeon: Gwenyth Ober, MD;  Location: [facility];  Service: General;  Laterality: N/A;  . Bowel resection N/A 07/02/2013    Procedure: SMALL BOWEL RESECTION;  Surgeon: Gwenyth Ober, MD;  Location: [facility];  Service: General;  Laterality: N/A;    Allergies:  Allergies  Allergen Reactions  . Penicillins Other (See Comments), Rash and Shortness Of Breath    REACTION: Makes her body hurt.  . Citalopram Nausea And Vomiting  . Clindamycin Rash and Other (See Comments)    GI upset  . Duloxetine Hcl Other (See Comments)    GI upset   . Escitalopram Nausea And Vomiting  . Hydromorphone Other (See Comments)    Generalized severe pain  . Morphine Other (See Comments)    Generalized severe pain  . Paroxetine Other (See Comments)    GI upset  . Sertraline Other (See Comments)    GI upset  . Sulfamethoxazole-Trimethoprim Nausea And Vomiting  . Tape Itching, Dermatitis and Rash    Paper tape only.  . Clarithromycin Rash  . Doxycycline Rash and Other (See Comments)    GI upset  . Latex Itching and Rash  . Metoclopramide Anxiety  . Morphine And Related Other (See Comments)    REACTION: Causes pain  . Paroxetine Hcl Other (See Comments)    REACTION:  unknown  Medications:    prior to admission:  Prescriptions prior to admission  Medication Sig Dispense Refill Last Dose  . Amino Acids-Protein Hydrolys (FEEDING SUPPLEMENT, PRO-STAT SUGAR FREE 64,) LIQD Take 30 mLs by mouth 2 (two) times daily. 900 mL 0 10/22/2014 at 2200  . b complex vitamins tablet Take 1 tablet by mouth daily.   10/23/2014 at 0630  . clonazePAM (KLONOPIN) 0.5 MG tablet Take 0.5 tablets (0.25 mg total) by mouth at bedtime. (Patient taking differently: Take 0.5 mg by mouth 2 (two) times daily. ) 30 tablet 0 10/23/2014 at 0630  . fluconazole (DIFLUCAN) 150 MG tablet Take 150 mg by mouth daily.   10/22/2014 at 1200  . furosemide (LASIX) 20 MG tablet Take 20 mg by mouth daily.   10/22/2014 at 1200  . hydrALAZINE (APRESOLINE) 10 MG tablet Take 10 mg by mouth 3 (three) times  daily.    Past Month at Unknown time  . Multiple Vitamin (MULTIVITAMIN WITH MINERALS) TABS tablet Take 1 tablet by mouth daily. 30 tablet 0 10/23/2014 at 0630  . predniSONE (DELTASONE) 20 MG tablet Take 60 mg by mouth daily.  0 10/23/2014 at 0600  . Valproic Acid (DEPAKENE) 250 MG/5ML SYRP syrup Place 750 mg into feeding tube every 8 (eight) hours.    Past Month at Unknown time  . fluconazole (DIFLUCAN) 100 MG tablet Take 1 tablet (100 mg total) by mouth daily. (Patient not taking: Reported on 10/23/2014) 10 tablet 0 Not Taking at Unknown time  . loperamide (IMODIUM) 1 MG/5ML solution Take 5 mLs (1 mg total) by mouth as needed for diarrhea or loose stools. (Patient not taking: Reported on 10/23/2014) 120 mL 0 Not Taking at Unknown time    . antiseptic oral rinse  7 mL Mouth Rinse BID  . cefTRIAXone (ROCEPHIN)  IV  1 g Intravenous Q24H  . feeding supplement (RESOURCE BREEZE)  1 Container Oral BID BM  . predniSONE  60 mg Oral Q breakfast  . valproate sodium  750 mg Intravenous Q8H    ZWC:HENIDPOE (SUBLIMAZE) injection  ROS: Unable to obtain, patient is minimally responsive, non verbal.   Family History:    Family History  Problem Relation Age of Onset  . Hypertension Mother   . Diabetes Father     No family history of hematological  disorders.  Social History:  reports that she has never smoked. She has never used smokeless tobacco. She reports that she does not drink alcohol. Her drug history is not on file.  Physical Exam    ECOG PERFORMANCE STATUS:4  Filed Vitals:   10/30/14 0648  BP: 108/28  Pulse: 100  Temp: 98 F (36.7 C)  Resp: 18   Filed Weights   10/23/14 1622 10/24/14 1201  Weight: 123 lb 8 oz (56.019 kg) 123 lb 7.3 oz (56 kg)    GENERAL:minimally responsive, non verbal SKIN: skin color, texture, turgor are normal, no rashes or significant lesions EYES: closed LUNGS: decreased breath sounds at the bases HEART: regular rate & rhythm and no murmurs and no lower  extremity edema ABDOMEN: soft, non-tender and normal bowel sounds Musculoskeletal:no cyanosis of digits and no clubbing  NEURO: upper extremity contractures, involuntary tremors, non verbal, minimally responsive. She has known catatonia   Labs:    Recent Labs Lab 10/26/14 [phone] 10/27/14 0408 10/28/14 0420 10/29/14 0410 10/30/14 0706  WBC 3.1* 2.8* 2.3* 3.2* 4.9  HGB 10.3* 9.9* 8.4* 10.2* 11.3*  HCT 33.1* 32.1* 26.5* 32.4* 35.3*  PLT 40* 39* 30*  33* 29*  MCV 117.0* 117.6* 114.7* 114.9* 114.6*  MCH 36.4* 36.3* 36.4* 36.2* 36.7*  MCHC 31.1 30.8 31.7 31.5 32.0  RDW 20.9* 20.6* 19.4* 18.6* 18.5*  LYMPHSABS  --   --   --   --  2.7  MONOABS  --   --   --   --  0.4  EOSABS  --   --   --   --  0.0  BASOSABS  --   --   --   --  0.0        Recent Labs Lab 10/24/14 0550 10/26/14 0730 10/27/14 0408 10/28/14 0420 10/29/14 0410  NA 144 146* 150* 143 144  K 3.7 5.4* 5.2* 4.7 5.0  CL 95* 107 108 105 103  CO2 39* 32 34* 33* 33*  GLUCOSE 147* 118* 87 118* 78  BUN 8 7 8 8 7   CREATININE 0.63 0.52 0.47 0.57 0.58  CALCIUM 8.2* 8.2* 8.5* 8.0* 8.5*  AST 34  --   --   --   --   ALT 19  --   --   --   --   ALKPHOS 51  --   --   --   --   BILITOT 0.8  --   --   --   --         Component Value Date/Time   BILITOT 0.8 10/24/2014 0550   BILIDIR <0.2 07/24/2013 2304   IBILI NOT CALCULATED 07/24/2013 2304      Recent Labs Lab 10/24/14 0400  INR 1.40    No results for input(s): DDIMER in the last 72 hours.   Anemia panel:  No results for input(s): VITAMINB12, FOLATE, FERRITIN, TIBC, IRON, RETICCTPCT in the last 72 hours.  Urinalysis    Component Value Date/Time   COLORURINE AMBER* 10/23/2014 0839   APPEARANCEUR HAZY* 10/23/2014 0839   LABSPEC 1.020 10/23/2014 0839   PHURINE 6.0 10/23/2014 0839   GLUCOSEU NEGATIVE 10/23/2014 0839   HGBUR MODERATE* 10/23/2014 0839   BILIRUBINUR NEGATIVE 10/23/2014 0839   KETONESUR NEGATIVE 10/23/2014 0839   PROTEINUR 30* 10/23/2014  0839   UROBILINOGEN 0.2 10/23/2014 0839   NITRITE POSITIVE* 10/23/2014 0839   LEUKOCYTESUR SMALL* 10/23/2014 0839    Drugs of Abuse     Component Value Date/Time   LABOPIA NONE DETECTED 06/13/2013 2111   COCAINSCRNUR NONE DETECTED 06/13/2013 2111   LABBENZ NONE DETECTED 06/13/2013 2111   AMPHETMU NONE DETECTED 06/13/2013 2111   THCU NONE DETECTED 06/13/2013 2111   LABBARB NONE DETECTED 06/13/2013 2111     Imaging Studies:  Dg Chest Port 1 View  10/23/2014   CLINICAL DATA:  Hypoxia  EXAM: PORTABLE CHEST - 1 VIEW  COMPARISON:  Sep 29, 2014  FINDINGS: There is no edema or consolidation. Heart size and pulmonary vascularity are normal. No adenopathy. Bones appear somewhat osteoporotic.  IMPRESSION: No edema or consolidation.   Electronically Signed   By: Lowella Grip [name] M.D.   On: 10/23/2014 08:33     A/P: 54 y.o. female with :   Thrombocytopenia: At this point the cause is unknown, but this is in the setting of acute on chronic illness, including E Coli, IV antibiotics, dilution, and Valproate. No Bleeding issues are noted. Consider transfusion of platelets it they are less than 10,000 or acutely bleeding. Continue Prednisone DVT prophylaxis On SCDs  Full Code   Other medical issues as per admitting team  [place], PA-C 10/30/2014 9:29 AM   Patient seen  and examined please see the detailed noted above. Unfortunate 54 year old woman with a rather complex history presents with a fluctuating thrombocytopenia and macrocytosis. Reviewing her blood counts in the last 2 years she had similar episodes of thrombocytopenia dating back to 2014 with low platelet counts close to 40,000 at that time. She appears to have a similar episode with platelet count more severe now and to the 20,000 range without bleeding. She does have significant macrocytosis as well.  On physical examination, she is unresponsive and unable to give much history. Her examination did not reveal any  ecchymosis or petechiae. Could not appreciate any lymphadenopathy or splenomegaly.  Laboratory data were personally reviewed for the last 2 years.  Her peripheral smear was also reviewed and showed no evidence of schistocytes. Platelets are decreased in number. White cells did not appear dysplastic or immature. Polychromasia was noted with very few spherocytes.  Impression and plan: 54 year old woman with complex history with worsening thrombocytopenia and macrocytosis. The differential diagnosis is very broad and not very specific.  I doubt this is a hematological disorder and more likely we are dealing with a reactive finding due to her acute illness at this time. Plasma cell disorder, autoimmune hemolysis/thrombocytopenia, mild dysplasia would also be a consideration.  My recommendation will be as follows:  1. I will obtain a serum protein electrophoresis, repeat a B12 level and obtain Coombs testing to rule out hemolysis. 2. She is already on prednisone and I have no objections to that at this time. Certainly he can help if we are dealing with an autoimmune phenomenon 3. I see no need for any transfusion at this time unless her platelet count is below 10,000 or she has active bleeding. 4. I will continue supportive care but you're doing. 5. I doubt that I will recommend a bone marrow biopsy given the overall clinical picture. I think it would be low yield as well as I'm not sure she is a candidate for any treatment in any case.   Once these results are back we can have further recommendations at that time.  If you any questions over the weekend, Dr. Alvy Bimler is on call and we will be aware of this patient.

## 2014-10-30 NOTE — Progress Notes (Signed)
SLP Cancellation Note  Patient Details Name: Joan [name] MRN: [id number] DOB: 09/11/1960   Cancelled treatment:       Reason Eval/Treat Not Completed:  (per review of MD notes, pt has been unresponsive-no po intake documented since admit)   Joan Burnetamara Marleigh Kaylor, MS [place]  [facility] [phone]

## 2014-10-30 NOTE — Clinical Social Work Placement (Signed)
   CLINICAL SOCIAL WORK PLACEMENT  NOTE  Date:  10/30/2014  Patient Details  Name: Joan [name] MRN: [id number] Date of Birth: 12/20/1960  Clinical Social Work is seeking post-discharge placement for this patient at the Skilled  Nursing Facility level of care (*CSW will initial, date and re-position this form in  chart as items are completed):  Yes   Patient/family provided with [facility] Clinical Social Work Department's list of facilities offering this level of care within the geographic area requested by the patient (or if unable, by the patient's family).  Yes   Patient/family informed of their freedom to choose among providers that offer the needed level of care, that participate in Medicare, Medicaid or managed care program needed by the patient, have an available bed and are willing to accept the patient.  Yes   Patient/family informed of [facility]'s ownership interest in [facility] and [facility], as well as of the fact that they are under no obligation to receive care at these facilities.  PASRR submitted to EDS on       PASRR number received on       Existing PASRR number confirmed on 10/29/14     FL2 transmitted to all facilities in geographic area requested by pt/family on 10/29/14     FL2 transmitted to all facilities within larger geographic area on       Patient informed that his/her managed care company has contracts with or will negotiate with certain facilities, including the following:        Yes   Patient/family informed of bed offers received.  [facility]     Physician recommends and patient chooses bed at      [facility]  .  Patient to be transferred to facility by       Patient family notified on   of transfer.  Name of family member notified:        PHYSICIAN       Additional Comment:    _______________________________________________ Arlyss RepressHarrison, Tameshia Bonneville F,  LCSW 10/30/2014, 4:00 PM

## 2014-10-30 NOTE — Progress Notes (Signed)
CRITICAL VALUE ALERT  Critical value received:  Platelets 29  Date of notification:  10/30/2014   Time of notification:  0727  Critical value read back:Yes.    Nurse who received alert:  Dolores HooseHilary Tamico Mundo RN  MD notified (1st page):  Dr. Darnelle Catalanama  Time of first page:  0727  MD notified (2nd page):  Time of second page:  Responding MD:    Time MD responded:

## 2014-10-31 DIAGNOSIS — E870 Hyperosmolality and hypernatremia: Secondary | ICD-10-CM

## 2014-10-31 DIAGNOSIS — E876 Hypokalemia: Secondary | ICD-10-CM

## 2014-10-31 LAB — CBC WITH DIFFERENTIAL/PLATELET
BASOS ABS: 0 10*3/uL (ref 0.0–0.1)
Basophils Relative: 0 % (ref 0–1)
Eosinophils Absolute: 0 10*3/uL (ref 0.0–0.7)
Eosinophils Relative: 0 % (ref 0–5)
HCT: 35.3 % — ABNORMAL LOW (ref 36.0–46.0)
HEMOGLOBIN: 11.3 g/dL — AB (ref 12.0–15.0)
Lymphocytes Relative: 55 % — ABNORMAL HIGH (ref 12–46)
Lymphs Abs: 2.7 10*3/uL (ref 0.7–4.0)
MCH: 36.7 pg — AB (ref 26.0–34.0)
MCHC: 32 g/dL (ref 30.0–36.0)
MCV: 114.6 fL — ABNORMAL HIGH (ref 78.0–100.0)
MONO ABS: 0.4 10*3/uL (ref 0.1–1.0)
Monocytes Relative: 9 % (ref 3–12)
Neutro Abs: 1.8 10*3/uL (ref 1.7–7.7)
Neutrophils Relative %: 36 % — ABNORMAL LOW (ref 43–77)
PLATELETS: 29 10*3/uL — AB (ref 150–400)
RBC: 3.08 MIL/uL — AB (ref 3.87–5.11)
RDW: 18.5 % — ABNORMAL HIGH (ref 11.5–15.5)
WBC: 4.9 10*3/uL (ref 4.0–10.5)

## 2014-10-31 MED ORDER — CLONAZEPAM 0.5 MG PO TABS: 0.2500 mg | ORAL_TABLET | Freq: Two times a day (BID) | ORAL | Status: DC

## 2014-10-31 MED ORDER — BOOST / RESOURCE BREEZE PO LIQD: 1.0000 | Freq: Two times a day (BID) | ORAL | Status: DC

## 2014-10-31 MED ORDER — NITROFURANTOIN MACROCRYSTAL 50 MG PO CAPS: 50.0000 mg | ORAL_CAPSULE | Freq: Every day | ORAL | Status: DC

## 2014-10-31 NOTE — Clinical Social Work Placement (Signed)
   CLINICAL SOCIAL WORK PLACEMENT  NOTE  Date:  10/31/2014  Patient Details  Name: Joan [name] MRN: [id number] Date of Birth: 09/19/1960  Clinical Social Work is seeking post-discharge placement for this patient at the Skilled  Nursing Facility level of care (*CSW will initial, date and re-position this form in  chart as items are completed):  Yes   Patient/family provided with [facility] Clinical Social Work Department's list of facilities offering this level of care within the geographic area requested by the patient (or if unable, by the patient's family).  Yes   Patient/family informed of their freedom to choose among providers that offer the needed level of care, that participate in Medicare, Medicaid or managed care program needed by the patient, have an available bed and are willing to accept the patient.  Yes   Patient/family informed of [facility]'s ownership interest in [facility] and [facility], as well as of the fact that they are under no obligation to receive care at these facilities.  PASRR submitted to EDS on       PASRR number received on       Existing PASRR number confirmed on 10/29/14     FL2 transmitted to all facilities in geographic area requested by pt/family on 10/29/14     FL2 transmitted to all facilities within larger geographic area on       Patient informed that his/her managed care company has contracts with or will negotiate with certain facilities, including the following:        Yes   Patient/family informed of bed offers received.  [facility]     Physician recommends and patient chooses bed at      [facility]  October [facility] 2014  Patient to be transferred to facility by  ambulance     Patient family notified on  10/31/14 of transfer.  Name of family member notified: [name]/sister       PHYSICIAN       Additional Comment:     _______________________________________________ Annetta MawKujawa,Shaylee Stanislawski G, LCSW 10/31/2014, 3:39 PM

## 2014-10-31 NOTE — Care Management Note (Signed)
Case Management Note  Patient Details  Name: Joan [name] MRN: [id number] Date of Birth: 01/15/1961  Subjective/Objective:                    Action/Plan:d/c snf w/pcs.   Expected Discharge Date:                  Expected Discharge Plan:  Skilled Nursing Facility  In-House Referral:  Clinical Social Work  Discharge planning Services  CM Consult  Post Acute Care Choice:  Home Health (Active w/[facility]-HHRN/HHPT/HHOT/HH [facility] ad[facility]/HH Child psychotherapistsocial worker) Choice offered to:     DME Arranged:    DME Agency:     HH Arranged:    HH Agency:     Status of Service:  Completed, signed off  Medicare Important Message Given:  Yes Date Medicare IM Given:  10/28/14 Medicare IM give by:  Lanier ClamMahabir, Cassiopeia Florentino Date Additional Medicare IM Given:  10/31/14 Additional Medicare Important Message give by:  Lanier ClamMahabir, Veena Sturgess  If discussed at Long Length of Stay Meetings, dates discussed:    Additional Comments:  Lanier ClamMahabir, Savahna Casados, RN 10/31/2014, 12:38 PM

## 2014-10-31 NOTE — Progress Notes (Signed)
Called report to [name]. Awaiting transport for discharge.  Lenox PondsJerry L Schae Cando, RN

## 2014-10-31 NOTE — Progress Notes (Signed)
Events noted. The results of coombs testing and CT reviewed.  The etiology of her thrombocytopenia and macrocytosis is unclear.  This is likely related to acute illness (she had similar episode in the past and have recovered). I do not see a specific medication that is contributing (? Valproic acid). SPEP is pending still. I recommend continuing supportive as you are doing and follow counts closely. I do not think she has an acute hematological emergency.

## 2014-10-31 NOTE — Discharge Summary (Signed)
Physician Discharge Summary  Joan [name] ZOX:[id number] DOB: 01/03/1961 DOA: 10/23/2014  PCP: [name], MD  Admit date: 10/23/2014 Discharge date: 10/31/2014   Recommendations for Outpatient Follow-Up:   1. The patient should continue to be followed by the palliative care team while at [facility]. 2. Was placed on suppressive antibiotic therapy given multiple recurrent UTI. 3. Monitor CBC weekly until thrombocytopenia improves.  If no improvement, consider outpatient heme follow up.    Discharge Diagnosis:   Principal Problem:    Recurrent, complicated, E. Coli UTI (lower urinary tract infection) Active Problems:    Dehydration    Hypernatremia    AKI (acute kidney injury)    Hypokalemia    Essential hypertension, benign    Hereditary and idiopathic peripheral neuropathy    Anemia    Functional quadriplegia    Thrombocytopenia    Hypoxia    Malnutrition of moderate degree    Other pancytopenia    Palliative care encounter    Decreased appetite   Discharge disposition:  SNF: [facility].  Discharge Condition: Guarded.  Diet recommendation: Low sodium, heart healthy.    Wound care: Provide a mattress replacement with low air loss feature, Prevalon boots for pressure redistribution to the heels and guidance for incontinence care using house products. Additionally, use house antimicrobial textile for the inframammary intertriginous dermatitis.   History of Present Illness:   Joan [name] is an 54 y.o. 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 Symptoms resolved without any specific intervention. Hospital course prolonged as urine culture  positive for E. Coli and sensitive to PO ABX that pt is allergic to and Rocephin IV that pt has been started on is appropriate choice based on urine culture and sensitivity report and pt needs total 7 days therapy. Pt started on Rocephin IV May 26th, 2016.    Hospital Course by Problem:   Principal Problem: Complicated Escherichia coli UTI - Patient has a chronic indwelling Foley catheter - Completed a one-week course of ceftriaxone 10/29/14. - Son reports that she has had stool in her catheter and coming out of her vagina, raising possibility of fistula. - CT of abdomen/pelvis with rectal contrast to rule out colovesicular or colovaginal fistula, no evidence of fistula. - Will change out foley prior to D/C. - Macrobid 50 mg Q HS ordered for suppressive therapy.  Active problems:  Acute hypoxic respiratory failure - Unclear etiology, chest x-ray with no clear acute cardiopulmonary findings. - CT angio of the chest was initially requested but never done as pt lost IV access. - Once IV access established, pt no longer symptomatic and with stable oxygen saturations, so no further workup pursued. - Oxygen saturations have remained stable.  Diarrhea - C. difficile PCR negative.  Hypokalemia --> supplemented and pt with subsequent hyperkalemia - K remains WNL, off routine supplementation.  Hypernatremia - Resolved with IV fluids.  Pancytopenia (thrombocytopenia, macrocytic anemia, leukopenia) - Platelet count has dropped steadily, was WNL 12/24/13. - Still awaiting smear review. HIT panel negative. B12 and folate not low. - Also has a mild macrocytic anemia and intermittent leukopenia of unclear etiology. - Hematology consultation performed. Direct anti-globulin test negative.  - Monitor counts as outpatient, thought to be related to acute illness.  Chronic debility with quadriplegia,  severe contractures - PCT consultation requested, son was agreeable to discuss recommendations.   - Continue home dose of prednisone. - Son now wants SNF placement.  Moderate protein calorie malnutrition - Patient nonverbal, SLP requested and Dys I diet recommended, pt tolerating well but eating < 50% of her meals.  - Has had progressive decline and failure to thrive. - Being followed by dietitian.  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 - Evaluated by wound care nurse on 10/23/14. - Being provided with incontinence products and antimicrobial textile.  HTN - Blood pressures are reasonable despite her antihypertensives being held.  Moderate malnutrition  - Evaluated by dietitian 10/29/14. Continue supplements as ordered.    Medical Consultants:    Dr. Eli Hose, Heme-Onc.   Discharge Exam:   Filed Vitals:   10/31/14 0530  BP: 133/75  Pulse: 120  Temp: 98.8 F (37.1 C)  Resp: 20   Filed Vitals:   10/30/14 0648 10/30/14 1430 10/30/14 2236 10/31/14 0530  BP: 108/28 124/75 110/84 133/75  Pulse: 100 131 159 120  Temp: 98 F (36.7 C) 98.2 F (36.8 C) 98.4 F (36.9 C) 98.8 F (37.1 C)  TempSrc: Axillary Axillary Axillary Axillary  Resp: Height:      Weight:      SpO2: 100% 100% 100% 98%    Gen:  NAD Cardiovascular:  RRR, No M/R/G Respiratory: Lungs CTAB Gastrointestinal: Abdomen soft, NT/ND with normal active bowel sounds. Extremities: No C/E/C   The results of significant diagnostics from this hospitalization (including imaging, microbiology, ancillary and laboratory) are listed below for reference.     Procedures and Diagnostic Studies:   Ct Abdomen Pelvis Wo Contrast  10/31/2014   CLINICAL DATA:  Initial encounter for passing stool through vagina. Concern for colovaginal fistula.  EXAM: CT ABDOMEN AND PELVIS WITHOUT CONTRAST  TECHNIQUE: Multidetector CT imaging of the abdomen and pelvis was performed following the standard protocol without IV contrast.   COMPARISON:  07/20/2020.  FINDINGS: Lower chest:  Left pleural effusion with left base atelectasis.  Hepatobiliary: No focal abnormality in the liver on this study without intravenous contrast. No evidence for hepatomegaly. Gallbladder is surgically absent. No intrahepatic or extrahepatic biliary dilation.  Pancreas: Diffuse parenchymal atrophy. Pancreatic head obscured by streak artifact from the colonic contrast.  Spleen: Spleen is unremarkable.  Adrenals/Urinary Tract: No adrenal nodule or mass. No hydronephrosis. No evidence for hydroureter. Urinary bladder is decompressed by Foley catheter.  Stomach/Bowel: Stomach is nondistended. Surgical changes are noted in small bowel without evidence for small bowel obstruction. Colon is diffusely filled with dense contrast material administered prior to the exam. Terminal ileum is refluxed and unremarkable in appearance.  Vascular/Lymphatic: No abdominal aortic aneurysm. No evidence for lymphadenopathy.  Reproductive: Uterus is surgically absent. No convincing evidence for the presence of rectal contrast in the vaginal vault although streak artifact could obscure trace amounts of intra vaginal contrast material. There is some gas noted in the vagina.  Other: No intraperitoneal free fluid.  Musculoskeletal: Bones are diffusely demineralized.  IMPRESSION: 1. No definite CT findings to confirm the presence of colovesical fistula on today's study. 2. Small left pleural effusion with left lower lobe collapse/consolidation.   Electronically Signed   By: Kennith Center M.D.   On: 10/31/2014 07:08   Dg Chest Port 1 View  10/23/2014   CLINICAL DATA:  Hypoxia  EXAM: PORTABLE CHEST - 1 VIEW  COMPARISON:  Sep 29, 2014  FINDINGS: There is no edema or consolidation. Heart size and pulmonary vascularity are normal. No adenopathy. Bones appear somewhat osteoporotic.  IMPRESSION: No edema or consolidation.   Electronically Signed   By: Bretta BangWilliam  Woodruff [name] M.D.   On: 10/23/2014 08:33      Labs:   Basic Metabolic Panel:  Recent Labs Lab 10/26/14 0730 10/27/14 0408 10/28/14 0420 10/29/14 0410  NA 146* 150* 143 144  K 5.4* 5.2* 4.7 5.0  CL 107 108 105 103  CO2 32 34* 33* 33*  GLUCOSE 118* 87 118* 78  BUN 7 8 8 7   CREATININE 0.52 0.47 0.57 0.58  CALCIUM 8.2* 8.5* 8.0* 8.5*   GFR Estimated Creatinine Clearance: 72.7 mL/min (by C-G formula based on Cr of 0.58).  CBC:  Recent Labs Lab 10/26/14 0823 10/27/14 0408 10/28/14 0420 10/29/14 0410 10/30/14 0706  WBC 3.1* 2.8* 2.3* 3.2* 4.9  NEUTROABS  --   --   --   --  1.8  HGB 10.3* 9.9* 8.4* 10.2* 11.3*  HCT 33.1* 32.1* 26.5* 32.4* 35.3*  MCV 117.0* 117.6* 114.7* 114.9* 114.6*  PLT 40* 39* 30* 33* 29*   Microbiology Recent Results (from the past 240 hour(s))  Urine culture     Status: None   Collection Time: 10/23/14  8:39 AM  Result Value Ref Range Status   Specimen Description URINE, CLEAN CATCH  Final   Special Requests NONE  Final   Colony Count   Final    >=100,000 COLONIES/ML Performed at [facility]    Culture   Final    ESCHERICHIA COLI Performed at [facility]    Report Status 10/25/2014 FINAL  Final   Organism ID, Bacteria ESCHERICHIA COLI  Final      Susceptibility   Escherichia coli - MIC*    AMPICILLIN >=32 RESISTANT Resistant     CEFAZOLIN <=4 SENSITIVE Sensitive     CEFTRIAXONE <=1 SENSITIVE Sensitive     CIPROFLOXACIN >=4 RESISTANT Resistant     GENTAMICIN <=1 SENSITIVE Sensitive     LEVOFLOXACIN >=8 RESISTANT Resistant     NITROFURANTOIN <=16 SENSITIVE Sensitive     TOBRAMYCIN <=1 SENSITIVE Sensitive     TRIMETH/SULFA <=20 SENSITIVE Sensitive     PIP/TAZO 8 SENSITIVE Sensitive     * ESCHERICHIA COLI  Clostridium Difficile by PCR     Status: None   Collection Time: 10/26/14 11:02 AM  Result Value Ref Range Status   C difficile by pcr NEGATIVE NEGATIVE Final  Clostridium Difficile by PCR     Status: None   Collection Time: 10/29/14  1:54 PM  Result  Value Ref Range Status   C difficile by pcr NEGATIVE NEGATIVE Final     Discharge Instructions:       Discharge Instructions    Call MD for:  persistant nausea and vomiting    Complete by:  As directed      Call MD for:  severe uncontrolled pain    Complete by:  As directed      Call MD for:  temperature >100.4    Complete by:  As directed      Diet - low sodium heart healthy    Complete by:  As directed      Increase activity slowly    Complete by:  As directed             Medication List    STOP taking these medications        fluconazole 100  MG tablet  Commonly known as:  DIFLUCAN     fluconazole 150 MG tablet  Commonly known as:  DIFLUCAN     furosemide 20 MG tablet  Commonly known as:  LASIX     hydrALAZINE 10 MG tablet  Commonly known as:  APRESOLINE     loperamide 1 MG/5ML solution  Commonly known as:  IMODIUM      TAKE these medications        b complex vitamins tablet  Take 1 tablet by mouth daily.     clonazePAM 0.5 MG tablet  Commonly known as:  KLONOPIN  Take 0.5 tablets (0.25 mg total) by mouth 2 (two) times daily.     feeding supplement (PRO-STAT SUGAR FREE 64) Liqd  Take 30 mLs by mouth 2 (two) times daily.     feeding supplement (RESOURCE BREEZE) Liqd  Take 1 Container by mouth 2 (two) times daily between meals.     multivitamin with minerals Tabs tablet  Take 1 tablet by mouth daily.     nitrofurantoin 50 MG capsule  Commonly known as:  MACRODANTIN  Take 1 capsule (50 mg total) by mouth at bedtime.     predniSONE 20 MG tablet  Commonly known as:  DELTASONE  Take 60 mg by mouth daily.     Valproic Acid 250 MG/5ML Syrp syrup  Commonly known as:  DEPAKENE  Place 750 mg into feeding tube every 8 (eight) hours.          Time coordinating discharge: 40 minutes.  Signed:  RAMA,CHRISTINA  Pager [phone] [facility] 10/31/2014, 11:26 AM

## 2014-11-12 ENCOUNTER — Other Ambulatory Visit: Payer: Self-pay

## 2014-11-12 ENCOUNTER — Other Ambulatory Visit (HOSPITAL_COMMUNITY): Payer: Self-pay

## 2014-11-12 ENCOUNTER — Inpatient Hospital Stay (HOSPITAL_COMMUNITY)
Admission: EM | Admit: 2014-11-12 | Discharge: 2014-11-19 | DRG: 871 | Disposition: A | Discharge status: Other Acute Inpatient Hospital | Payer: Medicare Other | Attending: Internal Medicine | Admitting: Internal Medicine

## 2014-11-12 ENCOUNTER — Encounter (HOSPITAL_COMMUNITY): Payer: Self-pay | Admitting: Emergency Medicine

## 2014-11-12 ENCOUNTER — Emergency Department (HOSPITAL_COMMUNITY): Payer: Medicare Other

## 2014-11-12 DIAGNOSIS — E2740 Unspecified adrenocortical insufficiency: Secondary | ICD-10-CM | POA: Diagnosis not present

## 2014-11-12 DIAGNOSIS — Z888 Allergy status to other drugs, medicaments and biological substances status: Secondary | ICD-10-CM | POA: Diagnosis not present

## 2014-11-12 DIAGNOSIS — I999 Unspecified disorder of circulatory system: Secondary | ICD-10-CM

## 2014-11-12 DIAGNOSIS — G9340 Encephalopathy, unspecified: Secondary | ICD-10-CM | POA: Diagnosis not present

## 2014-11-12 DIAGNOSIS — R7989 Other specified abnormal findings of blood chemistry: Secondary | ICD-10-CM | POA: Diagnosis not present

## 2014-11-12 DIAGNOSIS — Z22322 Carrier or suspected carrier of Methicillin resistant Staphylococcus aureus: Secondary | ICD-10-CM | POA: Diagnosis not present

## 2014-11-12 DIAGNOSIS — L89311 Pressure ulcer of right buttock, stage 1: Secondary | ICD-10-CM | POA: Diagnosis present

## 2014-11-12 DIAGNOSIS — J9621 Acute and chronic respiratory failure with hypoxia: Secondary | ICD-10-CM | POA: Diagnosis present

## 2014-11-12 DIAGNOSIS — T68XXXD Hypothermia, subsequent encounter: Secondary | ICD-10-CM | POA: Diagnosis not present

## 2014-11-12 DIAGNOSIS — J189 Pneumonia, unspecified organism: Secondary | ICD-10-CM

## 2014-11-12 DIAGNOSIS — N179 Acute kidney failure, unspecified: Secondary | ICD-10-CM

## 2014-11-12 DIAGNOSIS — E872 Acidosis: Secondary | ICD-10-CM | POA: Diagnosis present

## 2014-11-12 DIAGNOSIS — L89152 Pressure ulcer of sacral region, stage 2: Secondary | ICD-10-CM | POA: Diagnosis present

## 2014-11-12 DIAGNOSIS — N390 Urinary tract infection, site not specified: Secondary | ICD-10-CM

## 2014-11-12 DIAGNOSIS — E876 Hypokalemia: Secondary | ICD-10-CM | POA: Diagnosis present

## 2014-11-12 DIAGNOSIS — Z7952 Long term (current) use of systemic steroids: Secondary | ICD-10-CM

## 2014-11-12 DIAGNOSIS — I248 Other forms of acute ischemic heart disease: Secondary | ICD-10-CM | POA: Diagnosis present

## 2014-11-12 DIAGNOSIS — R680 Hypothermia, not associated with low environmental temperature: Secondary | ICD-10-CM | POA: Diagnosis not present

## 2014-11-12 DIAGNOSIS — F061 Catatonic disorder due to known physiological condition: Secondary | ICD-10-CM | POA: Diagnosis present

## 2014-11-12 DIAGNOSIS — R739 Hyperglycemia, unspecified: Secondary | ICD-10-CM | POA: Diagnosis not present

## 2014-11-12 DIAGNOSIS — L89321 Pressure ulcer of left buttock, stage 1: Secondary | ICD-10-CM | POA: Diagnosis present

## 2014-11-12 DIAGNOSIS — N3000 Acute cystitis without hematuria: Secondary | ICD-10-CM | POA: Diagnosis not present

## 2014-11-12 DIAGNOSIS — Z1621 Resistance to vancomycin: Secondary | ICD-10-CM | POA: Diagnosis not present

## 2014-11-12 DIAGNOSIS — E870 Hyperosmolality and hypernatremia: Secondary | ICD-10-CM

## 2014-11-12 DIAGNOSIS — Z881 Allergy status to other antibiotic agents status: Secondary | ICD-10-CM

## 2014-11-12 DIAGNOSIS — E43 Unspecified severe protein-calorie malnutrition: Secondary | ICD-10-CM | POA: Diagnosis present

## 2014-11-12 DIAGNOSIS — J9601 Acute respiratory failure with hypoxia: Secondary | ICD-10-CM | POA: Diagnosis present

## 2014-11-12 DIAGNOSIS — G0490 Encephalitis and encephalomyelitis, unspecified: Secondary | ICD-10-CM | POA: Diagnosis not present

## 2014-11-12 DIAGNOSIS — I10 Essential (primary) hypertension: Secondary | ICD-10-CM | POA: Diagnosis not present

## 2014-11-12 DIAGNOSIS — Z79899 Other long term (current) drug therapy: Secondary | ICD-10-CM

## 2014-11-12 DIAGNOSIS — R0902 Hypoxemia: Secondary | ICD-10-CM

## 2014-11-12 DIAGNOSIS — R532 Functional quadriplegia: Secondary | ICD-10-CM | POA: Diagnosis present

## 2014-11-12 DIAGNOSIS — R4182 Altered mental status, unspecified: Secondary | ICD-10-CM | POA: Diagnosis not present

## 2014-11-12 DIAGNOSIS — B952 Enterococcus as the cause of diseases classified elsewhere: Secondary | ICD-10-CM | POA: Diagnosis not present

## 2014-11-12 DIAGNOSIS — A408 Other streptococcal sepsis: Secondary | ICD-10-CM | POA: Diagnosis not present

## 2014-11-12 DIAGNOSIS — M797 Fibromyalgia: Secondary | ICD-10-CM | POA: Diagnosis present

## 2014-11-12 DIAGNOSIS — D638 Anemia in other chronic diseases classified elsewhere: Secondary | ICD-10-CM | POA: Diagnosis present

## 2014-11-12 DIAGNOSIS — B954 Other streptococcus as the cause of diseases classified elsewhere: Secondary | ICD-10-CM | POA: Diagnosis present

## 2014-11-12 DIAGNOSIS — F419 Anxiety disorder, unspecified: Secondary | ICD-10-CM | POA: Diagnosis present

## 2014-11-12 DIAGNOSIS — T68XXXA Hypothermia, initial encounter: Secondary | ICD-10-CM | POA: Diagnosis present

## 2014-11-12 DIAGNOSIS — E860 Dehydration: Secondary | ICD-10-CM | POA: Diagnosis present

## 2014-11-12 DIAGNOSIS — G40909 Epilepsy, unspecified, not intractable, without status epilepticus: Secondary | ICD-10-CM | POA: Diagnosis not present

## 2014-11-12 DIAGNOSIS — R6520 Severe sepsis without septic shock: Secondary | ICD-10-CM

## 2014-11-12 DIAGNOSIS — Y95 Nosocomial condition: Secondary | ICD-10-CM | POA: Diagnosis not present

## 2014-11-12 DIAGNOSIS — Z9884 Bariatric surgery status: Secondary | ICD-10-CM | POA: Diagnosis not present

## 2014-11-12 DIAGNOSIS — D696 Thrombocytopenia, unspecified: Secondary | ICD-10-CM | POA: Diagnosis present

## 2014-11-12 DIAGNOSIS — Z91040 Latex allergy status: Secondary | ICD-10-CM

## 2014-11-12 DIAGNOSIS — G629 Polyneuropathy, unspecified: Secondary | ICD-10-CM | POA: Diagnosis present

## 2014-11-12 DIAGNOSIS — L89159 Pressure ulcer of sacral region, unspecified stage: Secondary | ICD-10-CM | POA: Diagnosis present

## 2014-11-12 DIAGNOSIS — Z4659 Encounter for fitting and adjustment of other gastrointestinal appliance and device: Secondary | ICD-10-CM

## 2014-11-12 DIAGNOSIS — A419 Sepsis, unspecified organism: Principal | ICD-10-CM

## 2014-11-12 DIAGNOSIS — R569 Unspecified convulsions: Secondary | ICD-10-CM | POA: Diagnosis not present

## 2014-11-12 DIAGNOSIS — E162 Hypoglycemia, unspecified: Secondary | ICD-10-CM | POA: Diagnosis not present

## 2014-11-12 DIAGNOSIS — G9341 Metabolic encephalopathy: Secondary | ICD-10-CM | POA: Diagnosis present

## 2014-11-12 DIAGNOSIS — T8351XD Infection and inflammatory reaction due to indwelling urinary catheter, subsequent encounter: Secondary | ICD-10-CM | POA: Diagnosis not present

## 2014-11-12 DIAGNOSIS — Z885 Allergy status to narcotic agent status: Secondary | ICD-10-CM | POA: Diagnosis not present

## 2014-11-12 DIAGNOSIS — R778 Other specified abnormalities of plasma proteins: Secondary | ICD-10-CM | POA: Diagnosis present

## 2014-11-12 DIAGNOSIS — IMO0001 Reserved for inherently not codable concepts without codable children: Secondary | ICD-10-CM | POA: Diagnosis present

## 2014-11-12 LAB — COMPREHENSIVE METABOLIC PANEL
ALT: 32 U/L (ref 14–54)
ALT: 19 U/L (ref 14–54)
ANION GAP: 11 (ref 5–15)
AST: 77 U/L — ABNORMAL HIGH (ref 15–41)
AST: 47 U/L — AB (ref 15–41)
Albumin: 2 g/dL — ABNORMAL LOW (ref 3.5–5.0)
Albumin: <1 g/dL — ABNORMAL LOW (ref 3.5–5.0)
Alkaline Phosphatase: 82 U/L (ref 38–126)
Alkaline Phosphatase: 39 U/L (ref 38–126)
BILIRUBIN TOTAL: 0.6 mg/dL (ref 0.3–1.2)
BUN: 35 mg/dL — AB (ref 6–20)
BUN: 24 mg/dL — ABNORMAL HIGH (ref 6–20)
CO2: 34 mmol/L — ABNORMAL HIGH (ref 22–32)
CO2: 19 mmol/L — AB (ref 22–32)
Calcium: 9.2 mg/dL (ref 8.9–10.3)
Calcium: 5.3 mg/dL — CL (ref 8.9–10.3)
Chloride: 128 mmol/L — ABNORMAL HIGH (ref 101–111)
Chloride: >130 mmol/L (ref 101–111)
Creatinine, Ser: 1.42 mg/dL — ABNORMAL HIGH (ref 0.44–1.00)
Creatinine, Ser: 0.94 mg/dL (ref 0.44–1.00)
GFR calc Af Amer: 48 mL/min — ABNORMAL LOW (ref >60)
GFR calc Af Amer: >60 mL/min (ref >60)
GFR calc non Af Amer: 41 mL/min — ABNORMAL LOW (ref >60)
Glucose, Bld: 177 mg/dL — ABNORMAL HIGH (ref 65–99)
Glucose, Bld: 113 mg/dL — ABNORMAL HIGH (ref 65–99)
POTASSIUM: 2.8 mmol/L — AB (ref 3.5–5.1)
POTASSIUM: 5 mmol/L (ref 3.5–5.1)
SODIUM: 173 mmol/L — AB (ref 135–145)
SODIUM: 164 mmol/L — AB (ref 135–145)
TOTAL PROTEIN: 4.6 g/dL — AB (ref 6.5–8.1)
Total Bilirubin: 1.3 mg/dL — ABNORMAL HIGH (ref 0.3–1.2)
Total Protein: <3 g/dL — ABNORMAL LOW (ref 6.5–8.1)

## 2014-11-12 LAB — CBC WITH DIFFERENTIAL/PLATELET
Basophils Absolute: 0 10*3/uL (ref 0.0–0.1)
Basophils Relative: 0 % (ref 0–1)
Eosinophils Absolute: 0 10*3/uL (ref 0.0–0.7)
Eosinophils Relative: 0 % (ref 0–5)
HCT: 40.1 % (ref 36.0–46.0)
HEMOGLOBIN: 12.3 g/dL (ref 12.0–15.0)
LYMPHS PCT: 16 % (ref 12–46)
Lymphs Abs: 1.1 10*3/uL (ref 0.7–4.0)
MCH: 38 pg — ABNORMAL HIGH (ref 26.0–34.0)
MCHC: 30.7 g/dL (ref 30.0–36.0)
MCV: 123.8 fL — ABNORMAL HIGH (ref 78.0–100.0)
MONOS PCT: 7 % (ref 3–12)
Monocytes Absolute: 0.5 10*3/uL (ref 0.1–1.0)
NEUTROS ABS: 5.4 10*3/uL (ref 1.7–7.7)
Neutrophils Relative %: 77 % (ref 43–77)
Platelets: 35 10*3/uL — ABNORMAL LOW (ref 150–400)
RBC: 3.24 MIL/uL — ABNORMAL LOW (ref 3.87–5.11)
RDW: 18.2 % — AB (ref 11.5–15.5)
WBC Morphology: INCREASED
WBC: 7 10*3/uL (ref 4.0–10.5)

## 2014-11-12 LAB — PHOSPHORUS: PHOSPHORUS: 1.8 mg/dL — AB (ref 2.5–4.6)

## 2014-11-12 LAB — BASIC METABOLIC PANEL
BUN: 32 mg/dL — AB (ref 6–20)
CALCIUM: 7.8 mg/dL — AB (ref 8.9–10.3)
CO2: 25 mmol/L (ref 22–32)
Chloride: >130 mmol/L (ref 101–111)
Creatinine, Ser: 1.29 mg/dL — ABNORMAL HIGH (ref 0.44–1.00)
GFR calc Af Amer: 54 mL/min — ABNORMAL LOW (ref >60)
GFR, EST NON AFRICAN AMERICAN: 46 mL/min — AB (ref >60)
GLUCOSE: 87 mg/dL (ref 65–99)
Potassium: 3.6 mmol/L (ref 3.5–5.1)
Sodium: 167 mmol/L (ref 135–145)

## 2014-11-12 LAB — I-STAT CG4 LACTIC ACID, ED
LACTIC ACID, VENOUS: 8.13 mmol/L — AB (ref 0.5–2.0)
LACTIC ACID, VENOUS: 5.78 mmol/L — AB (ref 0.5–2.0)
Lactic Acid, Venous: 9.15 mmol/L (ref 0.5–2.0)

## 2014-11-12 LAB — PROCALCITONIN: PROCALCITONIN: 0.61 ng/mL

## 2014-11-12 LAB — URINALYSIS, ROUTINE W REFLEX MICROSCOPIC
Glucose, UA: NEGATIVE mg/dL
Glucose, UA: NEGATIVE mg/dL
KETONES UR: 15 mg/dL — AB
Ketones, ur: 15 mg/dL — AB
NITRITE: POSITIVE — AB
NITRITE: NEGATIVE
PH: 5 (ref 5.0–8.0)
PH: 5 (ref 5.0–8.0)
Protein, ur: 100 mg/dL — AB
Protein, ur: 100 mg/dL — AB
SPECIFIC GRAVITY, URINE: 1.04 — AB (ref 1.005–1.030)
Specific Gravity, Urine: 1.03 (ref 1.005–1.030)
UROBILINOGEN UA: 0.2 mg/dL (ref 0.0–1.0)
Urobilinogen, UA: 0.2 mg/dL (ref 0.0–1.0)

## 2014-11-12 LAB — CBC
HCT: 23.4 % — ABNORMAL LOW (ref 36.0–46.0)
HEMATOCRIT: 33.3 % — AB (ref 36.0–46.0)
HEMOGLOBIN: 10.2 g/dL — AB (ref 12.0–15.0)
Hemoglobin: 7.1 g/dL — ABNORMAL LOW (ref 12.0–15.0)
MCH: 37.6 pg — ABNORMAL HIGH (ref 26.0–34.0)
MCH: 37.8 pg — AB (ref 26.0–34.0)
MCHC: 30.3 g/dL (ref 30.0–36.0)
MCHC: 30.6 g/dL (ref 30.0–36.0)
MCV: 123.8 fL — ABNORMAL HIGH (ref 78.0–100.0)
MCV: 123.3 fL — AB (ref 78.0–100.0)
PLATELETS: 18 10*3/uL — AB (ref 150–400)
PLATELETS: 28 10*3/uL — AB (ref 150–400)
RBC: 1.89 MIL/uL — ABNORMAL LOW (ref 3.87–5.11)
RBC: 2.7 MIL/uL — ABNORMAL LOW (ref 3.87–5.11)
RDW: 18.4 % — ABNORMAL HIGH (ref 11.5–15.5)
RDW: 18.3 % — ABNORMAL HIGH (ref 11.5–15.5)
WBC: 4.5 10*3/uL (ref 4.0–10.5)
WBC: 9.1 10*3/uL (ref 4.0–10.5)

## 2014-11-12 LAB — URINE MICROSCOPIC-ADD ON

## 2014-11-12 LAB — TROPONIN I: TROPONIN I: 0.51 ng/mL — AB (ref <0.031)

## 2014-11-12 LAB — PROTIME-INR
INR: 3.48 — ABNORMAL HIGH (ref 0.00–1.49)
Prothrombin Time: 34.2 seconds — ABNORMAL HIGH (ref 11.6–15.2)

## 2014-11-12 LAB — MAGNESIUM: MAGNESIUM: 1.5 mg/dL — AB (ref 1.7–2.4)

## 2014-11-12 LAB — GLUCOSE, CAPILLARY
Glucose-Capillary: 113 mg/dL — ABNORMAL HIGH (ref 65–99)
Glucose-Capillary: 78 mg/dL (ref 65–99)

## 2014-11-12 LAB — MRSA PCR SCREENING: MRSA by PCR: POSITIVE — AB

## 2014-11-12 LAB — LIPASE, BLOOD: Lipase: 11 U/L — ABNORMAL LOW (ref 22–51)

## 2014-11-12 LAB — LACTIC ACID, PLASMA
LACTIC ACID, VENOUS: 5.8 mmol/L — AB (ref 0.5–2.0)
LACTIC ACID, VENOUS: 5.8 mmol/L — AB (ref 0.5–2.0)

## 2014-11-12 LAB — CORTISOL: CORTISOL PLASMA: 36.1 ug/dL

## 2014-11-12 LAB — FIBRINOGEN: Fibrinogen: 218 mg/dL (ref 204–475)

## 2014-11-12 LAB — APTT: aPTT: 40 seconds — ABNORMAL HIGH (ref 24–37)

## 2014-11-12 LAB — AMYLASE: Amylase: 47 U/L (ref 28–100)

## 2014-11-12 MED ORDER — LEVALBUTEROL HCL 0.63 MG/3ML IN NEBU
0.6300 mg | INHALATION_SOLUTION | RESPIRATORY_TRACT | Status: DC | PRN
  Filled 2014-11-12: qty 3

## 2014-11-12 MED ORDER — VANCOMYCIN HCL IN DEXTROSE 750-5 MG/150ML-% IV SOLN
750.0000 mg | INTRAVENOUS | Status: DC
  Administered 2014-11-13: 750 mg via INTRAVENOUS
  Filled 2014-11-12: qty 150

## 2014-11-12 MED ORDER — SODIUM CHLORIDE 0.9 % IV SOLN
INTRAVENOUS | Status: DC
  Administered 2014-11-12: 16:00:00 via INTRAVENOUS

## 2014-11-12 MED ORDER — FREE WATER
100.0000 mL | Status: DC
  Administered 2014-11-13 (×3): 100 mL

## 2014-11-12 MED ORDER — POTASSIUM CHLORIDE 10 MEQ/100ML IV SOLN
10.0000 meq | INTRAVENOUS | Status: AC
  Administered 2014-11-12 (×4): 10 meq via INTRAVENOUS
  Filled 2014-11-12 (×4): qty 100

## 2014-11-12 MED ORDER — PIPERACILLIN-TAZOBACTAM 3.375 G IVPB 30 MIN
3.3750 g | Freq: Once | INTRAVENOUS | Status: AC
  Administered 2014-11-12: 3.375 g via INTRAVENOUS
  Filled 2014-11-12: qty 50

## 2014-11-12 MED ORDER — SODIUM CHLORIDE 0.9 % IV BOLUS (SEPSIS)
1000.0000 mL | Freq: Once | INTRAVENOUS | Status: AC
  Administered 2014-11-12: 1000 mL via INTRAVENOUS

## 2014-11-12 MED ORDER — HYDROCORTISONE NA SUCCINATE PF 100 MG IJ SOLR
100.0000 mg | Freq: Two times a day (BID) | INTRAMUSCULAR | Status: DC
  Administered 2014-11-12 – 2014-11-17 (×11): 100 mg via INTRAVENOUS
  Filled 2014-11-12 (×13): qty 2

## 2014-11-12 MED ORDER — ONDANSETRON HCL 4 MG/2ML IJ SOLN: 4.0000 mg | Freq: Four times a day (QID) | INTRAMUSCULAR | Status: DC | PRN

## 2014-11-12 MED ORDER — SODIUM CHLORIDE 0.9 % IV BOLUS (SEPSIS)
1680.0000 mL | Freq: Once | INTRAVENOUS | Status: AC
  Administered 2014-11-12: 1680 mL via INTRAVENOUS

## 2014-11-12 MED ORDER — PANTOPRAZOLE SODIUM 40 MG IV SOLR
40.0000 mg | Freq: Every day | INTRAVENOUS | Status: DC
  Administered 2014-11-12 – 2014-11-13 (×2): 40 mg via INTRAVENOUS
  Filled 2014-11-12 (×3): qty 40

## 2014-11-12 MED ORDER — PIPERACILLIN-TAZOBACTAM 3.375 G IVPB
3.3750 g | Freq: Three times a day (TID) | INTRAVENOUS | Status: DC
  Administered 2014-11-12 – 2014-11-19 (×20): 3.375 g via INTRAVENOUS
  Filled 2014-11-12 (×25): qty 50

## 2014-11-12 MED ORDER — VANCOMYCIN HCL IN DEXTROSE 1-5 GM/200ML-% IV SOLN
1000.0000 mg | Freq: Once | INTRAVENOUS | Status: AC
  Administered 2014-11-12: 1000 mg via INTRAVENOUS
  Filled 2014-11-12: qty 200

## 2014-11-12 MED ORDER — INSULIN ASPART 100 UNIT/ML ~~LOC~~ SOLN
0.0000 [IU] | SUBCUTANEOUS | Status: DC
  Administered 2014-11-13: 3 [IU] via SUBCUTANEOUS
  Administered 2014-11-13 – 2014-11-14 (×3): 2 [IU] via SUBCUTANEOUS
  Administered 2014-11-14 – 2014-11-15 (×3): 3 [IU] via SUBCUTANEOUS
  Administered 2014-11-15 – 2014-11-16 (×2): 0 [IU] via SUBCUTANEOUS
  Administered 2014-11-16 (×2): 3 [IU] via SUBCUTANEOUS
  Administered 2014-11-16: 2 [IU] via SUBCUTANEOUS
  Administered 2014-11-17: 3 [IU] via SUBCUTANEOUS
  Administered 2014-11-17 – 2014-11-19 (×4): 2 [IU] via SUBCUTANEOUS

## 2014-11-12 NOTE — ED Notes (Signed)
Pt transported to IR with this nurse, report given to [employer] Pt will returned to Room 45 when procedure is completed.

## 2014-11-12 NOTE — Sedation Documentation (Signed)
Eyes closed, resting

## 2014-11-12 NOTE — ED Provider Notes (Signed)
CSN: [id number]     Arrival date & time 11/12/14  1119 History   First MD Initiated Contact with Patient 11/12/14 1134     Chief Complaint  Patient presents with  . Altered Mental Status  . Abnormal Lab     (Consider location/radiation/quality/duration/timing/severity/associated sxs/prior Treatment) HPI   Joan [name] is a 54 y.o. female reportedly here for evaluation of altered mental status, worse than her baseline, which is stated to be "catatonic".  Level V Caveat- catatonia   Past Medical History  Diagnosis Date  . Peripheral neuropathy   . Fibromyalgia   . Hypertension   . Anxiety   . Catatonia   . Seizures    Past Surgical History  Procedure Laterality Date  . Gastric bypass    . Abdominal hysterectomy    . Jejunostomy N/A 06/25/2013    Procedure:  OPEN JEJUNOSTOMY FEEDING TUBE ;  Surgeon: Cherylynn Ridges, MD;  Location: [facility] OR;  Service: General;  Laterality: N/A;  . Laparotomy N/A 07/02/2013    Procedure: EXPLORATORY LAPAROTOMY with revision of jejunostomy feeding tube.;  Surgeon: Cherylynn Ridges, MD;  Location: [facility] OR;  Service: General;  Laterality: N/A;  . Bowel resection N/A 07/02/2013    Procedure: SMALL BOWEL RESECTION;  Surgeon: Cherylynn Ridges, MD;  Location: [facility] OR;  Service: General;  Laterality: N/A;   Family History  Problem Relation Age of Onset  . Hypertension Mother   . Diabetes Father    History  Substance Use Topics  . Smoking status: Never Smoker   . Smokeless tobacco: Never Used  . Alcohol Use: No   OB History    No data available     Review of Systems  Unable to perform ROS     Allergies  Citalopram; Clindamycin; Duloxetine hcl; Escitalopram; Hydromorphone; Morphine; Paroxetine; Sertraline; Sulfamethoxazole-trimethoprim; Tape; Clarithromycin; Doxycycline; Latex; Metoclopramide; Morphine and related; and Paroxetine hcl  Home Medications   Prior to Admission medications   Medication Sig Start Date End Date Taking? Authorizing Provider   Amino Acids-Protein Hydrolys (FEEDING SUPPLEMENT, PRO-STAT SUGAR FREE 64,) LIQD Take 30 mLs by mouth 2 (two) times daily. 10/07/14  Yes Joseph Art, DO  b complex vitamins tablet Take 1 tablet by mouth daily.   Yes Historical Provider, MD  clonazePAM (KLONOPIN) 0.5 MG tablet Take 0.5 tablets (0.25 mg total) by mouth 2 (two) times daily. 10/31/14  Yes Maryruth Bun Rama, MD  Multiple Vitamin (MULTIVITAMIN WITH MINERALS) TABS tablet Take 1 tablet by mouth daily. 10/07/14  Yes Joseph Art, DO  nitrofurantoin (MACRODANTIN) 50 MG capsule Take 1 capsule (50 mg total) by mouth at bedtime. 10/31/14  Yes Christina P Rama, MD  predniSONE (DELTASONE) 20 MG tablet Take 60 mg by mouth daily. 09/24/14  Yes Historical Provider, MD  Valproic Acid (DEPAKENE) 250 MG/5ML SYRP syrup Place 750 mg into feeding tube every 8 (eight) hours.  08/27/13  Yes Alison Murray, MD  feeding supplement, RESOURCE BREEZE, (RESOURCE BREEZE) LIQD Take 1 Container by mouth 2 (two) times daily between meals. 10/31/14   Christina P Rama, MD   BP 77/36 mmHg  Pulse 124  Temp(Src) 100.3 F (37.9 C) (Rectal)  Resp 21  Wt 120 lb (54.432 kg)  SpO2 99% Physical Exam  Constitutional: She appears well-developed.  Frail, appears older than stated age.  HENT:  Head: Normocephalic and atraumatic.  Right Ear: External ear normal.  Left Ear: External ear normal.  Mucous membranes are dry.  Eyes: Conjunctivae and EOM are  normal.  Right pupil 3 mm , left pupil 6 mm  Neck: Normal range of motion and phonation normal. Neck supple.  Cardiovascular: Normal rate, regular rhythm and normal heart sounds.   Pulmonary/Chest: Effort normal and breath sounds normal. She exhibits no bony tenderness.  Abdominal: Soft. There is no tenderness.  Musculoskeletal: Normal range of motion.  Neurological: She is alert. No sensory deficit.  Eyes are open. No tracking noted. Occasional guttural verbalizations. Does not follow commands. Bilateral upper extremity  contractures.  Skin: Skin is warm, dry and intact.  Psychiatric:  Obtunded  Nursing note and vitals reviewed.   ED Course  Procedures (including critical care time)  Delay in care encountered for adequate IV access. Access ultimately obtained by PICC line placed by interventional radiology (1500)  Proximal your evidence by decreased oxygen saturation treated with facemask oxygen with improvement to normal. Oxygen titrated with Ventimask.  Medications  vancomycin (VANCOCIN) IVPB 1000 mg/200 mL premix (1,000 mg Intravenous New Bag/Given 11/12/14 1456)  piperacillin-tazobactam (ZOSYN) IVPB 3.375 g (3.375 g Intravenous New Bag/Given 11/12/14 1457)  vancomycin (VANCOCIN) IVPB 750 mg/150 ml premix (not administered)  piperacillin-tazobactam (ZOSYN) IVPB 3.375 g (not administered)  sodium chloride 0.9 % bolus 1,680 mL (1,680 mLs Intravenous Restarted 11/12/14 1456)    Patient Vitals for the past 24 hrs:  BP Temp Temp src Pulse Resp SpO2 Weight  11/12/14 1457 (!) 77/36 mmHg - - (!) 124 21 99 % -  11/12/14 1431 - - - (!) 135 (!) 30 99 % -  11/12/14 1424 - - - (!) 136 (!) 28 98 % -  11/12/14 1412 - - - 118 (!) 32 100 % -  11/12/14 1407 - - - (!) 125 (!) 27 100 % -  11/12/14 1330 156/78 mmHg - - (!) 132 (!) 35 95 % -  11/12/14 1307 (!) 251/202 mmHg - - (!) 135 (!) 31 95 % -  11/12/14 1254 - - - - - - 120 lb (54.432 kg)  11/12/14 1246 - - - (!) 151 19 99 % -  11/12/14 1230 (!) 195/153 mmHg - - - (!) 36 - -  11/12/14 1207 - 100.3 F (37.9 C) Rectal - - - -  11/12/14 1200 (!) 165/104 mmHg - - (!) 122 (!) 35 95 % -  11/12/14 1145 159/86 mmHg - - - (!) 32 - -  11/12/14 1130 (!) 177/102 mmHg - - 67 26 100 % -  11/12/14 1123 161/94 mmHg - - 117 (!) 28 100 % -   14:45- Findings discussed with the patient's son. He is interested in having her seen by neurology to define her ongoing neurologic process, which he states is as yet undefined.  3:05 PM Reevaluation with update and discussion. After  initial assessment and treatment, an updated evaluation reveals no change in clinical status. Joan [name]   15:08- consultation intensivist for admission. He will see and admit the patient  Labs Review Labs Reviewed  COMPREHENSIVE METABOLIC PANEL - Abnormal; Notable for the following:    Sodium 173 (*)    Potassium 2.8 (*)    Chloride 128 (*)    CO2 34 (*)    Glucose, Bld 177 (*)    BUN 35 (*)    Creatinine, Ser 1.42 (*)    Total Protein 4.6 (*)    Albumin 2.0 (*)    AST 77 (*)    Total Bilirubin 1.3 (*)    GFR calc non Af Amer 41 (*)  GFR calc Af Amer 48 (*)    All other components within normal limits  CBC WITH DIFFERENTIAL/PLATELET - Abnormal; Notable for the following:    RBC 3.24 (*)    MCV 123.8 (*)    MCH 38.0 (*)    RDW 18.2 (*)    Platelets 35 (*)    All other components within normal limits  URINALYSIS, ROUTINE W REFLEX MICROSCOPIC (NOT AT [facility]) - Abnormal; Notable for the following:    Color, Urine ORANGE (*)    APPearance TURBID (*)    Hgb urine dipstick LARGE (*)    Bilirubin Urine SMALL (*)    Ketones, ur 15 (*)    Protein, ur 100 (*)    Nitrite POSITIVE (*)    Leukocytes, UA SMALL (*)    All other components within normal limits  URINE MICROSCOPIC-ADD ON - Abnormal; Notable for the following:    Squamous Epithelial / LPF FEW (*)    Bacteria, UA MANY (*)    Crystals CA OXALATE CRYSTALS (*)    All other components within normal limits  I-STAT CG4 LACTIC ACID, ED - Abnormal; Notable for the following:    Lactic Acid, Venous 8.13 (*)    All other components within normal limits  I-STAT CG4 LACTIC ACID, ED - Abnormal; Notable for the following:    Lactic Acid, Venous 9.15 (*)    All other components within normal limits  CULTURE, BLOOD (ROUTINE X 2)  CULTURE, BLOOD (ROUTINE X 2)  URINE CULTURE    BUN  Date Value Ref Range Status  11/12/2014 35* 6 - 20 mg/dL Final  57/84/6962 7 6 - 20 mg/dL Final  95/28/4132 8 6 - 20 mg/dL Final  44/05/270 8  6 - 20 mg/dL Final  53/66/4403 10 4-74 mg/dL Final   CREATININE  Date Value Ref Range Status  06/14/2013 0.94 0.60-1.30 mg/dL Final   CREATININE, SER  Date Value Ref Range Status  11/12/2014 1.42* 0.44 - 1.00 mg/dL Final  25/95/6387 5.64 0.44 - 1.00 mg/dL Final  33/29/5188 4.16 0.44 - 1.00 mg/dL Final  60/63/0160 1.09 0.44 - 1.00 mg/dL Final    CRITICAL CARE Performed by: Mancel Bale [name] Total critical care time: 75 minutes Critical care time was exclusive of separately billable procedures and treating other patients. Critical care was necessary to treat or prevent imminent or life-threatening deterioration. Critical care was time spent personally by me on the following activities: development of treatment plan with patient and/or surrogate as well as nursing, discussions with consultants, evaluation of patient's response to treatment, examination of patient, obtaining history from patient or surrogate, ordering and performing treatments and interventions, ordering and review of laboratory studies, ordering and review of radiographic studies, pulse oximetry and re-evaluation of patient's condition.  Imaging Review Dg Chest Port 1 View  11/12/2014   CLINICAL DATA:  Sepsis, seizures, peripheral neuropathy  EXAM: PORTABLE CHEST - 1 VIEW  COMPARISON:  10/23/2014  FINDINGS: Cardiomediastinal silhouette is stable. Mild elevation of the left hemidiaphragm. There is hazy infiltrate in left lower lobe retrocardiac highly suspicious for pneumonia. Right lung is clear. Mild degenerative changes thoracic spine  IMPRESSION: Hazy infiltrate in left lower lobe retrocardiac highly suspicious for pneumonia. Right lung is clear.   Electronically Signed   By: Natasha Mead M.D.   On: 11/12/2014 12:33     EKG Interpretation None        Date: 11/11/13- MUSE Hyperlink is inactive  Rate: 131  Rhythm: sinus tachycardia and sinus arrhythmia  QRS Axis: normal  PR and QT Intervals: normal  ST/T Wave  abnormalities: normal  PR and QRS Conduction Disutrbances:none  Narrative Interpretation:   Old EKG Reviewed: changes noted    MDM   Final diagnoses:  Sepsis  HCAP (healthcare-associated pneumonia)  UTI (lower urinary tract infection)  Hypokalemia  Hypernatremia  AKI (acute kidney injury)  Hypoxia    Chronically ill patient, from skilled nursing facility with acute mental status changes by report. Clinical indicators for severe sepsis, with lactate, which has worsened during the treatment period. She will require evaluation and treatment by critical care service. Source of infection include both pneumonia and urinary tract infection. There is also incidental hypernatremia, indicating chronic dehydration status. The patient likely needs to have reinitiation of enteral feedings. She may benefit from further assessment of her neurologic status, as well as readdressing her CODE STATUS, which is currently "full".  Nursing Notes Reviewed/ Care Coordinated, and agree without changes. Applicable Imaging Reviewed.  Interpretation of Laboratory Data incorporated into ED treatment  Plan: Admit    Mancel Bale, MD 11/12/14 [id number]

## 2014-11-12 NOTE — Sedation Documentation (Signed)
Rad Tech dressing piccline w/ gauze and tegaderm. No oozing noted. Pt tolerated well, awake now. Transport requested.

## 2014-11-12 NOTE — ED Notes (Signed)
164 sodium critical value. Chloride greater than 130 Troponin 0.51 Calcium 5.2

## 2014-11-12 NOTE — ED Notes (Signed)
Spoke with Dr. Effie Shy about patient meeting sepsis criteria. No new orders

## 2014-11-12 NOTE — ED Notes (Signed)
Per [facility], pt from [facility] care center. Pt normal mental status is catatonic and posturing. Staff called EMS for abnormal lab work. Also for increased AMS. Pt occassionally opens her eyes and moans. Pt has foley catheter in, urine appears very dark and possible blood. Hx of tachycardia. HR 120s. Sinus tach with PACs. L pupil is more dialiated than the R pupil. R pupil sluggish response. When [facility] arrived, CBG was 50's, gave her D10, CBG now 264.

## 2014-11-12 NOTE — Progress Notes (Signed)
CRITICAL LAB Lactic acid 5.8 called to [facility] MD

## 2014-11-12 NOTE — H&P (Signed)
PULMONARY / CRITICAL CARE MEDICINE   Name: Joan [name] MRN: [id number] DOB: Oct 01, 1960    ADMISSION DATE:  11/12/2014  REFERRING MD :  EDP  CHIEF COMPLAINT:  Sepsis   INITIAL PRESENTATION: 54yo female SNF resident with hx catatonia, progressive neurodegenerative decline of unclear diagnosis on chronic steroids, functional quadriplegia,  seizures, HTN, nonverbal/contracted at baseline.  Recent hospitalization for recurrent UTI [facility]) in setting chronic foley.  Presented 6/15 from SNF r/t abnormal lab results including hypernatremia and hypokalemia.  In ER found to be hypotensive with lactate=9, CXR with PNA, U/a suspicious for UTI and PCCM called to admit.    STUDIES:    SIGNIFICANT EVENTS:    HISTORY OF PRESENT ILLNESS: 54yo female SNF resident with hx catatonia, progressive neurodegenerative decline of unclear diagnosis, functional quadriplegia,  seizures, HTN, nonverbal/contracted at baseline.  Presented 6/15 from SNF r/t abnormal lab results including hypernatremia and hypokalemia.  In ER found to be hypotensive with lactate=9, CXR with PNA, U/a suspicious for UTI and PCCM called to admit.   PAST MEDICAL HISTORY :   has a past medical history of Peripheral neuropathy; Fibromyalgia; Hypertension; Anxiety; Catatonia; and Seizures.  has past surgical history that includes Gastric bypass; Abdominal hysterectomy; Jejunostomy (N/A, 06/25/2013); laparotomy (N/A, 07/02/2013); and Bowel resection (N/A, 07/02/2013). Prior to Admission medications   Medication Sig Start Date End Date Taking? Authorizing Provider  Amino Acids-Protein Hydrolys (FEEDING SUPPLEMENT, PRO-STAT SUGAR FREE 64,) LIQD Take 30 mLs by mouth 2 (two) times daily. 10/07/14  Yes Joseph Art, DO  b complex vitamins tablet Take 1 tablet by mouth daily.   Yes Historical Provider, MD  clonazePAM (KLONOPIN) 0.5 MG tablet Take 0.5 tablets (0.25 mg total) by mouth 2 (two) times daily. 10/31/14  Yes Maryruth Bun Rama, MD  Multiple Vitamin  (MULTIVITAMIN WITH MINERALS) TABS tablet Take 1 tablet by mouth daily. 10/07/14  Yes Joseph Art, DO  nitrofurantoin (MACRODANTIN) 50 MG capsule Take 1 capsule (50 mg total) by mouth at bedtime. 10/31/14  Yes Christina P Rama, MD  predniSONE (DELTASONE) 20 MG tablet Take 60 mg by mouth daily. 09/24/14  Yes Historical Provider, MD  Valproic Acid (DEPAKENE) 250 MG/5ML SYRP syrup Place 750 mg into feeding tube every 8 (eight) hours.  08/27/13  Yes Alison Murray, MD  feeding supplement, RESOURCE BREEZE, (RESOURCE BREEZE) LIQD Take 1 Container by mouth 2 (two) times daily between meals. 10/31/14   Maryruth Bun Rama, MD   Allergies  Allergen Reactions  . Citalopram Nausea And Vomiting  . Clindamycin Rash and Other (See Comments)    GI upset  . Duloxetine Hcl Other (See Comments)    GI upset   . Escitalopram Nausea And Vomiting  . Hydromorphone Other (See Comments)    Generalized severe pain  . Morphine Other (See Comments)    Generalized severe pain  . Paroxetine Other (See Comments)    GI upset  . Sertraline Other (See Comments)    GI upset  . Sulfamethoxazole-Trimethoprim Nausea And Vomiting  . Tape Itching, Dermatitis and Rash    Paper tape only.  . Clarithromycin Rash  . Doxycycline Rash and Other (See Comments)    GI upset  . Latex Itching and Rash  . Metoclopramide Anxiety  . Morphine And Related Other (See Comments)    REACTION: Causes pain  . Paroxetine Hcl Other (See Comments)    REACTION:  unknown    FAMILY HISTORY:  has no family status information on file.  SOCIAL HISTORY:  reports  that she has never smoked. She has never used smokeless tobacco. She reports that she does not drink alcohol.  REVIEW OF SYSTEMS:  Unable.  As per HPI obtained from records and bedside RN.   SUBJECTIVE:   VITAL SIGNS: Temp:  [100.3 F (37.9 C)] 100.3 F (37.9 C) (06/15 1207) Pulse Rate:  [26-151] 65 (06/15 1515) Resp:  [17-43] 20 (06/15 1515) BP: (77-251)/(33-202) 101/77 mmHg (06/15  1515) SpO2:  [76 %-100 %] 100 % (06/15 1515) FiO2 (%):  [100 %] 100 % (06/15 1407) Weight:  [120 lb (54.432 kg)] 120 lb (54.432 kg) (06/15 1254) HEMODYNAMICS:   VENTILATOR SETTINGS: Vent Mode:  [-]  FiO2 (%):  [100 %] 100 % INTAKE / OUTPUT: No intake or output data in the 24 hours ending 11/12/14 1535  PHYSICAL EXAMINATION: General:  Chronically ill appearing female, NAD  Neuro:  Opens eyes spontaneously, does not track or follow commands, contracted.  Catatonia at baseline.  HEENT:  Mm dry, no JVD, NRB  Cardiovascular:  Irreg, tachy Lungs:  resps even, non labored on NRB, coarse Abdomen:  Soft, +bs Musculoskeletal:  Warm and dry, no sig edema, bilat foot drop boots  LABS:  CBC  Recent Labs Lab 11/12/14 1205  WBC 7.0  HGB 12.3  HCT 40.1  PLT 35*   Coag's No results for input(s): APTT, INR in the last 168 hours. BMET  Recent Labs Lab 11/12/14 1205  NA 173*  K 2.8*  CL 128*  CO2 34*  BUN 35*  CREATININE 1.42*  GLUCOSE 177*   Electrolytes  Recent Labs Lab 11/12/14 1205  CALCIUM 9.2   Sepsis Markers  Recent Labs Lab 11/12/14 1213 11/12/14 1455  LATICACIDVEN 8.13* 9.15*   ABG No results for input(s): PHART, PCO2ART, PO2ART in the last 168 hours. Liver Enzymes  Recent Labs Lab 11/12/14 1205  AST 77*  ALT 32  ALKPHOS 82  BILITOT 1.3*  ALBUMIN 2.0*   Cardiac Enzymes No results for input(s): TROPONINI, PROBNP in the last 168 hours. Glucose No results for input(s): GLUCAP in the last 168 hours.  Imaging Ir Fluoro Guide Cv Line Right  11/12/2014   CLINICAL DATA:  Altered mental status, metabolic derangement, poor venous access  EXAM: PICC PLACEMENT WITH ULTRASOUND AND FLUOROSCOPY  FLUOROSCOPY TIME:  12 seconds, 1.7 mGy  TECHNIQUE: After written emergent consent was obtained from ED physician, patient was placed in the supine position on angiographic table. Patency of the right brachial vein was confirmed with ultrasound with image documentation.  An appropriate skin site was determined. Skin site was marked. Region was prepped using maximum barrier technique including cap and mask, sterile gown, sterile gloves, large sterile sheet, and Chlorhexidine as cutaneous antisepsis. The region was infiltrated locally with 1% lidocaine. Under real-time ultrasound guidance, the right brachial vein was accessed with a 21 gauge micropuncture needle; the needle tip within the vein was confirmed with ultrasound image documentation. Needle exchanged over a 018 guidewire for a peel-away sheath, through which a 5-French double-lumen power injectable PICC trimmed to 39cm was advanced, positioned with its tip near the cavoatrial junction. Spot chest radiograph confirms appropriate catheter position. Catheter was flushed per protocol and secured externally. The patient tolerated procedure well.  COMPLICATIONS: COMPLICATIONS none  IMPRESSION: 1. Technically successful five [place] double lumen power injectable PICC placement   Electronically Signed   By: Corlis Leak M.D.   On: 11/12/2014 15:02   Ir US Guide Vasc Access Right  11/12/2014   CLINICAL DATA:  Altered  mental status, metabolic derangement, poor venous access  EXAM: PICC PLACEMENT WITH ULTRASOUND AND FLUOROSCOPY  FLUOROSCOPY TIME:  12 seconds, 1.7 mGy  TECHNIQUE: After written emergent consent was obtained from ED physician, patient was placed in the supine position on angiographic table. Patency of the right brachial vein was confirmed with ultrasound with image documentation. An appropriate skin site was determined. Skin site was marked. Region was prepped using maximum barrier technique including cap and mask, sterile gown, sterile gloves, large sterile sheet, and Chlorhexidine as cutaneous antisepsis. The region was infiltrated locally with 1% lidocaine. Under real-time ultrasound guidance, the right brachial vein was accessed with a 21 gauge micropuncture needle; the needle tip within the vein was confirmed with  ultrasound image documentation. Needle exchanged over a 018 guidewire for a peel-away sheath, through which a 5-French double-lumen power injectable PICC trimmed to 39cm was advanced, positioned with its tip near the cavoatrial junction. Spot chest radiograph confirms appropriate catheter position. Catheter was flushed per protocol and secured externally. The patient tolerated procedure well.  COMPLICATIONS: COMPLICATIONS none  IMPRESSION: 1. Technically successful five [place] double lumen power injectable PICC placement   Electronically Signed   By: Corlis Leak M.D.   On: 11/12/2014 15:02   Dg Chest Port 1 View  11/12/2014   CLINICAL DATA:  Sepsis, seizures, peripheral neuropathy  EXAM: PORTABLE CHEST - 1 VIEW  COMPARISON:  10/23/2014  FINDINGS: Cardiomediastinal silhouette is stable. Mild elevation of the left hemidiaphragm. There is hazy infiltrate in left lower lobe retrocardiac highly suspicious for pneumonia. Right lung is clear. Mild degenerative changes thoracic spine  IMPRESSION: Hazy infiltrate in left lower lobe retrocardiac highly suspicious for pneumonia. Right lung is clear.   Electronically Signed   By: Natasha Mead M.D.   On: 11/12/2014 12:33     ASSESSMENT / PLAN:  PULMONARY Acute respiratory failure  HCAP  P:   abx as below  Supplemental O2 as needed to keep sats >92  Pulmonary hygiene as able  F/u CXR  Sputum culture if able   CARDIOVASCULAR CVL RUA PICC 6/15>>> Severe sepsis  Relative adrenal insufficiency  P:  Stress steroids  Fluid resuscitation F/u lactate  Sepsis protocol  Check cortisol     RENAL Severe hypernatremia  Hypokalemia  AKI  Lactic acidosis  P:   F/u lactate, chem  Volume as above  NS  Place NG for free water    GASTROINTESTINAL Severe protein calorie malnutrition  P:   PPI  Place NG for TF and free water  ?consider PEG    HEMATOLOGIC Thrombocytopenia - chronic (although significant and relatively new since May 2016) P:  SCD's   Avoid heparin for now with significant thrombocytopenia    INFECTIOUS UTI - recurrent  HCAP  P:   BCx2 6/15>>> UC 6/15>>>  Vancomycin 6/15>> Zosyn 6/15>>>   ENDOCRINE Relative adrenal insufficiency  Hyperglycemia - no documented hx DM  P:   SSI  Stress steroids as above  Cortisol level pending    NEUROLOGIC AMS  Progressive neurodegenerative decline - unclear etiology.  F/u at [facility]  P:   Supportive care  Needs goals of care discussion    FAMILY  - Updates:  No family available 6/15 - son updated via phone by RN   - Inter-disciplinary family meet or Palliative Care meeting due by:  6/22     Dirk Dress, NP 11/12/2014  3:35 PM Pager: [phone] or [phone]   Attending:  Chart reviewed, recent hospitalization noted.  Now hospitalized again with a sepsis picture with background of progressive neurologic decline.  She has a left lower lobe infiltrate and a UTI.  Lungs are clear to auscultation, abdomen soft BS+.  CXR images reviewed> LLL pneumonia   Records from [facility] reviewed> recommended hospice back in 04/2014.  I spoke with Berna Spare, her son who has poor insight into her poor prognosis but some level of understanding of the complexity of her condition.  I advised DNR/DNI status, but he says he needs to talk to family members about this first.  We will volume resuscitate, give free water, antibiotics as detailed above.  Continue goals of care conversation  My cc time 40 minutes  Heber Winchester, MD [facility] Pager: [phone] Cell: [phone] After 3pm or if no response, call [phone]

## 2014-11-12 NOTE — ED Notes (Signed)
Lab results reported to Dr.Wentz.

## 2014-11-12 NOTE — Progress Notes (Signed)
ANTIBIOTIC CONSULT NOTE - INITIAL  Pharmacy Consult:  Vancomycin / Zosyn Indication:  HCAP  Allergies  Allergen Reactions  . Citalopram Nausea And Vomiting  . Clindamycin Rash and Other (See Comments)    GI upset  . Duloxetine Hcl Other (See Comments)    GI upset   . Escitalopram Nausea And Vomiting  . Hydromorphone Other (See Comments)    Generalized severe pain  . Morphine Other (See Comments)    Generalized severe pain  . Paroxetine Other (See Comments)    GI upset  . Sertraline Other (See Comments)    GI upset  . Sulfamethoxazole-Trimethoprim Nausea And Vomiting  . Tape Itching, Dermatitis and Rash    Paper tape only.  . Clarithromycin Rash  . Doxycycline Rash and Other (See Comments)    GI upset  . Latex Itching and Rash  . Metoclopramide Anxiety  . Morphine And Related Other (See Comments)    REACTION: Causes pain  . Paroxetine Hcl Other (See Comments)    REACTION:  unknown    Patient Measurements: Weight: 120 lb (54.432 kg)  Vital Signs: Temp: 100.3 F (37.9 C) (06/15 1207) Temp Source: Rectal (06/15 1207) BP: 165/104 mmHg (06/15 1200) Pulse Rate: 151 (06/15 1246)  Labs: No results for input(s): WBC, HGB, PLT, LABCREA, CREATININE in the last 72 hours. Estimated Creatinine Clearance: 69.8 mL/min (by C-G formula based on Cr of 0.58). No results for input(s): VANCOTROUGH, VANCOPEAK, VANCORANDOM, GENTTROUGH, GENTPEAK, GENTRANDOM, TOBRATROUGH, TOBRAPEAK, TOBRARND, AMIKACINPEAK, AMIKACINTROU, AMIKACIN in the last 72 hours.   Microbiology: Recent Results (from the past 720 hour(s))  Urine culture     Status: None   Collection Time: 10/23/14  8:39 AM  Result Value Ref Range Status   Specimen Description URINE, CLEAN CATCH  Final   Special Requests NONE  Final   Colony Count   Final    >=100,000 COLONIES/ML Performed at [facility]    Culture   Final    ESCHERICHIA COLI Performed at [facility]    Report Status 10/25/2014 FINAL  Final    Organism ID, Bacteria ESCHERICHIA COLI  Final      Susceptibility   Escherichia coli - MIC*    AMPICILLIN >=32 RESISTANT Resistant     CEFAZOLIN <=4 SENSITIVE Sensitive     CEFTRIAXONE <=1 SENSITIVE Sensitive     CIPROFLOXACIN >=4 RESISTANT Resistant     GENTAMICIN <=1 SENSITIVE Sensitive     LEVOFLOXACIN >=8 RESISTANT Resistant     NITROFURANTOIN <=16 SENSITIVE Sensitive     TOBRAMYCIN <=1 SENSITIVE Sensitive     TRIMETH/SULFA <=20 SENSITIVE Sensitive     PIP/TAZO 8 SENSITIVE Sensitive     * ESCHERICHIA COLI  Clostridium Difficile by PCR     Status: None   Collection Time: 10/26/14 11:02 AM  Result Value Ref Range Status   C difficile by pcr NEGATIVE NEGATIVE Final  Clostridium Difficile by PCR     Status: None   Collection Time: 10/29/14  1:54 PM  Result Value Ref Range Status   C difficile by pcr NEGATIVE NEGATIVE Final    Medical History: Past Medical History  Diagnosis Date  . Peripheral neuropathy   . Fibromyalgia   . Hypertension   . Anxiety   . Catatonia   . Seizures       Assessment: 27 YOF from [facility] presented with AMS and abnormal lab work.  Pharmacy consulted to initiate vancomycin and Zosyn for PNA on CXR.  Noted PCN  allergy documentation, but patient received multiple doses of Zosyn in January of 2015.  Baseline labs reviewed.   Goal of Therapy:  Vancomycin trough level 15-20 mcg/ml   Plan:  - Vanc 1gm IV x1, then  IV Q24H - Zosyn 3.375gm IV Q8H, 4 hr infusion - Monitor renal fxn, clinical progress, vanc trough as indicated - BMET in AM    Kobee Medlen D. Laney Potash, PharmD, BCPS Pager:  [phone] 11/12/2014, 1:59 PM

## 2014-11-12 NOTE — ED Notes (Signed)
Lab results given to Dr.Wentz. 

## 2014-11-12 NOTE — Sedation Documentation (Signed)
PICC line placed to RUA.

## 2014-11-12 NOTE — Sedation Documentation (Signed)
O2 decreased to 12L

## 2014-11-12 NOTE — ED Notes (Signed)
Dr. Effie Shy aware of patients HR, BP, and lab work. No IV acces at this time. IV team paged.

## 2014-11-12 NOTE — ED Notes (Signed)
NOTIFIED DR. Effie Shy IN PERSON FOR PATIENTS LAB RESULTS OF CG4 LACTIC ACID @16 :35 PN ,11/12/14.

## 2014-11-12 NOTE — ED Notes (Signed)
IV team at bedside attempting access.  

## 2014-11-12 NOTE — ED Notes (Signed)
Dr. Effie Shy and Trula Ore RN signed consent for PICC procedure.

## 2014-11-12 NOTE — Procedures (Signed)
R arm PowerPICC placed under US and fluoroscopy No ptx on spot chest radiograph. No complication No blood loss. See complete dictation in [facility] PACS.  

## 2014-11-12 NOTE — Sedation Documentation (Signed)
O2 decreased to 10L for sats 100% on 12L non rebreather.

## 2014-11-12 NOTE — Sedation Documentation (Addendum)
Pt arrived from ED for PICC line insertion. Received report from [place], [place], from ED. Pt ST 120- 140s on 100% nonrebreather. Awake, non-verbal. Contracted upper extremities noted.

## 2014-11-12 NOTE — ED Notes (Signed)
Patient returned from IR

## 2014-11-12 NOTE — ED Notes (Signed)
Lactic acid 5.8 Critical value

## 2014-11-12 NOTE — ED Notes (Signed)
IV team unsuccessful at starting IV. Dr. Effie Shy notified.

## 2014-11-12 NOTE — ED Notes (Signed)
Dr. Effie Shy informed of patients HR 160's irregular.

## 2014-11-12 NOTE — Progress Notes (Signed)
eLink Physician-Brief Progress Note Patient Name: Joan [name] DOB: 13-Aug-1960 MRN: [id number]   Date of Service  11/12/2014  HPI/Events of Note  Lactic Acid = 5.7 ---> 5.8. Hgb = 7.1. BP = 112/81.  eICU Interventions  Will order: 1. 0.9 NaCl 1 liter IV over 1 hour now.  2. CBC at 9 PM.      Intervention Category Intermediate Interventions: Diagnostic test evaluation  Lenell Antu 11/12/2014, 8:41 PM

## 2014-11-12 NOTE — ED Notes (Signed)
Platelet count 18, paged admitting MD, nurse upstairs notified of patients critical lab values.

## 2014-11-12 NOTE — ED Notes (Signed)
173 Sodium - Critical lab value notification. Dr. Effie Shy notified

## 2014-11-13 ENCOUNTER — Inpatient Hospital Stay (HOSPITAL_COMMUNITY): Payer: Medicare Other

## 2014-11-13 DIAGNOSIS — T8351XD Infection and inflammatory reaction due to indwelling urinary catheter, subsequent encounter: Secondary | ICD-10-CM

## 2014-11-13 DIAGNOSIS — R6521 Severe sepsis with septic shock: Secondary | ICD-10-CM

## 2014-11-13 LAB — BASIC METABOLIC PANEL
ANION GAP: 17 — AB (ref 5–15)
ANION GAP: 8 (ref 5–15)
Anion gap: 13 (ref 5–15)
Anion gap: 10 (ref 5–15)
Anion gap: 5 (ref 5–15)
BUN: 32 mg/dL — ABNORMAL HIGH (ref 6–20)
BUN: 33 mg/dL — ABNORMAL HIGH (ref 6–20)
BUN: 31 mg/dL — ABNORMAL HIGH (ref 6–20)
BUN: 31 mg/dL — ABNORMAL HIGH (ref 6–20)
BUN: 33 mg/dL — ABNORMAL HIGH (ref 6–20)
CALCIUM: 7.8 mg/dL — AB (ref 8.9–10.3)
CHLORIDE: 125 mmol/L — AB (ref 101–111)
CO2: 27 mmol/L (ref 22–32)
CO2: 27 mmol/L (ref 22–32)
CO2: 29 mmol/L (ref 22–32)
CO2: 29 mmol/L (ref 22–32)
CO2: 29 mmol/L (ref 22–32)
CREATININE: 1.21 mg/dL — AB (ref 0.44–1.00)
CREATININE: 1.12 mg/dL — AB (ref 0.44–1.00)
Calcium: 7.8 mg/dL — ABNORMAL LOW (ref 8.9–10.3)
Calcium: 7.9 mg/dL — ABNORMAL LOW (ref 8.9–10.3)
Calcium: 8 mg/dL — ABNORMAL LOW (ref 8.9–10.3)
Calcium: 8.1 mg/dL — ABNORMAL LOW (ref 8.9–10.3)
Chloride: 126 mmol/L — ABNORMAL HIGH (ref 101–111)
Chloride: 128 mmol/L — ABNORMAL HIGH (ref 101–111)
Chloride: 128 mmol/L — ABNORMAL HIGH (ref 101–111)
Chloride: 128 mmol/L — ABNORMAL HIGH (ref 101–111)
Creatinine, Ser: 1.21 mg/dL — ABNORMAL HIGH (ref 0.44–1.00)
Creatinine, Ser: 1.13 mg/dL — ABNORMAL HIGH (ref 0.44–1.00)
Creatinine, Ser: 1.06 mg/dL — ABNORMAL HIGH (ref 0.44–1.00)
GFR calc Af Amer: 58 mL/min — ABNORMAL LOW (ref >60)
GFR calc Af Amer: 58 mL/min — ABNORMAL LOW (ref >60)
GFR calc Af Amer: >60 mL/min (ref >60)
GFR calc non Af Amer: 50 mL/min — ABNORMAL LOW (ref >60)
GFR calc non Af Amer: 55 mL/min — ABNORMAL LOW (ref >60)
GFR calc non Af Amer: 59 mL/min — ABNORMAL LOW (ref >60)
GFR, EST NON AFRICAN AMERICAN: 50 mL/min — AB (ref >60)
GFR, EST NON AFRICAN AMERICAN: 54 mL/min — AB (ref >60)
GLUCOSE: 157 mg/dL — AB (ref 65–99)
GLUCOSE: 176 mg/dL — AB (ref 65–99)
Glucose, Bld: 140 mg/dL — ABNORMAL HIGH (ref 65–99)
Glucose, Bld: 165 mg/dL — ABNORMAL HIGH (ref 65–99)
Glucose, Bld: 74 mg/dL (ref 65–99)
POTASSIUM: 3.2 mmol/L — AB (ref 3.5–5.1)
Potassium: 3.1 mmol/L — ABNORMAL LOW (ref 3.5–5.1)
Potassium: 3.7 mmol/L (ref 3.5–5.1)
Potassium: 3.5 mmol/L (ref 3.5–5.1)
Potassium: 3.8 mmol/L (ref 3.5–5.1)
SODIUM: 168 mmol/L — AB (ref 135–145)
SODIUM: 164 mmol/L — AB (ref 135–145)
SODIUM: 162 mmol/L — AB (ref 135–145)
Sodium: 170 mmol/L (ref 135–145)
Sodium: 165 mmol/L (ref 135–145)

## 2014-11-13 LAB — CBC
HCT: 28.7 % — ABNORMAL LOW (ref 36.0–46.0)
HEMOGLOBIN: 8.8 g/dL — AB (ref 12.0–15.0)
MCH: 37.8 pg — ABNORMAL HIGH (ref 26.0–34.0)
MCHC: 30.7 g/dL (ref 30.0–36.0)
MCV: 123.2 fL — ABNORMAL HIGH (ref 78.0–100.0)
Platelets: 22 10*3/uL — CL (ref 150–400)
RBC: 2.33 MIL/uL — AB (ref 3.87–5.11)
RDW: 18.6 % — ABNORMAL HIGH (ref 11.5–15.5)
WBC: 6.4 10*3/uL (ref 4.0–10.5)

## 2014-11-13 LAB — HEMOGLOBIN A1C
Hgb A1c MFr Bld: 4.4 % — ABNORMAL LOW (ref 4.8–5.6)
Mean Plasma Glucose: 80 mg/dL

## 2014-11-13 LAB — GLUCOSE, CAPILLARY
GLUCOSE-CAPILLARY: 126 mg/dL — AB (ref 65–99)
GLUCOSE-CAPILLARY: 74 mg/dL (ref 65–99)
Glucose-Capillary: 102 mg/dL — ABNORMAL HIGH (ref 65–99)
Glucose-Capillary: 130 mg/dL — ABNORMAL HIGH (ref 65–99)
Glucose-Capillary: 153 mg/dL — ABNORMAL HIGH (ref 65–99)
Glucose-Capillary: 52 mg/dL — ABNORMAL LOW (ref 65–99)
Glucose-Capillary: 59 mg/dL — ABNORMAL LOW (ref 65–99)
Glucose-Capillary: 62 mg/dL — ABNORMAL LOW (ref 65–99)
Glucose-Capillary: 68 mg/dL (ref 65–99)
Glucose-Capillary: 73 mg/dL (ref 65–99)
Glucose-Capillary: 88 mg/dL (ref 65–99)

## 2014-11-13 LAB — PROCALCITONIN: Procalcitonin: 1.36 ng/mL

## 2014-11-13 MED ORDER — SODIUM CHLORIDE 0.9 % IV SOLN
INTRAVENOUS | Status: DC
Start: 2014-11-13 — End: 2014-11-13

## 2014-11-13 MED ORDER — DEXTROSE 50 % IV SOLN
INTRAVENOUS | Status: AC
  Filled 2014-11-13: qty 50

## 2014-11-13 MED ORDER — FREE WATER
200.0000 mL | Status: DC
  Administered 2014-11-13 – 2014-11-14 (×7): 200 mL

## 2014-11-13 MED ORDER — DEXTROSE-NACL 5-0.45 % IV SOLN
INTRAVENOUS | Status: DC
  Administered 2014-11-14 – 2014-11-15 (×2): via INTRAVENOUS
  Administered 2014-11-15: 1000 mL via INTRAVENOUS
  Administered 2014-11-16 – 2014-11-17 (×4): via INTRAVENOUS

## 2014-11-13 MED ORDER — POTASSIUM CHLORIDE 20 MEQ/15ML (10%) PO SOLN
40.0000 meq | Freq: Once | ORAL | Status: AC
  Administered 2014-11-13: 40 meq
  Filled 2014-11-13: qty 30

## 2014-11-13 MED ORDER — DEXTROSE 50 % IV SOLN
12.5000 g | Freq: Once | INTRAVENOUS | Status: AC
  Administered 2014-11-13: 12.5 g via INTRAVENOUS

## 2014-11-13 MED ORDER — MUPIROCIN 2 % EX OINT
1.0000 "application " | TOPICAL_OINTMENT | Freq: Two times a day (BID) | CUTANEOUS | Status: AC
  Administered 2014-11-13 – 2014-11-18 (×10): 1 via NASAL
  Filled 2014-11-13 (×3): qty 22

## 2014-11-13 MED ORDER — DEXTROSE-NACL 5-0.45 % IV SOLN
INTRAVENOUS | Status: DC
  Administered 2014-11-13: 1000 mL via INTRAVENOUS
  Administered 2014-11-13: 01:00:00 via INTRAVENOUS

## 2014-11-13 MED ORDER — CHLORHEXIDINE GLUCONATE CLOTH 2 % EX PADS
6.0000 | MEDICATED_PAD | Freq: Every day | CUTANEOUS | Status: AC
  Administered 2014-11-14 – 2014-11-18 (×5): 6 via TOPICAL

## 2014-11-13 MED ORDER — DEXTROSE 50 % IV SOLN
25.0000 mL | Freq: Once | INTRAVENOUS | Status: AC
  Administered 2014-11-13: 25 mL via INTRAVENOUS
  Filled 2014-11-13: qty 50

## 2014-11-13 NOTE — Progress Notes (Signed)
CRITICAL VALUE ALERT  Critical value received:  Na 165  Date of notification:  11/13/2014  Time of notification:  12:50 PM  Critical value read back:Yes.    Nurse who received alert:  Layne Benton, RN  MD notified (1st page):  Dr. Molli Knock  Time of first page:  12:50 PM   MD notified (2nd page):  Time of second page:  Responding MD:  Dr. Molli Knock  Time MD responded:  12:50 PM

## 2014-11-13 NOTE — Progress Notes (Signed)
Hypoglycemic Event  CBG:68  Treatment: D50 IV 25 mL  Symptoms: None  Follow-up CBG: Time:0100 CBG Result:102  Possible Reasons for Event: Inadequate meal intake  Comments/MD notified:E-link    Darrel Hoover

## 2014-11-13 NOTE — Clinical Social Work Note (Signed)
Clinical Social Worker spoke with patient's son at length in reference to post-acute placement and completed psychosocial assessment. Full psychosocial assessment to follow.   Derenda Fennel, MSW, LCSWA [phone] 11/13/2014 3:46 PM

## 2014-11-13 NOTE — Progress Notes (Signed)
CRITICAL VALUE ALERT  Critical value received:  Na 164  Date of notification:  11/13/2014  Time of notification:  10:00 AM  Critical value read back:Yes.    Nurse who received alert:  Layne Benton, RN  MD notified (1st page):  Dr. Molli Knock  Time of first page:  10:00 AM  MD notified (2nd page):  Time of second page:  Responding MD:  Dr. Molli Knock  Time MD responded:  10:00 AM

## 2014-11-13 NOTE — Progress Notes (Signed)
eLink Physician-Brief Progress Note Patient Name: Joan [name] DOB: [birth year] MRN: [id number]   Date of Service  11/13/2014  HPI/Events of Note  k low  eICU Interventions  k     Intervention Category Intermediate Interventions: Electrolyte abnormality - evaluation and management  Nelda Bucks. 11/13/2014, 6:35 AM

## 2014-11-13 NOTE — Progress Notes (Signed)
Received Na value of 162.  Level is trending down, MD is aware.

## 2014-11-13 NOTE — Progress Notes (Signed)
eLink Physician-Brief Progress Note Patient Name: Mahathi Wiechman DOB: 1960-06-17 MRN: [id number]   Date of Service  11/13/2014  HPI/Events of Note  slioght diff pupil size per rn   eICU Interventions  Has catotonic dz        Nelda Bucks. 11/13/2014, 5:14 AM

## 2014-11-13 NOTE — Progress Notes (Signed)
eLink Physician-Brief Progress Note Patient Name: Joan [name] DOB: 07-30-1960 MRN: [id number]   Date of Service  11/13/2014  HPI/Events of Note  Hypoglycemia x 2  eICU Interventions  Add d5 1/2 NS     Intervention Category Intermediate Interventions: Hyperglycemia - evaluation and treatment  FEINSTEIN,DANIEL J. 11/13/2014, 11:59 PM

## 2014-11-13 NOTE — H&P (Signed)
PULMONARY / CRITICAL CARE MEDICINE   Name: Joan [name] MRN: [id number] DOB: 03/05/1961    ADMISSION DATE:  11/12/2014  REFERRING MD :  EDP  CHIEF COMPLAINT:  Sepsis   INITIAL PRESENTATION: 54yo female SNF resident with hx catatonia, progressive neurodegenerative decline of unclear diagnosis on chronic steroids, functional quadriplegia,  seizures, HTN, nonverbal/contracted at baseline.  Recent hospitalization for recurrent UTI [facility]) in setting chronic foley.  Presented 6/15 from SNF r/t abnormal lab results including hypernatremia and hypokalemia.  In ER found to be hypotensive with lactate=9, CXR with PNA, U/a suspicious for UTI and PCCM called to admit.    STUDIES:    SIGNIFICANT EVENTS:   SUBJECTIVE: No events overnight, remains catatonic and contracted  VITAL SIGNS: Temp:  [97.3 F (36.3 C)-100.3 F (37.9 C)] 97.7 F (36.5 C) (06/16 0825) Pulse Rate:  [25-151] 25 (06/16 0215) Resp:  [15-43] 25 (06/16 1100) BP: (77-251)/(17-202) 123/84 mmHg (06/16 1100) SpO2:  [76 %-100 %] 100 % (06/16 0815) FiO2 (%):  [100 %] 100 % (06/15 1407) Weight:  [54.432 kg (120 lb)-58.9 kg (129 lb 13.6 oz)] 58.9 kg (129 lb 13.6 oz) (06/16 0456) HEMODYNAMICS:   VENTILATOR SETTINGS: Vent Mode:  [-]  FiO2 (%):  [100 %] 100 % INTAKE / OUTPUT:  Intake/Output Summary (Last 24 hours) at 11/13/14 1137 Last data filed at 11/13/14 1106  Gross per 24 hour  Intake 2764.17 ml  Output    180 ml  Net 2584.17 ml    PHYSICAL EXAMINATION: General:  Chronically ill appearing female, NAD  Neuro:  Opens eyes spontaneously, does not track or follow commands, contracted.  Catatonia at baseline.  HEENT:  Mm dry, no JVD, NRB  Cardiovascular:  Irreg, tachy Lungs:  resps even, non labored on NRB, coarse Abdomen:  Soft, +bs Musculoskeletal:  Warm and dry, no sig edema, bilat foot drop boots  LABS:  CBC  Recent Labs Lab 11/12/14 1614 11/12/14 2120 11/13/14 0430  WBC 4.5 9.1 6.4  HGB 7.1* 10.2* 8.8*   HCT 23.4* 33.3* 28.7*  PLT 18* 28* 22*   Coag's  Recent Labs Lab 11/12/14 1614  APTT 40*  INR 3.48*   BMET  Recent Labs Lab 11/13/14 0125 11/13/14 0430 11/13/14 0918  NA 170* 168* 164*  K 3.2* 3.1* 3.7  CL 126* 128* 125*  CO2 27 27 29   BUN 32* 33* 31*  CREATININE 1.21* 1.21* 1.12*  GLUCOSE 140* 157* 165*   Electrolytes  Recent Labs Lab 11/12/14 1614  11/13/14 0125 11/13/14 0430 11/13/14 0918  CALCIUM 5.3*  < > 7.8* 7.8* 7.9*  MG 1.5*  --   --   --   --   PHOS 1.8*  --   --   --   --   < > = values in this interval not displayed. Sepsis Markers  Recent Labs Lab 11/12/14 1614 11/12/14 1628 11/12/14 1900 11/13/14 0430  LATICACIDVEN 5.8* 5.78* 5.8*  --   PROCALCITON 0.61  --   --  1.36   ABG No results for input(s): PHART, PCO2ART, PO2ART in the last 168 hours. Liver Enzymes  Recent Labs Lab 11/12/14 1205 11/12/14 1614  AST 77* 47*  ALT 32 19  ALKPHOS 82 39  BILITOT 1.3* 0.6  ALBUMIN 2.0* <1.0*   Cardiac Enzymes  Recent Labs Lab 11/12/14 1614  TROPONINI 0.51*   Glucose  Recent Labs Lab 11/12/14 1807 11/12/14 2024 11/13/14 0013 11/13/14 0056 11/13/14 0358 11/13/14 0822  GLUCAP 113* 78 68 102* 126* 130*  Imaging Ir Fluoro Guide Cv Line Right  11/12/2014   CLINICAL DATA:  Altered mental status, metabolic derangement, poor venous access  EXAM: PICC PLACEMENT WITH ULTRASOUND AND FLUOROSCOPY  FLUOROSCOPY TIME:  12 seconds, 1.7 mGy  TECHNIQUE: After written emergent consent was obtained from ED physician, patient was placed in the supine position on angiographic table. Patency of the right brachial vein was confirmed with ultrasound with image documentation. An appropriate skin site was determined. Skin site was marked. Region was prepped using maximum barrier technique including cap and mask, sterile gown, sterile gloves, large sterile sheet, and Chlorhexidine as cutaneous antisepsis. The region was infiltrated locally with 1% lidocaine.  Under real-time ultrasound guidance, the right brachial vein was accessed with a 21 gauge micropuncture needle; the needle tip within the vein was confirmed with ultrasound image documentation. Needle exchanged over a 018 guidewire for a peel-away sheath, through which a 5-French double-lumen power injectable PICC trimmed to 39cm was advanced, positioned with its tip near the cavoatrial junction. Spot chest radiograph confirms appropriate catheter position. Catheter was flushed per protocol and secured externally. The patient tolerated procedure well.  COMPLICATIONS: COMPLICATIONS none  IMPRESSION: 1. Technically successful five [place] double lumen power injectable PICC placement   Electronically Signed   By: Corlis Leak M.D.   On: 11/12/2014 15:02   Ir US Guide Vasc Access Right  11/12/2014   CLINICAL DATA:  Altered mental status, metabolic derangement, poor venous access  EXAM: PICC PLACEMENT WITH ULTRASOUND AND FLUOROSCOPY  FLUOROSCOPY TIME:  12 seconds, 1.7 mGy  TECHNIQUE: After written emergent consent was obtained from ED physician, patient was placed in the supine position on angiographic table. Patency of the right brachial vein was confirmed with ultrasound with image documentation. An appropriate skin site was determined. Skin site was marked. Region was prepped using maximum barrier technique including cap and mask, sterile gown, sterile gloves, large sterile sheet, and Chlorhexidine as cutaneous antisepsis. The region was infiltrated locally with 1% lidocaine. Under real-time ultrasound guidance, the right brachial vein was accessed with a 21 gauge micropuncture needle; the needle tip within the vein was confirmed with ultrasound image documentation. Needle exchanged over a 018 guidewire for a peel-away sheath, through which a 5-French double-lumen power injectable PICC trimmed to 39cm was advanced, positioned with its tip near the cavoatrial junction. Spot chest radiograph confirms appropriate  catheter position. Catheter was flushed per protocol and secured externally. The patient tolerated procedure well.  COMPLICATIONS: COMPLICATIONS none  IMPRESSION: 1. Technically successful five [place] double lumen power injectable PICC placement   Electronically Signed   By: Corlis Leak M.D.   On: 11/12/2014 15:02   Dg Chest Port 1 View  11/13/2014   CLINICAL DATA:  Pneumonia.  EXAM: PORTABLE CHEST - 1 VIEW  COMPARISON:  11/12/2014.  FINDINGS: Limited exam is patient's upper extremities are overlying the chest. NG tube noted with tip coiled in stomach. Right PICC line noted with tip at cavoatrial junction. Mediastinum and hilar structures are unremarkable. Heart size is stable. Low lung volumes with bibasilar atelectasis. No prominent pleural effusion. No pneumothorax.  IMPRESSION: 1. Limited exam is a patient's upper extremities are projected over the chest. Repeat exam can be obtained. Lines and tubes as above. 2. Low lung volumes with bibasilar subsegmental atelectasis.   Electronically Signed   By: Maisie Fus  Register   On: 11/13/2014 07:22   Dg Chest Port 1 View  11/12/2014   CLINICAL DATA:  Sepsis, seizures, peripheral neuropathy  EXAM: PORTABLE CHEST -  1 VIEW  COMPARISON:  10/23/2014  FINDINGS: Cardiomediastinal silhouette is stable. Mild elevation of the left hemidiaphragm. There is hazy infiltrate in left lower lobe retrocardiac highly suspicious for pneumonia. Right lung is clear. Mild degenerative changes thoracic spine  IMPRESSION: Hazy infiltrate in left lower lobe retrocardiac highly suspicious for pneumonia. Right lung is clear.   Electronically Signed   By: Natasha Mead M.D.   On: 11/12/2014 12:33   Dg Abd Portable 1v  11/13/2014   CLINICAL DATA:  Nasogastric tube placement.  Initial encounter.  EXAM: PORTABLE ABDOMEN - 1 VIEW  COMPARISON:  Abdominal radiograph from 11/15/2013, and CT of the abdomen and pelvis performed 10/30/2014  FINDINGS: The patient's enteric tube is noted coiling within the  body of the stomach, ending at the fundus of the stomach.  The visualized bowel gas pattern is grossly unremarkable. A small amount of air and stool seen within the colon. No free intra-abdominal air is identified, though evaluation for free air is limited on a single supine view.  No acute osseous abnormalities are identified. Left basilar airspace opacity is better characterized on recent chest radiograph.  IMPRESSION: Enteric tube noted coiling within the body of the stomach, ending at the fundus of the stomach.   Electronically Signed   By: Roanna Raider M.D.   On: 11/13/2014 02:01     ASSESSMENT / PLAN:  PULMONARY Acute respiratory failure  HCAP  P:   abx as below  Supplemental O2 as needed to keep sats >92  Pulmonary hygiene as able  F/u CXR  Sputum culture if able   CARDIOVASCULAR CVL RUA PICC 6/15>>> Severe sepsis  Relative adrenal insufficiency  P:  Stress steroids  Fluid resuscitation F/u lactate  Sepsis protocol  Check cortisol 19.9 Continue stress dose steroids.  RENAL Severe hypernatremia  Hypokalemia  AKI  Lactic acidosis  P:   Volume as above  KVO IVF Increase free water to 200 q4.   GASTROINTESTINAL Severe protein calorie malnutrition  P:   PPI  Place NG for TF and free water  ?consider PEG    HEMATOLOGIC Thrombocytopenia - chronic (although significant and relatively new since May 2016) P:  SCD's  Avoid heparin for now with significant thrombocytopenia    INFECTIOUS UTI - recurrent  HCAP  P:   BCx2 6/15>>> UC 6/15>>>  Vancomycin 6/15>> Zosyn 6/15>>>   ENDOCRINE Relative adrenal insufficiency  Hyperglycemia - no documented hx DM  P:   SSI  Stress steroids as above  Cortisol level pending   NEUROLOGIC AMS  Progressive neurodegenerative decline - unclear etiology.  F/u at [facility]  P:   Supportive care  Needs goals of care discussion  Minimize sedation.  FAMILY  - Updates:  No family available 6/15 - son updated via phone  with sister standing infront of me, he became very irate when I explained that chances of complete improvement here are very poor and insisted on full code status, accused me of calling him a liar and called me rude and asked that [facility].  I explained to him that transfer initiated by family request with no medical necessity involves the family providing an accepting MD and an available room.  His aunt was standing right infront of me during this conversation and verified that I said nothing of the sort.  She apologized for his behavior and informed that he has been dealing with this for 6 years and is very frustrated and again apologized to me.  I told her no need for apology and that I understand.  She remains full code.  - Inter-disciplinary family meet or Palliative Care meeting due by:  6/22  Transfer to SDU and to [facility], PCCM off.  Alyson Reedy, M.D. [facility] Pulmonary/Critical Care Medicine. Pager: [phone]. After hours pager: [phone]

## 2014-11-13 NOTE — Progress Notes (Signed)
eLink Physician-Brief Progress Note Patient Name: Joan [name] DOB: 1960/10/11 MRN: [id number]   Date of Service  11/13/2014  HPI/Events of Note  Glu borderline low treated Na rise  eICU Interventions  Change fluids to d51/2 NS, bmet q4h to follow na     Intervention Category Intermediate Interventions: Electrolyte abnormality - evaluation and management  FEINSTEIN,DANIEL J. 11/13/2014, 1:14 AM

## 2014-11-13 NOTE — Progress Notes (Signed)
CRITICAL VALUE ALERT  Critical value received:  Na+ 168  Date of notification:  11/13/14  Time of notification:  0630  Critical value read back:Yes.    Nurse who received alert:  Stefani Dama  MD notified (1st page):  Dr. Tyson Alias  Time of first page:  0630  MD notified (2nd page):  Time of second page:  Responding MD:  Dr. Tyson Alias  Time MD responded:  0630

## 2014-11-13 NOTE — Progress Notes (Signed)
Hypoglycemic Event  CBG: 52  Treatment: D50 IV 25 mL  Symptoms: None  Follow-up CBG: Time: 1945 CBG Result: 73  Possible Reasons for Event: Unknown  Comments/MD notified:    Laurence Folz, Pearla Dubonnet  Remember to initiate Hypoglycemia Order Set & complete

## 2014-11-14 ENCOUNTER — Inpatient Hospital Stay (HOSPITAL_COMMUNITY): Payer: Medicare Other

## 2014-11-14 DIAGNOSIS — R569 Unspecified convulsions: Secondary | ICD-10-CM

## 2014-11-14 DIAGNOSIS — N179 Acute kidney failure, unspecified: Secondary | ICD-10-CM | POA: Diagnosis present

## 2014-11-14 DIAGNOSIS — G9341 Metabolic encephalopathy: Secondary | ICD-10-CM | POA: Diagnosis present

## 2014-11-14 DIAGNOSIS — D638 Anemia in other chronic diseases classified elsewhere: Secondary | ICD-10-CM | POA: Diagnosis present

## 2014-11-14 DIAGNOSIS — IMO0001 Reserved for inherently not codable concepts without codable children: Secondary | ICD-10-CM | POA: Diagnosis present

## 2014-11-14 DIAGNOSIS — R4182 Altered mental status, unspecified: Secondary | ICD-10-CM

## 2014-11-14 DIAGNOSIS — G0490 Encephalitis and encephalomyelitis, unspecified: Secondary | ICD-10-CM

## 2014-11-14 DIAGNOSIS — E2740 Unspecified adrenocortical insufficiency: Secondary | ICD-10-CM | POA: Diagnosis present

## 2014-11-14 DIAGNOSIS — D696 Thrombocytopenia, unspecified: Secondary | ICD-10-CM

## 2014-11-14 DIAGNOSIS — E876 Hypokalemia: Secondary | ICD-10-CM

## 2014-11-14 DIAGNOSIS — T68XXXA Hypothermia, initial encounter: Secondary | ICD-10-CM

## 2014-11-14 DIAGNOSIS — E43 Unspecified severe protein-calorie malnutrition: Secondary | ICD-10-CM | POA: Diagnosis present

## 2014-11-14 LAB — VITAMIN B12: Vitamin B-12: 3269 pg/mL — ABNORMAL HIGH (ref 180–914)

## 2014-11-14 LAB — GLUCOSE, CAPILLARY
GLUCOSE-CAPILLARY: 105 mg/dL — AB (ref 65–99)
GLUCOSE-CAPILLARY: 138 mg/dL — AB (ref 65–99)
GLUCOSE-CAPILLARY: 142 mg/dL — AB (ref 65–99)
Glucose-Capillary: 107 mg/dL — ABNORMAL HIGH (ref 65–99)
Glucose-Capillary: 119 mg/dL — ABNORMAL HIGH (ref 65–99)
Glucose-Capillary: 152 mg/dL — ABNORMAL HIGH (ref 65–99)

## 2014-11-14 LAB — PROCALCITONIN: PROCALCITONIN: 0.74 ng/mL

## 2014-11-14 LAB — IRON AND TIBC: Iron: 71 ug/dL (ref 28–170)

## 2014-11-14 LAB — RETICULOCYTES
RBC.: 2.22 MIL/uL — AB (ref 3.87–5.11)
RETIC CT PCT: 2.7 % (ref 0.4–3.1)
Retic Count, Absolute: 59.9 10*3/uL (ref 19.0–186.0)

## 2014-11-14 LAB — BASIC METABOLIC PANEL
ANION GAP: 4 — AB (ref 5–15)
BUN: 32 mg/dL — ABNORMAL HIGH (ref 6–20)
CALCIUM: 8.1 mg/dL — AB (ref 8.9–10.3)
CO2: 30 mmol/L (ref 22–32)
CREATININE: 1.09 mg/dL — AB (ref 0.44–1.00)
Chloride: 123 mmol/L — ABNORMAL HIGH (ref 101–111)
GFR, EST NON AFRICAN AMERICAN: 57 mL/min — AB (ref >60)
Glucose, Bld: 115 mg/dL — ABNORMAL HIGH (ref 65–99)
Potassium: 3.4 mmol/L — ABNORMAL LOW (ref 3.5–5.1)
Sodium: 157 mmol/L — ABNORMAL HIGH (ref 135–145)

## 2014-11-14 LAB — MAGNESIUM: Magnesium: 2 mg/dL (ref 1.7–2.4)

## 2014-11-14 LAB — CBC
HEMATOCRIT: 29.3 % — AB (ref 36.0–46.0)
Hemoglobin: 9 g/dL — ABNORMAL LOW (ref 12.0–15.0)
MCH: 37.7 pg — ABNORMAL HIGH (ref 26.0–34.0)
MCHC: 30.7 g/dL (ref 30.0–36.0)
MCV: 122.6 fL — AB (ref 78.0–100.0)
Platelets: 19 10*3/uL — CL (ref 150–400)
RBC: 2.39 MIL/uL — ABNORMAL LOW (ref 3.87–5.11)
RDW: 18.5 % — AB (ref 11.5–15.5)
WBC: 5.8 10*3/uL (ref 4.0–10.5)

## 2014-11-14 LAB — PHOSPHORUS: Phosphorus: 2.2 mg/dL — ABNORMAL LOW (ref 2.5–4.6)

## 2014-11-14 LAB — FERRITIN: Ferritin: 3303 ng/mL — ABNORMAL HIGH (ref 11–307)

## 2014-11-14 LAB — FOLATE: FOLATE: 12.1 ng/mL (ref >5.9)

## 2014-11-14 LAB — LACTIC ACID, PLASMA: Lactic Acid, Venous: 2.8 mmol/L (ref 0.5–2.0)

## 2014-11-14 MED ORDER — SODIUM CHLORIDE 0.9 % IV BOLUS (SEPSIS)
500.0000 mL | Freq: Once | INTRAVENOUS | Status: AC
  Administered 2014-11-14: 500 mL via INTRAVENOUS

## 2014-11-14 MED ORDER — CLONAZEPAM 0.5 MG PO TABS
0.2500 mg | ORAL_TABLET | Freq: Two times a day (BID) | ORAL | Status: DC
  Administered 2014-11-14 – 2014-11-19 (×10): 0.25 mg via ORAL
  Filled 2014-11-14 (×10): qty 1

## 2014-11-14 MED ORDER — VANCOMYCIN HCL IN DEXTROSE 750-5 MG/150ML-% IV SOLN
750.0000 mg | INTRAVENOUS | Status: DC
  Administered 2014-11-14 – 2014-11-17 (×4): 750 mg via INTRAVENOUS
  Filled 2014-11-14 (×5): qty 150

## 2014-11-14 MED ORDER — VALPROATE SODIUM 500 MG/5ML IV SOLN
750.0000 mg | Freq: Three times a day (TID) | INTRAVENOUS | Status: DC
  Administered 2014-11-14 – 2014-11-19 (×13): 750 mg via INTRAVENOUS
  Filled 2014-11-14 (×18): qty 7.5

## 2014-11-14 MED ORDER — FLUCONAZOLE IN SODIUM CHLORIDE 200-0.9 MG/100ML-% IV SOLN
200.0000 mg | Freq: Once | INTRAVENOUS | Status: AC
  Administered 2014-11-14: 200 mg via INTRAVENOUS
  Filled 2014-11-14: qty 100

## 2014-11-14 MED ORDER — CLONAZEPAM 0.1 MG/ML ORAL SUSPENSION: 0.2500 mg | Freq: Two times a day (BID) | ORAL | Status: DC

## 2014-11-14 MED ORDER — FLUCONAZOLE 100MG IVPB
100.0000 mg | INTRAVENOUS | Status: DC
  Administered 2014-11-15 – 2014-11-18 (×4): 100 mg via INTRAVENOUS
  Filled 2014-11-14 (×5): qty 50

## 2014-11-14 MED ORDER — POTASSIUM PHOSPHATES 15 MMOLE/5ML IV SOLN
20.0000 mmol | Freq: Once | INTRAVENOUS | Status: AC
  Administered 2014-11-14: 20 mmol via INTRAVENOUS
  Filled 2014-11-14: qty 6.67

## 2014-11-14 MED ORDER — FREE WATER
250.0000 mL | Status: DC
  Administered 2014-11-14 – 2014-11-17 (×18): 250 mL

## 2014-11-14 NOTE — Progress Notes (Signed)
Initial Nutrition Assessment  INTERVENTION:  Recommend initiating nutrition support within 24 - 48 hr Initiate TF via NGT with Osmolite 1.2  at 25 ml/h on day 1; on day 2, increase to goal rate of 55 ml/h (1320 ml per day) to provide 1584 kcals, 73 gm protein, 1082 ml free water daily.  NUTRITION DIAGNOSIS:  Inadequate oral intake related to inability to eat as evidenced by NPO status.  GOAL:  Patient will meet greater than or equal to 90% of their needs  MONITOR:  Diet advancement, Labs, Weight trends, Skin, I & O's  REASON FOR ASSESSMENT:  Low Braden    ASSESSMENT: 54yo female SNF resident with hx catatonia, progressive neurodegenerative decline of unclear diagnosis on chronic steroids, functional quadriplegia, seizures, HTN, nonverbal/contracted at baseline.  Discussed pt with RN. Per RN it is unclear what the care plan for this pt is as pt's son does not agree what the MD is suggesting. Unsure what pt's PO was PTA considering her medical condition, but per admission notes, she was on pureed foods diet and/or adult oral supplements. Pt has NG tube, will leave recs in case pt is unable to take PO, no speech evaluation is is currently scheduled. Per MD notes, pt may be receiving a PEG. Will continue to monitor. Labs reviewed: Na 157, K 3.4, Glu 74 - 115, BUN 32, Cr 1.09, Ca 8.1     Height:  Ht Readings from Last 1 Encounters:  11/14/14 5\' 5"  (1.651 m)    Weight:  Wt Readings from Last 1 Encounters:  11/14/14 130 lb 1.1 oz (59 kg)    Ideal Body Weight:  56.8 kg  Wt Readings from Last 10 Encounters:  11/14/14 130 lb 1.1 oz (59 kg)  10/24/14 123 lb 7.3 oz (56 kg)  10/03/14 129 lb 3 oz (58.6 kg)  12/04/13 164 lb 10.9 oz (74.7 kg)  08/27/13 153 lb 11.2 oz (69.718 kg)  07/30/13 181 lb 14.1 oz (82.5 kg)  07/24/13 170 lb 11.2 oz (77.429 kg)  07/16/13 182 lb 12.2 oz (82.9 kg)  05/17/13 154 lb 12.2 oz (70.2 kg)    BMI:  Body mass index is 21.65 kg/(m^2).  Estimated  Nutritional Needs:  Kcal:  1400 - 1600  Protein:  65 - 80 g  Fluid:  > 1.5 L  Skin:   surgical incision on breast, dehisced wound bilateral buttocks  Diet Order:   NPO  EDUCATION NEEDS:  Education needs no appropriate at this time   Intake/Output Summary (Last 24 hours) at 11/14/14 1209 Last data filed at 11/14/14 1100  Gross per 24 hour  Intake   2080 ml  Output    400 ml  Net   1680 ml    Last BM:  PTA  Kesi Perrow A. [facility] Dietetic Intern Pager: [phone] 11/14/2014 12:24 PM

## 2014-11-14 NOTE — Progress Notes (Signed)
eLink Physician-Brief Progress Note Patient Name: Joan [name] DOB: Mar 08, 1961 MRN: [id number]   Date of Service  11/14/2014  HPI/Events of Note  k phos  eICU Interventions       Intervention Category Intermediate Interventions: Electrolyte abnormality - evaluation and management  Nelda Bucks. 11/14/2014, 6:19 AM

## 2014-11-14 NOTE — Progress Notes (Signed)
STAT EEG completed; results pending. 

## 2014-11-14 NOTE — Clinical Social Work Note (Signed)
Clinical Social Work Assessment  Patient Details  Name: Makenna Amoroso MRN: [id number] Date of Birth: Sep 06, 1960  Date of referral:  11/14/14               Reason for consult:  Discharge Planning                Permission sought to share information with:  Case Manager, Facility Medical sales representative, PCP, Family Supports Permission granted to share information::  Yes, Verbal Permission Granted  Name::      Doylene Bode)  Agency::   (SNF's [facility] )  Relationship::   (Son)  Contact Information:   [phone])  Housing/Transportation Living arrangements for the past 2 months:  Skilled Nursing Facility, Single Family Home Source of Information:  Adult Children Patient Interpreter Needed:  None Criminal Activity/Legal Involvement Pertinent to Current Situation/Hospitalization:  No - Comment as needed Significant Relationships:  Adult Children Lives with:  Adult Children Do you feel safe going back to the place where you live?  Yes Need for family participation in patient care:  Yes (Comment)  Care giving concerns:  No care giving concerns reported at this time.    Social Worker assessment / plan:  Visual merchandiser spoke with patient's son, Berna Spare at [employer] in reference to post-acute placement for SNF/ pt's return to [facility]. Pt's son reported patient has been at [facility] for about 1 week before returning to [facility]. Pt's son also stated he is interested in pt transferring to [facility] to follow up with her neurologist. CSW explained to pt's son that he would need to relate concerns/request in regards to acute-acute transfers with attending MD. Pt's son stated he would follow up with MD with request for patient to be transferred to [facility]. CSW further explained discharge planning back to SNF and pt's son is agreeable with pt returning to [facility]. No further concerns reported by family. CSW to update  FL-2, fax to [facility] and place on chart for MD signature. CSW will continue to follow pt and pt's family for continued support and to facilitate pt's discharge needs.   Employment status:  Disabled (Comment on whether or not currently receiving Disability) Insurance information:  Managed Medicare PT Recommendations:  Skilled Nursing Facility Information / Referral to community resources:  Skilled Nursing Facility  Patient/Family's Response to care:  Pt lying in bed disoriented. Pt's son requesting acute to acute transfer to [facility]. CSW advised pt's son to discuss request with MD. Pt's son agreeable to pt returning to SNF, [facility].  Patient/Family's Understanding of and Emotional Response to Diagnosis, Current Treatment, and Prognosis:  CSW advised pt's son to contact bedside nurse/MD for medical updates and current treatment. CSW remains available.   Emotional Assessment Appearance:  Appears older than stated age Attitude/Demeanor/Rapport:  Unable to Assess Affect (typically observed):  Unable to Assess Orientation:  Oriented to Self Alcohol / Substance use:  Never Used Psych involvement (Current and /or in the community):  No (Comment)  Discharge Needs  Concerns to be addressed:  No discharge needs identified Readmission within the last 30 days:  Yes Current discharge risk:  None Barriers to Discharge:  No Barriers Identified   Derenda Fennel, MSW, LCSWA [phone] 11/14/2014 10:42 AM

## 2014-11-14 NOTE — Progress Notes (Signed)
Benedetto Coons, NP, was paged about Lactic acid of 2.8.

## 2014-11-14 NOTE — Progress Notes (Signed)
Hypoglycemic Event  CBG: 59  Treatment: D50 IV 25 mL  Symptoms: None  Follow-up CBG: Time: 0011 CBG Result: 138  Possible Reasons for Event: Unknown  Comments/MD notified: ELINK MD called and made aware    Joan [name], Joan [name]  Remember to initiate Hypoglycemia Order Set & complete

## 2014-11-14 NOTE — Procedures (Signed)
History: 54 year old female with altered mental status  Sedation: None  Technique: This is a 19 channel routine scalp EEG performed at the bedside with bipolar and monopolar montages arranged in accordance to the international 10/20 system of electrode placement. One channel was dedicated to EKG recording.    Background: The background consists of low voltage irregular delta and theta activity. The delta activity is generalized, irregular theta is predominant in the central regions. I never clearly visualize a posterior dominant rhythm though the background is obscured much the time with muscle artifact.   Photic stimulation: Physiologic driving is not performed  EEG Abnormalities: 1) generalized irregular slow activity  Clinical Interpretation: This EEG is consistent with a generalized non-specific cerebral dysfunction(encephalopathy). There was no seizure or seizure predisposition recorded on this study.   [name], MD [facility] [phone]  If 7pm- 7am, please page neurology on call as listed in AMION.

## 2014-11-14 NOTE — Progress Notes (Signed)
[place] TEAM 1 - Stepdown/ICU TEAM Progress Note  Joan [name] ONG:[id number] DOB: 1960-09-12 DOA: 11/12/2014 PCP: [name], MD  Admit HPI / Brief Narrative: 54yo BF SNF resident PMHx catatonia, progressive neurodegenerative decline of unclear diagnosis (followed by Dr.Pramod S Sethi neurologist) on chronic steroids, functional quadriplegia, seizures, HTN, nonverbal/contracted at baseline. Recent hospitalization for recurrent UTI [facility]) in setting chronic foley.   Presented 6/15 from SNF r/t abnormal lab results including hypernatremia and hypokalemia. In ER found to be hypotensive with lactate=9, CXR with PNA, U/a suspicious for UTI and PCCM called to admit.    HPI/Subjective: 6/17 A/O 0. Patient. Is Making Noises However Not Purposeful. I Spontaneously Open and Tearing. Muscle Tremors Apparent.  Assessment/Plan: Metabolic encephalopathy on chronic encephalitis unknown origin  -Patient on admission was hypernatremic with sodium 173, hypokalemic, bandemia and lactic acidosis -Patient with MCV> 100 obtain anemia panel: Low B-12/folate? -Dr. [name] (neuro hospitalist) was contacted secondary to belief that patient may be in status epilepticus. EEG performed negative  Seizures -Followed at [facility]  -Start clonazepam 0.25 mg BID -Start valproic acid 750 mg TID -See metabolic encephalopathy  Acute respiratory failure/HCAP  -Continue current antibiotics for 7 days -Titrate O2 to maintain SPO2> 93% -PCXR from 6/16 not helpful -Sputum culture if able -If concern that patient is not recovering appropriately chest CT would be more appropriate given patient's upper extremity contracture.  Severe sepsis -Multifactorial to include HCAP, UTI (funguria/bacteria) -Continue current antibiotics -Diflucan IV 200 mg 1, then 100 mg daily,  UTI (Funguria/strep viridans) -See severe sepsis  Relative adrenal insufficiency  -Continue Stress steroids Solu-Cortef  100 mg BID -cortisol 19.9  Severe hypernatremia  -Increase free water q 4hr   Hypokalemia  -Potassium 20 mmol 1 -Check potassium and magnesium in a.m.  Acute kidney failure  -Resolved   Hypothermia -Apply a bear hugger maintain patient's body temperature 37 C  Anemia chronic disease? -Obtain an anemia panel   Thrombocytopenia  -Was likely secondary to her infection. Currently no overt sign of bleeding  -Would avoid all heparin products  Severe protein calorie malnutrition  -After patient more stable will consider tube feeds   -PEG tube prior to discharge?     Code Status: FULL Family Communication: family present at time of exam Disposition Plan: Resolution sepsis/improvement in encephalopathy    Consultants: Dr.Wesam Santa Genera [facility] M) Dr. [name] (neuro hospitalist)   Procedure/Significant Events:    Culture 6/15 blood right arm/forearm NGTD 6/15 urine positive strep viridans/yeast  6/15 MRSA positive 6/15 urine pending   Antibiotics: Vancomycin 6/15>> Zosyn 6/15>>>  DVT prophylaxis: SCD   Devices    LINES / TUBES:  6/15 CVL RUA PICC    Continuous Infusions: . dextrose 5 % and 0.45% NaCl 50 mL/hr at 11/14/14 0000    Objective: VITAL SIGNS: Temp: 96.6 F (35.9 C) (06/17 1530) Temp Source: Axillary (06/17 1530) BP: 123/67 mmHg (06/17 1530) Pulse Rate: 86 (06/17 1530) SPO2; FIO2:   Intake/Output Summary (Last 24 hours) at 11/14/14 1910 Last data filed at 11/14/14 1800  Gross per 24 hour  Intake   2530 ml  Output    400 ml  Net   2130 ml     Exam: General: A/O 0 (catatonic), No acute respiratory distress Eyes: negative retinal hemorrhage ENT: Negative Runny nose negative gingival bleeding Neck:  Negative scars, masses, torticollis, lymphadenopathy, JVD Lungs: tachypnea, Clear to auscultation bilaterally without wheezes or crackles Cardiovascular: Tachycardic, Regular rhythm without murmur gallop or  rub  normal S1 and S2 Abdomen: Unable to assess  Extremities: No significant cyanosis. All extremities in varying state of contracture Psychiatric:  Unable to assess  Neurologic:  Unable to assess      Data Reviewed: Basic Metabolic Panel:  Recent Labs Lab 11/12/14 1614  11/13/14 0430 11/13/14 0918 11/13/14 1159 11/13/14 1700 11/14/14 0330  NA 164*  < > 168* 164* 165* 162* 157*  K 5.0  < > 3.1* 3.7 3.5 3.8 3.4*  CL >130*  < > 128* 125* 128* 128* 123*  CO2 19*  < > 27 29 29 29 30   GLUCOSE 113*  < > 157* 165* 176* 74 115*  BUN 24*  < > 33* 31* 31* 33* 32*  CREATININE 0.94  < > 1.21* 1.12* 1.13* 1.06* 1.09*  CALCIUM 5.3*  < > 7.8* 7.9* 8.0* 8.1* 8.1*  MG 1.5*  --   --   --   --   --  2.0  PHOS 1.8*  --   --   --   --   --  2.2*  < > = values in this interval not displayed. Liver Function Tests:  Recent Labs Lab 11/12/14 1205 11/12/14 1614  AST 77* 47*  ALT 32 19  ALKPHOS 82 39  BILITOT 1.3* 0.6  PROT 4.6* <3.0*  ALBUMIN 2.0* <1.0*    Recent Labs Lab 11/12/14 1614  LIPASE 11*  AMYLASE 47   No results for input(s): AMMONIA in the last 168 hours. CBC:  Recent Labs Lab 11/12/14 1205 11/12/14 1614 11/12/14 2120 11/13/14 0430 11/14/14 0330  WBC 7.0 4.5 9.1 6.4 5.8  NEUTROABS 5.4  --   --   --   --   HGB 12.3 7.1* 10.2* 8.8* 9.0*  HCT 40.1 23.4* 33.3* 28.7* 29.3*  MCV 123.8* 123.8* 123.3* 123.2* 122.6*  PLT 35* 18* 28* 22* 19*   Cardiac Enzymes:  Recent Labs Lab 11/12/14 1614  TROPONINI 0.51*   BNP (last 3 results) No results for input(s): BNP in the last 8760 hours.  ProBNP (last 3 results) No results for input(s): PROBNP in the last 8760 hours.  CBG:  Recent Labs Lab 11/14/14 0011 11/14/14 0309 11/14/14 0741 11/14/14 1141 11/14/14 1656  GLUCAP 138* 107* 119* 152* 142*    Recent Results (from the past 240 hour(s))  Culture, blood (routine x 2)     Status: None (Preliminary result)   Collection Time: 11/12/14 12:05 PM  Result Value  Ref Range Status   Specimen Description BLOOD RIGHT ARM  Final   Special Requests BOTTLES DRAWN AEROBIC AND ANAEROBIC 5CC  Final   Culture NO GROWTH 2 DAYS  Final   Report Status PENDING  Incomplete  Urine culture     Status: None   Collection Time: 11/12/14 12:24 PM  Result Value Ref Range Status   Specimen Description URINE, CLEAN CATCH  Final   Special Requests NONE  Final   Culture   Final    >100,000 CFU VIRIDANS STREPTOCOCCUS >100,000 CFU YEAST    Report Status 11/14/2014 FINAL  Final  Culture, blood (routine x 2)     Status: None (Preliminary result)   Collection Time: 11/12/14 12:35 PM  Result Value Ref Range Status   Specimen Description BLOOD RIGHT FOREARM  Final   Special Requests BOTTLES DRAWN AEROBIC ONLY 0.5CC  Final   Culture NO GROWTH 2 DAYS  Final   Report Status PENDING  Incomplete  MRSA PCR Screening     Status: Abnormal  Collection Time: 11/12/14  6:03 PM  Result Value Ref Range Status   MRSA by PCR POSITIVE (A) NEGATIVE Final    Comment:        The GeneXpert MRSA Assay (FDA approved for NASAL specimens only), is one component of a comprehensive MRSA colonization surveillance program. It is not intended to diagnose MRSA infection nor to guide or monitor treatment for MRSA infections. RESULT CALLED TO, READ BACK BY AND VERIFIED WITH: K.KELLY RN 2308 11/12/14 E.GADDY   Culture, Urine     Status: None (Preliminary result)   Collection Time: 11/12/14  9:22 PM  Result Value Ref Range Status   Specimen Description URINE, CATHETERIZED  Final   Special Requests NONE  Final   Culture TOO YOUNG TO READ  Final   Report Status PENDING  Incomplete     Studies:  Recent x-ray studies have been reviewed in detail by the Attending Physician  Scheduled Meds:  Scheduled Meds: . Chlorhexidine Gluconate Cloth  6 each Topical Q0600  . clonazePAM  0.25 mg Oral BID  . [START ON 11/15/2014] fluconazole (DIFLUCAN) IV  100 mg Intravenous Q24H  . fluconazole  (DIFLUCAN) IV  200 mg Intravenous Once  . free water  250 mL Per Tube Q4H  . hydrocortisone sod succinate (SOLU-CORTEF) inj  100 mg Intravenous Q12H  . insulin aspart  0-15 Units Subcutaneous 6 times per day  . mupirocin ointment  1 application Nasal BID  . piperacillin-tazobactam (ZOSYN)  IV  3.375 g Intravenous Q8H  . valproate sodium  750 mg Intravenous 3 times per day  . vancomycin  750 mg Intravenous Q24H    Time spent on care of this patient: 40 mins   WOODS, Roselind Messier , MD  [facility] Office  [phone] Pager [phone]  On-Call/Text Page:      [email or web address]      password [email or web address]  If 7PM-7AM, please contact night-coverage [email or web address] Password [email or web address] 11/14/2014, 7:10 PM   LOS: 2 days   Care during the described time interval was provided by me .  I have reviewed this patient's available data, including medical history, events of note, physical examination, and all test results as part of my evaluation. I have personally reviewed and interpreted all radiology studies.   Carolyne Littles, MD [phone] Pager

## 2014-11-14 NOTE — Consult Note (Addendum)
Neurology Consultation Reason for Consult: Altered Mental Status Referring Physician: Joseph Art, C  CC: Altered mental status  History is obtained from: Medical record, family   HPI: Joan [name] is a 54 y.o. female with a history of progressive neurological decline since 2014. With previous surgeries, she had had 2 episodes that were diagnosed as "catatonia" that resolved with Ativan. In December 2014, she was admitted to the hospital with what was suspected to be another catatonic state. Proximally 48 hours after admission, she began having tremors, jerks. Neurology was consulted and it was felt that this date was concerning for nonconvulsive status. She was given Ativan with resolution of abnormal movements.  By the time an EEG was performed a few hours later, she was just having generalized slow activity.  Lumbar puncture at that time did show pleocytosis, with elevated protein and essentially normal glucose. but no organism was ever identified. 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 CSF was sent for paraneoplastic evaluation to [facility] lab which were negative. As was herpes, enterovirus. Arboviruses were not sent, presumably because it was December. NMDA antibody was negative.  She continued to be encephalopathic and had multiple long-term monitoring EEGs with no clear seizure. She had plasma exchange performed and was given IV steroids with no improvement.  Since that time she has continued to have encephalopathy that appears to have waxed and waned.   She has been evaluated at [facility] with further workup with only positive finding anti-gad positivity(I'm not certain of the level) as well as positive ANA with speckled pattern.  She has continued to be verbal, but has developed spastic quadriparesis. She apparently has not been needing to feeding for a few months and is fed pures.  She is being treated with chronic steroids.  She was brought into the emergency room in late  May with UTI and discharged on June 3. She was readmitted on June 15 with HCAP.  ROS:  Unable to obtain due to altered mental status.   Past Medical History  Diagnosis Date  . Peripheral neuropathy   . Fibromyalgia   . Hypertension   . Anxiety   . Catatonia   . Seizures     Family History: Unable to obtain due to altered mental status.  Social History: Tob: Unable to obtain due to altered mental status.  Exam: Current vital signs: BP 123/67 mmHg  Pulse 86  Temp(Src) 96.6 F (35.9 C) (Axillary)  Resp 19  Ht 5\' 5"  (1.651 m)  Wt 63.2 kg (139 lb 5.3 oz)  BMI 23.19 kg/m2  SpO2 99% Vital signs in last 24 hours: Temp:  [96.3 F (35.7 C)-98.8 F (37.1 C)] 96.6 F (35.9 C) (06/17 1530) Pulse Rate:  [25-115] 86 (06/17 1530) Resp:  [14-22] 19 (06/17 1530) BP: (106-127)/(43-97) 123/67 mmHg (06/17 1530) SpO2:  [99 %-100 %] 99 % (06/17 1530) Weight:  [59 kg (130 lb 1.1 oz)-63.2 kg (139 lb 5.3 oz)] 63.2 kg (139 lb 5.3 oz) (06/17 1329)   Physical Exam  Constitutional: Appears chronically ill Psych: Does not interact Eyes: No scleral injection HENT: No OP obstrucion Head: Normocephalic.  Cardiovascular: Normal rate and regular rhythm.  Respiratory: Effort normal GI: Soft.  No distension. There is no tenderness.  Skin: WDI  Neuro: Mental Status: Patient opens eyes to voice, does not follow commands. She moans throughout my interview Cranial Nerves: II: Appears to blink to threat bilaterally L pupil much larger than right [name],IV, VI: She does appear to cross midline both  directions, appears to track. V:VII: Blinks to eyelid stimulation VIII, X, XI, XII: Unable to assess secondary to patient's altered mental status.  Motor: Tone is markedly increased with a severe spastic quadriparesis Sensory: She does grimace to noxious stimuli in all 4 extremities Cerebellar: Unable to obtain due to altered mental status.   I have reviewed labs in epic and the results pertinent to  this consultation are: Elevated sodium   I have reviewed the images obtained: CT head from 09/30/2014-significant atrophy  Impression: 54 year old female with chronic encephalopathy of unclear etiology. I suspect her acute worsening is due to her general medical condition superimposed on her underlying encephalopathic state. I would favor ruling out status epilepticus with a stat EEG, however this is negative then I would favor treating her conservatively given that she is artery had extensive workup performed at 2 institutions.  Recommendations: 1) EEG 2) restart home Depakote and clonazepam 3) neurology will continue to follow   [name], MD [facility] [phone]  If 7pm- 7am, please page neurology on call as listed in AMION.

## 2014-11-14 NOTE — Progress Notes (Signed)
Inpatient Diabetes Program Recommendations  AACE/ADA: New Consensus Statement on Inpatient Glycemic Control (2013)  Target Ranges:  Prepandial:   less than 140 mg/dL      Peak postprandial:   less than 180 mg/dL (1-2 hours)      Critically ill patients:  140 - 180 mg/dL   Results for Joan [name], Joan [name] (MRN [id number]) as of 11/14/2014 09:44  Ref. Range 11/13/2014 11:41 11/13/2014 16:04 11/13/2014 19:29 11/13/2014 19:45 11/13/2014 21:30 11/13/2014 23:34 11/13/2014 23:46 11/14/2014 00:11 11/14/2014 03:09 11/14/2014 07:41  Glucose-Capillary Latest Ref Range: 65-99 mg/dL 568 (H) 74 52 (L) 73 88 62 (L) 59 (L) 138 (H) 107 (H) 119 (H)   Current orders for Inpatient glycemic control: Novolog 0-15 units Q4H  Inpatient Diabetes Program Recommendations Correction (SSI): Please decrease Novolog correction to sensitive scale and consider changing scale to start correction once CBG is greater than 150 mg/dl.  Thanks, Orlando Penner, RN, MSN, CCRN, CDE Diabetes Coordinator Inpatient Diabetes Program [phone] (Team Pager from 8am to 5pm) [phone] ([facility] office) [phone] [facility] office) [phone] [facility] office)

## 2014-11-15 ENCOUNTER — Inpatient Hospital Stay (HOSPITAL_COMMUNITY): Payer: Medicare Other

## 2014-11-15 DIAGNOSIS — J9621 Acute and chronic respiratory failure with hypoxia: Secondary | ICD-10-CM

## 2014-11-15 DIAGNOSIS — L89159 Pressure ulcer of sacral region, unspecified stage: Secondary | ICD-10-CM | POA: Diagnosis present

## 2014-11-15 DIAGNOSIS — A419 Sepsis, unspecified organism: Secondary | ICD-10-CM | POA: Diagnosis present

## 2014-11-15 DIAGNOSIS — R6520 Severe sepsis without septic shock: Secondary | ICD-10-CM

## 2014-11-15 DIAGNOSIS — L89152 Pressure ulcer of sacral region, stage 2: Secondary | ICD-10-CM

## 2014-11-15 DIAGNOSIS — J9601 Acute respiratory failure with hypoxia: Secondary | ICD-10-CM

## 2014-11-15 DIAGNOSIS — N3000 Acute cystitis without hematuria: Secondary | ICD-10-CM | POA: Diagnosis present

## 2014-11-15 LAB — CBC WITH DIFFERENTIAL/PLATELET
BASOS ABS: 0 10*3/uL (ref 0.0–0.1)
Basophils Relative: 0 % (ref 0–1)
Eosinophils Absolute: 0 10*3/uL (ref 0.0–0.7)
Eosinophils Relative: 0 % (ref 0–5)
HCT: 25.8 % — ABNORMAL LOW (ref 36.0–46.0)
Hemoglobin: 8.3 g/dL — ABNORMAL LOW (ref 12.0–15.0)
LYMPHS ABS: 0.7 10*3/uL (ref 0.7–4.0)
Lymphocytes Relative: 12 % (ref 12–46)
MCH: 37.7 pg — ABNORMAL HIGH (ref 26.0–34.0)
MCHC: 32.2 g/dL (ref 30.0–36.0)
MCV: 117.3 fL — ABNORMAL HIGH (ref 78.0–100.0)
Monocytes Absolute: 0.3 10*3/uL (ref 0.1–1.0)
Monocytes Relative: 6 % (ref 3–12)
Neutro Abs: 4.7 10*3/uL (ref 1.7–7.7)
Neutrophils Relative %: 82 % — ABNORMAL HIGH (ref 43–77)
PLATELETS: 13 10*3/uL — AB (ref 150–400)
RBC: 2.2 MIL/uL — ABNORMAL LOW (ref 3.87–5.11)
RDW: 17.8 % — AB (ref 11.5–15.5)
WBC Morphology: INCREASED
WBC: 5.7 10*3/uL (ref 4.0–10.5)

## 2014-11-15 LAB — URINE CULTURE

## 2014-11-15 LAB — COMPREHENSIVE METABOLIC PANEL
ALT: 33 U/L (ref 14–54)
ANION GAP: 8 (ref 5–15)
AST: 55 U/L — ABNORMAL HIGH (ref 15–41)
Albumin: 1.3 g/dL — ABNORMAL LOW (ref 3.5–5.0)
Alkaline Phosphatase: 77 U/L (ref 38–126)
BUN: 25 mg/dL — ABNORMAL HIGH (ref 6–20)
CALCIUM: 7.5 mg/dL — AB (ref 8.9–10.3)
CO2: 26 mmol/L (ref 22–32)
CREATININE: 1.01 mg/dL — AB (ref 0.44–1.00)
Chloride: 113 mmol/L — ABNORMAL HIGH (ref 101–111)
GFR calc Af Amer: >60 mL/min (ref >60)
GLUCOSE: 114 mg/dL — AB (ref 65–99)
Potassium: 2.8 mmol/L — ABNORMAL LOW (ref 3.5–5.1)
Sodium: 147 mmol/L — ABNORMAL HIGH (ref 135–145)
Total Bilirubin: 0.9 mg/dL (ref 0.3–1.2)
Total Protein: 3.6 g/dL — ABNORMAL LOW (ref 6.5–8.1)

## 2014-11-15 LAB — LACTIC ACID, PLASMA: LACTIC ACID, VENOUS: 2.3 mmol/L — AB (ref 0.5–2.0)

## 2014-11-15 LAB — GLUCOSE, CAPILLARY
GLUCOSE-CAPILLARY: 151 mg/dL — AB (ref 65–99)
GLUCOSE-CAPILLARY: 155 mg/dL — AB (ref 65–99)
Glucose-Capillary: 105 mg/dL — ABNORMAL HIGH (ref 65–99)
Glucose-Capillary: 111 mg/dL — ABNORMAL HIGH (ref 65–99)
Glucose-Capillary: 120 mg/dL — ABNORMAL HIGH (ref 65–99)
Glucose-Capillary: 99 mg/dL (ref 65–99)

## 2014-11-15 LAB — MAGNESIUM
MAGNESIUM: 1.7 mg/dL (ref 1.7–2.4)
Magnesium: 2.2 mg/dL (ref 1.7–2.4)

## 2014-11-15 LAB — TROPONIN I: Troponin I: 0.15 ng/mL — ABNORMAL HIGH (ref <0.031)

## 2014-11-15 LAB — POTASSIUM: POTASSIUM: 3.5 mmol/L (ref 3.5–5.1)

## 2014-11-15 LAB — OCCULT BLOOD X 1 CARD TO LAB, STOOL: FECAL OCCULT BLD: NEGATIVE

## 2014-11-15 MED ORDER — CHLORHEXIDINE GLUCONATE 0.12 % MT SOLN
15.0000 mL | Freq: Two times a day (BID) | OROMUCOSAL | Status: DC
  Administered 2014-11-15 – 2014-11-19 (×9): 15 mL via OROMUCOSAL
  Filled 2014-11-15 (×11): qty 15

## 2014-11-15 MED ORDER — CETYLPYRIDINIUM CHLORIDE 0.05 % MT LIQD
7.0000 mL | Freq: Two times a day (BID) | OROMUCOSAL | Status: DC
  Administered 2014-11-15 – 2014-11-19 (×9): 7 mL via OROMUCOSAL

## 2014-11-15 MED ORDER — POTASSIUM CHLORIDE 10 MEQ/100ML IV SOLN
10.0000 meq | INTRAVENOUS | Status: AC
  Administered 2014-11-15 (×5): 10 meq via INTRAVENOUS
  Filled 2014-11-15 (×5): qty 100

## 2014-11-15 MED ORDER — MAGNESIUM SULFATE 2 GM/50ML IV SOLN
2.0000 g | Freq: Once | INTRAVENOUS | Status: AC
  Administered 2014-11-15: 2 g via INTRAVENOUS
  Filled 2014-11-15 (×2): qty 50

## 2014-11-15 NOTE — Progress Notes (Signed)
ANTIBIOTIC CONSULT NOTE - Follow up  Pharmacy Consult:  Vancomycin / Zosyn Indication:  HCAP  Allergies  Allergen Reactions  . Citalopram Nausea And Vomiting  . Clindamycin Rash and Other (See Comments)    GI upset  . Duloxetine Hcl Other (See Comments)    GI upset   . Escitalopram Nausea And Vomiting  . Hydromorphone Other (See Comments)    Generalized severe pain  . Morphine Other (See Comments)    Generalized severe pain  . Paroxetine Other (See Comments)    GI upset  . Sertraline Other (See Comments)    GI upset  . Sulfamethoxazole-Trimethoprim Nausea And Vomiting  . Tape Itching, Dermatitis and Rash    Paper tape only.  . Clarithromycin Rash  . Doxycycline Rash and Other (See Comments)    GI upset  . Latex Itching and Rash  . Metoclopramide Anxiety  . Morphine And Related Other (See Comments)    REACTION: Causes pain  . Paroxetine Hcl Other (See Comments)    REACTION:  unknown    Patient Measurements: Height:  (165.1 cm) Weight: 142 lb 3.2 oz (64.5 kg) IBW/kg (Calculated) : 57  Vital Signs: Temp: 97.8 F (36.6 C) (06/18 0753) Temp Source: Axillary (06/18 0753) BP: 101/69 mmHg (06/18 0753) Pulse Rate: 89 (06/18 0753)  Labs:  Recent Labs  11/13/14 0430  11/13/14 1700 11/14/14 0330 11/15/14 0600  WBC 6.4  --   --  5.8 5.7  HGB 8.8*  --   --  9.0* 8.3*  PLT 22*  --   --  19* 13*  CREATININE 1.21*  < > 1.06* 1.09* 1.01*  < > = values in this interval not displayed. Estimated Creatinine Clearance: 58 mL/min (by C-G formula based on Cr of 1.01). No results for input(s): VANCOTROUGH, VANCOPEAK, VANCORANDOM, GENTTROUGH, GENTPEAK, GENTRANDOM, TOBRATROUGH, TOBRAPEAK, TOBRARND, AMIKACINPEAK, AMIKACINTROU, AMIKACIN in the last 72 hours.   Microbiology: Recent Results (from the past 720 hour(s))  Urine culture     Status: None   Collection Time: 10/23/14  8:39 AM  Result Value Ref Range Status   Specimen Description URINE, CLEAN CATCH  Final   Special  Requests NONE  Final   Colony Count   Final    >=100,000 COLONIES/ML Performed at [facility]    Culture   Final    ESCHERICHIA COLI Performed at [facility]    Report Status 10/25/2014 FINAL  Final   Organism ID, Bacteria ESCHERICHIA COLI  Final      Susceptibility   Escherichia coli - MIC*    AMPICILLIN >=32 RESISTANT Resistant     CEFAZOLIN <=4 SENSITIVE Sensitive     CEFTRIAXONE <=1 SENSITIVE Sensitive     CIPROFLOXACIN >=4 RESISTANT Resistant     GENTAMICIN <=1 SENSITIVE Sensitive     LEVOFLOXACIN >=8 RESISTANT Resistant     NITROFURANTOIN <=16 SENSITIVE Sensitive     TOBRAMYCIN <=1 SENSITIVE Sensitive     TRIMETH/SULFA <=20 SENSITIVE Sensitive     PIP/TAZO 8 SENSITIVE Sensitive     * ESCHERICHIA COLI  Clostridium Difficile by PCR     Status: None   Collection Time: 10/26/14 11:02 AM  Result Value Ref Range Status   C difficile by pcr NEGATIVE NEGATIVE Final  Clostridium Difficile by PCR     Status: None   Collection Time: 10/29/14  1:54 PM  Result Value Ref Range Status   C difficile by pcr NEGATIVE NEGATIVE Final  Culture, blood (routine x 2)  Status: None (Preliminary result)   Collection Time: 11/12/14 12:05 PM  Result Value Ref Range Status   Specimen Description BLOOD RIGHT ARM  Final   Special Requests BOTTLES DRAWN AEROBIC AND ANAEROBIC 5CC  Final   Culture NO GROWTH 2 DAYS  Final   Report Status PENDING  Incomplete  Urine culture     Status: None   Collection Time: 11/12/14 12:24 PM  Result Value Ref Range Status   Specimen Description URINE, CLEAN CATCH  Final   Special Requests NONE  Final   Culture   Final    >100,000 CFU VIRIDANS STREPTOCOCCUS >100,000 CFU YEAST    Report Status 11/14/2014 FINAL  Final  Culture, blood (routine x 2)     Status: None (Preliminary result)   Collection Time: 11/12/14 12:35 PM  Result Value Ref Range Status   Specimen Description BLOOD RIGHT FOREARM  Final   Special Requests BOTTLES DRAWN AEROBIC  ONLY 0.5CC  Final   Culture NO GROWTH 2 DAYS  Final   Report Status PENDING  Incomplete  MRSA PCR Screening     Status: Abnormal   Collection Time: 11/12/14  6:03 PM  Result Value Ref Range Status   MRSA by PCR POSITIVE (A) NEGATIVE Final    Comment:        The GeneXpert MRSA Assay (FDA approved for NASAL specimens only), is one component of a comprehensive MRSA colonization surveillance program. It is not intended to diagnose MRSA infection nor to guide or monitor treatment for MRSA infections. RESULT CALLED TO, READ BACK BY AND VERIFIED WITH: K.KELLY RN 2308 11/12/14 E.GADDY   Culture, Urine     Status: None (Preliminary result)   Collection Time: 11/12/14  9:22 PM  Result Value Ref Range Status   Specimen Description URINE, CATHETERIZED  Final   Special Requests NONE  Final   Culture TOO YOUNG TO READ  Final   Report Status PENDING  Incomplete    Medical History: Past Medical History  Diagnosis Date  . Peripheral neuropathy   . Fibromyalgia   . Hypertension   . Anxiety   . Catatonia   . Seizures       Assessment: 27 YOF from [facility] presented with AMS and abnormal lab work.  Pharmacy consulted to initiate vancomycin and Zosyn for PNA on CXR.  Noted PCN allergy documentation, but patient received multiple doses of Zosyn in January of 2015.  Afebrile, WBC 5.7, SCr 1.01 stable.   Goal of Therapy:  Vancomycin trough level 15-20 mcg/ml   Plan:  - Continue vanc 750mg  IV Q24H - Continue Zosyn 3.375gm IV Q8H (4 hr infusion) - Monitor renal fxn, clinical progress, vanc trough as indicated   Megan E. Supple, Pharm.D Clinical Pharmacy Resident Pager: [phone] 11/15/2014 8:39 AM

## 2014-11-15 NOTE — Progress Notes (Signed)
Joan [name] TEAM 1 - Stepdown/ICU TEAM Progress Note  Joan [name] XBJ:[id number] DOB: Jan 29, 1961 DOA: 11/12/2014 PCP: [name], MD  Admit HPI / Brief Narrative: 54yo BF SNF resident PMHx catatonia, progressive neurodegenerative decline of unclear diagnosis (followed by Dr.Pramod S Sethi neurologist) on chronic steroids, functional quadriplegia, seizures, HTN, nonverbal/contracted at baseline. Recent hospitalization for recurrent UTI [name]) in setting chronic foley.   Presented 6/15 from SNF r/t abnormal lab results including hypernatremia and hypokalemia. In ER found to be hypotensive with lactate=9, CXR with PNA, U/a suspicious for UTI and PCCM called to admit.    HPI/Subjective: 6/18 A/O 0. Patient. Is Making Noises However Not Purposeful. I Spontaneously Open eyes with tearing, Muscle Tremors Apparent.  Assessment/Plan: Metabolic encephalopathy on chronic encephalitis unknown origin?  -Patient on admission was hypernatremic with sodium 173, hypokalemic, bandemia and lactic acidosis -Patient with MCV> 100 obtain anemia panel: Low B-12/folate? -Dr. [name] (neuro hospitalist) was contacted secondary to belief that patient may be in status epilepticus. EEG performed negative -Patient still with negative improvement continue to correct electrolytes  Seizures -Followed at [facility]  -Start clonazepam 0.25 mg BID -Start valproic acid 750 mg TID -See metabolic encephalopathy  Acute respiratory failure/HCAP  -Continue current antibiotics for 7 days -Titrate O2 to maintain SPO2> 93% -PCXR from 6/16 not helpful -Sputum culture if able -If concern that patient is not recovering appropriately chest CT would be more appropriate given patient's upper extremity contracture.  Severe sepsis -Multifactorial to include HCAP, UTI (funguria/bacteria) -Continue current antibiotics -Diflucan IV 200 mg 1, then 100 mg daily,  UTI (Funguria/strep viridans) -See severe  sepsis  Relative adrenal insufficiency  -Continue Stress steroids Solu-Cortef 100 mg BID -cortisol 19.9  Severe hypernatremia  -Increase free water q 4hr  -Continue D5W-0.45% saline at 157ml/hr  Hypokalemia  -Potassium IV 10 meq x 5 runs -Check potassium and magnesium 1500  Hypomagnesemia -Magnesium IV 2 gm  Acute kidney failure  -Resolved   Hypothermia -Apply a bear hugger maintain patient's body temperature 37 C  Anemia chronic disease? -Obtain an anemia panel   Thrombocytopenia  -Most likely secondary to her infection. Currently no overt sign of bleeding  -Would avoid all heparin products -Patient's platelets continue to decline. May require transfusion and hematology consult.  Severe protein calorie malnutrition  -After patient more stable will consider tube feeds   -PEG tube prior to discharge?   Sacral decubitus ulcer -Patient would stage II sacral decubitus ulcer and multiple other lacerations/ulcers on bilateral buttocks stage I. -We'll place flexi seal to ensure area stays clean -Will obtain KinAir bed and ensure on rotation at all times.   Code Status: FULL Family Communication: No family present at time of exam Disposition Plan: Resolution sepsis/improvement in encephalopathy    Consultants: Dr.Wesam [facility]) Dr. [name] (neuro hospitalist)   Procedure/Significant Events:    Culture 6/15 blood right arm/forearm NGTD 6/15 urine positive strep viridans/yeast  6/15 MRSA positive 6/15 urine pending   Antibiotics: Vancomycin 6/15>> Zosyn 6/15>>>  DVT prophylaxis: SCD   Devices    LINES / TUBES:  6/15 CVL RUA PICC    Continuous Infusions: . dextrose 5 % and 0.45% NaCl 125 mL/hr at 11/15/14 0819    Objective: VITAL SIGNS: Temp: 97.3 F (36.3 C) (06/18 1205) Temp Source: Axillary (06/18 1205) BP: 101/69 mmHg (06/18 0753) Pulse Rate: 85 (06/18 1205) SPO2; FIO2:   Intake/Output Summary  (Last 24 hours) at 11/15/14 1210 Last data filed at 11/15/14 1038  Gross per 24 hour  Intake   3290 ml  Output    275 ml  Net   3015 ml     Exam: General: A/O 0 (catatonic), No acute respiratory distress Eyes: negative retinal hemorrhage ENT: Negative Runny nose negative gingival bleeding Neck:  Negative scars, masses, torticollis, lymphadenopathy, JVD Lungs: tachypnea, Clear to auscultation bilaterally without wheezes or crackles Cardiovascular: Tachycardic, Regular rhythm without murmur gallop or rub normal S1 and S2 Abdomen: Unable to assess  Extremities: No significant cyanosis. All extremities in varying state of contracture Psychiatric:  Unable to assess  Neurologic:  Unable to assess      Data Reviewed: Basic Metabolic Panel:  Recent Labs Lab 11/12/14 1614  11/13/14 0918 11/13/14 1159 11/13/14 1700 11/14/14 0330 11/15/14 0600  NA 164*  < > 164* 165* 162* 157* 147*  K 5.0  < > 3.7 3.5 3.8 3.4* 2.8*  CL >130*  < > 125* 128* 128* 123* 113*  CO2 19*  < > 29 29 29 30 26   GLUCOSE 113*  < > 165* 176* 74 115* 114*  BUN 24*  < > 31* 31* 33* 32* 25*  CREATININE 0.94  < > 1.12* 1.13* 1.06* 1.09* 1.01*  CALCIUM 5.3*  < > 7.9* 8.0* 8.1* 8.1* 7.5*  MG 1.5*  --   --   --   --  2.0 1.7  PHOS 1.8*  --   --   --   --  2.2*  --   < > = values in this interval not displayed. Liver Function Tests:  Recent Labs Lab 11/12/14 1205 11/12/14 1614 11/15/14 0600  AST 77* 47* 55*  ALT 32 19 33  ALKPHOS 82 39 77  BILITOT 1.3* 0.6 0.9  PROT 4.6* <3.0* 3.6*  ALBUMIN 2.0* <1.0* 1.3*    Recent Labs Lab 11/12/14 1614  LIPASE 11*  AMYLASE 47   No results for input(s): AMMONIA in the last 168 hours. CBC:  Recent Labs Lab 11/12/14 1205 11/12/14 1614 11/12/14 2120 11/13/14 0430 11/14/14 0330 11/15/14 0600  WBC 7.0 4.5 9.1 6.4 5.8 5.7  NEUTROABS 5.4  --   --   --   --  4.7  HGB 12.3 7.1* 10.2* 8.8* 9.0* 8.3*  HCT 40.1 23.4* 33.3* 28.7* 29.3* 25.8*  MCV 123.8* 123.8*  123.3* 123.2* 122.6* 117.3*  PLT 35* 18* 28* 22* 19* 13*   Cardiac Enzymes:  Recent Labs Lab 11/12/14 1614  TROPONINI 0.51*   BNP (last 3 results) No results for input(s): BNP in the last 8760 hours.  ProBNP (last 3 results) No results for input(s): PROBNP in the last 8760 hours.  CBG:  Recent Labs Lab 11/14/14 1656 11/14/14 1946 11/15/14 0029 11/15/14 0431 11/15/14 0752  GLUCAP 142* 105* 99 111* 105*    Recent Results (from the past 240 hour(s))  Culture, blood (routine x 2)     Status: None (Preliminary result)   Collection Time: 11/12/14 12:05 PM  Result Value Ref Range Status   Specimen Description BLOOD RIGHT ARM  Final   Special Requests BOTTLES DRAWN AEROBIC AND ANAEROBIC 5CC  Final   Culture NO GROWTH 2 DAYS  Final   Report Status PENDING  Incomplete  Urine culture     Status: None   Collection Time: 11/12/14 12:24 PM  Result Value Ref Range Status   Specimen Description URINE, CLEAN CATCH  Final   Special Requests NONE  Final   Culture   Final    >=100,000 COLONIES/mL VIRIDANS  STREPTOCOCCUS >=100,000 COLONIES/mL YEAST    Report Status 11/15/2014 FINAL  Final  Culture, blood (routine x 2)     Status: None (Preliminary result)   Collection Time: 11/12/14 12:35 PM  Result Value Ref Range Status   Specimen Description BLOOD RIGHT FOREARM  Final   Special Requests BOTTLES DRAWN AEROBIC ONLY 0.5CC  Final   Culture NO GROWTH 2 DAYS  Final   Report Status PENDING  Incomplete  MRSA PCR Screening     Status: Abnormal   Collection Time: 11/12/14  6:03 PM  Result Value Ref Range Status   MRSA by PCR POSITIVE (A) NEGATIVE Final    Comment:        The GeneXpert MRSA Assay (FDA approved for NASAL specimens only), is one component of a comprehensive MRSA colonization surveillance program. It is not intended to diagnose MRSA infection nor to guide or monitor treatment for MRSA infections. RESULT CALLED TO, READ BACK BY AND VERIFIED WITH: K.KELLY RN 2308  11/12/14 E.GADDY   Culture, Urine     Status: None (Preliminary result)   Collection Time: 11/12/14  9:22 PM  Result Value Ref Range Status   Specimen Description URINE, CATHETERIZED  Final   Special Requests NONE  Final   Culture >=100,000 COLONIES/mL YEAST  Final   Report Status PENDING  Incomplete     Studies:  Recent x-ray studies have been reviewed in detail by the Attending Physician  Scheduled Meds:  Scheduled Meds: . antiseptic oral rinse  7 mL Mouth Rinse q12n4p  . chlorhexidine  15 mL Mouth Rinse BID  . Chlorhexidine Gluconate Cloth  6 each Topical Q0600  . clonazePAM  0.25 mg Oral BID  . fluconazole (DIFLUCAN) IV  100 mg Intravenous Q24H  . free water  250 mL Per Tube Q4H  . hydrocortisone sod succinate (SOLU-CORTEF) inj  100 mg Intravenous Q12H  . insulin aspart  0-15 Units Subcutaneous 6 times per day  . mupirocin ointment  1 application Nasal BID  . piperacillin-tazobactam (ZOSYN)  IV  3.375 g Intravenous Q8H  . potassium chloride  10 mEq Intravenous Q1 Hr x 5  . valproate sodium  750 mg Intravenous 3 times per day  . vancomycin  750 mg Intravenous Q24H    Time spent on care of this patient: 40 mins   WOODS, Roselind Messier , MD  [facility] Office  [phone] Pager [phone]  On-Call/Text Page:      [email or web address]      password [email or web address]  If 7PM-7AM, please contact night-coverage [email or web address] Password [email or web address] 11/15/2014, 12:10 PM   LOS: 3 days   Care during the described time interval was provided by me .  I have reviewed this patient's available data, including medical history, events of note, physical examination, and all test results as part of my evaluation. I have personally reviewed and interpreted all radiology studies.   Carolyne Littles, MD [phone] Pager

## 2014-11-15 NOTE — Progress Notes (Signed)
RN notified Dr. Joseph Art that the pt K+ is 2.8

## 2014-11-15 NOTE — Progress Notes (Signed)
Nutrition Brief Note Was Consulted for Assessment of Status Pt seen yesterday Afternoon (6/17) . No Malnutrition Found  Recommended intervention: initiating nutrition support within 24 - 48 hr - TF via NGT with Osmolite 1.2 at 25 ml/h on day 1; on day 2, increase to goal rate of 55 ml/h (1320 ml per day)   Provides 1584 kcals, 73 gm protein, 1082 ml free water daily  See full note for other details.  Wt Readings from Last 15 Encounters:  11/15/14 142 lb 3.2 oz (64.5 kg)  10/24/14 123 lb 7.3 oz (56 kg)  10/03/14 129 lb 3 oz (58.6 kg)  12/04/13 164 lb 10.9 oz (74.7 kg)  08/27/13 153 lb 11.2 oz (69.718 kg)  07/30/13 181 lb 14.1 oz (82.5 kg)  07/24/13 170 lb 11.2 oz (77.429 kg)  07/16/13 182 lb 12.2 oz (82.9 kg)  05/17/13 154 lb 12.2 oz (70.2 kg)    Body mass index is 23.66 kg/(m^2). Patient meets criteria for Regular BMI based on current BMI.   Current diet order is NPO. Labs and medications reviewed.   No further nutrition interventions warranted at this time. If nutrition issues arise, please consult RD.   Christophe Louis RD, LDN Nutrition Pager: [phone] 11/15/2014 9:05 AM

## 2014-11-15 NOTE — Progress Notes (Signed)
  Echocardiogram 2D Echocardiogram has been performed.  Delcie Roch 11/15/2014, 5:47 PM

## 2014-11-15 NOTE — Progress Notes (Signed)
No seizure on EEG yesterday. She continues to be encephalopathic which I think is multifactorial due to multiple metabolic issues superimposed on her chronic encephalopathy of unclear etiology. She follows with an epilepsy doctor at [facility].   She continues to have markedly increased tone, does not follow commands, may oritent eyes to voice to some degree. L Pupil larger than right.   I would not pursue any further workup at this time. Please call with any further questinos or concerns.   [name], MD [facility] [phone]  If 7pm- 7am, please page neurology on call as listed in AMION.

## 2014-11-16 DIAGNOSIS — R7989 Other specified abnormal findings of blood chemistry: Secondary | ICD-10-CM

## 2014-11-16 DIAGNOSIS — R778 Other specified abnormalities of plasma proteins: Secondary | ICD-10-CM | POA: Diagnosis present

## 2014-11-16 LAB — CBC WITH DIFFERENTIAL/PLATELET
Basophils Absolute: 0 10*3/uL (ref 0.0–0.1)
Basophils Relative: 0 % (ref 0–1)
Eosinophils Absolute: 0 10*3/uL (ref 0.0–0.7)
Eosinophils Relative: 0 % (ref 0–5)
HCT: 22.6 % — ABNORMAL LOW (ref 36.0–46.0)
Hemoglobin: 7.2 g/dL — ABNORMAL LOW (ref 12.0–15.0)
LYMPHS ABS: 0.7 10*3/uL (ref 0.7–4.0)
Lymphocytes Relative: 15 % (ref 12–46)
MCH: 37.1 pg — ABNORMAL HIGH (ref 26.0–34.0)
MCHC: 31.9 g/dL (ref 30.0–36.0)
MCV: 116.5 fL — AB (ref 78.0–100.0)
Monocytes Absolute: 0.1 10*3/uL (ref 0.1–1.0)
Monocytes Relative: 3 % (ref 3–12)
NEUTROS ABS: 3.6 10*3/uL (ref 1.7–7.7)
NEUTROS PCT: 82 % — AB (ref 43–77)
Platelets: 11 10*3/uL — CL (ref 150–400)
RBC: 1.94 MIL/uL — AB (ref 3.87–5.11)
RDW: 17.8 % — ABNORMAL HIGH (ref 11.5–15.5)
WBC MORPHOLOGY: INCREASED
WBC: 4.4 10*3/uL (ref 4.0–10.5)

## 2014-11-16 LAB — COMPREHENSIVE METABOLIC PANEL
ALBUMIN: 1.1 g/dL — AB (ref 3.5–5.0)
ALT: 27 U/L (ref 14–54)
AST: 40 U/L (ref 15–41)
Alkaline Phosphatase: 66 U/L (ref 38–126)
Anion gap: 5 (ref 5–15)
BILIRUBIN TOTAL: 0.7 mg/dL (ref 0.3–1.2)
BUN: 14 mg/dL (ref 6–20)
CALCIUM: 7.2 mg/dL — AB (ref 8.9–10.3)
CO2: 23 mmol/L (ref 22–32)
CREATININE: 0.84 mg/dL (ref 0.44–1.00)
Chloride: 117 mmol/L — ABNORMAL HIGH (ref 101–111)
GFR calc Af Amer: >60 mL/min (ref >60)
GFR calc non Af Amer: >60 mL/min (ref >60)
GLUCOSE: 340 mg/dL — AB (ref 65–99)
Potassium: 3.1 mmol/L — ABNORMAL LOW (ref 3.5–5.1)
Sodium: 145 mmol/L (ref 135–145)
Total Protein: 3.3 g/dL — ABNORMAL LOW (ref 6.5–8.1)

## 2014-11-16 LAB — GLUCOSE, CAPILLARY
GLUCOSE-CAPILLARY: 166 mg/dL — AB (ref 65–99)
GLUCOSE-CAPILLARY: 124 mg/dL — AB (ref 65–99)
GLUCOSE-CAPILLARY: 97 mg/dL (ref 65–99)
Glucose-Capillary: 121 mg/dL — ABNORMAL HIGH (ref 65–99)
Glucose-Capillary: 156 mg/dL — ABNORMAL HIGH (ref 65–99)
Glucose-Capillary: 99 mg/dL (ref 65–99)

## 2014-11-16 LAB — RAPID HIV SCREEN (HIV 1/2 AB+AG)
HIV 1/2 Antibodies: NONREACTIVE
HIV-1 P24 Antigen - HIV24: NONREACTIVE

## 2014-11-16 LAB — MAGNESIUM: Magnesium: 1.9 mg/dL (ref 1.7–2.4)

## 2014-11-16 LAB — TROPONIN I: Troponin I: 0.14 ng/mL — ABNORMAL HIGH (ref <0.031)

## 2014-11-16 LAB — SAVE SMEAR

## 2014-11-16 LAB — PREPARE RBC (CROSSMATCH)

## 2014-11-16 MED ORDER — SODIUM CHLORIDE 0.9 % IV SOLN
Freq: Once | INTRAVENOUS | Status: AC
  Administered 2014-11-16: 08:00:00 via INTRAVENOUS

## 2014-11-16 MED ORDER — SODIUM CHLORIDE 0.9 % IV SOLN
Freq: Once | INTRAVENOUS | Status: AC
  Administered 2014-11-16: 12:00:00 via INTRAVENOUS

## 2014-11-16 MED ORDER — POTASSIUM CHLORIDE 10 MEQ/100ML IV SOLN
10.0000 meq | INTRAVENOUS | Status: AC
  Administered 2014-11-16 (×4): 10 meq via INTRAVENOUS
  Filled 2014-11-16 (×4): qty 100

## 2014-11-16 NOTE — Progress Notes (Signed)
[place] TEAM 1 - Stepdown/ICU TEAM Progress Note  Novali Vollman ZOX:[id number] DOB: 10-03-1960 DOA: 11/12/2014 PCP: [name], MD  Admit HPI / Brief Narrative: 54yo BF SNF resident PMHx catatonia, progressive neurodegenerative decline of unclear diagnosis (followed by Dr.Pramod S Sethi neurologist) on chronic steroids, functional quadriplegia, seizures, HTN, nonverbal/contracted at baseline. Recent hospitalization for recurrent UTI [facility]) in setting chronic foley.   Presented 6/15 from SNF r/t abnormal lab results including hypernatremia and hypokalemia. In ER found to be hypotensive with lactate=9, CXR with PNA, U/a suspicious for UTI and PCCM called to admit.    HPI/Subjective: 6/19 A/O 0. Patient. Is Making Noises However Not Purposeful. I Spontaneously Open eyes with tearing, Muscle Tremors Apparent. Grimaces to painful stimuli, does not follow commands  Assessment/Plan: Metabolic encephalopathy on chronic encephalitis unknown origin?  -Patient on admission was hypernatremic with sodium 173, hypokalemic, bandemia and lactic acidosis -Patient with MCV> 100 obtain anemia panel: Low B-12/folate? -Dr. [name] (neuro hospitalist) was contacted secondary to belief that patient may be in status epilepticus. EEG performed negative -Patient still with negative improvement continue to correct electrolytes  Seizures -Followed at [facility]  -Start clonazepam 0.25 mg BID -Start valproic acid 750 mg TID -See metabolic encephalopathy  Acute respiratory failure/HCAP  -Continue current antibiotics for 7 days -Titrate O2 to maintain SPO2> 93% -PCXR from 6/16 not helpful -Sputum culture if able -If concern that patient is not recovering appropriately chest CT would be more appropriate given patient's upper extremity contracture.  Severe sepsis -Multifactorial to include HCAP, UTI (funguria/bacteria) -Continue current antibiotics -Diflucan IV 200 mg 1, then 100 mg  daily,  UTI (Funguria/strep viridans) -See severe sepsis  Elevated troponin -Most likely secondary to demand ischemia, monitor closely  Relative adrenal insufficiency  -Continue Stress steroids Solu-Cortef 100 mg BID -cortisol 19.9  Severe hypernatremia  -Improving with hydration -Increase free water q 4hr  -Continue D5W-0.45% saline at 146ml/hr  Hypokalemia  -Potassium IV 10 meq x 4 runs  Hypomagnesemia -Within normal limits; monitor closely   Acute kidney failure  -Resolved   Hypothermia -Apply a bear hugger maintain patient's body temperature 37 C  Anemia chronic disease? -Obtain an anemia panel -Transfuse for hemoglobin<7 -6/19 transfuse 1 unit PRBC   Thrombocytopenia  -Most likely secondary to her infection. Currently no overt sign of bleeding  -Would avoid all heparin products -Patient's platelets continue to decline to my: 6/19 transfuse 1 unit platelets. Patient has multiple lacerations which are oozing blood. -Most likely secondary to patient's ongoing infection, however will obtain peripheral blood smear (HUS/TTP? Less likely), HIV, hepatitis panel; B 12 high, folate normal    Severe protein calorie malnutrition  -After patient more stable will consider tube feeds   -PEG tube prior to discharge?   Sacral decubitus ulcer/skin breakdown -Patient would stage II sacral decubitus ulcer and multiple other lacerations/ulcers on bilateral buttocks stage I. -Stage I skin breakdown bilateral upper extremity antecubital spaces -WIll place flexi seal to ensure area stays clean -Will obtain KinAir bed and ensure on rotation at all times. -Obtain vitamin C, zinc, selenium levels    Code Status: FULL Family Communication: No family present at time of exam Disposition Plan: Resolution sepsis/improvement in encephalopathy    Consultants: Dr.Wesam [facility]) Dr. [name] (neuro hospitalist)   Procedure/Significant Events:  6/18  echocardiogram;- LVEF=55% to 60%. - (grade 1 diastolic dysfunction). - Tricuspid valve: Moderate regurgitation. 6/19 transfuse 1 unit PRBC 6/19 transfuse 1 unit platelets   Culture 6/15 blood  right arm/forearm NGTD 6/15 urine positive strep viridans/yeast  6/15 MRSA positive 6/15 urine pending   Antibiotics: Vancomycin 6/15>> Zosyn 6/15>>>  DVT prophylaxis: SCD   Devices    LINES / TUBES:  6/15 CVL RUA PICC    Continuous Infusions: . dextrose 5 % and 0.45% NaCl 125 mL/hr at 11/16/14 0616    Objective: VITAL SIGNS: Temp: 97.2 F (36.2 C) (06/19 0807) Temp Source: Axillary (06/19 0807) BP: 115/88 mmHg (06/19 0807) Pulse Rate: 80 (06/19 0807) SPO2; FIO2:   Intake/Output Summary (Last 24 hours) at 11/16/14 1106 Last data filed at 11/16/14 1610  Gross per 24 hour  Intake 3257.5 ml  Output    950 ml  Net 2307.5 ml     Exam: General: A/O 0 (catatonic), eyes spontaneously open, does not respond to commands, does not withdraw to pain however will grimace, No acute respiratory distress Eyes: negative retinal hemorrhage ENT: Negative Runny nose negative gingival bleeding Neck:  Negative scars, masses, torticollis, lymphadenopathy, JVD Lungs: tachypnea, Clear to auscultation bilaterally without wheezes or crackles Cardiovascular: Tachycardic, Regular rhythm without murmur gallop or rub normal S1 and S2 Abdomen: Unable to assess  Extremities: No significant cyanosis. All extremities in varying state of contracture, skin breakdown beginning bilateral upper extremity antecubital secondary to contractures Psychiatric:  Unable to assess  Neurologic:  Unable to assess      Data Reviewed: Basic Metabolic Panel:  Recent Labs Lab 11/12/14 1614  11/13/14 1159 11/13/14 1700 11/14/14 0330 11/15/14 0600 11/15/14 1700 11/16/14 0500  NA 164*  < > 165* 162* 157* 147*  --  145  K 5.0  < > 3.5 3.8 3.4* 2.8* 3.5 3.1*  CL >130*  < > 128* 128* 123* 113*  --  117*    CO2 19*  < > --  23  GLUCOSE 113*  < > 176* 74 115* 114*  --  340*  BUN 24*  < > 31* 33* 32* 25*  --  14  CREATININE 0.94  < > 1.13* 1.06* 1.09* 1.01*  --  0.84  CALCIUM 5.3*  < > 8.0* 8.1* 8.1* 7.5*  --  7.2*  MG 1.5*  --   --   --  2.0 1.7 2.2 1.9  PHOS 1.8*  --   --   --  2.2*  --   --   --   < > = values in this interval not displayed. Liver Function Tests:  Recent Labs Lab 11/12/14 1205 11/12/14 1614 11/15/14 0600 11/16/14 0500  AST 77* 47* 55* 40  ALT 32 19 33 27  ALKPHOS 82 39 77 66  BILITOT 1.3* 0.6 0.9 0.7  PROT 4.6* <3.0* 3.6* 3.3*  ALBUMIN 2.0* <1.0* 1.3* 1.1*    Recent Labs Lab 11/12/14 1614  LIPASE 11*  AMYLASE 47   No results for input(s): AMMONIA in the last 168 hours. CBC:  Recent Labs Lab 11/12/14 1205  11/12/14 2120 11/13/14 0430 11/14/14 0330 11/15/14 0600 11/16/14 0500  WBC 7.0  < > 9.1 6.4 5.8 5.7 4.4  NEUTROABS 5.4  --   --   --   --  4.7 3.6  HGB 12.3  < > 10.2* 8.8* 9.0* 8.3* 7.2*  HCT 40.1  < > 33.3* 28.7* 29.3* 25.8* 22.6*  MCV 123.8*  < > 123.3* 123.2* 122.6* 117.3* 116.5*  PLT 35*  < > 28* 22* 19* 13* 11*  < > = values in this interval not displayed. Cardiac Enzymes:  Recent Labs Lab 11/12/14 1614 11/15/14 1700 11/16/14 0300  TROPONINI 0.51* 0.15* 0.14*   BNP (last 3 results) No results for input(s): BNP in the last 8760 hours.  ProBNP (last 3 results) No results for input(s): PROBNP in the last 8760 hours.  CBG:  Recent Labs Lab 11/15/14 1147 11/15/14 1719 11/15/14 2103 11/16/14 0040 11/16/14 0452  GLUCAP 151* 155* 120* 121* 166*    Recent Results (from the past 240 hour(s))  Culture, blood (routine x 2)     Status: None (Preliminary result)   Collection Time: 11/12/14 12:05 PM  Result Value Ref Range Status   Specimen Description BLOOD RIGHT ARM  Final   Special Requests BOTTLES DRAWN AEROBIC AND ANAEROBIC 5CC  Final   Culture NO GROWTH 3 DAYS  Final   Report Status PENDING  Incomplete   Urine culture     Status: None   Collection Time: 11/12/14 12:24 PM  Result Value Ref Range Status   Specimen Description URINE, CLEAN CATCH  Final   Special Requests NONE  Final   Culture   Final    >=100,000 COLONIES/mL VIRIDANS STREPTOCOCCUS >=100,000 COLONIES/mL YEAST    Report Status 11/15/2014 FINAL  Final  Culture, blood (routine x 2)     Status: None (Preliminary result)   Collection Time: 11/12/14 12:35 PM  Result Value Ref Range Status   Specimen Description BLOOD RIGHT FOREARM  Final   Special Requests BOTTLES DRAWN AEROBIC ONLY 0.5CC  Final   Culture NO GROWTH 3 DAYS  Final   Report Status PENDING  Incomplete  MRSA PCR Screening     Status: Abnormal   Collection Time: 11/12/14  6:03 PM  Result Value Ref Range Status   MRSA by PCR POSITIVE (A) NEGATIVE Final    Comment:        The GeneXpert MRSA Assay (FDA approved for NASAL specimens only), is one component of a comprehensive MRSA colonization surveillance program. It is not intended to diagnose MRSA infection nor to guide or monitor treatment for MRSA infections. RESULT CALLED TO, READ BACK BY AND VERIFIED WITH: K.KELLY RN 2308 11/12/14 E.GADDY   Culture, Urine     Status: None (Preliminary result)   Collection Time: 11/12/14  9:22 PM  Result Value Ref Range Status   Specimen Description URINE, CATHETERIZED  Final   Special Requests NONE  Final   Culture >=100,000 COLONIES/mL YEAST  Final   Report Status PENDING  Incomplete     Studies:  Recent x-ray studies have been reviewed in detail by the Attending Physician  Scheduled Meds:  Scheduled Meds: . sodium chloride   Intravenous Once  . antiseptic oral rinse  7 mL Mouth Rinse q12n4p  . chlorhexidine  15 mL Mouth Rinse BID  . Chlorhexidine Gluconate Cloth  6 each Topical Q0600  . clonazePAM  0.25 mg Oral BID  . fluconazole (DIFLUCAN) IV  100 mg Intravenous Q24H  . free water  250 mL Per Tube Q4H  . hydrocortisone sod succinate (SOLU-CORTEF) inj   100 mg Intravenous Q12H  . insulin aspart  0-15 Units Subcutaneous 6 times per day  . mupirocin ointment  1 application Nasal BID  . piperacillin-tazobactam (ZOSYN)  IV  3.375 g Intravenous Q8H  . potassium chloride  10 mEq Intravenous Q1 Hr x 4  . valproate sodium  750 mg Intravenous 3 times per day  . vancomycin  750 mg Intravenous Q24H    Time spent on care of this patient: 40 mins  Drema Dallas , MD  [facility] Office  [phone] Pager [phone]  On-Call/Text Page:      [email or web address]      password [email or web address]  If 7PM-7AM, please contact night-coverage [email or web address] Password [email or web address] 11/16/2014, 11:06 AM   LOS: 4 days   Care during the described time interval was provided by me .  I have reviewed this patient's available data, including medical history, events of note, physical examination, and all test results as part of my evaluation. I have personally reviewed and interpreted all radiology studies.   Carolyne Littles, MD [phone] Pager

## 2014-11-16 NOTE — Progress Notes (Signed)
PRBC's finished at 2016.  Vital signs taken, all WNL.  RN will continue to monitor.

## 2014-11-17 ENCOUNTER — Inpatient Hospital Stay (HOSPITAL_COMMUNITY): Payer: Medicare Other

## 2014-11-17 DIAGNOSIS — G9341 Metabolic encephalopathy: Secondary | ICD-10-CM

## 2014-11-17 DIAGNOSIS — N3000 Acute cystitis without hematuria: Secondary | ICD-10-CM

## 2014-11-17 DIAGNOSIS — R7989 Other specified abnormal findings of blood chemistry: Secondary | ICD-10-CM

## 2014-11-17 DIAGNOSIS — E43 Unspecified severe protein-calorie malnutrition: Secondary | ICD-10-CM

## 2014-11-17 DIAGNOSIS — D638 Anemia in other chronic diseases classified elsewhere: Secondary | ICD-10-CM

## 2014-11-17 LAB — CBC WITH DIFFERENTIAL/PLATELET
Basophils Absolute: 0 10*3/uL (ref 0.0–0.1)
Basophils Relative: 0 % (ref 0–1)
EOS PCT: 0 % (ref 0–5)
Eosinophils Absolute: 0 10*3/uL (ref 0.0–0.7)
HCT: 29.5 % — ABNORMAL LOW (ref 36.0–46.0)
Hemoglobin: 10 g/dL — ABNORMAL LOW (ref 12.0–15.0)
Lymphocytes Relative: 11 % — ABNORMAL LOW (ref 12–46)
Lymphs Abs: 0.8 10*3/uL (ref 0.7–4.0)
MCH: 35.3 pg — ABNORMAL HIGH (ref 26.0–34.0)
MCHC: 33.9 g/dL (ref 30.0–36.0)
MCV: 104.2 fL — ABNORMAL HIGH (ref 78.0–100.0)
MONOS PCT: 5 % (ref 3–12)
Monocytes Absolute: 0.4 10*3/uL (ref 0.1–1.0)
NEUTROS PCT: 84 % — AB (ref 43–77)
Neutro Abs: 6 10*3/uL (ref 1.7–7.7)
Platelets: 34 10*3/uL — ABNORMAL LOW (ref 150–400)
RBC: 2.83 MIL/uL — AB (ref 3.87–5.11)
RDW: 25 % — ABNORMAL HIGH (ref 11.5–15.5)
WBC: 7.2 10*3/uL (ref 4.0–10.5)

## 2014-11-17 LAB — COMPREHENSIVE METABOLIC PANEL
ALBUMIN: 1.2 g/dL — AB (ref 3.5–5.0)
ALT: 28 U/L (ref 14–54)
AST: 41 U/L (ref 15–41)
Alkaline Phosphatase: 74 U/L (ref 38–126)
Anion gap: 5 (ref 5–15)
BILIRUBIN TOTAL: 0.9 mg/dL (ref 0.3–1.2)
BUN: 8 mg/dL (ref 6–20)
CO2: 22 mmol/L (ref 22–32)
Calcium: 6.9 mg/dL — ABNORMAL LOW (ref 8.9–10.3)
Chloride: 114 mmol/L — ABNORMAL HIGH (ref 101–111)
Creatinine, Ser: 0.81 mg/dL (ref 0.44–1.00)
GFR calc Af Amer: >60 mL/min (ref >60)
GFR calc non Af Amer: >60 mL/min (ref >60)
Glucose, Bld: 402 mg/dL — ABNORMAL HIGH (ref 65–99)
POTASSIUM: 3.2 mmol/L — AB (ref 3.5–5.1)
Sodium: 141 mmol/L (ref 135–145)
TOTAL PROTEIN: 3.8 g/dL — AB (ref 6.5–8.1)

## 2014-11-17 LAB — HEPATITIS PANEL, ACUTE
HCV Ab: <0.1 s/co ratio (ref 0.0–0.9)
HEP A IGM: NEGATIVE
HEP B S AG: NEGATIVE
Hep B C IgM: NEGATIVE

## 2014-11-17 LAB — PATHOLOGIST SMEAR REVIEW

## 2014-11-17 LAB — CULTURE, BLOOD (ROUTINE X 2)
CULTURE: NO GROWTH
Culture: NO GROWTH

## 2014-11-17 LAB — TYPE AND SCREEN
ABO/RH(D): A POS
Antibody Screen: NEGATIVE
Unit division: 0

## 2014-11-17 LAB — GLUCOSE, CAPILLARY
GLUCOSE-CAPILLARY: 130 mg/dL — AB (ref 65–99)
GLUCOSE-CAPILLARY: 117 mg/dL — AB (ref 65–99)
GLUCOSE-CAPILLARY: 139 mg/dL — AB (ref 65–99)
GLUCOSE-CAPILLARY: 128 mg/dL — AB (ref 65–99)
Glucose-Capillary: 105 mg/dL — ABNORMAL HIGH (ref 65–99)
Glucose-Capillary: 157 mg/dL — ABNORMAL HIGH (ref 65–99)

## 2014-11-17 LAB — URINE CULTURE: Culture: >=100000

## 2014-11-17 LAB — VITAMIN D 25 HYDROXY (VIT D DEFICIENCY, FRACTURES): VIT D 25 HYDROXY: 27.4 ng/mL — AB (ref 30.0–100.0)

## 2014-11-17 LAB — PREPARE PLATELET PHERESIS: Unit division: 0

## 2014-11-17 LAB — MAGNESIUM: Magnesium: 1.8 mg/dL (ref 1.7–2.4)

## 2014-11-17 MED ORDER — HYDROCORTISONE NA SUCCINATE PF 100 MG IJ SOLR
50.0000 mg | Freq: Two times a day (BID) | INTRAMUSCULAR | Status: DC
  Administered 2014-11-18 – 2014-11-19 (×3): 50 mg via INTRAVENOUS
  Filled 2014-11-17 (×5): qty 1

## 2014-11-17 MED ORDER — DEXTROSE-NACL 5-0.45 % IV SOLN
INTRAVENOUS | Status: DC
  Administered 2014-11-17 – 2014-11-19 (×3): via INTRAVENOUS

## 2014-11-17 NOTE — Progress Notes (Signed)
[facility] TEAM 1 - Stepdown/ICU TEAM Progress Note  Joan [name] WUJ:[id number] DOB: 03-02-1961 DOA: 11/12/2014 PCP: [name], MD  Admit HPI / Brief Narrative: 54yo F SNF resident w/ Hx catatonia, progressive neurodegenerative decline of unclear diagnosis (followed by Dr. Pearlean Brownie - Neurologist) on chronic steroids, functional quadriplegia,seizures, HTN, nonverbal/contracted at baseline who had a recent hospitalization for recurrent UTI [facility]) in setting chronic foley and presented 6/15 from her SNF due to hypernatremia and hypokalemia. In the ER she was found to be hypotensive with lactate 9.  CXR noted an infiltrate suggestive of PNA, and UA was suspicious for UTI.   HPI/Subjective: The patient opens her eyes when I entered the room but does not respond to the examiner.  She does not turn her head.  She makes no attempt to communicate or interact with me in any fashion.  There is no family present at the time of visit.  Assessment/Plan:  Metabolic encephalopathy on chronic encephalitis unknown origin -Patient on admission was hypernatremic with sodium 173, hypokalemic, bandemia and lactic acidosis -Patient with MCV> 100 but B-12/folate normal -EEG negative for evidence of seizure -reportedly at baseline pt is able to eat - not yet improved to that point at this time  Seizure d/o  -Followed at [facility] [place] - continue usual regimen  ?HCAP  -Continue current antibiotics for 7 days -Titrate O2 to maintain SPO2> 93% -PCXR from 6/16 not helpful -Patient has been afebrile since admission -Pro calcitonin has been elevated and is improving with antibiotics  Severe sepsis -Multifactorial to include HCAP, UTI (funguria/bacteria) -Continue current antibiotics -Diflucan IV 200 mg 1, then 100 mg daily,  UTI - Funguria -given high colony count on 2 separate urine collections as well as clinical symptoms, will tx and follow   VRE in urine -given low colony count I suspect this is a  colonizer   Elevated troponin -Most likely secondary to demand ischemia - no further w/u indicated   Relative adrenal insufficiency  -begin to wean stress dose steroids and follow  -cortisol 19.9  Severe hypernatremia  -Improving with hydration - cont free water and follow trend   Hypokalemia  -replace via IV as needed to goal of 4.0   Acute kidney failure  -Resolved   Anemia chronic disease? -anemia panel reveals no evidence of iron deficiency, B-12 deficiency, or folic acid deficiency -Transfuse for hemoglobin<7 -6/19 transfused 1 unit PRBC   Thrombocytopenia  -Most likely secondary to her infection. Currently no overt sign of bleeding  -avoid heparin products  Severe protein calorie malnutrition  -Reportedly was tolerating oral intake at facility - follow mental statusand resume her oral intake as allowed  Sacral decubitus ulcer/skin breakdown -Nature of multiple areas of skin breakdown previously described by Dr. Joseph Art - continue care  Code Status: FULL Family Communication: No family present at time of exam Disposition Plan: SDU  Consultants: PCCM Dr. [name] (neuro hospitalist)  Procedures:  6/18 echocardiogram LVEF=55% to 60%. - (grade 1 diastolic dysfunction) - Tricuspid valve: Moderate regurgitation. 6/19 transfuse 1 unit PRBC 6/19 transfuse 1 unit platelets  Antibiotics: Vancomycin 6/15 > Zosyn 6/15 > Diflucan 6/20 >  DVT prophylaxis: SCD  Objective: Blood pressure 104/90, pulse 84, temperature 96.1 F (35.6 C), temperature source Axillary, resp. rate 18, height  (1.651 m), weight 65.4 kg (144 lb 2.9 oz), SpO2 100 %.  Intake/Output Summary (Last 24 hours) at 11/17/14 1627 Last data filed at 11/17/14 1618  Gross per 24 hour  Intake   3910 ml  Output   1450 ml  Net   2460 ml   Exam: General: No acute respiratory distress evident Lungs: Poor air movement bilateral bases while laying in bed - no focal crackles or  wheezes Cardiovascular: Regular rate and rhythm without murmur gallop or rub normal S1 and S2 Abdomen: Nontender, nondistended, soft, bowel sounds positive, no rebound, no ascites, no appreciable mass Extremities: No significant cyanosis, clubbing, or edema bilateral lower extremities  Data Reviewed: Basic Metabolic Panel:  Recent Labs Lab 11/12/14 1614  11/13/14 1700 11/14/14 0330 11/15/14 0600 11/15/14 1700 11/16/14 0500 11/17/14 0433  NA 164*  < > 162* 157* 147*  --  145 141  K 5.0  < > 3.8 3.4* 2.8* 3.5 3.1* 3.2*  CL >130*  < > 128* 123* 113*  --  117* 114*  CO2 19*  < > --  23 22  GLUCOSE 113*  < > 74 115* 114*  --  340* 402*  BUN 24*  < > 33* 32* 25*  --  14 8  CREATININE 0.94  < > 1.06* 1.09* 1.01*  --  0.84 0.81  CALCIUM 5.3*  < > 8.1* 8.1* 7.5*  --  7.2* 6.9*  MG 1.5*  --   --  2.0 1.7 2.2 1.9 1.8  PHOS 1.8*  --   --  2.2*  --   --   --   --   < > = values in this interval not displayed.   Liver Function Tests:  Recent Labs Lab 11/12/14 1205 11/12/14 1614 11/15/14 0600 11/16/14 0500 11/17/14 0433  AST 77* 47* 55* 40 41  ALT 32 19 33 27 28  ALKPHOS 82 39 77 66 74  BILITOT 1.3* 0.6 0.9 0.7 0.9  PROT 4.6* <3.0* 3.6* 3.3* 3.8*  ALBUMIN 2.0* <1.0* 1.3* 1.1* 1.2*    Recent Labs Lab 11/12/14 1614  LIPASE 11*  AMYLASE 47   CBC:  Recent Labs Lab 11/12/14 1205  11/13/14 0430 11/14/14 0330 11/15/14 0600 11/16/14 0500 11/17/14 0433  WBC 7.0  < > 6.4 5.8 5.7 4.4 7.2  NEUTROABS 5.4  --   --   --  4.7 3.6 6.0  HGB 12.3  < > 8.8* 9.0* 8.3* 7.2* 10.0*  HCT 40.1  < > 28.7* 29.3* 25.8* 22.6* 29.5*  MCV 123.8*  < > 123.2* 122.6* 117.3* 116.5* 104.2*  PLT 35*  < > 22* 19* 13* 11* 34*  < > = values in this interval not displayed.   Cardiac Enzymes:  Recent Labs Lab 11/12/14 1614 11/15/14 1700 11/16/14 0300  TROPONINI 0.51* 0.15* 0.14*   CBG:  Recent Labs Lab 11/16/14 2028 11/17/14 0003 11/17/14 0403 11/17/14 0845 11/17/14 1136   GLUCAP 97 105* 130* 117* 139*    Recent Results (from the past 240 hour(s))  Culture, blood (routine x 2)     Status: None   Collection Time: 11/12/14 12:05 PM  Result Value Ref Range Status   Specimen Description BLOOD RIGHT ARM  Final   Special Requests BOTTLES DRAWN AEROBIC AND ANAEROBIC 5CC  Final   Culture NO GROWTH 5 DAYS  Final   Report Status 11/17/2014 FINAL  Final  Urine culture     Status: None   Collection Time: 11/12/14 12:24 PM  Result Value Ref Range Status   Specimen Description URINE, CLEAN CATCH  Final   Special Requests NONE  Final   Culture   Final    >=100,000 COLONIES/mL VIRIDANS STREPTOCOCCUS >=100,000  COLONIES/mL YEAST    Report Status 11/15/2014 FINAL  Final  Culture, blood (routine x 2)     Status: None   Collection Time: 11/12/14 12:35 PM  Result Value Ref Range Status   Specimen Description BLOOD RIGHT FOREARM  Final   Special Requests BOTTLES DRAWN AEROBIC ONLY 0.5CC  Final   Culture NO GROWTH 5 DAYS  Final   Report Status 11/17/2014 FINAL  Final  MRSA PCR Screening     Status: Abnormal   Collection Time: 11/12/14  6:03 PM  Result Value Ref Range Status   MRSA by PCR POSITIVE (A) NEGATIVE Final    Comment:        The GeneXpert MRSA Assay (FDA approved for NASAL specimens only), is one component of a comprehensive MRSA colonization surveillance program. It is not intended to diagnose MRSA infection nor to guide or monitor treatment for MRSA infections. RESULT CALLED TO, READ BACK BY AND VERIFIED WITH: K.KELLY RN 2308 11/12/14 E.GADDY   Culture, Urine     Status: None   Collection Time: 11/12/14  9:22 PM  Result Value Ref Range Status   Specimen Description URINE, CATHETERIZED  Final   Special Requests NONE  Final   Culture   Final    >=100,000 COLONIES/mL YEAST 30,000 COLONIES/mL VANCOMYCIN RESISTANT ENTEROCOCCUS ISOLATED    Report Status 11/17/2014 FINAL  Final   Organism ID, Bacteria YEAST  Final   Organism ID, Bacteria VANCOMYCIN  RESISTANT ENTEROCOCCUS ISOLATED  Final      Susceptibility   Vancomycin resistant enterococcus isolated - MIC*    AMPICILLIN >=32 RESISTANT Resistant     LEVOFLOXACIN >=8 RESISTANT Resistant     NITROFURANTOIN 128 RESISTANT Resistant     VANCOMYCIN >=32 RESISTANT Resistant     LINEZOLID 2 SENSITIVE Sensitive     * 30,000 COLONIES/mL VANCOMYCIN RESISTANT ENTEROCOCCUS ISOLATED     Studies:  Recent x-ray studies have been reviewed in detail by the Attending Physician  Scheduled Meds:  Scheduled Meds: . antiseptic oral rinse  7 mL Mouth Rinse q12n4p  . chlorhexidine  15 mL Mouth Rinse BID  . Chlorhexidine Gluconate Cloth  6 each Topical Q0600  . clonazePAM  0.25 mg Oral BID  . fluconazole (DIFLUCAN) IV  100 mg Intravenous Q24H  . free water  250 mL Per Tube Q4H  . hydrocortisone sod succinate (SOLU-CORTEF) inj  100 mg Intravenous Q12H  . insulin aspart  0-15 Units Subcutaneous 6 times per day  . mupirocin ointment  1 application Nasal BID  . piperacillin-tazobactam (ZOSYN)  IV  3.375 g Intravenous Q8H  . valproate sodium  750 mg Intravenous 3 times per day  . vancomycin  750 mg Intravenous Q24H    Time spent on care of this patient: 35 mins  Lonia Blood, MD [facility] For Consults/Admissions - Flow Manager - [phone] Office  [phone]  Contact MD directly via text page:      [email or web address]      password [facility]  11/17/2014, 4:27 PM   LOS: 5 days

## 2014-11-17 NOTE — Clinical Documentation Improvement (Signed)
"  Sacral decubitus ulcer  -Patient would stage II sacral decubitus ulcer and multiple other lacerations/ulcers on bilateral buttocks stage I.  -We'll place flexi seal to ensure area stays clean  -Will obtain KinAir bed and ensure on rotation at all times" is documented by Dr. Joseph Art in progress notes 11/15/14 and 11/16/14.  Please clarify and document if the ulcers noted above were:   - Present on Admission  - Not present on admission  - Unable to clinically determine   Thank You, Jerral Ralph ,RN Clinical Documentation Specialist:  [phone] [facility]- Health Information Management

## 2014-11-18 ENCOUNTER — Inpatient Hospital Stay (HOSPITAL_COMMUNITY): Payer: Medicare Other

## 2014-11-18 DIAGNOSIS — L89159 Pressure ulcer of sacral region, unspecified stage: Secondary | ICD-10-CM

## 2014-11-18 DIAGNOSIS — J9601 Acute respiratory failure with hypoxia: Secondary | ICD-10-CM

## 2014-11-18 DIAGNOSIS — N390 Urinary tract infection, site not specified: Secondary | ICD-10-CM | POA: Diagnosis present

## 2014-11-18 LAB — COMPREHENSIVE METABOLIC PANEL
ALT: 34 U/L (ref 14–54)
AST: 46 U/L — AB (ref 15–41)
Albumin: 1.4 g/dL — ABNORMAL LOW (ref 3.5–5.0)
Alkaline Phosphatase: 90 U/L (ref 38–126)
Anion gap: 5 (ref 5–15)
BILIRUBIN TOTAL: 0.8 mg/dL (ref 0.3–1.2)
BUN: 7 mg/dL (ref 6–20)
CALCIUM: 7.6 mg/dL — AB (ref 8.9–10.3)
CHLORIDE: 116 mmol/L — AB (ref 101–111)
CO2: 24 mmol/L (ref 22–32)
Creatinine, Ser: 0.66 mg/dL (ref 0.44–1.00)
Glucose, Bld: 74 mg/dL (ref 65–99)
Potassium: 3.5 mmol/L (ref 3.5–5.1)
Sodium: 145 mmol/L (ref 135–145)
Total Protein: 3.9 g/dL — ABNORMAL LOW (ref 6.5–8.1)

## 2014-11-18 LAB — GLUCOSE, CAPILLARY
GLUCOSE-CAPILLARY: 77 mg/dL (ref 65–99)
GLUCOSE-CAPILLARY: 91 mg/dL (ref 65–99)
Glucose-Capillary: 84 mg/dL (ref 65–99)
Glucose-Capillary: 105 mg/dL — ABNORMAL HIGH (ref 65–99)
Glucose-Capillary: 64 mg/dL — ABNORMAL LOW (ref 65–99)
Glucose-Capillary: 79 mg/dL (ref 65–99)
Glucose-Capillary: 91 mg/dL (ref 65–99)

## 2014-11-18 LAB — CBC
HEMATOCRIT: 33.5 % — AB (ref 36.0–46.0)
Hemoglobin: 11.4 g/dL — ABNORMAL LOW (ref 12.0–15.0)
MCH: 34.7 pg — AB (ref 26.0–34.0)
MCHC: 34 g/dL (ref 30.0–36.0)
MCV: 101.8 fL — AB (ref 78.0–100.0)
PLATELETS: 32 10*3/uL — AB (ref 150–400)
RBC: 3.29 MIL/uL — ABNORMAL LOW (ref 3.87–5.11)
RDW: 24.5 % — AB (ref 11.5–15.5)
WBC: 6.9 10*3/uL (ref 4.0–10.5)

## 2014-11-18 LAB — PHOSPHORUS: PHOSPHORUS: 3.3 mg/dL (ref 2.5–4.6)

## 2014-11-18 LAB — MAGNESIUM: Magnesium: 2 mg/dL (ref 1.7–2.4)

## 2014-11-18 NOTE — Progress Notes (Signed)
[facility] from [facility] called in regards to pt transfer. Stated that beds are tight and to please call in AM to [phone].

## 2014-11-18 NOTE — Progress Notes (Signed)
[place] TEAM 1 - Stepdown/ICU TEAM Progress Note  Joan [name] WUJ:[id number] DOB: 1960-06-30 DOA: 11/12/2014 PCP: [name], MD  Admit HPI / Brief Narrative: 54yo BF SNF resident PMHx catatonia, progressive neurodegenerative decline of unclear diagnosis (followed by Dr.Pramod S Sethi neurologist) on chronic steroids, functional quadriplegia, seizures, HTN, nonverbal/contracted at baseline. Recent hospitalization for recurrent UTI [facility]) in setting chronic foley.   Presented 6/15 from SNF r/t abnormal lab results including hypernatremia and hypokalemia. In ER found to be hypotensive with lactate=9, CXR with PNA, U/a suspicious for UTI and PCCM called to admit.    HPI/Subjective: 6/21 A/O 0. Patient moaning, but no purposeful movements, does not respond to commands. Spontaneously Open eyes with tearing, Muscle Tremors Apparent. Grimaces to painful stimuli..  Assessment/Plan: Metabolic encephalopathy on chronic encephalitis unknown origin?  -Patient on admission was hypernatremic with sodium 173, hypokalemic, bandemia and lactic acidosis -Patient with MCV> 100 B-12 elevated/folate normal -Dr. [name] (neuro hospitalist) was contacted secondary to belief that patient may be in status epilepticus. EEG performed negative -Patient still with negative improvement continue to correct electrolytes -Spoke with Mr. [name] (son), who states Dr. Audria Nine Doreen Beam (neurologist) at [facility] has accepted patient if we can transfer). [phone] -Patient's electrolyte imbalance now resolved without significant improvement in patient's status. -Spoke with IR, and they felt that there was no infiltration and considering the difficulty in obtaining the initial line the best course of action was not to remove the PIC/insert new PICC. I concurred with their recommendation. -Spoke with out Dr. Chuck Hint (neurologist) at [facility] and they have accepted her  for transfer in the a.m. Must contact PAL Line [phone] prior to transferring patient.    Seizures -Followed at [facility]  -Continue clonazepam 0.25 mg BID -Continue valproic acid 750 mg TID -See metabolic encephalopathy  Acute respiratory failure/HCAP  -Continue current antibiotics for 7 days -Titrate O2 to maintain SPO2> 93% -PCXR from 6/16 not helpful -Sputum culture if able - chest CT would be more appropriate given patient's upper extremity contracture.  Severe sepsis -Multifactorial to include HCAP, UTI (funguria/bacteria) -Continue current antibiotics -Diflucan IV 200 mg 1, then 100 mg daily, -Sepsis physiology resolved  UTI (Funguria/strep viridans) -See severe sepsis  Elevated troponin -Resolved   Relative adrenal insufficiency  -Continue Stress steroids Solu-Cortef 100 mg BID -cortisol 19.9  Severe hypernatremia  -Slowly Improving with hydration -Increase free water q 4hr  -Continue D5W-0.45% saline at 135ml/hr  Hypokalemia  -Resolved   Hypomagnesemia -Resolved    Acute kidney failure  -Resolved   Hypothermia -Apply a bear hugger maintain patient's body temperature 37 C  Anemia chronic disease? -Anemia panel -Transfuse for hemoglobin<7 -6/19 transfuse 1 unit PRBC   Thrombocytopenia  -Most likely secondary to her infection. Currently no overt sign of bleeding  -Would avoid all heparin products -Patient's platelets continue to decline to my: 6/19 transfuse 1 unit platelets. Patient has multiple lacerations which are oozing blood. -Most likely secondary to patient's ongoing infection, however will obtai; PBS not c/w HUS/TTP, HIV negative, hepatitis panel negative; B 12 high, folate normal   -Stable  Severe protein calorie malnutrition  -Still holding off on tube feeds secondary to poor cognition   -PEG tube prior to discharge?   Sacral decubitus ulcer/skin breakdown -Patient would stage II sacral decubitus ulcer and multiple  other lacerations/ulcers on bilateral buttocks stage I. -Stage I skin breakdown bilateral upper extremity antecubital spaces -Continue flexi seal to ensure area stays clean -  Continue KinAir bed and ensure on rotation at all times. -vitamin C, zinc, selenium levels pending -Vitamin D 25 OH low    Code Status: FULL Family Communication: No family present at time of exam Disposition Plan: [facility] in [facility]. ensure you contact PAL Line prior to transfer   Consultants: Dr.Wesam Santa Genera [facility] M) Dr. [name] (neuro hospitalist) Phone consult to [facility] Dr. Chuck Hint (neurologist)    Procedure/Significant Events:  6/18 echocardiogram;- LVEF=55% to 60%. - (grade 1 diastolic dysfunction). - Tricuspid valve: Moderate regurgitation. 6/19 transfuse 1 unit PRBC 6/19 transfuse 1 unit platelets 6/19 PBS;increased nucleated red blood cells. Granulocytic left shift. Thrombocytopenia.     Culture 6/15 blood right arm/forearm NGTD 6/15 urine positive strep viridans/yeast  6/15 MRSA positive 6/15 urine pending 6/19  acute hepatitis panel negative    Antibiotics: Vancomycin 6/15>> stopped 6/20 Zosyn 6/15>>> stopped 6/21 Diflucan 6/17>>  DVT prophylaxis: SCD   Devices    LINES / TUBES:  6/15 CVL RUA PICC    Continuous Infusions: . dextrose 5 % and 0.45% NaCl 100 mL/hr at 11/18/14 0549    Objective: VITAL SIGNS: Temp: 97.8 F (36.6 C) (06/21 2008) Temp Source: Axillary (06/21 2008) BP: 112/57 mmHg (06/21 1215) Pulse Rate: 104 (06/21 2008) SPO2; FIO2:   Intake/Output Summary (Last 24 hours) at 11/18/14 2017 Last data filed at 11/18/14 1547  Gross per 24 hour  Intake 863.33 ml  Output   1275 ml  Net -411.67 ml     Exam: General: A/O 0 (catatonic), eyes spontaneously open, does not respond to commands, does not withdraw to pain however will grimace, No acute respiratory distress Eyes: negative retinal hemorrhage ENT:  Negative Runny nose negative gingival bleeding Neck:  Negative scars, masses, torticollis, lymphadenopathy, JVD Lungs: tachypnea, Clear to auscultation bilaterally without wheezes or crackles Cardiovascular: Tachycardic, Regular rhythm without murmur gallop or rub normal S1 and S2 Abdomen: Unable to assess  Extremities: No significant cyanosis. All extremities in varying state of contracture, skin breakdown beginning bilateral upper extremity antecubital secondary to contractures. Note right bicep has become hard with blood/fluid around the PICC line dressing C/W infiltration of line. Psychiatric:  Unable to assess  Neurologic:  Unable to assess      Data Reviewed: Basic Metabolic Panel:  Recent Labs Lab 11/12/14 1614  11/14/14 0330 11/15/14 0600 11/15/14 1700 11/16/14 0500 11/17/14 0433 11/18/14 0617  NA 164*  < > 157* 147*  --  145 141 145  K 5.0  < > 3.4* 2.8* 3.5 3.1* 3.2* 3.5  CL >130*  < > 123* 113*  --  117* 114* 116*  CO2 19*  < > 30 26  --  23 22 24   GLUCOSE 113*  < > 115* 114*  --  340* 402* 74  BUN 24*  < > 32* 25*  --  14 8 7   CREATININE 0.94  < > 1.09* 1.01*  --  0.84 0.81 0.66  CALCIUM 5.3*  < > 8.1* 7.5*  --  7.2* 6.9* 7.6*  MG 1.5*  --  2.0 1.7 2.2 1.9 1.8 2.0  PHOS 1.8*  --  2.2*  --   --   --   --  3.3  < > = values in this interval not displayed. Liver Function Tests:  Recent Labs Lab 11/12/14 1614 11/15/14 0600 11/16/14 0500 11/17/14 0433 11/18/14 0617  AST 47* 55* 40 41 46*  ALT 19 33 27 28 34  ALKPHOS 39 77 66  74 90  BILITOT 0.6 0.9 0.7 0.9 0.8  PROT <3.0* 3.6* 3.3* 3.8* 3.9*  ALBUMIN <1.0* 1.3* 1.1* 1.2* 1.4*    Recent Labs Lab 11/12/14 1614  LIPASE 11*  AMYLASE 47   No results for input(s): AMMONIA in the last 168 hours. CBC:  Recent Labs Lab 11/12/14 1205  11/14/14 0330 11/15/14 0600 11/16/14 0500 11/17/14 0433 11/18/14 0613  WBC 7.0  < > 5.8 5.7 4.4 7.2 6.9  NEUTROABS 5.4  --   --  4.7 3.6 6.0  --   HGB 12.3  < > 9.0*  8.3* 7.2* 10.0* 11.4*  HCT 40.1  < > 29.3* 25.8* 22.6* 29.5* 33.5*  MCV 123.8*  < > 122.6* 117.3* 116.5* 104.2* 101.8*  PLT 35*  < > 19* 13* 11* 34* 32*  < > = values in this interval not displayed. Cardiac Enzymes:  Recent Labs Lab 11/12/14 1614 11/15/14 1700 11/16/14 0300  TROPONINI 0.51* 0.15* 0.14*   BNP (last 3 results) No results for input(s): BNP in the last 8760 hours.  ProBNP (last 3 results) No results for input(s): PROBNP in the last 8760 hours.  CBG:  Recent Labs Lab 11/18/14 0540 11/18/14 0734 11/18/14 1214 11/18/14 1751 11/18/14 1851  GLUCAP 77 84 105* 64* 79    Recent Results (from the past 240 hour(s))  Culture, blood (routine x 2)     Status: None   Collection Time: 11/12/14 12:05 PM  Result Value Ref Range Status   Specimen Description BLOOD RIGHT ARM  Final   Special Requests BOTTLES DRAWN AEROBIC AND ANAEROBIC 5CC  Final   Culture NO GROWTH 5 DAYS  Final   Report Status 11/17/2014 FINAL  Final  Urine culture     Status: None   Collection Time: 11/12/14 12:24 PM  Result Value Ref Range Status   Specimen Description URINE, CLEAN CATCH  Final   Special Requests NONE  Final   Culture   Final    >=100,000 COLONIES/mL VIRIDANS STREPTOCOCCUS >=100,000 COLONIES/mL YEAST    Report Status 11/15/2014 FINAL  Final  Culture, blood (routine x 2)     Status: None   Collection Time: 11/12/14 12:35 PM  Result Value Ref Range Status   Specimen Description BLOOD RIGHT FOREARM  Final   Special Requests BOTTLES DRAWN AEROBIC ONLY 0.5CC  Final   Culture NO GROWTH 5 DAYS  Final   Report Status 11/17/2014 FINAL  Final  MRSA PCR Screening     Status: Abnormal   Collection Time: 11/12/14  6:03 PM  Result Value Ref Range Status   MRSA by PCR POSITIVE (A) NEGATIVE Final    Comment:        The GeneXpert MRSA Assay (FDA approved for NASAL specimens only), is one component of a comprehensive MRSA colonization surveillance program. It is not intended to diagnose  MRSA infection nor to guide or monitor treatment for MRSA infections. RESULT CALLED TO, READ BACK BY AND VERIFIED WITH: K.KELLY RN 2308 11/12/14 E.GADDY   Culture, Urine     Status: None   Collection Time: 11/12/14  9:22 PM  Result Value Ref Range Status   Specimen Description URINE, CATHETERIZED  Final   Special Requests NONE  Final   Culture   Final    >=100,000 COLONIES/mL YEAST 30,000 COLONIES/mL VANCOMYCIN RESISTANT ENTEROCOCCUS ISOLATED    Report Status 11/17/2014 FINAL  Final   Organism ID, Bacteria YEAST  Final   Organism ID, Bacteria VANCOMYCIN RESISTANT ENTEROCOCCUS ISOLATED  Final  Susceptibility   Vancomycin resistant enterococcus isolated - MIC*    AMPICILLIN >=32 RESISTANT Resistant     LEVOFLOXACIN >=8 RESISTANT Resistant     NITROFURANTOIN 128 RESISTANT Resistant     VANCOMYCIN >=32 RESISTANT Resistant     LINEZOLID 2 SENSITIVE Sensitive     * 30,000 COLONIES/mL VANCOMYCIN RESISTANT ENTEROCOCCUS ISOLATED     Studies:  Recent x-ray studies have been reviewed in detail by the Attending Physician  Scheduled Meds:  Scheduled Meds: . antiseptic oral rinse  7 mL Mouth Rinse q12n4p  . chlorhexidine  15 mL Mouth Rinse BID  . clonazePAM  0.25 mg Oral BID  . fluconazole (DIFLUCAN) IV  100 mg Intravenous Q24H  . hydrocortisone sod succinate (SOLU-CORTEF) inj  50 mg Intravenous Q12H  . insulin aspart  0-15 Units Subcutaneous 6 times per day  . piperacillin-tazobactam (ZOSYN)  IV  3.375 g Intravenous Q8H  . valproate sodium  750 mg Intravenous 3 times per day    Time spent on care of this patient: 40 mins   WOODS, Roselind Messier , MD  [facility] Office  [phone] Pager - [phone]  On-Call/Text Page:      [email or web address]      password [email or web address]  If 7PM-7AM, please contact night-coverage [email or web address] Password [facility] 11/18/2014, 8:17 PM   LOS: 6 days   Care during the described time interval was provided by me .  I have reviewed this patient's  available data, including medical history, events of note, physical examination, and all test results as part of my evaluation. I have personally reviewed and interpreted all radiology studies.   Carolyne Littles, MD [phone] Pager

## 2014-11-19 DIAGNOSIS — T68XXXD Hypothermia, subsequent encounter: Secondary | ICD-10-CM

## 2014-11-19 DIAGNOSIS — E2740 Unspecified adrenocortical insufficiency: Secondary | ICD-10-CM

## 2014-11-19 LAB — GLUCOSE, CAPILLARY
GLUCOSE-CAPILLARY: 108 mg/dL — AB (ref 65–99)
Glucose-Capillary: 114 mg/dL — ABNORMAL HIGH (ref 65–99)
Glucose-Capillary: 126 mg/dL — ABNORMAL HIGH (ref 65–99)
Glucose-Capillary: 89 mg/dL (ref 65–99)

## 2014-11-19 LAB — SELENIUM SERUM: SELENIUM: 40 ug/L — AB (ref 63–160)

## 2014-11-19 MED ORDER — DEXTROSE-NACL 5-0.45 % IV SOLN: 100.0000 mL/h | INTRAVENOUS | Status: DC

## 2014-11-19 MED ORDER — CETYLPYRIDINIUM CHLORIDE 0.05 % MT LIQD: 7.0000 mL | Freq: Two times a day (BID) | OROMUCOSAL | Status: DC

## 2014-11-19 MED ORDER — LEVALBUTEROL HCL 0.63 MG/3ML IN NEBU: 0.6300 mg | INHALATION_SOLUTION | RESPIRATORY_TRACT | Status: DC | PRN

## 2014-11-19 MED ORDER — CLONAZEPAM 0.5 MG PO TABS: 0.2500 mg | ORAL_TABLET | Freq: Two times a day (BID) | ORAL | Status: DC

## 2014-11-19 MED ORDER — ONDANSETRON HCL 4 MG/2ML IJ SOLN: 4.0000 mg | Freq: Four times a day (QID) | INTRAMUSCULAR | Status: DC | PRN

## 2014-11-19 MED ORDER — HYDROCORTISONE NA SUCCINATE PF 100 MG IJ SOLR: 50.0000 mg | Freq: Every day | INTRAMUSCULAR | Status: DC

## 2014-11-19 MED ORDER — INSULIN ASPART 100 UNIT/ML ~~LOC~~ SOLN: 0.0000 [IU] | SUBCUTANEOUS | Status: DC

## 2014-11-19 MED ORDER — PIPERACILLIN-TAZOBACTAM 3.375 G IVPB: 3.3750 g | Freq: Three times a day (TID) | INTRAVENOUS | Status: DC

## 2014-11-19 MED ORDER — FLUCONAZOLE 100MG IVPB: 100.0000 mg | INTRAVENOUS | Status: DC

## 2014-11-19 MED ORDER — CHLORHEXIDINE GLUCONATE 0.12 % MT SOLN: 15.0000 mL | Freq: Two times a day (BID) | OROMUCOSAL | Status: DC

## 2014-11-19 MED ORDER — VALPROATE SODIUM 500 MG/5ML IV SOLN: 750.0000 mg | Freq: Three times a day (TID) | INTRAVENOUS | Status: DC

## 2014-11-19 NOTE — Progress Notes (Signed)
[facility] hospital called stating that bed is available for pt, gave number for day shift nurse to call report to. PICC line dressing changed this morning because dressing was soiled, discharge noted under dressing along with ecchymosis around insertion site. Will pass information along to day shift RN.

## 2014-11-19 NOTE — Discharge Summary (Signed)
DISCHARGE SUMMARY  Joan [name]  MR#: [id number]  DOB:Sep 04, 1960  Date of Admission: 11/12/2014 Date of Discharge: 11/19/2014  Attending Physician:MCCLUNG,JEFFREY T  Patient's WUJ:[id number], Joan Pigg, MD - [facility]   Consults: PCCM Dr. [name] (Neurohospitalist) Phone consult to [facility] Dr. 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 Thrombocytopenia  Idiopathic coagulopathy  Severe protein calorie malnutrition  Sacral decubitus ulcer/skin breakdown - present at admission   Initial presentation: 54yo F SNF resident w/ Hx catatonia, progressive neurodegenerative decline of unclear diagnosis (followed by Dr. Pearlean Brownie - Neurologist) on chronic steroids, functional quadriplegia,seizures, HTN, nonverbal/contracted at baseline who had a recent hospitalization for recurrent UTI [facility] in setting chronic foley and presented 6/15 from her SNF due to hypernatremia and hypokalemia. In the ER she was found to be hypotensive with lactate 9. CXR noted an infiltrate suggestive of PNA, and UA was suspicious for UTI.   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 Joseph Art spoke with Mr. Joan [name] (son), who stated Dr. Mertha Baars (Neurologist) at [facility] had accepted patient if we can  transfer -Dr. Joseph Art spoke with Dr. Chuck Hint (Neurologist) at [facility] and they have accepted her for transfer    Seizure d/o  -Followed at [facility] - continue usual medical regimen - EEG w/o evidence of acute seizure/status  ?HCAP  -Continue current antibiotics for planned 7 day course -Titrate O2 to maintain SPO2> 93% -PCXR from 6/16 not helpful due to obstructing position of arms  -Patient has been afebrile since admission -Pro calcitonin has been elevated and is improving with antibiotics  Severe sepsis -Multifactorial to include HCAP, UTI (funguria/bacteria) -Continue current antibimicrobials for planned 7 day course   UTI - Funguria -given high colony count on 2 separate urine collections (>100k) as well as clinical symptoms, will tx and follow - cont diflucan IV   Strep Viridans in Urine Cx (>100k) -should be adequately covered w/ current abx regimen   VRE in urine -given low colony count (30k) I suspect this is a colonizer   Elevated troponin -Most likely secondary to demand ischemia - no further w/u indicated   Relative adrenal insufficiency  -cortisol actually 36.1 but pt chronically on prednisone - unclear timing of cortisol dosing v/s initiation of stress dose steroids - stop stress dose steroids and follow as use of stress dose steroids at time of admit has not changed clinical course   Severe hypernatremia  -resolved with hydration - appears to have been due to simple true dehydration   Hypokalemia  -replaced via IV as needed to goal of 4.0   Acute kidney failure  -Resolved - appears to have been simple pre-renal azotemia   Anemia chronic disease? -anemia panel reveals no evidence of iron deficiency, B-12 deficiency, or folic acid deficiency -Transfuse for hemoglobin < 7 -6/19 transfused 1 unit PRBC  Thrombocytopenia  -Most likely secondary to her infection - currently no overt  sign of bleeding  -avoid heparin products -transfused  plts 6/19 due to bleeding from multiple skin lacerations  -peripheral smear not c/w HUS/TTP, HIV negative, hepatitis panel negative; B 12 high, folate normal   Coagulopathy -unclear etiology - likely nutritional in nature - recheck PT/PTT suggested in 24hrs   Severe protein calorie malnutrition  -Reportedly was tolerating oral intake at facility - follow mental statusand resume her oral intake as allowed - if mental status does not soon improve will have to discuss temporary feed tube +/- PEG  Sacral decubitus ulcer/skin breakdown - present at admission  -stage II sacral decubitus ulcer and multiple other lacerations/ulcers on bilateral buttocks stage I all present at time of admission  -Stage I skin breakdown bilateral upper extremity antecubital spaces -Continue flexi seal to ensure area stays clean -Continue KinAir bed and ensure on rotation at all times -Vitamin C, zinc, selenium levels pending -Vitamin D 25 OH low  Procedures:  6/17 EEG generalized irregular slow activity - no seizure or seizure predisposition recorded 6/18 echocardiogram LVEF=55% to 60%. - (grade 1 diastolic dysfunction) - Tricuspid valve: Moderate regurgitation. 6/19 transfuse 1 unit PRBC 6/19 transfuse 1 unit platelets  Antibiotics: Vancomycin 6/15 > 6/21 Zosyn 6/15 > Diflucan 6/17 >  DVT prophylaxis: SCD    Medication List    STOP taking these medications        b complex vitamins tablet     feeding supplement (PRO-STAT SUGAR FREE 64) Liqd     feeding supplement (RESOURCE BREEZE) Liqd     multivitamin with minerals Tabs tablet     nitrofurantoin 50 MG capsule  Commonly known as:  MACRODANTIN     predniSONE 20 MG tablet  Commonly known as:  DELTASONE     Valproic Acid 250 MG/5ML Syrp syrup  Commonly known as:  DEPAKENE      TAKE these medications        antiseptic oral rinse 0.05 % Liqd solution  Commonly known as:  CPC / CETYLPYRIDINIUM CHLORIDE 0.05%  7 mLs by Mouth Rinse  route 2 times daily at 12 noon and 4 pm.     chlorhexidine 0.12 % solution  Commonly known as:  PERIDEX  15 mLs by Mouth Rinse route 2 (two) times daily.     clonazePAM 0.5 MG tablet  Commonly known as:  KLONOPIN  Take 0.5 tablets (0.25 mg total) by mouth 2 (two) times daily.     dextrose 5 % and 0.45% NaCl infusion  Inject 100 mL/hr into the vein continuous.     fluconazole 2 mg/mL Soln IVPB  Commonly known as:  DIFLUCAN  Inject 50 mLs (100 mg total) into the vein daily.     hydrocortisone sodium succinate 100 MG Solr injection  Commonly known as:  SOLU-CORTEF  Inject 1 mL (50 mg total) into the vein daily.  Start taking on:  11/20/2014     insulin aspart 100 UNIT/ML injection  Commonly known as:  novoLOG  Inject 0-15 Units into the skin every 4 (four) hours.     levalbuterol 0.63 MG/3ML nebulizer solution  Commonly known as:  XOPENEX  Take 3 mLs (0.63 mg total) by nebulization every 3 (three) hours as needed for wheezing or shortness of breath.     ondansetron 4 MG/2ML Soln injection  Commonly known as:  ZOFRAN  Inject 2 mLs (4 mg total) into the vein every 6 (six) hours as needed for nausea.     piperacillin-tazobactam 3.375 GM/50ML IVPB  Commonly known as:  ZOSYN  Inject 50 mLs (3.375 g total) into the vein every 8 (eight) hours.     valproate 750 mg in dextrose 5 % 50 mL  Inject 750 mg into the vein every 8 (eight) hours.        Day of Discharge BP 133/82 mmHg  Pulse 82  Temp(Src) 96 F (35.6 C) (Axillary)  Resp 17  Ht  (1.651 m)  Wt 64 kg (141 lb 1.5 oz)  BMI 23.48 kg/m2  SpO2 100%  Physical Exam: General: No acute respiratory distress  Neuro:  Opens eyes but does not follow examiner - arms contracted and held on chest - no spontaneous movements Lungs: Clear to auscultation bilaterally without wheezes or crackles Cardiovascular: Regular rate and rhythm without murmur gallop or rub normal S1 and S2 Abdomen: Nontender, nondistended, soft, bowel  sounds positive, no rebound, no ascites, no appreciable mass Extremities: No significant cyanosis, or clubbing, diffuse 1+ edema  Basic Metabolic Panel:  Recent Labs Lab 11/12/14 1614  11/14/14 0330 11/15/14 0600 11/15/14 1700 11/16/14 0500 11/17/14 0433 11/18/14 0617  NA 164*  < > 157* 147*  --  145 141 145  K 5.0  < > 3.4* 2.8* 3.5 3.1* 3.2* 3.5  CL >130*  < > 123* 113*  --  117* 114* 116*  CO2 19*  < > 30 26  --  GLUCOSE 113*  < > 115* 114*  --  340* 402* 74  BUN 24*  < > 32* 25*  --  CREATININE 0.94  < > 1.09* 1.01*  --  0.84 0.81 0.66  CALCIUM 5.3*  < > 8.1* 7.5*  --  7.2* 6.9* 7.6*  MG 1.5*  --  2.0 1.7 2.2 1.9 1.8 2.0  PHOS 1.8*  --  2.2*  --   --   --   --  3.3  < > = values in this interval not displayed.  Liver Function Tests:  Recent Labs Lab 11/12/14 1614 11/15/14 0600 11/16/14 0500 11/17/14 0433 11/18/14 0617  AST 47* 55* 40 41 46*  ALT 19 33 27 28 34  ALKPHOS 39 77 66 74 90  BILITOT 0.6 0.9 0.7 0.9 0.8  PROT <3.0* 3.6* 3.3* 3.8* 3.9*  ALBUMIN <1.0* 1.3* 1.1* 1.2* 1.4*    Recent Labs Lab 11/12/14 1614  LIPASE 11*  AMYLASE 47   Coags:  Recent Labs Lab 11/12/14 1614  INR 3.48*   CBC:  Recent Labs Lab 11/12/14 1205  11/14/14 0330 11/15/14 0600 11/16/14 0500 11/17/14 0433 11/18/14 0613  WBC 7.0  < > 5.8 5.7 4.4 7.2 6.9  NEUTROABS 5.4  --   --  4.7 3.6 6.0  --   HGB 12.3  < > 9.0* 8.3* 7.2* 10.0* 11.4*  HCT 40.1  < > 29.3* 25.8* 22.6* 29.5* 33.5*  MCV 123.8*  < > 122.6* 117.3* 116.5* 104.2* 101.8*  PLT 35*  < > 19* 13* 11* 34* 32*  < > = values in this interval not displayed.  Cardiac Enzymes:  Recent Labs Lab 11/12/14 1614 11/15/14 1700 11/16/14 0300  TROPONINI 0.51* 0.15* 0.14*   CBG:  Recent Labs Lab 11/18/14 1851 11/18/14 2005 11/19/14 0006 11/19/14 0411 11/19/14 0743  GLUCAP 79 91 126* 108* 89    Recent Results (from the past 240 hour(s))  Culture, blood (routine x 2)     Status: None    Collection Time: 11/12/14 12:05 PM  Result Value Ref Range  Status   Specimen Description BLOOD RIGHT ARM  Final   Special Requests BOTTLES DRAWN AEROBIC AND ANAEROBIC 5CC  Final   Culture NO GROWTH 5 DAYS  Final   Report Status 11/17/2014 FINAL  Final  Urine culture     Status: None   Collection Time: 11/12/14 12:24 PM  Result Value Ref Range Status   Specimen Description URINE, CLEAN CATCH  Final   Special Requests NONE  Final   Culture   Final    >=100,000 COLONIES/mL VIRIDANS STREPTOCOCCUS >=100,000 COLONIES/mL YEAST    Report Status 11/15/2014 FINAL  Final  Culture, blood (routine x 2)     Status: None   Collection Time: 11/12/14 12:35 PM  Result Value Ref Range Status   Specimen Description BLOOD RIGHT FOREARM  Final   Special Requests BOTTLES DRAWN AEROBIC ONLY 0.5CC  Final   Culture NO GROWTH 5 DAYS  Final   Report Status 11/17/2014 FINAL  Final  MRSA PCR Screening     Status: Abnormal   Collection Time: 11/12/14  6:03 PM  Result Value Ref Range Status   MRSA by PCR POSITIVE (A) NEGATIVE Final    Comment:        The GeneXpert MRSA Assay (FDA approved for NASAL specimens only), is one component of a comprehensive MRSA colonization surveillance program. It is not intended to diagnose MRSA infection nor to guide or monitor treatment for MRSA infections. RESULT CALLED TO, READ BACK BY AND VERIFIED WITH: K.KELLY RN 2308 11/12/14 E.GADDY   Culture, Urine     Status: None   Collection Time: 11/12/14  9:22 PM  Result Value Ref Range Status   Specimen Description URINE, CATHETERIZED  Final   Special Requests NONE  Final   Culture   Final    >=100,000 COLONIES/mL YEAST 30,000 COLONIES/mL VANCOMYCIN RESISTANT ENTEROCOCCUS ISOLATED    Report Status 11/17/2014 FINAL  Final   Organism ID, Bacteria YEAST  Final   Organism ID, Bacteria VANCOMYCIN RESISTANT ENTEROCOCCUS ISOLATED  Final      Susceptibility   Vancomycin resistant enterococcus isolated - MIC*    AMPICILLIN  >=32 RESISTANT Resistant     LEVOFLOXACIN >=8 RESISTANT Resistant     NITROFURANTOIN 128 RESISTANT Resistant     VANCOMYCIN >=32 RESISTANT Resistant     LINEZOLID 2 SENSITIVE Sensitive     * 30,000 COLONIES/mL VANCOMYCIN RESISTANT ENTEROCOCCUS ISOLATED      Time spent in discharge (includes decision making & examination of pt): >35 minutes  11/19/2014, 9:43 AM   Lonia Blood, MD [facility] Office  [phone] Pager [phone]  On-Call/Text Page:      [email or web address]      password [facility]

## 2014-11-19 NOTE — Progress Notes (Signed)
11/19/2014 Report was given to Chad Cordial (RN) at 11:15am At [facility]. Rn made nurse aware  patient temperature was 96.1 axillary. Charge nurse at [facility] was made aware of patient temp. Dawn Wyrick (RN) transfer nurse called to made sure patient was appropriate for the [employer] [phone]. Rn receive call back from Mrs Linna Darner per Dr Chuck Hint (neurology) patient was appropriate for that floor. [facility] RN.

## 2014-11-19 NOTE — Progress Notes (Signed)
11/19/2014 patient temp was 96.1 axillary at 1140 am and Dr Sharon Seller  Is aware. [facility] RN.

## 2014-11-19 NOTE — Progress Notes (Signed)
11/19/2014 patient left at 1445 via care link heart rate 57 to 60's and patient was stable before being tranfser. [facility] RN.

## 2014-11-24 LAB — ZINC

## 2014-11-28 LAB — VITAMIN C: VITAMIN C: 0.2 mg/dL (ref 0.2–1.5)

## 2015-01-27 NOTE — Progress Notes (Signed)
 Neurology Follow Up Note                                                                                                     Referring Physician: Melony Munroe, MD  Reason For Consultation: Abnormal MRI  History of Present Illness  Joan [name] is a 54 y.o. right handed female followed by neurology at [facility].  She has a neuro degenerative disorder.  Initially treated as autoimmune encephalitis.  Patient has continues to decline.  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  Patient was placed in hospice for time.  Currently she has no meaningful interaction.  Family requested 2nd opinion from Neurology.  MRI was repeated as well as EEG.  No status epilepticus identified to explain her mental status.  MRI of the brain continued show bilateral basal ganglial hyperintensity is seen on previous MRI in July at [facility].  She has had lumbar puncture paraneoplastic workup already.  Past Medical and Surgical History  UTA from patient due to AMS  Family History  UTA from patient due to AMS  Social History  UTA from patient due to AMS  Medications and Allergies  The paper chart.  Allergies  Allergen Reactions   Doxycycline Other (See Comments)    Other Reaction: GI Upset   Morphine  Nausea   Penicillins Rash   Prednisone  Other (See Comments)    Other Reaction: GI Upset    Review of Systems  A fourteen system review of systems was unable to perform due to altered mental status.  The patient will follow up with the PCP for evaluation of non-neurologic symptoms.  Physical Examination  There were no vitals filed for this visit.  GENERAL:  54 y.o. female . Lungs are clear to auscultation bilaterally. Heart has a regular rate and rhythm. No carotid bruits auscultated.  MENTAL STATUS: The patient is lying in bed high his open.  She does not respond to verbal stimulation or commands.  She did not respond painful stimulation.  CRANIAL NERVES: Fundoscopic examination reveals sharp  optic discs bilaterally.  Are reactive bilaterally and left pupil larger than the right pupil.  The patient does not fixate with gaze.  No blink to threat.  Positive cough and gag.  MOTOR:  The patient is contracted does not respond to painful stimulation upper lower extremities.  Does not display with manual muscle testing.  SENSATION:  The patient does not withdrawal to painful stimulation in the upper lower extremities.  COORDINATION:  Unable to assess due to altered mental status.  DEEP TENDON REFLEXES:  Reduced throughout with upgoing toes bilaterally.  GAIT: Unable to assess due to altered mental status.  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  COMPARISONS: There is a report available in [facility] under the Care Everywhere tab of an outside brain MR performed back in July.  TECHNIQUE: Multiplanar  MR imaging of the brain is performed without contrast.  FINDINGS: The study is somewhat motion degraded. There is hyperintense T2, FLAIR and diffusion signal in the putamen bilaterally and also in the head of the caudate. The central gray structures and thalami appear uninvolved. The ADC map is normalized in these regions. There is the suggestion of hyperintense diffusion signal following the cortex in the high convexity over the frontal and parietal regions. This finding is somewhat subtle and could in fact be artifactual.  Additionally, there is hyperintense DWI and FLAIR signal in the central pons, greatest along the ventral and right margin of the pons, with corresponding hyperintense T2 and FLAIR signal in the same regions. The ADC map has normalized at this location also.  The sulcal and cisternal pattern is much  greater than expected for a patient this age but overall congruent with the ventricular size. There is no intra or extra-axial fluid collection, herniation, mass or midline shift. Susceptibility weighted sequences show no evidence of any hemosiderin deposition.  The sella is predominantly empty. The midbrain and cerebellar tonsils are properly positioned. No definite scalp or calvarial lesion. There is fluid in the mastoid air cells bilaterally. Otherwise the visualized globes, orbits and paranasal sinuses are unremarkable.  IMPRESSION: Hyperintense diffusion, T2 and FLAIR signal in the pons, putamen and also in the head of the caudate. 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 Consideration might also be given to a variety of toxic or other metabolic processes. Hypoxic ischemic insult could also cause this appearance and would potentially explain the subtle hyperintense cortical signal over the frontal and high convexities (if indeed this is a real finding and not artifactual). The changes in the brain today are similar when compared to the report of the brain MR performed at the outside facility.  Global cerebral atrophy greater than expected for a patient this age.  Electronically Signed by:  Lamar Fluke Electronically Signed on:  01/24/2015 11:42 AM   EEG - routine   Narrative   Joan DELENA Hazel, MD     01/26/2015  1:44 PM     [facility]  EEG REPORT  EEG Service Date: 01/26/2015 Date of Report: 01/26/2015  History: Joan [name] is a 54 y.o. female with a history of of  uncontrolled seizures who is undergoing an EEG to evaluate for  possible ongoing seizure activity. She currently has no  medications in their medication list..  Report: This EEG was acquired with electrodes placed according to  the [employer] 10-20 electrode system (including Fp1, Fp2, F3,  F4, C3, C4, P3, P4, O1,  O2, T3, T4, T5, T6, A1, A2, Fz, Cz, Pz).    The occipital dominant rhythm was 5 Hz. This activity is  reactive to stimulaiton.   Drowsiness was manifested by  background fragmentation.. There was focal slowing located over  the right posterior quadrant.. Frequent interictal epileptiform  discharges were seen over right posterior quadrant. Occasionaly  they would appear in the midline but they predominantly  lateralized to the right. These would wax and wane, and at times  were periodic.SABRA There were no electrographic seizures identified.   Photic stimulation did not activate abnormaliey.  Hyperventilation was not performed.  Impression: This EEG was obtained while obtunded and is abnormal  due to: 1) prominent right posterior quadrant epileptiform  discharges; 2) moderate to severe diffuse slowing; 3)  superimposed focal slowing in right posterior quadrant..   Clinical  Correlation: The findings are consistent with an active  interictal epileptiform focus in the rigtht posterior quadrant  accompanied by prominent diffuse encephalopathy.   Joan DELENA HAZEL, MD [facility] 01/26/2015 1:37 PM   EEG Tech Information Sheet  Patient Name: Joan [name]   MRN: [id number]      Study #: DOS: 01/26/15  Sex: female   Age: 54 y.o.   DOB: 08-15-1960    Study date: 01/07/2015   Acquired On: [place] 2    Technologist: dds  Referring Physician: Melony Munroe, MD [address] [place], [place] [address]     Patient Location:  inpatient  Study Type: with video  No current facility-administered medications for this encounter.    History:   Technologists Impression:    Medications pertinent to Study:    Photic: yes              Hyperventilation: no  Pain Scale:   Other Diagnostic Findings:  Skull Defect:   Joan [name]       Recent Labs Lab 01/25/15 0359 01/26/15 0350 01/27/15 0400  NA 137 140 138  K 4.1 3.7 3.6  CL 107 109* 107  CO2 25 24 24   BUN 19 15 16    CREATININE 0.4 0.4 0.4  CALCIUM  8.4* 8.3* 8.3*  GLUCOSE 102 93 92     Recent Labs Lab 01/25/15 0359 01/26/15 0350 01/27/15 0400  WBC 12.9* 13.5* 11.9*  HGB 9.0* 8.3* 8.2*  HCT 28.5* 26.5* 26.4*  PLT 301 304 292    No results for input(s): HGBA1C in the last 168 hours. No results for input(s): LDL in the last 168 hours.     No results for input(s): HDL in the last 168 hours.    Assessment and Plan  Joan [name] is a 54 y.o. female with a neuro degenerative disorder possibly see see GED.  MRI unchanged from July, 2016 MRI at [facility].  She has had a paraneoplastic workup.  EEG did not show ongoing seizure activity to explain her altered mental status.  She likely has a very poor prognosis for meaningful cognitive recovery. I personally performed the service.  (TP)  JUSTIN T MHOON, MD  Portions of this note were created by voice recognition software and will most likely contain typographical errors.  This note will not be shared in MyChart

## 2015-06-29 ENCOUNTER — Other Ambulatory Visit: Payer: Self-pay | Admitting: Neurology

## 2015-09-28 DEATH — deceased

## 2017-03-13 NOTE — Telephone Encounter (Signed)
Close Encounter
